# Patient Record
Sex: Female | Born: 1935
Health system: Southern US, Community
[De-identification: ages and names within clinical notes are randomized; demographics above are authoritative.]

## PROBLEM LIST (undated history)

## (undated) DIAGNOSIS — I219 Acute myocardial infarction, unspecified: Secondary | ICD-10-CM

## (undated) DIAGNOSIS — K5790 Diverticulosis of intestine, part unspecified, without perforation or abscess without bleeding: Secondary | ICD-10-CM

## (undated) DIAGNOSIS — M353 Polymyalgia rheumatica: Secondary | ICD-10-CM

## (undated) DIAGNOSIS — Z87442 Personal history of urinary calculi: Secondary | ICD-10-CM

## (undated) DIAGNOSIS — S22079A Unspecified fracture of T9-T10 vertebra, initial encounter for closed fracture: Secondary | ICD-10-CM

## (undated) DIAGNOSIS — I10 Essential (primary) hypertension: Secondary | ICD-10-CM

## (undated) DIAGNOSIS — E039 Hypothyroidism, unspecified: Secondary | ICD-10-CM

## (undated) DIAGNOSIS — A159 Respiratory tuberculosis unspecified: Secondary | ICD-10-CM

## (undated) DIAGNOSIS — M1990 Unspecified osteoarthritis, unspecified site: Secondary | ICD-10-CM

## (undated) DIAGNOSIS — I429 Cardiomyopathy, unspecified: Secondary | ICD-10-CM

## (undated) DIAGNOSIS — J449 Chronic obstructive pulmonary disease, unspecified: Secondary | ICD-10-CM

## (undated) DIAGNOSIS — E119 Type 2 diabetes mellitus without complications: Secondary | ICD-10-CM

## (undated) DIAGNOSIS — I428 Other cardiomyopathies: Secondary | ICD-10-CM

## (undated) DIAGNOSIS — K219 Gastro-esophageal reflux disease without esophagitis: Secondary | ICD-10-CM

## (undated) DIAGNOSIS — Z5189 Encounter for other specified aftercare: Secondary | ICD-10-CM

## (undated) DIAGNOSIS — I639 Cerebral infarction, unspecified: Secondary | ICD-10-CM

## (undated) DIAGNOSIS — IMO0001 Reserved for inherently not codable concepts without codable children: Secondary | ICD-10-CM

## (undated) DIAGNOSIS — I509 Heart failure, unspecified: Secondary | ICD-10-CM

## (undated) HISTORY — DX: Respiratory tuberculosis unspecified: A15.9

## (undated) HISTORY — DX: Chronic obstructive pulmonary disease, unspecified: J44.9

## (undated) HISTORY — DX: Other cardiomyopathies: I42.8

## (undated) HISTORY — DX: Diverticulosis of intestine, part unspecified, without perforation or abscess without bleeding: K57.90

## (undated) HISTORY — DX: Cardiomyopathy, unspecified: I42.9

## (undated) HISTORY — DX: Polymyalgia rheumatica: M35.3

## (undated) HISTORY — DX: Gastro-esophageal reflux disease without esophagitis: K21.9

## (undated) HISTORY — PX: CARDIOVASCULAR STRESS TEST: SHX262

## (undated) HISTORY — DX: Essential (primary) hypertension: I10

---
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
## 1959-01-05 HISTORY — PX: TUBAL LIGATION: SHX77

## 1968-02-03 DIAGNOSIS — A159 Respiratory tuberculosis unspecified: Secondary | ICD-10-CM

## 1968-02-03 HISTORY — DX: Respiratory tuberculosis unspecified: A15.9

## 1997-02-02 DIAGNOSIS — I219 Acute myocardial infarction, unspecified: Secondary | ICD-10-CM

## 1997-02-02 HISTORY — PX: CORONARY ANGIOPLASTY WITH STENT PLACEMENT: SHX49

## 1997-02-02 HISTORY — PX: TRACHEOSTOMY: SUR1362

## 1997-02-02 HISTORY — DX: Acute myocardial infarction, unspecified: I21.9

## 1997-05-23 ENCOUNTER — Encounter: Admission: RE | Admit: 1997-05-23 | Discharge: 1997-05-23 | Payer: Self-pay | Admitting: Internal Medicine

## 1997-05-30 ENCOUNTER — Encounter: Admission: RE | Admit: 1997-05-30 | Discharge: 1997-05-30 | Payer: Self-pay | Admitting: Hematology and Oncology

## 1997-06-28 ENCOUNTER — Encounter: Admission: RE | Admit: 1997-06-28 | Discharge: 1997-06-28 | Payer: Self-pay | Admitting: Internal Medicine

## 1997-12-14 ENCOUNTER — Encounter: Admission: RE | Admit: 1997-12-14 | Discharge: 1997-12-14 | Payer: Self-pay | Admitting: Internal Medicine

## 1998-05-14 ENCOUNTER — Encounter: Admission: RE | Admit: 1998-05-14 | Discharge: 1998-05-14 | Payer: Self-pay | Admitting: Internal Medicine

## 1998-06-12 ENCOUNTER — Encounter: Admission: RE | Admit: 1998-06-12 | Discharge: 1998-06-12 | Payer: Self-pay | Admitting: Internal Medicine

## 1998-07-19 ENCOUNTER — Emergency Department (HOSPITAL_COMMUNITY): Admission: EM | Admit: 1998-07-19 | Discharge: 1998-07-19 | Payer: Self-pay | Admitting: Emergency Medicine

## 1998-07-19 ENCOUNTER — Encounter: Payer: Self-pay | Admitting: Emergency Medicine

## 1998-09-23 ENCOUNTER — Encounter: Admission: RE | Admit: 1998-09-23 | Discharge: 1998-09-23 | Payer: Self-pay | Admitting: Internal Medicine

## 1998-09-23 ENCOUNTER — Ambulatory Visit (HOSPITAL_COMMUNITY): Admission: RE | Admit: 1998-09-23 | Discharge: 1998-09-23 | Payer: Self-pay

## 1998-10-01 ENCOUNTER — Encounter: Payer: Self-pay | Admitting: Emergency Medicine

## 1998-10-01 ENCOUNTER — Emergency Department (HOSPITAL_COMMUNITY): Admission: EM | Admit: 1998-10-01 | Discharge: 1998-10-01 | Payer: Self-pay | Admitting: Emergency Medicine

## 1998-10-04 DIAGNOSIS — E119 Type 2 diabetes mellitus without complications: Secondary | ICD-10-CM

## 1998-10-04 DIAGNOSIS — E1169 Type 2 diabetes mellitus with other specified complication: Secondary | ICD-10-CM | POA: Insufficient documentation

## 1998-10-04 HISTORY — DX: Type 2 diabetes mellitus without complications: E11.9

## 1998-10-09 ENCOUNTER — Ambulatory Visit (HOSPITAL_COMMUNITY): Admission: RE | Admit: 1998-10-09 | Discharge: 1998-10-09 | Payer: Self-pay

## 1998-10-21 ENCOUNTER — Encounter: Admission: RE | Admit: 1998-10-21 | Discharge: 1998-10-21 | Payer: Self-pay | Admitting: Internal Medicine

## 1998-10-29 ENCOUNTER — Encounter: Admission: RE | Admit: 1998-10-29 | Discharge: 1999-01-27 | Payer: Self-pay

## 1998-11-01 ENCOUNTER — Ambulatory Visit (HOSPITAL_COMMUNITY): Admission: RE | Admit: 1998-11-01 | Discharge: 1998-11-01 | Payer: Self-pay

## 1999-03-03 ENCOUNTER — Encounter: Admission: RE | Admit: 1999-03-03 | Discharge: 1999-03-03 | Payer: Self-pay | Admitting: Internal Medicine

## 1999-03-07 ENCOUNTER — Encounter: Payer: Self-pay | Admitting: Emergency Medicine

## 1999-03-07 ENCOUNTER — Emergency Department (HOSPITAL_COMMUNITY): Admission: EM | Admit: 1999-03-07 | Discharge: 1999-03-07 | Payer: Self-pay | Admitting: Emergency Medicine

## 1999-05-19 ENCOUNTER — Encounter: Admission: RE | Admit: 1999-05-19 | Discharge: 1999-05-19 | Payer: Self-pay | Admitting: Internal Medicine

## 1999-08-12 ENCOUNTER — Encounter (INDEPENDENT_AMBULATORY_CARE_PROVIDER_SITE_OTHER): Payer: Self-pay | Admitting: Internal Medicine

## 1999-08-12 ENCOUNTER — Encounter: Admission: RE | Admit: 1999-08-12 | Discharge: 1999-08-12 | Payer: Self-pay | Admitting: Hematology and Oncology

## 1999-08-12 LAB — CONVERTED CEMR LAB: Pap Smear: NORMAL

## 1999-08-19 ENCOUNTER — Encounter: Admission: RE | Admit: 1999-08-19 | Discharge: 1999-08-19 | Payer: Self-pay | Admitting: Internal Medicine

## 1999-09-25 ENCOUNTER — Encounter: Admission: RE | Admit: 1999-09-25 | Discharge: 1999-09-25 | Payer: Self-pay | Admitting: Hematology and Oncology

## 1999-10-27 ENCOUNTER — Encounter: Admission: RE | Admit: 1999-10-27 | Discharge: 1999-10-27 | Payer: Self-pay | Admitting: Internal Medicine

## 1999-11-04 LAB — FECAL OCCULT BLOOD, GUAIAC: Fecal Occult Blood: NEGATIVE

## 1999-11-10 ENCOUNTER — Ambulatory Visit (HOSPITAL_COMMUNITY): Admission: RE | Admit: 1999-11-10 | Discharge: 1999-11-10 | Payer: Self-pay

## 2000-03-23 ENCOUNTER — Inpatient Hospital Stay (HOSPITAL_COMMUNITY): Admission: EM | Admit: 2000-03-23 | Discharge: 2000-03-24 | Payer: Self-pay | Admitting: Emergency Medicine

## 2000-03-23 ENCOUNTER — Encounter: Payer: Self-pay | Admitting: *Deleted

## 2000-03-24 ENCOUNTER — Encounter: Payer: Self-pay | Admitting: Internal Medicine

## 2000-05-28 ENCOUNTER — Inpatient Hospital Stay (HOSPITAL_COMMUNITY): Admission: EM | Admit: 2000-05-28 | Discharge: 2000-05-29 | Payer: Self-pay | Admitting: Emergency Medicine

## 2000-05-28 ENCOUNTER — Encounter: Payer: Self-pay | Admitting: Emergency Medicine

## 2000-06-29 ENCOUNTER — Encounter: Admission: RE | Admit: 2000-06-29 | Discharge: 2000-06-29 | Payer: Self-pay

## 2001-03-18 ENCOUNTER — Encounter: Admission: RE | Admit: 2001-03-18 | Discharge: 2001-03-18 | Payer: Self-pay | Admitting: Internal Medicine

## 2001-04-25 ENCOUNTER — Encounter: Admission: RE | Admit: 2001-04-25 | Discharge: 2001-04-25 | Payer: Self-pay | Admitting: Internal Medicine

## 2001-04-26 ENCOUNTER — Encounter: Admission: RE | Admit: 2001-04-26 | Discharge: 2001-04-26 | Payer: Self-pay | Admitting: Internal Medicine

## 2001-05-05 ENCOUNTER — Ambulatory Visit (HOSPITAL_COMMUNITY): Admission: RE | Admit: 2001-05-05 | Discharge: 2001-05-05 | Payer: Self-pay | Admitting: Internal Medicine

## 2001-06-08 ENCOUNTER — Encounter: Admission: RE | Admit: 2001-06-08 | Discharge: 2001-06-08 | Payer: Self-pay | Admitting: Internal Medicine

## 2001-06-09 ENCOUNTER — Encounter: Admission: RE | Admit: 2001-06-09 | Discharge: 2001-06-09 | Payer: Self-pay | Admitting: Internal Medicine

## 2001-07-15 ENCOUNTER — Encounter: Admission: RE | Admit: 2001-07-15 | Discharge: 2001-07-15 | Payer: Self-pay | Admitting: Internal Medicine

## 2001-07-15 ENCOUNTER — Encounter: Payer: Self-pay | Admitting: Internal Medicine

## 2001-07-15 ENCOUNTER — Ambulatory Visit (HOSPITAL_COMMUNITY): Admission: RE | Admit: 2001-07-15 | Discharge: 2001-07-15 | Payer: Self-pay | Admitting: Internal Medicine

## 2001-07-19 ENCOUNTER — Encounter: Admission: RE | Admit: 2001-07-19 | Discharge: 2001-07-19 | Payer: Self-pay | Admitting: Internal Medicine

## 2001-07-22 ENCOUNTER — Encounter: Admission: RE | Admit: 2001-07-22 | Discharge: 2001-07-22 | Payer: Self-pay | Admitting: Internal Medicine

## 2001-07-26 ENCOUNTER — Encounter: Payer: Self-pay | Admitting: Internal Medicine

## 2001-07-26 ENCOUNTER — Ambulatory Visit (HOSPITAL_COMMUNITY): Admission: RE | Admit: 2001-07-26 | Discharge: 2001-07-26 | Payer: Self-pay | Admitting: Internal Medicine

## 2001-08-10 ENCOUNTER — Encounter: Admission: RE | Admit: 2001-08-10 | Discharge: 2001-08-10 | Payer: Self-pay | Admitting: Internal Medicine

## 2001-08-23 ENCOUNTER — Ambulatory Visit (HOSPITAL_COMMUNITY): Admission: RE | Admit: 2001-08-23 | Discharge: 2001-08-23 | Payer: Self-pay | Admitting: Internal Medicine

## 2001-08-23 ENCOUNTER — Encounter: Payer: Self-pay | Admitting: Internal Medicine

## 2001-09-07 ENCOUNTER — Encounter (HOSPITAL_COMMUNITY): Payer: Self-pay | Admitting: Oncology

## 2001-09-07 ENCOUNTER — Encounter: Admission: RE | Admit: 2001-09-07 | Discharge: 2001-09-07 | Payer: Self-pay | Admitting: Oncology

## 2001-10-04 ENCOUNTER — Encounter: Admission: RE | Admit: 2001-10-04 | Discharge: 2001-10-04 | Payer: Self-pay | Admitting: Internal Medicine

## 2001-10-18 ENCOUNTER — Encounter: Admission: RE | Admit: 2001-10-18 | Discharge: 2001-10-18 | Payer: Self-pay | Admitting: Internal Medicine

## 2001-11-17 ENCOUNTER — Encounter: Admission: RE | Admit: 2001-11-17 | Discharge: 2001-11-17 | Payer: Self-pay | Admitting: Internal Medicine

## 2001-11-23 ENCOUNTER — Encounter: Admission: RE | Admit: 2001-11-23 | Discharge: 2001-11-23 | Payer: Self-pay | Admitting: Internal Medicine

## 2001-12-21 ENCOUNTER — Encounter: Payer: Self-pay | Admitting: Emergency Medicine

## 2001-12-21 ENCOUNTER — Emergency Department (HOSPITAL_COMMUNITY): Admission: EM | Admit: 2001-12-21 | Discharge: 2001-12-21 | Payer: Self-pay | Admitting: Emergency Medicine

## 2002-03-14 ENCOUNTER — Encounter: Admission: RE | Admit: 2002-03-14 | Discharge: 2002-03-14 | Payer: Self-pay | Admitting: Internal Medicine

## 2002-08-30 ENCOUNTER — Encounter: Admission: RE | Admit: 2002-08-30 | Discharge: 2002-08-30 | Payer: Self-pay | Admitting: Internal Medicine

## 2002-11-14 ENCOUNTER — Encounter: Payer: Self-pay | Admitting: *Deleted

## 2002-11-14 ENCOUNTER — Emergency Department (HOSPITAL_COMMUNITY): Admission: EM | Admit: 2002-11-14 | Discharge: 2002-11-14 | Payer: Self-pay | Admitting: *Deleted

## 2002-11-22 ENCOUNTER — Encounter: Admission: RE | Admit: 2002-11-22 | Discharge: 2002-11-22 | Payer: Self-pay | Admitting: Internal Medicine

## 2002-12-21 ENCOUNTER — Encounter: Admission: RE | Admit: 2002-12-21 | Discharge: 2002-12-21 | Payer: Self-pay | Admitting: Internal Medicine

## 2003-01-01 ENCOUNTER — Encounter: Admission: RE | Admit: 2003-01-01 | Discharge: 2003-01-01 | Payer: Self-pay | Admitting: Internal Medicine

## 2003-01-03 ENCOUNTER — Ambulatory Visit (HOSPITAL_COMMUNITY): Admission: RE | Admit: 2003-01-03 | Discharge: 2003-01-03 | Payer: Self-pay | Admitting: *Deleted

## 2003-01-11 LAB — HM COLONOSCOPY: HM Colonoscopy: ABNORMAL

## 2003-06-14 ENCOUNTER — Encounter: Admission: RE | Admit: 2003-06-14 | Discharge: 2003-06-14 | Payer: Self-pay | Admitting: Internal Medicine

## 2003-08-30 ENCOUNTER — Emergency Department (HOSPITAL_COMMUNITY): Admission: EM | Admit: 2003-08-30 | Discharge: 2003-08-30 | Payer: Self-pay | Admitting: Emergency Medicine

## 2003-12-04 ENCOUNTER — Ambulatory Visit: Payer: Self-pay | Admitting: Internal Medicine

## 2003-12-18 ENCOUNTER — Ambulatory Visit: Payer: Self-pay | Admitting: Internal Medicine

## 2004-02-13 ENCOUNTER — Ambulatory Visit: Payer: Self-pay | Admitting: Internal Medicine

## 2004-02-27 ENCOUNTER — Ambulatory Visit: Payer: Self-pay | Admitting: Internal Medicine

## 2004-02-27 ENCOUNTER — Emergency Department (HOSPITAL_COMMUNITY): Admission: EM | Admit: 2004-02-27 | Discharge: 2004-02-27 | Payer: Self-pay

## 2005-04-17 ENCOUNTER — Ambulatory Visit: Payer: Self-pay | Admitting: Internal Medicine

## 2005-08-22 ENCOUNTER — Emergency Department (HOSPITAL_COMMUNITY): Admission: AD | Admit: 2005-08-22 | Discharge: 2005-08-23 | Payer: Self-pay | Admitting: Family Medicine

## 2005-09-23 ENCOUNTER — Ambulatory Visit: Payer: Self-pay | Admitting: Internal Medicine

## 2005-11-30 ENCOUNTER — Ambulatory Visit (HOSPITAL_COMMUNITY): Admission: RE | Admit: 2005-11-30 | Discharge: 2005-11-30 | Payer: Self-pay | Admitting: Internal Medicine

## 2005-11-30 ENCOUNTER — Ambulatory Visit: Payer: Self-pay | Admitting: Internal Medicine

## 2005-12-21 ENCOUNTER — Ambulatory Visit: Payer: Self-pay | Admitting: Internal Medicine

## 2006-01-21 ENCOUNTER — Encounter (INDEPENDENT_AMBULATORY_CARE_PROVIDER_SITE_OTHER): Payer: Self-pay | Admitting: Internal Medicine

## 2006-01-21 DIAGNOSIS — F32A Depression, unspecified: Secondary | ICD-10-CM | POA: Insufficient documentation

## 2006-01-21 DIAGNOSIS — E785 Hyperlipidemia, unspecified: Secondary | ICD-10-CM | POA: Insufficient documentation

## 2006-01-21 DIAGNOSIS — E039 Hypothyroidism, unspecified: Secondary | ICD-10-CM | POA: Insufficient documentation

## 2006-01-21 DIAGNOSIS — F329 Major depressive disorder, single episode, unspecified: Secondary | ICD-10-CM | POA: Insufficient documentation

## 2006-01-21 DIAGNOSIS — I420 Dilated cardiomyopathy: Secondary | ICD-10-CM | POA: Insufficient documentation

## 2006-01-21 DIAGNOSIS — Z8611 Personal history of tuberculosis: Secondary | ICD-10-CM | POA: Insufficient documentation

## 2006-01-21 DIAGNOSIS — I10 Essential (primary) hypertension: Secondary | ICD-10-CM | POA: Insufficient documentation

## 2006-01-21 DIAGNOSIS — M1990 Unspecified osteoarthritis, unspecified site: Secondary | ICD-10-CM | POA: Insufficient documentation

## 2006-01-21 DIAGNOSIS — J449 Chronic obstructive pulmonary disease, unspecified: Secondary | ICD-10-CM | POA: Insufficient documentation

## 2006-01-21 DIAGNOSIS — K219 Gastro-esophageal reflux disease without esophagitis: Secondary | ICD-10-CM | POA: Insufficient documentation

## 2006-01-21 DIAGNOSIS — M353 Polymyalgia rheumatica: Secondary | ICD-10-CM | POA: Insufficient documentation

## 2006-03-05 ENCOUNTER — Telehealth (INDEPENDENT_AMBULATORY_CARE_PROVIDER_SITE_OTHER): Payer: Self-pay | Admitting: *Deleted

## 2006-03-17 ENCOUNTER — Telehealth (INDEPENDENT_AMBULATORY_CARE_PROVIDER_SITE_OTHER): Payer: Self-pay | Admitting: *Deleted

## 2006-04-02 ENCOUNTER — Telehealth: Payer: Self-pay | Admitting: *Deleted

## 2006-04-22 ENCOUNTER — Ambulatory Visit: Payer: Self-pay | Admitting: Internal Medicine

## 2006-04-22 DIAGNOSIS — H6120 Impacted cerumen, unspecified ear: Secondary | ICD-10-CM | POA: Insufficient documentation

## 2006-04-22 DIAGNOSIS — L280 Lichen simplex chronicus: Secondary | ICD-10-CM | POA: Insufficient documentation

## 2006-04-22 DIAGNOSIS — N3946 Mixed incontinence: Secondary | ICD-10-CM | POA: Insufficient documentation

## 2006-04-22 LAB — CONVERTED CEMR LAB
ALT: 9 units/L (ref 0–35)
AST: 12 units/L (ref 0–37)
Albumin: 4.6 g/dL (ref 3.5–5.2)
Alkaline Phosphatase: 114 units/L (ref 39–117)
BUN: 17 mg/dL (ref 6–23)
Bilirubin Urine: NEGATIVE
Bilirubin, Direct: 0.1 mg/dL (ref 0.0–0.3)
Blood Glucose, Fingerstick: 79
Blood in Urine, dipstick: NEGATIVE
CO2: 23 meq/L (ref 19–32)
Calcium: 10.2 mg/dL (ref 8.4–10.5)
Chloride: 105 meq/L (ref 96–112)
Creatinine, Ser: 1.04 mg/dL (ref 0.40–1.20)
Glucose, Bld: 88 mg/dL (ref 70–99)
Glucose, Urine, Semiquant: NEGATIVE
Hgb A1c MFr Bld: 6.6 %
Indirect Bilirubin: 0.4 mg/dL (ref 0.0–0.9)
Ketones, urine, test strip: NEGATIVE
Nitrite: NEGATIVE
Potassium: 4 meq/L (ref 3.5–5.3)
Protein, U semiquant: NEGATIVE
Sodium: 143 meq/L (ref 135–145)
Specific Gravity, Urine: 1.02
TSH: 1.514 microintl units/mL (ref 0.350–5.50)
Total Bilirubin: 0.5 mg/dL (ref 0.3–1.2)
Total Protein: 8 g/dL (ref 6.0–8.3)
Urobilinogen, UA: 0.2
WBC Urine, dipstick: NEGATIVE
pH: 5

## 2006-05-27 ENCOUNTER — Encounter (INDEPENDENT_AMBULATORY_CARE_PROVIDER_SITE_OTHER): Payer: Self-pay | Admitting: Internal Medicine

## 2006-06-29 ENCOUNTER — Emergency Department (HOSPITAL_COMMUNITY): Admission: EM | Admit: 2006-06-29 | Discharge: 2006-06-29 | Payer: Self-pay | Admitting: Family Medicine

## 2006-06-29 ENCOUNTER — Ambulatory Visit: Payer: Self-pay | Admitting: Vascular Surgery

## 2006-06-29 ENCOUNTER — Encounter (INDEPENDENT_AMBULATORY_CARE_PROVIDER_SITE_OTHER): Payer: Self-pay | Admitting: Internal Medicine

## 2006-07-19 ENCOUNTER — Emergency Department (HOSPITAL_COMMUNITY): Admission: EM | Admit: 2006-07-19 | Discharge: 2006-07-19 | Payer: Self-pay | Admitting: Emergency Medicine

## 2006-07-20 ENCOUNTER — Emergency Department (HOSPITAL_COMMUNITY): Admission: EM | Admit: 2006-07-20 | Discharge: 2006-07-20 | Payer: Self-pay | Admitting: Emergency Medicine

## 2006-07-23 ENCOUNTER — Emergency Department (HOSPITAL_COMMUNITY): Admission: EM | Admit: 2006-07-23 | Discharge: 2006-07-23 | Payer: Self-pay | Admitting: Emergency Medicine

## 2006-08-31 ENCOUNTER — Telehealth (INDEPENDENT_AMBULATORY_CARE_PROVIDER_SITE_OTHER): Payer: Self-pay | Admitting: *Deleted

## 2006-09-20 ENCOUNTER — Telehealth (INDEPENDENT_AMBULATORY_CARE_PROVIDER_SITE_OTHER): Payer: Self-pay | Admitting: *Deleted

## 2006-09-24 ENCOUNTER — Telehealth: Payer: Self-pay | Admitting: Infectious Disease

## 2006-10-07 ENCOUNTER — Ambulatory Visit: Payer: Self-pay | Admitting: Internal Medicine

## 2006-10-07 ENCOUNTER — Encounter (INDEPENDENT_AMBULATORY_CARE_PROVIDER_SITE_OTHER): Payer: Self-pay | Admitting: Internal Medicine

## 2006-10-07 DIAGNOSIS — D239 Other benign neoplasm of skin, unspecified: Secondary | ICD-10-CM | POA: Insufficient documentation

## 2006-10-07 LAB — CONVERTED CEMR LAB
ALT: 8 units/L (ref 0–35)
AST: 13 units/L (ref 0–37)
Albumin: 4.4 g/dL (ref 3.5–5.2)
Alkaline Phosphatase: 108 units/L (ref 39–117)
BUN: 13 mg/dL (ref 6–23)
Blood Glucose, Fingerstick: 111
CO2: 27 meq/L (ref 19–32)
Calcium: 9.8 mg/dL (ref 8.4–10.5)
Chloride: 108 meq/L (ref 96–112)
Cholesterol: 185 mg/dL (ref 0–200)
Creatinine, Ser: 0.77 mg/dL (ref 0.40–1.20)
Creatinine, Urine: 190.2 mg/dL
Glucose, Bld: 87 mg/dL (ref 70–99)
HCT: 37.2 % (ref 36.0–46.0)
HDL: 62 mg/dL (ref >39)
Hemoglobin: 12.1 g/dL (ref 12.0–15.0)
Hgb A1c MFr Bld: 6.2 %
LDL Cholesterol: 96 mg/dL (ref 0–99)
MCHC: 32.5 g/dL (ref 30.0–36.0)
MCV: 89.2 fL (ref 78.0–100.0)
Microalb Creat Ratio: 4.9 mg/g (ref 0.0–30.0)
Microalb, Ur: 0.94 mg/dL (ref 0.00–1.89)
Platelets: 197 10*3/uL (ref 150–400)
Potassium: 4.1 meq/L (ref 3.5–5.3)
RBC: 4.17 M/uL (ref 3.87–5.11)
RDW: 15.1 % — ABNORMAL HIGH (ref 11.5–14.0)
Sodium: 147 meq/L — ABNORMAL HIGH (ref 135–145)
TSH: 1.453 microintl units/mL (ref 0.350–5.50)
Total Bilirubin: 0.5 mg/dL (ref 0.3–1.2)
Total CHOL/HDL Ratio: 3
Total Protein: 7.5 g/dL (ref 6.0–8.3)
Triglycerides: 137 mg/dL (ref <150)
VLDL: 27 mg/dL (ref 0–40)
WBC: 5.3 10*3/uL (ref 4.0–10.5)

## 2006-12-22 ENCOUNTER — Telehealth: Payer: Self-pay | Admitting: *Deleted

## 2006-12-28 ENCOUNTER — Telehealth (INDEPENDENT_AMBULATORY_CARE_PROVIDER_SITE_OTHER): Payer: Self-pay | Admitting: Internal Medicine

## 2007-02-03 DIAGNOSIS — I639 Cerebral infarction, unspecified: Secondary | ICD-10-CM

## 2007-02-03 HISTORY — DX: Cerebral infarction, unspecified: I63.9

## 2007-02-17 ENCOUNTER — Encounter (INDEPENDENT_AMBULATORY_CARE_PROVIDER_SITE_OTHER): Payer: Self-pay | Admitting: Internal Medicine

## 2007-02-17 ENCOUNTER — Ambulatory Visit: Payer: Self-pay | Admitting: Internal Medicine

## 2007-02-17 LAB — CONVERTED CEMR LAB
BUN: 9 mg/dL (ref 6–23)
Blood Glucose, Fingerstick: 104
CO2: 29 meq/L (ref 19–32)
Calcium: 9.7 mg/dL (ref 8.4–10.5)
Chloride: 104 meq/L (ref 96–112)
Creatinine, Ser: 0.82 mg/dL (ref 0.40–1.20)
Glucose, Bld: 91 mg/dL (ref 70–99)
Hgb A1c MFr Bld: 6.1 %
Potassium: 3.7 meq/L (ref 3.5–5.3)
Pro B Natriuretic peptide (BNP): <30 pg/mL (ref 0.0–100.0)
Sodium: 138 meq/L (ref 135–145)

## 2007-05-04 HISTORY — PX: EYE SURGERY: SHX253

## 2007-05-17 ENCOUNTER — Inpatient Hospital Stay (HOSPITAL_COMMUNITY): Admission: EM | Admit: 2007-05-17 | Discharge: 2007-05-26 | Payer: Self-pay | Admitting: Emergency Medicine

## 2007-05-18 ENCOUNTER — Ambulatory Visit: Payer: Self-pay | Admitting: Infectious Diseases

## 2007-05-19 ENCOUNTER — Encounter (INDEPENDENT_AMBULATORY_CARE_PROVIDER_SITE_OTHER): Payer: Self-pay | Admitting: *Deleted

## 2007-07-07 ENCOUNTER — Ambulatory Visit: Payer: Self-pay | Admitting: *Deleted

## 2007-07-07 LAB — CONVERTED CEMR LAB
Blood Glucose, Fingerstick: 185
Hgb A1c MFr Bld: 6.3 %

## 2007-07-08 ENCOUNTER — Encounter: Payer: Self-pay | Admitting: Internal Medicine

## 2007-07-08 ENCOUNTER — Ambulatory Visit (HOSPITAL_COMMUNITY): Admission: RE | Admit: 2007-07-08 | Discharge: 2007-07-08 | Payer: Self-pay | Admitting: *Deleted

## 2007-08-04 ENCOUNTER — Emergency Department (HOSPITAL_COMMUNITY): Admission: EM | Admit: 2007-08-04 | Discharge: 2007-08-04 | Payer: Self-pay | Admitting: Emergency Medicine

## 2007-09-14 ENCOUNTER — Telehealth: Payer: Self-pay | Admitting: Internal Medicine

## 2007-09-20 ENCOUNTER — Emergency Department (HOSPITAL_COMMUNITY): Admission: EM | Admit: 2007-09-20 | Discharge: 2007-09-20 | Payer: Self-pay | Admitting: Emergency Medicine

## 2007-09-26 ENCOUNTER — Encounter (INDEPENDENT_AMBULATORY_CARE_PROVIDER_SITE_OTHER): Payer: Self-pay | Admitting: Internal Medicine

## 2007-09-27 ENCOUNTER — Ambulatory Visit: Payer: Self-pay | Admitting: Internal Medicine

## 2007-09-27 DIAGNOSIS — R079 Chest pain, unspecified: Secondary | ICD-10-CM | POA: Insufficient documentation

## 2007-09-27 LAB — CONVERTED CEMR LAB
Blood Glucose, Fingerstick: 120
Hgb A1c MFr Bld: 6.6 %

## 2007-10-04 ENCOUNTER — Telehealth: Payer: Self-pay | Admitting: Internal Medicine

## 2007-10-05 ENCOUNTER — Telehealth (INDEPENDENT_AMBULATORY_CARE_PROVIDER_SITE_OTHER): Payer: Self-pay | Admitting: Internal Medicine

## 2007-10-11 ENCOUNTER — Ambulatory Visit (HOSPITAL_COMMUNITY): Admission: RE | Admit: 2007-10-11 | Discharge: 2007-10-11 | Payer: Self-pay | Admitting: Ophthalmology

## 2007-11-02 ENCOUNTER — Encounter: Payer: Self-pay | Admitting: Internal Medicine

## 2007-11-02 ENCOUNTER — Telehealth: Payer: Self-pay | Admitting: Internal Medicine

## 2007-11-02 ENCOUNTER — Ambulatory Visit: Payer: Self-pay | Admitting: Internal Medicine

## 2007-11-02 LAB — CONVERTED CEMR LAB: Blood Glucose, Fingerstick: 96

## 2007-11-03 ENCOUNTER — Telehealth: Payer: Self-pay | Admitting: Internal Medicine

## 2007-11-03 HISTORY — PX: CATARACT EXTRACTION W/ INTRAOCULAR LENS IMPLANT: SHX1309

## 2007-11-29 ENCOUNTER — Telehealth: Payer: Self-pay | Admitting: Internal Medicine

## 2007-12-05 ENCOUNTER — Telehealth: Payer: Self-pay | Admitting: Internal Medicine

## 2007-12-08 ENCOUNTER — Telehealth: Payer: Self-pay | Admitting: Internal Medicine

## 2007-12-15 ENCOUNTER — Encounter: Payer: Self-pay | Admitting: Internal Medicine

## 2007-12-15 ENCOUNTER — Ambulatory Visit: Payer: Self-pay | Admitting: *Deleted

## 2007-12-15 LAB — CONVERTED CEMR LAB
ALT: 8 units/L (ref 0–35)
AST: 13 units/L (ref 0–37)
Albumin: 4.1 g/dL (ref 3.5–5.2)
Alkaline Phosphatase: 100 units/L (ref 39–117)
BUN: 10 mg/dL (ref 6–23)
Bilirubin, Direct: 0.1 mg/dL (ref 0.0–0.3)
Blood Glucose, Fingerstick: 90
CO2: 26 meq/L (ref 19–32)
Calcium: 9.6 mg/dL (ref 8.4–10.5)
Chloride: 108 meq/L (ref 96–112)
Cholesterol: 155 mg/dL (ref 0–200)
Creatinine, Ser: 0.91 mg/dL (ref 0.40–1.20)
Creatinine, Urine: 160 mg/dL
Glucose, Bld: 92 mg/dL (ref 70–99)
HDL: 57 mg/dL (ref >39)
Hgb A1c MFr Bld: 6.7 %
Indirect Bilirubin: 0.3 mg/dL (ref 0.0–0.9)
LDL Cholesterol: 75 mg/dL (ref 0–99)
Microalb Creat Ratio: 7.6 mg/g (ref 0.0–30.0)
Microalb, Ur: 1.21 mg/dL (ref 0.00–1.89)
Potassium: 4.4 meq/L (ref 3.5–5.3)
Sodium: 144 meq/L (ref 135–145)
Total Bilirubin: 0.4 mg/dL (ref 0.3–1.2)
Total CHOL/HDL Ratio: 2.7
Total Protein: 7.1 g/dL (ref 6.0–8.3)
Triglycerides: 115 mg/dL (ref <150)
VLDL: 23 mg/dL (ref 0–40)

## 2007-12-16 ENCOUNTER — Encounter: Payer: Self-pay | Admitting: Internal Medicine

## 2008-02-03 HISTORY — PX: US ECHOCARDIOGRAPHY: HXRAD669

## 2008-02-03 HISTORY — PX: CORONARY ANGIOPLASTY WITH STENT PLACEMENT: SHX49

## 2008-02-06 ENCOUNTER — Telehealth: Payer: Self-pay | Admitting: Internal Medicine

## 2008-02-06 ENCOUNTER — Telehealth: Payer: Self-pay | Admitting: *Deleted

## 2008-03-27 ENCOUNTER — Telehealth: Payer: Self-pay | Admitting: Internal Medicine

## 2008-04-27 ENCOUNTER — Encounter: Payer: Self-pay | Admitting: Internal Medicine

## 2008-04-27 ENCOUNTER — Ambulatory Visit: Payer: Self-pay | Admitting: *Deleted

## 2008-04-27 LAB — CONVERTED CEMR LAB
BUN: 11 mg/dL (ref 6–23)
Blood Glucose, Fingerstick: 151
CO2: 25 meq/L (ref 19–32)
Calcium: 9.8 mg/dL (ref 8.4–10.5)
Chloride: 108 meq/L (ref 96–112)
Cholesterol, target level: 200 mg/dL
Creatinine, Ser: 0.82 mg/dL (ref 0.40–1.20)
GFR calc Af Amer: >60 mL/min (ref >60)
GFR calc non Af Amer: >60 mL/min (ref >60)
Glucose, Bld: 153 mg/dL — ABNORMAL HIGH (ref 70–99)
HDL goal, serum: 40 mg/dL
Hgb A1c MFr Bld: 6.7 %
LDL Goal: 100 mg/dL
Potassium: 4.1 meq/L (ref 3.5–5.3)
Sodium: 145 meq/L (ref 135–145)
TSH: 1.431 microintl units/mL (ref 0.350–4.500)

## 2008-05-01 ENCOUNTER — Encounter: Payer: Self-pay | Admitting: Internal Medicine

## 2008-05-23 ENCOUNTER — Encounter: Payer: Self-pay | Admitting: Internal Medicine

## 2008-07-24 ENCOUNTER — Telehealth: Payer: Self-pay | Admitting: Internal Medicine

## 2008-08-21 ENCOUNTER — Ambulatory Visit: Payer: Self-pay | Admitting: Internal Medicine

## 2008-08-21 ENCOUNTER — Encounter (INDEPENDENT_AMBULATORY_CARE_PROVIDER_SITE_OTHER): Payer: Self-pay | Admitting: Internal Medicine

## 2008-08-21 ENCOUNTER — Telehealth: Payer: Self-pay | Admitting: *Deleted

## 2008-08-21 DIAGNOSIS — Z8669 Personal history of other diseases of the nervous system and sense organs: Secondary | ICD-10-CM | POA: Insufficient documentation

## 2008-08-21 LAB — CONVERTED CEMR LAB
Blood Glucose, Fingerstick: 175
Hgb A1c MFr Bld: 7.7 %

## 2008-08-22 ENCOUNTER — Encounter (INDEPENDENT_AMBULATORY_CARE_PROVIDER_SITE_OTHER): Payer: Self-pay | Admitting: Internal Medicine

## 2008-08-22 ENCOUNTER — Ambulatory Visit: Payer: Self-pay | Admitting: *Deleted

## 2008-08-22 ENCOUNTER — Ambulatory Visit: Payer: Self-pay | Admitting: Internal Medicine

## 2008-08-22 ENCOUNTER — Encounter: Payer: Self-pay | Admitting: *Deleted

## 2008-08-22 ENCOUNTER — Encounter: Payer: Self-pay | Admitting: Internal Medicine

## 2008-08-22 LAB — CONVERTED CEMR LAB
Cholesterol: 162 mg/dL
HDL: 56 mg/dL
LDL Cholesterol: 90 mg/dL
Triglycerides: 81 mg/dL

## 2008-08-23 ENCOUNTER — Inpatient Hospital Stay (HOSPITAL_COMMUNITY): Admission: AD | Admit: 2008-08-23 | Discharge: 2008-08-25 | Payer: Self-pay | Admitting: Internal Medicine

## 2008-08-25 ENCOUNTER — Encounter (INDEPENDENT_AMBULATORY_CARE_PROVIDER_SITE_OTHER): Payer: Self-pay | Admitting: Internal Medicine

## 2008-09-04 ENCOUNTER — Ambulatory Visit: Payer: Self-pay | Admitting: Internal Medicine

## 2008-09-14 ENCOUNTER — Encounter: Payer: Self-pay | Admitting: *Deleted

## 2008-09-21 ENCOUNTER — Ambulatory Visit (HOSPITAL_COMMUNITY): Admission: RE | Admit: 2008-09-21 | Discharge: 2008-09-21 | Payer: Self-pay | Admitting: Cardiovascular Disease

## 2008-10-25 ENCOUNTER — Ambulatory Visit: Payer: Self-pay | Admitting: Infectious Diseases

## 2008-10-25 ENCOUNTER — Encounter: Payer: Self-pay | Admitting: Internal Medicine

## 2008-10-25 ENCOUNTER — Observation Stay (HOSPITAL_COMMUNITY): Admission: EM | Admit: 2008-10-25 | Discharge: 2008-10-26 | Payer: Self-pay | Admitting: Emergency Medicine

## 2008-10-26 ENCOUNTER — Encounter: Payer: Self-pay | Admitting: Internal Medicine

## 2008-11-05 ENCOUNTER — Ambulatory Visit: Payer: Self-pay | Admitting: Internal Medicine

## 2008-11-05 LAB — CONVERTED CEMR LAB
BUN: 9 mg/dL (ref 6–23)
CO2: 31 meq/L (ref 19–32)
Calcium: 9.7 mg/dL (ref 8.4–10.5)
Chloride: 106 meq/L (ref 96–112)
Creatinine, Ser: 0.75 mg/dL (ref 0.40–1.20)
Glucose, Bld: 144 mg/dL — ABNORMAL HIGH (ref 70–99)
Potassium: 3.7 meq/L (ref 3.5–5.3)
Sodium: 141 meq/L (ref 135–145)

## 2008-11-19 ENCOUNTER — Ambulatory Visit (HOSPITAL_COMMUNITY): Admission: RE | Admit: 2008-11-19 | Discharge: 2008-11-19 | Payer: Self-pay | Admitting: Internal Medicine

## 2008-11-19 ENCOUNTER — Encounter: Payer: Self-pay | Admitting: Internal Medicine

## 2008-11-19 LAB — HM MAMMOGRAPHY: HM Mammogram: NEGATIVE

## 2009-04-05 ENCOUNTER — Telehealth: Payer: Self-pay | Admitting: Internal Medicine

## 2009-04-26 ENCOUNTER — Ambulatory Visit: Payer: Self-pay | Admitting: Internal Medicine

## 2009-04-26 DIAGNOSIS — R252 Cramp and spasm: Secondary | ICD-10-CM | POA: Insufficient documentation

## 2009-04-26 LAB — CONVERTED CEMR LAB
ALT: <8 units/L (ref 0–35)
AST: 12 units/L (ref 0–37)
Albumin: 4.3 g/dL (ref 3.5–5.2)
Alkaline Phosphatase: 88 units/L (ref 39–117)
BUN: 10 mg/dL (ref 6–23)
Blood Glucose, Fingerstick: 149
CO2: 26 meq/L (ref 19–32)
Calcium: 9.6 mg/dL (ref 8.4–10.5)
Chloride: 103 meq/L (ref 96–112)
Creatinine, Ser: 0.78 mg/dL (ref 0.40–1.20)
Free T4: 1.13 ng/dL (ref 0.80–1.80)
Glucose, Bld: 109 mg/dL — ABNORMAL HIGH (ref 70–99)
Hgb A1c MFr Bld: 7.7 %
Potassium: 3.6 meq/L (ref 3.5–5.3)
Sodium: 144 meq/L (ref 135–145)
TSH: 0.855 microintl units/mL (ref 0.350–4.5)
Total Bilirubin: 0.6 mg/dL (ref 0.3–1.2)
Total Protein: 7.2 g/dL (ref 6.0–8.3)

## 2009-08-12 ENCOUNTER — Telehealth: Payer: Self-pay | Admitting: Internal Medicine

## 2009-08-21 ENCOUNTER — Encounter: Payer: Self-pay | Admitting: Internal Medicine

## 2009-08-21 ENCOUNTER — Encounter: Payer: Self-pay | Admitting: *Deleted

## 2009-08-21 ENCOUNTER — Ambulatory Visit: Payer: Self-pay | Admitting: Internal Medicine

## 2009-08-21 DIAGNOSIS — L538 Other specified erythematous conditions: Secondary | ICD-10-CM | POA: Insufficient documentation

## 2009-08-21 LAB — CONVERTED CEMR LAB
Blood Glucose, Fingerstick: 198
Hgb A1c MFr Bld: 7.8 %

## 2009-08-22 LAB — CONVERTED CEMR LAB
ALT: 8 units/L (ref 0–35)
AST: 14 units/L (ref 0–37)
Albumin: 4.2 g/dL (ref 3.5–5.2)
Alkaline Phosphatase: 110 units/L (ref 39–117)
BUN: 10 mg/dL (ref 6–23)
CO2: 26 meq/L (ref 19–32)
Calcium: 9.8 mg/dL (ref 8.4–10.5)
Chloride: 107 meq/L (ref 96–112)
Cholesterol: 185 mg/dL (ref 0–200)
Creatinine, Ser: 0.68 mg/dL (ref 0.40–1.20)
Creatinine, Urine: 158.4 mg/dL
Glucose, Bld: 170 mg/dL — ABNORMAL HIGH (ref 70–99)
HDL: 51 mg/dL (ref >39)
LDL Cholesterol: 109 mg/dL — ABNORMAL HIGH (ref 0–99)
Microalb Creat Ratio: 98.1 mg/g — ABNORMAL HIGH (ref 0.0–30.0)
Microalb, Ur: 15.54 mg/dL — ABNORMAL HIGH (ref 0.00–1.89)
Potassium: 3.3 meq/L — ABNORMAL LOW (ref 3.5–5.3)
Sodium: 145 meq/L (ref 135–145)
TSH: 1.508 microintl units/mL (ref 0.350–4.5)
Total Bilirubin: 0.8 mg/dL (ref 0.3–1.2)
Total CHOL/HDL Ratio: 3.6
Total Protein: 7.3 g/dL (ref 6.0–8.3)
Triglycerides: 124 mg/dL (ref <150)
VLDL: 25 mg/dL (ref 0–40)

## 2009-09-02 ENCOUNTER — Emergency Department (HOSPITAL_COMMUNITY): Admission: EM | Admit: 2009-09-02 | Discharge: 2009-09-02 | Payer: Self-pay | Admitting: Emergency Medicine

## 2009-09-04 ENCOUNTER — Ambulatory Visit: Payer: Self-pay | Admitting: Internal Medicine

## 2009-09-04 DIAGNOSIS — R05 Cough: Secondary | ICD-10-CM

## 2009-09-04 DIAGNOSIS — R930 Abnormal findings on diagnostic imaging of skull and head, not elsewhere classified: Secondary | ICD-10-CM | POA: Insufficient documentation

## 2009-09-04 DIAGNOSIS — Z8639 Personal history of other endocrine, nutritional and metabolic disease: Secondary | ICD-10-CM

## 2009-09-04 DIAGNOSIS — H60339 Swimmer's ear, unspecified ear: Secondary | ICD-10-CM | POA: Insufficient documentation

## 2009-09-04 DIAGNOSIS — R059 Cough, unspecified: Secondary | ICD-10-CM | POA: Insufficient documentation

## 2009-09-04 DIAGNOSIS — Z862 Personal history of diseases of the blood and blood-forming organs and certain disorders involving the immune mechanism: Secondary | ICD-10-CM | POA: Insufficient documentation

## 2009-09-04 LAB — CONVERTED CEMR LAB
BUN: 8 mg/dL (ref 6–23)
Blood Glucose, Fingerstick: 196
CO2: 25 meq/L (ref 19–32)
Calcium: 9.9 mg/dL (ref 8.4–10.5)
Chloride: 104 meq/L (ref 96–112)
Creatinine, Ser: 0.77 mg/dL (ref 0.40–1.20)
Glucose, Bld: 186 mg/dL — ABNORMAL HIGH (ref 70–99)
Magnesium: 1.5 mg/dL (ref 1.5–2.5)
Phosphorus: 2.5 mg/dL (ref 2.3–4.6)
Potassium: 3.6 meq/L (ref 3.5–5.3)
Sodium: 143 meq/L (ref 135–145)

## 2009-09-11 ENCOUNTER — Emergency Department (HOSPITAL_COMMUNITY): Admission: EM | Admit: 2009-09-11 | Discharge: 2009-09-11 | Payer: Self-pay | Admitting: Family Medicine

## 2009-09-13 ENCOUNTER — Telehealth: Payer: Self-pay | Admitting: Internal Medicine

## 2009-09-15 ENCOUNTER — Emergency Department (HOSPITAL_COMMUNITY): Admission: EM | Admit: 2009-09-15 | Discharge: 2009-09-16 | Payer: Self-pay | Admitting: Emergency Medicine

## 2009-10-09 ENCOUNTER — Ambulatory Visit: Payer: Self-pay | Admitting: Internal Medicine

## 2009-10-09 DIAGNOSIS — M25519 Pain in unspecified shoulder: Secondary | ICD-10-CM | POA: Insufficient documentation

## 2009-10-11 ENCOUNTER — Encounter: Payer: Self-pay | Admitting: Internal Medicine

## 2009-10-16 ENCOUNTER — Telehealth (INDEPENDENT_AMBULATORY_CARE_PROVIDER_SITE_OTHER): Payer: Self-pay | Admitting: *Deleted

## 2009-11-01 ENCOUNTER — Ambulatory Visit (HOSPITAL_COMMUNITY): Admission: RE | Admit: 2009-11-01 | Discharge: 2009-11-01 | Payer: Self-pay | Admitting: Ophthalmology

## 2009-11-13 ENCOUNTER — Ambulatory Visit: Payer: Self-pay | Admitting: Internal Medicine

## 2009-11-13 LAB — CONVERTED CEMR LAB
ALT: 8 units/L (ref 0–35)
AST: 11 units/L (ref 0–37)
Albumin: 4.3 g/dL (ref 3.5–5.2)
Alkaline Phosphatase: 94 units/L (ref 39–117)
BUN: 15 mg/dL (ref 6–23)
CO2: 26 meq/L (ref 19–32)
CRP: 0.1 mg/dL (ref <0.6)
Calcium: 9.9 mg/dL (ref 8.4–10.5)
Chloride: 108 meq/L (ref 96–112)
Creatinine, Ser: 0.91 mg/dL (ref 0.40–1.20)
Glucose, Bld: 178 mg/dL — ABNORMAL HIGH (ref 70–99)
Potassium: 4.5 meq/L (ref 3.5–5.3)
Sed Rate: 22 mm/hr (ref 0–22)
Sodium: 144 meq/L (ref 135–145)
Total Bilirubin: 0.7 mg/dL (ref 0.3–1.2)
Total CK: 102 units/L (ref 7–177)
Total Protein: 7.4 g/dL (ref 6.0–8.3)

## 2009-11-22 ENCOUNTER — Telehealth: Payer: Self-pay | Admitting: Internal Medicine

## 2009-11-27 ENCOUNTER — Encounter: Payer: Self-pay | Admitting: Internal Medicine

## 2009-12-23 ENCOUNTER — Ambulatory Visit: Payer: Self-pay | Admitting: Internal Medicine

## 2009-12-23 ENCOUNTER — Telehealth: Payer: Self-pay | Admitting: *Deleted

## 2009-12-23 LAB — CONVERTED CEMR LAB: Blood Glucose, Fingerstick: 96

## 2009-12-23 LAB — HM DIABETES FOOT EXAM

## 2009-12-30 ENCOUNTER — Telehealth: Payer: Self-pay | Admitting: Internal Medicine

## 2010-01-06 ENCOUNTER — Telehealth: Payer: Self-pay | Admitting: Internal Medicine

## 2010-02-04 ENCOUNTER — Encounter: Admission: RE | Admit: 2010-02-04 | Payer: Self-pay | Source: Home / Self Care | Admitting: Internal Medicine

## 2010-02-07 ENCOUNTER — Emergency Department (HOSPITAL_COMMUNITY)
Admission: EM | Admit: 2010-02-07 | Discharge: 2010-02-07 | Payer: Self-pay | Source: Home / Self Care | Admitting: Emergency Medicine

## 2010-02-11 ENCOUNTER — Telehealth: Payer: Self-pay | Admitting: Internal Medicine

## 2010-02-24 ENCOUNTER — Encounter: Payer: Self-pay | Admitting: Internal Medicine
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
## 2010-03-04 NOTE — Progress Notes (Signed)
Summary: REfill/gh  Phone Note Refill Request Message from:  Pharmacy on March 27, 2008 9:24 AM  Refills Requested: Medication #1:  PAXIL 20 MG  TABS Take 1 tablet by mouth once a day  Medication #2:  DOCUSATE SODIUM 100 MG CAPS 1 tablet twice a day.  Medication #3:  [facility] SENNA-EXTRA 17.2 MG TABS take 1 tablet twice a day.  Medication #4:  PRED FORTE 1 % SUSP apply 1 drop 4 times daily Needs 3 month supplies   Method Requested: Electronic Initial call taken by: Angelina Ok RN,  March 27, 2008 9:25 AM  Follow-up for Phone Call        Senna, Docusate, and paxil refilled with 3 month supplies.  Pred forte as per below. Follow-up by: Mariea Stable MD,  March 27, 2008 10:55 AM  Additional Follow-up for Phone Call Additional follow up Details #1::        I assume Dr. Mitzi Davenport (ophthalmologist) is the one who prescribes pred forte.  Could we find out, and if so have them refill it?  Additional Follow-up by: Mariea Stable MD,  March 27, 2008 10:55 AM    Additional Follow-up for Phone Call Additional follow up Details #2::    Call to pt no answer to ask about Eye Drops.  Will return call later. Follow-up by: Angelina Ok RN,  March 27, 2008 4:54 PM    Prescriptions: [facility] SENNA-EXTRA 17.2 MG TABS (SENNOSIDES) take 1 tablet twice a day.  #180 x 2   Entered and Authorized by:   Mariea Stable MD   Signed by:   Mariea Stable MD on 03/27/2008   Method used:   Electronically to        [facility]* (mail-order)       [name]       [place], [place]  [address]       Ph: [phone]       Fax: [phone]   RxID:   [id number] DOCUSATE SODIUM 100 MG CAPS (DOCUSATE SODIUM) 1 tablet twice a day.  #180 x 2   Entered and Authorized by:   Mariea Stable MD   Signed by:   Mariea Stable MD on 03/27/2008   Method used:   Electronically to        [facility]* (mail-order)       [address]  [place], [place]  [address]       Ph: [phone]       Fax: [phone]   RxID:   [id number] PAXIL 20 MG  TABS (PAROXETINE HCL) Take 1 tablet by mouth once a day  #90 x 2   Entered and Authorized by:   Mariea Stable MD   Signed by:   Mariea Stable MD on 03/27/2008   Method used:   Electronically to        [facility]* (mail-order)       [address]       [place], [place]  [address]       Ph: [phone]       Fax: [phone]   RxID:   [id number]

## 2010-03-04 NOTE — Progress Notes (Signed)
Summary: med refill/gp  Phone Note Refill Request Message from:  Fax from Pharmacy on September 13, 2009 2:13 PM  Refills Requested: Medication #1:  AMBIEN 10 MG  TABS Take 1 tablet by mouth once a day   Last Refilled: 08/13/2009 Last appt. Aug. 3.   Method Requested: Telephone to Pharmacy Initial call taken by: Chinita Pester RN,  September 13, 2009 2:13 PM  Follow-up for Phone Call        Refill approved-nurse to complete Follow-up by: Mariea Stable MD,  September 17, 2009 9:51 AM  Additional Follow-up for Phone Call Additional follow up Details #1::        Rx refill request faxed to [facility]. Additional Follow-up by: Chinita Pester RN,  September 17, 2009 12:12 PM    Prescriptions: AMBIEN 10 MG  TABS (ZOLPIDEM TARTRATE) Take 1 tablet by mouth once a day  #31 x 0   Entered and Authorized by:   Mariea Stable MD   Signed by:   Mariea Stable MD on 09/17/2009   Method used:   Telephoned to ...       [facility] [id number]* (retail)       [address] [place], [place]  [address]       Ph: [phone]       Fax: [phone]   RxID:   [id number]

## 2010-03-04 NOTE — Progress Notes (Signed)
Summary: Walk-in/gg  Phone Note Call from Patient   Summary of Call: Pt walked into clinic with c/o pain from rt side of neck to fingers.  Onset 3 weeks ago.  Has taken pain med without relief. strong radial pulse, good color and movement.  No know injury.  No othr c/o  Will see today at 1330 Initial call taken by: Merrie Roof RN,  December 23, 2009 12:20 PM

## 2010-03-04 NOTE — Progress Notes (Signed)
Summary: REfill/gh  Phone Note Refill Request Message from:  Patient on March 27, 2008 9:27 AM  Refills Requested: Medication #1:  DORZOLAMIDE-TIMOLOL 2-0.5 % SOLN 1 drop two times a day in right eye..  Method Requested: Electronic Initial call taken by: Angelina Ok RN,  March 27, 2008 9:27 AM  Follow-up for Phone Call        I assume Dr. Mitzi Davenport (ophthalmologist) is the one who prescribes this medicine.  Could we find out, and if so have them refill it?   Follow-up by: Mariea Stable MD,  March 27, 2008 10:52 AM  Additional Follow-up for Phone Call Additional follow up Details #1::        Call to Dr. Jacelyn Pi office.  They will do the refills on this. Additional Follow-up by: Angelina Ok RN,  April 03, 2008 4:38 PM    Additional Follow-up for Phone Call Additional follow up Details #2::    Thanks Follow-up by: Mariea Stable MD,  April 04, 2008 7:42 AM

## 2010-03-04 NOTE — Miscellaneous (Signed)
Summary: hfu  Clinical Lists Changes "Hospital Discharge  Date of admission:08/21/08  Date of discharge:08/25/08  Brief reason for admission/active problems:Syncope. MRI/MRA negative, TTE positive for atrial septal aneurysm (ASA), EEG negative. No rhythm abnormality on tele. MI ruled out.   Followup needed: Pt. had ASA. It by itself is of no significance. But if it's associated with patent foramen ovale (PFO) in the setting of cryptogenic stroke it is significant. Although pt. didn't have new stroke, will persue bubble study. So pt. needs a TTE with bubble study ordered from the outpatient. Pt's ASA is changed to plavix at d/c and if pt. doesn't have PFO, d/c plavix and restart ASA, otherwise cont. plavix. Pt. has no problem affording plavix. Pt. needs TSH in few weeks as TSH was close to 10 in hospital and it was always normal before and there was some confusion whether the pt. was taking the meds. Lasix d/ced as the pt's EF increased from 45 to 55%. PPI changed to H2 blocker as the pt. is started on plavix.    Patient Instructions: 1)  Please come to the outpatient clinic for a hospital follow up appointment with Dr. Onalee Hua [phone]) on 09/04/08 @ 3. PM.  2)  Limit your Sodium (Salt). 3)  Limit your Sodium (Salt) to less than 2 grams a day(slightly less than 1/2 a teaspoon) to prevent fluid retention, swelling, or worsening of symptoms. 4)  It is important that you exercise regularly at least 20 minutes 5 times a week. If you develop chest pain, have severe difficulty breathing, or feel very tired , stop exercising immediately and seek medical attention. 5)  You need to lose weight. Consider a lower calorie diet and regular exercise.   The medication and problem lists have been updated.  Please see the dictated discharge summary for details." Medications: Removed medication of ASPIRIN EC EXTRA STRENGTH 500 MG TBEC (ASPIRIN) Take 1 tablet by mouth once a day Removed medication of LASIX 40 MG  TABS (FUROSEMIDE) Take 1 tablet by mouth once a day Removed medication of NEXIUM 40 MG  CPDR (ESOMEPRAZOLE MAGNESIUM) Take 1 tablet by mouth once a day Added new medication of PLAVIX 75 MG TABS (CLOPIDOGREL BISULFATE) take 1 pill by mouth daily. - Signed Added new medication of ZANTAC 150 MG TABS (RANITIDINE HCL) take 1 pill by mouth daily. - Signed Rx of ZANTAC 150 MG TABS (RANITIDINE HCL) take 1 pill by mouth daily.;  #30 x 3;  Signed;  Entered by: Jason Coop MD;  Authorized by: Jason Coop MD;  Method used: Electronically to [facility] [id number], [place], [place], [place]  [address], Ph: [phone], Fax: [phone] Rx of PLAVIX 75 MG TABS (CLOPIDOGREL BISULFATE) take 1 pill by mouth daily.;  #30 x 1;  Signed;  Entered by: Jason Coop MD;  Authorized by: Jason Coop MD;  Method used: Electronically to [facility] [id number] [place], [place], [place]  [address], Ph: [phone], Fax: [phone] Observations: Added new observation of INSTRUCTIONS: Please come to the outpatient clinic for a hospital follow up appointment with Dr. Onalee Hua [phone]) on 09/04/08 @ 3. PM.  Limit your Sodium (Salt). Limit your Sodium (Salt) to less than 2 grams a day(slightly less than 1/2 a teaspoon) to prevent fluid retention, swelling, or worsening of symptoms. It is important that you exercise regularly at least 20 minutes 5 times a week. If you develop chest pain, have severe difficulty breathing, or feel  very tired , stop exercising immediately and seek medical attention. You need to lose weight. Consider a lower calorie diet and regular exercise.  (08/25/2008 10:22)    Prescriptions: PLAVIX 75 MG TABS (CLOPIDOGREL BISULFATE) take 1 pill by mouth daily.  #30 x 1   Entered and Authorized by:   Jason Coop MD   Signed by:   Jason Coop MD on 08/25/2008   Method used:   Electronically to        [facility] [id number] (retail)       [address] [place], [place]  [address]       Ph: [phone]       Fax: [phone]   RxID:   [id number] ZANTAC 150 MG TABS (RANITIDINE HCL) take 1 pill by mouth daily.  #30 x 3   Entered and Authorized by:   Jason Coop MD   Signed by:   Jason Coop MD on 08/25/2008   Method used:   Electronically to        [facility] [id number] (retail)       [address] [place], [place]  [address]       Ph: [phone]       Fax: [phone]   RxID:   [id number]

## 2010-03-04 NOTE — Progress Notes (Signed)
  Phone Note Other Incoming Call back at [employer] of Call: Pt requested refills. Her bottles of medication did not match our information.  Unable to reach pt by phone to clarify and to ask her pharmacy. She nolonger uses pharmacy we have listed for her. Initial call taken by: Ballard Russell RN,  September 20, 2006 5:30 PM
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
## 2010-03-04 NOTE — Progress Notes (Signed)
Summary: med refill/gp  Phone Note Refill Request Message from:  Fax from Pharmacy on October 16, 2009 10:52 AM  Refills Requested: Medication #1:  AMBIEN 10 MG  TABS Take 1 tablet by mouth once a day   Last Refilled: 09/17/2009 Last appt. 10/09/09.   Method Requested: Telephone to Pharmacy Initial call taken by: Chinita Pester RN,  October 16, 2009 10:52 AM  Follow-up for Phone Call        Rx called to pharmacy - [facility]. Follow-up by: Chinita Pester RN,  October 21, 2009 8:41 AM    Prescriptions: AMBIEN 10 MG  TABS (ZOLPIDEM TARTRATE) Take 1 tablet by mouth once a day  #31 x 0   Entered and Authorized by:   Zoila Shutter MD   Signed by:   Zoila Shutter MD on 10/18/2009   Method used:   Telephoned to ...       [facility] [id number] (retail)       [address] [place], [place]  [address]       Ph: [phone]       Fax: [phone]   RxID:   [id number]

## 2010-03-04 NOTE — Miscellaneous (Signed)
Summary: [facility]: Diabetic Supplies  [facility]: Diabetic Supplies   Imported By: Florinda Marker 06/18/2008 14:27:33  _____________________________________________________________________  External Attachment:    Type:   Image     Comment:   External Document

## 2010-03-04 NOTE — Progress Notes (Signed)
Summary: Refill/gh  Phone Note Refill Request Message from:  Pharmacy on November 29, 2007 10:25 AM  Refills Requested: Medication #1:  ASPIRIN EC EXTRA STRENGTH 500 MG TBEC Take 1 tablet by mouth once a day   Last Refilled: 11/03/2007  Medication #2:  VICODIN ES 7.5-750 MG TABS take 1 tablet every 6 hour for pain as needed.   Last Refilled: 11/02/2007  Medication #3:  AMBIEN 10 MG  TABS Take 1 tablet by mouth once a day   Last Refilled: 11/03/2007 Pt needs refills on testing strips DC-Nova Max # 50/Box, 30g SMS Lancets # 100   Method Requested: Mail order Initial call taken by: Angelina Ok RN,  November 29, 2007 10:26 AM  Follow-up for Phone Call        Refill approved-nurse to complete.  All refills approved except for vicodin.  Initially prescribed 2 months ago after a fall with rib pain.  Radiographs did not demonstrate any obvious fractures.  She has appointment tomorrow with me.  I will reassess need for opiates at that time.  Follow-up by: Mariea Stable MD,  November 30, 2007 7:15 PM      Prescriptions: ASPIRIN EC EXTRA STRENGTH 500 MG TBEC (ASPIRIN) Take 1 tablet by mouth once a day  #31 x 5   Entered and Authorized by:   Mariea Stable MD   Signed by:   Mariea Stable MD on 11/30/2007   Method used:   Telephoned to ...       [facility] Executive Officer Environmental education officer)       [address]       [place], [place]  [address]       Ph: [phone]       Fax: [phone]   RxID:   [id number] AMBIEN 10 MG  TABS (ZOLPIDEM TARTRATE) Take 1 tablet by mouth once a day  #31 x 3   Entered and Authorized by:   Mariea Stable MD   Signed by:   Mariea Stable MD on 11/30/2007   Method used:   Telephoned to ...       Chief Executive Officer Environmental education officer)       [address]       [place], [place]  [address]       Ph: [phone]       Fax: [phone]   RxID:   [id number] LANCETS 30G   MISC (LANCETS) test twice daily  #1 mo. supply x prn   Entered and Authorized  by:   Mariea Stable MD   Signed by:   Mariea Stable MD on 11/30/2007   Method used:   Telephoned to ...       Chief Executive Officer Environmental education officer)       [address]       [place], [place]  [address]       Ph: [phone]       Fax: [phone]   RxID:   [id number] NOVA MAX GLUCOSE TEST   STRP (GLUCOSE BLOOD) tests twice daily  #1 mo. supply x prn   Entered and Authorized by:   Mariea Stable MD   Signed by:   Mariea Stable MD on 11/30/2007   Method used:   Telephoned to ...       Chief Executive Officer Environmental education officer)       [address]       [place], [place]  [address]       Ph: [phone]  Fax: [phone]   RxID:   [id number]

## 2010-03-04 NOTE — Assessment & Plan Note (Signed)
Summary: EST-1 MONTH F/U VISIT/CH   Vital Signs:  Patient profile:   75 year old female Height:      68.5 inches (173.99 cm) Weight:      218.6 pounds (99.36 kg) BMI:     32.87 Temp:     97.6 degrees F (36.44 degrees C) oral Pulse rate:   79 / minute BP sitting:   151 / 90  (left arm) Cuff size:   regular  Vitals Entered By: Theotis Barrio NT II (October 09, 2009 8:40 AM) CC: RIGHT SHOULDER PAIN GOING DOWN RIGHT ARM # 5  / ON GOING FOR ABOUT A WEEK/ MED REFILL MDI, Depression, Lipid Management Is Patient Diabetic? Yes Did you bring your meter with you today? No Pain Assessment Patient in pain? yes     Location: R/SHOULDER Intensity:     5 Onset of pain  FOR ABOUT A WEEK Nutritional Status BMI of > 30 = obese  Have you ever been in a relationship where you felt threatened, hurt or afraid?No   Does patient need assistance? Functional Status Self care Ambulation Normal Comments SEEN IN THE ED ABOUT 2-3 AGO   Primary Care Provider:  Mariea Stable MD  CC:  RIGHT SHOULDER PAIN GOING DOWN RIGHT ARM # 5  / ON GOING FOR ABOUT A WEEK/ MED REFILL MDI, Depression, and Lipid Management.  History of Present Illness: This is a 75 year old female with PMH consistent with CHF, Depression, DM, Dyspepsia and HTN who presents to today for a 1 month follow up after adding Norvosk and restarting Metformin to her medication list. Her main concern today is right shoulder pain and ear ache which is still present but less painfull then at the last visit.   1. Right shoulder pain: since 2 weeks, worst  pain if lifting the arm especially above the head, resting the arms alleviate the pain, she noted feeling of tingling and numbness in the right arm and hand, tramadol did not relieve the pain, she has not used any other pain medication, she denies trauma, strain, injuries, fevers, chills  2. Otitis externa: completed a 7 days course of by mouth and ear drops of cipro, noted that the pain has  improved significantly put she noted she feels it is coming back  3. BP currently on Norvasc 10 mg, Coreg 12.5 mg BID and Lasix 20 mg, todays BP 151/90 improved compaired to last visit 174/99. At the last was started on Norvask. Patient denies any Headache,chest pain, SOB, palpitation, dizziness  4 . DM: she compliant with her medication. Currently taking Metformin 1000 mg two times a day and Glipizide 2.5 mg, last HgbA1c in July was 7.8   5. Lipids: currently on Simvastatin 80 mg, LDL in July was 109 which has increased since the last time from 90 in July/2010  5. Patient was recommended to follow up with  ENT (Dr Jenne Pane) for the supraglottic fullness noted on CT head done in ED but she was not able to pay her 40 dollars copay therefore she was asked to reschedule the appointment. She was also advised to see an Ophthalmologist but she faced the same problem.      Depression History:      Positive alarm features for depression include fatigue (loss of energy).  However, she denies significant weight loss, significant weight gain, and insomnia.        Suicide risk questions reveal that she wishes that she were dead and she has thought about  ending her life.  The patient denies that she feels like life is not worth living.         Lipid Management History:      Positive NCEP/ATP III risk factors include female age 73 years old or older, diabetes, and hypertension.  Negative NCEP/ATP III risk factors include non-tobacco-user status.           Preventive Screening-Counseling & Management  Alcohol-Tobacco     Alcohol drinks/day: 0     Smoking Status: quit     Year Quit: 1976  Caffeine-Diet-Exercise     Does Patient Exercise: yes     Type of exercise: WALKING  / FISHING  Current Medications (verified): 1)  Synthroid 100 Mcg Tabs (Levothyroxine Sodium) .... Take 1 Tablet By Mouth Once A Day 2)  Amlodipine Besylate 10 Mg Tabs (Amlodipine Besylate) .... Take 1 Tablet By Mouth Once A Day 3)   Coreg 12.5 Mg  Tabs (Carvedilol) .... Take 1 Tablet By Mouth Two Times A Day 4)  Simvastatin 80 Mg  Tabs (Simvastatin) .... Take 1 Tablet By Mouth Once A Day 5)  Nova Max Glucose Test   Strp (Glucose Blood) .... Tests Twice Daily 6)  Lancets 30g   Misc (Lancets) .... Test Twice Daily 7)  Ambien 10 Mg  Tabs (Zolpidem Tartrate) .... Take 1 Tablet By Mouth Once A Day 8)  Paxil 20 Mg  Tabs (Paroxetine Hcl) .... Take 1 Tablet By Mouth Once A Day 9)  Docusate Sodium 100 Mg Caps (Docusate Sodium) .Marland Kitchen.. 1 Tablet Twice A Day. 10)  [facility]vs Senna-Extra 17.2 Mg Tabs (Sennosides) .... Take 1 Tablet Twice A Day. 11)  Plavix 75 Mg Tabs (Clopidogrel Bisulfate) .... Take 1 Pill By Mouth Daily. 12)  Zantac 150 Mg Tabs (Ranitidine Hcl) .... Take 1 Pill By Mouth Twice A Day. 13)  Furosemide 20 Mg Tabs (Furosemide) .... Take 1 Tablet By Mouth Once A Day 14)  Glipizide 2.5 Mg Xr24h-Tab (Glipizide) .... Take 1 Tablet By Mouth Daily. 15)  Tramadol Hcl 50 Mg Tabs (Tramadol Hcl) .... Take 1 Tablet Every 8 Hour For Pain As Needed. 16)  Metformin Hcl 1000 Mg Tabs (Metformin Hcl) .... Take 1 Tablet By Mouth Two Times A Day 17)  Clotrimazole 1 % Crea (Clotrimazole) .... Apply Two Times A Day To Affected Areas 18)  Klor-Con 20 Meq Pack (Potassium Chloride) .... Take 1 Tablet By Mouth Two Times A Day  Allergies: No Known Drug Allergies  Review of Systems       The patient complains of dyspnea on exertion and depression.  The patient denies fever, weight loss, weight gain, chest pain, peripheral edema, hemoptysis, and unusual weight change.    Physical Exam  General:  alert and overweight-appearing.   Head:  Normocephalic and atraumatic without obvious abnormalities. No apparent alopecia or balding. Ears:  left: puss on the TM but non -bulging, no redness, intact TM,  no cerum impaction, some tenderness with exam, R ear normal.   Mouth:  Oral mucosa and oropharynx without lesions or exudate. Neck:  No deformities, masses,  or tenderness noted. Lungs:  Normal respiratory effort, chest expands symmetrically. Lungs are clear to auscultation, no crackles or wheezes. Heart:  Normal rate and regular rhythm. S1 and S2 normal without gallop, murmur, click, rub or other extra sounds. Abdomen:  Bowel sounds positive,abdomen soft and non-tender without masses, organomegaly or hernias noted.  Diabetes Management Exam:    Foot Exam (with socks and/or shoes not present):  Sensory-Monofilament:          Left foot: normal          Right foot: normal   Shoulder/Elbow Exam  General:    Well-developed, well-nourished, normal body habitus; no deformities, normal grooming.    Skin:    Intact, no scars, lesions, rashes, cafe au lait spots or bruising.    Inspection:    Inspection is normal.    Palpation:     tenderness anterolateral shoulder moving down the right chest and moving up the right neck,  tenderness R-deltoid:, and tenderness R-suprascapular: no tenderness on the left arm and shoulder   Vascular:    Radial, ulnar, brachial, and axillary pulses 2+ and symmetric; capillary refill less than 2 seconds; no evidence of ischemia, clubbing, or cyanosis.    Sensory:    Gross sensation intact in the upper extremities.    Motor:    Normal motor strenght in the right shoulder and normal hand grip. Decreased ROM in the right shoulder due to pain: pain with active abduction beyond 90 degress, slight decrease in the external rotation. Normal ROM and strenghth in the left shoulder   Reflexes:    Normal reflexes in the upper extremities.    Shoulder Exam:    Right:    Inspection:  Normal    Palpation:  Normal    Stability:  stable    Swelling:  no    Erythema:  no    Left:    Inspection:  Normal    Palpation:  Normal    Stability:  stable    Tenderness:  no    Swelling:  no    Erythema:  no   Impression & Recommendations:  Problem # 1:  SHOULDER PAIN, RIGHT (ICD-719.41) Right shoulder pain with a  history of acute onset and symptoms and sign including painful arch and slight weakness in external rotation concludes that this may be  rotator cuff tendinopathy. Other DD for anterolateral shoulder pain include impingemetn syndrome, tendon tear but should include a notable loss of stength  and arthritis but this more a chronic process and accompanied with stiffness.  Patient was prescribed Mobic for anti-inflammation, percocet for the acute pain and she was referred to physical therapy.  Her updated medication list for this problem includes:    Tramadol Hcl 50 Mg Tabs (Tramadol hcl) .Marland Kitchen... Take 1 tablet every 8 hour for pain as needed.    Mobic 7.5 Mg Tabs (Meloxicam) .Marland Kitchen... Take one tablet by mouth once a day.    Percocet 5-325 Mg Tabs (Oxycodone-acetaminophen) .Marland Kitchen... Take one tablet by mouth q6hrs as needed for pain  Orders: Physical Therapy Referral (PT)  Problem # 2:  DYSLIPIDEMIA (ICD-272.4) Since patient is on Norvasc the dosage of Zocor was reduced to 20 mg. I recommend to recheck a lipid panel at the next visit and change to a possible more potent statin like Crestor 10 to 20 mg.   Her updated medication list for this problem includes:    Zocor 20 Mg Tabs (Simvastatin) .Marland Kitchen... Take one tablet by mouth at bedtime  Problem # 3:  HYPERTENSION (ICD-401.9) BP  has improved on Norvasc but it is still elevated especially with h/o DM . She would benefit from an ACEi but she is still complaining about episodic cough. Therefore increased Coreg to 25 mg BID. Her heart rate was 79 today.   Her updated medication list for this problem includes:    Amlodipine Besylate 10 Mg Tabs (Amlodipine besylate) .Marland KitchenMarland KitchenMarland KitchenMarland Kitchen  Take 1 tablet by mouth once a day    Coreg 25 Mg Tabs (Carvedilol) .Marland Kitchen... Take one tablet by mouth two times a day    Furosemide 20 Mg Tabs (Furosemide) .Marland Kitchen... Take 1 tablet by mouth once a day  Problem # 4:  OTITIS EXTERNA, ACUTE, LEFT (ICD-380.12) Patient was noted to have some pus on the TM and  some pain during the exam. Possible that the otitis externa was not completly cleared of. Advised Cipro ear drops for 7 days and to follow up with the ENT.  Her updated medication list for this problem includes:    Cipro Hc 0.2-1 % Susp (Ciprofloxacin-hydrocortisone) .Marland KitchenMarland KitchenMarland KitchenMarland Kitchen 3 drops in the left ear two times a day  for 7days..   Problem # 5:  NONSPECIFIC ABN FNDNG RAD & OTH EXM SKULL & HEAD (ICD-793.0) Recommended to follow up with ENT for the supraglottic fullness noted on CT head.   Problem # 6:  DIABETES MELLITUS (ICD-250.00) Recommended to continue on current regimen. We will recheck HgbA1c at the next visit. Advised diet control and excercise on a regular basis.  Her updated medication list for this problem includes:    Glipizide 2.5 Mg Xr24h-tab (Glipizide) .Marland Kitchen... Take 1 tablet by mouth daily.    Metformin Hcl 1000 Mg Tabs (Metformin hcl) .Marland Kitchen... Take 1 tablet by mouth two times a day  Problem # 7:  OSTEOARTHRITIS (ICD-715.90) Patient noted that the pain is not controlled with tramadol. But I think currently the right shoulder pain is the major issue causing pain which seems to be not controlled with tramadol. I mentioned to the patient that the percocet is just for the acute pain and noted that we will reevaluate in a month if any changes has to be done for better pain control for the osteoarthritis.   Her updated medication list for this problem includes:    Tramadol Hcl 50 Mg Tabs (Tramadol hcl) .Marland Kitchen... Take 1 tablet every 8 hour for pain as needed.    Mobic 7.5 Mg Tabs (Meloxicam) .Marland Kitchen... Take one tablet by mouth once a day.    Percocet 5-325 Mg Tabs (Oxycodone-acetaminophen) .Marland Kitchen... Take one tablet by mouth q6hrs as needed for pain  Complete Medication List: 1)  Synthroid 100 Mcg Tabs (Levothyroxine sodium) .... Take 1 tablet by mouth once a day 2)  Amlodipine Besylate 10 Mg Tabs (Amlodipine besylate) .... Take 1 tablet by mouth once a day 3)  Coreg 25 Mg Tabs (Carvedilol) .... Take one  tablet by mouth two times a day 4)  Zocor 20 Mg Tabs (Simvastatin) .... Take one tablet by mouth at bedtime 5)  Nova Max Glucose Test Strp (Glucose blood) .... Tests twice daily 6)  Lancets 30g Misc (Lancets) .... Test twice daily 7)  Ambien 10 Mg Tabs (Zolpidem tartrate) .... Take 1 tablet by mouth once a day 8)  Paxil 20 Mg Tabs (Paroxetine hcl) .... Take 1 tablet by mouth once a day 9)  Docusate Sodium 100 Mg Caps (Docusate sodium) .Marland Kitchen.. 1 tablet twice a day. 10)  Cvs Senna-extra 17.2 Mg Tabs (Sennosides) .... Take 1 tablet twice a day. 11)  Plavix 75 Mg Tabs (Clopidogrel bisulfate) .... Take 1 pill by mouth daily. 12)  Zantac 150 Mg Tabs (Ranitidine hcl) .... Take 1 pill by mouth twice a day. 13)  Furosemide 20 Mg Tabs (Furosemide) .... Take 1 tablet by mouth once a day 14)  Glipizide 2.5 Mg Xr24h-tab (Glipizide) .... Take 1 tablet by mouth daily. 15)  Tramadol  Hcl 50 Mg Tabs (Tramadol hcl) .... Take 1 tablet every 8 hour for pain as needed. 16)  Metformin Hcl 1000 Mg Tabs (Metformin hcl) .... Take 1 tablet by mouth two times a day 17)  Clotrimazole 1 % Crea (Clotrimazole) .... Apply two times a day to affected areas 18)  Klor-con 20 Meq Pack (Potassium chloride) .... Take 1 tablet by mouth two times a day 19)  Mobic 7.5 Mg Tabs (Meloxicam) .... Take one tablet by mouth once a day. 20)  Percocet 5-325 Mg Tabs (Oxycodone-acetaminophen) .... Take one tablet by mouth q6hrs as needed for pain 21)  Cipro Hc 0.2-1 % Susp (Ciprofloxacin-hydrocortisone) .... 3 drops in the left ear two times a day  for 7days.  Lipid Assessment/Plan:      Based on NCEP/ATP III, the patient's risk factor category is "history of diabetes".  The patient's lipid goals are as follows: Total cholesterol goal is 200; LDL cholesterol goal is 100; HDL cholesterol goal is 40; Triglyceride goal is 150.  Her LDL cholesterol goal has been met.    Patient Instructions: 1)  Please schedule a follow-up appointment in 1 month. 2)   Limit your Sodium (Salt). 3)  It is important that you exercise regularly at least 20 minutes 5 times a week. If you develop chest pain, have severe difficulty breathing, or feel very tired , stop exercising immediately and seek medical attention. 4)  You need to lose weight. Consider a lower calorie diet and regular exercise.  5)  Check your blood sugars regularly. If your readings are usually above : or below 70 you should contact our office. 6)  See your eye doctor yearly to check for diabetic eye damage. 7)  Check your Blood Pressure regularly. If it is above: you should make an appointment. Prescriptions: ZOCOR 20 MG TABS (SIMVASTATIN) Take one tablet by mouth at bedtime  #30 x 3   Entered and Authorized by:   Almyra Deforest MD   Signed by:   Almyra Deforest MD on 10/09/2009   Method used:   Print then Give to Patient   RxID:   [id number] CIPRO HC 0.2-1 % SUSP (CIPROFLOXACIN-HYDROCORTISONE) 3 drops in the left ear two times a day  for 7days.  #1 x 0   Entered and Authorized by:   Almyra Deforest MD   Signed by:   Almyra Deforest MD on 10/09/2009   Method used:   Print then Give to Patient   RxID:   [id number] PERCOCET 5-325 MG TABS (OXYCODONE-ACETAMINOPHEN) Take one tablet by mouth q6hrs as needed for pain  #20 x 0   Entered and Authorized by:   Almyra Deforest MD   Signed by:   Almyra Deforest MD on 10/09/2009   Method used:   Print then Give to Patient   RxID:   [id number] MOBIC 7.5 MG TABS (MELOXICAM) Take one tablet by mouth once a day.  #21 x 0   Entered and Authorized by:   Almyra Deforest MD   Signed by:   Almyra Deforest MD on 10/09/2009   Method used:   Print then Give to Patient   RxID:   [id number] MOBIC 7.5 MG TABS (MELOXICAM) Take one tablet by mouth once a day.  #0 x 0   Entered and Authorized by:   Almyra Deforest MD   Signed by:   Almyra Deforest MD on 10/09/2009   Method used:   Print then Give to Patient   RxID:   [id number] PERCOCET 5-325  MG TABS (OXYCODONE-ACETAMINOPHEN) Take one tablet by mouth q6hrs as needed for pain  #0 x 0   Entered and Authorized by:   Almyra Deforest MD   Signed by:   Almyra Deforest MD on 10/09/2009   Method used:   Print then Give to Patient   RxID:   [id number] CIPRO HC 0.2-1 % SUSP (CIPROFLOXACIN-HYDROCORTISONE) 3 drops in the left ear two times a day  for 7days.  #0 x 0   Entered and Authorized by:   Almyra Deforest MD   Signed by:   Almyra Deforest MD on 10/09/2009   Method used:   Print then Give to Patient   RxID:   [id number] ZOCOR 20 MG TABS (SIMVASTATIN) Take one tablet by mouth at bedtime  #30 x 3   Entered and Authorized by:   Almyra Deforest MD   Signed by:   Almyra Deforest MD on 10/09/2009   Method used:   Print then Give to Patient   RxID:   [id number] COREG 25 MG TABS (CARVEDILOL) Take one tablet by mouth two times a day  #60 x 3   Entered and Authorized by:   Almyra Deforest MD   Signed by:   Almyra Deforest MD on 10/09/2009   Method used:   Print then Give to Patient   RxID:   [id number]    Prevention & Chronic Care Immunizations   Influenza vaccine: refuses  (12/15/2007)   Influenza vaccine deferral: Not available  (08/21/2009)    Tetanus booster: Not documented   Td booster deferral: Deferred  (08/21/2009)    Pneumococcal vaccine: Historical  (08/02/2005)   Pneumococcal vaccine deferral: Not indicated  (08/21/2009)    H. zoster vaccine: Not documented   H. zoster vaccine deferral: Not available  (08/21/2009)  Colorectal Screening   Hemoccult: Negative  (11/04/1999)   Hemoccult action/deferral: Deferred  (09/04/2008)    Colonoscopy: Abnormal  (01/11/2003)  Other Screening   Pap smear: Normal  (08/12/1999)   Pap smear action/deferral: Not indicated-other  (08/21/2009)    Mammogram: ASSESSMENT: Negative - BI-RADS 1^MM DIGITAL SCREENING  (11/19/2008)   Mammogram action/deferral: Ordered  (09/04/2008)    DXA bone density scan: Hip Total: T  Score > -1.0 Hip.    (11/19/2008)   DXA bone density action/deferral: Ordered  (09/04/2008)   Smoking status: quit  (10/09/2009)  Diabetes Mellitus   HgbA1C: 7.8  (08/21/2009)   Hemoglobin A1C due: 09    Eye exam: Not documented    Foot exam: yes  (10/09/2009)   Foot exam action/deferral: Do today   High risk foot: No  (10/07/2006)   Foot care education: Done  (10/07/2006)    Urine microalbumin/creatinine ratio: 98.1  (08/21/2009)   Urine microalbumin action/deferral: Ordered    Diabetes flowsheet reviewed?: Yes   Progress toward A1C goal: Unchanged  Lipids   Total Cholesterol: 185  (08/21/2009)   Lipid panel action/deferral: Lipid Panel ordered   LDL: 109  (08/21/2009)   LDL Direct: Not documented   HDL: 51  (08/21/2009)   Triglycerides: 124  (08/21/2009)    SGOT (AST): 14  (08/21/2009)   BMP action: Ordered   SGPT (ALT): 8  (08/21/2009)   Alkaline phosphatase: 110  (08/21/2009)   Total bilirubin: 0.8  (08/21/2009)    Lipid flowsheet reviewed?: Yes   Progress toward LDL goal: Deteriorated  Hypertension   Last Blood Pressure: 151 / 90  (10/09/2009)   Serum creatinine: 0.77  (09/04/2009)   BMP action: Ordered   Serum potassium  3.6  (09/04/2009)    Hypertension flowsheet reviewed?: Yes   Progress toward BP goal: Improved  Self-Management Support :   Personal Goals (by the next clinic visit) :     Personal A1C goal: 7  (04/26/2009)     Personal blood pressure goal: 130/80  (04/26/2009)     Personal LDL goal: 100  (04/26/2009)    Patient will work on the following items until the next clinic visit to reach self-care goals:     Medications and monitoring: take my medicines every day, check my blood sugar, bring all of my medications to every visit, examine my feet every day  (10/09/2009)     Eating: drink diet soda or water instead of juice or soda, eat more vegetables, eat foods that are low in salt, eat baked foods instead of fried foods, limit or avoid alcohol   (10/09/2009)     Activity: take a 30 minute walk every day  (10/09/2009)     Other: FISHING  (11/05/2008)    Diabetes self-management support: Resources for patients handout  (10/09/2009)    Hypertension self-management support: Resources for patients handout  (10/09/2009)    Lipid self-management support: Resources for patients handout  (10/09/2009)        Resource handout printed.   Last LDL:                                                 109 (08/21/2009 8:24:00 PM)        Diabetic Foot Exam Foot Inspection Is there a history of a foot ulcer?              No Is there a foot ulcer now?              No Can the patient see the bottom of their feet?          Yes Are the shoes appropriate in style and fit?          Yes Is there swelling or an abnormal foot shape?          No Are the toenails long?                No Are the toenails thick?                No Are the toenails ingrown?              No Is there heavy callous build-up?              No Is there a claw toe deformity?                          No Is there elevated skin temperature?            No Is there limited ankle dorsiflexion?            Yes Is there foot or ankle muscle weakness?            No Do you have pain in calf while walking?           Yes         10-g (5.07) Semmes-Weinstein Monofilament Test Performed by: Theotis Barrio NT II          Right Foot  Left Foot Visual Inspection     normal           normal Test Control      normal         normal Site 1         normal         normal Site 2         normal         normal Site 3         normal         normal Site 4         normal         normal Site 5         normal         normal Site 6         normal         normal Site 7         normal         normal Site 8         normal         normal Site 9         normal         normal Site 10         normal         normal  Impression      normal         normal  Legend:  Site 1 = Plantar aspect  of first toe (center of pad) Site 2 = Plantar aspect of third toe (center of pad) Site 3 = Plantar aspect of fifth toe (center of pad) Site 4 = Plantar aspect of first metatarsal head Site 5 = Plantar aspect of third metatarsal head Site 6 = Plantar aspect of fifth metatarsal head Site 7 = Plantar aspect of medial midfoot Site 8 = Plantar aspect of lateral midfoot Site 9 = Plantar aspect of heel Site 10 = dorsal aspect of foot between the base of the first and second toes   Result is Abnormal if patient was unable to perceive the monofilament at site indicated.    Appended Document: EST-1 MONTH F/U VISIT/CH I discussed Ms Cruzen with Dr Loistine Chance and I agree with her HPI and A/P. Ms Woldt has been on Percocet previously and Dr Loistine Chance is giving for short term only to control acute shoulder pain.
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
## 2010-03-04 NOTE — Assessment & Plan Note (Signed)
Summary: est-ck/fu/meds/cfb   Vital Signs:  Patient profile:   75 year old female Height:      68.5 inches (173.99 cm) Weight:      213.3 pounds (96.95 kg) BMI:     32.08 Temp:     98.3 degrees F (36.83 degrees C) oral Pulse rate:   70 / minute BP sitting:   167 / 97  (right arm)  Vitals Entered By: Filomena Jungling NT II (April 27, 2008 9:27 AM)  Serial Vital Signs/Assessments:  Time      Position  BP       Pulse  Resp  Temp     By                     162/100                        Mariea Stable MD  Is Patient Diabetic? Yes  Nutritional Status BMI of > 30 = obese CBG Result 151  Have you ever been in a relationship where you felt threatened, hurt or afraid?No   Does patient need assistance? Functional Status Self care Ambulation Normal   Primary Care Provider:  Mariea Stable MD   History of Present Illness: Caitlyn Aguirre is a 75 yo woman with PMH as outlined in chart.  She is here today for routine follow up.  She denies any new complaints.  Her right sided rib pain has improved but still present.  She states that the pain pills she was given helped her quality of life tremendously since it helped both that pain and her knees.  She has been taking tylenol since about December or so since she has not had refills of pain meds.  However, she has now stopped since it is of no help.  She denies any other active problems.    Lipid Management History:      Positive NCEP/ATP III risk factors include female age 73 years old or older, diabetes, and hypertension.  Negative NCEP/ATP III risk factors include non-tobacco-user status.     Preventive Screening-Counseling & Management     Smoking Status: quit     Year Quit: 1976     Does Patient Exercise: no  Current Medications (verified): 1)  Synthroid 100 Mcg Tabs (Levothyroxine Sodium) .... Take 1 Tablet By Mouth Once A Day 2)  Aspirin Ec Extra Strength 500 Mg Tbec (Aspirin) .... Take 1 Tablet By Mouth Once A Day 3)  Lasix 40 Mg Tabs  (Furosemide) .... Take 1 Tablet By Mouth Once A Day 4)  Lotensin 10 Mg Tabs (Benazepril Hcl) .... Take 1 Tablet By Mouth Once A Day 5)  Coreg 12.5 Mg  Tabs (Carvedilol) .... Take 1 Tablet By Mouth Two Times A Day 6)  Simvastatin 80 Mg  Tabs (Simvastatin) .... Take 1 Tablet By Mouth Once A Day 7)  Glucophage 850 Mg Tabs (Metformin Hcl) .... Take 1 Tablet By Mouth Two Times A Day 8)  Nexium 40 Mg  Cpdr (Esomeprazole Magnesium) .... Take 1 Tablet By Mouth Once A Day 9)  Nova Max Glucose Test   Strp (Glucose Blood) .... Tests Twice Daily 10)  Lancets 30g   Misc (Lancets) .... Test Twice Daily 11)  Ambien 10 Mg  Tabs (Zolpidem Tartrate) .... Take 1 Tablet By Mouth Once A Day 12)  Vicodin Es 7.5-750 Mg Tabs (Hydrocodone-Acetaminophen) .... Take 1 Tablet Every 6 Hour For Pain As Needed. 13)  Paxil  20 Mg  Tabs (Paroxetine Hcl) .... Take 1 Tablet By Mouth Once A Day 14)  Docusate Sodium 100 Mg Caps (Docusate Sodium) .Marland Kitchen.. 1 Tablet Twice A Day. 15)  [facility] Senna-Extra 17.2 Mg Tabs (Sennosides) .... Take 1 Tablet Twice A Day.  Allergies: 1)  ! Codeine  Social History:    negative x 3  Review of Systems      See HPI CV:  Denies chest pain or discomfort, shortness of breath with exertion, and swelling of feet. Resp:  Denies cough and shortness of breath. GI:  Denies abdominal pain, bloody stools, change in bowel habits, dark tarry stools, nausea, and vomiting. GU:  Denies dysuria. Neuro:  Denies numbness and weakness.  Physical Exam  General:  alert, well-nourished, normal appearance, cooperative to examination, and overweight-appearing.  alert, well-nourished, normal appearance, cooperative to examination, and overweight-appearing.   Head:  normocephalic and atraumatic.  normocephalic and atraumatic.   Eyes:  anicteric Ears:  R ear normal and L ear normal.  R ear normal and L ear normal.   Mouth:  pharynx pink and moist.   Neck:  supple, no JVD, and no carotid bruits.   Lungs:  normal respiratory  effort, no accessory muscle use, normal breath sounds, no crackles, and no wheezes.   Heart:  normal rate, regular rhythm, no murmur, no gallop, no rub, and no JVD.   Abdomen:  soft, non-tender, normal bowel sounds, and no distention.   Msk:  normal ROM, no joint tenderness, no joint swelling, and no joint warmth (over bilateral knees) Extremities:  no edema Neurologic:  alert & oriented X3, cranial nerves II-XII intact, strength normal in all extremities, sensation intact to light touch, and gait normal.   Psych:  Oriented X3, memory intact for recent and remote, normally interactive, and good eye contact.     Impression & Recommendations:  Problem # 1:  RIB PAIN, RIGHT SIDED (ICD-786.50) Improved.  Problem # 2:  HYPERTENSION (ICD-401.9) BP well controlled in past, elevated today. States she did not take her morning meds today. Will recheck on following visits before making adjustments since she has been at goal during all other visits.   Her updated medication list for this problem includes:    Lasix 40 Mg Tabs (Furosemide) .Marland Kitchen... Take 1 tablet by mouth once a day    Lotensin 10 Mg Tabs (Benazepril hcl) .Marland Kitchen... Take 1 tablet by mouth once a day    Coreg 12.5 Mg Tabs (Carvedilol) .Marland Kitchen... Take 1 tablet by mouth two times a day  BP today: 167/97 Prior BP: 111/65 (12/15/2007)  10 Yr Risk Heart Disease: 20 %  Labs Reviewed: K+: 4.4 (12/15/2007) Creat: : 0.91 (12/15/2007)   Chol: 155 (12/15/2007)   HDL: 57 (12/15/2007)   LDL: 75 (12/15/2007)   TG: 115 (12/15/2007)  Her updated medication list for this problem includes:    Lasix 40 Mg Tabs (Furosemide) .Marland Kitchen... Take 1 tablet by mouth once a day    Lotensin 10 Mg Tabs (Benazepril hcl) .Marland Kitchen... Take 1 tablet by mouth once a day    Coreg 12.5 Mg Tabs (Carvedilol) .Marland Kitchen... Take 1 tablet by mouth two times a day  Orders: T-Basic Metabolic Panel [id number])  Problem # 3:  DIABETES MELLITUS (ICD-250.00) A1c 6.7, continues at goal. No changes at  this time.  Her updated medication list for this problem includes:    Aspirin Ec Extra Strength 500 Mg Tbec (Aspirin) .Marland Kitchen... Take 1 tablet by mouth once a day    Lotensin  10 Mg Tabs (Benazepril hcl) .Marland Kitchen... Take 1 tablet by mouth once a day    Glucophage 850 Mg Tabs (Metformin hcl) .Marland Kitchen... Take 1 tablet by mouth two times a day  Orders: T- Capillary Blood Glucose (16109) T-Hgb A1C (in-house) ([id number])  Labs Reviewed: Creat: 0.91 (12/15/2007)    Reviewed HgBA1c results: 6.7 (04/27/2008)  6.7 (12/15/2007)  Problem # 4:  DYSLIPIDEMIA (ICD-272.4) At goal.  Her updated medication list for this problem includes:    Simvastatin 80 Mg Tabs (Simvastatin) .Marland Kitchen... Take 1 tablet by mouth once a day  Labs Reviewed: SGOT: 13 (12/15/2007)   SGPT: 8 (12/15/2007)  Lipid Goals: Chol Goal: 200 (04/27/2008)   HDL Goal: 40 (04/27/2008)   LDL Goal: 100 (04/27/2008)   TG Goal: 150 (04/27/2008)  10 Yr Risk Heart Disease: 20 %  Problem # 5:  OSTEOARTHRITIS (ICD-715.90) Will give new script for vicodin as this helped her quality of life. Tylenol is not helpful. NSAIDs will increase likelyhood of gastritis (already takes PPI) and may worsen HTN.  Her updated medication list for this problem includes:    Aspirin Ec Extra Strength 500 Mg Tbec (Aspirin) .Marland Kitchen... Take 1 tablet by mouth once a day    Vicodin Es 7.5-750 Mg Tabs (Hydrocodone-acetaminophen) .Marland Kitchen... Take 1 tablet every 6 hour for pain as needed.  Problem # 6:  HYPOTHYROIDISM NOS (ICD-244.9)  Will check TSH today.  Her updated medication list for this problem includes:    Synthroid 100 Mcg Tabs (Levothyroxine sodium) .Marland Kitchen... Take 1 tablet by mouth once a day  Orders: T-TSH ([id number])  Complete Medication List: 1)  Synthroid 100 Mcg Tabs (Levothyroxine sodium) .... Take 1 tablet by mouth once a day 2)  Aspirin Ec Extra Strength 500 Mg Tbec (Aspirin) .... Take 1 tablet by mouth once a day 3)  Lasix 40 Mg Tabs (Furosemide) .... Take 1 tablet by  mouth once a day 4)  Lotensin 10 Mg Tabs (Benazepril hcl) .... Take 1 tablet by mouth once a day 5)  Coreg 12.5 Mg Tabs (Carvedilol) .... Take 1 tablet by mouth two times a day 6)  Simvastatin 80 Mg Tabs (Simvastatin) .... Take 1 tablet by mouth once a day 7)  Glucophage 850 Mg Tabs (Metformin hcl) .... Take 1 tablet by mouth two times a day 8)  Nexium 40 Mg Cpdr (Esomeprazole magnesium) .... Take 1 tablet by mouth once a day 9)  Nova Max Glucose Test Strp (Glucose blood) .... Tests twice daily 10)  Lancets 30g Misc (Lancets) .... Test twice daily 11)  Ambien 10 Mg Tabs (Zolpidem tartrate) .... Take 1 tablet by mouth once a day 12)  Vicodin Es 7.5-750 Mg Tabs (Hydrocodone-acetaminophen) .... Take 1 tablet every 6 hour for pain as needed. 13)  Paxil 20 Mg Tabs (Paroxetine hcl) .... Take 1 tablet by mouth once a day 14)  Docusate Sodium 100 Mg Caps (Docusate sodium) .Marland Kitchen.. 1 tablet twice a day. 15)  [facility] Senna-extra 17.2 Mg Tabs (Sennosides) .... Take 1 tablet twice a day.  Lipid Assessment/Plan:      Based on NCEP/ATP III, the patient's risk factor category is "history of diabetes".  From this information, the patient's calculated lipid goals are as follows: Total cholesterol goal is 200; LDL cholesterol goal is 100; HDL cholesterol goal is 40; Triglyceride goal is 150.  Her LDL cholesterol goal has been met.    Patient Instructions: 1)  Please schedule a follow-up appointment in 3 months. 2)  Your blood pressure was high  today, probably because you have not taken your medicines today.  Will recheck next visit, no changes today. 3)  Your diabetes and cholesterol are well controlled. 4)  Will need mammogram at next visit, around June. 5)  If you have any other problems before your next visit, call clinic.  Prescriptions: VICODIN ES 7.5-750 MG TABS (HYDROCODONE-ACETAMINOPHEN) take 1 tablet every 6 hour for pain as needed.  #90 x 2   Entered and Authorized by:   Mariea Stable MD   Signed by:    Mariea Stable MD on 04/27/2008   Method used:   Print then Give to Patient   RxID:   [id number]   Laboratory Results   Blood Tests   Date/Time Received: April 27, 2008 10:07 AM  Date/Time Reported: Oren Beckmann  April 27, 2008 10:07 AM   HGBA1C: 6.7%   (Normal Range: Non-Diabetic - 3-6%   Control Diabetic - 6-8%) CBG Random:: 151mg /dL

## 2010-03-04 NOTE — Progress Notes (Signed)
Summary: refill/ hla  Phone Note Refill Request Message from:  Fax from Pharmacy on April 02, 2006 3:18 PM  Refills Requested: Medication #1:  GLUCOPHAGE 850 MG TABS Take 1 tablet by mouth two times a day   Last Refilled: 03/05/2006 Initial call taken by: Marin Roberts RN,  April 02, 2006 3:19 PM  Follow-up for Phone Call        Refill approved-nurse to complete.  Patient has appointment 3/19 with PCP Dr. Darrick Huntsman. Follow-up by: Margarito Liner MD,  April 02, 2006 4:07 PM  Additional Follow-up for Phone Call Additional follow up Details #1::        Rx faxed to pharmacy Additional Follow-up by: Marin Roberts RN,  April 02, 2006 4:36 PM    Prescriptions: GLUCOPHAGE 850 MG TABS (METFORMIN HCL) Take 1 tablet by mouth two times a day  #62 x 0   Entered and Authorized by:   Margarito Liner MD   Signed by:   Margarito Liner MD on 04/02/2006   Method used:   Telephoned to ...       [facility]       [address]       [place], [place]  [address]       Ph: [phone]       Fax: [phone]   RxID:   [id number]

## 2010-03-04 NOTE — Assessment & Plan Note (Signed)
Summary: Shoulder and rib pain feels bad, aching   Vital Signs:  Patient Profile:   75 Years Old Female Height:     68.5 inches (173.99 cm) Weight:      203.7 pounds (92.59 kg) BMI:     30.63 Temp:     97.9 degrees F (36.61 degrees C) oral Pulse rate:   71 / minute BP sitting:   131 / 80  (right arm)  Pt. in pain?   yes    Location:   h/a and low right chest    Intensity:   5-6  Vitals Entered By: Caitlyn Kidney Ditzler RN (November 02, 2007 2:32 PM)              Is Patient Diabetic? Yes  Nutritional Status BMI of > 30 = obese Nutritional Status Detail appetite down CBG Result 96  Have you ever been in a relationship where you felt threatened, hurt or afraid?denies   Does patient need assistance? Functional Status Self care Ambulation Normal Comments Daughter helps pt.     PCP:  Mariea Stable MD  Chief Complaint:  Larey Seat 1 month ago - went to ER - cont to have h/a and low right chest and back pain. Hurts with movement..  History of Present Illness: Caitlyn Aguirre is a 75 yo woman with PMH as outlined in chart.  She is here today for HA and continued right lower ribs.  Was seen in [facility] with x-rays, no fractures seen.  States it is similar, was better, and now worse again.  Pain waxes and wanes and "grabs" her.  Has not noticed any changes with food.  Some nausea, no vomiting, abdominal pain, changes in bowel habits, hematochezia or melena.      Updated Prior Medication List: SYNTHROID 100 MCG TABS (LEVOTHYROXINE SODIUM) Take 1 tablet by mouth once a day ASPIRIN EC EXTRA STRENGTH 500 MG TBEC (ASPIRIN) Take 1 tablet by mouth once a day LASIX 40 MG TABS (FUROSEMIDE) Take 1 tablet by mouth once a day LOTENSIN 10 MG TABS (BENAZEPRIL HCL) Take 1 tablet by mouth once a day COREG 12.5 MG  TABS (CARVEDILOL) Take 1 tablet by mouth two times a day SIMVASTATIN 80 MG  TABS (SIMVASTATIN) Take 1 tablet by mouth once a day GLUCOPHAGE 850 MG TABS (METFORMIN HCL) Take 1 tablet by mouth two times a  day NEXIUM 40 MG  CPDR (ESOMEPRAZOLE MAGNESIUM) Take 1 tablet by mouth once a day NOVA MAX GLUCOSE TEST   STRP (GLUCOSE BLOOD) tests twice daily LANCETS 30G   MISC (LANCETS) test twice daily AMBIEN 10 MG  TABS (ZOLPIDEM TARTRATE) Take 1 tablet by mouth once a day VICODIN ES 7.5-750 MG TABS (HYDROCODONE-ACETAMINOPHEN) take 1 tablet every 6 hour for pain as needed. PAXIL 20 MG  TABS (PAROXETINE HCL) Take 1 tablet by mouth once a day DOCUSATE SODIUM 100 MG CAPS (DOCUSATE SODIUM) 1 tablet twice a day. [facility] SENNA-EXTRA 17.2 MG TABS (SENNOSIDES) take 1 tablet twice a day. PRED FORTE 1 % SUSP (PREDNISOLONE ACETATE) apply 1 drop 4 times daily DORZOLAMIDE-TIMOLOL 2-0.5 % SOLN (DORZOLAMIDE-TIMOLOL) 1 drop two times a day in right eye.  Current Allergies (reviewed today): ! CODEINE    Risk Factors: Tobacco use:  quit    Year quit:  1976 Alcohol use:  no Exercise:  no Seatbelt use:  100 %  Colonoscopy History:    Date of Last Colonoscopy:  01/11/2003  Mammogram History:    Date of Last Mammogram:  01/03/2003  PAP Smear  History:    Date of Last PAP Smear:  08/12/1999   Review of Systems      See HPI   Physical Exam  General:     alert, well-developed, well-nourished, normal appearance, and overweight-appearing.   Head:     normocephalic and atraumatic.   Eyes:     anicteric Neck:     supple, no JVD, and no carotid bruits.   Chest Wall:     TTP over inferior ribs anteriorly and laterally.  No rash. Lungs:     normal respiratory effort, no accessory muscle use, normal breath sounds, no crackles, and no wheezes.   Heart:     normal rate, regular rhythm, no gallop, no rub, and no JVD.  Grade 1/6 SEM over outflow track. Abdomen:     soft, non-tender, normal bowel sounds, no distention, no masses, no guarding, no rigidity, no rebound tenderness, no hepatomegaly, and no splenomegaly.   Msk:     TTP over inferior ribs anteriorly and laterally.  No rash. Extremities:     no  edema Neurologic:     alert & oriented X3, cranial nerves II-XII intact, strength normal in all extremities, sensation intact to light touch, and gait normal.   Skin:     no rash Psych:     Oriented X3, memory intact for recent and remote, normally interactive, good eye contact, not anxious appearing, and not depressed appearing.      Impression & Recommendations:  Problem # 1:  RIB PAIN, RIGHT SIDED (ICD-786.50) Assume this is from fall and possibly small fx not seen on CXR since it was negative.  Other possibilities include shingles but not typical description of pain and no rash and waxes/wanes and worse with movement.  Cholecystitis also in differential, especially with radiation to left shoulder blade, however, abdominal exam completely benign including RUQ without a Murphy's sign, pain not associated with food and again, worse with movement.  Will consider further workup if not improved.    Problem # 2:  DIABETES MELLITUS (ICD-250.00) Only on glucophage with good control.  A1c at target <7.  No changes.  Her updated medication list for this problem includes:    Aspirin Ec Extra Strength 500 Mg Tbec (Aspirin) .Marland Kitchen... Take 1 tablet by mouth once a day    Lotensin 10 Mg Tabs (Benazepril hcl) .Marland Kitchen... Take 1 tablet by mouth once a day    Glucophage 850 Mg Tabs (Metformin hcl) .Marland Kitchen... Take 1 tablet by mouth two times a day  Orders: Capillary Blood Glucose (04540) Fingerstick (98119)  Labs Reviewed: HgBA1c: 6.6 (09/27/2007)   Creat: 0.82 (02/17/2007)   Microalbumin: 0.94 (10/07/2006)   Problem # 3:  DYSLIPIDEMIA (ICD-272.4) No at goal, but on simvastatin 80mg .  Will consider changing to cresor 40mg  during next visit if cost will not be an issue.  Will address on follow up. Her updated medication list for this problem includes:    Simvastatin 80 Mg Tabs (Simvastatin) .Marland Kitchen... Take 1 tablet by mouth once a day  Labs Reviewed: Chol: 185 (10/07/2006)   HDL: 62 (10/07/2006)   LDL: 96  (10/07/2006)   TG: 137 (10/07/2006) SGOT: 13 (10/07/2006)   SGPT: 8 (10/07/2006)   Problem # 4:  HYPERTENSION (ICD-401.9) BP not at goal, will address during follow up.  If elevated, increase coreg to 25mg  two times a day.  Could also consider increasing lasix to two times a day dosing (per JNC 7 guidelines). Her updated medication list for this problem includes:  Lasix 40 Mg Tabs (Furosemide) .Marland Kitchen... Take 1 tablet by mouth once a day    Lotensin 10 Mg Tabs (Benazepril hcl) .Marland Kitchen... Take 1 tablet by mouth once a day    Coreg 12.5 Mg Tabs (Carvedilol) .Marland Kitchen... Take 1 tablet by mouth two times a day  BP today: 131/80 Prior BP: 164/91 (09/27/2007)  Labs Reviewed: Creat: 0.82 (02/17/2007) Chol: 185 (10/07/2006)   HDL: 62 (10/07/2006)   LDL: 96 (10/07/2006)   TG: 137 (10/07/2006)   Complete Medication List: 1)  Synthroid 100 Mcg Tabs (Levothyroxine sodium) .... Take 1 tablet by mouth once a day 2)  Aspirin Ec Extra Strength 500 Mg Tbec (Aspirin) .... Take 1 tablet by mouth once a day 3)  Lasix 40 Mg Tabs (Furosemide) .... Take 1 tablet by mouth once a day 4)  Lotensin 10 Mg Tabs (Benazepril hcl) .... Take 1 tablet by mouth once a day 5)  Coreg 12.5 Mg Tabs (Carvedilol) .... Take 1 tablet by mouth two times a day 6)  Simvastatin 80 Mg Tabs (Simvastatin) .... Take 1 tablet by mouth once a day 7)  Glucophage 850 Mg Tabs (Metformin hcl) .... Take 1 tablet by mouth two times a day 8)  Nexium 40 Mg Cpdr (Esomeprazole magnesium) .... Take 1 tablet by mouth once a day 9)  Nova Max Glucose Test Strp (Glucose blood) .... Tests twice daily 10)  Lancets 30g Misc (Lancets) .... Test twice daily 11)  Ambien 10 Mg Tabs (Zolpidem tartrate) .... Take 1 tablet by mouth once a day 12)  Vicodin Es 7.5-750 Mg Tabs (Hydrocodone-acetaminophen) .... Take 1 tablet every 6 hour for pain as needed. 13)  Paxil 20 Mg Tabs (Paroxetine hcl) .... Take 1 tablet by mouth once a day 14)  Docusate Sodium 100 Mg Caps (Docusate  sodium) .Marland Kitchen.. 1 tablet twice a day. 15)  [facility] Senna-extra 17.2 Mg Tabs (Sennosides) .... Take 1 tablet twice a day. 16)  Pred Forte 1 % Susp (Prednisolone acetate) .... Apply 1 drop 4 times daily 17)  Dorzolamide-timolol 2-0.5 % Soln (Dorzolamide-timolol) .Marland Kitchen.. 1 drop two times a day in right eye.   Patient Instructions: 1)  Please schedule a follow-up appointment in 1 month, Please schedule with Dr. Onalee Hua, anytime around 1 month. 2)  Continue all your current medications.  3)  If you have any new symptoms:  nausea, vomiting, stomach pain, fevers, etc....call clinic to be seen sooner.  4)  Can use diabetic tussin for cough, can ask your pharmacist which cough syrup is good for diabetics...Marland KitchenMarland Kitchenthey will know.    Prescriptions: VICODIN ES 7.5-750 MG TABS (HYDROCODONE-ACETAMINOPHEN) take 1 tablet every 6 hour for pain as needed.  #90 x 0   Entered and Authorized by:   Mariea Stable MD   Signed by:   Mariea Stable MD on 11/02/2007   Method used:   Print then Give to Patient   RxID:   [id number]  ]

## 2010-03-04 NOTE — Letter (Signed)
Summary: Handout Printed  Printed Handout:  - *Patient Instructions 

## 2010-03-04 NOTE — Progress Notes (Signed)
Summary: Refill/gh  Phone Note Refill Request Message from:  Pharmacy  Refills Requested: Medication #1:  COREG 12.5 MG  TABS Take 1 tablet by mouth two times a day   Last Refilled: 09/02/2007  Medication #2:  SYNTHROID 100 MCG TABS Take 1 tablet by mouth once a day   Last Refilled: 09/02/2007  Method Requested: Fax to Local Pharmacy Initial call taken by: Angelina Ok RN,  October 04, 2007 11:09 AM  Follow-up for Phone Call        RX sent electronically Follow-up by: Mariea Stable MD,  October 04, 2007 11:13 AM      Prescriptions: COREG 12.5 MG  TABS (CARVEDILOL) Take 1 tablet by mouth two times a day  #62 x 5   Entered and Authorized by:   Mariea Stable MD   Signed by:   Mariea Stable MD on 10/04/2007   Method used:   Electronically to        [facility] (retail)       [address]       [place], [place]  [address]       Ph: [phone]       Fax: [phone]   RxID:   [id number] SYNTHROID 100 MCG TABS (LEVOTHYROXINE SODIUM) Take 1 tablet by mouth once a day  #31 x 5   Entered and Authorized by:   Mariea Stable MD   Signed by:   Mariea Stable MD on 10/04/2007   Method used:   Electronically to        [facility] (retail)       [address]       [place], [place]  [id number]       Ph: [phone]       Fax: [phone]   RxID:   [id number]

## 2010-03-04 NOTE — Progress Notes (Signed)
Summary: Refill/gh  Phone Note Refill Request Message from:  Fax from Pharmacy on April 05, 2009 1:57 PM  Refills Requested: Medication #1:  AMBIEN 10 MG  TABS Take 1 tablet by mouth once a day   Last Refilled: 05/02/2009  Method Requested: Electronic Initial call taken by: Angelina Ok RN,  April 05, 2009 1:57 PM  Follow-up for Phone Call        Refill approved-nurse to complete Follow-up by: Mariea Stable MD,  April 08, 2009 3:20 PM  Additional Follow-up for Phone Call Additional follow up Details #1::        Rx called to pharmacy Additional Follow-up by: Angelina Ok RN,  April 10, 2009 11:25 AM    Prescriptions: AMBIEN 10 MG  TABS (ZOLPIDEM TARTRATE) Take 1 tablet by mouth once a day  #31 x 3   Entered and Authorized by:   Mariea Stable MD   Signed by:   Mariea Stable MD on 04/08/2009   Method used:   Telephoned to ...       [facility] [id number]* (retail)       [address] [place], [place]  [address]       Ph: [phone]       Fax: [phone]   RxID:   [id number]

## 2010-03-04 NOTE — Progress Notes (Signed)
Summary: med refill darvocet/wl  Phone Note Refill Request Message from:  Fax from Pharmacy on August 31, 2006 4:09 PM  Refills Requested: Medication #1:  DARVOCET-N 100  TABS One tab every four hours as needed   Dosage confirmed as above?Dosage Confirmed   Last Refilled: 08/05/2006  Method Requested: Fax to Local Pharmacy Initial call taken by: Dorene Sorrow RN,  August 31, 2006 4:10 PM  Follow-up for Phone Call        There is a note in February 2008 that states that patient was switched to Diclofenac and Tramadol instead of Darvocet. However, she had Darvocet filled on 08/05/06. Was that a new prescription or an old one that she had additional refills on? Follow-up by: Eliseo Gum MD,  August 31, 2006 4:42 PM  Additional Follow-up for Phone Call Additional follow up Details #1::        Fax from pharmacy states Rx was written on 04/21/06 by Dr. Darrick Huntsman with 3 additional refills.  Refill history: 05/06/06, 06/04/06, 07/12/06 and 08/05/06...................................................................Marland KitchenDorene Sorrow RN  August 31, 2006 5:29 PM     Additional Follow-up for Phone Call Additional follow up Details #2::    Refill approved Follow-up by: Manning Charity MD,  September 01, 2006 10:17 AM  Additional Follow-up for Phone Call Additional follow up Details #3:: Details for Additional Follow-up Action Taken: Refill faxed to pharmacy. Additional Follow-up by: Dorene Sorrow RN,  September 01, 2006 5:40 PM   Prescriptions: DARVOCET-N 100  TABS (PROPOXYPHENE N-APAP TABS) One tab every four hours as needed  #180 x 2   Entered and Authorized by:   Manning Charity MD   Signed by:   Manning Charity MD on 09/01/2006   Method used:   Telephoned to ...         RxID:   [id number]

## 2010-03-04 NOTE — Miscellaneous (Signed)
Summary: LIPIDS  Clinical Lists Changes  Observations: Added new observation of LDL: 90 mg/dL (40/98/1191 4:78) Added new observation of HDL: 56 mg/dL (29/56/2130 8:65) Added new observation of TRIGLYC TOT: 81 mg/dL (78/46/9629 5:28) Added new observation of CHOLESTEROL: 162 mg/dL (41/32/4401 0:27)
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
## 2010-03-04 NOTE — Progress Notes (Signed)
Summary: refill/gg  Phone Note Refill Request  on July 24, 2008 10:28 AM  Refills Requested: Medication #1:  PAXIL 20 MG  TABS Take 1 tablet by mouth once a day  Medication #2:  [facility] SENNA-EXTRA 17.2 MG TABS take 1 tablet twice a day..  Medication #3:  DOCUSATE SODIUM 100 MG CAPS 1 tablet twice a day.  Medication #4:  VICODIN ES 7.5-750 MG TABS take 1 tablet every 6 hour for pain as needed. All meds changed to [facility] pharmacy   Method Requested: Electronic Initial call taken by: Merrie Roof RN,  July 24, 2008 10:29 AM  Follow-up for Phone Call        Refill approved-nurse to complete.  Vicodin needs to be called in Follow-up by: Mariea Stable MD,  July 25, 2008 8:10 AM  Additional Follow-up for Phone Call Additional follow up Details #1::        vicodin called in Additional Follow-up by: Merrie Roof RN,  July 25, 2008 12:10 PM      Prescriptions: VICODIN ES 7.5-750 MG TABS (HYDROCODONE-ACETAMINOPHEN) take 1 tablet every 6 hour for pain as needed.  #90 x 2   Entered and Authorized by:   Mariea Stable MD   Signed by:   Mariea Stable MD on 07/25/2008   Method used:   Telephoned to ...       [facility] [id number] (retail)       [address]       [place], [place]  [address]       Ph: [phone]       Fax: [phone]   RxID:   [id number] PAXIL 20 MG  TABS (PAROXETINE HCL) Take 1 tablet by mouth once a day  #90 x 2   Entered and Authorized by:   Mariea Stable MD   Signed by:   Mariea Stable MD on 07/25/2008   Method used:   Electronically to        [facility] [address] (retail)       [address] [place], [place]  [address]       Ph: [phone]       Fax: [phone]   RxID:   [id number] DOCUSATE SODIUM 100 MG CAPS (DOCUSATE SODIUM) 1 tablet twice a day.  #180 x 2   Entered and Authorized by:   Mariea Stable MD   Signed by:   Mariea Stable MD on 07/25/2008   Method  used:   Electronically to        [facility] [address] (retail)       [address] [place], [place]  [address]       Ph: [phone]       Fax: [phone]   RxID:   [id number] [facility] SENNA-EXTRA 17.2 MG TABS (SENNOSIDES) take 1 tablet twice a day.  #180 x 2   Entered and Authorized by:   Mariea Stable MD   Signed by:   Mariea Stable MD on 07/25/2008   Method used:   Electronically to        [facility] [address] (retail)       [address]       [place]  [place], [place]  [address]       Ph: [phone]       Fax: [phone]   RxID:   [id number]

## 2010-03-04 NOTE — Letter (Signed)
Summary: Dr. Nadyne Coombes   Dr. Nadyne Coombes   Imported By: Florinda Marker 10/07/2007 16:41:06  _____________________________________________________________________  External Attachment:    Type:   Image     Comment:   External Document

## 2010-03-04 NOTE — Progress Notes (Signed)
Summary: med refill/gp  Phone Note Refill Request Message from:  Fax from Pharmacy on November 03, 2007 12:36 PM  Refills Requested: Medication #1:  GLUCOPHAGE 850 MG TABS Take 1 tablet by mouth two times a day   Last Refilled: 09/02/2007  Method Requested: Electronic Initial call taken by: Chinita Pester RN,  November 03, 2007 12:36 PM  Follow-up for Phone Call        sent electronically to [facility] Follow-up by: Mariea Stable MD,  November 03, 2007 1:38 PM      Prescriptions: GLUCOPHAGE 850 MG TABS (METFORMIN HCL) Take 1 tablet by mouth two times a day  #62 x prn   Entered and Authorized by:   Mariea Stable MD   Signed by:   Mariea Stable MD on 11/03/2007   Method used:   Electronically to        [facility] (retail)       [address]       [place], [place]  [id number]       Ph: [phone]       Fax: [phone]   RxID:   [id number]

## 2010-03-04 NOTE — Miscellaneous (Signed)
Summary: [facility]  [facility]: [facility]   Imported By: Florinda Marker 05/03/2008 12:06:51  _____________________________________________________________________  External Attachment:    Type:   Image     Comment:   External Document

## 2010-03-04 NOTE — Progress Notes (Signed)
Summary: Refill/gh  Phone Note Refill Request Message from:  Fax from Pharmacy on November 22, 2009 4:42 PM  Refills Requested: Medication #1:  TRAMADOL HCL 50 MG TABS Take 1 tablet every 8 hour for pain as needed. Pharmacy would like to get 6 month refills if possible.   Method Requested: Electronic Initial call taken by: Angelina Ok RN,  November 22, 2009 4:42 PM  Follow-up for Phone Call        It was just filled about 2 weeks ago.  I am ok with them providing 5 refills on the last script. Follow-up by: Mariea Stable MD,  November 25, 2009 9:53 AM  Additional Follow-up for Phone Call Additional follow up Details #1::        Rx faxed to pharmacy Additional Follow-up by: Angelina Ok RN,  November 26, 2009 4:32 PM

## 2010-03-04 NOTE — Progress Notes (Signed)
Summary: refill/gg  Phone Note Refill Request  on September 14, 2007 5:42 PM  Refills Requested: Medication #1:  NEXIUM 40 MG  CPDR Take 1 tablet by mouth once a day   Last Refilled: 09/02/2007  Method Requested: Electronic Initial call taken by: Merrie Roof RN,  September 14, 2007 5:42 PM  Follow-up for Phone Call       Follow-up by: Mariea Stable MD,  September 14, 2007 11:59 PM      Prescriptions: NEXIUM 40 MG  CPDR (ESOMEPRAZOLE MAGNESIUM) Take 1 tablet by mouth once a day  #31 x prn   Entered and Authorized by:   Mariea Stable MD   Signed by:   Mariea Stable MD on 09/14/2007   Method used:   Electronically sent to ...       [facility]       [address]       [place], [place]  [id number]       Ph: [phone]       Fax: [phone]   RxID:   [id number]

## 2010-03-04 NOTE — Initial Assessments (Signed)
INTERNAL MEDICINE ADMISSION HISTORY AND PHYSICAL  PCP: Dr. Onalee Hua  Attending: Dr. Maurice March Contacts: Dr. Baltazar Apo [phone]      Dr. Gwenlyn Perking [phone]  CC: Elevated blood pressure and headache  HPI: Ms. Tippy is a 75 y/o woman with PMH significant for DM II, Hypothyroidism, HLD, CHF, OA who presents to the ED for elevated blood pressure and headache that first began 2-3 days PTA. She reports her headache has progressively gotten worse. Located on the right parietal / occipital region, throbbing in nature, about a 6/10. She states when she checked her blood pressure at home, her readings ranged in the low 200s over 90. Patient denies any blurry vision, chest pain, SOB, lower extremity swelling, cough, or abdominal pain. Patient does report she was mildly nauseated with two episodes of vomiting (no blood). No changes in urinary or bowel habits.   ALLERGIES:  ! CODEINE   PAST MEDICAL HISTORY: Recent admission 09/2008 for syncope, with full workup that included negative imaging and carotid doppler, normal EEG, no findings on telemetry Immature traumatic cataract, right eye 2009 Osteoarthritis h/o VDRF s/p Tracheostomy 1999 for prolonged mech vent Cardiac cath 09/2008 -  normal coronary arteries and   normal left ventricular function.  I believe her chest pain is   noncardiac EF 55% by ECHO 07/10  mild focal   basal and mild concentric hypertrophy of the septum Hypertension GERD Dyslipidemia Hypothyroidism Diabetes Mellitus-9/00 Major depressive episode - Pt supposed to be on Paxil, but currently not on any meds Diverticulosis, internal hemorrhoids by colonoscopy 2004 History of Tuberculosis that was treated   MEDICATIONS:  SYNTHROID 100 MCG TABS (LEVOTHYROXINE SODIUM) Take 1 tablet by mouth once a day LOTENSIN 10 MG TABS (BENAZEPRIL HCL) Take 1 tablet by mouth once a day COREG 12.5 MG  TABS (CARVEDILOL) Take 1 tablet by mouth two times a day SIMVASTATIN 80 MG  TABS (SIMVASTATIN) Take 1  tablet by mouth once a day GLUCOPHAGE 1000 MG TABS (METFORMIN HCL) Take 1 tablet by mouth two times a day NOVA MAX GLUCOSE TEST   STRP (GLUCOSE BLOOD) tests twice daily LANCETS 30G   MISC (LANCETS) test twice daily AMBIEN 10 MG  TABS (ZOLPIDEM TARTRATE) Take 1 tablet by mouth once a day VICODIN ES 7.5-750 MG TABS (HYDROCODONE-ACETAMINOPHEN) take 1 tablet every 6 hour for pain as needed. PAXIL 20 MG  TABS (PAROXETINE HCL) Take 1 tablet by mouth once a day DOCUSATE SODIUM 100 MG CAPS (DOCUSATE SODIUM) 1 tablet twice a day. [facility] SENNA-EXTRA 17.2 MG TABS (SENNOSIDES) take 1 tablet twice a day. PLAVIX 75 MG TABS (CLOPIDOGREL BISULFATE) take 1 pill by mouth daily. ZANTAC 150 MG TABS (RANITIDINE HCL) take 1 pill by mouth daily.   SOCIAL HISTORY: Lives with daughter Non-smoker No illicit drug use or alcohol use Medicare      FAMILY HISTORY Mother and Father both deceased from [place], 3 sisters also deceased from [place]   ROS: General: Denies fever, chills, weight loss/gain, fatigue, recent travel, sick contacts, diaphoresis, night sweats CV: Denies chest pain, palpitations, dyspnea, orthopnea, PND, edema, syncope Resp: Denies dyspnea, cough GI: Denies vomiting, diarrhea, constipation, hematemesis, hematochezia, melena  GU: Denies dysuria, urinary frequency MS: Denies joint pain, joint swelling Neuro/Eyes: Denies diplopia, blurry vision, vision change, dizziness   VITALS:  T: 97.9 P:63  BP:180/92  R:16  O2SAT: 92% ON:RA  PHYSICAL EXAM: General:  alert, well-developed, and cooperative to examination.   Head:  normocephalic and atraumatic.   Eyes:  vision grossly intact, pupils equal,  pupils round, pupils reactive to light, no injection and anicteric.   Mouth:  pharynx pink and moist   Neck:  supple, full ROM, no thyromegaly, no JVD, and no carotid bruits.   Lungs:  normal respiratory effort, scattered rales heard bilaterally on lower lung bases, no crackles Heart:  normal rate, regular  rhythm, no murmur, no gallop, and no rub.   Abdomen:  soft, non-tender, normal bowel sounds hyperactive, no distention, no guarding Extremities:  2+ DP/PT pulses bilaterally, mild bilateral lower extremity swelling, non-pitting  Neurologic:  alert & oriented X3, cranial nerves II-XII intact  LABS:   BMET   Sodium (NA)                              143               135-145          mEq/L  Potassium (K)                            3.4        l      3.5-5.1          mEq/L  Chloride                                 106               96-112           mEq/L  CO2                                      30                19-32            mEq/L  Glucose                                  150        h      70-99            mg/dL  BUN                                      4          l      6-23             mg/dL  Creatinine                               0.83              0.4-1.2          mg/dL  GFR, Est Non African American            >60               >60              mL/min  GFR, Est African American                >  60               >60              mL/min    Oversized comment, see footnote  1  Calcium                                  10.3              8.4-10.5         mg/dL  CBC  WBC                                      6.2               4.0-10.5         K/uL  RBC                                      4.22              3.87-5.11        MIL/uL  Hemoglobin (HGB)                         12.7              12.0-15.0        g/dL  Hematocrit (HCT)                         38.0              36.0-46.0        %  MCV                                      90.2              78.0-100.0       fL  MCHC                                     33.3              30.0-36.0        g/dL  RDW                                      15.0              11.5-15.5        %  Platelet Count (PLT)                     205               150-400          K/uL  UA  Color, Urine                             YELLOW  YELLOW  Appearance                                CLEAR             CLEAR  Specific Gravity                         1.016             1.005-1.030  pH                                       6.5               5.0-8.0  Urine Glucose                            NEGATIVE          NEG              mg/dL  Bilirubin                                NEGATIVE          NEG  Ketones                                  NEGATIVE          NEG              mg/dL  Blood                                    NEGATIVE          NEG  Protein                                  NEGATIVE          NEG              mg/dL  Urobilinogen                             1.0               0.0-1.0          mg/dL  Nitrite                                  NEGATIVE          NEG  Leukocytes                               NEGATIVE          NEG   Protime ( Prothrombin Time)              12.5              11.6-15.2        seconds  INR                                      0.9               0.0-1.5  CT Head Findings: Old right anterior limb internal capsule lacunar infarct.   Old left periventricular white matter and caudate head lacunar   infarcts.  Normal size and position of the ventricles.  No   intracranial hemorrhage, mass lesions or CT evidence of acute   infarction.  Unremarkable bones and included paranasal sinuses.    IMPRESSION:   Old lacunar infarcts, as described above.  No acute abnormality.    ASSESSMENT AND PLAN:  1) Uncontrolled HTN - Systolic blood pressure range between 180-190 and in 200s according to patient (based on home blood pressure machine reading). Headache, nausea, and one episode of vomiting associated with elevated blood pressure. No other neurologic findings. Patient's blood pressure medications were adjusted one month ago secondary to admission for syncope. Will admit patient for observation and control blood pressure. Will resume home blood pressure meds and will add Lasix 20 mg. CT Head performed in ED reveals old lacunar infarcts  but no acute changes. Will adjust patient's BP meds, pain management and anti-emetics. Due to nausea and abdominal discomfort, given patient's risk factors and family history, we will check cardiac enzymes x2 and EKG.   2) DM II - last AIC 7.7 (08/2008). Will hold Metformin while in hospital and will place pt on SSI.  3) GERD - will continue Ranitidine  4) Hypothyroidism - Will continue Synthroid 100 micrograms.   5) HLD - Last FLP 07/10; LDL of 90, HDL 56. Will continue Simvastatin.    6) VTE PROPH: lovenox  7) Hx of Lacunar Infarcts while using Aspirin - Plavix was initiated during last admission 08/2008. Will continue current regimen.
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
## 2010-03-04 NOTE — Progress Notes (Signed)
Summary: Appointment  Phone Note Outgoing Call   Call placed by: Angelina Ok RN,  December 30, 2009 11:27 AM Call placed to: Specialist Summary of Call: Call to Dr. Jenne Pane office.  Pt no showed on 09/04/2009 and 10/21/2009.  Has not been seen in the office as of yet. Did not reschedule the appointments. 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  Pt needs follow up for this.  Can we please arrange, even if it requires [place] seeing her again to further discuss?  Thanks Follow-up by: Mariea Stable MD,  December 30, 2009 2:53 PM  Additional Follow-up for Phone Call Additional follow up Details #1::        Call to pt did go to appointments was not seen due to inability to pay the co-pay.  Pt said that she wants to find out what is wrong.  Pt was asked if she would be willing to go to [facility] in [place].  Pt agreed appointment scheduled for 01/14/2010 with Dr. Lendell Caprice at 3:00 PM with a 2:45 PM arrival time.  Pt will need to take her insurance card with her to speak with the Financial Counselor there. Additional Follow-up by: Angelina Ok RN,  December 30, 2009 3;10 PM    Additional Follow-up for Phone Call Additional follow up Details #2::    Thanks for your help with the situation.   Follow-up by: Mariea Stable MD,  January 02, 2010 11:42 PM

## 2010-03-04 NOTE — Progress Notes (Signed)
Summary: refill/ hla  Phone Note Refill Request Message from:  Fax from Pharmacy on February 06, 2008 10:36 AM  Refills Requested: Medication #1:  PAXIL 20 MG  TABS Take 1 tablet by mouth once a day   Last Refilled: 12/1 Initial call taken by: Marin Roberts RN,  February 06, 2008 10:36 AM  Follow-up for Phone Call        sent electronically Follow-up by: Mariea Stable MD,  February 06, 2008 1:46 PM      Prescriptions: PAXIL 20 MG  TABS (PAROXETINE HCL) Take 1 tablet by mouth once a day  #31 x 6   Entered and Authorized by:   Mariea Stable MD   Signed by:   Mariea Stable MD on 02/06/2008   Method used:   Electronically to        [facility] (retail)       [address]       [place], [place]  [address]       Ph: [phone]       Fax: [phone]   RxID:   [id number]
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
## 2010-03-04 NOTE — Assessment & Plan Note (Signed)
Summary: CHECKUP/ SB.   Vital Signs:  Patient Profile:   75 Years Old Female Weight:      212.8 pounds (96.73 kg) Temp:     98.4 degrees F (36.89 degrees C) oral Pulse rate:   77 / minute BP sitting:   116 / 73  (right arm)  Pt. in pain?   no  Vitals Entered By: Krystal Eaton Duncan Dull) (April 22, 2006 1:36 PM)              Nutritional Status Normal CBG Result 79  Have you ever been in a relationship where you felt threatened, hurt or afraid?No   Does patient need assistance? Functional Status Self care Ambulation Normal   Chief Complaint:  left ear problems and requests another sample of foot cream.  History of Present Illness: Left ear not painful but has been "sounds like something's in there lke a cricket" for over 1 month R. ear fine.  Used some steroid cream which had been given to her several months ago for patches of lichen simplex chronicus on feet, and it helped, but as soon as it stopped, it came back.  Areas of hyperpigmentation over knuckes for last 1 or 2 years, no pruritis.   Having some stress incontinence due to Lasix;  occasional duysuria, no bleeding, fevers or chills, no diarrhea  Prior Medications: SYNTHROID 100 MCG TABS (LEVOTHYROXINE SODIUM) Take 1 tablet by mouth once a day ASPIRIN EC EXTRA STRENGTH 500 MG TBEC (ASPIRIN) Take 1 tablet by mouth once a day LASIX 40 MG TABS (FUROSEMIDE) Take 1 tablet by mouth once a day LOTENSIN 10 MG TABS (BENAZEPRIL HCL) Take 1 tablet by mouth once a day COREG 6.25 MG TABS (CARVEDILOL) Take 1 tablet by mouth two times a day ZOCOR 40 MG TABS (SIMVASTATIN) Take 1 tablet by mouth once a day PREVACID 30 MG CPDR (LANSOPRAZOLE) Take 1 tablet by mouth once a day DARVOCET-N 100  TABS (PROPOXYPHENE N-APAP TABS) One tab every four hours as needed GLUCOPHAGE 850 MG TABS (METFORMIN HCL) Take 1 tablet by mouth two times a day AMBIEN 5 MG TABS (ZOLPIDEM TARTRATE) one tablet at bedtime as needed for insomnia Current Allergies: !  CODEINE    Risk Factors:  Tobacco use:  quit    Year quit:  1976  Colonoscopy History:    Date of Last Colonoscopy:  01/11/2003  Mammogram History:    Date of Last Mammogram:  01/03/2003  PAP Smear History:    Date of Last PAP Smear:  08/12/1999      Impression & Recommendations:  Problem # 1:  INCONTINENCE, MIXED, URGE/STRESS (ICD-788.33) Likely secondary to Lasix.  Had 4 C sections but discussed Kegel exercises and will rule out infection as source.  Orders: T-Urinalysis Dipstick only ([id number])  UA is completely negative.    Problem # 2:  HYPOTHYROIDISM NOS (ICD-244.9)  Her updated medication list for this problem includes:    Synthroid 100 Mcg Tabs (Levothyroxine sodium) .Marland Kitchen... Take 1 tablet by mouth once a day  Orders: T-TSH ([id number])   Problem # 3:  CHF (ICD-428.0) Currently asymptomatic.   Her updated medication list for this problem includes:    Aspirin Ec Extra Strength 500 Mg Tbec (Aspirin) .Marland Kitchen... Take 1 tablet by mouth once a day    Lasix 40 Mg Tabs (Furosemide) .Marland Kitchen... Take 1 tablet by mouth once a day    Lotensin 10 Mg Tabs (Benazepril hcl) .Marland Kitchen... Take 1 tablet by mouth once a day    Coreg  6.25 Mg Tabs (Carvedilol) .Marland Kitchen... Take 1 tablet by mouth two times a day Will check BMET today to eval potassium level and renal fxn.    Problem # 4:  DYSLIPIDEMIA (ICD-272.4) Is not fasting today, last lipid panel 3/07 showed excellent control.   Her updated medication list for this problem includes:    Zocor 40 Mg Tabs (Simvastatin) .Marland Kitchen... Take 1 tablet by mouth once a day Will recheck prior to next visit.  check LFT's today.  Problem # 5:  DIABETES MELLITUS (ICD-250.00)  Her updated medication list for this problem includes:    Aspirin Ec Extra Strength 500 Mg Tbec (Aspirin) .Marland Kitchen... Take 1 tablet by mouth once a day    Lotensin 10 Mg Tabs (Benazepril hcl) .Marland Kitchen... Take 1 tablet by mouth once a day    Glucophage 850 Mg Tabs (Metformin hcl) .Marland Kitchen... Take 1 tablet by  mouth two times a day  Orders: T-Hgb A1C (in-house) ([id number]) T- Capillary Blood Glucose (45409)  No changes to meds today as HgbA1c is 6.6 on current metformin dose.   Problem # 6:  OTITIS EXTERNA, ACUTE, LEFT (ICD-380.22) No evidence of infection or irritation on exam.  Likely secondary to hair products. . Was treated 1 yr ago for fungal otitis with no improvement and feels the triamcinolone cream worked better.  Cautioned against repeated used of steroid creams in ear; recommended occluding canal with cotton ball during hair washing and if problem persists will consider Elidel cream.  Problem # 7:  LICHEN SIMPLEX CHRONICUS (ICD-698.3) Was treated previously with good results using mild topical steroid cream.   Triamcinolone 0.15  cream two times a day for 1-2 weeks, then continue moisturizing and use only as needed.  Other Orders: T-Basic Metabolic Panel [id number]) T-Hepatic Function [id number])   Laboratory Results   Urine Tests  Date/Time Recieved: 04-22-06/2:36  Routine Urinalysis   Color: lt. yellow Appearance: Clear Glucose: negative   (Normal Range: Negative) Bilirubin: negative   (Normal Range: Negative) Ketone: negative   (Normal Range: Negative) Spec. Gravity: 1.020   (Normal Range: 1.003-1.035) Blood: negative   (Normal Range: Negative) pH: 5.0   (Normal Range: 5.0-8.0) Protein: negative   (Normal Range: Negative) Urobilinogen: 0.2   (Normal Range: 0-1) Nitrite: negative   (Normal Range: Negative) Leukocyte Esterace: negative   (Normal Range: Negative)     Blood Tests   Date/Time Recieved: April 22, 2006 1:58 PM  Date/Time Reported: April 22, 2006 1:58 PM ..................................................................Marland KitchenOren Beckmann  April 22, 2006 1:58 PM   HGBA1C: 6.6%   (Normal Range: Non-Diabetic - 3-6%   Control Diabetic - 6-8%) CBG Random: 79

## 2010-03-04 NOTE — Progress Notes (Signed)
Summary: refill/ hla  Phone Note Refill Request Message from:  Fax from Pharmacy on February 06, 2008 11:01 AM  Refills Requested: Medication #1:  VICODIN ES 7.5-750 MG TABS take 1 tablet every 6 hour for pain as needed.   Last Refilled: 12/1 Initial call taken by: Marin Roberts RN,  February 06, 2008 11:01 AM  Follow-up for Phone Call        Refill approved-nurse to complete, pt also needs to be scheduled to see if there is any objective findings to continue prescribing vicodin.  This should be a temporary issue from what I remember. Follow-up by: Mariea Stable MD,  February 06, 2008 1:48 PM  Additional Follow-up for Phone Call Additional follow up Details #1::        Rx called to pharmacy also had note added to script for pt to make an appt and flag to [facility] for appt Additional Follow-up by: Marin Roberts RN,  February 10, 2008 10:54 AM      Prescriptions: VICODIN ES 7.5-750 MG TABS (HYDROCODONE-ACETAMINOPHEN) take 1 tablet every 6 hour for pain as needed.  #90 x 0   Entered and Authorized by:   Mariea Stable MD   Signed by:   Mariea Stable MD on 02/06/2008   Method used:   Telephoned to ...       [facility]* (retail)       [address]       [place], [place]  [address]       Ph: [phone]       Fax: [phone]   RxID:   [id number]

## 2010-03-04 NOTE — Progress Notes (Signed)
Summary: med refil/wl  Phone Note Refill Request   Refills Requested: Medication #1:  Tramadol 50mg  #80 Take one tablet every 4 hours as needed for pain   Last Refilled: 09/04/2005 Left message for patient ([phone] phone number on fax) to call [facility].  Also called pharmacy to instruct them to have pt call [facility] so we can find out why patient needs pain medication.    Method Requested: Fax to Local Pharmacy Initial call taken by: Dorene Sorrow RN,  March 18, 2006 11:41 AM  Follow-up for Phone Call        Follow up note added to previous phone message dated 03/05/06.  Please see this note for clarification.     Follow-up by: Dorene Sorrow RN,  March 18, 2006 2:37 PM  Additional Follow-up for Phone Call Additional follow up Details #1::        Rx faxed to pharmacy.  Tramadol denied, Diclofenac refill faxed to pharmacy.   Additional Follow-up by: Dorene Sorrow RN,  March 18, 2006 4:20 PM

## 2010-03-04 NOTE — Miscellaneous (Signed)
  Clinical Lists Changes  Problems: Added new problem of INTERTRIGO (ZOX-096.04) Medications: Added new medication of CLOTRIMAZOLE 1 % CREA (CLOTRIMAZOLE) apply two times a day to affected areas - Signed Rx of CLOTRIMAZOLE 1 % CREA (CLOTRIMAZOLE) apply two times a day to affected areas;  #45gm x 3;  Signed;  Entered by: Mariea Stable MD;  Authorized by: Mariea Stable MD;  Method used: Electronically to [facility] [id number], [place], [place], [place]  [address], Ph: [phone], Fax: [phone]    Prescriptions: CLOTRIMAZOLE 1 % CREA (CLOTRIMAZOLE) apply two times a day to affected areas  #45gm x 3   Entered and Authorized by:   Mariea Stable MD   Signed by:   Mariea Stable MD on 08/21/2009   Method used:   Electronically to        [facility] [id number]* (retail)       [address] [place], [place]  [address]       Ph: [phone]       Fax: [phone]   RxID:   [id number]    Problem # 13:  INTERTRIGO (ICD-695.89) clotrimazole trial  Complete Medication List: 1)  Synthroid 100 Mcg Tabs (Levothyroxine sodium) .... Take 1 tablet by mouth once a day 2)  Amlodipine Besylate 10 Mg Tabs (Amlodipine besylate) .... Take 1 tablet by mouth once a day 3)  Coreg 12.5 Mg Tabs (Carvedilol) .... Take 1 tablet by mouth two times a day 4)  Simvastatin 80 Mg Tabs (Simvastatin) .... Take 1 tablet by mouth once a day 5)  Nova Max Glucose Test Strp (Glucose blood) .... Tests twice daily 6)  Lancets 30g Misc (Lancets) .... Test twice daily 7)  Ambien 10 Mg Tabs (Zolpidem tartrate) .... Take 1 tablet by mouth once a day 8)  Paxil 20 Mg Tabs (Paroxetine hcl) .... Take 1 tablet by mouth once a day 9)  Docusate Sodium 100 Mg Caps (Docusate sodium) .Marland Kitchen.. 1 tablet twice a day. 10)  [facility] Senna-extra 17.2 Mg Tabs (Sennosides) .... Take 1 tablet twice a day. 11)  Plavix 75 Mg Tabs (Clopidogrel bisulfate) .... Take 1 pill by mouth  daily. 12)  Zantac 150 Mg Tabs (Ranitidine hcl) .... Take 1 pill by mouth twice a day. 13)  Furosemide 20 Mg Tabs (Furosemide) .... Take 1 tablet by mouth once a day 14)  Glipizide 2.5 Mg Xr24h-tab (Glipizide) .... Take 1 tablet by mouth daily. 15)  Tramadol Hcl 50 Mg Tabs (Tramadol hcl) .... Take 1 tablet every 8 hour for pain as needed. 16)  Metformin Hcl 1000 Mg Tabs (Metformin hcl) .... Take 1 tablet by mouth two times a day 17)  Clotrimazole 1 % Crea (Clotrimazole) .... Apply two times a day to affected areas

## 2010-03-04 NOTE — Progress Notes (Signed)
Summary: testing supplies/dmr  Phone Note Other Incoming   Summary of Call: fax from [facility], pt out of testing supplies, requesting verbal. Initial call taken by: Jamison Neighbor,  December 28, 2006 10:58 AM  Follow-up for Phone Call        called [facility] and gave verbal order to mail out patient's testing supplies Follow-up by: Jamison Neighbor,  December 28, 2006 11:00 AM    New/Updated Medications: NOVA MAX GLUCOSE TEST   STRP (GLUCOSE BLOOD) tests twice daily LANCETS 30G   MISC (LANCETS) test twice daily
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
## 2010-03-04 NOTE — Progress Notes (Signed)
  Phone Note From Other Clinic   Caller: [place] Details for Reason: Letter to inform us of scheduled procedure Summary of Call: Ms. Strassner is scheduled to have cataract excision under general anesthesia on 10/11/07.  Dr. Mitzi Davenport mailed [place] a letter requesting we contact him if there are contraindications to this procedure.  I have flagged out triage nurse to fax over her medication and problem list so that the anesthesiologist has it available.  She has DM, CHF with a worsening EF, though with a normal cath per our records, and HTN that is intermittently controlled.  I am okay with her having the procedure as long as they are comfortable given her history.  I am the only physician still in the practice who has seen Ms. Deeds, but Dr. Onalee Hua is her PCP and hopefully she will be able to be seen by him in the near future. Initial call taken by: Valetta Close MD,  October 05, 2007 4:05 PM

## 2010-03-04 NOTE — Progress Notes (Signed)
Summary: refill/gg  Phone Note Refill Request  on July 24, 2008 10:24 AM  Refills Requested: Medication #1:  SYNTHROID 100 MCG TABS Take 1 tablet by mouth once a day  Medication #2:  LASIX 40 MG TABS Take 1 tablet by mouth once a day  Medication #3:  LOTENSIN 10 MG TABS Take 1 tablet by mouth once a day  Medication #4:  COREG 12.5 MG  TABS Take 1 tablet by mouth two times a day Pt is changing pharmacy's.  Needs all new Rx sent into [facility]   Method Requested: Electronic Initial call taken by: Merrie Roof RN,  July 24, 2008 10:24 AM      Prescriptions: COREG 12.5 MG  TABS (CARVEDILOL) Take 1 tablet by mouth two times a day  #180 x 4   Entered and Authorized by:   Mariea Stable MD   Signed by:   Mariea Stable MD on 07/25/2008   Method used:   Electronically to        [facility] [id number] (retail)       [address] [place], [place]  [address]       Ph: [phone]       Fax: [phone]   RxID:   [id number] LOTENSIN 10 MG TABS (BENAZEPRIL HCL) Take 1 tablet by mouth once a day  #90 x 4   Entered and Authorized by:   Mariea Stable MD   Signed by:   Mariea Stable MD on 07/25/2008   Method used:   Electronically to        [facility] [address] (retail)       [address]       [place], [place]  [address]       Ph: [phone]       Fax: [phone]   RxID:   [id number] LASIX 40 MG TABS (FUROSEMIDE) Take 1 tablet by mouth once a day  #90 x 4   Entered and Authorized by:   Mariea Stable MD   Signed by:   Mariea Stable MD on 07/25/2008   Method used:   Electronically to        [facility] [address] (retail)       [address] [place], [place]  [address]       Ph: [phone]       Fax: [phone]   RxID:   [id number] SYNTHROID 100 MCG TABS (LEVOTHYROXINE SODIUM) Take 1 tablet by mouth once a day  #90 x 4   Entered and Authorized by:    Mariea Stable MD   Signed by:   Mariea Stable MD on 07/25/2008   Method used:   Electronically to        [facility] [address] (retail)       [address]       [place], [place]  [address]       Ph: [phone]       Fax: [phone]   RxID:   [id number]

## 2010-03-04 NOTE — Progress Notes (Signed)
Summary: refill/gg  Phone Note Refill Request  on August 12, 2009 3:17 PM  Refills Requested: Medication #1:  AMBIEN 10 MG  TABS Take 1 tablet by mouth once a day   Last Refilled: 07/12/2009  Medication #2:  PLAVIX 75 MG TABS take 1 pill by mouth daily.   Last Refilled: 07/16/2009  Method Requested: Fax to Local Pharmacy Initial call taken by: Merrie Roof RN,  August 12, 2009 3:17 PM  Follow-up for Phone Call        Pt needs appointment to be seen, I flagged chilon with earlier refill request.  Please make sure it is done.    Refill approved-nurse to complete.Marland KitchenMarland KitchenMarland KitchenI just refilled plavix, not sure if to the correct pharmacy.  Thanks Follow-up by: Mariea Stable MD,  August 12, 2009 5:08 PM    Prescriptions: AMBIEN 10 MG  TABS (ZOLPIDEM TARTRATE) Take 1 tablet by mouth once a day  #31 x 0   Entered and Authorized by:   Mariea Stable MD   Signed by:   Mariea Stable MD on 08/12/2009   Method used:   Telephoned to ...       [facility] [id number] (retail)       [address] [place], [place]  [address]       Ph: [phone]       Fax: [phone]   RxID:   [id number]
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
## 2010-03-04 NOTE — Miscellaneous (Signed)
Summary: [facility]:PCS  [facility]:PCS   Imported By: Florinda Marker 12/21/2007 11:48:57  _____________________________________________________________________  External Attachment:    Type:   Image     Comment:   External Document

## 2010-03-04 NOTE — Miscellaneous (Signed)
  Clinical Lists Changes  Orders: Added new Service order of Est. Patient Level IV ([id number]) - Signed 

## 2010-03-04 NOTE — Progress Notes (Signed)
Summary: refill/gg  Phone Note Refill Request  on September 24, 2006 3:29 PM  Refills Requested: Medication #1:  PREVACID 30 MG CPDR Take 1 tablet by mouth once a day  Medication #2:  AMBIEN 5 MG TABS one tablet at bedtime as needed for insomnia. **  pt states  she is taking nexium 20 mg daily**  pharmacy states Nexium 20 mg By Alvin Critchley on 08/31/06  Initial call taken by: Merrie Roof RN,  September 24, 2006 3:30 PM  Follow-up for Phone Call        Rx written Follow-up by: Acey Lav MD,  September 24, 2006 4:08 PM      Prescriptions: AMBIEN 5 MG TABS (ZOLPIDEM TARTRATE) one tablet at bedtime as needed for insomnia  #30 x 4   Entered and Authorized by:   Acey Lav MD   Signed by:   Paulette Blanch Dam MD on 09/24/2006   Method used:   Telephoned to ...       [facility]       [address]       [place], [place]  [address]  [place]       Ph: [phone]       Fax: [phone]   RxID:   [id number] PREVACID 30 MG CPDR (LANSOPRAZOLE) Take 1 tablet by mouth once a day  #30 x 5   Entered and Authorized by:   Acey Lav MD   Signed by:   Paulette Blanch Dam MD on 09/24/2006   Method used:   Telephoned to ...       [facility]       [address]       [place], [place]  [address]  [place]       Ph: [phone]       Fax: [phone]   RxID:   [id number]
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
## 2010-03-04 NOTE — Assessment & Plan Note (Signed)
Summary: RA NEEDS CHECKUP/CH   Vital Signs:  Patient Profile:   75 Years Old Female Height:     68.5 inches Weight:      206.6 pounds BMI:     31.07 Temp:     97.8 degrees F Pulse rate:   63 / minute BP sitting:   154 / 79  (right arm)  Pt. in pain?   yes    Location:   headache    Intensity:   3    Type:       dull  Vitals Entered By: Chinita Pester RN (October 07, 2006 10:57 AM)              Is Patient Diabetic? Yes  Nutritional Status BMI of > 30 = obese CBG Result 111  Have you ever been in a relationship where you felt threatened, hurt or afraid?No   Does patient need assistance? Functional Status Self care Ambulation Normal     PCP:  Mariea Stable MD  Chief Complaint:  new doctor;checkup.  History of Present Illness: Caitlyn Aguirre is a 75 y/o woman patient of Dr. Onalee Hua. She is here for a regular checkup. She also mentiones some "moles" on her neck that she would like removed.  Current Allergies: ! CODEINE    Risk Factors: Tobacco use:  quit    Year quit:  1976  Colonoscopy History:    Date of Last Colonoscopy:  01/11/2003  Mammogram History:    Date of Last Mammogram:  01/03/2003  PAP Smear History:    Date of Last PAP Smear:  08/12/1999    Physical Exam  General:     Well-developed,well-nourished,in no acute distress; alert,appropriate and cooperative throughout examination Neck:     No carotid bruits.Numerous neuro-fibromatosis appearing growths over her neck. Lungs:     Normal respiratory effort, chest expands symmetrically. Lungs are clear to auscultation, no crackles or wheezes. Heart:     Normal rate and regular rhythm. S1 and S2 normal without gallop, murmur, click, rub or other extra sounds. Extremities:     No clubbing, cyanosis, edema, or deformity noted with normal full range of motion of all joints.      Impression & Recommendations:  Problem # 1:  MOLE (ICD-216.9) Again, looks more like neurofibromatosis. Will send  to dermatology for removal.  Future Orders: Dermatology Referral (Derma) ... 12/27/2006   Problem # 2:  DIABETES MELLITUS (ICD-250.00) Excellent A1C on metformin. Microalbumin today.  Her updated medication list for this problem includes:    Aspirin Ec Extra Strength 500 Mg Tbec (Aspirin) .Marland Kitchen... Take 1 tablet by mouth once a day    Lotensin 10 Mg Tabs (Benazepril hcl) .Marland Kitchen... Take 1 tablet by mouth once a day    Glucophage 850 Mg Tabs (Metformin hcl) .Marland Kitchen... Take 1 tablet by mouth two times a day  Orders: T- Capillary Blood Glucose (45409) T-Hgb A1C (in-house) (81191YN) T-Comprehensive Metabolic Panel (82956-21308) T-CBC No Diff (65784-69629) T-TSH (52841-32440) T-Lipid Profile (10272-53664) T-Urine Microalbumin w/creat. ratio [phone] / [phone])  Labs Reviewed: HgBA1c: 6.2 (10/07/2006)   Creat: 1.04 (04/22/2006)      Problem # 3:  HYPOTHYROIDISM NOS (ICD-244.9) TSH today. If ok will recheck on an annual basis, since on a stable dose of synthroid.  Her updated medication list for this problem includes:    Synthroid 100 Mcg Tabs (Levothyroxine sodium) .Marland Kitchen... Take 1 tablet by mouth once a day  Orders: T-Comprehensive Metabolic Panel (56433-29518) T-CBC No Diff (84166-06301) T-TSH (60109-32355) T-Lipid Profile (  [phone])  Labs Reviewed: TSH: 1.514 (04/22/2006)    HgBA1c: 6.2 (10/07/2006)   Problem # 4:  DYSLIPIDEMIA (ICD-272.4) On zocor. FLP today.  Her updated medication list for this problem includes:    Zocor 40 Mg Tabs (Simvastatin) .Marland Kitchen... Take 1 tablet by mouth once a day  Orders: T-Comprehensive Metabolic Panel (09811-91478) T-CBC No Diff (29562-13086) T-TSH (57846-96295) T-Lipid Profile (28413-24401)   Problem # 5:  HYPERTENSION (ICD-401.9) BP on MD recheck 135/80. Continue current regimen.  Her updated medication list for this problem includes:    Lasix 40 Mg Tabs (Furosemide) .Marland Kitchen... Take 1 tablet by mouth once a day    Lotensin 10 Mg Tabs (Benazepril  hcl) .Marland Kitchen... Take 1 tablet by mouth once a day    Coreg 12.5 Mg Tabs (Carvedilol) .Marland Kitchen... Take 1 tablet by mouth two times a day  Orders: T-Comprehensive Metabolic Panel [phone]) T-CBC No Diff (03474-25956) T-TSH (38756-43329) T-Lipid Profile (51884-16606)  BP today: 154/79 Prior BP: 116/73 (04/22/2006)  Labs Reviewed: Creat: 1.04 (04/22/2006)   Problem # 6:  OSTEOARTHRITIS (ICD-715.90) Pain well tolerated on darvocet and aleve.  Her updated medication list for this problem includes:    Aspirin Ec Extra Strength 500 Mg Tbec (Aspirin) .Marland Kitchen... Take 1 tablet by mouth once a day    Darvocet-n 100 Tabs (Propoxyphene n-apap tabs) ..... One tab every four hours as needed   Problem # 7:  HEALTH MAINTENANCE EXAM (ICD-V70.0) Per patient, up to date on mammograms: will need to get records of last one. Had colonoscopy 2 years ago, per patient report normal, will get records. FLP today. Will need to inquire about PAP smear status on next visit.   Complete Medication List: 1)  Synthroid 100 Mcg Tabs (Levothyroxine sodium) .... Take 1 tablet by mouth once a day 2)  Aspirin Ec Extra Strength 500 Mg Tbec (Aspirin) .... Take 1 tablet by mouth once a day 3)  Lasix 40 Mg Tabs (Furosemide) .... Take 1 tablet by mouth once a day 4)  Lotensin 10 Mg Tabs (Benazepril hcl) .... Take 1 tablet by mouth once a day 5)  Coreg 12.5 Mg Tabs (Carvedilol) .... Take 1 tablet by mouth two times a day 6)  Zocor 40 Mg Tabs (Simvastatin) .... Take 1 tablet by mouth once a day 7)  Darvocet-n 100 Tabs (Propoxyphene n-apap tabs) .... One tab every four hours as needed 8)  Glucophage 850 Mg Tabs (Metformin hcl) .... Take 1 tablet by mouth two times a day 9)  Nexium 40 Mg Cpdr (Esomeprazole magnesium) .... Take 1 tablet by mouth once a day 10)  Trazodone Hcl 50 Mg Tabs (Trazodone hcl) .... Take 1 tab by mouth at bedtime   Patient Instructions: 1)  Please schedule a follow-up appointment in 1  month.    Prescriptions: TRAZODONE HCL 50 MG  TABS (TRAZODONE HCL) Take 1 tab by mouth at bedtime  #31 x 3   Entered and Authorized by:   Caitlyn Pitt MD   Signed by:   Caitlyn Pitt MD on 10/07/2006   Method used:   Print then Give to Patient   RxID:   [id number] AMBIEN 5 MG TABS (ZOLPIDEM TARTRATE) one tablet at bedtime as needed for insomnia  #30 x 4   Entered and Authorized by:   Caitlyn Pitt MD   Signed by:   Caitlyn Pitt MD on 10/07/2006   Method used:   Print then Give to Patient   RxID:   [id number] NEXIUM 40 MG  CPDR (ESOMEPRAZOLE  MAGNESIUM) Take 1 tablet by mouth once a day  #31 x 5   Entered and Authorized by:   Caitlyn Pitt MD   Signed by:   Caitlyn Pitt MD on 10/07/2006   Method used:   Print then Give to Patient   RxID:   [id number] GLUCOPHAGE 850 MG TABS (METFORMIN HCL) Take 1 tablet by mouth two times a day  #62 x 5   Entered and Authorized by:   Caitlyn Pitt MD   Signed by:   Caitlyn Pitt MD on 10/07/2006   Method used:   Print then Give to Patient   RxID:   [id number] DARVOCET-N 100  TABS (PROPOXYPHENE N-APAP TABS) One tab every four hours as needed  #180 x 2   Entered and Authorized by:   Caitlyn Pitt MD   Signed by:   Caitlyn Pitt MD on 10/07/2006   Method used:   Print then Give to Patient   RxID:   [id number] ZOCOR 40 MG TABS (SIMVASTATIN) Take 1 tablet by mouth once a day  #31 x 5   Entered and Authorized by:   Caitlyn Pitt MD   Signed by:   Caitlyn Pitt MD on 10/07/2006   Method used:   Print then Give to Patient   RxID:   [id number] COREG 12.5 MG  TABS (CARVEDILOL) Take 1 tablet by mouth two times a day  #62 x 5   Entered and Authorized by:   Caitlyn Pitt MD   Signed by:   Caitlyn Pitt MD on 10/07/2006   Method used:   Print then Give to Patient   RxID:   [id number] LOTENSIN 10 MG TABS (BENAZEPRIL HCL) Take 1 tablet by mouth once a day  #31 x 5   Entered  and Authorized by:   Caitlyn Pitt MD   Signed by:   Caitlyn Pitt MD on 10/07/2006   Method used:   Print then Give to Patient   RxID:   [id number] LASIX 40 MG TABS (FUROSEMIDE) Take 1 tablet by mouth once a day  #31 x 5   Entered and Authorized by:   Caitlyn Pitt MD   Signed by:   Caitlyn Pitt MD on 10/07/2006   Method used:   Print then Give to Patient   RxID:   [id number] ASPIRIN EC EXTRA STRENGTH 500 MG TBEC (ASPIRIN) Take 1 tablet by mouth once a day  #31 x 5   Entered and Authorized by:   Caitlyn Pitt MD   Signed by:   Caitlyn Pitt MD on 10/07/2006   Method used:   Print then Give to Patient   RxID:   [id number] SYNTHROID 100 MCG TABS (LEVOTHYROXINE SODIUM) Take 1 tablet by mouth once a day  #31 x 5   Entered and Authorized by:   Caitlyn Pitt MD   Signed by:   Caitlyn Pitt MD on 10/07/2006   Method used:   Print then Give to Patient   RxID:   [id number]           Diabetic Foot Exam Foot Inspection Is there a history of a foot ulcer?              No Is there a foot ulcer now?              No Can the patient see the bottom of their feet?          Yes Are the shoes appropriate in style and fit?  Yes Is there swelling or an abnormal foot shape?          Yes Are the toenails long?                No Are the toenails thick?                No Are the toenails ingrown?              No Is there heavy callous build-up?              No Is there a claw toe deformity?                          Yes Is there elevated skin temperature?            No Is there foot or ankle muscle weakness?            No Do you have pain in calf while walking?           No      Diabetic Foot Care Education :Patient educated on appropriate care of diabetic feet.  Pulse Check          Right Foot          Left Foot Posterior Tibial:        3+            3+ Dorsalis Pedis:        3+            3+  High Risk Feet? No   10-g (5.07)  Semmes-Weinstein Monofilament Test Performed by: Chinita Pester RN          Right Foot          Left Foot Site 1         normal         normal Site 4         normal         normal Site 5         normal         normal Site 6         normal         normal   Laboratory Results   Blood Tests   Date/Time Recieved: October 07, 2006 11:20 AM  Date/Time Reported: ..................................................................Marland KitchenAlric Quan  October 07, 2006 11:20 AM   HGBA1C: 6.2%   (Normal Range: Non-Diabetic - 3-6%   Control Diabetic - 6-8%) CBG Random: 111

## 2010-03-04 NOTE — Progress Notes (Signed)
Summary: Pain  Phone Note Call from Patient   Caller: Patient Call For: Caitlyn Aguirre Stable MD Summary of Call: Call from pt aching pain under ribs.  Unable to sleep.  Pt given appointment for this pm. Angelina Ok, RN September 30, 10:10 AM Initial call taken by: Angelina Ok RN, November 02, 2007 10:10 AM      Appended Document: Pain Pt seen yesterday afternoon, see office note.
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
## 2010-03-04 NOTE — Progress Notes (Signed)
Summary: refill/ hla  Phone Note Refill Request Message from:  Fax from Pharmacy on December 08, 2007 12:01 PM  Refills Requested: Medication #1:  LASIX 40 MG TABS Take 1 tablet by mouth once a day   Last Refilled: 10/1 Initial call taken by: Marin Roberts RN,  December 08, 2007 12:01 PM  Follow-up for Phone Call        Refill approved-nurse to complete Follow-up by: Ulyess Mort MD,  December 08, 2007 3:20 PM      Prescriptions: LASIX 40 MG TABS (FUROSEMIDE) Take 1 tablet by mouth once a day  #31 x 1   Entered and Authorized by:   Ulyess Mort MD   Signed by:   Ulyess Mort MD on 12/08/2007   Method used:   Electronically to        [facility] (retail)       [address]       [place], [place]  [id number]       Ph: [phone]       Fax: [phone]   RxID:   [id number]

## 2010-03-04 NOTE — Letter (Signed)
Summary: Handout Printed  Printed Handout:  - Hypertension, Easy-to-Read
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
## 2010-03-04 NOTE — Progress Notes (Signed)
Summary: Refill/gh  Phone Note Refill Request Message from:  Patient on March 27, 2008 9:21 AM  Refills Requested: Medication #1:  COREG 12.5 MG  TABS Take 1 tablet by mouth two times a day  Medication #2:  SIMVASTATIN 80 MG  TABS Take 1 tablet by mouth once a day  Medication #3:  GLUCOPHAGE 850 MG TABS Take 1 tablet by mouth two times a day  Medication #4:  NEXIUM 40 MG  CPDR Take 1 tablet by mouth once a day Pt needs 3 month refills.  New pharmacy.   Method Requested: Elecrtonic Initial call taken by: Angelina Ok RN,  March 27, 2008 9:21 AM  Follow-up for Phone Call        Sent electronically. Follow-up by: Mariea Stable MD,  March 27, 2008 11:01 AM      Prescriptions: NEXIUM 40 MG  CPDR (ESOMEPRAZOLE MAGNESIUM) Take 1 tablet by mouth once a day  #90 x 4   Entered and Authorized by:   Mariea Stable MD   Signed by:   Mariea Stable MD on 03/27/2008   Method used:   Electronically to        [facility]* (mail-order)       [address]       [place], [place]  [address]       Ph: [phone]       Fax: [phone]   RxID:   [id number] GLUCOPHAGE 850 MG TABS (METFORMIN HCL) Take 1 tablet by mouth two times a day  #180 x 4   Entered and Authorized by:   Mariea Stable MD   Signed by:   Mariea Stable MD on 03/27/2008   Method used:   Electronically to        [facility]* (mail-order)       [address]       [place], [place]  [address]       Ph: [phone]       Fax: [phone]   RxID:   [id number] SIMVASTATIN 80 MG  TABS (SIMVASTATIN) Take 1 tablet by mouth once a day  #90 x 4   Entered and Authorized by:   Mariea Stable MD   Signed by:   Mariea Stable MD on 03/27/2008   Method used:   Electronically to        [facility]* (mail-order)       [address]       [place], [place]  [address]       Ph: [phone]       Fax: [phone]   RxID:   [id number] COREG  12.5 MG  TABS (CARVEDILOL) Take 1 tablet by mouth two times a day  #180 x 4   Entered and Authorized by:   Mariea Stable MD   Signed by:   Mariea Stable MD on 03/27/2008   Method used:   Electronically to        [facility]* (mail-order)       [address]       [place], [place]  [address]       Ph: [phone]       Fax: [phone]   RxID:   [id number]

## 2010-03-04 NOTE — Miscellaneous (Signed)
Summary: [facility] [place] [place]  [facility] [place]   Imported By: Florinda Marker 08/22/2008 14:56:05  _____________________________________________________________________  External Attachment:    Type:   Image     Comment:   External Document

## 2010-03-04 NOTE — Progress Notes (Signed)
Summary: refill/gg  Phone Note Refill Request  on July 24, 2008 10:27 AM  Refills Requested: Medication #1:  SIMVASTATIN 80 MG  TABS Take 1 tablet by mouth once a day  Medication #2:  GLUCOPHAGE 850 MG TABS Take 1 tablet by mouth two times a day  Medication #3:  NEXIUM 40 MG  CPDR Take 1 tablet by mouth once a day  Medication #4:  AMBIEN 10 MG  TABS Take 1 tablet by mouth once a day  Method Requested: Electronic Initial call taken by: Merrie Roof RN,  July 24, 2008 10:28 AM  Follow-up for Phone Call        Refill approved-nurse to complete.  Ambien needs to be called in. Follow-up by: Mariea Stable MD,  July 25, 2008 8:12 AM  Additional Follow-up for Phone Call Additional follow up Details #1::        ambien called in Additional Follow-up by: Merrie Roof RN,  July 25, 2008 12:10 PM      Prescriptions: AMBIEN 10 MG  TABS (ZOLPIDEM TARTRATE) Take 1 tablet by mouth once a day  #31 x 3   Entered and Authorized by:   Mariea Stable MD   Signed by:   Mariea Stable MD on 07/25/2008   Method used:   Telephoned to ...       [facility] [id number]* (retail)       [address]       [place], [place]  [address]       Ph: [phone]       Fax: [phone]   RxID:   [id number] SIMVASTATIN 80 MG  TABS (SIMVASTATIN) Take 1 tablet by mouth once a day  #90 x 4   Entered and Authorized by:   Mariea Stable MD   Signed by:   Mariea Stable MD on 07/25/2008   Method used:   Electronically to        [facility] [address]* (retail)       [address] [place], [place]  [address]       Ph: [phone]       Fax: [phone]   RxID:   [id number] GLUCOPHAGE 850 MG TABS (METFORMIN HCL) Take 1 tablet by mouth two times a day  #180 x 4   Entered and Authorized by:   Mariea Stable MD   Signed by:   Mariea Stable MD on 07/25/2008   Method used:   Electronically to        [facility] [id number] (retail)       [address] [place], [place]  [address]       Ph: [phone]       Fax: [phone]   RxID:   [id number] NEXIUM 40 MG  CPDR (ESOMEPRAZOLE MAGNESIUM) Take 1 tablet by mouth once a day  #90 x 4   Entered and Authorized by:   Mariea Stable MD   Signed by:   Mariea Stable MD on 07/25/2008   Method used:   Electronically to        [facility] [address] (retail)       [address]       [place]  [place], [place]  [address]       Ph: [phone]       Fax: [phone]   RxID:   [id number]
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
## 2010-03-04 NOTE — Miscellaneous (Signed)
Summary: Hospital Discharge Update  Hospital Discharge Update  DOA: 10/25/2008 DOD: 10/26/2008  Summary: Caitlyn Aguirre is a 75 y/o woman with PMH significant for DM II, Hypothyroidism, HLD, CHF, OA who was admitted for elevated blood pressure (home BP meter read 209/90) and HA. CT scan done in the ED did not reveal any acute changes or hemorrhage, and was only positive for old lacunar infarcts. Pt was given Clonidie in ED and pressure had started to trend down. Added Lasix 20 mg once daily to current regimen. HA resolved on day 2 of hospitalization, and blood pressure was well controlled.   Please recheck blood pressure on f/u appt, after STAT BMET. Please adjust BP meds accordingly.   Complete Medication List: 1)  Synthroid 100 Mcg Tabs (Levothyroxine sodium) .... Take 1 tablet by mouth once a day 2)  Benazepril Hcl 20 Mg Tabs (Benazepril hcl) .... Take 1 tablet by mouth once a day 3)  Coreg 12.5 Mg Tabs (Carvedilol) .... Take 1 tablet by mouth two times a day 4)  Simvastatin 80 Mg Tabs (Simvastatin) .... Take 1 tablet by mouth once a day 5)  Glucophage 1000 Mg Tabs (Metformin hcl) .... Take 1 tablet by mouth two times a day 6)  Nova Max Glucose Test Strp (Glucose blood) .... Tests twice daily 7)  Lancets 30g Misc (Lancets) .... Test twice daily 8)  Ambien 10 Mg Tabs (Zolpidem tartrate) .... Take 1 tablet by mouth once a day 9)  Vicodin Es 7.5-750 Mg Tabs (Hydrocodone-acetaminophen) .... Take 1 tablet every 6 hour for pain as needed. 10)  Paxil 20 Mg Tabs (Paroxetine hcl) .... Take 1 tablet by mouth once a day 11)  Docusate Sodium 100 Mg Caps (Docusate sodium) .Marland Kitchen.. 1 tablet twice a day. 12)  Cvs Senna-extra 17.2 Mg Tabs (Sennosides) .... Take 1 tablet twice a day. 13)  Plavix 75 Mg Tabs (Clopidogrel bisulfate) .... Take 1 pill by mouth daily. 14)  Zantac 150 Mg Tabs (Ranitidine hcl) .... Take 1 pill by mouth daily. 15)  Furosemide 20 Mg Tabs (Furosemide) .... Take 1 tablet by mouth once a  day   Patient Instructions: 1)  Please follow up with Dr. Onalee Hua, Oct. 4, 2010 at 2 pm to recheck blood pressure and have blood work done.  2)  Check your Blood Pressure regularly and please take new medication Lasix 20 mg by mouth once daily. 3)  Your medication of Lotensin has also been changed to 20 mg by mouth once daily.  4)  Please take all other medications as directed.

## 2010-03-04 NOTE — Progress Notes (Signed)
Summary: Refill/gh  Phone Note Refill Request Message from:  Pharmacy on December 05, 2007 2:32 PM  Refills Requested: Medication #1:  VICODIN ES 7.5-750 MG TABS take 1 tablet every 6 hour for pain as needed. Pt is requesting a refill on her pain med.   Method Requested: Fax to Local Pharmacy Initial call taken by: Angelina Ok RN,  December 05, 2007 2:32 PM  Follow-up for Phone Call        Refill approved-nurse to complete Follow-up by: Manning Charity MD,  December 05, 2007 3:28 PM  Additional Follow-up for Phone Call Additional follow up Details #1::        Rx called to pharmacy Additional Follow-up by: Angelina Ok RN,  December 07, 2007 3:00 PM      Prescriptions: VICODIN ES 7.5-750 MG TABS (HYDROCODONE-ACETAMINOPHEN) take 1 tablet every 6 hour for pain as needed.  #90 x 0   Entered and Authorized by:   Manning Charity MD   Signed by:   Manning Charity MD on 12/05/2007   Method used:   Telephoned to ...       [facility] (retail)       [address]       [place], [place]  [id number]       Ph: [phone]       Fax: [phone]   RxID:   [id number]

## 2010-03-04 NOTE — Progress Notes (Signed)
Summary: Refill/gh  Phone Note Refill Request Message from:  Pharmacy on October 04, 2007 11:22 AM  Refills Requested: Medication #1:  LOTENSIN 10 MG TABS Take 1 tablet by mouth once a day   Last Refilled: 09/02/2007  Method Requested: Fax to Local Pharmacy Initial call taken by: Angelina Ok RN,  October 04, 2007 11:22 AM  Follow-up for Phone Call       Follow-up by: Mariea Stable MD,  October 04, 2007 11:54 AM      Prescriptions: LOTENSIN 10 MG TABS (BENAZEPRIL HCL) Take 1 tablet by mouth once a day  #31 x 5   Entered and Authorized by:   Mariea Stable MD   Signed by:   Mariea Stable MD on 10/04/2007   Method used:   Electronically to        [facility] (retail)       [address]       [place], [place]  [id number]       Ph: [phone]       Fax: [phone]   RxID:   [id number]

## 2010-03-04 NOTE — Miscellaneous (Signed)
Summary: HIPAA Restrictions  HIPAA Restrictions   Imported By: Florinda Marker 11/03/2007 14:12:03  _____________________________________________________________________  External Attachment:    Type:   Image     Comment:   External Document

## 2010-03-04 NOTE — Progress Notes (Signed)
Summary: Refill/gh  Phone Note Refill Request Message from:  Patient on March 27, 2008 9:23 AM  Refills Requested: Medication #1:  NOVA MAX GLUCOSE TEST   STRP tests twice daily  Medication #2:  LANCETS 30G   MISC test twice daily  Medication #3:  AMBIEN 10 MG  TABS Take 1 tablet by mouth once a day  Medication #4:  VICODIN ES 7.5-750 MG TABS take 1 tablet every 6 hour for pain as needed. Pt needs 3 month supplies.   Method Requested: Electronic Initial call taken by: Angelina Ok RN,  March 27, 2008 9:24 AM  Follow-up for Phone Call        Refill approved-nurse to complete Follow-up by: Mariea Stable MD,  March 27, 2008 10:59 AM  Additional Follow-up for Phone Call Additional follow up Details #1::        Rx faxed to pharmacy.  Faxed to [facility] per pt request. Additional Follow-up by: Angelina Ok RN,  March 27, 2008 4:55 PM      Prescriptions: VICODIN ES 7.5-750 MG TABS (HYDROCODONE-ACETAMINOPHEN) take 1 tablet every 6 hour for pain as needed.  #90 x 0   Entered and Authorized by:   Mariea Stable MD   Signed by:   Mariea Stable MD on 03/27/2008   Method used:   Telephoned to ...       [facility] (retail)       [address]       [place], [place]  [address]       Ph: [phone]       Fax: [phone]   RxID:   [id number] AMBIEN 10 MG  TABS (ZOLPIDEM TARTRATE) Take 1 tablet by mouth once a day  #31 x 3   Entered and Authorized by:   Mariea Stable MD   Signed by:   Mariea Stable MD on 03/27/2008   Method used:   Telephoned to ...       [facility] (retail)       [address]       [place], [place]  [address]       Ph: [phone]       Fax: [phone]   RxID:   [id number] LANCETS 30G   MISC (LANCETS) test twice daily  #3 mo. supply x prn   Entered and Authorized by:   Mariea Stable MD   Signed by:   Mariea Stable MD on 03/27/2008   Method used:    Telephoned to ...       [facility] (retail)       [address]       [place], [place]  [id number]       Ph: [phone]       Fax: [phone]   RxID:   [id number] NOVA MAX GLUCOSE TEST   STRP (GLUCOSE BLOOD) tests twice daily  #3 mo. supply x prn   Entered and Authorized by:   Mariea Stable MD   Signed by:   Mariea Stable MD on 03/27/2008   Method used:   Telephoned to ...       [facility] (retail)       [address]       [place], [place]  [id number]       Ph: [phone]       Fax: [phone]   RxID:   [id number]

## 2010-03-04 NOTE — Progress Notes (Signed)
Summary: refill/gh  Phone Note Refill Request Message from:  Fax from Pharmacy on March 05, 2006 11:59 AM  Refills Requested: Medication #1:  Diclofenac 75mg   1 BID   Last Refilled: 02/06/2006 see pt note on 12/21/05  Initial call taken by: Angelina Ok RN,  March 05, 2006 12:01 PM  Follow-up for Phone Call        Please ask patient if she still has neck pain for which she was initially prescibed diclofenac. If she does not have pain, she does not need diclofenac anymore. ..................................................................Marland KitchenEllie Lunch MD  March 05, 2006 12:18 PM   Additional Follow-up for Phone Call Additional follow up Details #1::        Return call to pt line busy...................................................................Marland KitchenAngelina Ok RN  March 05, 2006 1:07 PM  Mailbox for phone number on chart is full.  Faxed refill request to pharmacy instructing them to ask the patient to call the [facility]  triage nurse  ..................................................................Marland KitchenDorene Sorrow RN  March 09, 2006 6:19 PM  Additional Follow-up by: Angelina Ok RN,  March 05, 2006 1:07 PM   Additional Follow-up for Phone Call Additional follow up Details #2::    Received fax from pharmacy today, 03/18/06, requesting refill for Tramadol 50mg  #80, Take one tablet every 4 hours as needed for pain.  I was able to reach the patient at a phone number on the fax; she complains of arthritic pain and her requests for refills on Diclofenac and Tramadol are for this pain.  She denies neck pain. Pt saw Dr. Darrick Huntsman 12/21/05 for neck pain/muscle strain and was given script for Diclofenac to use instead of Darvocet and ibuprofen, which were prescribed 09/23/05 for arthiritic pain. Which medication can the patient use for arthritic pain?  Follow-up by: Dorene Sorrow RN,  March 18, 2006 2:30 PM  Additional Follow-up for Phone Call Additional follow up Details #3:: Details for  Additional Follow-up Action Taken: Will refill diclofenac which I find in Dr. Melina Schools note.  The tramadol was prescribed by Dr. Laurell Roof (presumably several years ago).  If patient wants both, ask Dr. Darrick Huntsman ..................................................................Marland KitchenUlyess Mort MD  March 18, 2006 3:58 PM  Diclofenac 75mg  two times a day #62 with 2 refills. ..................................................................Marland KitchenUlyess Mort MD  March 18, 2006 4:00 PM   Rx faxed to pharmacy. ..................................................................Marland KitchenDorene Sorrow RN  March 18, 2006 4:22 PM

## 2010-03-04 NOTE — Progress Notes (Signed)
Summary: refill/hla  Phone Note Refill Request Message from:  Fax from Pharmacy on December 22, 2006 12:26 PM  please add to prob/ med list along w/ # of times daily that pt tests, DC/nova max test strips tests twice daily and 30g sms lancets   Initial call taken by: Marin Roberts RN,  December 22, 2006 12:32 PM  Follow-up for Phone Call        Medication list updated  Follow-up by: Manning Charity MD,  December 22, 2006 3:26 PM    New/Updated Medications: NOVA MAX GLUCOSE TEST   STRP (GLUCOSE BLOOD)  LANCETS 30G   MISC (LANCETS)

## 2010-03-04 NOTE — Progress Notes (Signed)
Summary: Appointment  Phone Note Call from Patient   Caller: Spouse Call For: Caitlyn Stable MD Summary of Call: Call from pt's daughter to fingd out about the surgery in [facility].  The daoghter wanted to set up an appointment to meet with Dr. Onalee Hua.  RTC to the daughter spoke with pt's son-in- law.  Wxplained to the son-in-law that pt is going to [facility] to see an ENT to evaluate the mass in her neck.  No surgery has been scheduled at this time and the ENT in [facility] will make the recommendation if needed.  Suggestion that the daughter go with the patient to that appointment.  The son-in-law said that members of the patient's family plan to go to the appointment.  Also stressed that Dr. Onalee Hua wanted to make sure that the pt had follow up for the mass after not going to the appointment that was scheduled with an ENT in [place].  Son-in-law-voiced and understanding of the plan and will inform the patient's daughter.  Son- in-law awas asked to call back if there were further questions.Angelina Ok RN  January 06, 2010 4:04 PM  Initial call taken by: Angelina Ok RN,  January 06, 2010 4:04 PM  Follow-up for Phone Call        Agree and thank you. Follow-up by: Caitlyn Stable MD,  January 07, 2010 7:18 PM

## 2010-03-04 NOTE — Progress Notes (Signed)
Summary: appt/ hla  Phone Note Call from Patient   Caller: Daughter Summary of Call: daughter calls states pt was in [place] visiting and had an episode of weakness, "passing out" and slurred speech afterward, was taken to a local hospital and according to daughter was told to come straight back to [place] and see her pcp today, appt is made w/ dr Gwenlyn Perking for this pm, pt's daughter was instructed to bring name and ph #, fax # of hosp for obtaining records, she is agreeable Initial call taken by: Marin Roberts RN,  August 21, 2008 9:24 AM
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
## 2010-03-06 NOTE — Progress Notes (Signed)
Summary: med refill/gp  Phone Note Refill Request Message from:  Fax from Pharmacy on February 11, 2010 11:10 AM  Refills Requested: Medication #1:  COREG 25 MG TABS Take one tablet by mouth two times a day  Method Requested: Fax to Local Pharmacy Initial call taken by: Chinita Pester RN,  February 11, 2010 11:10 AM  Follow-up for Phone Call        Rx faxed to pharmacy Follow-up by: Mariea Stable MD,  February 13, 2010 4:20 PM    Prescriptions: COREG 25 MG TABS (CARVEDILOL) Take one tablet by mouth two times a day  #60 x 3   Entered and Authorized by:   Mariea Stable MD   Signed by:   Mariea Stable MD on 02/13/2010   Method used:   Faxed to ...       [facility] (mail-order)       [address]       [place], [place]  [address]       Ph: [phone]       Fax: [phone]   RxID:   [id number]

## 2010-03-06 NOTE — Medication Information (Signed)
Summary: [facility]  [facility]   Imported By: Margie Billet 12/17/2009 10:05:21  _____________________________________________________________________  External Attachment:    Type:   Image     Comment:   External Document

## 2010-03-10 ENCOUNTER — Other Ambulatory Visit: Payer: Self-pay | Admitting: Internal Medicine

## 2010-03-12 ENCOUNTER — Other Ambulatory Visit: Payer: Self-pay | Admitting: Internal Medicine

## 2010-03-12 ENCOUNTER — Encounter: Payer: Self-pay | Admitting: Internal Medicine

## 2010-03-12 ENCOUNTER — Ambulatory Visit (INDEPENDENT_AMBULATORY_CARE_PROVIDER_SITE_OTHER): Payer: MEDICARE | Admitting: Internal Medicine

## 2010-03-12 VITALS — BP 150/81 | HR 76 | Temp 97.7°F | Ht 68.0 in | Wt 223.9 lb

## 2010-03-12 DIAGNOSIS — M25519 Pain in unspecified shoulder: Secondary | ICD-10-CM

## 2010-03-12 DIAGNOSIS — E119 Type 2 diabetes mellitus without complications: Secondary | ICD-10-CM

## 2010-03-12 DIAGNOSIS — R930 Abnormal findings on diagnostic imaging of skull and head, not elsewhere classified: Secondary | ICD-10-CM

## 2010-03-12 DIAGNOSIS — E039 Hypothyroidism, unspecified: Secondary | ICD-10-CM

## 2010-03-12 DIAGNOSIS — K219 Gastro-esophageal reflux disease without esophagitis: Secondary | ICD-10-CM

## 2010-03-12 DIAGNOSIS — Z23 Encounter for immunization: Secondary | ICD-10-CM

## 2010-03-12 DIAGNOSIS — I10 Essential (primary) hypertension: Secondary | ICD-10-CM

## 2010-03-12 DIAGNOSIS — F3289 Other specified depressive episodes: Secondary | ICD-10-CM

## 2010-03-12 DIAGNOSIS — E785 Hyperlipidemia, unspecified: Secondary | ICD-10-CM

## 2010-03-12 DIAGNOSIS — Z0000 Encounter for general adult medical examination without abnormal findings: Secondary | ICD-10-CM

## 2010-03-12 DIAGNOSIS — F329 Major depressive disorder, single episode, unspecified: Secondary | ICD-10-CM

## 2010-03-12 LAB — POCT GLYCOSYLATED HEMOGLOBIN (HGB A1C): Hemoglobin A1C: 7.1

## 2010-03-12 LAB — POCT CBG (FASTING - GLUCOSE)-MANUAL ENTRY: Glucose Fasting, POC: 181 mg/dL

## 2010-03-12 LAB — GLUCOSE, CAPILLARY: Glucose-Capillary: 181 mg/dL — ABNORMAL HIGH (ref 70–99)

## 2010-03-12 MED ORDER — HYDROCODONE-ACETAMINOPHEN 5-500 MG PO TABS: 1.0000 | ORAL_TABLET | Freq: Three times a day (TID) | ORAL | Status: DC | PRN

## 2010-03-12 MED ORDER — DICLOFENAC SODIUM 1 % TD GEL: TRANSDERMAL | Status: DC

## 2010-03-12 NOTE — Assessment & Plan Note (Signed)
Stable, well controlled on current regimen.  No changes.

## 2010-03-12 NOTE — Assessment & Plan Note (Signed)
Pt reports much better symptom control with PPI.  Will have her try OTC PPI.  Consider stopping H2 blocker next time if taking PPI

## 2010-03-12 NOTE — Assessment & Plan Note (Signed)
Ongoing.  Did not see sports medicine.  Tramadol of minimal relief.  Will try vicodin, voltaren gel and PT eval

## 2010-03-12 NOTE — Assessment & Plan Note (Signed)
Slightly above goal.  ACE-I stopped in past due to potential source of cough.  Will continue to monitor as not far from goal, especially given pt's age.

## 2010-03-12 NOTE — Assessment & Plan Note (Signed)
Statin decreased last visit due to addition of amlodipine. Will continue with current regimen and check lipids/LFTs next visit as pt prefers to avoid further labs today.

## 2010-03-12 NOTE — Assessment & Plan Note (Signed)
Will check TSH next visit, wnl less than 1 year ago.

## 2010-03-12 NOTE — Assessment & Plan Note (Signed)
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  Pt reports having been evaluated at [facility] and told everything was fine.  Will request notes.

## 2010-03-12 NOTE — Patient Instructions (Signed)
Follow up in 3-6 months. Try over the counter omeprazole, if it works, can stop ranitidine. Will prescribe vicodin and voltaren gel for your shoulder. Will refer to physical therapy for your shoulder.  Call clinic if any problems before your next visit. Will need to check labs next visit.

## 2010-03-12 NOTE — Progress Notes (Signed)
  Subjective:    Patient ID: Caitlyn Aguirre, female    DOB: 15-Jul-1935, 75 y.o.   MRN: [id number]  HPI Here for routine visit without any new complaints.    Last office visit with right shoulder pain thought to be rotator cuff in origin.  Given vicodin and voltaren gel and referred to sports med for further eval and possible injections.  However, pt states that she did not want injections and therefore didn't go to appointment.  Still having right shoulder pain.  Uses tramadol but only of little help.  Has limited ROM, especially when trying to put her shirt on with hands over head or arm behind back.  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  However, pt did not go to appt because of financial reasons.  An appointment was then made at [facility] for 01/14/10 with Dr. Lendell Caprice.  She reports having been evaluated and was told everything was fine.       Review of Systems  Constitutional: Negative for fever, activity change, fatigue and unexpected weight change.  HENT: Negative for ear pain and neck stiffness.   Eyes: Negative for visual disturbance.  Respiratory: Negative for chest tightness and shortness of breath.   Cardiovascular: Negative for chest pain and palpitations.  Genitourinary: Negative for dysuria.  Musculoskeletal: Positive for arthralgias.  Neurological: Negative for weakness.       Objective:   Physical Exam  Constitutional: She is oriented to person, place, and time. She appears well-developed and well-nourished. No distress.  Eyes: Pupils are equal, round, and reactive to light. Right eye exhibits no discharge. Left eye exhibits no discharge. No scleral icterus.  Neck: Neck supple. No JVD present.  Cardiovascular: Normal rate, regular rhythm, normal heart sounds and intact distal pulses.  Exam reveals no gallop and no friction rub.   No murmur heard. Pulmonary/Chest: Effort normal and breath sounds normal. No respiratory distress. She  has no wheezes. She has no rales.  Abdominal: Soft. Bowel sounds are normal. There is no tenderness.  Musculoskeletal: She exhibits no edema and no tenderness.  Neurological: She is alert and oriented to person, place, and time. No cranial nerve deficit.  Skin: She is not diaphoretic.  Psychiatric: She has a normal mood and affect.          Assessment & Plan:

## 2010-03-12 NOTE — Assessment & Plan Note (Signed)
Stable  Continue with current regimen

## 2010-03-20 ENCOUNTER — Telehealth: Payer: Self-pay | Admitting: *Deleted

## 2010-03-20 ENCOUNTER — Encounter: Payer: Self-pay | Admitting: Internal Medicine

## 2010-03-20 ENCOUNTER — Ambulatory Visit (INDEPENDENT_AMBULATORY_CARE_PROVIDER_SITE_OTHER): Payer: MEDICARE | Admitting: Internal Medicine

## 2010-03-20 VITALS — BP 148/89 | HR 84 | Temp 98.3°F | Wt 220.9 lb

## 2010-03-20 DIAGNOSIS — H6093 Unspecified otitis externa, bilateral: Secondary | ICD-10-CM

## 2010-03-20 DIAGNOSIS — H60399 Other infective otitis externa, unspecified ear: Secondary | ICD-10-CM

## 2010-03-20 MED ORDER — NEOMYCIN-POLYMYXIN-HC 3.5-10000-1 OT SOLN
3.0000 [drp] | Freq: Four times a day (QID) | OTIC | Status: DC
Start: 2010-03-20 — End: 2010-03-20

## 2010-03-20 MED ORDER — NEOMYCIN-POLYMYXIN-HC 3.5-10000-1 OT SOLN: 3.0000 [drp] | Freq: Four times a day (QID) | OTIC | Status: AC

## 2010-03-20 NOTE — Progress Notes (Signed)
Addended byDonia Guiles on: 03/20/2010 04:17 PM   Modules accepted: Orders

## 2010-03-20 NOTE — Telephone Encounter (Signed)
Pt called with bil ear problem.  She feels like ears are  swollen and  something is in them.  Will see today

## 2010-03-20 NOTE — Progress Notes (Signed)
  Subjective:    Patient ID: Caitlyn Aguirre, female    DOB: February 01, 1936, 75 y.o.   MRN: [id number]  HPI  Ms.  Caitlyn Aguirre is a 75 year old woman with a history of external ear  canal pain that has been smoldering for the last several months.   she presents for clinical evaluation today because she has had an increase in pain on both ear canals.   she describes the pain as burning and itching , with occasional stabbing sensations. She also has noticed some increased difficulty hearing.   no other symptoms which can be ascribed  To  Allergic rhinitis or conjunctivitis.   Review of Systems  no fevers chills nausea vomiting , and no symptoms of sore throat.    Objective:   Physical Exam  physical exam is notable for somewhat erythematous external ear canals , with moderate edema. Tympanic membranes are normal bilaterally.   I see no skin changes suggestive of  Seborhrrea,  In either ear canals , or face.       Assessment & Plan:   otitis externa bilaterally   plan :          Cortisporin otic , 2 drops each year 4 times a day.   followup with me in 2 weeks to reevaluate.

## 2010-03-26 ENCOUNTER — Ambulatory Visit: Payer: MEDICARE | Attending: Internal Medicine | Admitting: Physical Therapy

## 2010-03-26 DIAGNOSIS — M25619 Stiffness of unspecified shoulder, not elsewhere classified: Secondary | ICD-10-CM | POA: Insufficient documentation

## 2010-03-26 DIAGNOSIS — IMO0001 Reserved for inherently not codable concepts without codable children: Secondary | ICD-10-CM | POA: Insufficient documentation

## 2010-03-26 DIAGNOSIS — M25519 Pain in unspecified shoulder: Secondary | ICD-10-CM | POA: Insufficient documentation

## 2010-04-04 ENCOUNTER — Other Ambulatory Visit: Payer: Self-pay | Admitting: Internal Medicine

## 2010-04-09 ENCOUNTER — Encounter: Payer: Self-pay | Admitting: Internal Medicine

## 2010-04-09 ENCOUNTER — Ambulatory Visit (INDEPENDENT_AMBULATORY_CARE_PROVIDER_SITE_OTHER): Payer: MEDICARE | Admitting: Internal Medicine

## 2010-04-09 DIAGNOSIS — I10 Essential (primary) hypertension: Secondary | ICD-10-CM

## 2010-04-09 DIAGNOSIS — H60399 Other infective otitis externa, unspecified ear: Secondary | ICD-10-CM

## 2010-04-09 DIAGNOSIS — H6093 Unspecified otitis externa, bilateral: Secondary | ICD-10-CM

## 2010-04-09 DIAGNOSIS — E039 Hypothyroidism, unspecified: Secondary | ICD-10-CM

## 2010-04-09 DIAGNOSIS — E785 Hyperlipidemia, unspecified: Secondary | ICD-10-CM

## 2010-04-09 LAB — LIPID PANEL
Cholesterol: 206 mg/dL — ABNORMAL HIGH (ref 0–200)
HDL: 53 mg/dL (ref >39)
LDL Cholesterol: 106 mg/dL — ABNORMAL HIGH (ref 0–99)
Total CHOL/HDL Ratio: 3.9 Ratio
Triglycerides: 237 mg/dL — ABNORMAL HIGH (ref <150)
VLDL: 47 mg/dL — ABNORMAL HIGH (ref 0–40)

## 2010-04-09 LAB — TSH: TSH: 2.813 u[IU]/mL (ref 0.350–4.500)

## 2010-04-09 NOTE — Assessment & Plan Note (Signed)
LDL above goal <100. Last FLP checked 8 months ago. Check FLP today and reassess need to increase statin.

## 2010-04-09 NOTE — Assessment & Plan Note (Signed)
Resolved

## 2010-04-09 NOTE — Assessment & Plan Note (Signed)
Will check TSH and reassess.

## 2010-04-09 NOTE — Patient Instructions (Signed)
Make follow up appointment in 2 months. Continue taking all medication as directed.

## 2010-04-09 NOTE — Assessment & Plan Note (Signed)
At goal. Continue current regimen. 
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
## 2010-04-11 ENCOUNTER — Other Ambulatory Visit: Payer: Self-pay | Admitting: *Deleted

## 2010-04-11 MED ORDER — ZOLPIDEM TARTRATE 10 MG PO TABS: ORAL_TABLET | ORAL | Status: DC

## 2010-04-14 NOTE — Telephone Encounter (Signed)
Refill faxed to the [facility].

## 2010-04-16 LAB — GLUCOSE, CAPILLARY: Glucose-Capillary: 96 mg/dL (ref 70–99)

## 2010-04-17 LAB — GLUCOSE, CAPILLARY: Glucose-Capillary: 178 mg/dL — ABNORMAL HIGH (ref 70–99)

## 2010-04-18 LAB — POCT I-STAT, CHEM 8
BUN: 6 mg/dL (ref 6–23)
Calcium, Ion: 1.18 mmol/L (ref 1.12–1.32)
Chloride: 100 mEq/L (ref 96–112)
Creatinine, Ser: 0.9 mg/dL (ref 0.4–1.2)
Glucose, Bld: 160 mg/dL — ABNORMAL HIGH (ref 70–99)
HCT: 38 % (ref 36.0–46.0)
Hemoglobin: 12.9 g/dL (ref 12.0–15.0)
Potassium: 2.7 mEq/L — CL (ref 3.5–5.1)
Sodium: 144 mEq/L (ref 135–145)
TCO2: 31 mmol/L (ref 0–100)

## 2010-04-18 LAB — CBC
HCT: 35.7 % — ABNORMAL LOW (ref 36.0–46.0)
Hemoglobin: 12.1 g/dL (ref 12.0–15.0)
MCH: 30 pg (ref 26.0–34.0)
MCHC: 33.9 g/dL (ref 30.0–36.0)
MCV: 88.4 fL (ref 78.0–100.0)
Platelets: 176 10*3/uL (ref 150–400)
RBC: 4.04 MIL/uL (ref 3.87–5.11)
RDW: 13.4 % (ref 11.5–15.5)
WBC: 5.7 10*3/uL (ref 4.0–10.5)

## 2010-04-18 LAB — URINALYSIS, ROUTINE W REFLEX MICROSCOPIC
Bilirubin Urine: NEGATIVE
Glucose, UA: NEGATIVE mg/dL
Hgb urine dipstick: NEGATIVE
Ketones, ur: NEGATIVE mg/dL
Nitrite: NEGATIVE
Protein, ur: NEGATIVE mg/dL
Specific Gravity, Urine: 1.017 (ref 1.005–1.030)
Urobilinogen, UA: 1 mg/dL (ref 0.0–1.0)
pH: 6 (ref 5.0–8.0)

## 2010-04-18 LAB — DIFFERENTIAL
Basophils Absolute: 0 10*3/uL (ref 0.0–0.1)
Basophils Relative: 0 % (ref 0–1)
Eosinophils Absolute: 0.3 10*3/uL (ref 0.0–0.7)
Eosinophils Relative: 5 % (ref 0–5)
Lymphocytes Relative: 23 % (ref 12–46)
Lymphs Abs: 1.3 10*3/uL (ref 0.7–4.0)
Monocytes Absolute: 0.6 10*3/uL (ref 0.1–1.0)
Monocytes Relative: 10 % (ref 3–12)
Neutro Abs: 3.5 10*3/uL (ref 1.7–7.7)
Neutrophils Relative %: 62 % (ref 43–77)

## 2010-04-18 LAB — GLUCOSE, CAPILLARY: Glucose-Capillary: 196 mg/dL — ABNORMAL HIGH (ref 70–99)

## 2010-04-19 LAB — GLUCOSE, CAPILLARY: Glucose-Capillary: 198 mg/dL — ABNORMAL HIGH (ref 70–99)

## 2010-04-28 LAB — GLUCOSE, CAPILLARY: Glucose-Capillary: 149 mg/dL — ABNORMAL HIGH (ref 70–99)

## 2010-05-09 ENCOUNTER — Other Ambulatory Visit: Payer: Self-pay | Admitting: Internal Medicine

## 2010-05-09 LAB — BASIC METABOLIC PANEL
BUN: 4 mg/dL — ABNORMAL LOW (ref 6–23)
BUN: 6 mg/dL (ref 6–23)
CO2: 26 mEq/L (ref 19–32)
CO2: 30 mEq/L (ref 19–32)
Calcium: 10.3 mg/dL (ref 8.4–10.5)
Calcium: 9.1 mg/dL (ref 8.4–10.5)
Chloride: 106 mEq/L (ref 96–112)
Chloride: 109 mEq/L (ref 96–112)
Creatinine, Ser: 0.77 mg/dL (ref 0.4–1.2)
Creatinine, Ser: 0.83 mg/dL (ref 0.4–1.2)
GFR calc Af Amer: >60 mL/min (ref >60)
GFR calc Af Amer: >60 mL/min (ref >60)
GFR calc non Af Amer: >60 mL/min (ref >60)
GFR calc non Af Amer: >60 mL/min (ref >60)
Glucose, Bld: 142 mg/dL — ABNORMAL HIGH (ref 70–99)
Glucose, Bld: 150 mg/dL — ABNORMAL HIGH (ref 70–99)
Potassium: 3.4 mEq/L — ABNORMAL LOW (ref 3.5–5.1)
Potassium: 3.6 mEq/L (ref 3.5–5.1)
Sodium: 142 mEq/L (ref 135–145)
Sodium: 143 mEq/L (ref 135–145)

## 2010-05-09 LAB — CARDIAC PANEL(CRET KIN+CKTOT+MB+TROPI)
CK, MB: 1.5 ng/mL (ref 0.3–4.0)
CK, MB: 1.5 ng/mL (ref 0.3–4.0)
Relative Index: 1.1 (ref 0.0–2.5)
Relative Index: 1.3 (ref 0.0–2.5)
Total CK: 117 U/L (ref 7–177)
Total CK: 131 U/L (ref 7–177)
Troponin I: 0.02 ng/mL (ref 0.00–0.06)
Troponin I: 0.02 ng/mL (ref 0.00–0.06)

## 2010-05-09 LAB — HEPATIC FUNCTION PANEL
ALT: 12 U/L (ref 0–35)
AST: 18 U/L (ref 0–37)
Albumin: 4 g/dL (ref 3.5–5.2)
Alkaline Phosphatase: 103 U/L (ref 39–117)
Bilirubin, Direct: 0.1 mg/dL (ref 0.0–0.3)
Indirect Bilirubin: 0.9 mg/dL (ref 0.3–0.9)
Total Bilirubin: 1 mg/dL (ref 0.3–1.2)
Total Protein: 7.2 g/dL (ref 6.0–8.3)

## 2010-05-09 LAB — URINALYSIS, ROUTINE W REFLEX MICROSCOPIC
Bilirubin Urine: NEGATIVE
Glucose, UA: NEGATIVE mg/dL
Hgb urine dipstick: NEGATIVE
Ketones, ur: NEGATIVE mg/dL
Nitrite: NEGATIVE
Protein, ur: NEGATIVE mg/dL
Specific Gravity, Urine: 1.016 (ref 1.005–1.030)
Urobilinogen, UA: 1 mg/dL (ref 0.0–1.0)
pH: 6.5 (ref 5.0–8.0)

## 2010-05-09 LAB — CBC
HCT: 32 % — ABNORMAL LOW (ref 36.0–46.0)
HCT: 38 % (ref 36.0–46.0)
Hemoglobin: 10.7 g/dL — ABNORMAL LOW (ref 12.0–15.0)
Hemoglobin: 12.7 g/dL (ref 12.0–15.0)
MCHC: 33.3 g/dL (ref 30.0–36.0)
MCHC: 33.3 g/dL (ref 30.0–36.0)
MCV: 90.2 fL (ref 78.0–100.0)
MCV: 91.2 fL (ref 78.0–100.0)
Platelets: 156 10*3/uL (ref 150–400)
Platelets: 205 10*3/uL (ref 150–400)
RBC: 3.51 MIL/uL — ABNORMAL LOW (ref 3.87–5.11)
RBC: 4.22 MIL/uL (ref 3.87–5.11)
RDW: 15 % (ref 11.5–15.5)
RDW: 15.2 % (ref 11.5–15.5)
WBC: 5.1 10*3/uL (ref 4.0–10.5)
WBC: 6.2 10*3/uL (ref 4.0–10.5)

## 2010-05-09 LAB — DIFFERENTIAL
Basophils Absolute: 0 10*3/uL (ref 0.0–0.1)
Basophils Relative: 0 % (ref 0–1)
Eosinophils Absolute: 0.1 10*3/uL (ref 0.0–0.7)
Eosinophils Relative: 2 % (ref 0–5)
Lymphocytes Relative: 18 % (ref 12–46)
Lymphs Abs: 1.1 10*3/uL (ref 0.7–4.0)
Monocytes Absolute: 0.4 10*3/uL (ref 0.1–1.0)
Monocytes Relative: 7 % (ref 3–12)
Neutro Abs: 4.5 10*3/uL (ref 1.7–7.7)
Neutrophils Relative %: 73 % (ref 43–77)

## 2010-05-09 LAB — GLUCOSE, CAPILLARY
Glucose-Capillary: 129 mg/dL — ABNORMAL HIGH (ref 70–99)
Glucose-Capillary: 130 mg/dL — ABNORMAL HIGH (ref 70–99)
Glucose-Capillary: 138 mg/dL — ABNORMAL HIGH (ref 70–99)

## 2010-05-09 LAB — CK TOTAL AND CKMB (NOT AT ARMC)
CK, MB: 2.5 ng/mL (ref 0.3–4.0)
Relative Index: 1.7 (ref 0.0–2.5)
Total CK: 149 U/L (ref 7–177)

## 2010-05-09 LAB — APTT: aPTT: 26 seconds (ref 24–37)

## 2010-05-09 LAB — PROTIME-INR
INR: 0.9 (ref 0.00–1.49)
Prothrombin Time: 12.5 seconds (ref 11.6–15.2)

## 2010-05-09 LAB — TSH: TSH: 1.393 u[IU]/mL (ref 0.350–4.500)

## 2010-05-09 LAB — TROPONIN I: Troponin I: 0.02 ng/mL (ref 0.00–0.06)

## 2010-05-10 LAB — GLUCOSE, CAPILLARY
Glucose-Capillary: 153 mg/dL — ABNORMAL HIGH (ref 70–99)
Glucose-Capillary: 172 mg/dL — ABNORMAL HIGH (ref 70–99)
Glucose-Capillary: 206 mg/dL — ABNORMAL HIGH (ref 70–99)
Glucose-Capillary: 64 mg/dL — ABNORMAL LOW (ref 70–99)

## 2010-05-11 LAB — CBC
HCT: 33.8 % — ABNORMAL LOW (ref 36.0–46.0)
HCT: 35.5 % — ABNORMAL LOW (ref 36.0–46.0)
HCT: 36.3 % (ref 36.0–46.0)
Hemoglobin: 11.3 g/dL — ABNORMAL LOW (ref 12.0–15.0)
Hemoglobin: 12.1 g/dL (ref 12.0–15.0)
Hemoglobin: 12.3 g/dL (ref 12.0–15.0)
MCHC: 33.4 g/dL (ref 30.0–36.0)
MCHC: 33.9 g/dL (ref 30.0–36.0)
MCHC: 34.1 g/dL (ref 30.0–36.0)
MCV: 88.2 fL (ref 78.0–100.0)
MCV: 89.5 fL (ref 78.0–100.0)
MCV: 89.7 fL (ref 78.0–100.0)
Platelets: 158 10*3/uL (ref 150–400)
Platelets: 161 10*3/uL (ref 150–400)
Platelets: 192 10*3/uL (ref 150–400)
RBC: 3.83 MIL/uL — ABNORMAL LOW (ref 3.87–5.11)
RBC: 3.97 MIL/uL (ref 3.87–5.11)
RBC: 4.05 MIL/uL (ref 3.87–5.11)
RDW: 14.6 % (ref 11.5–15.5)
RDW: 14.7 % (ref 11.5–15.5)
RDW: 14.8 % (ref 11.5–15.5)
WBC: 5.9 10*3/uL (ref 4.0–10.5)
WBC: 6.4 10*3/uL (ref 4.0–10.5)
WBC: 6.6 10*3/uL (ref 4.0–10.5)

## 2010-05-11 LAB — GLUCOSE, CAPILLARY
Glucose-Capillary: 107 mg/dL — ABNORMAL HIGH (ref 70–99)
Glucose-Capillary: 120 mg/dL — ABNORMAL HIGH (ref 70–99)
Glucose-Capillary: 120 mg/dL — ABNORMAL HIGH (ref 70–99)
Glucose-Capillary: 127 mg/dL — ABNORMAL HIGH (ref 70–99)
Glucose-Capillary: 131 mg/dL — ABNORMAL HIGH (ref 70–99)
Glucose-Capillary: 133 mg/dL — ABNORMAL HIGH (ref 70–99)
Glucose-Capillary: 142 mg/dL — ABNORMAL HIGH (ref 70–99)
Glucose-Capillary: 148 mg/dL — ABNORMAL HIGH (ref 70–99)
Glucose-Capillary: 156 mg/dL — ABNORMAL HIGH (ref 70–99)
Glucose-Capillary: 156 mg/dL — ABNORMAL HIGH (ref 70–99)
Glucose-Capillary: 163 mg/dL — ABNORMAL HIGH (ref 70–99)
Glucose-Capillary: 164 mg/dL — ABNORMAL HIGH (ref 70–99)
Glucose-Capillary: 170 mg/dL — ABNORMAL HIGH (ref 70–99)
Glucose-Capillary: 175 mg/dL — ABNORMAL HIGH (ref 70–99)
Glucose-Capillary: 181 mg/dL — ABNORMAL HIGH (ref 70–99)
Glucose-Capillary: 209 mg/dL — ABNORMAL HIGH (ref 70–99)

## 2010-05-11 LAB — DIFFERENTIAL
Basophils Absolute: 0 10*3/uL (ref 0.0–0.1)
Basophils Relative: 1 % (ref 0–1)
Eosinophils Absolute: 0.2 10*3/uL (ref 0.0–0.7)
Eosinophils Relative: 4 % (ref 0–5)
Lymphocytes Relative: 33 % (ref 12–46)
Lymphs Abs: 2 10*3/uL (ref 0.7–4.0)
Monocytes Absolute: 0.4 10*3/uL (ref 0.1–1.0)
Monocytes Relative: 8 % (ref 3–12)
Neutro Abs: 3.2 10*3/uL (ref 1.7–7.7)
Neutrophils Relative %: 55 % (ref 43–77)

## 2010-05-11 LAB — LIPID PANEL
Cholesterol: 162 mg/dL (ref 0–200)
HDL: 56 mg/dL (ref >39)
LDL Cholesterol: 90 mg/dL (ref 0–99)
Total CHOL/HDL Ratio: 2.9 RATIO
Triglycerides: 81 mg/dL (ref <150)
VLDL: 16 mg/dL (ref 0–40)

## 2010-05-11 LAB — CARDIAC PANEL(CRET KIN+CKTOT+MB+TROPI)
CK, MB: 1.4 ng/mL (ref 0.3–4.0)
CK, MB: 1.4 ng/mL (ref 0.3–4.0)
CK, MB: 1.5 ng/mL (ref 0.3–4.0)
Relative Index: 1.1 (ref 0.0–2.5)
Relative Index: 1.2 (ref 0.0–2.5)
Relative Index: 1.3 (ref 0.0–2.5)
Total CK: 105 U/L (ref 7–177)
Total CK: 120 U/L (ref 7–177)
Total CK: 134 U/L (ref 7–177)
Troponin I: 0.01 ng/mL (ref 0.00–0.06)
Troponin I: 0.01 ng/mL (ref 0.00–0.06)
Troponin I: 0.02 ng/mL (ref 0.00–0.06)

## 2010-05-11 LAB — COMPREHENSIVE METABOLIC PANEL
ALT: 12 U/L (ref 0–35)
AST: 18 U/L (ref 0–37)
Albumin: 3.8 g/dL (ref 3.5–5.2)
Alkaline Phosphatase: 96 U/L (ref 39–117)
BUN: 12 mg/dL (ref 6–23)
CO2: 27 mEq/L (ref 19–32)
Calcium: 9.7 mg/dL (ref 8.4–10.5)
Chloride: 110 mEq/L (ref 96–112)
Creatinine, Ser: 0.78 mg/dL (ref 0.4–1.2)
GFR calc Af Amer: >60 mL/min (ref >60)
GFR calc non Af Amer: >60 mL/min (ref >60)
Glucose, Bld: 109 mg/dL — ABNORMAL HIGH (ref 70–99)
Potassium: 3.7 mEq/L (ref 3.5–5.1)
Sodium: 143 mEq/L (ref 135–145)
Total Bilirubin: 0.7 mg/dL (ref 0.3–1.2)
Total Protein: 7.6 g/dL (ref 6.0–8.3)

## 2010-05-11 LAB — BASIC METABOLIC PANEL
BUN: 10 mg/dL (ref 6–23)
CO2: 27 mEq/L (ref 19–32)
Calcium: 9.6 mg/dL (ref 8.4–10.5)
Chloride: 112 mEq/L (ref 96–112)
Creatinine, Ser: 0.8 mg/dL (ref 0.4–1.2)
GFR calc Af Amer: >60 mL/min (ref >60)
GFR calc non Af Amer: >60 mL/min (ref >60)
Glucose, Bld: 135 mg/dL — ABNORMAL HIGH (ref 70–99)
Potassium: 3.7 mEq/L (ref 3.5–5.1)
Sodium: 144 mEq/L (ref 135–145)

## 2010-05-11 LAB — CORTISOL: Cortisol, Plasma: 4.4 ug/dL

## 2010-05-11 LAB — TSH: TSH: 9.421 u[IU]/mL — ABNORMAL HIGH (ref 0.350–4.500)

## 2010-05-11 LAB — MAGNESIUM: Magnesium: 1.8 mg/dL (ref 1.5–2.5)

## 2010-05-11 LAB — CORTISOL-AM, BLOOD: Cortisol - AM: 15.6 ug/dL (ref 4.3–22.4)

## 2010-05-11 LAB — BRAIN NATRIURETIC PEPTIDE: Pro B Natriuretic peptide (BNP): <30 pg/mL (ref 0.0–100.0)

## 2010-05-14 ENCOUNTER — Encounter: Payer: Self-pay | Admitting: Internal Medicine

## 2010-05-15 LAB — GLUCOSE, CAPILLARY: Glucose-Capillary: 151 mg/dL — ABNORMAL HIGH (ref 70–99)

## 2010-06-17 NOTE — Cardiovascular Report (Signed)
NAME:  Caitlyn Aguirre, Caitlyn Aguirre NO.:  [id number]   MEDICAL RECORD NO.:  [id number]          PATIENT TYPE:  OIB   LOCATION:  2899                         FACILITY:  [facility]   PHYSICIAN:  Nanetta Batty, M.D.   DATE OF BIRTH:  04-Feb-1935   DATE OF PROCEDURE:  09/21/2008  DATE OF DISCHARGE:                            CARDIAC CATHETERIZATION   Ms. Islam is a 75 year old mildly overweight widowed African American  female, mother of 3 living children who was recently admitted to the  hospital for syncope.  ECG and echo is normal.  Extensive workup was  negative including imaging of her head, normal carotids, and telemetry.  A 2-D echo revealed an EF of 55%.  She was complaining of some chest  pressure the week leading up to her event and she said she had heart  attack in 1998 and a cath at that time, the records are unavailable.  She does get occasional chest pain.  Her risk factors include  hypertension, hyperlipidemia, family history of diabetes.  She had a  Myoview stress test that showed mild anteroapical ischemia.  She  presents now for outpatient diagnostic coronary arteriography to define  her anatomy to rule out ischemic etiology.   PROCEDURE DESCRIPTION:  The patient was brought to the Second Floor,  [facility] in the postabsorptive state.  She was  premedicated with p.o. Valium, IV Versed and fentanyl.  Her right groin  was prepped and shaved in the usual sterile fashion.  Xylocaine 1% was  used for local anesthesia.  A 6-French sheath was inserted into the  right femoral artery using standard Seldinger technique.  A 6-French  right and left Judkins diagnostic catheter as well as a 6-French pigtail  catheter were used for selective cholangiography, left ventriculography  and distal abdominal aortography.  Visipaque dye was used for the  entirety of the case.  Retrograde aortic, ventricular and pullback  pressures were recorded.   HEMODYNAMICS:  1.  Aortic systolic pressure 182, diastolic pressure 85.  2. Left ventricular systolic pressure 184, end-diastolic pressure 15.   SELECTIVE CORONARY CHOLANGIOGRAPHY:  1. Left main normal.  2. LAD normal.  3. Left circumflex normal.  4. Right coronary artery was dominant and normal.   Left Ventriculography; 12; RAO left ventriculogram was performed using  25 mL of Visipaque dye at 12 mL per second.  The overall LVEF was  estimated at approximately 50% with mild global hypokinesia probably  related to hypertensive heart disease.   Distal abdominal aortography; distal abdominal aortogram was performed  using 20 mL of Visipaque dye at 20 mL per second.  The renal arteries  were widely patent.  Infrarenal abdominal aorta and iliac bifurcation  were free of significant and atherosclerotic changes.   IMPRESSION:  Ms. Wrightsman has essentially normal coronary arteries and  normal left ventricular function.  I believe her chest pain is  noncardiac and her Myoview abnormality is false positive.  I am not sure  the etiology of her syncope.  Continued medical therapy will be  recommended including aggressive antihypertensive therapy.  The  patient received 20 mg of labetalol IV for hypertension.  The sheath was  removed and pressure was applied on the groin to achieve hemostasis.  The patient left the lab in stable condition.  She will be discharged  home later today as an outpatient after remaining recumbent for 6 hours  and will see me back in the office in 1-2 weeks in followup.      Nanetta Batty, M.D.  Electronically Signed     JB/MEDQ  D:  09/21/2008  T:  09/21/2008  Job:  [id number]   cc:   Cardiac Catheterization Laboratory Second Floor, [place]  [facility]  Mariea Stable, MD

## 2010-06-17 NOTE — Discharge Summary (Signed)
NAMEMarland Aguirre  Caitlyn, Aguirre NO.:  [id number]   MEDICAL RECORD NO.:  [id number]          PATIENT TYPE:  INP   LOCATION:  3712                         FACILITY:  [facility]   PHYSICIAN:  Madaline Guthrie, M.D.    DATE OF BIRTH:  [birth year]   DATE OF ADMISSION:  08/21/2008  DATE OF DISCHARGE:  08/25/2008                               DISCHARGE SUMMARY   DISCHARGE DIAGNOSES:  1. Syncope.  2. History of congestive heart failure, ejection fraction 55% on      transthoracic echocardiogram.  3. Hypertension.  4. Diabetes mellitus type 2.  5. Hyperlipidemia.  6. Hypothyroidism.  7. Chronic obstructive pulmonary disease.  8. History of ventilation-dependent respiratory failure requiring      tracheostomy in 1999.  9. Depression.  10.Gastroesophageal reflux disease.  11.Osteoarthritis.  12.History of polymyalgia rheumatica.   DISCHARGE MEDICATIONS:  1. Synthroid 100 mcg p.o. daily.  2. Lotensin 10 mg p.o. daily.  3. Glucophage 850 mg p.o. b.i.d.  4. Zantac 150 mg p.o. daily.  5. Ambien 10 mg p.o. at bedtime.  6. Simvastatin 80 mg p.o. daily.  7. Paxil 20 mg p.o. daily.  8. Coreg 12.5 mg p.o. b.i.d.  9. Docusate sodium 100 mg p.o. b.i.d.  10.Senna 17.2 mg p.o. b.i.d.  11.Vicodin 7.5/650 mg 1 tablet q.6 h. p.r.n. for pain.  12.Plavix 75 mg p.o. daily.   PROCEDURE:  1. EKG on August 21, 2008, and August 22, 2008, normal sinus rhythm.  2. Chest x-ray on August 21, 2008, Aguirre chronic bronchitic changes and      mild chronic interstitial lung disease, Aguirre right permanent      pulmonary artery suggesting pulmonary arterial hypertension, Aguirre      borderline cardiomegaly.  No acute abnormality.  3. MRI brain without contrast on August 22, 2008, no acute intracranial      abnormality, mild white matter changes noted within the central      pons likely sequelae of chronic microvascular ischemia.  Remote      lacunar infarct involving the anterior limb of the right internal  capsule versus benign venous anomaly.  4. MRA brain without contrast on August 22, 2008, 3.7 mm superiorly      directed out-pouching at the distal left M1 segment near the      bifurcation concerning for small aneurysm, atherosclerotic      irregularity of the supraclinoid left internal carotid artery      without significant stenosis, signal loss in distal A1 segment      bilaterally concerning for cyanosis, right worse than left.      Segmental signal loss within MCA branch vessels likely reflecting      element of intracranial atherosclerotic change exaggerated by the      patient's emotion.  5. Carotid Duplex on August 22, 2008, no significant extracranial      carotid artery stenosis demonstrated, vertebrals are patent with      antegrade flow.  6. Transthoracic echocardiogram on August 22, 2008, left ventricle with      normal cavity size, mild focal basal and mild concentric  hypertrophy of the septum.  Estimated ejection fraction of 55%.      Doppler parameters consistent with grade 1 diastolic dysfunction.      Atrial septum with aneurysm.  7. EEG on August 23, 2008.  EEG was performed during awake and drowsy      states and was within normal limit.  No definite epileptiform      features noted.   CONSULTATIONS:  Cardiology.   ADMISSION HISTORY AND PHYSICAL:  A 75 year old woman with PMH  significant for hypertension, hyperlipidemia, diabetes mellitus, dilated  cardiomyopathy, and CHF with ejection fraction 45% in April 2009 who  presents with chief complaint of syncope the previous night.  The  patient is accompanied by her daughter who assists in giving the  history.  Unfortunately, the relatives who witnessed the sequelae of  this syncopal event is not present to contribute to the history  directly.  The patient herself does not recall the event in the  following 3 hours.  The patient was visiting her sister and niece in  [place], however, midnight she was sitting in  the living room in  a chair, chatting with her niece who is cooking in the Aguirre nearby.  The niece suddenly noticed that the patient was no longer talking and  found her sitting in the chair with eyes open, but unresponsive.  After  an unknown period of time, the patient was able to speak but was  confused, disoriented, and somnolent.  The niece relayed to the  patient's daughter that the patient's mouth was twisted, and she was  slurring her speech.  When niece brought the patient to the ED in [place].  Her blood sugar was noted to be 141 at the emergency  department.  She did not undergo any imaging studies.  She was  discharged from the ED in [place] around 3 a.m. and asked to  follow up with her PCP.  Her PCP, Caitlyn Aguirre, in the [facility] saw her today and requested that she should be  admitted for workup.  The patient did not recall any participating  factors.  She reports not feeling to good during the preceding day.  She has had a chronic dull pressure headache for months, which was not  particularly worse on the day of the syncopal event.  She has not had  any recent fall.  She has not recently started new medications or stop  taking her regular medication.   PHYSICAL EXAMINATION:  VITAL SIGNS:  Temperature 97.9, pulse rate 71,  blood pressure 154/95, respiratory rate 20, and O2 saturation 94% on  room air.  GENERAL:  No acute distress, pleasant.  HEENT:  Atraumatic.  No skull tenderness.  Oropharynx, pink and moist.  NECK:  No lymphadenopathy.  Neck is supple.  No JVD or carotid bruit.  CHEST:  Bilateral clear to auscultation.  No crackles or wheeze.  CVS:  Regular rate and rhythm.  Diminished cardiac sounds but no  murmurs, gallops, or rubs.  Radial pulses normal and good in volume.  ABDOMEN:  Bowel sounds normal.  Soft, nontender, and nondistended.  EXTREMITIES:  No leg swelling or edema.  NEURO:  Alert and oriented to person,  place, and time.  Strength nearly  symmetrical on both sides, probably mildly decreased on the left side.  Muscle tone within normal limits without any rigidity.  Bilateral deep  tendon reflexes diminished.  Bilateral light touch sensation within  normal limits.  Bilateral pinprick sensation within normal limits.  Finger-to-nose normal bilaterally.  Babinski downgoing bilaterally.   Orthostatic vital signs, lying 147/80, sitting 165/99, standing 139/96.   PERTINENT LABORATORY DATA:  CMET:  Sodium 143, potassium 3.7, chloride  110, bicarb 27, BUN 12, serum creatinine 0.78, glucose 109, calcium 9.7,  and mag 1.8. T. bili 0.7, alk phos 96, AST 18, ALT 12, total protein  7.6, and albumin 3.8.  CBC:  White blood count 6.4, H and H 12.3/36.3, and platelets 192.  BNP less than 30.  Cardiac biomarkers negative x3.  Fasting lipid panel:  Triglycerides 81, HDL 56, and LDL 90.  Hemoglobin A1c 7.7 in clinic.  TSH 9.421 high.  Fasting a.m. cortisol 15.6.   HOSPITAL COURSE:  1. Syncope.  The patient presented with history of acute or subacute      onset of loss of consciousness with subsequent facial asymmetry      with speech and confusion.  These deficits had resolved by the time      of admission and her neurological exam remained Aguirre for the      duration of her stay.  The patient was admitted to telemetry bed      for observation and workup.  Neurological, cardiac, and endocrine      etiologies for syncope were considered.  History was most      concerning for TIA versus postictal state.  MRI and MRA did not      show evidence of any stroke and revealed only mild chronic small      vessel white matter disease insignificant atherosclerotic changes.      A small aneurysm of questionable significance and an intact      vertebrobasilar system.  No episodes of atrial fibrillation or      other arrhythmia were captured by telemetry and TTE showed no left      atrial enlargement.  The TTE did  show a small atrial septal      aneurysm, but EF was in the normal range.  Cardiology was consulted      but unable to perform bubble study for question of patent foramen      ovale during this hospital stay.  Plavix therapy was initiated in      the interim pending that study.  EEG was negative for seizure      focus.  No cardiac events were captured on telemetry.  Cardiac      biomarkers were negative x3 and EKG were negative x2 for ACS and EF      had actually been improved on TTE.  Transient bradycardia, for      example due to sick sinus syndrome remains the possibility.      Transient hypoglycemia also remains a form of possibility, so the      patient's blood glucose within normal range in the ED and she does      not take any oral hypoglycemic agents.  2. Diabetes mellitus type 2.  Hemoglobin A1c 7.7 in the clinic on the      day of admission.  Hemoglobin A1c had previously ranged from 6.1 to      6.7 with the most recent check in March 2010, home metformin was      held and the patient was maintained on sliding scale insulin with      meals at bedtime.  She did not have any episodes of hypoglycemia.  3. Hypertension.  The patient was initially maintained on  home dose of      carvedilol, given recent syncopal episode.  Systolic blood pressure      was elevated to 150-180 range during her hospital stay and there is      no evidence of new CVA, so she was transitioned back to her home      dose on the second day.  4. Hyperlipidemia.  LDL 90.  Consider goal less than 70 given chronic      white matter disease.  LDL was 75 on last checked at November 2009,      continue statin.  5. Hypothyroidism.  TSH markedly elevated at admission to 9.4.  TSH      had previously ranged from 1.4 to 1.5 and was last checked in March      2010.  Synthroid was continued at home dose given the question of      medication compliance.  6. Congestive heart failure resolved per TTE with EF 55%.  The       patient's beta blocker was continued as above, but ACE inhibitor      was held during this admission.  Lasix will not be resumed      postdischarge due to normalized ejection fraction on TTE.   DAY OF DISCHARGE SERVICES:  VITAL SIGNS:  Temperature 98.5, blood  pressure 139/80, pulse rate 63, respiratory rate 19, and O2 saturation  93% on room air.  NEUROLOGIC:  Within normal limits and Aguirre.   LABORATORY DATA:  CBC on August 24, 2008, white blood count 6.6, H and H  12.1 and 35.5, and platelets 161.   DISPOSITION:  The patient was discharged home with self-care.  Her  neurological exam was nonfocal and Aguirre for the duration of her stay.   FOLLOWUP:  The patient will follow up with her PCP, Dr. Mariea Aguirre, at the [facility] on September 04, 2008, at 2  p.m.  TEE or TTE with bubble study will be scheduled as an outpatient  procedure.  If no patent foramen ovale is demonstrated, the patient will  be transitioned from Plavix back to aspirin for prophylaxis.  There is a  question of medication compliance given elevated hemoglobin A1c and TSH  compared to baseline.  Please consider getting daughter involved in the  medication administration.  Recommend continuing on metformin.  Recheck  hemoglobin A1c in 3 months with addition of glipizide if it is still  elevated above 7.5.  Recommend continuing on Synthroid at current dose  and rechecking TSH in 3-4 weeks.      Jason Coop, MD  Electronically Signed      Madaline Guthrie, M.D.  Electronically Signed    YP/MEDQ  D:  08/27/2008  T:  08/28/2008  Job:  [id number]   cc:   Caitlyn Stable, MD

## 2010-06-17 NOTE — Procedures (Signed)
EEG IDENTIFICATION NUMBER:  [id number].   CLINICAL HISTORY:  A 75 year old lady being evaluated for syncopal  episode and confusion.   MEDICATION LISTED:  Colace, Protonix, NovoLog, Synthroid, Zocor,  Glucophage, Paxil, Ambien, Lasix, Nexium and Vicodin.   This is a routine EEG recorded with the patient awake and drowsy using  17-channel machine and standard 10/20 electrode placement.   Background awake rhythm consists of 9-10 Hz alpha which is of moderate  amplitude, synchronous, reactive to eye opening and closure.  No  paroxysmal epileptiform activity, spikes or sharp waves are noted.  Changes of drowsiness alone are noted and no definite sleep stages are  seen.  Length of the recording is 22.4 minutes.  Technical component is  excellent.  EKG tracing reveals regular sinus rhythm with few ectopic  beats.  Hyperventilation and photic stimulation are both unremarkable.   IMPRESSION:  This EEG performed during awake and drowsy states is within  normal limits.  No definite epileptiform features were noted.           ______________________________  Sunny Schlein. Pearlean Brownie, MD     TDD:UKGU  D:  08/23/2008 16:42:36  T:  08/24/2008 05:05:37  Job #:  [id number]   cc:   Jason Coop, MD  Fax: [phone]

## 2010-06-17 NOTE — H&P (Signed)
NAME:  Caitlyn Aguirre, Caitlyn Aguirre NO.:  [id number]   MEDICAL RECORD NO.:  [id number]          PATIENT TYPE:  INP   LOCATION:  5158                         FACILITY:  [facility]   PHYSICIAN:  Salley Scarlet., M.D.DATE OF BIRTH:  1935/08/15   DATE OF ADMISSION:  05/17/2007  DATE OF DISCHARGE:                              HISTORY & PHYSICAL   HISTORY:  This 75 year old lady presented to the emergency room on last  p.m. with a history of having had a fishing cork to come back and hit  her in her right eye while she was fishing.  She sustained a severe  traumatic injury associated with pain and inability to see from the  right eye.  She presented herself to the emergency room where I was  called in to see her.  She was found to have a traumatic hyphema and  markedly elevated intraocular pressure.  She was admitted for treatment  and observation.   PAST HISTORY:  This lady has a history of diabetes, hypertension,  hypothyroidism, and heart disease.  She is on medication for those  conditions.   FAMILY HISTORY:  Noncontributory.   SOCIAL HISTORY:  Noncontributory.   REVIEW OF SYSTEMS:  Otherwise negative.   PHYSICAL EXAMINATION:  GENERAL:  Showed a well-developed, well-nourished  elderly black female in moderate distress.  HEENT:  The head was normocephalic.  The ears, nose, and throat within  normal limits.  The eye examination showed marked edema and ecchymosis  of the upper and lower lid of the right eye.  The eye was 4+ injected.  Gross observation of the cornea showed it to be clear without any  evidence of penetration or laceration.  The anterior chamber however was  shallow and filled with a blood clot and a small layer of blood  inferiorly.  The iris was not seen clearly and there was no adequate  view obtained into the fundus.  The intraocular pressure was recorded as  44.  Neck:  Supple without neck vein distention.  Chest:  The chest measure was symmetrically  equal, respiratory  excursions.  Lungs:  Clear to auscultation and percussion.  Heart:  Rate and rhythm normal.  No extra sounds or  murmurs  appreciated.  Abdomen:  Soft.  Bowel sounds active.  No tenderness or organomegaly.  Extremities:  Within normal limits.  Neurologic:  Negative.   DIAGNOSES:  1. Contusion injury, right eye.  2. Traumatic hyphema, right eye.  3. Secondary glaucoma.  4. Diabetes mellitus.  5. Hypertension.   PLAN:  To admit her for treatment and observation with drops to control  intraocular pressure as well as to have her stay on complete bed rest so  as to hasten the clearance of the blood from the anterior chamber.      Salley Scarlet., M.D.  Electronically Signed     TB/MEDQ  D:  05/18/2007  T:  05/19/2007  Job:  [id number]

## 2010-06-17 NOTE — Op Note (Signed)
NAME:  Caitlyn Aguirre, Caitlyn Aguirre NO.:  [id number]   MEDICAL RECORD NO.:  [id number]          PATIENT TYPE:  INP   LOCATION:  5158                         FACILITY:  [facility]   PHYSICIAN:  Salley Scarlet., M.D.DATE OF BIRTH:  1935-03-11   DATE OF PROCEDURE:  DATE OF DISCHARGE:  05/26/2007                               OPERATIVE REPORT   PREOPERATIVE DIAGNOSIS:  Traumatic hyphema, right eye hyphema.   POSTOPERATIVE DIAGNOSIS:  Traumatic hyphema, right eye hyphema.   OPERATION:  Evacuation of blood clot from anterior chamber, right eye.   ANESTHESIA:  General.   JUSTIFICATION FOR PROCEDURE:  This 75 year old lady sustained a  contusion injury of the right eye 8 days ago when she was hit in the eye  by a fishing cork, which came back to hit her in the face while she was  fishing.  She sustained a severe contusion injury and a traumatic  hyphema.  She was admitted to the hospital for treatment and  observation.  She had markedly elevated intraocular pressures with  pressure of greater than 44.  Her blood pressure was also severely  elevated.  She was treated with bed rest and topical medications.  The  intraocular pressure was eventually controlled as well as her systemic  blood pressure after she was seen in consultation by the Internal  Medicine Department.  Over the course of the next several days, she  showed improvement, but the blood clot was persistent in the anterior  chamber.  For fear that blood staining of the cornea would occur, it was  recommended that we bring her to the operating room and wash the blood  clot out.  She is therefore admitted to have the above procedure.   PROCEDURE:  Under the influence of general inhalation anesthesia, the  patient was prepped and draped in the usual manner.  The lid speculum  was inserted in the upper lower lid of the right eye, and a 4-0 silk  traction suture was passed through the belly of the superior rectus  muscle  retraction.  A small conjunctival flap was turned at the 6-9  o'clock position.  A keratome was used to enter the eye inferiorly at  about the 7:30 o'clock position.  Occucoat was injected into the eye to  deepen the anterior chamber with hopes of avoiding injury to the lens.  The IA handpiece was passed into the eye, and the blood clot was removed  with a considerable amount of difficulty.  At the end of the procedure,  the wound was sutured by using a single suture of 8-0 Vicryl.  The  conjunctiva was closed over wound using thermal cautery.  One mL of  Celestone, 0.5 mL of gentamicin were injected subconjunctivally.  Atropine drops and Maxitrol ointment were applied along with a patch and  Fox shield.  The patient tolerated the procedure well and was returned  to the post anesthesia recovery room in satisfactory condition.  She  will be taken back to her hospital room, but discharged tomorrow if her  condition is satisfactory.  Salley Scarlet., M.D.  Electronically Signed     TB/MEDQ  D:  05/26/2007  T:  05/27/2007  Job:  [id number]

## 2010-06-17 NOTE — Discharge Summary (Signed)
NAME:  Caitlyn Aguirre, Caitlyn Aguirre NO.:  [id number]   MEDICAL RECORD NO.:  [id number]          PATIENT TYPE:  INP   LOCATION:  5158                         FACILITY:  [facility]   PHYSICIAN:  Salley Scarlet., M.D.DATE OF BIRTH:  [birth year]   DATE OF ADMISSION:  05/17/2007  DATE OF DISCHARGE:  05/26/2007                               DISCHARGE SUMMARY   This 75 year old lady presented to the emergency room on May 17, 2007  with a history of having been hit in the right eye with the fishing cork  which came back to hit her in the face while she was fishing.  She was  found to have a severe, traumatic hyphema.  Her intraocular pressure was  also elevated at approximately 44.  She was admitted to the hospital for  treatment and observation.  Her blood pressure became severely elevated.  Consultation was obtained from the internal medicine department who  managed her appropriately.  The blood pressure returned to normal over  the next day or so.  The intraocular pressure gradually came down after  intensive treatment with antiglaucoma medications consisting of Xalatan,  timolol, and Azopt.  She was treated with Pred Forte to counteract the  intraocular information.  At first, she made considerable improvement,  but then she reached a point where no improvement was made, and the  blood clot persisted in the anterior chamber.  After 7 days of treatment  with no resolution of the blood clot, for fear that blood staining of  the cornea would occur, she was advised that we should take her to the  operating room and wash the clot out.  She was therefore taken to  operating room on April 22 at which time the blood clot was washed out  with a moderate amount of difficulty.  She returned to her room where  she was kept overnight.  This morning when she was seen, April 23, the  eye was quiet, the anterior chamber was deep, and there had been no  recurrence of bleeding.  She is therefore  being discharged with  instructions to see me in my office when she leaves the hospital at  which time additional instructions will be given to her.  She is being  discharged with Pred Forte to be used 4 times a day and timolol to be  used every 12 hours.  She has an appointment to see internal medicine  doctors in the medical clinic.   DISCHARGE DIAGNOSES:  1. Contusion injury, right eye.  2. Traumatic erythema, right eye.  3. Secondary glaucoma, right eye.  4. Hypertension.  5. Diabetes mellitus.      Salley Scarlet., M.D.  Electronically Signed     TB/MEDQ  D:  05/26/2007  T:  05/26/2007  Job:  [id number]

## 2010-06-17 NOTE — Consult Note (Signed)
NAME:  Caitlyn Aguirre, Caitlyn Aguirre NO.:  [id number]   MEDICAL RECORD NO.:  [id number]          PATIENT TYPE:  OBV   LOCATION:  3712                         FACILITY:  [facility]   PHYSICIAN:  Antonieta Iba, MD   DATE OF BIRTH:  Jun 04, 1935   DATE OF CONSULTATION:  08/21/2008  DATE OF DISCHARGE:                                 CONSULTATION   HISTORY OF PRESENT ILLNESS:  Ms. Tadlock is a 75 year old female who has a  remote history of an MI.  No details are available.  She is somewhat of  a poor historian and could not remember any one's name.  She thinks it  might have been in 1989.  She has been followed by the teaching service.  She has a history of hypertension and dyslipidemia and diabetes.  She  apparently has not been admitted for MI or recent heart failure.  Recently, she was admitted to an emergency room in [place] after  a syncopal spell.  She was evaluated there and told to follow-up with  her doctors year.  She is admitted August 21, 2008.  She denies any chest  pain or unusual dyspnea.  She does have palpitations from time to time.  An echocardiogram was done and showed an atrial septal aneurysm with an  EF of 55%.  We are asked to see the patient in consult for cardiac  evaluation.   PAST MEDICAL HISTORY:  1. Treated hypertension.  2. Type 2 diabetes.  3. Treated hypothyroidism, although her TSH is 9.4.  4. History of PMR in remission.   CURRENT MEDICATIONS:  1. Synthroid 0.1 mg a day.  2. Aspirin 325 mg a day.  3. Lasix 40 mg a day.  4. Lotensin 10 mg a day.  5. Glucophage 850 mg b.i.d.  6. Nexium 40 mg a day.  7. Ambien 10 mg h.s.  8. Simvastatin 80 mg a day.  9. Paxil 20 mg a day.  10.Colace.  11.Vicodin.  12.Senna.   ALLERGIES:  No known drug allergies, but she is intolerant to codeine.   SOCIAL HISTORY:  She is single, she lives with her daughter and has  grandchildren also in the area.  She is a remote smoker.   FAMILY HISTORY:  Remarkable  for MI and both her mother and her father.   REVIEW OF SYSTEMS:  Essentially unremarkable except for noted above, she  has intermittent tachycardia and palpitations.  She had generalized  fatigue.  She does not remember any specific palpitations or symptoms  prior to blacking out.   PHYSICAL EXAMINATION:  VITAL SIGNS:  Blood pressure 150/80, pulse 72,  respirations 12.  GENERAL:  She is a well-developed obese African American female in no  acute distress.  HEENT:  Normocephalic, atraumatic.  Extraocular movements intact.  Sclerae is nonicteric.  NECK:  Without JVD or bruit.  CHEST:  Clear to auscultation and percussion.  CARDIAC:  Reveals regular rate and rhythm without obvious murmur, rub or  gallop.  Normal S1, S2.  ABDOMEN:  Not distended.  She is obese, nontender.  EXTREMITIES:  Without  edema.  Distal pulses are intact.  NEURO:  Exam grossly intact.  She is awake, alert and oriented and  cooperative.  Moves all extremities without obvious deficit.  SKIN:  Cool and dry.   LABORATORY DATA:  Troponins are negative x3.  TSH is 9.42.  Sodium 144,  potassium 3.7, BUN 10, creatinine 0.8.  White count 5.9, hemoglobin  11.3, hematocrit 33.8, platelets 158.  Echo shows an EF of 55% with an  atrial septal aneurysm.  EKG is sinus rhythm without acute changes.   IMPRESSION:  1. Syncope.  2. Atrial septal aneurysm on echo with good LV function, question      significance.  3. History of remote coronary disease, details unavailable.  4. Treated dyslipidemia.  5. Treated hypothyroidism, although her TSH is slightly elevated at      9.4.  6. Type 2 diabetes.  7. Treated hypertension.   PLAN:  Will try and get more records on her coronary disease.  Her echo  will be reviewed by the cardiologist today and further recommendations  to follow.      Abelino Derrick, P.A.      Antonieta Iba, MD  Electronically Signed    LKK/MEDQ  D:  08/23/2008  T:  08/23/2008  Job:  [id number]

## 2010-06-20 NOTE — Op Note (Signed)
NAME:  Caitlyn Aguirre, Caitlyn Aguirre NO.:  [id number]   MEDICAL RECORD NO.:  [id number]          PATIENT TYPE:  AMB   LOCATION:                               FACILITY:  [facility]   PHYSICIAN:  Salley Scarlet., M.D.DATE OF BIRTH:  Nov 10, 1935   DATE OF PROCEDURE:  DATE OF DISCHARGE:                               OPERATIVE REPORT   PREOPERATIVE DIAGNOSIS:  Immature traumatic cataract, right eye.   POSTOPERATIVE DIAGNOSIS:  Immature traumatic cataract, right eye.   OPERATION:  Kelman phacoemulsification cataract, right eye with  intraocular lens implantation.   ANESTHESIA:  General.   JUSTIFICATION OF PROCEDURE:  This is a 75 year old lady who sustained  trauma to the right eye in April of this year.  She sustained the injury  when a fish cork, flew back, and hit her in her right eye.  She  sustained a traumatic hyphema for which she was hospitalized.  Surgery  was necessary to wash out the hyphema because of sustained elevated  intraocular pressure, and because the hyphema failed to absorb.  Postoperatively, she has done well as related to the injury.  However,  she has had increasing complaints of blurring of vision of the right eye  as a traumatic cataract had developed.  On a recent visit, she was  evaluated and found to have a visual acuity of best corrected at 20/70.  There was a posterior subcapsular cataract present along with cortical  and nuclear changes.  Cataract extraction was recommended.  It was also  explained to the patient that, because of her injuries, surgery would be  more difficult than if she had not had the injury.  The risks and  benefits were carefully explained to the patient.  She has opted to have  the cataract removed.  Therefore, she is admitted at this time for that  purpose.   PROCEDURE:  Under the influence of general inhalation anesthesia, the  patient was prepped and draped in the usual manner.  The lid speculum  was inserted under the  upper and lower lid of the right eye and a 4-0  silk traction suture was passed through the belly of the superior rectus  muscle for traction.  A fornix-based conjunctival flap was turned and  hemostasis was achieved by using cautery.  An incision was made in the  sclera at the limbus.  This incision was dissected down into clear  cornea using a crescent blade.  A sideport incision was made at 1:30  o'clock position.  OcuCoat was injected into the eye through the  sideport incision.  The anterior chamber was entered through the  corneoscleral tunnel incision at 11:30 o'clock position.  An anterior  capsulotomy was then done using a bent 25-gauge needle.  The nucleus was  hydrodissected using Xylocaine.  The KPE handpiece was passed into the  eye and the nucleus was emulsified without difficulty.  The residual  cortical material was aspirated.  The posterior capsule was polished  using olive tip polisher.  The wound was widened slightly to accommodate  a foldable silicone lens.  The lens  was seated into the eye behind the  iris without difficulty.  The anterior chamber was reformed and the  pupils constricted using Miochol.  The lips of the wound were hydrated  and tested to make sure that there was no leak.  After ascertaining that  there was no leak, the conjunctiva was closed over the wound using  thermal cautery.  A 1 mL of Celestone, 0.5 mL of gentamicin were  injected subconjunctivally.  Pilocaine ophthalmic ointment and Maxitrol  ophthalmic ointment were applied along with a patch and Fox shield.  The  patient tolerated the procedure  well and was discharged to the postanesthesia recovery in satisfactory  condition.  She is instructed to rest today, to take Vicodin every 4  hours as needed for pain, and to see me in the office tomorrow for  further evaluation.   DISCHARGE DIAGNOSIS:  Immature traumatic cataract, right eye.      Salley Scarlet., M.D.  Electronically  Signed     TB/MEDQ  D:  11/13/2007  T:  11/14/2007  Job:  [id number]

## 2010-06-20 NOTE — Discharge Summary (Signed)
[place]  Patient:    Caitlyn Aguirre, Caitlyn Aguirre                         MRN: [id number] Adm. Date:  [id number] Disc. Date: [id number] Attending:  Phifer, Harriett Sine Welcome Dictator:   Maryelizabeth Rowan, M.D. CC:         Arsenio Loader, M.D., Outpatient Clinic   Discharge Summary  PRIMARY DISCHARGE DIAGNOSIS:  Pneumonia.  SECONDARY DIAGNOSES: 1. History of congestive heart failure. 2. Hypertension. 3. Hypothyroidism. 4. Diabetes, type 2, diet controlled. 5. Depression. 6. History of tuberculosis. 7. Dyslipidemia. 8. History of tobacco abuse.  HOSPITAL COURSE:  This 75 year old African-American female was admitted with chief complaint of chest pain.  The patient presented very clear story of infectious etiology with copious productive cough and sputum as well as some fever and chills.  The patient presents with chest pain with cough and deep inspiratory breathing.  The patient was admitted, ruled out for an MI, and treated with Tequin IV as well as pain medicines.  The patient had significant relief of her chest pain after this treatment.  DISCHARGE CONDITION:  The patient was discharged home in stable condition.  DISCHARGE MEDICATIONS: 1. Synthroid 125 q.d. 2. Lotensin 10 q.d. 3. Lasix 40 p.o. q.d. 4. Prilosec. 5. Aspirin 325 q.d. 6. K-Dur 10 mEq p.o. q.d. 7. Tequin 400 mg p.o. q.d. x 7 days.  ACTIVITY:  Not restricted.  DIET:  Diabetic diet.  FOLLOW-UP:  Her primary physician on April 1 at 10:25 a.m.  OUTPATIENT RECOMMENDATIONS:  To consider treatment for hyperlipidemia as patient has elevated total cholesterol. DD:  03/24/00 TD:  03/25/00 Job: [id number]

## 2010-06-20 NOTE — Discharge Summary (Signed)
[place]  Patient:    Caitlyn Aguirre, Caitlyn Aguirre                         MRN: [id number] Adm. Date:  [id number] Disc. Date: [id number] Attending:  Edwyna Perfect Dictator:   Ebbie Ridge, M.D. CC:         Madaline Savage, M.D., cardiology  Arsenio Loader, M.D., internal med. outpatient clinic   Discharge Summary  DISCHARGE DIAGNOSES: 1. Chest pain, rule out acute myocardial infarction. 2. Hypertension. 3. Congestive heart failure, ejection fraction 35 to 45% with normal    catheterization in 1999 by Madaline Savage, M.D. 4. Dilated cardiomyopathy. 5. Hypothyroidism. 6. Type 2 diabetes mellitus, diet controlled. 7. Hyperlipidemia, LDL equals 155, HDL 58 (January of 2001). 8. History of tobacco and alcohol use, quit 25 years ago.  DISCHARGE MEDICATIONS:  1. Aspirin 325 mg p.o. q.d.  2. Synthroid 0.125 mg p.o. q.d.  3. Lotensin 10 mg p.o. q.d.  4. Lasix 40 mg p.o. q.d.  5. K-Dur 10 mg p.o. q.d.  6. Prilosec 20 mg p.o. q.d.  7. Ambien 10 mg p.o. q.h.s. p.r.n. sleep.  8. Zocor 20 mg p.o. q.h.s.  9. Toprol XL 50 mg p.o. q.d. 10. Nitroglycerin 0.4 mg sublingual q. 5 minutes x 3 p.r.n.  HISTORY OF PRESENT ILLNESS:  Briefly, Caitlyn Aguirre is a 75 year old African-American female with history of congestive heart failure/dilated cardiomyopathy with EF 35% normal cath in 1999, diabetes mellitus, and hypertension, who presents with substernal left sharp chest pain which progressively worsened to a pressure sensation.  This pain was associated with nausea, shortness of breath, and radiating pain to the left neck.  The onset of pain occurred immediately after she found out that her grandson had been killed this morning. She denies fever and cough.  She denies dyspnea on exertion.  No recent leg edema.  No orthopnea or paroxysmal nocturnal dyspnea. No recent chest pain prior to today.  Her cardiac risk factors include age, hyperlipidemia, diabetes mellitus,  hypertension, remote tobacco use, and a family history of sister who had MI in her early 39s.  EKG in the emergency room revealed normal sinus rhythm with no significant changes since a February 2002 EKG.  Portable chest x-ray showed bibasilar atelectasis.  LABORATORY DATA:  Hemoglobin 13.3 with a white count of 13.5.  PHYSICAL EXAMINATION:  VITAL SIGNS:  Caitlyn Aguirre was afebrile, tearful, and in no acute distress.  LUNGS:  She had bibasilar crackles half way up the lungs.  CARDIOVASCULAR:  Her heart rate was tachycardic with normal rhythm and no murmur.  EXTREMITIES:  She had no lower extremity or pedal edema.  Given the patients high probability of coronary artery disease with her cardiac risk factors, the patient was admitted for rule out MI.  HOSPITAL COURSE:  Caitlyn Aguirre remained pain-free overnight.  Her cardiac enzymes were negative x 3.  Repeat EKG revealed no change.  She requested to be discharged so that she could attend a funeral arrangement and be with her family to mourn the recent death of her grandson.  Given that she had 12 hours of constant pain, no ischemic EKG changes, and negative cardiac enzymes, it was suspected that the patients symptoms were secondary to emotional trauma and not coronary artery disease.  This theory was supported by normal coronary arteries on cath in 1999.  For these reasons, the patient was discharged and she will follow up for an outpatient  Cardiolite to be scheduled.  A beta-blocker, antilipid agent, and nitroglycerin p.r.n. were added for better management for her congestive heart failure and cardiac disease.  FOLLOW-UP:  The patient will be called on Monday with appointments for an outpatient Cardiolite study and follow-up appointment with her regular doctor, Arsenio Loader, M.D. DD:  05/29/00 TD:  05/31/00 Job: [id number]

## 2010-06-20 NOTE — Op Note (Signed)
NAME:  Caitlyn Aguirre, Caitlyn Aguirre NO.:  [id number]   MEDICAL RECORD NO.:  [id number]          PATIENT TYPE:  AMB   LOCATION:  SDS                          FACILITY:  [facility]   PHYSICIAN:  Salley Scarlet., M.D.DATE OF BIRTH:  [birth year]   DATE OF PROCEDURE:  11/13/2007  DATE OF DISCHARGE:  10/11/2007                               OPERATIVE REPORT   PREOPERATIVE DIAGNOSIS:  Immature cataract, right eye.   POSTOPERATIVE DIAGNOSIS:  Immature cataract, right eye.   OPERATION:  Extracapsular cataract extraction with intraocular lens  implantation.   ANESTHESIA:  General.   JUSTIFICATION FOR PROCEDURE:  This is a 75 year old lady who underwent a  cataract extraction from the left eye several weeks ago with good visual  result.  She has a cataract of the right eye which prevents her from  seeing as well as asking to read, as well as she would like to do  cataract extraction with intraocular lens implantation was recommended.  She is admitted at this time for that purpose.   PROCEDURE:  Under influence of IV sedation,   Dictation Ended At This Point.      Salley Scarlet., M.D.     TB/MEDQ  D:  11/13/2007  T:  11/14/2007  Job:  [id number]

## 2010-06-24 ENCOUNTER — Encounter: Payer: Self-pay | Admitting: Internal Medicine

## 2010-07-04 ENCOUNTER — Other Ambulatory Visit: Payer: Self-pay | Admitting: *Deleted

## 2010-07-07 MED ORDER — ZOLPIDEM TARTRATE 10 MG PO TABS: ORAL_TABLET | ORAL | Status: DC

## 2010-07-10 ENCOUNTER — Ambulatory Visit (INDEPENDENT_AMBULATORY_CARE_PROVIDER_SITE_OTHER): Payer: Medicare Other

## 2010-07-10 ENCOUNTER — Inpatient Hospital Stay (INDEPENDENT_AMBULATORY_CARE_PROVIDER_SITE_OTHER)
Admission: RE | Admit: 2010-07-10 | Discharge: 2010-07-10 | Disposition: A | Discharge status: Home / Self Care | Payer: Medicare Other | Source: Ambulatory Visit | Attending: Emergency Medicine | Admitting: Emergency Medicine

## 2010-07-10 DIAGNOSIS — S93409A Sprain of unspecified ligament of unspecified ankle, initial encounter: Secondary | ICD-10-CM

## 2010-07-10 DIAGNOSIS — M79609 Pain in unspecified limb: Secondary | ICD-10-CM

## 2010-07-10 DIAGNOSIS — W19XXXA Unspecified fall, initial encounter: Secondary | ICD-10-CM

## 2010-07-14 NOTE — Telephone Encounter (Signed)
[facility]

## 2010-07-16 ENCOUNTER — Encounter: Payer: Self-pay | Admitting: Internal Medicine

## 2010-07-16 ENCOUNTER — Ambulatory Visit (INDEPENDENT_AMBULATORY_CARE_PROVIDER_SITE_OTHER): Payer: Medicare Other | Admitting: Internal Medicine

## 2010-07-16 DIAGNOSIS — E785 Hyperlipidemia, unspecified: Secondary | ICD-10-CM

## 2010-07-16 DIAGNOSIS — I10 Essential (primary) hypertension: Secondary | ICD-10-CM

## 2010-07-16 DIAGNOSIS — E119 Type 2 diabetes mellitus without complications: Secondary | ICD-10-CM

## 2010-07-16 DIAGNOSIS — M25519 Pain in unspecified shoulder: Secondary | ICD-10-CM

## 2010-07-16 DIAGNOSIS — E039 Hypothyroidism, unspecified: Secondary | ICD-10-CM

## 2010-07-16 DIAGNOSIS — M1990 Unspecified osteoarthritis, unspecified site: Secondary | ICD-10-CM

## 2010-07-16 LAB — GLUCOSE, CAPILLARY: Glucose-Capillary: 170 mg/dL — ABNORMAL HIGH (ref 70–99)

## 2010-07-16 LAB — POCT GLYCOSYLATED HEMOGLOBIN (HGB A1C): Hemoglobin A1C: 7.9

## 2010-07-16 MED ORDER — GLIPIZIDE ER 5 MG PO TB24: 5.0000 mg | ORAL_TABLET | Freq: Every day | ORAL | Status: DC

## 2010-07-16 MED ORDER — ACCU-CHEK AVIVA PLUS W/DEVICE KIT: 1.0000 | PACK | Freq: Two times a day (BID) | Status: DC

## 2010-07-16 MED ORDER — ACCU-CHEK FASTCLIX LANCETS MISC: 1.0000 | Freq: Two times a day (BID) | Status: DC

## 2010-07-16 MED ORDER — HYDROCODONE-ACETAMINOPHEN 5-500 MG PO TABS: 1.0000 | ORAL_TABLET | Freq: Two times a day (BID) | ORAL | Status: DC | PRN

## 2010-07-16 MED ORDER — GLUCOSE BLOOD VI STRP: ORAL_STRIP | Status: DC

## 2010-07-16 NOTE — Assessment & Plan Note (Signed)
LDL right around 100. Continue simvastatin at 20mg  given concurrent use of amlodipine. Can consider changing to lipitor but cannot increase simvastatin.

## 2010-07-16 NOTE — Progress Notes (Signed)
Addended by: Zenovia Jordan on: 07/16/2010 02:57 PM   Modules accepted: Orders

## 2010-07-16 NOTE — Assessment & Plan Note (Signed)
A1c slightly elevated. Patient reports lowest CBGs in 150 range. Taking glucotrol xl 2.5mg , will increase to 5mg  po daily

## 2010-07-16 NOTE — Assessment & Plan Note (Signed)
BP continues to fluctuate some. Slightly elevated today but patient had a fall and has knee pain associated with it. WNL last visit. Given fluctuation and current pain, will continue with current regimen, especially with patients age.

## 2010-07-16 NOTE — Progress Notes (Signed)
Addended by: Zenovia Jordan on: 07/16/2010 09:21 AM   Modules accepted: Orders

## 2010-07-16 NOTE — Patient Instructions (Signed)
Follow up in 2-4 months. Stop tramadol and start vicodin for pain. Increase glucotrol to 5mg  daily (can take 2 tablets of the 2.5 until you run out, then you will get the 5 mg tablet and you will only take 1 of those). If you have any other problems before your next appointment, call clinic. Make sure to check your sugar if you have any symptoms of low blood sugar.

## 2010-07-16 NOTE — Assessment & Plan Note (Signed)
As per my last note in 03/2010....... Tramadol not working and changed to vicodin. However, pt never received medication from pharmacy and has continued tramadol. Will call pharmacy and d/c tramadol and try vicodin, if this works, will have patient sign a pain contract and continue.

## 2010-07-16 NOTE — Progress Notes (Signed)
  Subjective:    Patient ID: Caitlyn Aguirre, female    DOB: January 05, 1936, 75 y.o.   MRN: [id number]  HPI Here for routine follow up without complaints. Lipids and TSH checked last visit. Not taking vicodin as the pharmacy continues to supply tramadol instead.  Again, reports tramadol is not helping.  Pt did go to PT 1 time for shoulder pain but couldn't continue due to cost.  However, reports shoulder is better.  About 5 days ago, was going down steps and knee gave out leading to a fall.  Has right ankle pain that is getting better.   Review of Systems    Constitutional: Negative for fever, activity change, fatigue and unexpected weight change.  HENT: Negative for ear pain and neck stiffness.  Eyes: Negative for visual disturbance.  Respiratory: Negative for chest tightness and shortness of breath.  Cardiovascular: Negative for chest pain and palpitations.  Genitourinary: Negative for dysuria.  Musculoskeletal: Positive for arthralgias.  Neurological: Negative for weakness.  Objective:   Physical Exam    Constitutional: She is oriented to person, place, and time. She appears well-developed and well-nourished. No distress.  Eyes: Pupils are equal, round, and reactive to light. Right eye exhibits no discharge. Left eye exhibits no discharge. No scleral icterus.  Neck: Neck supple. No JVD present.  Cardiovascular: Normal rate, regular rhythm, normal heart sounds and intact distal pulses. Exam reveals no gallop and no friction rub.  No murmur heard.  Pulmonary/Chest: Effort normal and breath sounds normal. No respiratory distress. She has no wheezes. She has no rales.  Abdominal: Soft. Bowel sounds are normal. There is no tenderness.  Musculoskeletal: She exhibits no edema and no tenderness.  Neurological: She is alert and oriented to person, place, and time. No cranial nerve deficit.  Skin: She is not diaphoretic.  Psychiatric: She has a normal mood and affect.     Assessment & Plan:

## 2010-07-16 NOTE — Assessment & Plan Note (Signed)
TSH checked last visit and at target

## 2010-08-16 ENCOUNTER — Encounter: Payer: Self-pay | Admitting: Internal Medicine

## 2010-09-05 ENCOUNTER — Other Ambulatory Visit: Payer: Self-pay | Admitting: *Deleted

## 2010-09-05 ENCOUNTER — Other Ambulatory Visit: Payer: Self-pay | Admitting: Internal Medicine

## 2010-10-03 ENCOUNTER — Other Ambulatory Visit: Payer: Self-pay | Admitting: *Deleted

## 2010-10-03 DIAGNOSIS — M1990 Unspecified osteoarthritis, unspecified site: Secondary | ICD-10-CM

## 2010-10-03 DIAGNOSIS — M25519 Pain in unspecified shoulder: Secondary | ICD-10-CM

## 2010-10-06 ENCOUNTER — Inpatient Hospital Stay (INDEPENDENT_AMBULATORY_CARE_PROVIDER_SITE_OTHER)
Admission: RE | Admit: 2010-10-06 | Discharge: 2010-10-06 | Disposition: A | Discharge status: Home / Self Care | Payer: 59 | Source: Ambulatory Visit | Attending: Family Medicine | Admitting: Family Medicine

## 2010-10-06 DIAGNOSIS — N390 Urinary tract infection, site not specified: Secondary | ICD-10-CM

## 2010-10-06 LAB — POCT URINALYSIS DIP (DEVICE)
Bilirubin Urine: NEGATIVE
Glucose, UA: NEGATIVE mg/dL
Ketones, ur: NEGATIVE mg/dL
Nitrite: POSITIVE — AB
Protein, ur: 30 mg/dL — AB
Specific Gravity, Urine: 1.015 (ref 1.005–1.030)
Urobilinogen, UA: 1 mg/dL (ref 0.0–1.0)
pH: 5.5 (ref 5.0–8.0)

## 2010-10-06 LAB — POCT I-STAT, CHEM 8
BUN: 8 mg/dL (ref 6–23)
Calcium, Ion: 1.26 mmol/L (ref 1.12–1.32)
Chloride: 107 mEq/L (ref 96–112)
Creatinine, Ser: 0.8 mg/dL (ref 0.50–1.10)
Glucose, Bld: 153 mg/dL — ABNORMAL HIGH (ref 70–99)
HCT: 39 % (ref 36.0–46.0)
Hemoglobin: 13.3 g/dL (ref 12.0–15.0)
Potassium: 3.8 mEq/L (ref 3.5–5.1)
Sodium: 141 mEq/L (ref 135–145)
TCO2: 23 mmol/L (ref 0–100)

## 2010-10-07 MED ORDER — ZOLPIDEM TARTRATE 10 MG PO TABS: ORAL_TABLET | ORAL | Status: DC

## 2010-10-07 MED ORDER — HYDROCODONE-ACETAMINOPHEN 5-500 MG PO TABS: 1.0000 | ORAL_TABLET | Freq: Two times a day (BID) | ORAL | Status: DC | PRN

## 2010-10-07 NOTE — Telephone Encounter (Signed)
By reviewing Dr. Augusto Gamble note from June 2012, patient was supposed to get a pain contract filled. I will refill this time his Vicodin and get the pain contract started the next visit.  Daleen Bo

## 2010-10-08 LAB — URINE CULTURE
Colony Count: >=100000
Culture  Setup Time: 201209031445

## 2010-10-08 NOTE — Telephone Encounter (Signed)
Called to pharm 

## 2010-10-28 LAB — BASIC METABOLIC PANEL
BUN: 12
BUN: 14
BUN: 20
BUN: 5 — ABNORMAL LOW
CO2: 23
CO2: 24
CO2: 26
CO2: 27
Calcium: 10.1
Calcium: 10.1
Calcium: 10.3
Calcium: 9.6
Chloride: 102
Chloride: 102
Chloride: 104
Chloride: 110
Creatinine, Ser: 0.68
Creatinine, Ser: 0.86
Creatinine, Ser: 1.06
Creatinine, Ser: 1.09
GFR calc Af Amer: >60
GFR calc Af Amer: >60
GFR calc Af Amer: >60
GFR calc Af Amer: >60
GFR calc non Af Amer: 50 — ABNORMAL LOW
GFR calc non Af Amer: 51 — ABNORMAL LOW
GFR calc non Af Amer: >60
GFR calc non Af Amer: >60
Glucose, Bld: 108 — ABNORMAL HIGH
Glucose, Bld: 112 — ABNORMAL HIGH
Glucose, Bld: 114 — ABNORMAL HIGH
Glucose, Bld: 201 — ABNORMAL HIGH
Potassium: 3.4 — ABNORMAL LOW
Potassium: 3.8
Potassium: 4
Potassium: 4.2
Sodium: 134 — ABNORMAL LOW
Sodium: 137
Sodium: 137
Sodium: 142

## 2010-10-28 LAB — CBC
HCT: 35.2 — ABNORMAL LOW
HCT: 37.6
Hemoglobin: 11.8 — ABNORMAL LOW
Hemoglobin: 12.9
MCHC: 33.5
MCHC: 34.3
MCV: 88.6
MCV: 89
Platelets: 202
Platelets: 237
RBC: 3.97
RBC: 4.23
RDW: 14.1
RDW: 14.5
WBC: 7.6
WBC: 9.5

## 2010-10-28 LAB — RETICULOCYTES
RBC.: 4.22
Retic Count, Absolute: 71.7
Retic Ct Pct: 1.7

## 2010-10-28 LAB — URINALYSIS, ROUTINE W REFLEX MICROSCOPIC
Bilirubin Urine: NEGATIVE
Glucose, UA: NEGATIVE
Hgb urine dipstick: NEGATIVE
Ketones, ur: NEGATIVE
Leukocytes, UA: NEGATIVE
Nitrite: NEGATIVE
Protein, ur: NEGATIVE
Specific Gravity, Urine: <1.005 — ABNORMAL LOW
Urobilinogen, UA: 0.2
pH: 8.5 — ABNORMAL HIGH

## 2010-10-28 LAB — VITAMIN B12: Vitamin B-12: 376 (ref 211–911)

## 2010-10-28 LAB — URINE MICROSCOPIC-ADD ON

## 2010-10-28 LAB — LIPID PANEL
Cholesterol: 179
HDL: 73
LDL Cholesterol: 95
Total CHOL/HDL Ratio: 2.5
Triglycerides: 53
VLDL: 11

## 2010-10-28 LAB — HEMOGLOBIN A1C
Hgb A1c MFr Bld: 6.5 — ABNORMAL HIGH
Mean Plasma Glucose: 154

## 2010-10-28 LAB — IRON AND TIBC
Iron: 55
Saturation Ratios: 17 — ABNORMAL LOW
TIBC: 327
UIBC: 272

## 2010-10-28 LAB — MAGNESIUM: Magnesium: 1.9

## 2010-10-28 LAB — FERRITIN: Ferritin: 15 (ref 10–291)

## 2010-10-28 LAB — FOLATE: Folate: >20

## 2010-10-28 LAB — FOLATE RBC: RBC Folate: 723 — ABNORMAL HIGH

## 2010-10-28 LAB — TSH: TSH: 0.814

## 2010-11-05 LAB — GLUCOSE, CAPILLARY
Glucose-Capillary: 90
Glucose-Capillary: 119 — ABNORMAL HIGH
Glucose-Capillary: 133 — ABNORMAL HIGH
Glucose-Capillary: 133 — ABNORMAL HIGH

## 2010-11-05 LAB — CBC
HCT: 34.7 — ABNORMAL LOW
Hemoglobin: 11.3 — ABNORMAL LOW
MCHC: 32.5
MCV: 88.3
Platelets: 233
RBC: 3.94
RDW: 15.3
WBC: 7.6

## 2010-11-05 LAB — URINALYSIS, ROUTINE W REFLEX MICROSCOPIC
Bilirubin Urine: NEGATIVE
Glucose, UA: NEGATIVE
Hgb urine dipstick: NEGATIVE
Ketones, ur: NEGATIVE
Nitrite: NEGATIVE
Protein, ur: NEGATIVE
Specific Gravity, Urine: 1.024
Urobilinogen, UA: 1
pH: 5.5

## 2010-11-05 LAB — BASIC METABOLIC PANEL
BUN: 8
CO2: 28
Calcium: 9.5
Chloride: 110
Creatinine, Ser: 0.76
GFR calc Af Amer: >60
GFR calc non Af Amer: >60
Glucose, Bld: 115 — ABNORMAL HIGH
Potassium: 3.5
Sodium: 143

## 2010-11-08 ENCOUNTER — Inpatient Hospital Stay (INDEPENDENT_AMBULATORY_CARE_PROVIDER_SITE_OTHER)
Admission: RE | Admit: 2010-11-08 | Discharge: 2010-11-08 | Disposition: A | Discharge status: Home / Self Care | Payer: 59 | Source: Ambulatory Visit | Attending: Family Medicine | Admitting: Family Medicine

## 2010-11-08 DIAGNOSIS — N390 Urinary tract infection, site not specified: Secondary | ICD-10-CM

## 2010-11-08 LAB — POCT URINALYSIS DIP (DEVICE)
Bilirubin Urine: NEGATIVE
Glucose, UA: NEGATIVE mg/dL
Ketones, ur: NEGATIVE mg/dL
Nitrite: POSITIVE — AB
Protein, ur: >=300 mg/dL — AB
Specific Gravity, Urine: 1.02 (ref 1.005–1.030)
Urobilinogen, UA: 0.2 mg/dL (ref 0.0–1.0)
pH: 6 (ref 5.0–8.0)

## 2010-11-11 LAB — URINE CULTURE
Colony Count: >=100000
Culture  Setup Time: 201210061820

## 2010-11-20 ENCOUNTER — Ambulatory Visit (INDEPENDENT_AMBULATORY_CARE_PROVIDER_SITE_OTHER): Payer: 59 | Admitting: Internal Medicine

## 2010-11-20 ENCOUNTER — Encounter: Payer: Self-pay | Admitting: Internal Medicine

## 2010-11-20 DIAGNOSIS — N390 Urinary tract infection, site not specified: Secondary | ICD-10-CM

## 2010-11-20 DIAGNOSIS — M1990 Unspecified osteoarthritis, unspecified site: Secondary | ICD-10-CM

## 2010-11-20 DIAGNOSIS — E785 Hyperlipidemia, unspecified: Secondary | ICD-10-CM

## 2010-11-20 DIAGNOSIS — D649 Anemia, unspecified: Secondary | ICD-10-CM

## 2010-11-20 DIAGNOSIS — Z23 Encounter for immunization: Secondary | ICD-10-CM

## 2010-11-20 DIAGNOSIS — I10 Essential (primary) hypertension: Secondary | ICD-10-CM

## 2010-11-20 DIAGNOSIS — E119 Type 2 diabetes mellitus without complications: Secondary | ICD-10-CM

## 2010-11-20 LAB — CBC
HCT: 35.9 % — ABNORMAL LOW (ref 36.0–46.0)
Hemoglobin: 11.8 g/dL — ABNORMAL LOW (ref 12.0–15.0)
MCH: 28.9 pg (ref 26.0–34.0)
MCHC: 32.9 g/dL (ref 30.0–36.0)
MCV: 87.8 fL (ref 78.0–100.0)
Platelets: 305 10*3/uL (ref 150–400)
RBC: 4.09 MIL/uL (ref 3.87–5.11)
RDW: 14.7 % (ref 11.5–15.5)
WBC: 6.6 10*3/uL (ref 4.0–10.5)

## 2010-11-20 LAB — POCT GLYCOSYLATED HEMOGLOBIN (HGB A1C): Hemoglobin A1C: 7.4

## 2010-11-20 LAB — GLUCOSE, CAPILLARY: Glucose-Capillary: 210 mg/dL — ABNORMAL HIGH (ref 70–99)

## 2010-11-20 MED ORDER — POTASSIUM CHLORIDE 20 MEQ PO PACK: 20.0000 meq | PACK | Freq: Every day | ORAL | Status: DC

## 2010-11-20 MED ORDER — LISINOPRIL 5 MG PO TABS: 5.0000 mg | ORAL_TABLET | Freq: Every day | ORAL | Status: DC

## 2010-11-20 NOTE — Assessment & Plan Note (Addendum)
Patient had 2 episode of UTI in the last one month . She was recently given  cefaloxin for 7 days and she completed the course. Urine culture showed E.coli sensitive to cephalosporins. The Urine dip in our clinic was negative. Will continue to monitor. If she continues to have recurrent infection consider to refer to Urology. Furthermore continue to monitor for possible incontinence which can contribute to recurrent UTI.

## 2010-11-20 NOTE — Assessment & Plan Note (Addendum)
Diabetes well controlled . I informed her that she should take metformin twice a day. Will check urine micro/albumin ratio today.  Referral to ophathlamology at the next office visit since patient is not sure if the insurance will cover the visit. Informed the patient to contact Rudell Cobb for further information.

## 2010-11-20 NOTE — Assessment & Plan Note (Addendum)
Blood pressure well controlled

## 2010-11-20 NOTE — Assessment & Plan Note (Signed)
Continue current regimen Will check lft

## 2010-11-20 NOTE — Assessment & Plan Note (Signed)
Chronic, consider xray of knee and refer to sportsmedicine for further evalution. Patient has episode of fall due to giving out
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
## 2010-11-21 LAB — HEPATIC FUNCTION PANEL
ALT: 8 U/L (ref 0–35)
AST: 13 U/L (ref 0–37)
Albumin: 4.2 g/dL (ref 3.5–5.2)
Alkaline Phosphatase: 84 U/L (ref 39–117)
Bilirubin, Direct: 0.1 mg/dL (ref 0.0–0.3)
Indirect Bilirubin: 0.4 mg/dL (ref 0.0–0.9)
Total Bilirubin: 0.5 mg/dL (ref 0.3–1.2)
Total Protein: 7.3 g/dL (ref 6.0–8.3)

## 2010-11-21 LAB — MICROALBUMIN / CREATININE URINE RATIO
Creatinine, Urine: 86.9 mg/dL
Microalb Creat Ratio: 10.5 mg/g (ref 0.0–30.0)
Microalb, Ur: 0.91 mg/dL (ref 0.00–1.89)

## 2010-11-26 ENCOUNTER — Ambulatory Visit (INDEPENDENT_AMBULATORY_CARE_PROVIDER_SITE_OTHER): Payer: 59 | Admitting: Internal Medicine

## 2010-11-26 ENCOUNTER — Encounter: Payer: Self-pay | Admitting: Internal Medicine

## 2010-11-26 VITALS — BP 146/84 | HR 69 | Temp 97.5°F | Ht 68.5 in | Wt 220.0 lb

## 2010-11-26 DIAGNOSIS — D649 Anemia, unspecified: Secondary | ICD-10-CM

## 2010-11-26 DIAGNOSIS — I10 Essential (primary) hypertension: Secondary | ICD-10-CM

## 2010-11-26 DIAGNOSIS — E119 Type 2 diabetes mellitus without complications: Secondary | ICD-10-CM

## 2010-11-26 LAB — GLUCOSE, CAPILLARY: Glucose-Capillary: 205 mg/dL — ABNORMAL HIGH (ref 70–99)

## 2010-11-26 NOTE — Patient Instructions (Signed)
Please make a followup appointment in 3-4 months. Meanwhile please get your eyes checked by a doctor. If anything else comes up in between, call us and make an early appointment.

## 2010-11-26 NOTE — Assessment & Plan Note (Addendum)
Lab Results  Component Value Date   NA 141 10/06/2010   K 3.8 10/06/2010   CL 107 10/06/2010   CO2 26 11/13/2009   BUN 8 10/06/2010   CREATININE 0.80 10/06/2010    BP Readings from Last 3 Encounters:  11/26/10 146/84  11/20/10 135/83  07/16/10 154/85    Assessment: Hypertension control:  mildly elevated  Progress toward goals:  unchanged Barriers to meeting goals:  no barriers identified  Plan: Hypertension treatment:  continue current medications. I would not make any changes today. If her blood pressure stays persistently elevated, we'll make changes as needed.

## 2010-11-26 NOTE — Assessment & Plan Note (Signed)
Hb has been as low as 11.3 in past. I would do no further testing at this point of time, will do yearly blood work. Early evaluation will be done if prompted due to any symptoms. Discussed this with the patient.

## 2010-11-26 NOTE — Assessment & Plan Note (Addendum)
Lab Results  Component Value Date   HGBA1C 7.4 11/20/2010   HGBA1C 7.8 08/21/2009   CREATININE 0.80 10/06/2010   MICROALBUR 0.91 11/20/2010   MICRALBCREAT 10.5 11/20/2010   CHOL 206* 04/09/2010   HDL 53 04/09/2010   TRIG 237* 04/09/2010    Last eye exam and foot exam:    Component Value Date/Time   HMDIABFOOTEX done 12/23/2009    Assessment: Diabetes control: controlled Progress toward goals: improved Barriers to meeting goals: no barriers identified  Plan: Diabetes treatment: continue current medications.  I will do a referral for eye exam today. Refer to: Ophthalmology. Instruction/counseling given: reminded to get eye exam, reminded to bring blood glucose meter & log to each visit, discussed foot care and discussed diet

## 2010-11-26 NOTE — Progress Notes (Signed)
  Subjective:    Patient ID: Caitlyn Aguirre, female    DOB: 20-Nov-1935, 75 y.o.   MRN: [id number]  HPI [name] is a pleasant 35 year woman with past with history of DM 2, hypothyroidism, HTN, comes the clinic for followup for lab work. She was seen by Dr. Forrestine Him on 11/20/2010 for UTI. She had a CBC, CMP done, which are essentially at baseline.  I discussed this with her. Also she was scheduled for blood pressure followup. Her blood pressure is mildly elevated. She denies any chest pain, short of breath, nausea vomiting, fever, chills, abdominal pain, diarrhea. She does complain of left ear itching for few months now. Has been referred to ENT at [facility] in past for same problem and did not find any serious problems at that time. She denies any ear pain, discharge, hearing loss. She does need to get an eye exam. Rudell Cobb explain her to contact [employer], as she has financial issues and is not able to pay the co-pay.  Review of Systems    as per history of present illness, all other systems reviewed and negative. Objective:   Physical Exam Vitals: Reviewed General: resting in bed HEENT: PERRL, EOMI, no scleral icterus, left ear-dry skin at external opening of ear canal. Some wax noted on otoscopic exam on left. Normal exam of her right ear. Cardiac: RRR, no rubs, murmurs or gallops Pulm: clear to auscultation bilaterally, moving normal volumes of air Abd: soft, nontender, nondistended, BS present Ext: warm and well perfused, no pedal edema Neuro: alert and oriented X3, cranial nerves II-XII grossly intact, strength and sensation to light touch equal in bilateral upper and lower extremities        Assessment & Plan:

## 2010-11-26 NOTE — Assessment & Plan Note (Signed)
Hgb down from 13.3 to 11.8 . No active blood loss reported. Last colonoscopy in 2004 within normal limits. Will recheck CBC.

## 2010-12-24 ENCOUNTER — Encounter: Payer: Self-pay | Admitting: Internal Medicine

## 2010-12-24 ENCOUNTER — Ambulatory Visit (INDEPENDENT_AMBULATORY_CARE_PROVIDER_SITE_OTHER): Payer: Medicare Other | Admitting: Internal Medicine

## 2010-12-24 VITALS — BP 105/59 | HR 75 | Temp 96.9°F | Ht 68.5 in | Wt 216.4 lb

## 2010-12-24 DIAGNOSIS — L02229 Furuncle of trunk, unspecified: Secondary | ICD-10-CM

## 2010-12-24 DIAGNOSIS — L02222 Furuncle of back [any part, except buttock]: Secondary | ICD-10-CM | POA: Insufficient documentation

## 2010-12-24 LAB — GLUCOSE, CAPILLARY: Glucose-Capillary: 131 mg/dL — ABNORMAL HIGH (ref 70–99)

## 2010-12-24 MED ORDER — IBUPROFEN 400 MG PO TABS: 400.0000 mg | ORAL_TABLET | Freq: Three times a day (TID) | ORAL | Status: AC

## 2010-12-24 MED ORDER — DOXYCYCLINE HYCLATE 100 MG PO TABS: 100.0000 mg | ORAL_TABLET | Freq: Two times a day (BID) | ORAL | Status: AC

## 2010-12-24 NOTE — Patient Instructions (Signed)
Please make a followup appointment in a week if the swelling on her back keeps increasing or doesn't get better. If it burst open clinic with warm water and soap. Take ibuprofen 400 mg one tablet 3 times a day for next week and doxycycline 100 mg 1 tablet twice a day for next week. If you don't feel better after a week please make appointment. He might have to drain the boIL out if needed.

## 2010-12-24 NOTE — Assessment & Plan Note (Signed)
2 x 2 cm oval subcutaneous swelling as described in physical exam-likely boil in a patient with diabetes. I will treat her with one week course of doxycycline 100 mg twice a day and a week course of ibuprofen 400 mg 3 times a day. I described her in detail to complete the course even if the boil burst open. Also to check it everyday and if the size keeps increasing make an early appointment. She verbalized understanding.

## 2010-12-24 NOTE — Progress Notes (Signed)
  Subjective:    Patient ID: Caitlyn Aguirre, female    DOB: 1935/04/27, 75 y.o.   MRN: [id number]  HPI Caitlyn Aguirre is a pleasant 75 year woman with past with history of DM 2, HTN, osteoarthritis who comes the clinic with chief complaint of right mid back pain and swelling. She noticed the pain and swelling about 3 days before-on last Sunday. The swelling is increasing in size since then but does not have any redness or opening or discharge. She denies any fever, chills, nausea, vomiting, abdominal pain, chest pain, headache, change of vision. She denies any change in appetite. She takes her diabetes medications regularly and her last HbA1c was 7.6 in October 2012 for She denies having any skin infections in past except for fungal infections under her breasts.     Review of Systems    as per history of present illness, all other systems reviewed and negative. Objective:   Physical Exam Vitals: Reviewed and stable General: NAD HEENT: PERRL, EOMI, no scleral icterus Cardiac: RRR, no rubs, murmurs or gallops Pulm: clear to auscultation bilaterally, moving normal volumes of air Abd: soft, nontender, nondistended, BS present Back: 2 x 2 cm mildly fluctuant subcutaneous swelling, severely tender to palpation, no redness or opening or drainage. A blackhead noticed at the Center. Ext: warm and well perfused, no pedal edema Neuro: alert and oriented X3, cranial nerves II-XII grossly intact, strength and sensation to light touch equal in bilateral upper and lower extremities        Assessment & Plan:

## 2010-12-26 ENCOUNTER — Other Ambulatory Visit: Payer: Self-pay | Admitting: Internal Medicine

## 2011-01-05 ENCOUNTER — Emergency Department (HOSPITAL_COMMUNITY)
Admission: EM | Admit: 2011-01-05 | Discharge: 2011-01-05 | Discharge status: Against Medical Advice | Payer: Medicare Other

## 2011-01-05 NOTE — ED Notes (Signed)
Pt's daughter requesting to know where her mother was located.  When looking at chart it appears pt had had vitals signs and was waiting to be triaged.  Unable to locate pt in triage lobby or ED lobby.  Pt's family called pt on telephone and pt was at home.

## 2011-01-23 ENCOUNTER — Other Ambulatory Visit: Payer: Self-pay | Admitting: *Deleted

## 2011-01-23 ENCOUNTER — Other Ambulatory Visit: Payer: Self-pay | Admitting: Internal Medicine

## 2011-01-23 DIAGNOSIS — M1990 Unspecified osteoarthritis, unspecified site: Secondary | ICD-10-CM

## 2011-01-23 DIAGNOSIS — I10 Essential (primary) hypertension: Secondary | ICD-10-CM

## 2011-01-23 DIAGNOSIS — M25519 Pain in unspecified shoulder: Secondary | ICD-10-CM

## 2011-01-23 MED ORDER — LISINOPRIL 5 MG PO TABS: 5.0000 mg | ORAL_TABLET | Freq: Every day | ORAL | Status: DC

## 2011-01-23 MED ORDER — HYDROCODONE-ACETAMINOPHEN 5-500 MG PO TABS: 1.0000 | ORAL_TABLET | Freq: Two times a day (BID) | ORAL | Status: DC | PRN

## 2011-01-23 MED ORDER — ZOLPIDEM TARTRATE 10 MG PO TABS: ORAL_TABLET | ORAL | Status: DC

## 2011-01-23 MED ORDER — DICLOFENAC SODIUM 1 % TD GEL: 1.0000 "application " | Freq: Four times a day (QID) | TRANSDERMAL | Status: DC | PRN

## 2011-01-23 NOTE — Telephone Encounter (Signed)
Pyt also needs Hydrocodone/apap 5/500 #60 and Voltaren Gel 1% as well.  Pharmacy is asking for 6 months refills if possible.  Valentina Gu 01/23/2011 11:50 AM

## 2011-02-25 ENCOUNTER — Encounter: Payer: 59 | Admitting: Internal Medicine

## 2011-03-04 ENCOUNTER — Telehealth: Payer: Self-pay | Admitting: *Deleted

## 2011-03-04 NOTE — Telephone Encounter (Signed)
Talked with pt about calling Dr Laruth Bouchard office to resch cataract surgery. Was sch 01/12/11 and pt cancel surgery - pt did not voice reason for canceling. Dr Laruth Bouchard office aware. Stanton Kidney Stachia Slutsky RN 03/04/11  9:30AM

## 2011-03-17 ENCOUNTER — Encounter: Payer: Self-pay | Admitting: Internal Medicine

## 2011-03-17 ENCOUNTER — Ambulatory Visit (INDEPENDENT_AMBULATORY_CARE_PROVIDER_SITE_OTHER): Payer: Medicare Other | Admitting: Internal Medicine

## 2011-03-17 VITALS — BP 136/74 | HR 87 | Temp 97.2°F | Ht 68.0 in | Wt 218.7 lb

## 2011-03-17 DIAGNOSIS — I10 Essential (primary) hypertension: Secondary | ICD-10-CM

## 2011-03-17 DIAGNOSIS — G8929 Other chronic pain: Secondary | ICD-10-CM

## 2011-03-17 DIAGNOSIS — H9202 Otalgia, left ear: Secondary | ICD-10-CM

## 2011-03-17 DIAGNOSIS — M549 Dorsalgia, unspecified: Secondary | ICD-10-CM

## 2011-03-17 DIAGNOSIS — Z79899 Other long term (current) drug therapy: Secondary | ICD-10-CM

## 2011-03-17 DIAGNOSIS — R51 Headache: Secondary | ICD-10-CM

## 2011-03-17 DIAGNOSIS — E119 Type 2 diabetes mellitus without complications: Secondary | ICD-10-CM

## 2011-03-17 DIAGNOSIS — H9209 Otalgia, unspecified ear: Secondary | ICD-10-CM

## 2011-03-17 DIAGNOSIS — R519 Headache, unspecified: Secondary | ICD-10-CM | POA: Insufficient documentation

## 2011-03-17 LAB — GLUCOSE, CAPILLARY: Glucose-Capillary: 232 mg/dL — ABNORMAL HIGH (ref 70–99)

## 2011-03-17 LAB — POCT GLYCOSYLATED HEMOGLOBIN (HGB A1C): Hemoglobin A1C: 6.5

## 2011-03-17 MED ORDER — CYCLOBENZAPRINE HCL 5 MG PO TABS: 7.5000 mg | ORAL_TABLET | Freq: Three times a day (TID) | ORAL | Status: DC | PRN

## 2011-03-17 MED ORDER — CYCLOBENZAPRINE HCL 5 MG PO TABS: 5.0000 mg | ORAL_TABLET | Freq: Three times a day (TID) | ORAL | Status: DC | PRN
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
## 2011-03-17 NOTE — Assessment & Plan Note (Signed)
BP Readings from Last 3 Encounters:  03/17/11 136/74  01/05/11 124/73  12/24/10 105/59    Basic Metabolic Panel:    Component Value Date/Time   NA 141 10/06/2010 1201   K 3.8 10/06/2010 1201   CL 107 10/06/2010 1201   CO2 26 11/13/2009 1815   BUN 8 10/06/2010 1201   CREATININE 0.80 10/06/2010 1201   GLUCOSE 153* 10/06/2010 1201   CALCIUM 9.9 11/13/2009 1815    Assessment: Status:  Pt has been taking Amlodipine daily although seems it was dc'ed in 11/2010 due to med noncompliance. BP elevated today mildly from prior, likely due to pain.  Disease Control: controlled  Progress toward goals: at goal  Barriers to meeting goals: no barriers identified   Plan:  continue current medications  Consider to DC lasix in the future given pt report of frequent urination.
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
## 2011-03-17 NOTE — Assessment & Plan Note (Signed)
Likely due to tension versus migraine headache, likely also affected by acute worsened back pain - that seems to be her main focus. No neurologic deficits.  Continue current medications.

## 2011-03-17 NOTE — Patient Instructions (Signed)
   Please follow-up at the clinic in 2 weeks, at which time we will reevaluate your back pain, diabetes, blood pressure  There have been changes in your medications:  START Flexeril - You have been started on a new medication that can cause drowsiness, do not drive or operate heavy machinery . Do not take this medication with alcohol.    Please follow-up in the clinic sooner if needed.  If you have been started on new medication(s), and you develop symptoms concerning for allergic reaction, including, but not limited to, throat closing, tongue swelling, rash, please stop the medication immediately and call the clinic at [phone], and go to the ER.  If you are diabetic, please bring your meter to your next visit.  If symptoms worsen, or new symptoms arise, please call the clinic or go to the ER.  Please bring all of your medications in a bag to your next visit.

## 2011-03-17 NOTE — Assessment & Plan Note (Signed)
Unclear etiology, exam benign, no symptoms of vertigo.   Continue to monitor for now.

## 2011-03-23 ENCOUNTER — Other Ambulatory Visit: Payer: Self-pay | Admitting: Internal Medicine

## 2011-03-23 DIAGNOSIS — M549 Dorsalgia, unspecified: Secondary | ICD-10-CM

## 2011-03-23 DIAGNOSIS — G8929 Other chronic pain: Secondary | ICD-10-CM

## 2011-03-25 ENCOUNTER — Other Ambulatory Visit: Payer: Self-pay | Admitting: Ophthalmology
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
## 2011-03-30 MED ORDER — CYCLOBENZAPRINE HCL 5 MG PO TABS: 7.5000 mg | ORAL_TABLET | Freq: Three times a day (TID) | ORAL | Status: DC | PRN

## 2011-04-03 ENCOUNTER — Other Ambulatory Visit (HOSPITAL_COMMUNITY): Payer: Medicare Other

## 2011-04-03 ENCOUNTER — Encounter (HOSPITAL_COMMUNITY): Payer: Self-pay | Admitting: Pharmacy Technician

## 2011-04-03 ENCOUNTER — Encounter (HOSPITAL_COMMUNITY)
Admission: RE | Admit: 2011-04-03 | Discharge: 2011-04-03 | Disposition: A | Discharge status: Home / Self Care | Payer: Medicare Other | Source: Ambulatory Visit | Attending: Ophthalmology | Admitting: Ophthalmology

## 2011-04-03 ENCOUNTER — Encounter (HOSPITAL_COMMUNITY): Payer: Self-pay

## 2011-04-03 ENCOUNTER — Other Ambulatory Visit: Payer: Self-pay

## 2011-04-03 ENCOUNTER — Encounter (HOSPITAL_COMMUNITY)
Admission: RE | Admit: 2011-04-03 | Discharge: 2011-04-03 | Disposition: A | Discharge status: Home / Self Care | Payer: Medicare Other | Source: Ambulatory Visit | Attending: Anesthesiology | Admitting: Anesthesiology

## 2011-04-03 HISTORY — DX: Acute myocardial infarction, unspecified: I21.9

## 2011-04-03 HISTORY — DX: Hypothyroidism, unspecified: E03.9

## 2011-04-03 HISTORY — DX: Cerebral infarction, unspecified: I63.9

## 2011-04-03 HISTORY — DX: Reserved for inherently not codable concepts without codable children: IMO0001

## 2011-04-03 HISTORY — DX: Encounter for other specified aftercare: Z51.89

## 2011-04-03 LAB — CBC
HCT: 35.9 % — ABNORMAL LOW (ref 36.0–46.0)
Hemoglobin: 12.3 g/dL (ref 12.0–15.0)
MCH: 29.5 pg (ref 26.0–34.0)
MCHC: 34.3 g/dL (ref 30.0–36.0)
MCV: 86.1 fL (ref 78.0–100.0)
Platelets: 205 10*3/uL (ref 150–400)
RBC: 4.17 MIL/uL (ref 3.87–5.11)
RDW: 14.2 % (ref 11.5–15.5)
WBC: 6.6 10*3/uL (ref 4.0–10.5)

## 2011-04-03 LAB — BASIC METABOLIC PANEL
BUN: 12 mg/dL (ref 6–23)
CO2: 27 mEq/L (ref 19–32)
Calcium: 10.6 mg/dL — ABNORMAL HIGH (ref 8.4–10.5)
Chloride: 108 mEq/L (ref 96–112)
Creatinine, Ser: 0.82 mg/dL (ref 0.50–1.10)
GFR calc Af Amer: 80 mL/min — ABNORMAL LOW (ref >90)
GFR calc non Af Amer: 69 mL/min — ABNORMAL LOW (ref >90)
Glucose, Bld: 158 mg/dL — ABNORMAL HIGH (ref 70–99)
Potassium: 4.1 mEq/L (ref 3.5–5.1)
Sodium: 142 mEq/L (ref 135–145)

## 2011-04-03 NOTE — Pre-Procedure Instructions (Signed)
20 Caitlyn Aguirre  04/03/2011   Your procedure is scheduled on:  Wednesday, March 6  Report to [facility] at 9:00 AM.  Call this number if you have problems the morning of surgery: [phone]   Remember:   Do not eat food:After Midnight.  May have clear liquids: up to 4 Hours before arrival.  Clear liquids include soda, tea, black coffee, apple or grape juice, broth.  Take these medicines the morning of surgery with A SIP OF WATER: Amlodipine, Coreg, Synthroid, Paxil, Zantac.  Vicodin and Flexeril if needed.  Albuterol inhaler if needed, bring to surgery    Do not wear jewelry, make-up or nail polish.  Do not wear lotions, powders, or perfumes. You may wear deodorant.  Do not shave 48 hours prior to surgery.  Do not bring valuables to the hospital.  Contacts, dentures or bridgework may not be worn into surgery.  Leave suitcase in the car. After surgery it may be brought to your room.  For patients admitted to the hospital, checkout time is 11:00 AM the day of discharge.   Patients discharged the day of surgery will not be allowed to drive home.  Name and phone number of your driver:    Special Instructions: CHG Shower Use Special Wash: 1/2 bottle night before surgery and 1/2 bottle morning of surgery.   Please read over the following fact sheets that you were given: Pain Booklet, Coughing and Deep Breathing and Surgical Site Infection Prevention

## 2011-04-07 MED ORDER — GATIFLOXACIN 0.5 % OP SOLN
1.0000 [drp] | OPHTHALMIC | Status: AC | PRN
  Administered 2011-04-08 (×3): 1 [drp] via OPHTHALMIC
  Filled 2011-04-07: qty 2.5

## 2011-04-07 MED ORDER — PHENYLEPHRINE HCL 2.5 % OP SOLN
1.0000 [drp] | OPHTHALMIC | Status: AC | PRN
  Administered 2011-04-08 (×3): 1 [drp] via OPHTHALMIC
  Filled 2011-04-07: qty 3

## 2011-04-07 MED ORDER — PREDNISOLONE ACETATE 1 % OP SUSP
1.0000 [drp] | OPHTHALMIC | Status: AC
  Administered 2011-04-08: 1 [drp] via OPHTHALMIC
  Filled 2011-04-07: qty 5

## 2011-04-07 MED ORDER — TETRACAINE HCL 0.5 % OP SOLN
2.0000 [drp] | OPHTHALMIC | Status: AC
  Administered 2011-04-08: 2 [drp] via OPHTHALMIC
  Filled 2011-04-07: qty 2

## 2011-04-08 ENCOUNTER — Encounter (HOSPITAL_COMMUNITY): Payer: Self-pay | Admitting: *Deleted

## 2011-04-08 ENCOUNTER — Ambulatory Visit (HOSPITAL_COMMUNITY): Payer: Medicare Other | Admitting: *Deleted

## 2011-04-08 ENCOUNTER — Encounter (HOSPITAL_COMMUNITY)
Admission: RE | Disposition: A | Discharge status: Home / Self Care | Payer: Self-pay | Source: Ambulatory Visit | Attending: Ophthalmology

## 2011-04-08 ENCOUNTER — Ambulatory Visit (HOSPITAL_COMMUNITY)
Admission: RE | Admit: 2011-04-08 | Discharge: 2011-04-08 | Disposition: A | Discharge status: Home / Self Care | Payer: Medicare Other | Source: Ambulatory Visit | Attending: Ophthalmology | Admitting: Ophthalmology

## 2011-04-08 DIAGNOSIS — J4489 Other specified chronic obstructive pulmonary disease: Secondary | ICD-10-CM | POA: Insufficient documentation

## 2011-04-08 DIAGNOSIS — J449 Chronic obstructive pulmonary disease, unspecified: Secondary | ICD-10-CM | POA: Insufficient documentation

## 2011-04-08 DIAGNOSIS — K219 Gastro-esophageal reflux disease without esophagitis: Secondary | ICD-10-CM | POA: Insufficient documentation

## 2011-04-08 DIAGNOSIS — E119 Type 2 diabetes mellitus without complications: Secondary | ICD-10-CM | POA: Insufficient documentation

## 2011-04-08 DIAGNOSIS — Z01812 Encounter for preprocedural laboratory examination: Secondary | ICD-10-CM | POA: Insufficient documentation

## 2011-04-08 DIAGNOSIS — I10 Essential (primary) hypertension: Secondary | ICD-10-CM | POA: Insufficient documentation

## 2011-04-08 DIAGNOSIS — H269 Unspecified cataract: Secondary | ICD-10-CM | POA: Insufficient documentation

## 2011-04-08 HISTORY — PX: CATARACT EXTRACTION W/PHACO: SHX586

## 2011-04-08 LAB — GLUCOSE, CAPILLARY
Glucose-Capillary: 105 mg/dL — ABNORMAL HIGH (ref 70–99)
Glucose-Capillary: 127 mg/dL — ABNORMAL HIGH (ref 70–99)

## 2011-04-08 SURGERY — PHACOEMULSIFICATION, CATARACT, WITH IOL INSERTION
Anesthesia: Monitor Anesthesia Care | Site: Eye | Laterality: Left | Wound class: Clean

## 2011-04-08 MED ORDER — BSS IO SOLN
INTRAOCULAR | Status: DC | PRN
  Administered 2011-04-08: 500 mL via INTRAOCULAR

## 2011-04-08 MED ORDER — BUPIVACAINE HCL 0.75 % IJ SOLN
INTRAMUSCULAR | Status: DC | PRN
  Administered 2011-04-08: 10 mL

## 2011-04-08 MED ORDER — EPINEPHRINE HCL 1 MG/ML IJ SOLN
INTRAMUSCULAR | Status: DC | PRN
  Administered 2011-04-08: 1 mg

## 2011-04-08 MED ORDER — DEXAMETHASONE SODIUM PHOSPHATE 10 MG/ML IJ SOLN
INTRAMUSCULAR | Status: DC | PRN
  Administered 2011-04-08: 10 mg

## 2011-04-08 MED ORDER — TRAMADOL HCL 50 MG PO TABS
50.0000 mg | ORAL_TABLET | ORAL | Status: AC
  Administered 2011-04-08: 50 mg via ORAL
  Filled 2011-04-08: qty 1

## 2011-04-08 MED ORDER — CEFAZOLIN SUBCONJUNCTIVAL INJECTION 100 MG/0.5 ML
200.0000 mg | INJECTION | SUBCONJUNCTIVAL | Status: AC
  Administered 2011-04-08: 200 mg via SUBCONJUNCTIVAL
  Filled 2011-04-08: qty 1

## 2011-04-08 MED ORDER — FENTANYL CITRATE 0.05 MG/ML IJ SOLN: 25.0000 ug | INTRAMUSCULAR | Status: DC | PRN

## 2011-04-08 MED ORDER — FENTANYL CITRATE 0.05 MG/ML IJ SOLN
INTRAMUSCULAR | Status: DC | PRN
  Administered 2011-04-08: 25 ug via INTRAVENOUS
  Administered 2011-04-08: 50 ug via INTRAVENOUS

## 2011-04-08 MED ORDER — SODIUM CHLORIDE 0.9 % IV SOLN
INTRAVENOUS | Status: DC | PRN
  Administered 2011-04-08: 11:00:00 via INTRAVENOUS

## 2011-04-08 MED ORDER — ONDANSETRON HCL 4 MG/2ML IJ SOLN
INTRAMUSCULAR | Status: DC | PRN
  Administered 2011-04-08: 4 mg via INTRAVENOUS

## 2011-04-08 MED ORDER — NA CHONDROIT SULF-NA HYALURON 40-30 MG/ML IO SOLN
INTRAOCULAR | Status: DC | PRN
  Administered 2011-04-08: 0.5 mL via INTRAOCULAR

## 2011-04-08 MED ORDER — PROVISC 10 MG/ML IO SOLN
INTRAOCULAR | Status: DC | PRN
  Administered 2011-04-08: .85 mL via INTRAOCULAR

## 2011-04-08 MED ORDER — HYPROMELLOSE (GONIOSCOPIC) 2.5 % OP SOLN
OPHTHALMIC | Status: DC | PRN
  Administered 2011-04-08: 2 [drp] via OPHTHALMIC

## 2011-04-08 MED ORDER — PHENYLEPHRINE HCL 10 MG/ML IJ SOLN
INTRAMUSCULAR | Status: DC | PRN
  Administered 2011-04-08: 40 ug via INTRAVENOUS
  Administered 2011-04-08: 120 ug via INTRAVENOUS
  Administered 2011-04-08 (×2): 80 ug via INTRAVENOUS

## 2011-04-08 MED ORDER — SODIUM CHLORIDE 0.9 % IV SOLN
INTRAVENOUS | Status: DC
  Administered 2011-04-08: 11:00:00 via INTRAVENOUS

## 2011-04-08 MED ORDER — BSS PLUS IO SOLN
INTRAOCULAR | Status: DC | PRN
  Administered 2011-04-08: 1 via OPHTHALMIC

## 2011-04-08 MED ORDER — ROCURONIUM BROMIDE 100 MG/10ML IV SOLN
INTRAVENOUS | Status: DC | PRN
  Administered 2011-04-08: 40 mg via INTRAVENOUS

## 2011-04-08 MED ORDER — TRAMADOL HCL 50 MG PO TABS: 50.0000 mg | ORAL_TABLET | ORAL | Status: DC | PRN

## 2011-04-08 MED ORDER — LIDOCAINE HCL (CARDIAC) 20 MG/ML IV SOLN
INTRAVENOUS | Status: DC | PRN
  Administered 2011-04-08: 10 mg via INTRAVENOUS

## 2011-04-08 MED ORDER — NEOSTIGMINE METHYLSULFATE 1 MG/ML IJ SOLN
INTRAMUSCULAR | Status: DC | PRN
  Administered 2011-04-08: 4 mg via INTRAVENOUS

## 2011-04-08 MED ORDER — ONDANSETRON HCL 4 MG/2ML IJ SOLN: 4.0000 mg | Freq: Four times a day (QID) | INTRAMUSCULAR | Status: DC | PRN

## 2011-04-08 MED ORDER — PROPOFOL 10 MG/ML IV EMUL
INTRAVENOUS | Status: DC | PRN
  Administered 2011-04-08: 150 mg via INTRAVENOUS

## 2011-04-08 MED ORDER — BACITRACIN-POLYMYXIN B 500-10000 UNIT/GM OP OINT
TOPICAL_OINTMENT | OPHTHALMIC | Status: DC | PRN
  Administered 2011-04-08: 1 via OPHTHALMIC

## 2011-04-08 MED ORDER — GLYCOPYRROLATE 0.2 MG/ML IJ SOLN
INTRAMUSCULAR | Status: DC | PRN
  Administered 2011-04-08: .7 mg via INTRAVENOUS

## 2011-04-08 SURGICAL SUPPLY — 57 items
APPLICATOR COTTON TIP 6IN STRL (MISCELLANEOUS) ×2 IMPLANT
APPLICATOR DR MATTHEWS STRL (MISCELLANEOUS) ×2 IMPLANT
BLADE EYE MINI 60D BEAVER (BLADE) IMPLANT
BLADE KERATOME 2.75 (BLADE) ×2 IMPLANT
BLADE STAB KNIFE 15DEG (BLADE) IMPLANT
CANNULA ANTERIOR CHAMBER 27GA (MISCELLANEOUS) IMPLANT
CLOTH BEACON ORANGE TIMEOUT ST (SAFETY) ×2 IMPLANT
DRAPE OPHTHALMIC 77X100 STRL (CUSTOM PROCEDURE TRAY) ×2 IMPLANT
DRAPE POUCH INSTRU U-SHP 10X18 (DRAPES) ×2 IMPLANT
DRSG TEGADERM 4X4.75 (GAUZE/BANDAGES/DRESSINGS) ×2 IMPLANT
EYE SHIELD UNIVERSAL CLEAR (GAUZE/BANDAGES/DRESSINGS) ×2 IMPLANT
FILTER BLUE MILLIPORE (MISCELLANEOUS) IMPLANT
GLOVE SS BIOGEL STRL SZ 6.5 (GLOVE) ×3 IMPLANT
GLOVE SUPERSENSE BIOGEL SZ 6.5 (GLOVE) ×3
GOWN PREVENTION PLUS XLARGE (GOWN DISPOSABLE) ×2 IMPLANT
GOWN STRL NON-REIN LRG LVL3 (GOWN DISPOSABLE) ×4 IMPLANT
KIT BASIN OR (CUSTOM PROCEDURE TRAY) ×2 IMPLANT
KIT ROOM TURNOVER OR (KITS) IMPLANT
KNIFE GRIESHABER SHARP 2.5MM (MISCELLANEOUS) ×2 IMPLANT
LENS IOL ACRYSOF MP POST 18.0 (Intraocular Lens) ×2 IMPLANT
MARKER SKIN DUAL TIP RULER LAB (MISCELLANEOUS) ×2 IMPLANT
NEEDLE 18GX1X1/2 (RX/OR ONLY) (NEEDLE) IMPLANT
NEEDLE 22X1 1/2 (OR ONLY) (NEEDLE) ×2 IMPLANT
NEEDLE 25GX 5/8IN NON SAFETY (NEEDLE) ×2 IMPLANT
NEEDLE FILTER BLUNT 18X 1/2SAF (NEEDLE)
NEEDLE FILTER BLUNT 18X1 1/2 (NEEDLE) IMPLANT
NEEDLE HYPO 30X.5 LL (NEEDLE) ×4 IMPLANT
NS IRRIG 1000ML POUR BTL (IV SOLUTION) ×2 IMPLANT
PACK CATARACT CUSTOM (CUSTOM PROCEDURE TRAY) ×2 IMPLANT
PACK CATARACT MCHSCP (PACKS) ×2 IMPLANT
PACK COMBINED CATERACT/VIT 23G (OPHTHALMIC RELATED) IMPLANT
PAD ARMBOARD 7.5X6 YLW CONV (MISCELLANEOUS) ×4 IMPLANT
PAD EYE OVAL STERILE LF (GAUZE/BANDAGES/DRESSINGS) ×2 IMPLANT
PHACO TIP KELMAN 45DEG (TIP) ×2 IMPLANT
PROBE ANTERIOR 20G W/INFUS NDL (MISCELLANEOUS) IMPLANT
ROLLS DENTAL (MISCELLANEOUS) ×4 IMPLANT
SHUTTLE MONARCH TYPE A (NEEDLE) ×2 IMPLANT
SOLUTION ANTI FOG 6CC (MISCELLANEOUS) ×2 IMPLANT
SPEAR EYE SURG WECK-CEL (MISCELLANEOUS) ×6 IMPLANT
SUT ETHILON 10-0 CS-B-6CS-B-6 (SUTURE)
SUT ETHILON 5 0 P 3 18 (SUTURE)
SUT ETHILON 9 0 TG140 8 (SUTURE) IMPLANT
SUT NYLON ETHILON 5-0 P-3 1X18 (SUTURE) IMPLANT
SUT PLAIN 6 0 TG1408 (SUTURE) IMPLANT
SUT POLY NON ABSORB 10-0 8 STR (SUTURE) IMPLANT
SUT VICRYL 6 0 S 29 12 (SUTURE) IMPLANT
SUTURE EHLN 10-0 CS-B-6CS-B-6 (SUTURE) IMPLANT
SYR 20CC LL (SYRINGE) IMPLANT
SYR 5ML LL (SYRINGE) IMPLANT
SYR TB 1ML LUER SLIP (SYRINGE) IMPLANT
SYRINGE 10CC LL (SYRINGE) IMPLANT
TAPE PAPER MEDFIX 1IN X 10YD (GAUZE/BANDAGES/DRESSINGS) ×2 IMPLANT
TAPE SURG TRANSPORE 1 IN (GAUZE/BANDAGES/DRESSINGS) ×1 IMPLANT
TAPE SURGICAL TRANSPORE 1 IN (GAUZE/BANDAGES/DRESSINGS) ×1
TOWEL OR 17X24 6PK STRL BLUE (TOWEL DISPOSABLE) ×4 IMPLANT
WATER STERILE IRR 1000ML POUR (IV SOLUTION) ×2 IMPLANT
WIPE INSTRUMENT VISIWIPE 73X73 (MISCELLANEOUS) ×2 IMPLANT

## 2011-04-08 NOTE — Transfer of Care (Signed)
Immediate Anesthesia Transfer of Care Note  Patient: Caitlyn Aguirre  Procedure(s) Performed: Procedure(s) (LRB): CATARACT EXTRACTION PHACO AND INTRAOCULAR LENS PLACEMENT (IOC) (Left)  Patient Location: PACU  Anesthesia Type: General  Level of Consciousness: awake, alert  and oriented  Airway & Oxygen Therapy: Patient Spontanous Breathing and Patient connected to nasal cannula oxygen  Post-op Assessment: Report given to PACU RN and Post -op Vital signs reviewed and stable  Post vital signs: Reviewed and stable  Complications: No apparent anesthesia complications
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
## 2011-04-08 NOTE — Interval H&P Note (Signed)
History and Physical Interval Note:  04/08/2011 8:43 AM  Caitlyn Aguirre  has presented today for surgery, with the diagnosis of Nuclear Sclerotic cataract OS  The various methods of treatment have been discussed with the patient and family. After consideration of risks, benefits and other options for treatment, the patient has consented to  Procedure(s) (LRB): CATARACT EXTRACTION PHACO AND INTRAOCULAR LENS PLACEMENT (IOC) (Left) as a surgical intervention .  The patients' history has been reviewed, patient examined, no change in status, stable for surgery.  I have reviewed the patients' chart and labs.  Questions were answered to the patient's satisfaction.     Shade Flood, MD

## 2011-04-08 NOTE — Anesthesia Preprocedure Evaluation (Signed)
Anesthesia Evaluation  Patient identified by MRN, date of birth, ID band Patient awake    Reviewed: Allergy & Precautions, H&P , NPO status , Patient's Chart, lab work & pertinent test results  Airway Mallampati: II  Neck ROM: full    Dental   Pulmonary shortness of breath, COPD         Cardiovascular hypertension, + CAD and + Past MI     Neuro/Psych  Headaches, PSYCHIATRIC DISORDERS Depression CVA    GI/Hepatic GERD-  ,  Endo/Other  Diabetes mellitus-, Type 2Hypothyroidism   Renal/GU      Musculoskeletal   Abdominal   Peds  Hematology   Anesthesia Other Findings   Reproductive/Obstetrics                           Anesthesia Physical Anesthesia Plan  ASA: III  Anesthesia Plan: MAC   Post-op Pain Management:    Induction: Intravenous  Airway Management Planned:   Additional Equipment:   Intra-op Plan:   Post-operative Plan:   Informed Consent: I have reviewed the patients History and Physical, chart, labs and discussed the procedure including the risks, benefits and alternatives for the proposed anesthesia with the patient or authorized representative who has indicated his/her understanding and acceptance.     Plan Discussed with: CRNA and Surgeon  Anesthesia Plan Comments:         Anesthesia Quick Evaluation

## 2011-04-08 NOTE — Anesthesia Postprocedure Evaluation (Signed)
Anesthesia Post Note  Patient: Caitlyn Aguirre  Procedure(s) Performed: Procedure(s) (LRB): CATARACT EXTRACTION PHACO AND INTRAOCULAR LENS PLACEMENT (IOC) (Left)  Anesthesia type: General  Patient location: PACU  Post pain: Pain level controlled and Adequate analgesia  Post assessment: Post-op Vital signs reviewed, Patient's Cardiovascular Status Stable, Respiratory Function Stable, Patent Airway and Pain level controlled  Last Vitals:  Filed Vitals:   04/08/11 1215  BP: 138/79  Pulse: 70  Temp: 36.7 C  Resp: 17    Post vital signs: Reviewed and stable  Level of consciousness: awake, alert  and oriented  Complications: No apparent anesthesia complications

## 2011-04-08 NOTE — Preoperative (Signed)
Beta Blockers   Reason not to administer Beta Blockers:Not Applicable, took Coreg this AM at 0700

## 2011-04-08 NOTE — Discharge Instructions (Signed)
KEYSHAWNA Aguirre      04/08/2011  Post-operative instructions for Caitlyn Aguirre L. Caitlyn Kindred, MD  Caring for your eye:  Do not rub your eye and wash your hands before touching the eye area. This is important to avoid injury and infection.  You may use sterile gauze pads and sterile eye wash to cleanse the lid margins of mucous accumulation.  DO NOT REUSE GAUZE after wiping the eye. Use a new clean one if needed.  Be certain not to touch the top of the medication bottle to the eyelids to avoid contaminating your medicine bottle and causing infection.  After eye surgery the surface of the eye and eyelids may be puffy. You may note a red blotch(s) on the surface of the eye or a bruise on your eyelid. These are usually related to injections or instruments used in surgery and are not cause for alarm. You may also notice blood tinged tears on your eye pad, this is common and not cause for alarm. These findings will subside over the coming week or two.  Activity:  No jarring activities. Walking with assistance early on as needed is advised. Avoid straining and let me know if you have significant constipation. Do not bend over at the waist with the head below your waist to minimize risk of bleeding inside the eye.  Avoid getting water from washing your hair or showering  in your eye. Patch the eye if necessary during bathing to avoid contamination.    You may: watch television, work on you computer, read books, eat out, ride in a car.  Sleeping Position: May sleep in  Your normal position for sleeping but  With the plastic sheild on for two weeks.Caitlyn Aguirre      04/08/2011  Post-operative instructions for Caitlyn Aguirre L. Caitlyn Kindred, MD  Caring for your eye:  Do not rub your eye and wash your hands before touching the eye area. This is important to avoid injury and infection.  You may use sterile gauze pads and sterile eye wash to cleanse the lid margins of mucous accumulation.  DO NOT REUSE GAUZE after wiping the  eye. Use a new clean one if needed.  Be certain not to touch the top of the medication bottle to the eyelids to avoid contaminating your medicine bottle and causing infection.  After eye surgery the surface of the eye and eyelids may be puffy. You may note a red blotch(s) on the surface of the eye or a bruise on your eyelid. These are usually related to injections or instruments used in surgery and are not cause for alarm. You may also notice blood tinged tears on your eye pad, this is common and not cause for alarm. These findings will subside over the coming week or two.  Activity:  No jarring activities. Walking with assistance early on as needed is advised. Avoid straining and let me know if you have significant constipation. Do not bend over at the waist with the head below your waist to minimize risk of bleeding inside the eye.  Avoid getting water from washing your hair or showering  in your eye. Patch the eye if necessary during bathing to avoid contamination.    You may: watch television, work on you computer, read books, eat out, ride in a car.  Sleeping Position:    **  If you do not have a gas bubble in your eye, you may sleep in your         customary manner.    **  If you DO have a gas bubble in your eye, DO NOT SLEEP ON YOUR BACK!!!              Doing so can cause a very high eye pressure and cause eye injury.    DO NOT travel in an airplane or drive to altitude above 1610 ft above sea level if you have a gas bubble in your eye. This also can cause very high eye pressure.  Wear your eye shield for naps and sleeping at night for the first two weeks.   Wait a few minutes between your eye drops when placing them.   Resume your customary medications on your normal schedule. Shade Flood MD    After office hours I can be reached by calling: [phone]                                                                     or   [phone]      Wear your eye shield  for naps and sleeping at night for the first two weeks.   Wait a few minutes between your eye drops when placing them.   Resume your customary medications on your normal schedule. Shade Flood MD    After office hours I can be reached by calling: [phone]                                                                     or   [phone]

## 2011-04-08 NOTE — Progress Notes (Signed)
Incentive spirometer used for patient due to sats in upper 80s when not on o2.

## 2011-04-08 NOTE — Op Note (Signed)
Caitlyn Aguirre 04/08/2011 Cataract  Procedure: Phacoemulsification, Posterior Chamber Intra-ocular Lens Operative Eye:  left eye  Surgeon: Shade Flood Estimated Blood Loss: minimal Specimens for Pathology:  None Complications: none  The patient was prepared and draped in the usual manner for ocular surgery on the left eye. A Cook lid speculum was placed. A peripheral clear corneal incision was made at the surgical limbus centered at the 11:00 meridian. A separate clear corneal stab incision was made with a 15 degree blade at the 2:00 meridian to permit bi-manual technique. Provisc was instilled into the anterior chamber through that incision.  A keratome was used to create a self sealing incision entering the anterior chamber at the 11:00 meridian. A capsulorhexis was performed using a bent 25g needle. The lens was hydrodissected and the nucleus was hydrodilineated using a Nichammin cannula. The Chang chopper was inserted and used to rotate the lens to insure adequate lens mobility. The phacoemulsification handpiece was inserted and a combined phaco-chop technique was employed, fracturing the lens into separate sections with subsequent removal with the phaco handpiece.   The I/A cannula was used to remove remaining lens cortex. Provisc was instilled and used to deepen the anterior chamber and posterior capsule bag. The Monarch injector was used to place a folded Acrysof [id number] PC IOL, + 17.00  diopters, into the capsule bag. A Sinskey lens hook was used to dial in the trailing haptic.  The I/A cannula was used to remove the viscoelastic from the anterior chamber. BSS was used to bring IOP to the desired range and the wound was checked to insure it was watertight. Subconjunctival injections of Ance 100/0.75ml and Dexamethasone 4mg /50ml were placed without complication. The lid speculum and drapes were removed and the patient's eye was patched with Polymixin/Bacitracin ophthalmic ointment. An eye shield  was placed and the patient was transferred alert and conversant from the operating room to the post-operative recovery area.   Shade Flood, MD

## 2011-04-09 LAB — GLUCOSE, CAPILLARY: Glucose-Capillary: 122 mg/dL — ABNORMAL HIGH (ref 70–99)

## 2011-04-22 ENCOUNTER — Encounter (HOSPITAL_COMMUNITY): Payer: Self-pay | Admitting: Ophthalmology

## 2011-05-11 ENCOUNTER — Emergency Department (INDEPENDENT_AMBULATORY_CARE_PROVIDER_SITE_OTHER): Payer: Medicare Other

## 2011-05-11 ENCOUNTER — Encounter (HOSPITAL_COMMUNITY): Payer: Self-pay

## 2011-05-11 ENCOUNTER — Emergency Department (INDEPENDENT_AMBULATORY_CARE_PROVIDER_SITE_OTHER)
Admission: EM | Admit: 2011-05-11 | Discharge: 2011-05-11 | Disposition: A | Discharge status: Home / Self Care | Payer: Medicare Other | Source: Home / Self Care | Attending: Family Medicine | Admitting: Family Medicine

## 2011-05-11 DIAGNOSIS — J40 Bronchitis, not specified as acute or chronic: Secondary | ICD-10-CM

## 2011-05-11 MED ORDER — FLUTICASONE PROPIONATE HFA 110 MCG/ACT IN AERO: 1.0000 | INHALATION_SPRAY | Freq: Two times a day (BID) | RESPIRATORY_TRACT | Status: DC

## 2011-05-11 MED ORDER — HYDROCODONE-ACETAMINOPHEN 7.5-500 MG/15ML PO SOLN: 8.0000 mL | Freq: Three times a day (TID) | ORAL | Status: AC | PRN

## 2011-05-11 MED ORDER — BENZONATATE 100 MG PO CAPS: 100.0000 mg | ORAL_CAPSULE | Freq: Three times a day (TID) | ORAL | Status: AC

## 2011-05-11 MED ORDER — AZITHROMYCIN 250 MG PO TABS: 250.0000 mg | ORAL_TABLET | Freq: Every day | ORAL | Status: AC

## 2011-05-11 NOTE — Discharge Instructions (Signed)
And your EKG took heart activity test looks good. Your chest x-ray is not showing changes from prior x-rays. Does not show signs of pneumonia. My impression is that you have bronchitis. Take the prescribed medications as instructed. Continue to use your albuterol inhaler and take your other medications as previously prescribed. Use nasal saline spray at least 3 times a day. This is over-the-counter. Go to the emergency department if if new symptoms like fever or worsening symptoms like difficulty breathing, shortness of breath or chest pain. Otherwise followup with your primary care provider at the outpatient clinic in the next 3-5 days. Call to make an acute visit appointment.

## 2011-05-11 NOTE — ED Notes (Signed)
C/o chest , sides hurt and has a HA; chest rattles when she coughs duration since Friday, gradually worse as days progress; mnon-tender w palpation, but c/o "tearing apart'
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
## 2011-05-11 NOTE — ED Notes (Signed)
Eye incision was absent on arrival to [facility] and was not cleared from last [facility] visit on release

## 2011-05-14 ENCOUNTER — Other Ambulatory Visit: Payer: Self-pay | Admitting: *Deleted

## 2011-05-14 MED ORDER — CYCLOBENZAPRINE HCL 5 MG PO TABS: 7.5000 mg | ORAL_TABLET | Freq: Three times a day (TID) | ORAL | Status: DC | PRN

## 2011-05-14 NOTE — Telephone Encounter (Signed)
[facility] has pt taking - Take 1 1/2 tabs (7.5mg ) by mouth every 8 hrs as needed for muscle spasms.

## 2011-05-28 ENCOUNTER — Inpatient Hospital Stay (HOSPITAL_COMMUNITY)
Admission: EM | Admit: 2011-05-28 | Discharge: 2011-05-29 | DRG: 191 | Disposition: A | Discharge status: Home / Self Care | Payer: Medicare Other | Attending: Internal Medicine | Admitting: Internal Medicine

## 2011-05-28 ENCOUNTER — Emergency Department (HOSPITAL_COMMUNITY): Payer: Medicare Other

## 2011-05-28 ENCOUNTER — Encounter (HOSPITAL_COMMUNITY): Payer: Self-pay | Admitting: Emergency Medicine

## 2011-05-28 ENCOUNTER — Emergency Department (INDEPENDENT_AMBULATORY_CARE_PROVIDER_SITE_OTHER)
Admission: EM | Admit: 2011-05-28 | Discharge: 2011-05-28 | Disposition: A | Discharge status: Nursing Home (Includes ICF) | Payer: Medicare Other | Source: Home / Self Care | Attending: Emergency Medicine | Admitting: Emergency Medicine

## 2011-05-28 ENCOUNTER — Encounter (HOSPITAL_COMMUNITY): Payer: Self-pay | Admitting: *Deleted

## 2011-05-28 ENCOUNTER — Inpatient Hospital Stay (HOSPITAL_COMMUNITY): Payer: Medicare Other

## 2011-05-28 DIAGNOSIS — I2589 Other forms of chronic ischemic heart disease: Secondary | ICD-10-CM | POA: Diagnosis present

## 2011-05-28 DIAGNOSIS — R0989 Other specified symptoms and signs involving the circulatory and respiratory systems: Secondary | ICD-10-CM

## 2011-05-28 DIAGNOSIS — R0600 Dyspnea, unspecified: Secondary | ICD-10-CM

## 2011-05-28 DIAGNOSIS — J449 Chronic obstructive pulmonary disease, unspecified: Secondary | ICD-10-CM | POA: Diagnosis present

## 2011-05-28 DIAGNOSIS — J441 Chronic obstructive pulmonary disease with (acute) exacerbation: Principal | ICD-10-CM | POA: Diagnosis present

## 2011-05-28 DIAGNOSIS — I10 Essential (primary) hypertension: Secondary | ICD-10-CM | POA: Diagnosis present

## 2011-05-28 DIAGNOSIS — E785 Hyperlipidemia, unspecified: Secondary | ICD-10-CM | POA: Diagnosis present

## 2011-05-28 DIAGNOSIS — I509 Heart failure, unspecified: Secondary | ICD-10-CM | POA: Diagnosis present

## 2011-05-28 DIAGNOSIS — Z87891 Personal history of nicotine dependence: Secondary | ICD-10-CM

## 2011-05-28 DIAGNOSIS — E1169 Type 2 diabetes mellitus with other specified complication: Secondary | ICD-10-CM | POA: Diagnosis present

## 2011-05-28 DIAGNOSIS — I5032 Chronic diastolic (congestive) heart failure: Secondary | ICD-10-CM | POA: Diagnosis present

## 2011-05-28 DIAGNOSIS — E119 Type 2 diabetes mellitus without complications: Secondary | ICD-10-CM | POA: Diagnosis present

## 2011-05-28 DIAGNOSIS — K219 Gastro-esophageal reflux disease without esophagitis: Secondary | ICD-10-CM | POA: Diagnosis present

## 2011-05-28 DIAGNOSIS — R0609 Other forms of dyspnea: Secondary | ICD-10-CM

## 2011-05-28 DIAGNOSIS — E039 Hypothyroidism, unspecified: Secondary | ICD-10-CM | POA: Diagnosis present

## 2011-05-28 DIAGNOSIS — R079 Chest pain, unspecified: Secondary | ICD-10-CM

## 2011-05-28 HISTORY — DX: Heart failure, unspecified: I50.9

## 2011-05-28 HISTORY — DX: Type 2 diabetes mellitus without complications: E11.9

## 2011-05-28 HISTORY — DX: Unspecified osteoarthritis, unspecified site: M19.90

## 2011-05-28 LAB — DIFFERENTIAL
Basophils Absolute: 0 10*3/uL (ref 0.0–0.1)
Basophils Relative: 0 % (ref 0–1)
Eosinophils Absolute: 0.2 10*3/uL (ref 0.0–0.7)
Eosinophils Relative: 2 % (ref 0–5)
Lymphocytes Relative: 13 % (ref 12–46)
Lymphs Abs: 1.1 10*3/uL (ref 0.7–4.0)
Monocytes Absolute: 1.2 10*3/uL — ABNORMAL HIGH (ref 0.1–1.0)
Monocytes Relative: 14 % — ABNORMAL HIGH (ref 3–12)
Neutro Abs: 6 10*3/uL (ref 1.7–7.7)
Neutrophils Relative %: 71 % (ref 43–77)

## 2011-05-28 LAB — URINALYSIS, ROUTINE W REFLEX MICROSCOPIC
Bilirubin Urine: NEGATIVE
Glucose, UA: NEGATIVE mg/dL
Hgb urine dipstick: NEGATIVE
Ketones, ur: NEGATIVE mg/dL
Nitrite: NEGATIVE
Protein, ur: NEGATIVE mg/dL
Specific Gravity, Urine: 1.015 (ref 1.005–1.030)
Urobilinogen, UA: 1 mg/dL (ref 0.0–1.0)
pH: 5.5 (ref 5.0–8.0)

## 2011-05-28 LAB — CBC
HCT: 33.8 % — ABNORMAL LOW (ref 36.0–46.0)
Hemoglobin: 11.7 g/dL — ABNORMAL LOW (ref 12.0–15.0)
MCH: 29.6 pg (ref 26.0–34.0)
MCHC: 34.6 g/dL (ref 30.0–36.0)
MCV: 85.6 fL (ref 78.0–100.0)
Platelets: 180 10*3/uL (ref 150–400)
RBC: 3.95 MIL/uL (ref 3.87–5.11)
RDW: 14.2 % (ref 11.5–15.5)
WBC: 8.5 10*3/uL (ref 4.0–10.5)

## 2011-05-28 LAB — BASIC METABOLIC PANEL
BUN: 22 mg/dL (ref 6–23)
CO2: 25 mEq/L (ref 19–32)
Calcium: 10 mg/dL (ref 8.4–10.5)
Chloride: 107 mEq/L (ref 96–112)
Creatinine, Ser: 1.02 mg/dL (ref 0.50–1.10)
GFR calc Af Amer: 61 mL/min — ABNORMAL LOW (ref >90)
GFR calc non Af Amer: 53 mL/min — ABNORMAL LOW (ref >90)
Glucose, Bld: 159 mg/dL — ABNORMAL HIGH (ref 70–99)
Potassium: 4.3 mEq/L (ref 3.5–5.1)
Sodium: 143 mEq/L (ref 135–145)

## 2011-05-28 LAB — URINE MICROSCOPIC-ADD ON

## 2011-05-28 LAB — POCT I-STAT TROPONIN I: Troponin i, poc: 0 ng/mL (ref 0.00–0.08)

## 2011-05-28 LAB — PRO B NATRIURETIC PEPTIDE: Pro B Natriuretic peptide (BNP): 31.4 pg/mL (ref 0–125)

## 2011-05-28 LAB — GLUCOSE, CAPILLARY: Glucose-Capillary: 187 mg/dL — ABNORMAL HIGH (ref 70–99)

## 2011-05-28 LAB — HEPATIC FUNCTION PANEL
ALT: 7 U/L (ref 0–35)
AST: 11 U/L (ref 0–37)
Albumin: 3.4 g/dL — ABNORMAL LOW (ref 3.5–5.2)
Alkaline Phosphatase: 79 U/L (ref 39–117)
Bilirubin, Direct: 0.1 mg/dL (ref 0.0–0.3)
Indirect Bilirubin: 0.3 mg/dL (ref 0.3–0.9)
Total Bilirubin: 0.4 mg/dL (ref 0.3–1.2)
Total Protein: 6.9 g/dL (ref 6.0–8.3)

## 2011-05-28 MED ORDER — ALBUTEROL SULFATE (5 MG/ML) 0.5% IN NEBU: 2.5000 mg | INHALATION_SOLUTION | Freq: Four times a day (QID) | RESPIRATORY_TRACT | Status: DC

## 2011-05-28 MED ORDER — FUROSEMIDE 20 MG PO TABS
20.0000 mg | ORAL_TABLET | Freq: Every day | ORAL | Status: DC
  Administered 2011-05-29: 20 mg via ORAL
  Filled 2011-05-28 (×2): qty 1

## 2011-05-28 MED ORDER — ACETAMINOPHEN 650 MG RE SUPP: 650.0000 mg | Freq: Four times a day (QID) | RECTAL | Status: DC | PRN

## 2011-05-28 MED ORDER — PREDNISONE 20 MG PO TABS
40.0000 mg | ORAL_TABLET | Freq: Every day | ORAL | Status: DC
  Administered 2011-05-29: 40 mg via ORAL
  Filled 2011-05-28 (×2): qty 2

## 2011-05-28 MED ORDER — METFORMIN HCL 500 MG PO TABS
1000.0000 mg | ORAL_TABLET | Freq: Two times a day (BID) | ORAL | Status: DC
  Administered 2011-05-28 – 2011-05-29 (×3): 1000 mg via ORAL
  Filled 2011-05-28 (×4): qty 2

## 2011-05-28 MED ORDER — CARVEDILOL 25 MG PO TABS
25.0000 mg | ORAL_TABLET | Freq: Two times a day (BID) | ORAL | Status: DC
  Administered 2011-05-28 – 2011-05-29 (×3): 25 mg via ORAL
  Filled 2011-05-28 (×4): qty 1

## 2011-05-28 MED ORDER — ONDANSETRON HCL 4 MG PO TABS: 4.0000 mg | ORAL_TABLET | Freq: Four times a day (QID) | ORAL | Status: DC | PRN

## 2011-05-28 MED ORDER — IPRATROPIUM BROMIDE 0.02 % IN SOLN
0.5000 mg | Freq: Four times a day (QID) | RESPIRATORY_TRACT | Status: DC
Start: 2011-05-28 — End: 2011-05-28

## 2011-05-28 MED ORDER — ONDANSETRON HCL 4 MG/2ML IJ SOLN: 4.0000 mg | Freq: Four times a day (QID) | INTRAMUSCULAR | Status: DC | PRN

## 2011-05-28 MED ORDER — DOCUSATE SODIUM 100 MG PO CAPS
100.0000 mg | ORAL_CAPSULE | Freq: Two times a day (BID) | ORAL | Status: DC
  Administered 2011-05-28 – 2011-05-29 (×2): 100 mg via ORAL
  Filled 2011-05-28 (×3): qty 1

## 2011-05-28 MED ORDER — PANTOPRAZOLE SODIUM 40 MG PO TBEC
40.0000 mg | DELAYED_RELEASE_TABLET | Freq: Every day | ORAL | Status: DC
  Administered 2011-05-29: 40 mg via ORAL
  Filled 2011-05-28: qty 1

## 2011-05-28 MED ORDER — MORPHINE SULFATE 2 MG/ML IJ SOLN: 2.0000 mg | INTRAMUSCULAR | Status: DC | PRN

## 2011-05-28 MED ORDER — ACETAMINOPHEN 325 MG PO TABS
650.0000 mg | ORAL_TABLET | Freq: Once | ORAL | Status: AC
  Administered 2011-05-28: 650 mg via ORAL
  Filled 2011-05-28: qty 2

## 2011-05-28 MED ORDER — METFORMIN HCL 500 MG PO TABS: 1000.0000 mg | ORAL_TABLET | Freq: Two times a day (BID) | ORAL | Status: DC

## 2011-05-28 MED ORDER — LEVOTHYROXINE SODIUM 100 MCG PO TABS
100.0000 ug | ORAL_TABLET | Freq: Every day | ORAL | Status: DC
  Administered 2011-05-29: 100 ug via ORAL
  Filled 2011-05-28 (×2): qty 1

## 2011-05-28 MED ORDER — ENOXAPARIN SODIUM 40 MG/0.4ML ~~LOC~~ SOLN
40.0000 mg | SUBCUTANEOUS | Status: DC
  Administered 2011-05-28: 40 mg via SUBCUTANEOUS
  Filled 2011-05-28 (×2): qty 0.4

## 2011-05-28 MED ORDER — ALBUTEROL SULFATE (5 MG/ML) 0.5% IN NEBU: 2.5000 mg | INHALATION_SOLUTION | RESPIRATORY_TRACT | Status: DC | PRN

## 2011-05-28 MED ORDER — ALBUTEROL SULFATE (5 MG/ML) 0.5% IN NEBU
2.5000 mg | INHALATION_SOLUTION | Freq: Three times a day (TID) | RESPIRATORY_TRACT | Status: DC
  Administered 2011-05-28 – 2011-05-29 (×3): 2.5 mg via RESPIRATORY_TRACT
  Filled 2011-05-28 (×3): qty 0.5

## 2011-05-28 MED ORDER — ZOLPIDEM TARTRATE 10 MG PO TABS
5.0000 mg | ORAL_TABLET | Freq: Every evening | ORAL | Status: DC | PRN
  Administered 2011-05-28: 5 mg via ORAL
  Filled 2011-05-28 (×2): qty 1

## 2011-05-28 MED ORDER — IPRATROPIUM BROMIDE 0.02 % IN SOLN: 0.5000 mg | Freq: Four times a day (QID) | RESPIRATORY_TRACT | Status: DC

## 2011-05-28 MED ORDER — ALBUTEROL SULFATE (5 MG/ML) 0.5% IN NEBU: 2.5000 mg | INHALATION_SOLUTION | Freq: Three times a day (TID) | RESPIRATORY_TRACT | Status: DC

## 2011-05-28 MED ORDER — INSULIN ASPART 100 UNIT/ML ~~LOC~~ SOLN
0.0000 [IU] | Freq: Three times a day (TID) | SUBCUTANEOUS | Status: DC
  Administered 2011-05-29: 2 [IU] via SUBCUTANEOUS

## 2011-05-28 MED ORDER — AMLODIPINE BESYLATE 10 MG PO TABS
10.0000 mg | ORAL_TABLET | Freq: Every day | ORAL | Status: DC
  Administered 2011-05-28 – 2011-05-29 (×2): 10 mg via ORAL
  Filled 2011-05-28 (×2): qty 1

## 2011-05-28 MED ORDER — PAROXETINE HCL 20 MG PO TABS
20.0000 mg | ORAL_TABLET | Freq: Every day | ORAL | Status: DC
  Administered 2011-05-29: 20 mg via ORAL
  Filled 2011-05-28: qty 1

## 2011-05-28 MED ORDER — SODIUM CHLORIDE 0.9 % IV SOLN: INTRAVENOUS | Status: DC

## 2011-05-28 MED ORDER — CLOPIDOGREL BISULFATE 75 MG PO TABS
75.0000 mg | ORAL_TABLET | Freq: Every day | ORAL | Status: DC
  Administered 2011-05-29: 75 mg via ORAL
  Filled 2011-05-28 (×2): qty 1

## 2011-05-28 MED ORDER — ACETAMINOPHEN 325 MG PO TABS
650.0000 mg | ORAL_TABLET | Freq: Four times a day (QID) | ORAL | Status: DC | PRN
  Administered 2011-05-28: 650 mg via ORAL
  Filled 2011-05-28: qty 2

## 2011-05-28 MED ORDER — MOXIFLOXACIN HCL IN NACL 400 MG/250ML IV SOLN
400.0000 mg | INTRAVENOUS | Status: DC
  Administered 2011-05-28: 400 mg via INTRAVENOUS
  Filled 2011-05-28 (×2): qty 250

## 2011-05-28 MED ORDER — ATORVASTATIN CALCIUM 10 MG PO TABS
10.0000 mg | ORAL_TABLET | Freq: Every day | ORAL | Status: DC
  Filled 2011-05-28 (×2): qty 1

## 2011-05-28 MED ORDER — ONDANSETRON HCL 4 MG/2ML IJ SOLN: 4.0000 mg | Freq: Three times a day (TID) | INTRAMUSCULAR | Status: DC | PRN

## 2011-05-28 MED ORDER — IPRATROPIUM BROMIDE 0.02 % IN SOLN
0.5000 mg | Freq: Three times a day (TID) | RESPIRATORY_TRACT | Status: DC
  Administered 2011-05-28 – 2011-05-29 (×2): 0.5 mg via RESPIRATORY_TRACT
  Filled 2011-05-28 (×2): qty 2.5

## 2011-05-28 MED ORDER — SODIUM CHLORIDE 0.9 % IV SOLN
INTRAVENOUS | Status: DC
  Administered 2011-05-28: 20:00:00 via INTRAVENOUS

## 2011-05-28 MED ORDER — LISINOPRIL 5 MG PO TABS
5.0000 mg | ORAL_TABLET | Freq: Every day | ORAL | Status: DC
  Administered 2011-05-29: 5 mg via ORAL
  Filled 2011-05-28: qty 1

## 2011-05-28 MED ORDER — IPRATROPIUM BROMIDE 0.02 % IN SOLN: 0.5000 mg | RESPIRATORY_TRACT | Status: DC | PRN

## 2011-05-28 MED ORDER — SODIUM CHLORIDE 0.9 % IV BOLUS (SEPSIS)
500.0000 mL | Freq: Once | INTRAVENOUS | Status: AC
  Administered 2011-05-28: 500 mL via INTRAVENOUS
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
## 2011-05-28 NOTE — ED Notes (Signed)
Attempted to call report. RN unavailable at this time. Will call back in 10 minutes.  

## 2011-05-28 NOTE — ED Provider Notes (Signed)
I reviewed the EKG, discussed  The case with the resident, and agreed with the resident's plan for transfer.   Domenick Gong, MD  Luiz Blare, MD 05/28/11 1302

## 2011-05-28 NOTE — ED Notes (Signed)
MD at bedside. 

## 2011-05-28 NOTE — ED Notes (Signed)
Reports shortness of breath, onset one week ago.  Patient reports uri symptoms prior to sob.  Patient reports she was getting better then getting worse.  Patient reports sob with exertion.  Reports chills
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
## 2011-05-28 NOTE — ED Notes (Signed)
Pt presents with 2 week h/o productive cough and shortness of breath.  Pt reports she was seen at Urgent Care, given "a few pills" that helped initially, but not anymore. Pt reports 1 week h/o extreme fatigue, especially with exertion.  Pt reports anytime that she exerts herself, she gets palpitations with dizziness and near syncope.  Pt reports chest pressure that is intermittent "for a while".

## 2011-05-28 NOTE — ED Notes (Signed)
Pt reports having generalized weakness and fatigue x 1 week.  Denies nausea, CP.  Reports that she went to [facility] and was sent here for further eval.  Pt very vague.  States that she had the same thing on April 9th and was told that she had fluid in her lungs.  Was given medication but is not taking it anymore.  BS clear, no edema noted in legs.

## 2011-05-29 DIAGNOSIS — R05 Cough: Secondary | ICD-10-CM

## 2011-05-29 DIAGNOSIS — R059 Cough, unspecified: Secondary | ICD-10-CM

## 2011-05-29 DIAGNOSIS — R0602 Shortness of breath: Secondary | ICD-10-CM

## 2011-05-29 LAB — GLUCOSE, CAPILLARY
Glucose-Capillary: 104 mg/dL — ABNORMAL HIGH (ref 70–99)
Glucose-Capillary: 180 mg/dL — ABNORMAL HIGH (ref 70–99)

## 2011-05-29 LAB — CBC
HCT: 31.6 % — ABNORMAL LOW (ref 36.0–46.0)
Hemoglobin: 10.7 g/dL — ABNORMAL LOW (ref 12.0–15.0)
MCH: 29.2 pg (ref 26.0–34.0)
MCHC: 33.9 g/dL (ref 30.0–36.0)
MCV: 86.1 fL (ref 78.0–100.0)
Platelets: 165 10*3/uL (ref 150–400)
RBC: 3.67 MIL/uL — ABNORMAL LOW (ref 3.87–5.11)
RDW: 14.2 % (ref 11.5–15.5)
WBC: 6.9 10*3/uL (ref 4.0–10.5)

## 2011-05-29 LAB — BASIC METABOLIC PANEL
BUN: 16 mg/dL (ref 6–23)
CO2: 23 mEq/L (ref 19–32)
Calcium: 9.2 mg/dL (ref 8.4–10.5)
Chloride: 111 mEq/L (ref 96–112)
Creatinine, Ser: 0.94 mg/dL (ref 0.50–1.10)
GFR calc Af Amer: 68 mL/min — ABNORMAL LOW (ref >90)
GFR calc non Af Amer: 58 mL/min — ABNORMAL LOW (ref >90)
Glucose, Bld: 111 mg/dL — ABNORMAL HIGH (ref 70–99)
Potassium: 4.2 mEq/L (ref 3.5–5.1)
Sodium: 143 mEq/L (ref 135–145)

## 2011-05-29 LAB — TSH: TSH: 0.831 u[IU]/mL (ref 0.350–4.500)

## 2011-05-29 MED ORDER — TIOTROPIUM BROMIDE MONOHYDRATE 18 MCG IN CAPS: 18.0000 ug | ORAL_CAPSULE | Freq: Every day | RESPIRATORY_TRACT | Status: DC

## 2011-05-29 MED ORDER — MOXIFLOXACIN HCL 400 MG PO TABS: 400.0000 mg | ORAL_TABLET | Freq: Every day | ORAL | Status: AC

## 2011-05-29 MED ORDER — PREDNISONE 10 MG PO TABS: ORAL_TABLET | ORAL | Status: DC

## 2011-05-29 MED ORDER — TIOTROPIUM BROMIDE MONOHYDRATE 18 MCG IN CAPS
18.0000 ug | ORAL_CAPSULE | Freq: Every day | RESPIRATORY_TRACT | Status: DC
  Administered 2011-05-29: 18 ug via RESPIRATORY_TRACT
  Filled 2011-05-29: qty 5

## 2011-05-29 MED ORDER — MOXIFLOXACIN HCL 400 MG PO TABS: 400.0000 mg | ORAL_TABLET | Freq: Every day | ORAL | Status: DC

## 2011-05-29 MED ORDER — MOXIFLOXACIN HCL 400 MG PO TABS
400.0000 mg | ORAL_TABLET | Freq: Every day | ORAL | Status: DC
  Filled 2011-05-29: qty 1

## 2011-05-29 NOTE — Progress Notes (Addendum)
Order for HHPT, [facility] will provide HHPT  . Johny Shock RN MPH [phone]     CARE MANAGEMENT NOTE 05/29/2011  Patient:  Caitlyn Aguirre, Caitlyn Aguirre   Account Number:  [id number]  Date Initiated:  05/29/2011  Documentation initiated by:  Ronny Flurry  Subjective/Objective Assessment:   DX: COPD     Action/Plan:   HHPT , pt selected [facility].   Anticipated DC Date:  05/29/2011   Anticipated DC Plan:  HOME W HOME HEALTH SERVICES         Choice offered to / List presented to:          [facility] arranged  HH-2 PT      [facility] agency  [facility]   Status of service:  Completed, signed off Medicare Important Message given?   (If response is "NO", the following Medicare IM given date fields will be blank) Date Medicare IM given:   Date Additional Medicare IM given:    Discharge Disposition:  HOME W HOME HEALTH SERVICES  Per UR Regulation:  Reviewed for med. necessity/level of care/duration of stay  If discussed at Long Length of Stay Meetings, dates discussed:    Comments:

## 2011-05-29 NOTE — Discharge Summary (Signed)
Internal Medicine Teaching [facility] Discharge Note  Name: Caitlyn Aguirre MRN: [id number] DOB: 1935/06/22 76 y.o.  Date of Admission: 05/28/2011 11:07 AM Date of Discharge: 05/29/2011 Attending Physician: Burns Spain, MD  Discharge Diagnosis: 1. Acute Exacerbation COPD - treated with prednisone, moxifloxacin, started Spiriva 2. Diastolic Congestive Heart Failure - stable 3. Hyperlipidemia 4. Hypothyroidism 5. Diabetes Mellitus 6. GERD  Discharge Medications: Medication List  As of 05/29/2011  3:45 PM   TAKE these medications         albuterol 108 (90 BASE) MCG/ACT inhaler   Commonly known as: PROVENTIL HFA;VENTOLIN HFA   Inhale 2 puffs into the lungs every 6 (six) hours as needed. For wheezing      amLODipine 10 MG tablet   Commonly known as: NORVASC   Take 10 mg by mouth daily.      carvedilol 25 MG tablet   Commonly known as: COREG   Take 25 mg by mouth 2 (two) times daily with a meal.      clopidogrel 75 MG tablet   Commonly known as: PLAVIX   Take 75 mg by mouth daily.      cyclobenzaprine 5 MG tablet   Commonly known as: FLEXERIL   Take 7.5 mg by mouth 3 (three) times daily as needed. For muscle spasms      docusate sodium 100 MG capsule   Commonly known as: COLACE   Take 100 mg by mouth 2 (two) times daily.      fluticasone 110 MCG/ACT inhaler   Commonly known as: FLOVENT HFA   Inhale 1 puff into the lungs 2 (two) times daily.      furosemide 20 MG tablet   Commonly known as: LASIX   Take 20 mg by mouth daily.      glipiZIDE 5 MG 24 hr tablet   Commonly known as: GLUCOTROL XL   Take 5 mg by mouth daily.      levothyroxine 100 MCG tablet   Commonly known as: SYNTHROID, LEVOTHROID   Take 100 mcg by mouth daily.      lisinopril 5 MG tablet   Commonly known as: PRINIVIL,ZESTRIL   Take 5 mg by mouth daily.      metFORMIN 1000 MG tablet   Commonly known as: GLUCOPHAGE   Take 1,000 mg by mouth 2 (two) times daily with a meal.     moxifloxacin 400 MG tablet   Commonly known as: AVELOX   Take 1 tablet (400 mg total) by mouth daily at 6 PM.      PARoxetine 20 MG tablet   Commonly known as: PAXIL   Take 20 mg by mouth every morning.      potassium chloride SA 20 MEQ tablet   Commonly known as: K-DUR,KLOR-CON   Take 20 mEq by mouth daily.      predniSONE 10 MG tablet   Commonly known as: DELTASONE   Take 4 tablets per day for the next 3 days, then 2 tablets per day for 3 days, then 1 tablet per day for 2 days, then stop      ranitidine 150 MG tablet   Commonly known as: ZANTAC   Take 150 mg by mouth daily.      simvastatin 20 MG tablet   Commonly known as: ZOCOR   Take 20 mg by mouth every evening.      tiotropium 18 MCG inhalation capsule   Commonly known as: SPIRIVA   Place 1 capsule (18 mcg total) into  inhaler and inhale daily.      traMADol 50 MG tablet   Commonly known as: ULTRAM   Take 50 mg by mouth every 8 (eight) hours as needed. For pain      zolpidem 10 MG tablet   Commonly known as: AMBIEN   Take 10 mg by mouth at bedtime as needed. For sleep            Disposition and follow-up:   Caitlyn Aguirre was discharged from [facility] in stable and improved condition, with improvement in cough and shortness of breath.  The patient was discharged with home PT for further strengthening.  The patient will follow-up with Dr. Allena Katz on 06/17/11 for the following issues: 1. Give referral for outpatient PFT's, to formally diagnose COPD 2. Ensure compliance with new Spiriva inhaler  Follow-up Appointments: Discharge Orders    Future Appointments: Provider: Department: Dept Phone: Center:   06/17/2011 1:15 PM Sunday Spillers, MD [facility] [phone] [facility]     Future Orders Please Complete By Expires   Diet - low sodium heart healthy      Increase activity slowly      Discharge instructions      Comments:   You were hospitalized for a COPD exacerbation.  To complete  treatment for this condition, take the following steps: 1. Take Prednisone (a steroid), 4 tablets per day for 3 days, followed by 2 tablets per day for 3 days, followed by 1 tablet per day for 2 days.  Start this course tomorrow (Saturday, 05/30/11) 2. Take Avelox (Moxifloxacin), an antibiotic, once per day for 5 days, starting on Saturday 05/30/11 3. Start using an inhaler called Spiriva once per day EVERY DAY to improve your lungs in the long term. 4. Continue using your Albuterol or "rescue" inhaler up to 2 puffs every four hours as needed for wheezing and shortness of breath.  You may find that you need to use this inhaler more over the next few days, as your lungs heal.   Call MD for:  temperature >100.4      Call MD for:  difficulty breathing, headache or visual disturbances      Call MD for:  persistant dizziness or light-headedness         Consultations: None  Procedures Performed:  X-ray Chest Pa And Lateral   05/28/2011  *RADIOLOGY REPORT*  Clinical Data: Chest pain, weakness and shortness of breath.  CHEST - 2 VIEW  Comparison: Chest x-ray 05/28/2011.  Findings: Lung volumes are normal.  There are bibasilar opacities (left greater than right) which may reflect areas of atelectasis and/or consolidation.  Mild blunting of the costophrenic sulci is noted on the lateral projection, which may suggest the presence of trace bilateral pleural effusions.  Mild cephalization of the pulmonary vasculature without frank pulmonary edema.  Heart size is borderline enlarged.  Atherosclerotic calcifications are noted within the arch of the aorta. Lateral projection demonstrates flowing anterior osteophytes throughout the thoracic spine compatible with diffuse idiopathic hyperostosis (DISH).  IMPRESSION: 1.  Ill-defined bibasilar opacities may reflect areas of atelectasis and/or consolidation.  Findings may reflect sequela of recent aspiration. 2.  Atherosclerosis. 3.  DISH.  Original Report Authenticated By:  Florencia Reasons, M.D.   Dg Chest 2 View  05/11/2011  *RADIOLOGY REPORT*  Clinical Data: Cough, shortness of breath  CHEST - 2 VIEW  Comparison: 04/03/2011  Findings: Stable mild cardiomegaly with central vascular congestion.  Bibasilar atelectasis / scarring.  No effusion or pneumothorax.  Trachea midline.  Degenerative changes of the spine. Stable chronic basilar interstitial changes versus fibrosis. Healed lateral left rib fractures.  IMPRESSION: Stable chronic findings as described.  No superimposed acute process.  Original Report Authenticated By: Judie Petit. Ruel Favors, M.D.   Dg Chest Port 1 View  05/28/2011  *RADIOLOGY REPORT*  Clinical Data: 76 year old female with chest pressure, shortness of breath, diabetes.  PORTABLE CHEST - 1 VIEW  Comparison: 05/11/2011 and earlier.  Findings: AP portable seated upright view 1220 hours.  Stable cardiomegaly and prominence of central pulmonary vasculature. Stable tortuous thoracic aorta.  No pneumothorax, or pulmonary edema.  Chronic left basilar reticulonodular opacity not significantly changed.  No definite effusion or acute airspace disease.  IMPRESSION: Stable chronic-appearing left base reticulonodular density. Suspect chronic central pulmonary vascular enlargement such as due to pulmonary artery hypertension. No superimposed acute findings are identified.  Original Report Authenticated By: Harley Hallmark, M.D.    Admission HPI:  The patient is a 76 yo woman, history of COPD, HTN, DM, hypothyroidism, CHF, presenting with shortness of breath. Three weeks ago, the patient started developing symptoms of shortness of breath, as well as cough productive of yellow sputum. She presented to the ED on 05/11/11, and was diagnosed with bronchitis, and discharged with a 5-day course of azithromycin. The patient notes some improvement in cough with antibiotics, but return of symptoms about 1 week ago, along with associated fatigue, subjective fevers, and sweating. No  rhinorrhea, sore throat, cp, LE edema, or PND. She notes increased albuterol usage during this time, up to 4x/day. The patient has a history of smoking, but quit 27 years ago. She has COPD on her problem list but no PFT's on record, no home oxygen, no prior intubation for COPD (1 prior intubation for a reported MI), and no other inhaler usage.  Admission Physical Exam Blood pressure 108/65, pulse 80, temperature 98.6 F (37 C), temperature source Oral, resp. rate 20, SpO2 97.00%.  General: alert, cooperative, and in no apparent distress HEENT: pupils equal round and reactive to light, vision grossly intact, oropharynx clear and non-erythematous  Neck: supple, no lymphadenopathy, no JVD Lungs: bilateral crackles heard at lung bases, mildly increased work of respiration, no wheezes noted Heart: regular rate and rhythm, no murmurs, gallops, or rubs Abdomen: soft, non-tender, non-distended, normal bowel sounds  Extremities: no cyanosis, clubbing, or edema Neurologic: alert & oriented X3, cranial nerves II-XII intact, strength grossly intact, sensation intact to light touch  Admission Labs Basic Metabolic Panel:  Basename  05/28/11 1230   NA  143   K  4.3   CL  107   CO2  25   GLUCOSE  159*   BUN  22   CREATININE  1.02   CALCIUM  10.0   MG  --   PHOS  --    CBC:  Basename  05/28/11 1230   WBC  8.5   NEUTROABS  6.0   HGB  11.7*   HCT  33.8*   MCV  85.6   PLT  180    BNP:  Basename  05/28/11 1514   PROBNP  31.4     Hospital Course by problem list: 1. Acute Exacerbation COPD - The patient presented with increased cough frequency, sputum production, and sputum purulence, consistent with COPD exacerbation.  Interestingly, the patient has a history of tobacco usage, and COPD noted on clinic visit notes for several years, but no prior hospitalization for COPD exacerbation that we could  find, and no formal PFT's.  However, given symptom presentation, the patient was treated with  duonebs, prednisone, and moxifloxacin (doxycycline inpatient shortage at the time), with significant improvement in symptoms within 24 hours, with decreased cough and SOB, and normal oxygen saturations.  The patient was discharged to complete a prednisone taper over 8 days, and to complete moxifloxacin treatment.  The patient previously had only used albuterol, but was started on Spiriva during this hospitalization, and prescribed this medication at discharge.  2. Diastolic Congestive Heart Failure - The patient has a history of CHF (EF = 55%, grade 1 diastolic dysfunction).  Her pro-BNP was normal, and she had no symptoms of CHF during this hospitalization.  3. Hypothyroidism - the patient was continued on synthroid during hospitalization and at discharge.  4. Diabetes Mellitus - the patient has a history of DM, with Hb A1C of 6.5.  She was discharged on her home metoprolol and glipizide.  Time spent on discharge: 45 minutes  Discharge Vitals:  BP 110/54  Pulse 69  Temp(Src) 97.9 F (36.6 C) (Oral)  Resp 16  SpO2 93%  Discharge Labs:  Results for orders placed during the hospital encounter of 05/28/11 (from the past 24 hour(s))  HEPATIC FUNCTION PANEL     Status: Abnormal   Collection Time   05/28/11  7:40 PM      Component Value Range   Total Protein 6.9  6.0 - 8.3 (g/dL)   Albumin 3.4 (*) 3.5 - 5.2 (g/dL)   AST 11  0 - 37 (U/L)   ALT 7  0 - 35 (U/L)   Alkaline Phosphatase 79  39 - 117 (U/L)   Total Bilirubin 0.4  0.3 - 1.2 (mg/dL)   Bilirubin, Direct 0.1  0.0 - 0.3 (mg/dL)   Indirect Bilirubin 0.3  0.3 - 0.9 (mg/dL)  TSH     Status: Normal   Collection Time   05/28/11  7:40 PM      Component Value Range   TSH 0.831  0.350 - 4.500 (uIU/mL)  GLUCOSE, CAPILLARY     Status: Abnormal   Collection Time   05/28/11  9:46 PM      Component Value Range   Glucose-Capillary 187 (*) 70 - 99 (mg/dL)   Comment 1 Notify RN    BASIC METABOLIC PANEL     Status: Abnormal   Collection Time    05/29/11  6:25 AM      Component Value Range   Sodium 143  135 - 145 (mEq/L)   Potassium 4.2  3.5 - 5.1 (mEq/L)   Chloride 111  96 - 112 (mEq/L)   CO2 23  19 - 32 (mEq/L)   Glucose, Bld 111 (*) 70 - 99 (mg/dL)   BUN 16  6 - 23 (mg/dL)   Creatinine, Ser 4.09  0.50 - 1.10 (mg/dL)   Calcium 9.2  8.4 - 81.1 (mg/dL)   GFR calc non Af Amer 58 (*) >90 (mL/min)   GFR calc Af Amer 68 (*) >90 (mL/min)  CBC     Status: Abnormal   Collection Time   05/29/11  6:25 AM      Component Value Range   WBC 6.9  4.0 - 10.5 (K/uL)   RBC 3.67 (*) 3.87 - 5.11 (MIL/uL)   Hemoglobin 10.7 (*) 12.0 - 15.0 (g/dL)   HCT 91.4 (*) 78.2 - 46.0 (%)   MCV 86.1  78.0 - 100.0 (fL)   MCH 29.2  26.0 - 34.0 (pg)   MCHC  33.9  30.0 - 36.0 (g/dL)   RDW 11.9  14.7 - 82.9 (%)   Platelets 165  150 - 400 (K/uL)  GLUCOSE, CAPILLARY     Status: Abnormal   Collection Time   05/29/11  6:39 AM      Component Value Range   Glucose-Capillary 104 (*) 70 - 99 (mg/dL)   Comment 1 Notify RN    GLUCOSE, CAPILLARY     Status: Abnormal   Collection Time   05/29/11 11:00 AM      Component Value Range   Glucose-Capillary 180 (*) 70 - 99 (mg/dL)   Comment 1 Documented in Chart     Comment 2 Notify RN      Signed: Janalyn Harder 05/29/2011, 3:45 PM

## 2011-05-29 NOTE — H&P (Signed)
Internal Medicine Teaching Service Attending Note Date: 05/29/2011  Patient name: Caitlyn Aguirre  Medical record number: [id number]  Date of birth: Oct 23, 1935   I have seen and evaluated Caitlyn Aguirre and discussed their care with the Residency Team. Please see Dr Theora Gianotti H&P for full details. Caitlyn Aguirre is a 76 yo pt of Dr Scot Dock admitted with productive cough and dyspnea who failed outpt treatment with ABX. She has a dx of COPD but no known PFT's and just uses alb daily when she gets dyspneic, usually with exertion. She has never had a COPD exac previously. Flovent is on her med list but pt denies using it.  Pt was started in ABX, steroids, and nebs and feels almost back to baseline now. PT & OT have eval the pt and rec home PT.   On exam, she is sitting in a chair and able to speak in full sentences.  HRRR L good air movement, crackles B Ext no edema Neuro : no focal  Assessment and Plan: I agree with the formulated Assessment and Plan with the following changes:  1. Presumptive COPD exac - pt responded quickly to ABX, steroids and nebs. Stable for D/C home 2. Presumptive COPD - will need PFT's once stable. D/C on spiriva and alb prn 3. Weakness - Home PT.

## 2011-05-29 NOTE — Discharge Instructions (Signed)

## 2011-05-29 NOTE — Evaluation (Signed)
Physical Therapy Evaluation Patient Details Name: Caitlyn Aguirre MRN: [id number] DOB: 13-Apr-1935 Today's Date: 05/29/2011 Time: 4098-1191 PT Time Calculation (min): 24 min  PT Assessment / Plan / Recommendation Clinical Impression  Patient admitted with COPD with decr mobility secondary to DOE and poor endurance for activity.  Will need HHPT f/u.      PT Assessment  Patient needs continued PT services    Follow Up Recommendations  Home health PT    Equipment Recommendations  None recommended by PT    Frequency Min 3X/week    Precautions / Restrictions Precautions Precautions: None Restrictions Weight Bearing Restrictions: No   Pertinent Vitals/Pain VSS/ No pain      Mobility  Bed Mobility Bed Mobility: Rolling Left;Left Sidelying to Sit Rolling Left: 4: Min guard Left Sidelying to Sit: 4: Min guard Details for Bed Mobility Assistance: cues for technique, see OT note for donning socks and gown. Transfers Transfers: Sit to Stand;Stand to Sit Sit to Stand: 5: Supervision;With upper extremity assist;From bed Stand to Sit: 5: Supervision;With upper extremity assist;With armrests;To chair/3-in-1 Details for Transfer Assistance: Patient needed cues for hand placement, slightly unsteady upon initial stand. Ambulation/Gait Ambulation/Gait Assistance: 4: Min guard Ambulation Distance (Feet): 165 Feet Assistive device: None Ambulation/Gait Assistance Details: Patient overall fairly steady with gait.  No LOB noted with minimal challenges.  Needed cues for pursed lip breathing as patient DOE 3/4 and sats as low as 87% on RA with ambulation.  Left O2 off upon return to room secondary to O2 back up to 94 % at rest.  Nursing aware.  Gait Pattern: Decreased stride length;Step-through pattern Stairs: Yes Stairs Assistance: 6: Modified independent (Device/Increase time) Stairs Assistance Details (indicate cue type and reason): No assistance or cues needed.   Stair Management Technique:  Two rails;Forwards Number of Stairs: 4  Wheelchair Mobility Wheelchair Mobility: No         PT Goals Acute Rehab PT Goals PT Goal Formulation: With patient Time For Goal Achievement: 06/05/11 Potential to Achieve Goals: Good Pt will go Supine/Side to Sit: Independently PT Goal: Supine/Side to Sit - Progress: Goal set today Pt will go Sit to Supine/Side: Independently PT Goal: Sit to Supine/Side - Progress: Goal set today Pt will go Sit to Stand: Independently PT Goal: Sit to Stand - Progress: Goal set today Pt will go Stand to Sit: Independently PT Goal: Stand to Sit - Progress: Goal set today Pt will Ambulate: >150 feet;with least restrictive assistive device;with modified independence PT Goal: Ambulate - Progress: Goal set today Pt will Perform Home Exercise Program: Independently PT Goal: Perform Home Exercise Program - Progress: Goal set today  Visit Information  Last PT Received On: 05/29/11 Assistance Needed: +1 PT/OT Co-Evaluation/Treatment: Yes    Subjective Data  Subjective: "I feel weak." Patient Stated Goal: To go home   Prior Functioning  Home Living Lives With: Daughter Available Help at Discharge: Family;Friend(s);Available PRN/intermittently Type of Home: House Home Access: Stairs to enter [employer] of Steps: 3 Entrance Stairs-Rails: None Home Layout: One level Bathroom Shower/Tub: Forensic scientist: Standard Bathroom Accessibility: Yes How Accessible: Accessible via walker Home Adaptive Equipment: Shower chair with back;Raised toilet seat with rails Prior Function Level of Independence: Independent Able to Take Stairs?: Yes Driving: Yes Vocation: Retired Musician: No difficulties    Cognition  Overall Cognitive Status: Appears within functional limits for tasks assessed/performed Arousal/Alertness: Awake/alert Orientation Level: Appears intact for tasks assessed Behavior During Session:  [facility] for tasks performed  Extremity/Trunk Assessment Right Upper Extremity Assessment RUE ROM/Strength/Tone: Within functional levels RUE Sensation: WFL - Light Touch RUE Coordination: WFL - gross/fine motor Left Upper Extremity Assessment LUE ROM/Strength/Tone: Within functional levels LUE Sensation: WFL - Light Touch LUE Coordination: WFL - gross/fine motor Right Lower Extremity Assessment RLE ROM/Strength/Tone: Within functional levels RLE Sensation: WFL - Light Touch RLE Coordination: WFL - gross/fine motor Left Lower Extremity Assessment LLE ROM/Strength/Tone: Within functional levels LLE Sensation: WFL - Light Touch LLE Coordination: WFL - gross/fine motor Trunk Assessment Trunk Assessment: Normal   Balance Balance Balance Assessed: Yes Dynamic Standing Balance Dynamic Standing - Balance Support: No upper extremity supported;During functional activity Dynamic Standing - Level of Assistance: 6: Modified independent (Device/Increase time) Dynamic Standing - Balance Activities: Lateral lean/weight shifting;Reaching across midline High Level Balance High Level Balance Activites: Direction changes;Turns;Sudden stops  End of Session PT - End of Session Equipment Utilized During Treatment: Gait belt Activity Tolerance: Patient tolerated treatment well Patient left: in chair;with call bell/phone within reach;with family/visitor present Nurse Communication: Mobility status   INGOLD,Ana Woodroof 05/29/2011, 11:17 AM  [facility] [phone] (pager)

## 2011-05-29 NOTE — Evaluation (Signed)
Occupational Therapy Evaluation Patient Details Name: Caitlyn Aguirre MRN: [id number] DOB: [birth year] Today's Date: 05/29/2011 Time: 4098-1191 OT Time Calculation (min): 24 min  OT Assessment / Plan / Recommendation Clinical Impression  Pt. presents with SOB and history of COPD. Pt. will benefit from skilled OT to increase functional independence with ADLs to mod I level at D/C home.    OT Assessment  Patient needs continued OT Services    Follow Up Recommendations  No OT follow up;Supervision - Intermittent    Equipment Recommendations  None recommended by OT    Frequency Min 2X/week    Precautions / Restrictions Precautions Precautions: None Restrictions Weight Bearing Restrictions: No   Pertinent Vitals/Pain Stable throughout    ADL  Eating/Feeding: Simulated;Independent Where Assessed - Eating/Feeding: Chair Grooming: Simulated;Wash/dry hands;Set up;Other (comment) (min guard assist) Where Assessed - Grooming: Standing at sink Upper Body Bathing: Simulated;Chest;Right arm;Left arm;Abdomen;Set up Where Assessed - Upper Body Bathing: Sitting, chair Lower Body Bathing: Simulated;Minimal assistance Where Assessed - Lower Body Bathing: Sit to stand from chair Upper Body Dressing: Performed;Minimal assistance Where Assessed - Upper Body Dressing: Sitting, chair Lower Body Dressing: Simulated;Minimal assistance Where Assessed - Lower Body Dressing: Sit to stand from bed Toilet Transfer: Simulated;Minimal assistance Toilet Transfer Method: Ambulating Toilet Transfer Equipment: Other (comment) Nurse, children's) Toileting - Clothing Manipulation: Simulated;Minimal assistance Where Assessed - Toileting Clothing Manipulation: Sit to stand from 3-in-1 or toilet Toileting - Hygiene: Simulated;Minimal assistance Where Assessed - Toileting Hygiene: Sit to stand from 3-in-1 or toilet Tub/Shower Transfer: Simulated;Minimal assistance Tub/Shower Transfer Method: Engineer, materials: Shower seat with back Ambulation Related to ADLs: Pt. min assist-min guard assist for ~100' due to pt. with SOB  ADL Comments: Pt. educated on energy conservation techniques to use with ADLs to increase activity tolerance due to pt. with SOB and decreased endurance during mobility and ADLs. Pt. reports having family/friends with her throughout most of the day and is rarely alone.     OT Goals Acute Rehab OT Goals OT Goal Formulation: With patient Time For Goal Achievement: 06/05/11 Potential to Achieve Goals: Good ADL Goals Pt Will Perform Lower Body Bathing: with modified independence;Sit to stand from chair;with adaptive equipment ADL Goal: Lower Body Bathing - Progress: Goal set today Pt Will Perform Lower Body Dressing: with modified independence;Sit to stand from chair;with adaptive equipment ADL Goal: Lower Body Dressing - Progress: Goal set today Pt Will Transfer to Toilet: with modified independence;with DME;3-in-1 ADL Goal: Toilet Transfer - Progress: Goal set today Pt Will Perform Tub/Shower Transfer: Tub transfer;with modified independence;Ambulation;Shower seat with back ADL Goal: Web designer - Progress: Goal set today Additional ADL Goal #1: Pt. will recall 3 energy conservation techniques with ADLs  ADL Goal: Additional Goal #1 - Progress: Goal set today  Visit Information  Last OT Received On: 05/29/11 Assistance Needed: +1 PT/OT Co-Evaluation/Treatment: Yes    Subjective Data  Subjective: "I am feeling pretty tired" Patient Stated Goal: Return home   Prior Functioning  Home Living Lives With: Daughter Available Help at Discharge: Family;Friend(s);Available PRN/intermittently Type of Home: House Home Access: Stairs to enter [employer] of Steps: 3 Entrance Stairs-Rails: None Home Layout: One level Bathroom Shower/Tub: Forensic scientist: Standard Bathroom Accessibility: Yes How Accessible: Accessible  via walker Home Adaptive Equipment: Shower chair with back;Raised toilet seat with rails Prior Function Level of Independence: Independent Able to Take Stairs?: Yes Driving: Yes Vocation: Retired Musician: No difficulties    Cognition  Overall Cognitive Status: Appears within functional  limits for tasks assessed/performed Arousal/Alertness: Awake/alert Orientation Level: Appears intact for tasks assessed Behavior During Session: [facility] for tasks performed    Extremity/Trunk Assessment Right Upper Extremity Assessment RUE ROM/Strength/Tone: Within functional levels RUE Sensation: WFL - Light Touch RUE Coordination: WFL - gross/fine motor Left Upper Extremity Assessment LUE ROM/Strength/Tone: Within functional levels LUE Sensation: WFL - Light Touch LUE Coordination: WFL - gross/fine motor Right Lower Extremity Assessment RLE ROM/Strength/Tone: Within functional levels RLE Sensation: WFL - Light Touch RLE Coordination: WFL - gross/fine motor Left Lower Extremity Assessment LLE ROM/Strength/Tone: Within functional levels LLE Sensation: WFL - Light Touch LLE Coordination: WFL - gross/fine motor Trunk Assessment Trunk Assessment: Normal   Mobility Bed Mobility Bed Mobility: Rolling Left;Left Sidelying to Sit Rolling Left: 4: Min guard Left Sidelying to Sit: 4: Min guard Details for Bed Mobility Assistance: cues for technique, see OT note for donning socks and gown. Transfers Transfers: Sit to Stand;Stand to Sit Sit to Stand: 5: Supervision;With upper extremity assist;From bed Stand to Sit: 5: Supervision;With upper extremity assist;With armrests;To chair/3-in-1 Details for Transfer Assistance: Patient needed cues for hand placement, slightly unsteady upon initial stand.          End of Session OT - End of Session Equipment Utilized During Treatment: Gait belt Activity Tolerance: Patient tolerated treatment well Patient left: in chair;with call bell/phone  within reach Nurse Communication: Mobility status   Cassandria Anger, OTR/L Pager [phone] 05/29/2011, 12:18 PM

## 2011-05-29 NOTE — Progress Notes (Addendum)
Subjective: No acute events overnight.  The patient feels significantly better this morning, with less SOB, cough, and weakness.  PT has recommended home health PT for further strengthening.  CXR this morning unremarkable.  Objective: Vital signs in last 24 hours: Filed Vitals:   05/28/11 1627 05/28/11 1732 05/28/11 2123 05/29/11 0534  BP: 108/65 132/71 115/51 106/59  Pulse: 80 84 82 69  Temp: 98.6 F (37 C) 98.3 F (36.8 C) 98.9 F (37.2 C) 99.1 F (37.3 C)  TempSrc: Oral Oral    Resp: 20 20 18 18   SpO2: 97% 93% 98% 95%   Weight change:   Intake/Output Summary (Last 24 hours) at 05/29/11 1131 Last data filed at 05/29/11 0600  Gross per 24 hour  Intake 1472.5 ml  Output      0 ml  Net 1472.5 ml   Physical Exam: General: alert, cooperative, and in no apparent distress HEENT: pupils equal round and reactive to light, vision grossly intact, oropharynx clear and non-erythematous  Neck: supple, no lymphadenopathy, no JVD Lungs: clear to ascultation bilaterally, normal work of respiration, no wheezes, rales, or crackles Heart: regular rate and rhythm, no murmurs, gallops, or rubs Abdomen: soft, non-tender, non-distended, normal bowel sounds  Extremities: no cyanosis, clubbing, or edema Neurologic: alert & oriented X3, cranial nerves II-XII intact, strength grossly intact, sensation intact to light touch  Lab Results: Basic Metabolic Panel:  Lab 05/29/11 1610 05/28/11 1230  NA 143 143  K 4.2 4.3  CL 111 107  CO2 23 25  GLUCOSE 111* 159*  BUN 16 22  CREATININE 0.94 1.02  CALCIUM 9.2 10.0  MG -- --  PHOS -- --   Liver Function Tests:  Lab 05/28/11 1940  AST 11  ALT 7  ALKPHOS 79  BILITOT 0.4  PROT 6.9  ALBUMIN 3.4*   CBC:  Lab 05/29/11 0625 05/28/11 1230  WBC 6.9 8.5  NEUTROABS -- 6.0  HGB 10.7* 11.7*  HCT 31.6* 33.8*  MCV 86.1 85.6  PLT 165 180   BNP:  Lab 05/28/11 1514  PROBNP 31.4   CBG:  Lab 05/29/11 1100 05/29/11 0639 05/28/11 2146  GLUCAP  180* 104* 187*   Thyroid Function Tests:  Lab 05/28/11 1940  TSH 0.831  T4TOTAL --  FREET4 --  T3FREE --  THYROIDAB --   Urinalysis:  Lab 05/28/11 1245  COLORURINE YELLOW  LABSPEC 1.015  PHURINE 5.5  GLUCOSEU NEGATIVE  HGBUR NEGATIVE  BILIRUBINUR NEGATIVE  KETONESUR NEGATIVE  PROTEINUR NEGATIVE  UROBILINOGEN 1.0  NITRITE NEGATIVE  LEUKOCYTESUR SMALL*    Studies/Results: X-ray Chest Pa And Lateral   05/28/2011  *RADIOLOGY REPORT*  Clinical Data: Chest pain, weakness and shortness of breath.  CHEST - 2 VIEW  Comparison: Chest x-ray 05/28/2011.  Findings: Lung volumes are normal.  There are bibasilar opacities (left greater than right) which may reflect areas of atelectasis and/or consolidation.  Mild blunting of the costophrenic sulci is noted on the lateral projection, which may suggest the presence of trace bilateral pleural effusions.  Mild cephalization of the pulmonary vasculature without frank pulmonary edema.  Heart size is borderline enlarged.  Atherosclerotic calcifications are noted within the arch of the aorta. Lateral projection demonstrates flowing anterior osteophytes throughout the thoracic spine compatible with diffuse idiopathic hyperostosis (DISH).  IMPRESSION: 1.  Ill-defined bibasilar opacities may reflect areas of atelectasis and/or consolidation.  Findings may reflect sequela of recent aspiration. 2.  Atherosclerosis. 3.  DISH.  Original Report Authenticated By: Florencia Reasons, M.D.  Dg Chest Port 1 View  05/28/2011  *RADIOLOGY REPORT*  Clinical Data: 76 year old female with chest pressure, shortness of breath, diabetes.  PORTABLE CHEST - 1 VIEW  Comparison: 05/11/2011 and earlier.  Findings: AP portable seated upright view 1220 hours.  Stable cardiomegaly and prominence of central pulmonary vasculature. Stable tortuous thoracic aorta.  No pneumothorax, or pulmonary edema.  Chronic left basilar reticulonodular opacity not significantly changed.  No definite  effusion or acute airspace disease.  IMPRESSION: Stable chronic-appearing left base reticulonodular density. Suspect chronic central pulmonary vascular enlargement such as due to pulmonary artery hypertension. No superimposed acute findings are identified.  Original Report Authenticated By: Harley Hallmark, M.D.   Medications: I have reviewed the patient's current medications. Scheduled Meds:   . acetaminophen  650 mg Oral Once  . albuterol  2.5 mg Nebulization TID  . amLODipine  10 mg Oral Daily  . atorvastatin  10 mg Oral q1800  . carvedilol  25 mg Oral BID WC  . clopidogrel  75 mg Oral Daily  . docusate sodium  100 mg Oral BID  . enoxaparin  40 mg Subcutaneous Q24H  . furosemide  20 mg Oral Daily  . insulin aspart  0-9 Units Subcutaneous TID WC  . levothyroxine  100 mcg Oral QAC breakfast  . lisinopril  5 mg Oral Daily  . metFORMIN  1,000 mg Oral BID WC  . moxifloxacin  400 mg Intravenous Q24H  . pantoprazole  40 mg Oral Q1200  . PARoxetine  20 mg Oral Daily  . predniSONE  40 mg Oral Q breakfast  . sodium chloride  500 mL Intravenous Once  . tiotropium  18 mcg Inhalation Daily  . DISCONTD: albuterol  2.5 mg Nebulization Q6H  . DISCONTD: albuterol  2.5 mg Nebulization TID  . DISCONTD: ipratropium  0.5 mg Nebulization Q6H  . DISCONTD: ipratropium  0.5 mg Nebulization Q6H  . DISCONTD: ipratropium  0.5 mg Nebulization TID  . DISCONTD: metFORMIN  1,000 mg Oral BID WC   Continuous Infusions:   . sodium chloride 75 mL/hr at 05/28/11 2022  . DISCONTD: sodium chloride     PRN Meds:.acetaminophen, acetaminophen, albuterol, ipratropium, morphine, ondansetron (ZOFRAN) IV, ondansetron, zolpidem, DISCONTD: ondansetron (ZOFRAN) IV  Assessment/Plan: The patient is a 76 yo woman, history of COPD, presenting with a 3-week history of increased cough frequency, with production of yellow-white sputum.   # Cough/SOB - the patient notes increased cough frequency, increased sputum production, and  increased sputum purulence, consistent with a COPD exacerbation. The patient is improving. -duonebs q4   -prednisone 40 daily, will taper as outpatient -moxifloxacin  -PT/OT - recommended home health PT, awaiting OT recs -start spiriva today, to be continued as outpatient  # CHF - with EF = 55%, ?MI in past (normal cath in 99)  -continue carvedilol, lisinopril, amlodipine, lasix   # HL  -statin   # Hypothyroidism  -continue synthroid   # DM - Hb A1C = 6.5, on home glipizide and metformin  -SSI, metformin   # GERD  -protonix   # Prophy  -lovenox  # Dispo - as patient is feeling better, on only PO medications, and saturating well on room air, plan for discharge later this afternoon.   LOS: 1 day   Janalyn Harder 05/29/2011, 11:31 AM

## 2011-05-29 NOTE — Progress Notes (Signed)
05-29-11 UR completed. Aylla Huffine RN BSN  

## 2011-05-31 LAB — URINE CULTURE
Colony Count: 40000
Culture  Setup Time: 201304251406

## 2011-06-02 NOTE — Progress Notes (Signed)
Addended by: Remus Blake on: 06/02/2011 02:14 PM   Modules accepted: Orders

## 2011-06-02 NOTE — ED Provider Notes (Signed)
Medical screening examination/treatment/procedure(s) were conducted as a shared visit with non-physician practitioner(s) and myself.  I personally evaluated the patient during the encounter.  77 year old female with dyspnea. Likely secondary to COPD exacerbation. Wheezing on exam. EKG nonprovocative. Trop WNL. Patient desats with ambulation with O2 sat in the 80s. Patient does not have a home oxygen requirement. Will defer further treatment and evaluation.  Raeford Razor, MD 06/02/11 1250

## 2011-06-17 ENCOUNTER — Ambulatory Visit (INDEPENDENT_AMBULATORY_CARE_PROVIDER_SITE_OTHER): Payer: Medicare Other | Admitting: Internal Medicine

## 2011-06-17 ENCOUNTER — Encounter: Payer: Self-pay | Admitting: Internal Medicine

## 2011-06-17 VITALS — BP 108/63 | HR 65 | Temp 97.7°F | Ht 68.0 in | Wt 221.2 lb

## 2011-06-17 DIAGNOSIS — N393 Stress incontinence (female) (male): Secondary | ICD-10-CM | POA: Insufficient documentation

## 2011-06-17 DIAGNOSIS — J441 Chronic obstructive pulmonary disease with (acute) exacerbation: Secondary | ICD-10-CM

## 2011-06-17 DIAGNOSIS — R32 Unspecified urinary incontinence: Secondary | ICD-10-CM

## 2011-06-17 DIAGNOSIS — E119 Type 2 diabetes mellitus without complications: Secondary | ICD-10-CM

## 2011-06-17 DIAGNOSIS — I10 Essential (primary) hypertension: Secondary | ICD-10-CM

## 2011-06-17 MED ORDER — IPRATROPIUM BROMIDE HFA 17 MCG/ACT IN AERS: 2.0000 | INHALATION_SPRAY | Freq: Four times a day (QID) | RESPIRATORY_TRACT | Status: DC

## 2011-06-17 MED ORDER — AEROCHAMBER PLUS W/MASK LARGE MISC: 1.0000 | Freq: Once | Status: DC

## 2011-06-17 NOTE — Patient Instructions (Signed)
Make follow appointment in 2-3 weeks for blood pressure check.  Stop using Lasix until next visit and see if it helps with your urinary leakage. Also start doing Kegel exercises as we described in today. These exercises will help you decrease the leakage. Avoid using pads, if possible- to prevent urinary infections.  Also stop using Spiriva and start using Atrovent. Also use spacer while use albuterol and Atrovent.  Get the pulmonary function tests- which will be scheduled for you.

## 2011-06-17 NOTE — Assessment & Plan Note (Signed)
No wheezes or crackles. No acute exacerbation. Will get full PFTs as outpatient. As patient not to be to tolerate Spiriva, will change it to Atrovent- 4 times daily. This was discussed in detail with Dr. Josem Kaufmann

## 2011-06-17 NOTE — Assessment & Plan Note (Signed)
Lab Results  Component Value Date   HGBA1C 6.5 03/17/2011   HGBA1C 7.8 08/21/2009   CREATININE 0.94 05/29/2011   MICROALBUR 0.91 11/20/2010   MICRALBCREAT 10.5 11/20/2010   CHOL 206* 04/09/2010   HDL 53 04/09/2010   TRIG 237* 04/09/2010    Last eye exam and foot exam:    Component Value Date/Time   HMDIABFOOTEX done 12/23/2009    Assessment: Diabetes control: controlled Progress toward goals: unchanged Barriers to meeting goals: no barriers identified  Plan: Diabetes treatment: continue current medications Refer to: none Instruction/counseling given: reminded to get eye exam, reminded to bring medications to each visit and discussed diet

## 2011-06-17 NOTE — Assessment & Plan Note (Signed)
Blood pressure 102/63 today. Well controlled. Will hold Lasix due to urinary continence- until next visit. Continue amlodipine, lisinopril and Coreg.

## 2011-06-17 NOTE — Assessment & Plan Note (Signed)
Patient having urinary encounters for past 2 years using pads- widely due to which she got frequent UTIs. On exam, she doesn't have any you from prolapse or vaginitis. Although She is on Lasix 20 mg daily- which might be complicated process. - Discussed with Dr. Josem Kaufmann in detail, who was present during the pelvic exam to supervise. We decided to hold Lasix until next visit as her last EF was 55% in 2010 and he only has grade 1 diastolic dysfunction and has no CHF exacerbation admissions in past 2 years. Explain her to do Kegel exercises- to strengthen her pelvic floor muscles. No need of GYN or urology referral at present

## 2011-06-17 NOTE — Progress Notes (Signed)
  Subjective:    Patient ID: Caitlyn Aguirre, female    DOB: 1935-06-21, 76 y.o.   MRN: [id number]  HPI patient is a pleasant 26 year woman with past history of hypertension, DM 2, hypothyroidism who comes to the clinic for hospital followup visit. She was admitted in hospital in end of April for acute COPD exacerbation and was discharged home on steroid taper, antibiotics and albuterol and Spiriva. Patient felt better for few days and then started feeling weak. Her shortness of breath also started getting worse slowly but had no wheezing or cough. She did not use Spiriva daily, just used it 4 or 5 times since discharge. Says it's uncomfortable to use and wants it to be changed to something else. Does not have any fever, chills, nausea vomiting, abdominal pain, chest pain, diarrhea.  She does complain of urinary incontinence for past 2 years and is wearing pads for that everyday and also has to frequently change them. She also had 3 UTIs in past 10 months- which were appropriately treated with antibiotics.  Currently she has no other symptoms except urine incontinence. Doesn't have any burning, suprapubic pain. She does wake up at night frequently.  She is unclear if she leaks urine while sneezing or coughing- or times of increased intra-abdominal pressure. She is postmenopausal, is on Lasix 20 mg daily for grade 1 diastolic CHF and hypertension. She has had 4 kids but all of them were delivered via hysterectomy- no vaginal deliveries. She does not have any stool incontinence, also does not have any new gait abnormalities.   Review of Systems    as per history of present illness, all other systems are reviewed and negative Objective:   Physical Exam  General: NAD HEENT: PERRL, EOMI, no scleral icterus Cardiac: RRR, no rubs, murmurs or gallops Pulm: clear to auscultation bilaterally, moving normal volumes of air Abd: soft, nontender, nondistended, BS present Ext: warm and well perfused, no  pedal edema Neuro: alert and oriented X3, cranial nerves II-XII grossly intact Pelvic: No vulvar ulcers or rash noted . No vaginal wall inflammation or discharge. No uterine prolapse noted. Bimanual palpation did not have any cervical motion tenderness.       Assessment & Plan:

## 2011-06-26 ENCOUNTER — Ambulatory Visit (HOSPITAL_COMMUNITY)
Admission: RE | Admit: 2011-06-26 | Discharge: 2011-06-26 | Disposition: A | Discharge status: Home / Self Care | Payer: Medicare Other | Source: Ambulatory Visit | Attending: Internal Medicine | Admitting: Internal Medicine

## 2011-06-26 ENCOUNTER — Encounter (HOSPITAL_COMMUNITY): Payer: Self-pay | Admitting: *Deleted

## 2011-06-26 ENCOUNTER — Emergency Department (HOSPITAL_COMMUNITY)
Admission: EM | Admit: 2011-06-26 | Discharge: 2011-06-26 | Disposition: A | Discharge status: Other Acute Inpatient Hospital | Payer: Medicare Other

## 2011-06-26 DIAGNOSIS — J441 Chronic obstructive pulmonary disease with (acute) exacerbation: Secondary | ICD-10-CM | POA: Insufficient documentation

## 2011-06-26 MED ORDER — ALBUTEROL SULFATE (5 MG/ML) 0.5% IN NEBU
2.5000 mg | INHALATION_SOLUTION | Freq: Once | RESPIRATORY_TRACT | Status: AC
  Administered 2011-06-26: 2.5 mg via RESPIRATORY_TRACT

## 2011-06-26 NOTE — ED Notes (Addendum)
Pt states outpatient clinic RN called her and told her to come to the 1st floor to get breathing tx. Pt states she was in hospital a couple weeks ago for sob. Pt is talking in complete sentences. Room air sat >93.

## 2011-07-14 ENCOUNTER — Other Ambulatory Visit: Payer: Self-pay | Admitting: *Deleted

## 2011-07-14 ENCOUNTER — Other Ambulatory Visit: Payer: Self-pay | Admitting: Internal Medicine

## 2011-07-14 NOTE — Telephone Encounter (Signed)
Also pharmacy request refill on Hydrocodone/apap 5-500mg  tab #60 - [facility].

## 2011-07-19 MED ORDER — ZOLPIDEM TARTRATE 10 MG PO TABS: 10.0000 mg | ORAL_TABLET | Freq: Every evening | ORAL | Status: DC | PRN

## 2011-07-20 NOTE — Telephone Encounter (Signed)
Rx called in to pharmacy. 

## 2011-07-22 ENCOUNTER — Other Ambulatory Visit: Payer: Self-pay | Admitting: *Deleted

## 2011-07-22 DIAGNOSIS — E119 Type 2 diabetes mellitus without complications: Secondary | ICD-10-CM

## 2011-07-22 NOTE — Telephone Encounter (Signed)
Need lancing device - please

## 2011-07-25 MED ORDER — ACCU-CHEK FASTCLIX LANCETS MISC: 1.0000 | Freq: Two times a day (BID) | Status: DC

## 2011-08-13 ENCOUNTER — Other Ambulatory Visit: Payer: Self-pay | Admitting: *Deleted

## 2011-08-17 MED ORDER — CARVEDILOL 25 MG PO TABS: 25.0000 mg | ORAL_TABLET | Freq: Every day | ORAL | Status: DC

## 2011-08-17 MED ORDER — SIMVASTATIN 20 MG PO TABS: 20.0000 mg | ORAL_TABLET | Freq: Every morning | ORAL | Status: DC

## 2011-08-18 ENCOUNTER — Telehealth: Payer: Self-pay | Admitting: *Deleted

## 2011-08-18 NOTE — Telephone Encounter (Signed)
Pharm states carvedilol is usually 1 twice daily, do you want only once daily?

## 2011-08-19 MED ORDER — CARVEDILOL 25 MG PO TABS: 25.0000 mg | ORAL_TABLET | Freq: Two times a day (BID) | ORAL | Status: DC

## 2011-08-19 NOTE — Telephone Encounter (Signed)
Changed it to BID. The refill came as once daily.Marland Kitchen!!!  Thanks.

## 2011-09-08 ENCOUNTER — Other Ambulatory Visit: Payer: Self-pay | Admitting: Internal Medicine

## 2011-09-10 ENCOUNTER — Other Ambulatory Visit: Payer: Self-pay | Admitting: *Deleted

## 2011-09-10 NOTE — Telephone Encounter (Signed)
Also requesting refill on - Hydrocodone/APAP  5-500mg   Take 1 tab by mouth twice daily as needed for pain. Qty# 60.

## 2011-09-15 MED ORDER — ZOLPIDEM TARTRATE 10 MG PO TABS: 10.0000 mg | ORAL_TABLET | Freq: Every evening | ORAL | Status: DC | PRN

## 2011-09-15 MED ORDER — HYDROCODONE-ACETAMINOPHEN 5-500 MG PO TABS: 1.0000 | ORAL_TABLET | Freq: Three times a day (TID) | ORAL | Status: AC | PRN

## 2011-09-15 NOTE — Telephone Encounter (Signed)
Ambien and Vicodin refilled- request form faxed to [facility].

## 2011-09-29 ENCOUNTER — Encounter: Payer: Self-pay | Admitting: Internal Medicine

## 2011-09-29 ENCOUNTER — Ambulatory Visit (INDEPENDENT_AMBULATORY_CARE_PROVIDER_SITE_OTHER): Payer: Medicare Other | Admitting: Internal Medicine

## 2011-09-29 VITALS — BP 157/89 | HR 73 | Temp 97.7°F | Ht 68.0 in | Wt 221.6 lb

## 2011-09-29 DIAGNOSIS — Z79899 Other long term (current) drug therapy: Secondary | ICD-10-CM

## 2011-09-29 DIAGNOSIS — M549 Dorsalgia, unspecified: Secondary | ICD-10-CM

## 2011-09-29 DIAGNOSIS — R059 Cough, unspecified: Secondary | ICD-10-CM

## 2011-09-29 DIAGNOSIS — R05 Cough: Secondary | ICD-10-CM

## 2011-09-29 DIAGNOSIS — E785 Hyperlipidemia, unspecified: Secondary | ICD-10-CM

## 2011-09-29 DIAGNOSIS — E119 Type 2 diabetes mellitus without complications: Secondary | ICD-10-CM

## 2011-09-29 DIAGNOSIS — G8929 Other chronic pain: Secondary | ICD-10-CM

## 2011-09-29 LAB — BASIC METABOLIC PANEL
BUN: 6 mg/dL (ref 6–23)
CO2: 25 mEq/L (ref 19–32)
Calcium: 10.4 mg/dL (ref 8.4–10.5)
Chloride: 109 mEq/L (ref 96–112)
Creat: 0.78 mg/dL (ref 0.50–1.10)
Glucose, Bld: 157 mg/dL — ABNORMAL HIGH (ref 70–99)
Potassium: 3.7 mEq/L (ref 3.5–5.3)
Sodium: 144 mEq/L (ref 135–145)

## 2011-09-29 LAB — LIPID PANEL
Cholesterol: 195 mg/dL (ref 0–200)
HDL: 54 mg/dL (ref >39)
LDL Cholesterol: 120 mg/dL — ABNORMAL HIGH (ref 0–99)
Total CHOL/HDL Ratio: 3.6 Ratio
Triglycerides: 106 mg/dL (ref <150)
VLDL: 21 mg/dL (ref 0–40)

## 2011-09-29 LAB — PRO B NATRIURETIC PEPTIDE: Pro B Natriuretic peptide (BNP): 30.9 pg/mL (ref <126)

## 2011-09-29 LAB — POCT GLYCOSYLATED HEMOGLOBIN (HGB A1C): Hemoglobin A1C: 6.9

## 2011-09-29 LAB — GLUCOSE, CAPILLARY: Glucose-Capillary: 156 mg/dL — ABNORMAL HIGH (ref 70–99)

## 2011-09-29 MED ORDER — FUROSEMIDE 20 MG PO TABS: 20.0000 mg | ORAL_TABLET | Freq: Every day | ORAL | Status: DC

## 2011-09-29 MED ORDER — TIOTROPIUM BROMIDE MONOHYDRATE 18 MCG IN CAPS: 18.0000 ug | ORAL_CAPSULE | Freq: Every day | RESPIRATORY_TRACT | Status: DC

## 2011-09-29 NOTE — Patient Instructions (Addendum)
-  Please start taking Lasix 20 mg daily (this is your water pill)  -Let's give Spiriva another chance, please inhale one capsule daily.  -I am drawing some blood work, and will call you with the results.  -Your cough may be due to a virus, so we are going to continue to watch it, but also we are going to take some fluid off and the inhaler will help you breathe.
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
## 2011-09-29 NOTE — Assessment & Plan Note (Signed)
Acute visit today.  A1c drawn= 6.9 today.  Patient compliant with glipizide and metformin.  Only taking Metformin 1000mg  daily. Lipid panel drawn today.  Patient to return in 1 month to follow up with PCP for routine health maintenance.

## 2011-09-29 NOTE — Assessment & Plan Note (Addendum)
It is difficult to assess if patient's symptoms are secondary to a viral URI, COPD exacerbation, or dCHF exacerbation. She is saturating 95% on room air. At her last visit, Lasix was discontinued because of urinary incontinence, and she reports that urinary incontinence has not changed significantly. For this reason, I will restart Lasix 20 mg daily. I will also check a pro BNP and renal function. Of note, patient does not exhibit signs of fluid overload on exam. I hope that with restarting Lasix, her blood pressure will improve as well (though this might be elevated in the setting of acute illness). While she exhibits worsening cough with increased sputum production, she denies increased use of her inhaler, and she does not exhibit wheezing on exam. We discussed restarting Spiriva, and she is willing to give it one more try. She had difficulty with the capsules before, and side asked that when she gets the medication, she take it to a pharmacy so they may show her how to use this medication.  I will have her return in one week for followup, since if this is a viral URI, symptoms should have resolved. We may reevaluate the need for antibiotics & CXR at this visit.  ADDENDUM: pBNP is wnl.  Differential is now viral vs bacterial URI.  Will reassess at 1 week f/u.

## 2011-09-30 NOTE — Assessment & Plan Note (Signed)
ADDENDUM: LDL = 120 on 09/29/11.  Patient not on adequate statin therapy (taking only simvastatin 20 in setting of amlodipine treatment for HTN).  Discuss changing therapy to Lipitor either at follow up next week or when she follows up for routine DM f/u with PCP.

## 2011-10-02 ENCOUNTER — Other Ambulatory Visit: Payer: Self-pay | Admitting: *Deleted

## 2011-10-02 DIAGNOSIS — G8929 Other chronic pain: Secondary | ICD-10-CM

## 2011-10-07 ENCOUNTER — Encounter: Payer: Self-pay | Admitting: Internal Medicine

## 2011-10-07 ENCOUNTER — Ambulatory Visit (INDEPENDENT_AMBULATORY_CARE_PROVIDER_SITE_OTHER): Payer: Medicare Other | Admitting: Internal Medicine

## 2011-10-07 VITALS — BP 123/69 | HR 72 | Temp 97.7°F | Ht 68.0 in | Wt 219.9 lb

## 2011-10-07 DIAGNOSIS — R05 Cough: Secondary | ICD-10-CM

## 2011-10-07 DIAGNOSIS — B379 Candidiasis, unspecified: Secondary | ICD-10-CM

## 2011-10-07 DIAGNOSIS — E119 Type 2 diabetes mellitus without complications: Secondary | ICD-10-CM

## 2011-10-07 DIAGNOSIS — B372 Candidiasis of skin and nail: Secondary | ICD-10-CM

## 2011-10-07 DIAGNOSIS — R059 Cough, unspecified: Secondary | ICD-10-CM

## 2011-10-07 LAB — GLUCOSE, CAPILLARY: Glucose-Capillary: 132 mg/dL — ABNORMAL HIGH (ref 70–99)

## 2011-10-07 MED ORDER — NYSTATIN 100000 UNIT/GM EX POWD: CUTANEOUS | Status: DC

## 2011-10-07 MED ORDER — ZOLPIDEM TARTRATE 10 MG PO TABS: 10.0000 mg | ORAL_TABLET | Freq: Every evening | ORAL | Status: DC | PRN

## 2011-10-07 MED ORDER — HYDROCODONE-ACETAMINOPHEN 5-500 MG PO TABS: 1.0000 | ORAL_TABLET | Freq: Three times a day (TID) | ORAL | Status: DC | PRN

## 2011-10-07 NOTE — Patient Instructions (Addendum)
Please schedule a follow up appointment in 1 month with your PCP. Please bring your medication bottles with your next appointment. Please take your medicines as prescribed.  

## 2011-10-07 NOTE — Progress Notes (Deleted)
  Subjective:    Patient ID: Caitlyn Aguirre, female    DOB: 01/26/1936, 76 y.o.   MRN: [id number]  HPI Feels tired all the time - worse for for last 2-3 weeks. Cough is clear now and is getting better - gets starngled at times, feels uncomfortable in her belly from coughing a lot. No fever, chills, congestion, rhinorrhea, hemoptysis.   Reports off and on rash - worse in the waist belt but also present under the breast . Associated with redness, soreness and itching.    Review of Systems  Constitutional: Positive for fatigue. Negative for fever and appetite change.  HENT: Negative for rhinorrhea, sneezing and postnasal drip.   Eyes: Negative for visual disturbance.  Respiratory: Positive for cough. Negative for choking, shortness of breath, wheezing and stridor.   Cardiovascular: Negative for chest pain, palpitations and leg swelling.  Genitourinary: Negative for dysuria, frequency and hematuria.  Musculoskeletal: Negative for arthralgias.  Skin: Positive for rash.  Neurological: Negative for facial asymmetry, light-headedness and headaches.  Hematological: Negative for adenopathy.       Objective:   Physical Exam  Constitutional: She is oriented to person, place, and time. She appears well-developed and well-nourished. No distress.  HENT:  Head: Normocephalic and atraumatic.  Mouth/Throat: No oropharyngeal exudate.  Eyes: Conjunctivae and EOM are normal. Pupils are equal, round, and reactive to light. Right eye exhibits no discharge. Left eye exhibits no discharge. No scleral icterus.  Neck: Normal range of motion. Neck supple. No JVD present. No tracheal deviation present. No thyromegaly present.  Cardiovascular: Normal rate, regular rhythm and normal heart sounds.  Exam reveals no gallop and no friction rub.   No murmur heard. Pulmonary/Chest: Effort normal and breath sounds normal. No stridor. No respiratory distress. She has no wheezes. She has no rales.  Abdominal: Soft. Bowel  sounds are normal. She exhibits no distension. There is no tenderness. There is no rebound.  Musculoskeletal: Normal range of motion. She exhibits no edema and no tenderness.  Neurological: She is alert and oriented to person, place, and time. She has normal reflexes. No cranial nerve deficit. Coordination normal.  Skin: Skin is warm. Rash noted. She is not diaphoretic.       Intertriginous rash in the waist belt and under the breast with satellite lesions           Assessment & Plan:

## 2011-10-07 NOTE — Progress Notes (Signed)
  Subjective:    Patient ID: Caitlyn Aguirre, female    DOB: 1935-10-20, 76 y.o.   MRN: [id number]  HPI: 76 year old woman with past medical history significant for type 2 diabetes, hypertension comes to the clinic for a one-week followup visit.  Patient was last seen in the clinic about a week ago for cough that has been persistent for last 3 weeks  -she states that her cough is getting a little better. She is bringing up clear stuff as opposed to yellowish stuff last week but feels choked or strangulated at times when she has these bouts of cough. She also reports some abdominal discomfort when she coughs hard. She has started using Spiriva as directed with the last visit but states that it doesn't seem to be helping her much. Denies any fever, chills, shortness of breath or hemoptyisis. She reports feeling tired and weak all the time which is worse for last 2-3 weeks.  Her PFT's from 06/26/11 were reviewed that showed FVC of 86%, FEV1/FVC - 101% with no changes with the use of bronchodialtaor, TLC of 67% and DLCO of 38% consistent with restrictive pattern.  She also reports worsening of chronic intermittent rash in the intra-abdominal folds for last one week- associated with lot of itching.  She also has some scarring and darkening associated with that rash underneath her breast but is not active at this time.       Review of Systems  Constitutional: Positive for fatigue. Negative for fever, chills and appetite change.  HENT: Negative for nosebleeds and tinnitus.   Eyes: Negative for visual disturbance.  Respiratory: Positive for cough. Negative for wheezing and stridor.   Cardiovascular: Negative for chest pain, palpitations and leg swelling.  Musculoskeletal: Negative for arthralgias.  Skin: Positive for rash.       Objective:   Physical Exam  Constitutional: She is oriented to person, place, and time. She appears well-developed and well-nourished. No distress.  HENT:  Head:  Normocephalic and atraumatic.  Mouth/Throat: No oropharyngeal exudate.       Substantial amount of wax present in both the ears. TM intact, no discharge noticed  Neck: Normal range of motion. Neck supple. No JVD present. No tracheal deviation present. No thyromegaly present.  Cardiovascular: Normal rate, regular rhythm and normal heart sounds.  Exam reveals no gallop and no friction rub.   No murmur heard. Pulmonary/Chest: Effort normal and breath sounds normal. No stridor. No respiratory distress. She has no wheezes. She has no rales.  Abdominal: Soft. Bowel sounds are normal. She exhibits no distension. There is no tenderness. There is no rebound.  Musculoskeletal: Normal range of motion. She exhibits no edema and no tenderness.  Lymphadenopathy:    She has no cervical adenopathy.  Neurological: She is alert and oriented to person, place, and time. She has normal reflexes. She displays normal reflexes. No cranial nerve deficit. Coordination normal.  Skin: Skin is warm. She is not diaphoretic.       Erythematous rash in the intraabdominal folds, under the breast with possible satellite lesions          Assessment & Plan:

## 2011-10-08 DIAGNOSIS — B372 Candidiasis of skin and nail: Secondary | ICD-10-CM | POA: Insufficient documentation

## 2011-10-08 MED ORDER — OMEPRAZOLE 20 MG PO CPDR: 20.0000 mg | DELAYED_RELEASE_CAPSULE | Freq: Every day | ORAL | Status: DC

## 2011-10-08 NOTE — Assessment & Plan Note (Signed)
Findings on exam are consistent with Candidal rash.  -She was given the prescription for nystatin powder. -Advised to wear cotton underclothes and keep the area dry.

## 2011-10-08 NOTE — Assessment & Plan Note (Addendum)
She reports some improvement in her cough but is still bothering her-bringing up clear sputum as opposed to yellowish in the last week. Denies any hemoptysis.Clearly the patient is not in CHF excacerberation with no signs of fluid overload on exam. No active wheezing, good air entry b/l that rules out COPD.Of note , patient 's PFT from 05/13 are consistent with restrictive rather than obstructive disease pattern. She has been using ACE- I for long time and this cough is new for last 2-3 weeks. This could be related to GERD . I think this is most likely from viral URI versus seasonal allergies. - Advised to drink plenty of fluids. - Steam inhalation - Switch her from rantac to omeprazole. It was noted that PPI decreases the efficacy of antiplatelet effects of clopidogrel but in the setting of this prolonged cough, I would try her on PPI for 1 month and then switch her back to rantac.  - d/c lasix in the absence of any signs of fluid overload.

## 2011-10-08 NOTE — Telephone Encounter (Signed)
Vicodin and Ambien rxs called to [facility].

## 2011-11-03 ENCOUNTER — Other Ambulatory Visit: Payer: Self-pay | Admitting: Internal Medicine

## 2011-11-04 ENCOUNTER — Other Ambulatory Visit: Payer: Self-pay | Admitting: *Deleted

## 2011-11-04 MED ORDER — ZOLPIDEM TARTRATE 10 MG PO TABS: 10.0000 mg | ORAL_TABLET | Freq: Every evening | ORAL | Status: DC | PRN

## 2011-11-04 NOTE — Telephone Encounter (Signed)
Rx faxed in.

## 2011-11-04 NOTE — Telephone Encounter (Signed)
Last filled 8/30

## 2011-11-09 ENCOUNTER — Other Ambulatory Visit: Payer: Self-pay | Admitting: *Deleted

## 2011-11-09 DIAGNOSIS — G8929 Other chronic pain: Secondary | ICD-10-CM

## 2011-11-11 ENCOUNTER — Encounter: Payer: Medicare Other | Admitting: Internal Medicine

## 2011-11-11 MED ORDER — HYDROCODONE-ACETAMINOPHEN 5-500 MG PO TABS: 1.0000 | ORAL_TABLET | Freq: Three times a day (TID) | ORAL | Status: DC | PRN

## 2011-11-11 NOTE — Telephone Encounter (Signed)
Called to pharm 

## 2011-11-18 ENCOUNTER — Telehealth: Payer: Self-pay | Admitting: *Deleted

## 2011-11-18 NOTE — Telephone Encounter (Signed)
Carley Hammed RN with [employer] called about pt - in Heart Failure program - SOB, legs swollen, tired and back hurting. Aware clinic will see pt 11/19/11. Talked with pt and is aware C.Lissa Hoard will call pt with time 11/19/11 with Dr Lavena Bullion. Stanton Kidney Ermie Glendenning RN 11/18/11 3:15PM

## 2011-11-19 ENCOUNTER — Encounter: Payer: Self-pay | Admitting: Internal Medicine

## 2011-11-19 ENCOUNTER — Ambulatory Visit (INDEPENDENT_AMBULATORY_CARE_PROVIDER_SITE_OTHER): Payer: Medicare Other | Admitting: Internal Medicine

## 2011-11-19 VITALS — BP 112/63 | HR 71 | Temp 97.8°F | Ht 68.0 in | Wt 217.5 lb

## 2011-11-19 DIAGNOSIS — R21 Rash and other nonspecific skin eruption: Secondary | ICD-10-CM

## 2011-11-19 DIAGNOSIS — L299 Pruritus, unspecified: Secondary | ICD-10-CM | POA: Insufficient documentation

## 2011-11-19 DIAGNOSIS — B372 Candidiasis of skin and nail: Secondary | ICD-10-CM

## 2011-11-19 DIAGNOSIS — Z0000 Encounter for general adult medical examination without abnormal findings: Secondary | ICD-10-CM

## 2011-11-19 DIAGNOSIS — Z23 Encounter for immunization: Secondary | ICD-10-CM

## 2011-11-19 DIAGNOSIS — I10 Essential (primary) hypertension: Secondary | ICD-10-CM

## 2011-11-19 DIAGNOSIS — R059 Cough, unspecified: Secondary | ICD-10-CM

## 2011-11-19 DIAGNOSIS — R0602 Shortness of breath: Secondary | ICD-10-CM

## 2011-11-19 DIAGNOSIS — R05 Cough: Secondary | ICD-10-CM

## 2011-11-19 MED ORDER — NEOMYCIN-POLYMYXIN-HC 3.5-10000-1 OT SOLN: 3.0000 [drp] | Freq: Four times a day (QID) | OTIC | Status: DC

## 2011-11-19 MED ORDER — KETOCONAZOLE 2 % EX CREA: TOPICAL_CREAM | Freq: Every day | CUTANEOUS | Status: DC

## 2011-11-19 MED ORDER — ALBUTEROL SULFATE HFA 108 (90 BASE) MCG/ACT IN AERS: 2.0000 | INHALATION_SPRAY | Freq: Four times a day (QID) | RESPIRATORY_TRACT | Status: DC | PRN
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
## 2011-11-19 NOTE — Assessment & Plan Note (Signed)
Patient's skin rashes are most likely caused by candidiasis. We did examination with wood lamp, which didn't light up in the bright red color, which is consistent with candidiasis. Since patient didn't response to the nystatin treatment very well, we'll switch to ketoconazole cream. Patient is also instructed to use ketoconazole cream for her finger skin lesions. If her finger skin lesion response to this treatment, it will confirmed the candidiasis diagnosis for her finger skin lesion, otherwise it might be caused by eczema. Then we will treat accordingly for eczema.

## 2011-11-19 NOTE — Patient Instructions (Signed)
1. Please use the Cortisporin for your ear itching. Please use Ketoconazole cream for your skin rashes and your left index finger lesion. Please keep your sleep study appointment. 2. Please take all medications as prescribed.  3. If you have worsening of your symptoms or new symptoms arise, please call the clinic ([phone]), or go to the ER immediately if symptoms are severe.  You have done great job in taking all your medications. I appreciate it very much. Please continue doing that.

## 2011-11-19 NOTE — Assessment & Plan Note (Signed)
Etiology is not very clear at this moment. Per Dr. Josem Kaufmann, it looks like eczema. Will treat patient with Cortisporin ear drop empirically and reevaluate in next visit.

## 2011-11-19 NOTE — Assessment & Plan Note (Signed)
Patient has chronic cough which has been worsening in the past several weeks. Patient carries the diagnosis of COPD, but her pulmonary function test did not show the typical COPD, instead showed a restrictive pattern of pulmonary disease. Patient's previous chest x-ray showed possible pulmonary hypertension. I discussed with Dr. Josem Kaufmann, who is concerning for possible OSA. Another differential diagnosis is chronic PE though less likely.   -will get sleep study first. If positive for OSA, we'll treat the patient accordingly. If it is negative for OSA, we'll look for other etiology, such as a chronic PE. -will continue albuterol inhaler.

## 2011-11-19 NOTE — Assessment & Plan Note (Signed)
It is well controlled. Today blood pressure is 112/63. We'll continue current regimen.

## 2011-11-25 ENCOUNTER — Encounter: Payer: Medicare Other | Admitting: Internal Medicine

## 2011-11-25 ENCOUNTER — Telehealth: Payer: Self-pay | Admitting: *Deleted

## 2011-11-25 NOTE — Telephone Encounter (Signed)
I think its only Prilosec. No Ranitidine.  Thanks.

## 2011-11-25 NOTE — Telephone Encounter (Signed)
Fax from [facility].  Wants to know if pt is taking both Omeprazole 20mg  and Ranitidine 150mg  ? Please clarify.

## 2011-11-25 NOTE — Telephone Encounter (Signed)
The pharmacy made awared.

## 2011-12-02 ENCOUNTER — Other Ambulatory Visit: Payer: Self-pay | Admitting: *Deleted

## 2011-12-03 MED ORDER — ZOLPIDEM TARTRATE 10 MG PO TABS: 10.0000 mg | ORAL_TABLET | Freq: Every evening | ORAL | Status: DC | PRN

## 2011-12-03 MED ORDER — GLIPIZIDE ER 5 MG PO TB24: 5.0000 mg | ORAL_TABLET | Freq: Every day | ORAL | Status: DC

## 2011-12-04 ENCOUNTER — Other Ambulatory Visit: Payer: Self-pay | Admitting: *Deleted

## 2011-12-04 DIAGNOSIS — G8929 Other chronic pain: Secondary | ICD-10-CM

## 2011-12-04 DIAGNOSIS — M549 Dorsalgia, unspecified: Secondary | ICD-10-CM

## 2011-12-04 NOTE — Telephone Encounter (Signed)
Both Rx called in to pharmacy.Message left on home phone ID recording. Stanton Kidney Rai Sinagra RN 12/04/11 3:15PM

## 2011-12-07 ENCOUNTER — Other Ambulatory Visit: Payer: Self-pay | Admitting: *Deleted

## 2011-12-07 DIAGNOSIS — E119 Type 2 diabetes mellitus without complications: Secondary | ICD-10-CM

## 2011-12-07 MED ORDER — HYDROCODONE-ACETAMINOPHEN 5-325 MG PO TABS: 1.0000 | ORAL_TABLET | Freq: Three times a day (TID) | ORAL | Status: DC | PRN

## 2011-12-07 NOTE — Telephone Encounter (Signed)
Rx was called in to pharmacy. Pt aware.

## 2011-12-11 MED ORDER — GLUCOSE BLOOD VI STRP: ORAL_STRIP | Status: DC

## 2011-12-11 MED ORDER — ACCU-CHEK AVIVA PLUS W/DEVICE KIT: 1.0000 | PACK | Freq: Two times a day (BID) | Status: DC

## 2011-12-11 MED ORDER — ACCU-CHEK FASTCLIX LANCETS MISC: 1.0000 | Freq: Two times a day (BID) | Status: DC

## 2011-12-15 ENCOUNTER — Ambulatory Visit (HOSPITAL_BASED_OUTPATIENT_CLINIC_OR_DEPARTMENT_OTHER): Payer: Medicare Other | Attending: Internal Medicine | Admitting: Radiology

## 2011-12-15 VITALS — Ht 68.0 in | Wt 218.0 lb

## 2011-12-15 DIAGNOSIS — R0602 Shortness of breath: Secondary | ICD-10-CM | POA: Insufficient documentation

## 2011-12-15 DIAGNOSIS — R609 Edema, unspecified: Secondary | ICD-10-CM | POA: Insufficient documentation

## 2011-12-15 DIAGNOSIS — R5383 Other fatigue: Secondary | ICD-10-CM | POA: Insufficient documentation

## 2011-12-15 DIAGNOSIS — R0989 Other specified symptoms and signs involving the circulatory and respiratory systems: Secondary | ICD-10-CM | POA: Insufficient documentation

## 2011-12-15 DIAGNOSIS — G4733 Obstructive sleep apnea (adult) (pediatric): Secondary | ICD-10-CM

## 2011-12-15 DIAGNOSIS — R5381 Other malaise: Secondary | ICD-10-CM | POA: Insufficient documentation

## 2011-12-15 DIAGNOSIS — R0609 Other forms of dyspnea: Secondary | ICD-10-CM | POA: Insufficient documentation

## 2011-12-15 DIAGNOSIS — I10 Essential (primary) hypertension: Secondary | ICD-10-CM | POA: Insufficient documentation

## 2011-12-15 DIAGNOSIS — K219 Gastro-esophageal reflux disease without esophagitis: Secondary | ICD-10-CM | POA: Insufficient documentation

## 2011-12-18 DIAGNOSIS — G4733 Obstructive sleep apnea (adult) (pediatric): Secondary | ICD-10-CM

## 2011-12-19 NOTE — Procedures (Signed)
NAME:  Caitlyn Aguirre, Caitlyn Aguirre NO.:  [id number]  MEDICAL RECORD NO.:  [id number]          PATIENT TYPE:  OUT  LOCATION:  SLEEP CENTER                 FACILITY:  [facility]  PHYSICIAN:  Cheyene Hamric D. Maple Hudson, MD, FCCP, FACPDATE OF BIRTH:  06/16/1937  DATE OF STUDY:  12/15/2011                           NOCTURNAL POLYSOMNOGRAM  REFERRING PHYSICIAN:  Doneen Poisson, MD  INDICATION FOR STUDY:  Insomnia with sleep apnea.  EPWORTH SLEEPINESS SCORE:  5/24.  BMI 33.1, weight 218 pounds, height 68 inches, neck 15 inches.  MEDICATIONS:  Home medications are charted and reviewed.  SLEEP ARCHITECTURE:  Split study protocol.  During the diagnostic phase, total sleep time 143 minutes with sleep efficiency 74.7%.  Stage I was 22.4%, stage II 77.6%, stages III and REM were absent.  Sleep latency 14.5 minutes, awake after sleep onset 33 minutes.  Arousal index 52.  BEDTIME MEDICATIONS:  Zolpidem, hydrocodone-acetaminophen.  RESPIRATORY DATA:  Split study protocol.  Apnea-hypopnea index (AHI) 58.3 per hour.  A total of 139 events was scored including 21 obstructive apneas, 1 mixed apnea, 117 hypopneas.  Events were not positional.  CPAP was then titrated to 12 CWP, AHI 1.2 per hour.  She wore a medium ResMed Mirage Quattro full-face mask with heated humidifier and an EPR of 3.  Supplemental oxygen was applied at 1 L/minute starting at 4:28 a.m. to maintain oxygen saturation above 90%.  OXYGEN DATA:  Moderately loud snoring before CPAP with oxygen desaturation before CPAP to a nadir of 81% on room air.  With CPAP titration and supplemental oxygen at 1 L/minute, snoring was prevented and mean oxygen saturation held 90.4% on room air.  CARDIAC DATA:  Sinus rhythm with PVCs.  MOVEMENT-PARASOMNIA:  No significant movement disturbance.  Bathroom x2.  IMPRESSIONS-RECOMMENDATIONS: 1. Sleep was marked by frequent awakening despite bedtime medication     with Zolpidem and hydrocodone-acetaminophen  taken at 9:30 p.m. 2. Severe obstructive sleep apnea/hypopnea syndrome, AHI 58.3 per hour     with non-positional events, moderately loud snoring and oxygen     desaturation to a nadir of 81% on room air. 3. Successful CPAP titration to 12 CWP, AHI 1.2 per hour.  She wore a     medium ResMed Mirage Quattro full-face mask with heated humidifier     and an EPR of 3. 4. Supplemental oxygen was added at 1 L/minute at 4:28 a.m. per lab     protocol to maintain oxygen saturation above 90% because she did     not correct completely with CPAP.     Zaylin Runco D. Maple Hudson, MD, [facility], FACP Diplomate, American Board of Sleep Medicine    CDY/MEDQ  D:  12/19/2011 12:02:00  T:  12/19/2011 22:56:23  Job:  [id number]

## 2011-12-25 ENCOUNTER — Other Ambulatory Visit: Payer: Self-pay | Admitting: *Deleted

## 2011-12-25 ENCOUNTER — Encounter: Payer: Self-pay | Admitting: Internal Medicine

## 2011-12-25 DIAGNOSIS — G4733 Obstructive sleep apnea (adult) (pediatric): Secondary | ICD-10-CM | POA: Insufficient documentation

## 2011-12-25 DIAGNOSIS — G8929 Other chronic pain: Secondary | ICD-10-CM

## 2011-12-25 DIAGNOSIS — R21 Rash and other nonspecific skin eruption: Secondary | ICD-10-CM

## 2011-12-28 MED ORDER — HYDROCODONE-ACETAMINOPHEN 5-325 MG PO TABS: 1.0000 | ORAL_TABLET | Freq: Three times a day (TID) | ORAL | Status: DC | PRN

## 2011-12-28 MED ORDER — KETOCONAZOLE 2 % EX CREA: TOPICAL_CREAM | Freq: Every day | CUTANEOUS | Status: DC

## 2011-12-28 NOTE — Telephone Encounter (Signed)
Called to pharm 

## 2011-12-30 ENCOUNTER — Other Ambulatory Visit: Payer: Self-pay | Admitting: *Deleted

## 2011-12-30 MED ORDER — LISINOPRIL 5 MG PO TABS: 5.0000 mg | ORAL_TABLET | Freq: Every day | ORAL | Status: DC

## 2012-01-05 ENCOUNTER — Other Ambulatory Visit: Payer: Self-pay | Admitting: Internal Medicine

## 2012-02-02 ENCOUNTER — Emergency Department (HOSPITAL_COMMUNITY): Payer: Medicare Other

## 2012-02-02 ENCOUNTER — Encounter (HOSPITAL_COMMUNITY): Payer: Self-pay | Admitting: *Deleted

## 2012-02-02 ENCOUNTER — Emergency Department (HOSPITAL_COMMUNITY)
Admission: EM | Admit: 2012-02-02 | Discharge: 2012-02-02 | Disposition: A | Discharge status: Home / Self Care | Payer: Medicare Other | Attending: Emergency Medicine | Admitting: Emergency Medicine

## 2012-02-02 DIAGNOSIS — R509 Fever, unspecified: Secondary | ICD-10-CM | POA: Insufficient documentation

## 2012-02-02 DIAGNOSIS — R059 Cough, unspecified: Secondary | ICD-10-CM | POA: Insufficient documentation

## 2012-02-02 DIAGNOSIS — E785 Hyperlipidemia, unspecified: Secondary | ICD-10-CM | POA: Insufficient documentation

## 2012-02-02 DIAGNOSIS — R42 Dizziness and giddiness: Secondary | ICD-10-CM | POA: Insufficient documentation

## 2012-02-02 DIAGNOSIS — Z8679 Personal history of other diseases of the circulatory system: Secondary | ICD-10-CM | POA: Insufficient documentation

## 2012-02-02 DIAGNOSIS — J40 Bronchitis, not specified as acute or chronic: Secondary | ICD-10-CM

## 2012-02-02 DIAGNOSIS — K219 Gastro-esophageal reflux disease without esophagitis: Secondary | ICD-10-CM | POA: Insufficient documentation

## 2012-02-02 DIAGNOSIS — Z79899 Other long term (current) drug therapy: Secondary | ICD-10-CM | POA: Insufficient documentation

## 2012-02-02 DIAGNOSIS — Z9861 Coronary angioplasty status: Secondary | ICD-10-CM | POA: Insufficient documentation

## 2012-02-02 DIAGNOSIS — I509 Heart failure, unspecified: Secondary | ICD-10-CM | POA: Insufficient documentation

## 2012-02-02 DIAGNOSIS — E119 Type 2 diabetes mellitus without complications: Secondary | ICD-10-CM | POA: Insufficient documentation

## 2012-02-02 DIAGNOSIS — Z8719 Personal history of other diseases of the digestive system: Secondary | ICD-10-CM | POA: Insufficient documentation

## 2012-02-02 DIAGNOSIS — I252 Old myocardial infarction: Secondary | ICD-10-CM | POA: Insufficient documentation

## 2012-02-02 DIAGNOSIS — J4489 Other specified chronic obstructive pulmonary disease: Secondary | ICD-10-CM | POA: Insufficient documentation

## 2012-02-02 DIAGNOSIS — E039 Hypothyroidism, unspecified: Secondary | ICD-10-CM | POA: Insufficient documentation

## 2012-02-02 DIAGNOSIS — R0789 Other chest pain: Secondary | ICD-10-CM | POA: Insufficient documentation

## 2012-02-02 DIAGNOSIS — M545 Low back pain, unspecified: Secondary | ICD-10-CM | POA: Insufficient documentation

## 2012-02-02 DIAGNOSIS — Z8739 Personal history of other diseases of the musculoskeletal system and connective tissue: Secondary | ICD-10-CM | POA: Insufficient documentation

## 2012-02-02 DIAGNOSIS — Z8611 Personal history of tuberculosis: Secondary | ICD-10-CM | POA: Insufficient documentation

## 2012-02-02 DIAGNOSIS — Z8673 Personal history of transient ischemic attack (TIA), and cerebral infarction without residual deficits: Secondary | ICD-10-CM | POA: Insufficient documentation

## 2012-02-02 DIAGNOSIS — I10 Essential (primary) hypertension: Secondary | ICD-10-CM | POA: Insufficient documentation

## 2012-02-02 DIAGNOSIS — R05 Cough: Secondary | ICD-10-CM | POA: Insufficient documentation

## 2012-02-02 DIAGNOSIS — M7989 Other specified soft tissue disorders: Secondary | ICD-10-CM | POA: Insufficient documentation

## 2012-02-02 DIAGNOSIS — Z9851 Tubal ligation status: Secondary | ICD-10-CM | POA: Insufficient documentation

## 2012-02-02 DIAGNOSIS — Z8781 Personal history of (healed) traumatic fracture: Secondary | ICD-10-CM | POA: Insufficient documentation

## 2012-02-02 DIAGNOSIS — F3289 Other specified depressive episodes: Secondary | ICD-10-CM | POA: Insufficient documentation

## 2012-02-02 DIAGNOSIS — F329 Major depressive disorder, single episode, unspecified: Secondary | ICD-10-CM | POA: Insufficient documentation

## 2012-02-02 DIAGNOSIS — Z8701 Personal history of pneumonia (recurrent): Secondary | ICD-10-CM | POA: Insufficient documentation

## 2012-02-02 DIAGNOSIS — Z7902 Long term (current) use of antithrombotics/antiplatelets: Secondary | ICD-10-CM | POA: Insufficient documentation

## 2012-02-02 DIAGNOSIS — Z87891 Personal history of nicotine dependence: Secondary | ICD-10-CM | POA: Insufficient documentation

## 2012-02-02 DIAGNOSIS — J449 Chronic obstructive pulmonary disease, unspecified: Secondary | ICD-10-CM | POA: Insufficient documentation

## 2012-02-02 LAB — PRO B NATRIURETIC PEPTIDE: Pro B Natriuretic peptide (BNP): 17.2 pg/mL (ref 0–125)

## 2012-02-02 LAB — COMPREHENSIVE METABOLIC PANEL
ALT: 6 U/L (ref 0–35)
AST: 10 U/L (ref 0–37)
Albumin: 3.3 g/dL — ABNORMAL LOW (ref 3.5–5.2)
Alkaline Phosphatase: 100 U/L (ref 39–117)
BUN: 8 mg/dL (ref 6–23)
CO2: 25 mEq/L (ref 19–32)
Calcium: 9.5 mg/dL (ref 8.4–10.5)
Chloride: 105 mEq/L (ref 96–112)
Creatinine, Ser: 0.8 mg/dL (ref 0.50–1.10)
GFR calc Af Amer: 82 mL/min — ABNORMAL LOW (ref >90)
GFR calc non Af Amer: 71 mL/min — ABNORMAL LOW (ref >90)
Glucose, Bld: 158 mg/dL — ABNORMAL HIGH (ref 70–99)
Potassium: 3.7 mEq/L (ref 3.5–5.1)
Sodium: 140 mEq/L (ref 135–145)
Total Bilirubin: 0.5 mg/dL (ref 0.3–1.2)
Total Protein: 6.9 g/dL (ref 6.0–8.3)

## 2012-02-02 LAB — CBC
HCT: 31.9 % — ABNORMAL LOW (ref 36.0–46.0)
Hemoglobin: 10.4 g/dL — ABNORMAL LOW (ref 12.0–15.0)
MCH: 28 pg (ref 26.0–34.0)
MCHC: 32.6 g/dL (ref 30.0–36.0)
MCV: 85.8 fL (ref 78.0–100.0)
Platelets: 147 10*3/uL — ABNORMAL LOW (ref 150–400)
RBC: 3.72 MIL/uL — ABNORMAL LOW (ref 3.87–5.11)
RDW: 14.4 % (ref 11.5–15.5)
WBC: 7 10*3/uL (ref 4.0–10.5)

## 2012-02-02 LAB — POCT I-STAT TROPONIN I: Troponin i, poc: 0 ng/mL (ref 0.00–0.08)

## 2012-02-02 MED ORDER — AEROCHAMBER Z-STAT PLUS/MEDIUM MISC
1.0000 | Freq: Once | Status: AC
  Administered 2012-02-02: 1

## 2012-02-02 MED ORDER — ALBUTEROL SULFATE HFA 108 (90 BASE) MCG/ACT IN AERS
2.0000 | INHALATION_SPRAY | RESPIRATORY_TRACT | Status: DC | PRN
  Administered 2012-02-02: 2 via RESPIRATORY_TRACT
  Filled 2012-02-02: qty 6.7

## 2012-02-02 MED ORDER — IPRATROPIUM BROMIDE 0.02 % IN SOLN
0.5000 mg | Freq: Once | RESPIRATORY_TRACT | Status: AC
  Administered 2012-02-02: 0.5 mg via RESPIRATORY_TRACT
  Filled 2012-02-02: qty 2.5

## 2012-02-02 MED ORDER — PREDNISONE 20 MG PO TABS
60.0000 mg | ORAL_TABLET | Freq: Once | ORAL | Status: AC
  Administered 2012-02-02: 60 mg via ORAL
  Filled 2012-02-02: qty 3

## 2012-02-02 MED ORDER — PREDNISONE 20 MG PO TABS: 20.0000 mg | ORAL_TABLET | Freq: Two times a day (BID) | ORAL | Status: DC

## 2012-02-02 MED ORDER — AEROCHAMBER PLUS W/MASK MISC
Status: AC
  Filled 2012-02-02: qty 1

## 2012-02-02 MED ORDER — ALBUTEROL SULFATE (5 MG/ML) 0.5% IN NEBU
5.0000 mg | INHALATION_SOLUTION | Freq: Once | RESPIRATORY_TRACT | Status: AC
  Administered 2012-02-02: 5 mg via RESPIRATORY_TRACT
  Filled 2012-02-02: qty 1

## 2012-02-02 MED ORDER — DOXYCYCLINE HYCLATE 100 MG PO CAPS: 100.0000 mg | ORAL_CAPSULE | Freq: Two times a day (BID) | ORAL | Status: DC

## 2012-02-02 NOTE — ED Notes (Signed)
Pt is here with shortness of breath, leg swelling, fever, cough, and chest pain and symptoms have been going on since Friday
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
## 2012-02-02 NOTE — ED Notes (Signed)
Care transferred and report given to Hessie Diener, [place]

## 2012-02-02 NOTE — ED Notes (Signed)
Placed 2L O2 [place] on patient.  SP02 93%.

## 2012-02-12 ENCOUNTER — Other Ambulatory Visit: Payer: Self-pay | Admitting: *Deleted

## 2012-02-12 DIAGNOSIS — G8929 Other chronic pain: Secondary | ICD-10-CM

## 2012-02-12 NOTE — Telephone Encounter (Signed)
Last filled 12/28/11 

## 2012-02-15 MED ORDER — HYDROCODONE-ACETAMINOPHEN 5-325 MG PO TABS: 1.0000 | ORAL_TABLET | Freq: Three times a day (TID) | ORAL | Status: DC | PRN

## 2012-02-15 NOTE — Telephone Encounter (Signed)
Refill faxed in and pt informed

## 2012-02-15 NOTE — Telephone Encounter (Signed)
On 12/07/11, Dr. Allena Katz Observed that the patient does not have a pain contract. He however, went ahead and approved the refill for the patient's benefit. I notice that it is still missing. I will allow only 20 tablets this time, and would ask the patient to schedule a visit and get her pain reassessed and a pain contract drawn, if she needs chronic pain medication.

## 2012-02-24 ENCOUNTER — Other Ambulatory Visit: Payer: Self-pay | Admitting: Internal Medicine

## 2012-02-25 ENCOUNTER — Other Ambulatory Visit: Payer: Self-pay | Admitting: *Deleted

## 2012-02-25 DIAGNOSIS — I10 Essential (primary) hypertension: Secondary | ICD-10-CM

## 2012-02-27 MED ORDER — CARVEDILOL 25 MG PO TABS: 25.0000 mg | ORAL_TABLET | Freq: Two times a day (BID) | ORAL | Status: DC

## 2012-02-27 MED ORDER — POTASSIUM CHLORIDE 20 MEQ PO PACK: 20.0000 meq | PACK | Freq: Every day | ORAL | Status: DC

## 2012-02-27 MED ORDER — METFORMIN HCL 1000 MG PO TABS: 1000.0000 mg | ORAL_TABLET | Freq: Two times a day (BID) | ORAL | Status: DC

## 2012-02-27 MED ORDER — LEVOTHYROXINE SODIUM 100 MCG PO TABS: 100.0000 ug | ORAL_TABLET | Freq: Every day | ORAL | Status: DC

## 2012-02-27 MED ORDER — AMLODIPINE BESYLATE 10 MG PO TABS: 10.0000 mg | ORAL_TABLET | Freq: Every day | ORAL | Status: DC

## 2012-02-27 MED ORDER — CLOPIDOGREL BISULFATE 75 MG PO TABS: 75.0000 mg | ORAL_TABLET | Freq: Every day | ORAL | Status: DC

## 2012-02-27 MED ORDER — PAROXETINE HCL 20 MG PO TABS: 20.0000 mg | ORAL_TABLET | ORAL | Status: DC

## 2012-03-23 ENCOUNTER — Other Ambulatory Visit: Payer: Self-pay | Admitting: *Deleted

## 2012-03-23 DIAGNOSIS — I10 Essential (primary) hypertension: Secondary | ICD-10-CM

## 2012-03-23 MED ORDER — POTASSIUM CHLORIDE 20 MEQ PO PACK: 20.0000 meq | PACK | Freq: Every day | ORAL | Status: DC

## 2012-03-23 NOTE — Telephone Encounter (Signed)
Rx called in to pharmacy - will cont to use capsule due to insurance.

## 2012-03-24 ENCOUNTER — Encounter: Payer: Self-pay | Admitting: Internal Medicine

## 2012-03-24 ENCOUNTER — Ambulatory Visit (INDEPENDENT_AMBULATORY_CARE_PROVIDER_SITE_OTHER): Payer: Medicare HMO | Admitting: Internal Medicine

## 2012-03-24 VITALS — BP 129/78 | HR 76 | Temp 97.2°F | Wt 213.0 lb

## 2012-03-24 DIAGNOSIS — K219 Gastro-esophageal reflux disease without esophagitis: Secondary | ICD-10-CM

## 2012-03-24 DIAGNOSIS — R32 Unspecified urinary incontinence: Secondary | ICD-10-CM

## 2012-03-24 DIAGNOSIS — R5381 Other malaise: Secondary | ICD-10-CM

## 2012-03-24 DIAGNOSIS — N390 Urinary tract infection, site not specified: Secondary | ICD-10-CM

## 2012-03-24 DIAGNOSIS — R5383 Other fatigue: Secondary | ICD-10-CM

## 2012-03-24 DIAGNOSIS — E119 Type 2 diabetes mellitus without complications: Secondary | ICD-10-CM

## 2012-03-24 DIAGNOSIS — R110 Nausea: Secondary | ICD-10-CM

## 2012-03-24 DIAGNOSIS — M1990 Unspecified osteoarthritis, unspecified site: Secondary | ICD-10-CM

## 2012-03-24 DIAGNOSIS — I10 Essential (primary) hypertension: Secondary | ICD-10-CM

## 2012-03-24 LAB — POCT URINALYSIS DIPSTICK
Bilirubin, UA: NEGATIVE
Blood, UA: NEGATIVE
Glucose, UA: NEGATIVE
Ketones, UA: NEGATIVE
Spec Grav, UA: 1.025
Urobilinogen, UA: 0.2
pH, UA: 5.5

## 2012-03-24 LAB — COMPLETE METABOLIC PANEL WITH GFR
ALT: <8 U/L (ref 0–35)
AST: 13 U/L (ref 0–37)
Albumin: 4.1 g/dL (ref 3.5–5.2)
Alkaline Phosphatase: 102 U/L (ref 39–117)
BUN: 15 mg/dL (ref 6–23)
CO2: 27 mEq/L (ref 19–32)
Calcium: 9.9 mg/dL (ref 8.4–10.5)
Chloride: 108 mEq/L (ref 96–112)
Creat: 0.84 mg/dL (ref 0.50–1.10)
GFR, Est African American: 79 mL/min
GFR, Est Non African American: 69 mL/min
Glucose, Bld: 134 mg/dL — ABNORMAL HIGH (ref 70–99)
Potassium: 4.3 mEq/L (ref 3.5–5.3)
Sodium: 144 mEq/L (ref 135–145)
Total Bilirubin: 0.5 mg/dL (ref 0.3–1.2)
Total Protein: 7 g/dL (ref 6.0–8.3)

## 2012-03-24 LAB — POCT GLYCOSYLATED HEMOGLOBIN (HGB A1C): Hemoglobin A1C: 6.6

## 2012-03-24 LAB — CBC
HCT: 38.1 % (ref 36.0–46.0)
Hemoglobin: 12.3 g/dL (ref 12.0–15.0)
MCH: 28.2 pg (ref 26.0–34.0)
MCHC: 32.3 g/dL (ref 30.0–36.0)
MCV: 87.4 fL (ref 78.0–100.0)
Platelets: 233 10*3/uL (ref 150–400)
RBC: 4.36 MIL/uL (ref 3.87–5.11)
RDW: 15.7 % — ABNORMAL HIGH (ref 11.5–15.5)
WBC: 7.1 10*3/uL (ref 4.0–10.5)

## 2012-03-24 LAB — GLUCOSE, CAPILLARY: Glucose-Capillary: 203 mg/dL — ABNORMAL HIGH (ref 70–99)

## 2012-03-24 MED ORDER — NITROFURANTOIN MONOHYD MACRO 100 MG PO CAPS: 100.0000 mg | ORAL_CAPSULE | Freq: Two times a day (BID) | ORAL | Status: DC

## 2012-03-24 MED ORDER — ONDANSETRON HCL 4 MG PO TABS: 4.0000 mg | ORAL_TABLET | Freq: Three times a day (TID) | ORAL | Status: DC | PRN

## 2012-03-24 MED ORDER — HYDROCODONE-ACETAMINOPHEN 5-300 MG PO TABS: 1.0000 | ORAL_TABLET | Freq: Two times a day (BID) | ORAL | Status: DC

## 2012-03-24 MED ORDER — PHENAZOPYRIDINE HCL 100 MG PO TABS: ORAL_TABLET | ORAL | Status: DC

## 2012-03-24 MED ORDER — RANITIDINE HCL 150 MG PO TABS: 150.0000 mg | ORAL_TABLET | Freq: Two times a day (BID) | ORAL | Status: DC

## 2012-03-24 NOTE — Patient Instructions (Signed)
General Instructions: Please schedule a follow up appointment in 1-2 months . Please bring your medication bottles with your next appointment. Please take your medicines as prescribed. I will call you with your lab results if anything will be abnormal.    Treatment Goals:  Goals (1 Years of Data) as of 03/24/12         As of Today 02/02/12 02/02/12 02/02/12 02/02/12     Blood Pressure    . Blood Pressure < 140/90  129/78 149/72 155/74 142/67 131/67    . Blood Pressure < 140/90  129/78 149/72 155/74 142/67 131/67     Result Component    . HEMOGLOBIN A1C < 7.0  6.6        . HEMOGLOBIN A1C < 7.0  6.6        . LDL CALC < 100          . LDL CALC < 100            Progress Toward Treatment Goals:  Treatment Goal 03/24/2012  Hemoglobin A1C at goal  Blood pressure at goal    Self Care Goals & Plans:  Self Care Goal 03/24/2012  Manage my medications take my medicines as prescribed; refill my medications on time  Monitor my health keep track of my blood glucose; check my feet daily  Eat healthy foods eat foods that are low in salt; drink diet soda or water instead of juice or soda; eat more vegetables  Be physically active find an activity I enjoy       Care Management & Community Referrals:  Referral 03/24/2012  Referrals made for care management support none needed

## 2012-03-24 NOTE — Assessment & Plan Note (Signed)
Hb AIC at goal. She was congratulated on excellent control of her blood sugars. Continue metformin and glipizide. - Foot exam was done today.

## 2012-03-24 NOTE — Assessment & Plan Note (Signed)
She continues to report chronic pain in her knees and was requesting a prescription for Vicodin, that she as been getting off-and-on Vicodin our clinic. She states that she has been running out of that medication for last 1 month . Reviewing the FYI, I made her sign a new contract for 60 tablets of vicodin a month. She was educated on the side effects of narcotics including nausea, vomiting and constipation, addiction potential etc.. She verbalizes understanding.

## 2012-03-24 NOTE — Assessment & Plan Note (Signed)
Blood Pressure at goal. Continue amlodipine, Coreg and lisinopril for now. - Her Lasix was discontinued.

## 2012-03-24 NOTE — Assessment & Plan Note (Addendum)
She has been complaining of chronic fatigue for a year that has been worsening over last few months associated with mild SOB . She has history of OSA but she does not have a CPAP machine as she did not have money to buy at that time ( Sleep study in 11/13 showed severe OSA with AHI of 58.3 per hour). Ambulatory O2 sats were found to be 92% in the clinic. She has history of hypothyroidism and last TSH was a year ago, which was normal. She has normocytic anemia with her baseline around 10.5-11. Denies any recent blood in the stools. Her worsening fatigue is likely secondary to her objective sleep apnea.  -Would send prescription [facility] to [facility] and see if her insurance would cover it. Mamie and myself would work on it.  - ReCheck TSH, CBC
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
## 2012-03-24 NOTE — Assessment & Plan Note (Addendum)
Patient has problems with urinary incontinence and uses pads for that. She reports having increased urinary incontinence lately along with some burning sensation in her vaginal area with some nausea. On dipstick she was found to be positive for nitrites and leukocytes, pointing towards recurrent UTI again. - Treat her with nitrofurnation for 7 days . Previous urine culture reports were reviewed that mostly has grown E. Coli, sensitive to nitrofurantoin.  - Would also give her 2 days of pyridium to help with symptoms of burning.  - F/U urine culture and sensitivity to see if antibiotic needs to be changed.  - Discontinue Lasix, no signs of fluid overload.  - She was again counseled and educated on frequent voluntary voiding to keep the bladder volume low -  Educated on Kegel exercises

## 2012-03-25 LAB — URINALYSIS, ROUTINE W REFLEX MICROSCOPIC
Bilirubin Urine: NEGATIVE
Glucose, UA: NEGATIVE mg/dL
Hgb urine dipstick: NEGATIVE
Ketones, ur: NEGATIVE mg/dL
Nitrite: NEGATIVE
Protein, ur: NEGATIVE mg/dL
Specific Gravity, Urine: 1.02 (ref 1.005–1.030)
Urobilinogen, UA: 0.2 mg/dL (ref 0.0–1.0)
pH: 5 (ref 5.0–8.0)

## 2012-03-25 LAB — URINALYSIS, MICROSCOPIC ONLY
Casts: NONE SEEN
Crystals: NONE SEEN

## 2012-03-26 LAB — URINE CULTURE: Colony Count: >=100000

## 2012-04-20 ENCOUNTER — Other Ambulatory Visit: Payer: Self-pay | Admitting: Internal Medicine

## 2012-05-13 ENCOUNTER — Other Ambulatory Visit: Payer: Self-pay | Admitting: Internal Medicine

## 2012-05-13 DIAGNOSIS — M1990 Unspecified osteoarthritis, unspecified site: Secondary | ICD-10-CM

## 2012-05-24 MED ORDER — ZOLPIDEM TARTRATE 10 MG PO TABS: 10.0000 mg | ORAL_TABLET | Freq: Every evening | ORAL | Status: DC | PRN

## 2012-05-24 MED ORDER — HYDROCODONE-ACETAMINOPHEN 5-300 MG PO TABS: 1.0000 | ORAL_TABLET | Freq: Two times a day (BID) | ORAL | Status: DC

## 2012-05-24 NOTE — Telephone Encounter (Signed)
Last refill on Vicodin was 2/20 # 60

## 2012-05-24 NOTE — Telephone Encounter (Signed)
Rx called in 

## 2012-05-26 ENCOUNTER — Telehealth: Payer: Self-pay | Admitting: *Deleted

## 2012-05-26 NOTE — Telephone Encounter (Signed)
Pt called with c/o yeast infection, vaginal.  Stinging and burning with watery discharge.  Onset Monday 4/21. She states she gets this type of infection on and off .  Treated for this at last visit. She was told it may be her meds that cause this problem. Will you send in antibiotic or do you want her to be seen?

## 2012-05-27 NOTE — Telephone Encounter (Signed)
She needs to be seen as this seems to be treatment failure or re-infection within 2 months. Needs U/A, micro and culture.  Thanks a lot.  Isayah Ignasiak.

## 2012-05-27 NOTE — Telephone Encounter (Signed)
Pt called and scheduled for appointment Monday.  We were unable to see today but pt asked to go to ED if she develops fever of feels any worse.  She voices understanding.

## 2012-05-30 ENCOUNTER — Other Ambulatory Visit (HOSPITAL_COMMUNITY)
Admission: RE | Admit: 2012-05-30 | Discharge: 2012-05-30 | Disposition: A | Discharge status: Home / Self Care | Payer: Medicare HMO | Source: Ambulatory Visit | Attending: Internal Medicine | Admitting: Internal Medicine

## 2012-05-30 ENCOUNTER — Ambulatory Visit (INDEPENDENT_AMBULATORY_CARE_PROVIDER_SITE_OTHER): Payer: Medicare HMO | Admitting: Internal Medicine

## 2012-05-30 ENCOUNTER — Encounter: Payer: Self-pay | Admitting: Internal Medicine

## 2012-05-30 VITALS — BP 125/70 | HR 61 | Temp 97.1°F | Ht 68.5 in | Wt 221.6 lb

## 2012-05-30 DIAGNOSIS — N760 Acute vaginitis: Secondary | ICD-10-CM | POA: Insufficient documentation

## 2012-05-30 DIAGNOSIS — B372 Candidiasis of skin and nail: Secondary | ICD-10-CM

## 2012-05-30 DIAGNOSIS — R300 Dysuria: Secondary | ICD-10-CM

## 2012-05-30 MED ORDER — NYSTATIN 100000 UNIT/GM EX CREA: TOPICAL_CREAM | Freq: Two times a day (BID) | CUTANEOUS | Status: DC

## 2012-05-30 NOTE — Assessment & Plan Note (Addendum)
Assessment: Patient complains of 1 week of vaginitis symptoms. Wet prep was performed today.  Plan:      Await wet prep results, tx accordingly.  ADDENDUM TO PLAN AFTER LABS RESULTED: 05/31/2012, 4:13 PM  Pertinent Data Reviewed: Wet prep (05/30/2012) - positive for gardnerella vaginalis and candida species  Assessment: Patient has candidal infection and bacterial vaginosis. Will call in RX as below. No medication allergies.  Plan:  Flagyl 500mg  BID x 7 days.  Diflucan 150mg  x 1 dose, can repeat in 3 days if symptoms persistent.  The patient was called and informed of the above mentioned results. She was instructed to take the medications as prescribed, and to avoid using alcohol with these medications. She was informed that if she develops any signs or symptoms of medication allergy or intolerance, she should stop the medications immediately and come for reevaluation.  If she continues to have the symptoms in several weeks, she is instructed to return to the clinic to be reevaluated.  Letter also to be sent to patient regarding above results in addition to handout regarding BV.

## 2012-05-30 NOTE — Assessment & Plan Note (Signed)
Pertinent Data Reviewed: Urine culture (03/2012) - E. Coli > 100,000 colonies - pan-sensitive.  Assessment: Patient again complains of dysuria today. No fevers, chills, flank pain to suggest pyelonephritis.  Plan:      Recheck urinalysis + micro.

## 2012-05-30 NOTE — Assessment & Plan Note (Signed)
Assessment: Candidal infection under her pannus. She indicates no relief with nystatin powder. Prior relief with cream.  Plan:      Restart BID nystatin cream.

## 2012-05-30 NOTE — Patient Instructions (Signed)
General Instructions:  Please follow-up at the clinic in 1 month, at which time we will reevaluate your vaginal itching, urinary concerns - OR, please follow-up in the clinic sooner if needed.  There have been changes in your medications:  START using the nystatin cream under your belly for your rash - twice daily  You are getting labs today, if they are abnormal I will give you a call.   If you have been started on new medication(s), and you develop symptoms concerning for allergic reaction, including, but not limited to, throat closing, tongue swelling, rash, please stop the medication immediately and call the clinic at [phone], and go to the ER.  If you are diabetic, please bring your meter to your next visit.  If symptoms worsen, or new symptoms arise, please call the clinic or go to the ER.  PLEASE BRING ALL OF YOUR MEDICATIONS  IN A BAG TO YOUR NEXT APPOINTMENT   Treatment Goals:  Goals (1 Years of Data) as of 05/30/12         As of Today 03/24/12 02/02/12 02/02/12 02/02/12     Blood Pressure    . Blood Pressure < 140/90  125/70 129/78 149/72 155/74 142/67    . Blood Pressure < 140/90  125/70 129/78 149/72 155/74 142/67     Result Component    . HEMOGLOBIN A1C < 7.0   6.6       . HEMOGLOBIN A1C < 7.0   6.6       . LDL CALC < 100          . LDL CALC < 100            Progress Toward Treatment Goals:  Treatment Goal 03/24/2012  Hemoglobin A1C at goal  Blood pressure at goal    Self Care Goals & Plans:  Self Care Goal 03/24/2012  Manage my medications take my medicines as prescribed; refill my medications on time  Monitor my health keep track of my blood glucose; check my feet daily  Eat healthy foods eat foods that are low in salt; drink diet soda or water instead of juice or soda; eat more vegetables  Be physically active find an activity I enjoy       Care Management & Community Referrals:  Referral 03/24/2012  Referrals made for care management support none  needed
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
## 2012-05-31 LAB — URINALYSIS, ROUTINE W REFLEX MICROSCOPIC
Bilirubin Urine: NEGATIVE
Glucose, UA: NEGATIVE mg/dL
Hgb urine dipstick: NEGATIVE
Ketones, ur: NEGATIVE mg/dL
Leukocytes, UA: NEGATIVE
Nitrite: NEGATIVE
Protein, ur: NEGATIVE mg/dL
Specific Gravity, Urine: 1.014 (ref 1.005–1.030)
Urobilinogen, UA: 0.2 mg/dL (ref 0.0–1.0)
pH: 5.5 (ref 5.0–8.0)

## 2012-05-31 MED ORDER — FLUCONAZOLE 150 MG PO TABS: 150.0000 mg | ORAL_TABLET | Freq: Every day | ORAL | Status: AC

## 2012-05-31 MED ORDER — METRONIDAZOLE 500 MG PO TABS: 500.0000 mg | ORAL_TABLET | Freq: Two times a day (BID) | ORAL | Status: DC

## 2012-05-31 NOTE — Addendum Note (Signed)
Addended by: Priscella Mann on: 05/31/2012 04:21 PM   Modules accepted: Orders

## 2012-05-31 NOTE — Progress Notes (Signed)
Quick Note:  TX for yeast infection and BV with flagyl and diflucan. ______

## 2012-06-01 LAB — URINE CULTURE
Colony Count: NO GROWTH
Organism ID, Bacteria: NO GROWTH

## 2012-06-06 NOTE — Progress Notes (Signed)
Case discussed with Dr. Kalia-Reynolds at the time of the visit, immediately after the resident saw the patient.  I reviewed the resident's history and exam and pertinent patient test results.  I agree with the assessment, diagnosis and plan of care documented in the resident's note.   

## 2012-06-08 ENCOUNTER — Encounter: Payer: Medicare Other | Admitting: Internal Medicine

## 2012-06-20 ENCOUNTER — Other Ambulatory Visit: Payer: Self-pay | Admitting: *Deleted

## 2012-06-20 DIAGNOSIS — M1990 Unspecified osteoarthritis, unspecified site: Secondary | ICD-10-CM

## 2012-07-01 MED ORDER — HYDROCODONE-ACETAMINOPHEN 5-300 MG PO TABS: 1.0000 | ORAL_TABLET | Freq: Two times a day (BID) | ORAL | Status: DC

## 2012-07-08 ENCOUNTER — Other Ambulatory Visit (HOSPITAL_COMMUNITY)
Admission: RE | Admit: 2012-07-08 | Discharge: 2012-07-08 | Disposition: A | Discharge status: Home / Self Care | Payer: Medicare HMO | Source: Ambulatory Visit | Attending: Emergency Medicine | Admitting: Emergency Medicine

## 2012-07-08 ENCOUNTER — Emergency Department (HOSPITAL_COMMUNITY)
Admission: EM | Admit: 2012-07-08 | Discharge: 2012-07-08 | Disposition: A | Discharge status: Home / Self Care | Payer: Medicare HMO | Source: Home / Self Care | Attending: Emergency Medicine | Admitting: Emergency Medicine

## 2012-07-08 ENCOUNTER — Encounter (HOSPITAL_COMMUNITY): Payer: Self-pay | Admitting: Emergency Medicine

## 2012-07-08 DIAGNOSIS — N760 Acute vaginitis: Secondary | ICD-10-CM

## 2012-07-08 DIAGNOSIS — J309 Allergic rhinitis, unspecified: Secondary | ICD-10-CM

## 2012-07-08 LAB — POCT URINALYSIS DIP (DEVICE)
Bilirubin Urine: NEGATIVE
Glucose, UA: NEGATIVE mg/dL
Hgb urine dipstick: NEGATIVE
Ketones, ur: NEGATIVE mg/dL
Leukocytes, UA: NEGATIVE
Nitrite: NEGATIVE
Protein, ur: 30 mg/dL — AB
Specific Gravity, Urine: 1.025 (ref 1.005–1.030)
Urobilinogen, UA: 0.2 mg/dL (ref 0.0–1.0)
pH: 5.5 (ref 5.0–8.0)

## 2012-07-08 MED ORDER — LORATADINE 10 MG PO TABS: 10.0000 mg | ORAL_TABLET | Freq: Every day | ORAL | Status: DC

## 2012-07-08 MED ORDER — KETOCONAZOLE 2 % EX CREA: TOPICAL_CREAM | Freq: Every day | CUTANEOUS | Status: DC

## 2012-07-08 MED ORDER — FLUCONAZOLE 150 MG PO TABS: 150.0000 mg | ORAL_TABLET | Freq: Once | ORAL | Status: AC

## 2012-07-08 NOTE — ED Provider Notes (Signed)
Medical screening examination/treatment/procedure(s) were performed by non-physician practitioner and as supervising physician I was immediately available for consultation/collaboration.  Rolando Whitby   Gratia Disla, MD 07/08/12 1226 

## 2012-07-08 NOTE — ED Notes (Signed)
Sent to bathroom 

## 2012-07-08 NOTE — Discharge Instructions (Signed)
Follow up with Outpatient Clinics about your rash on your belly and if you have any further vaginal symptoms.    Vaginitis Vaginitis is an inflammation of the vagina. It can happen when the normal bacteria and yeast in the vagina grow too much. There are different types. Treatment will depend on the type you have. HOME CARE  Take all medicines as told by your doctor.  Keep your vagina area clean and dry. Avoid soap. Rinse the area with water.  Avoid washing and cleaning out the vagina (douching).  Do not use tampons or have sex (intercourse) until your treatment is done.  Wipe from front to back after going to the restroom.  Wear cotton underwear.  Avoid wearing underwear while you sleep until your vaginitis is gone.  Avoid tight pants. Avoid underwear or nylons without a cotton panel.  Take off wet clothing (such as a bathing suit) as soon as you can.  Use mild, unscented products. Avoid fabric softeners and scented:  Feminine sprays.  Laundry detergents.  Tampons.  Soaps or bubble baths.  Practice safe sex and use condoms. GET HELP RIGHT AWAY IF:   You have belly (abdominal) pain.  You have a fever or lasting symptoms for more than 2 3 days.  You have a fever and your symptoms suddenly get worse. MAKE SURE YOU:   Understand these instructions.  Will watch this condition.  Will get help right away if you are not doing well or get worse. Document Released: 04/17/2008 Document Revised: 10/14/2011 Document Reviewed: 07/02/2011 [facility] Patient Information 2014 [place], [place].   Allergic Rhinitis Allergic rhinitis is when the mucous membranes in the nose respond to allergens. Allergens are particles in the air that cause your body to have an allergic reaction. This causes you to release allergic antibodies. Through a chain of events, these eventually cause you to release histamine into the blood stream (hence the use of antihistamines). Although meant to be  protective to the body, it is this release that causes your discomfort, such as frequent sneezing, congestion and an itchy runny nose.  CAUSES  The pollen allergens may come from grasses, trees, and weeds. This is seasonal allergic rhinitis, or "hay fever." Other allergens cause year-round allergic rhinitis (perennial allergic rhinitis) such as house dust mite allergen, pet dander and mold spores.  SYMPTOMS   Nasal stuffiness (congestion).  Runny, itchy nose with sneezing and tearing of the eyes.  There is often an itching of the mouth, eyes and ears. It cannot be cured, but it can be controlled with medications. DIAGNOSIS  If you are unable to determine the offending allergen, skin or blood testing may find it. TREATMENT   Avoid the allergen.  Medications and allergy shots (immunotherapy) can help.  Hay fever may often be treated with antihistamines in pill or nasal spray forms. Antihistamines block the effects of histamine. There are over-the-counter medicines that may help with nasal congestion and swelling around the eyes. Check with your caregiver before taking or giving this medicine. If the treatment above does not work, there are many new medications your caregiver can prescribe. Stronger medications may be used if initial measures are ineffective. Desensitizing injections can be used if medications and avoidance fails. Desensitization is when a patient is given ongoing shots until the body becomes less sensitive to the allergen. Make sure you follow up with your caregiver if problems continue. SEEK MEDICAL CARE IF:   You develop fever (more than 100.5 F (38.1 C).  You develop  a cough that does not stop easily (persistent).  You have shortness of breath.  You start wheezing.  Symptoms interfere with normal daily activities. Document Released: 10/14/2000 Document Revised: 04/13/2011 Document Reviewed: 04/25/2008 [facility] Patient Information 2014 [place], [place].

## 2012-07-08 NOTE — ED Notes (Signed)
Reports recurrent perineal/vaginal itching.  This episode approx one week.  Reported as "stinging" pain
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
## 2012-07-08 NOTE — ED Notes (Signed)
Patient being seen with minor grandchild in same treatment room

## 2012-07-08 NOTE — ED Notes (Signed)
Delay bringing patient to treatment room because patient with aminor grandchild-staff trying to get permission to treat

## 2012-07-12 NOTE — ED Notes (Signed)
Affirm: Candida pos., Gardnerella and Trich neg.  Pt. adequately treated with Diflucan. Vassie Moselle 07/12/2012

## 2012-07-26 NOTE — Telephone Encounter (Signed)
Checked in [place] and with pharmacy - no record of med called in to pharmacy. Rx called in to pharmacy.

## 2012-08-10 ENCOUNTER — Other Ambulatory Visit: Payer: Self-pay | Admitting: Internal Medicine

## 2012-08-11 ENCOUNTER — Other Ambulatory Visit: Payer: Self-pay

## 2012-08-11 ENCOUNTER — Encounter: Payer: Self-pay | Admitting: Internal Medicine

## 2012-08-11 MED ORDER — HYDROCODONE-ACETAMINOPHEN 5-300 MG PO TABS: 1.0000 | ORAL_TABLET | Freq: Two times a day (BID) | ORAL | Status: DC | PRN

## 2012-08-11 NOTE — Telephone Encounter (Signed)
Refills approved - nurse to call in.  Patient needs an appointment with her PCP, and the indication for odansetron needs to be addressed as part of the visit.  There is a Controlled Medication Contract for the hydrocodone/acetaminophen signed in February 2014.

## 2012-08-16 ENCOUNTER — Other Ambulatory Visit: Payer: Self-pay | Admitting: Internal Medicine

## 2012-08-16 NOTE — Telephone Encounter (Deleted)
Please deny, not IM patient.

## 2012-08-17 NOTE — Telephone Encounter (Signed)
Please sch appt Dr Madelynn Done Aug or Sept routine appt CC

## 2012-08-19 NOTE — Telephone Encounter (Signed)
Called to pharm 

## 2012-09-02 ENCOUNTER — Encounter: Payer: Self-pay | Admitting: Internal Medicine

## 2012-09-02 ENCOUNTER — Ambulatory Visit (INDEPENDENT_AMBULATORY_CARE_PROVIDER_SITE_OTHER): Payer: Medicare HMO | Admitting: Internal Medicine

## 2012-09-02 VITALS — BP 131/75 | HR 67 | Temp 97.5°F | Ht 68.0 in | Wt 219.6 lb

## 2012-09-02 DIAGNOSIS — R300 Dysuria: Secondary | ICD-10-CM

## 2012-09-02 DIAGNOSIS — H61892 Other specified disorders of left external ear: Secondary | ICD-10-CM

## 2012-09-02 DIAGNOSIS — I10 Essential (primary) hypertension: Secondary | ICD-10-CM

## 2012-09-02 DIAGNOSIS — H61899 Other specified disorders of external ear, unspecified ear: Secondary | ICD-10-CM | POA: Insufficient documentation

## 2012-09-02 DIAGNOSIS — N760 Acute vaginitis: Secondary | ICD-10-CM

## 2012-09-02 DIAGNOSIS — E119 Type 2 diabetes mellitus without complications: Secondary | ICD-10-CM

## 2012-09-02 DIAGNOSIS — L301 Dyshidrosis [pompholyx]: Secondary | ICD-10-CM

## 2012-09-02 LAB — GLUCOSE, CAPILLARY: Glucose-Capillary: 167 mg/dL — ABNORMAL HIGH (ref 70–99)

## 2012-09-02 LAB — POCT GLYCOSYLATED HEMOGLOBIN (HGB A1C): Hemoglobin A1C: 6.5

## 2012-09-02 MED ORDER — FLUCONAZOLE 100 MG PO TABS: ORAL_TABLET | ORAL | Status: DC

## 2012-09-02 MED ORDER — HYDROCORTISONE-ACETIC ACID 1-2 % OT SOLN: 3.0000 [drp] | Freq: Two times a day (BID) | OTIC | Status: DC

## 2012-09-02 NOTE — Assessment & Plan Note (Addendum)
Assessment: Pt with chronic left ear itchiness, this is a daily problem for her but was improved when she was previously using Cortisporin otic solution.  No pain or erythema on exam though external part of ear canal appeared dry with some flaking skin. Pt prefers to try new ear drops instead of restarting loratadine.   Plan:  -will try acetic acid-hydrocortisone otic solution for symptomatic relief -re-evaluate at follow-up appt

## 2012-09-02 NOTE — Patient Instructions (Addendum)
Please see me in 3-6 months to follow-up!  Candida Infection, Adult A candida infection (also called yeast, fungus and Monilia infection) is an overgrowth of yeast that can occur anywhere on the body. A yeast infection commonly occurs in warm, moist body areas. Usually, the infection remains localized but can spread to become a systemic infection. A yeast infection may be a sign of a more severe disease such as diabetes, leukemia, or AIDS. A yeast infection can occur in both men and women. In women, Candida vaginitis is a vaginal infection. It is one of the most common causes of vaginitis. Men usually do not have symptoms or know they have an infection until other problems develop. Men may find out they have a yeast infection because their sex partner has a yeast infection. Uncircumcised men are more likely to get a yeast infection than circumcised men. This is because the uncircumcised glans is not exposed to air and does not remain as dry as that of a circumcised glans. Older adults may develop yeast infections around dentures. CAUSES  Women  Antibiotics.  Steroid medication taken for a long time.  Being overweight (obese).  Diabetes.  Poor immune condition.  Certain serious medical conditions.  Immune suppressive medications for organ transplant patients.  Chemotherapy.  Pregnancy.  Menstration.  Stress and fatigue.  Intravenous drug use.  Oral contraceptives.  Wearing tight-fitting clothes in the crotch area.  Catching it from a sex partner who has a yeast infection.  Spermicide.  Intravenous, urinary, or other catheters. Men  Catching it from a sex partner who has a yeast infection.  Having oral or anal sex with a person who has the infection.  Spermicide.  Diabetes.  Antibiotics.  Poor immune system.  Medications that suppress the immune system.  Intravenous drug use.  Intravenous, urinary, or other catheters. SYMPTOMS  Women  Thick, white vaginal  discharge.  Vaginal itching.  Redness and swelling in and around the vagina.  Irritation of the lips of the vagina and perineum.  Blisters on the vaginal lips and perineum.  Painful sexual intercourse.  Low blood sugar (hypoglycemia).  Painful urination.  Bladder infections.  Intestinal problems such as constipation, indigestion, bad breath, bloating, increase in gas, diarrhea, or loose stools. Men  Men may develop intestinal problems such as constipation, indigestion, bad breath, bloating, increase in gas, diarrhea, or loose stools.  Dry, cracked skin on the penis with itching or discomfort.  Jock itch.  Dry, flaky skin.  Athlete's foot.  Hypoglycemia. DIAGNOSIS  Women  A history and an exam are performed.  The discharge may be examined under a microscope.  A culture may be taken of the discharge. Men  A history and an exam are performed.  Any discharge from the penis or areas of cracked skin will be looked at under the microscope and cultured.  Stool samples may be cultured. TREATMENT  Women  Vaginal antifungal suppositories and creams.  Medicated creams to decrease irritation and itching on the outside of the vagina.  Warm compresses to the perineal area to decrease swelling and discomfort.  Oral antifungal medications.  Medicated vaginal suppositories or cream for repeated or recurrent infections.  Wash and dry the irritation areas before applying the cream.  Eating yogurt with lactobacillus may help with prevention and treatment.  Sometimes painting the vagina with gentian violet solution may help if creams and suppositories do not work. Men  Antifungal creams and oral antifungal medications.  Sometimes treatment must continue for 30 days after  the symptoms go away to prevent recurrence. HOME CARE INSTRUCTIONS  Women  Use cotton underwear and avoid tight-fitting clothing.  Avoid colored, scented toilet paper and deodorant tampons or  pads.  Do not douche.  Keep your diabetes under control.  Finish all the prescribed medications.  Keep your skin clean and dry.  Consume milk or yogurt with lactobacillus active culture regularly. If you get frequent yeast infections and think that is what the infection is, there are over-the-counter medications that you can get. If the infection does not show healing in 3 days, talk to your caregiver.  Tell your sex partner you have a yeast infection. Your partner may need treatment also, especially if your infection does not clear up or recurs. Men  Keep your skin clean and dry.  Keep your diabetes under control.  Finish all prescribed medications.  Tell your sex partner that you have a yeast infection so they can be treated if necessary. SEEK MEDICAL CARE IF:   Your symptoms do not clear up or worsen in one week after treatment.  You have an oral temperature above 102 F (38.9 C).  You have trouble swallowing or eating for a prolonged time.  You develop blisters on and around your vagina.  You develop vaginal bleeding and it is not your menstrual period.  You develop abdominal pain.  You develop intestinal problems as mentioned above.  You get weak or lightheaded.  You have painful or increased urination.  You have pain during sexual intercourse. MAKE SURE YOU:   Understand these instructions.  Will watch your condition.  Will get help right away if you are not doing well or get worse. Document Released: 02/27/2004 Document Revised: 04/13/2011 Document Reviewed: 06/10/2009 [facility] Patient Information 2014 [place], [place].

## 2012-09-02 NOTE — Assessment & Plan Note (Signed)
Assessment: BP 131/75 today, at goal. Pt is taking Lasix 20mg  prn when her legs are swollen. Plan: continue amlodipine, carvedilol, and lisinopril.  Pt can continue using low dose furosemide prn.
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
## 2012-09-02 NOTE — Assessment & Plan Note (Signed)
Assessment: A1C 6.5% on 09/02/12. Well controlled on oral medications only (metformin and glipizide).  Pt does not check her blood sugars at home.   Plan: -continue current regimen  -A1C at next visit -Urine microalbumin:creatinine ratio today -will follow-up with Dr. Mitzi Davenport for eye exam

## 2012-09-02 NOTE — Assessment & Plan Note (Addendum)
Assessment:  Recurrent candidal vaginitis x 1 year.  Last seen in ED approximately 2 months ago (Candida +, Gardnerella -, Trichomonas - at that time), given two doses of fluconazole, each of which only provided temporary relief.  No risk factors- diabetes well controlled, not on estrogen, no douching, no pantyliners or pantyhose.  Will initiate induction therapy followed by suppressive therapy per [facility] recs.   Plan: -fluconazole 150mg  daily for 10 days followed by fluconazole 150mg  weekly for 6 months -follow-up in 3-6 months; if not resolved, will consider referral to urogynecology given evidence of prolapse per ED note in June

## 2012-09-02 NOTE — Assessment & Plan Note (Addendum)
Assessment: Pt with hypertrophic plaques of her bilateral forefingers with secondary changes and cracking c/w dyshidrotic eczema.   Plan: -encouraged pt to purchase Cortaid OTC and apply to affected areas daily as well as use emollients

## 2012-09-03 LAB — MICROALBUMIN / CREATININE URINE RATIO
Creatinine, Urine: 102.6 mg/dL
Microalb Creat Ratio: 6.1 mg/g (ref 0.0–30.0)
Microalb, Ur: 0.63 mg/dL (ref 0.00–1.89)

## 2012-09-05 NOTE — Progress Notes (Signed)
I saw and evaluated the patient.  I personally confirmed the key portions of the history and exam documented by Dr. Rogers and I reviewed pertinent patient test results.  The assessment, diagnosis, and plan were formulated together and I agree with the documentation in the resident's note. 

## 2012-09-13 ENCOUNTER — Other Ambulatory Visit: Payer: Self-pay | Admitting: Internal Medicine

## 2012-09-13 DIAGNOSIS — I5032 Chronic diastolic (congestive) heart failure: Secondary | ICD-10-CM

## 2012-09-13 MED ORDER — FUROSEMIDE 20 MG PO TABS: 20.0000 mg | ORAL_TABLET | Freq: Every day | ORAL | Status: DC | PRN

## 2012-09-13 NOTE — Telephone Encounter (Signed)
Done! She should only be taking one tablet daily as needed for leg swelling. Thanks, Lequita Halt

## 2012-09-20 ENCOUNTER — Other Ambulatory Visit: Payer: Self-pay | Admitting: *Deleted

## 2012-09-21 MED ORDER — HYDROCODONE-ACETAMINOPHEN 5-300 MG PO TABS: 1.0000 | ORAL_TABLET | Freq: Two times a day (BID) | ORAL | Status: DC | PRN

## 2012-09-21 NOTE — Telephone Encounter (Signed)
Vicodin 5/300mg  rx called to [facility] pharmacy. Talked to pt; informed her rx refill for #30 tabs and to use Tylenol/ice per Dr Aundria Rud.  Pt stated she will call back to schedule an appt to discuss pain med/problem with arthritis.

## 2012-09-21 NOTE — Telephone Encounter (Signed)
I have called pt with no answer x 3 to discuss her pain and need for this medication.  I just saw her earlier this month and pain was not a concern.  Will refill 30 tablets for now, she can take Tylenol, use ice for breakthrough pain.  If we can not get in touch by phone, will need to make her an appt to discuss pain. Thank you!

## 2012-10-11 ENCOUNTER — Other Ambulatory Visit: Payer: Self-pay | Admitting: Internal Medicine

## 2012-10-12 MED ORDER — NYSTATIN 100000 UNIT/GM EX CREA: TOPICAL_CREAM | Freq: Two times a day (BID) | CUTANEOUS | Status: DC

## 2012-11-08 ENCOUNTER — Other Ambulatory Visit: Payer: Self-pay | Admitting: Internal Medicine

## 2012-11-09 ENCOUNTER — Other Ambulatory Visit: Payer: Self-pay | Admitting: *Deleted

## 2012-11-09 MED ORDER — GLIPIZIDE ER 5 MG PO TB24: 5.0000 mg | ORAL_TABLET | Freq: Every day | ORAL | Status: DC

## 2012-11-09 NOTE — Telephone Encounter (Signed)
They also wanted to make you aware of fda dosing suggestions about zolpidem and that 5mg  for women and elderly are suggested

## 2012-11-09 NOTE — Telephone Encounter (Signed)
PPA asks if pt needs both ranitidine and omeprazole, if so or if its just one they are requesting refill(s). Please advise

## 2012-11-09 NOTE — Telephone Encounter (Signed)
Omeprazole is scheduled, ranitidine is prn, but I will discuss discontinuing ranitidine at patient's next visit with me on 1/9.  Will also try to decrease dose of Ambien to 5mg  at next visit.

## 2012-11-16 ENCOUNTER — Other Ambulatory Visit: Payer: Self-pay | Admitting: Internal Medicine

## 2012-11-16 NOTE — Telephone Encounter (Signed)
Pt was given enough of this medication in August to last her through 03/2013.  She was supposed to be taking fluconazole one tablet/week.   She has an follow-up appointment with me on 02/17/13.

## 2012-12-01 ENCOUNTER — Other Ambulatory Visit: Payer: Self-pay | Admitting: *Deleted

## 2012-12-01 MED ORDER — ZOLPIDEM TARTRATE 5 MG PO TABS: 5.0000 mg | ORAL_TABLET | Freq: Every evening | ORAL | Status: DC | PRN

## 2012-12-01 NOTE — Telephone Encounter (Signed)
Rx faxed in.

## 2012-12-08 ENCOUNTER — Ambulatory Visit (INDEPENDENT_AMBULATORY_CARE_PROVIDER_SITE_OTHER): Payer: Medicare HMO | Admitting: *Deleted

## 2012-12-08 DIAGNOSIS — Z23 Encounter for immunization: Secondary | ICD-10-CM

## 2012-12-27 ENCOUNTER — Other Ambulatory Visit: Payer: Self-pay | Admitting: Internal Medicine

## 2013-01-04 ENCOUNTER — Other Ambulatory Visit: Payer: Self-pay | Admitting: *Deleted

## 2013-01-05 MED ORDER — LISINOPRIL 5 MG PO TABS: 5.0000 mg | ORAL_TABLET | Freq: Every day | ORAL | Status: DC

## 2013-01-13 ENCOUNTER — Other Ambulatory Visit: Payer: Self-pay | Admitting: Internal Medicine

## 2013-01-13 NOTE — Telephone Encounter (Signed)
Caitlyn Aguirre should not need a refill on this medication.  I gave her a 6 month supply when I saw her last summer.  She has an appointment with me on 1/16 and we can address this at that time.

## 2013-02-08 ENCOUNTER — Encounter (HOSPITAL_COMMUNITY): Payer: Self-pay | Admitting: Emergency Medicine

## 2013-02-08 ENCOUNTER — Emergency Department (INDEPENDENT_AMBULATORY_CARE_PROVIDER_SITE_OTHER): Payer: Medicare HMO

## 2013-02-08 ENCOUNTER — Emergency Department (INDEPENDENT_AMBULATORY_CARE_PROVIDER_SITE_OTHER)
Admission: EM | Admit: 2013-02-08 | Discharge: 2013-02-08 | Disposition: A | Discharge status: Home / Self Care | Payer: Medicare HMO | Source: Home / Self Care | Attending: Emergency Medicine | Admitting: Emergency Medicine

## 2013-02-08 DIAGNOSIS — J209 Acute bronchitis, unspecified: Secondary | ICD-10-CM

## 2013-02-08 LAB — POCT I-STAT, CHEM 8
BUN: 9 mg/dL (ref 6–23)
Calcium, Ion: 1.4 mmol/L — ABNORMAL HIGH (ref 1.13–1.30)
Chloride: 110 mEq/L (ref 96–112)
Creatinine, Ser: 0.9 mg/dL (ref 0.50–1.10)
Glucose, Bld: 105 mg/dL — ABNORMAL HIGH (ref 70–99)
HCT: 40 % (ref 36.0–46.0)
Hemoglobin: 13.6 g/dL (ref 12.0–15.0)
Potassium: 4.1 mEq/L (ref 3.7–5.3)
Sodium: 145 mEq/L (ref 137–147)
TCO2: 25 mmol/L (ref 0–100)

## 2013-02-08 LAB — POCT URINALYSIS DIP (DEVICE)
Bilirubin Urine: NEGATIVE
Glucose, UA: NEGATIVE mg/dL
Hgb urine dipstick: NEGATIVE
Ketones, ur: NEGATIVE mg/dL
Leukocytes, UA: NEGATIVE
Nitrite: NEGATIVE
Protein, ur: NEGATIVE mg/dL
Specific Gravity, Urine: 1.02 (ref 1.005–1.030)
Urobilinogen, UA: 0.2 mg/dL (ref 0.0–1.0)
pH: 6 (ref 5.0–8.0)

## 2013-02-08 MED ORDER — BENZONATATE 200 MG PO CAPS: 200.0000 mg | ORAL_CAPSULE | Freq: Three times a day (TID) | ORAL | Status: DC | PRN

## 2013-02-08 MED ORDER — ACETAMINOPHEN 325 MG PO TABS
ORAL_TABLET | ORAL | Status: AC
  Filled 2013-02-08: qty 2

## 2013-02-08 MED ORDER — ACETAMINOPHEN 325 MG PO TABS
650.0000 mg | ORAL_TABLET | Freq: Once | ORAL | Status: AC
  Administered 2013-02-08: 650 mg via ORAL

## 2013-02-08 MED ORDER — IPRATROPIUM BROMIDE 0.06 % NA SOLN: 2.0000 | Freq: Four times a day (QID) | NASAL | Status: DC

## 2013-02-08 MED ORDER — CEFDINIR 300 MG PO CAPS: 300.0000 mg | ORAL_CAPSULE | Freq: Two times a day (BID) | ORAL | Status: DC

## 2013-02-08 NOTE — ED Provider Notes (Addendum)
Chief Complaint   Chief Complaint  Patient presents with  . URI    History of Present Illness   Caitlyn Aguirre is a 78 year old female with multiple medical comorbidities including diabetes, hypertension, history of myocardial infarction, and CHF. She presents with a five-day history of cough productive yellow sputum, shortness of breath, wheezing, weakness, chills, anorexia, nausea, vomiting, crampy abdominal pain, headache, nasal congestion, rhinorrhea, watery eyes, and sore throat. She also has some dysuria, frequency, and urgency.  Review of Systems   Other than as noted above, the patient denies any of the following symptoms: Systemic:  No fevers, chills, sweats, or myalgias. Eye:  No redness or discharge. ENT:  No ear pain, headache, nasal congestion, drainage, sinus pressure, or sore throat. Neck:  No neck pain, stiffness, or swollen glands. Lungs:  No cough, sputum production, hemoptysis, wheezing, chest tightness, shortness of breath or chest pain. GI:  No abdominal pain, nausea, vomiting or diarrhea.  Waterproof   Past medical history, family history, social history, meds, and allergies were reviewed. She has no medication allergies. Current meds include Norvasc, carvedilol, Plavix, Diflucan, Flovent, Lasix, Glucotrol XL, hydrocodone, Prilosec, Paxil, potassium chloride, pro-air, ranitidine, Zocor, Ambien, ibuprofen, Synthroid, lisinopril, Claritin, and metformin. She has a history of COPD, gastroesophageal reflux, hypertension, depression, polymyalgia rheumatica, bronchiectasis, idiopathic cardiomyopathy, dyslipidemia, stroke, hypothyroidism, myocardial infarction, osteoarthritis, congestive heart failure, angina, treated tuberculosis, pneumonia, type 2 diabetes, and chronic lower back pain.  Physical exam   Vital signs:  BP 161/77  Pulse 96  Temp(Src) 98.3 F (36.8 C) (Oral)  Resp 16  SpO2 96% General:  Alert and oriented.  In no distress.  Skin warm and dry. Eye:  No  conjunctival injection or drainage. Lids were normal. ENT:  TMs and canals were normal, without erythema or inflammation.  Nasal mucosa was clear and uncongested, without drainage.  Mucous membranes were moist.  Pharynx was clear with no exudate or drainage.  There were no oral ulcerations or lesions. Neck:  Supple, no adenopathy, tenderness or mass. Lungs:  No respiratory distress.  Lungs were clear to auscultation, without wheezes, rales or rhonchi.  Breath sounds were clear and equal bilaterally.  Heart:  Regular rhythm, without gallops, murmers or rubs. Skin:  Clear, warm, and dry, without rash or lesions.   Labs   Results for orders placed during the hospital encounter of 02/08/13  POCT URINALYSIS DIP (DEVICE)      Result Value Range   Glucose, UA NEGATIVE  NEGATIVE mg/dL   Bilirubin Urine NEGATIVE  NEGATIVE   Ketones, ur NEGATIVE  NEGATIVE mg/dL   Specific Gravity, Urine 1.020  1.005 - 1.030   Hgb urine dipstick NEGATIVE  NEGATIVE   pH 6.0  5.0 - 8.0   Protein, ur NEGATIVE  NEGATIVE mg/dL   Urobilinogen, UA 0.2  0.0 - 1.0 mg/dL   Nitrite NEGATIVE  NEGATIVE   Leukocytes, UA NEGATIVE  NEGATIVE  POCT I-STAT, CHEM 8      Result Value Range   Sodium 145  137 - 147 mEq/L   Potassium 4.1  3.7 - 5.3 mEq/L   Chloride 110  96 - 112 mEq/L   BUN 9  6 - 23 mg/dL   Creatinine, Ser 0.90  0.50 - 1.10 mg/dL   Glucose, Bld 105 (*) 70 - 99 mg/dL   Calcium, Ion 1.40 (*) 1.13 - 1.30 mmol/L   TCO2 25  0 - 100 mmol/L   Hemoglobin 13.6  12.0 - 15.0 g/dL   HCT 40.0  36.0 - 46.0 %    Radiology   Dg Chest 2 View  02/08/2013   CLINICAL DATA:  Cough, shortness of breath, chest discomfort, vomiting, weakness, hypertension, diabetes, coronary artery disease post MI and PTCA/stenting  EXAM: CHEST  2 VIEW  COMPARISON:  02/02/2012  FINDINGS: Enlargement of cardiac silhouette.  Tortuous aorta.  Prominent central pulmonary arteries unchanged.  Bibasilar atelectasis greater on left.  Remaining lungs clear.   No pleural effusion or pneumothorax.  Multilevel endplate spur formation thoracic spine.  IMPRESSION: Enlargement of cardiac silhouette.  Prominent central pulmonary arteries, raising question of pulmonary arterial hypertension.  Bibasilar atelectasis.   Electronically Signed   By: Lavonia Dana M.D.   On: 02/08/2013 18:38   EKG Results    Date: 02/08/2013  Rate: 69  Rhythm: normal sinus rhythm  QRS Axis: normal  Intervals: normal  ST/T Wave abnormalities: normal  Conduction Disutrbances:none  Narrative Interpretation: Normal sinus rhythm, normal EKG.  Old EKG Reviewed: none available  Assessment     The encounter diagnosis was Acute bronchitis.  Plan    1.  Meds:  The following meds were prescribed:   Discharge Medication List as of 02/08/2013  6:52 PM    START taking these medications   Details  benzonatate (TESSALON) 200 MG capsule Take 1 capsule (200 mg total) by mouth 3 (three) times daily as needed for cough., Starting 02/08/2013, Until Discontinued, Normal    cefdinir (OMNICEF) 300 MG capsule Take 1 capsule (300 mg total) by mouth 2 (two) times daily., Starting 02/08/2013, Until Discontinued, Normal    ipratropium (ATROVENT) 0.06 % nasal spray Place 2 sprays into both nostrils 4 (four) times daily., Starting 02/08/2013, Until Discontinued, Normal        2.  Patient Education/Counseling:  The patient was given appropriate handouts, self care instructions, and instructed in symptomatic relief.  Instructed to get extra fluids, rest, and use a cool mist vaporizer.  She will continue to use her albuterol and Flovent inhalers.  3.  Follow up:  The patient was told to follow up here if no better in 3 to 4 days, or sooner if becoming worse in any way, and given some red flag symptoms such as increasing fever, difficulty breathing, chest pain, or persistent vomiting which would prompt immediate return.  Follow up here as needed.      Harden Mo, MD 02/08/13 2157   Addendum:  [facility] are on backorder, so a prescription for Tussionex suspension, 4 ounces, 1 teaspoon every 12 hours written for the patient and she will come by and pick it up.  Harden Mo, MD 02/13/13 [phone]

## 2013-02-08 NOTE — ED Notes (Signed)
Pt c/o cold sxs onset 5 days w/sxs that include: SOB, chest d/c due to cough, HA, vomiting, weakness Also c/o poss yeast infection w/some dysuria Denies: f/d Alert and talking in complete sentences w/no signs of acute distress.

## 2013-02-08 NOTE — Discharge Instructions (Signed)
Acute Bronchitis Bronchitis is inflammation of the airways that extend from the windpipe into the lungs (bronchi). The inflammation often causes mucus to develop. This leads to a cough, which is the most common symptom of bronchitis.  In acute bronchitis, the condition usually develops suddenly and goes away over time, usually in a couple weeks. Smoking, allergies, and asthma can make bronchitis worse. Repeated episodes of bronchitis may cause further lung problems.  CAUSES Acute bronchitis is most often caused by the same virus that causes a cold. The virus can spread from person to person (contagious).  SIGNS AND SYMPTOMS   Cough.   Fever.   Coughing up mucus.   Body aches.   Chest congestion.   Chills.   Shortness of breath.   Sore throat.  DIAGNOSIS  Acute bronchitis is usually diagnosed through a physical exam. Tests, such as chest X-rays, are sometimes done to rule out other conditions.  TREATMENT  Acute bronchitis usually goes away in a couple weeks. Often times, no medical treatment is necessary. Medicines are sometimes given for relief of fever or cough. Antibiotics are usually not needed but may be prescribed in certain situations. In some cases, an inhaler may be recommended to help reduce shortness of breath and control the cough. A cool mist vaporizer may also be used to help thin bronchial secretions and make it easier to clear the chest.  HOME CARE INSTRUCTIONS  Get plenty of rest.   Drink enough fluids to keep your urine clear or pale yellow (unless you have a medical condition that requires fluid restriction). Increasing fluids may help thin your secretions and will prevent dehydration.   Only take over-the-counter or prescription medicines as directed by your health care provider.   Avoid smoking and secondhand smoke. Exposure to cigarette smoke or irritating chemicals will make bronchitis worse. If you are a smoker, consider using nicotine gum or skin  patches to help control withdrawal symptoms. Quitting smoking will help your lungs heal faster.   Reduce the chances of another bout of acute bronchitis by washing your hands frequently, avoiding people with cold symptoms, and trying not to touch your hands to your mouth, nose, or eyes.   Follow up with your health care provider as directed.  SEEK MEDICAL CARE IF: Your symptoms do not improve after 1 week of treatment.  SEEK IMMEDIATE MEDICAL CARE IF:  You develop an increased fever or chills.   You have chest pain.   You have severe shortness of breath.  You have bloody sputum.   You develop dehydration.  You develop fainting.  You develop repeated vomiting.  You develop a severe headache. MAKE SURE YOU:   Understand these instructions.  Will watch your condition.  Will get help right away if you are not doing well or get worse. Document Released: 02/27/2004 Document Revised: 09/21/2012 Document Reviewed: 07/12/2012 [employer] Patient Information 2014 [employer].  

## 2013-02-10 ENCOUNTER — Encounter: Payer: Medicare HMO | Admitting: Internal Medicine

## 2013-02-13 MED ORDER — HYDROCOD POLST-CHLORPHEN POLST 10-8 MG/5ML PO LQCR: 5.0000 mL | Freq: Two times a day (BID) | ORAL | Status: DC | PRN

## 2013-02-13 NOTE — ED Notes (Signed)
Patient called to say the pharmacy cannot supply her tessalon Rx per nation recall/shortage. Spoke w Dr Jake Michaelis, who has written a new Rx for cough the pt will need to retrieve at desk. Patient notified

## 2013-02-17 ENCOUNTER — Ambulatory Visit (INDEPENDENT_AMBULATORY_CARE_PROVIDER_SITE_OTHER): Payer: Medicare HMO | Admitting: Internal Medicine

## 2013-02-17 ENCOUNTER — Encounter: Payer: Self-pay | Admitting: Internal Medicine

## 2013-02-17 VITALS — BP 114/68 | HR 66 | Temp 96.9°F | Ht 68.0 in | Wt 220.4 lb

## 2013-02-17 DIAGNOSIS — E119 Type 2 diabetes mellitus without complications: Secondary | ICD-10-CM

## 2013-02-17 DIAGNOSIS — N393 Stress incontinence (female) (male): Secondary | ICD-10-CM

## 2013-02-17 DIAGNOSIS — H61899 Other specified disorders of external ear, unspecified ear: Secondary | ICD-10-CM

## 2013-02-17 DIAGNOSIS — N760 Acute vaginitis: Secondary | ICD-10-CM

## 2013-02-17 DIAGNOSIS — Z299 Encounter for prophylactic measures, unspecified: Secondary | ICD-10-CM

## 2013-02-17 DIAGNOSIS — E785 Hyperlipidemia, unspecified: Secondary | ICD-10-CM

## 2013-02-17 DIAGNOSIS — E039 Hypothyroidism, unspecified: Secondary | ICD-10-CM

## 2013-02-17 DIAGNOSIS — I10 Essential (primary) hypertension: Secondary | ICD-10-CM

## 2013-02-17 DIAGNOSIS — N39490 Overflow incontinence: Secondary | ICD-10-CM

## 2013-02-17 LAB — POCT GLYCOSYLATED HEMOGLOBIN (HGB A1C): Hemoglobin A1C: 6.8

## 2013-02-17 LAB — GLUCOSE, CAPILLARY: Glucose-Capillary: 129 mg/dL — ABNORMAL HIGH (ref 70–99)

## 2013-02-17 LAB — HM DIABETES EYE EXAM

## 2013-02-17 MED ORDER — DEXTROMETHORPHAN POLISTIREX 30 MG/5ML PO LQCR: 15.0000 mg | Freq: Two times a day (BID) | ORAL | Status: DC | PRN

## 2013-02-17 NOTE — Assessment & Plan Note (Signed)
Patient using pads around the clock due to frequent incontinence.  She denies urinary symptoms and had clean UA at urgent care visit last week.  We talked about timed voiding which she will try.  Also referred her to urogynecology (appt on 1/29) since this has been a chronic issue and given prolapse seen on previous pelvic exam.

## 2013-02-17 NOTE — Assessment & Plan Note (Signed)
Improved, using otic solution with relief.

## 2013-02-17 NOTE — Patient Instructions (Signed)
Keep up the good work on your medicines!  You may buy DELSUM for your cough.   We are checking several lab tests today.  I will call you if the results are abnormal.    We are referring you to the urogynecologist to have your urinary problems addressed.  They will call you with the appointment time.   We are also referring you to have your mammogram and colonoscopy done.    Urinary Incontinence Urinary incontinence is the involuntary loss of urine from your bladder. CAUSES  There are many causes of urinary incontinence. They include:  Medicines.  Infections.  Prostatic enlargement, leading to overflow of urine from your bladder.  Surgery.  Neurological diseases.  Emotional factors. SIGNS AND SYMPTOMS Urinary Incontinence can be divided into four types: 1. Urge incontinence. Urge incontinence is the involuntary loss of urine before you have the opportunity to go to the bathroom. There is a sudden urge to void but not enough time to reach a bathroom. 2. Stress incontinence. Stress incontinence is the sudden loss of urine with any activity that forces urine to pass. It is commonly caused by anatomical changes to the pelvis and sphincter areas of your body. 3. Overflow incontinence. Overflow incontinence is the loss of urine from an obstructed opening to your bladder. This results in a backup of urine and a resultant buildup of pressure within the bladder. When the pressure within the bladder exceeds the closing pressure of the sphincter, the urine overflows, which causes incontinence, similar to water overflowing a dam. 4. Total incontinence. Total incontinence is the loss of urine as a result of the inability to store urine within your bladder. DIAGNOSIS  Evaluating the cause of incontinence may require:  A thorough and complete medical and obstetric history.  A complete physical exam.  Laboratory tests such as a urine culture and sensitivities. When additional tests are  indicated, they can include:  An ultrasound exam.  Kidney and bladder X-rays.  Cystoscopy. This is an exam of the bladder using a narrow scope.  Urodynamic testing to test the nerve function to the bladder and sphincter areas. TREATMENT  Treatment for urinary incontinence depends on the cause:  For urge incontinence caused by a bacterial infection, antibiotics will be prescribed. If the urge incontinence is related to medicines you take, your health care provider may have you change the medicine.  For stress incontinence, surgery to re-establish anatomical support to the bladder or sphincter, or both, will often correct the condition.  For overflow incontinence caused by an enlarged prostate, an operation to open the channel through the enlarged prostate will allow the flow of urine out of the bladder. In women with fibroids, a hysterectomy may be recommended.  For total incontinence, surgery on your urinary sphincter may help. An artificial urinary sphincter (an inflatable cuff placed around the urethra) may be required. In women who have developed a hole-like passage between their bladder and vagina (vesicovaginal fistula), surgery to close the fistula often is required. HOME CARE INSTRUCTIONS  Normal daily hygiene and the use of pads or adult diapers that are changed regularly will help prevent odors and skin damage.  Avoid caffeine. It can overstimulate your bladder.  Use the bathroom regularly. Try about every 2 3 hours to go to the bathroom, even if you do not feel the need to do so. Take time to empty your bladder completely. After urinating, wait a minute. Then try to urinate again.  For causes involving nerve dysfunction, keep a  log of the medicines you take and a journal of the times you go to the bathroom. SEEK MEDICAL CARE IF:  You experience worsening of pain instead of improvement in pain after your procedure.  Your incontinence becomes worse instead of better. SEE  IMMEDIATE MEDICAL CARE IF:  You experience fever or shaking chills.  You are unable to pass your urine.  You have redness spreading into your groin or down into your thighs. MAKE SURE YOU:   Understand these instructions.   Will watch your condition.  Will get help right away if you are not doing well or get worse. Document Released: 02/27/2004 Document Revised: 11/09/2012 Document Reviewed: 06/28/2012 [facility] Patient Information 2014 [place].

## 2013-02-17 NOTE — Assessment & Plan Note (Addendum)
Referred for both mammogram and colonoscopy today.   Retinal scan in clinic today.

## 2013-02-17 NOTE — Assessment & Plan Note (Signed)
Patient states vaginal discharge resolved after fluconazole treatment.

## 2013-02-17 NOTE — Assessment & Plan Note (Addendum)
BP Readings from Last 3 Encounters:  02/17/13 114/68  02/08/13 161/77  09/02/12 131/75    Lab Results  Component Value Date   NA 145 02/08/2013   K 4.1 02/08/2013   CREATININE 0.90 02/08/2013    Assessment: Blood pressure control:  at goal  Progress toward BP goal:   stable  Plan: Medications:  continue current medications- amlodipine, carvedilol, lisinopril (patient no longer taking prn Lasix) Educational resources provided:  BP log
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
## 2013-02-17 NOTE — Assessment & Plan Note (Addendum)
Check TSH today.  No reported symptoms of hypothyroidism.   Will need to recheck TSH ~1 month after she starts taking levothyroxine from different manufacturer.

## 2013-02-17 NOTE — Assessment & Plan Note (Addendum)
Lipid panel today.  She is currently taking simvistatin 20 mg daily, will likely need to change to Lipitor at follow-up appt.   ADDENDUM: LDL 101, goal <70

## 2013-02-17 NOTE — Assessment & Plan Note (Signed)
Lab Results  Component Value Date   HGBA1C 6.8 02/17/2013   HGBA1C 6.5 09/02/2012   HGBA1C 6.6 03/24/2012     Assessment: Diabetes control:  good Progress toward A1C goal:   at goal   Plan: Medications:  continue current medications- metformin and glipizide; patient agreeable to start taking metformin twice daily Home glucose monitoring: none Instruction/counseling given: reminded to get eye exam- retinal scan today Educational resources provided: handout

## 2013-02-18 LAB — BASIC METABOLIC PANEL WITH GFR
BUN: 16 mg/dL (ref 6–23)
CO2: 25 mEq/L (ref 19–32)
Calcium: 9.7 mg/dL (ref 8.4–10.5)
Chloride: 105 mEq/L (ref 96–112)
Creat: 1.02 mg/dL (ref 0.50–1.10)
GFR, Est African American: 62 mL/min
GFR, Est Non African American: 54 mL/min — ABNORMAL LOW
Glucose, Bld: 127 mg/dL — ABNORMAL HIGH (ref 70–99)
Potassium: 4.6 mEq/L (ref 3.5–5.3)
Sodium: 139 mEq/L (ref 135–145)

## 2013-02-18 LAB — LIPID PANEL
Cholesterol: 176 mg/dL (ref 0–200)
HDL: 47 mg/dL (ref >39)
LDL Cholesterol: 101 mg/dL — ABNORMAL HIGH (ref 0–99)
Total CHOL/HDL Ratio: 3.7 Ratio
Triglycerides: 138 mg/dL (ref <150)
VLDL: 28 mg/dL (ref 0–40)

## 2013-02-18 LAB — TSH: TSH: 4.305 u[IU]/mL (ref 0.350–4.500)

## 2013-02-21 ENCOUNTER — Encounter: Payer: Self-pay | Admitting: Physician Assistant

## 2013-02-22 NOTE — Progress Notes (Signed)
Case discussed with Dr. Rogers at the time of the visit.  We reviewed the resident's history and exam and pertinent patient test results.  I agree with the assessment, diagnosis, and plan of care documented in the resident's note. 

## 2013-02-23 ENCOUNTER — Other Ambulatory Visit: Payer: Self-pay | Admitting: Internal Medicine

## 2013-02-23 NOTE — Telephone Encounter (Signed)
Okay per Dr Stann Mainland to change manufacturer to [facility] for pt's Levothyroxine 100 mcg tabs

## 2013-02-24 ENCOUNTER — Other Ambulatory Visit: Payer: Self-pay | Admitting: *Deleted

## 2013-02-24 MED ORDER — OMEPRAZOLE 20 MG PO CPDR: 20.0000 mg | DELAYED_RELEASE_CAPSULE | Freq: Every day | ORAL | Status: DC

## 2013-02-28 ENCOUNTER — Other Ambulatory Visit: Payer: Self-pay | Admitting: Internal Medicine

## 2013-03-01 NOTE — Telephone Encounter (Signed)
Rx for generic Ambien called in to pharmacy from 02/23/13 Dr Jerilynn Mages. Stann Mainland. Hilda Blades Karstyn Birkey RN 03/01/13 8:30AM

## 2013-03-01 NOTE — Telephone Encounter (Signed)
Plavix and Ambien have already been called in in past week.

## 2013-03-02 ENCOUNTER — Other Ambulatory Visit: Payer: Self-pay | Admitting: *Deleted

## 2013-03-02 ENCOUNTER — Ambulatory Visit (INDEPENDENT_AMBULATORY_CARE_PROVIDER_SITE_OTHER): Payer: Commercial Managed Care - HMO | Admitting: Physician Assistant

## 2013-03-02 ENCOUNTER — Telehealth: Payer: Self-pay | Admitting: *Deleted

## 2013-03-02 ENCOUNTER — Encounter: Payer: Self-pay | Admitting: Physician Assistant

## 2013-03-02 VITALS — BP 138/60 | HR 72 | Ht 67.5 in | Wt 217.0 lb

## 2013-03-02 DIAGNOSIS — Z7901 Long term (current) use of anticoagulants: Secondary | ICD-10-CM

## 2013-03-02 DIAGNOSIS — Z1211 Encounter for screening for malignant neoplasm of colon: Secondary | ICD-10-CM

## 2013-03-02 MED ORDER — MOVIPREP 100 G PO SOLR: 1.0000 | ORAL | Status: DC

## 2013-03-02 NOTE — Telephone Encounter (Signed)
03/02/2013   RE: Caitlyn Aguirre DOB: 31-Aug-1935 MRN: [id number]   Dear Dr. Ivin Poot,    We have scheduled the above patient for an endoscopic procedure. Our records show that she is on anticoagulation therapy.   Please advise as to how long the patient may come off her therapy of Plavix prior to the procedure, which is scheduled for 03-13-2013.  Please fax back/ or route the completed form to [facility] at [phone].   Sincerely,   Amy Esterwood PA-C    Marisue Humble CMA

## 2013-03-02 NOTE — Progress Notes (Signed)
Subjective:    Patient ID: Caitlyn Aguirre, female    DOB: 08-11-1935, 78 y.o.   MRN: [id number]  HPI  Caitlyn Aguirre  Is 78-year-old female known to [facility]. She had had colonoscopy in 2004 for screening and this showed left colon diverticulosis and internal hemorrhoids only. She is referred back today for consideration of followup colonoscopy. She has no current GI complaints. She says she does have mild problems with constipation and takes a stool softener which seems to work. She has no current complaints of abdominal pain changes in bowel habits melena or hematochezia.   other medical problems include COPD, recent episode of bronchitis, adult onset diabetes mellitus, congestive heart failure with EF of 55%, hyperlipidemia, coronary artery disease status post MI in 1999 and 2 stents thereafter.  She also had a TIA in 2009. She says she's been on Plavix since the TIA and has had no recurrent events. Her Plavix as prescribed by Dr. Ivin Poot.     Review of Systems  Constitutional: Negative.   HENT: Negative.   Eyes: Negative.   Respiratory: Negative.   Gastrointestinal: Positive for constipation.  Endocrine: Negative.   Genitourinary: Negative.   Musculoskeletal: Negative.   Allergic/Immunologic: Negative.   Neurological: Negative.   Hematological: Negative.   Psychiatric/Behavioral: Negative.    Outpatient Prescriptions Prior to Visit  Medication Sig Dispense Refill  . ACCU-CHEK FASTCLIX LANCETS MISC Inject 1 each into the skin once.      Marland Kitchen ACCU-CHEK FASTCLIX LANCETS MISC USE TO CHECK BLOOD SUGAR TWICE DAILY  102 each  2  . amLODipine (NORVASC) 10 MG tablet TAKE 1 TABLET BY MOUTH EVERY DAY  90 tablet  1  . carvedilol (COREG) 25 MG tablet TAKE 1 TABLET BY MOUTH TWICE DAILY WITH A MEAL  60 tablet  5  . cefdinir (OMNICEF) 300 MG capsule Take 1 capsule (300 mg total) by mouth 2 (two) times daily.  20 capsule  0  . clopidogrel (PLAVIX) 75 MG tablet Take 1 tablet (75 mg total) by  mouth daily.  90 tablet  1  . dextromethorphan (DELSYM) 30 MG/5ML liquid Take 2.5 mLs (15 mg total) by mouth 2 (two) times daily as needed for cough.  90 mL  0  . docusate sodium (COLACE) 100 MG capsule Take 100 mg by mouth 2 (two) times a week.       . fluticasone (FLOVENT HFA) 110 MCG/ACT inhaler Inhale 1 puff into the lungs 2 (two) times daily as needed. For shortness of breath      . furosemide (LASIX) 20 MG tablet TAKE ONE TABLET BY MOUTH EVERY DAY AS NEEDED FOR LEG SWELLING  30 tablet  5  . glipiZIDE (GLUCOTROL XL) 5 MG 24 hr tablet Take 1 tablet (5 mg total) by mouth daily.  90 tablet  1  . Hydrocodone-Acetaminophen 5-300 MG TABS Take 1 tablet by mouth 2 (two) times daily as needed. For pain.  30 each  0  . ibuprofen (ADVIL,MOTRIN) 200 MG tablet Take 200 mg by mouth every 6 (six) hours as needed. For pain      . ipratropium (ATROVENT) 0.06 % nasal spray Place 2 sprays into both nostrils 4 (four) times daily.  15 mL  12  . ketoconazole (NIZORAL) 2 % cream Apply topically daily.  15 g  0  . levothyroxine (SYNTHROID, LEVOTHROID) 100 MCG tablet TAKE ONE TABLET BY MOUTH EVERY DAY  90 tablet  1  . lisinopril (PRINIVIL,ZESTRIL) 5 MG tablet TAKE 1 TABLET  BY MOUTH DAILY  30 tablet  5  . metFORMIN (GLUCOPHAGE) 1000 MG tablet TAKE ONE TABLET BY MOUTH TWICE DAILY WITH A MEAL  180 tablet  1  . omeprazole (PRILOSEC) 20 MG capsule Take 1 capsule (20 mg total) by mouth daily.  90 capsule  1  . PARoxetine (PAXIL) 20 MG tablet TAKE 1 TABLET BY MOUTH EVERY MORNING  90 tablet  1  . potassium chloride SA (K-DUR,KLOR-CON) 20 MEQ tablet TAKE 1 TABLET BY MOUTH EVERY DAY  30 tablet  2  . PROAIR HFA 108 (90 BASE) MCG/ACT inhaler INHALE 2 PUFFS BY MOUTH EVERY 6 HOURS AS NEEDED FOR WHEEZING  1 Inhaler  4  . simvastatin (ZOCOR) 20 MG tablet TAKE 1 TABLET BY MOUTH EVERY MORNING  30 tablet  5  . SPIRIVA HANDIHALER 18 MCG inhalation capsule INHALE THE CONTENTS OF 1 CAPSULE VIA HANDIHALER BY MOUTH EVERY DAY  30 capsule  2    . zolpidem (AMBIEN) 5 MG tablet Take 1 tablet (5 mg total) by mouth at bedtime as needed for sleep.  30 tablet  5  . fluconazole (DIFLUCAN) 100 MG tablet Please take on tablet once daily for 10 days then take one tablet once weekly for 6 months.  35 tablet  0   No facility-administered medications prior to visit.   No Known Allergies Patient Active Problem List   Diagnosis Date Noted  . Preventive measure 02/17/2013  . Ear canal dryness 09/02/2012  . Dyshidrotic eczema 09/02/2012  . Dysuria 05/30/2012  . Vaginitis and vulvovaginitis 05/30/2012  . Fatigue 03/24/2012  . Obstructive sleep apnea 12/25/2011  . Candidiasis of skin 10/08/2011  . Stress incontinence in female 06/17/2011  . Chronic back pain 03/17/2011  . Headache 03/17/2011  . Boil, back 12/24/2010  . Anemia 11/26/2010  . SHOULDER PAIN, RIGHT 10/09/2009  . HYPOTHYROIDISM NOS 01/21/2006  . DYSLIPIDEMIA 01/21/2006  . DEPRESSION 01/21/2006  . HYPERTENSION 01/21/2006  . Chronic diastolic heart failure 01/21/2006  . CHRONIC OBSTRUCTIVE PULMONARY DISEASE, ACUTE EXACERBATION 01/21/2006  . GASTROESOPHAGEAL REFLUX DISEASE 01/21/2006  . OSTEOARTHRITIS 01/21/2006  . DIABETES MELLITUS 10/04/1998   History  Substance Use Topics  . Smoking status: Former Smoker -- 1.00 packs/day for 20 years    Types: Cigarettes    Quit date: 02/02/1974  . Smokeless tobacco: Former Neurosurgeon    Types: Snuff, Chew     Comment: "tried to use chew & snuff; didn't do it regular"  . Alcohol Use: No     Comment: "stopped all alcohol in 1976"   family history is negative for Anesthesia problems and Malignant hyperthermia.     Objective:   Physical Exam well-developed elderly African American female in no acute distress, pleasant blood pressure 136/60 pulse 72 height 5 foot 7 weight 217. HEENT; nontraumatic normocephalic EOMI PERRLA sclera anicteric, Supple; no JVD, Cardiovascular; regular rate and rhythm with S1-S2, Pulmonary; clear bilaterally,  Abdomen; large soft nontender nondistended bowel sounds are active no palpable mass or hepatosplenomegaly, Rectal ;exam not done, Extremities no clubbing cyanosis or edema skin warm and dry, Psych; mood and affect appropriate        Assessment& Plan:  #43  78 year old female for colon neoplasia screening last colonoscopy 11 years ago #2 diverticulosis #3 history of internal hemorrhoids #4 chronic antiplatelet therapy with Plavix #5 history of TIA 2009 #6 coronary artery disease status post MI and stents 1999 #7 diabetes mellitus #8 obesity #9 COPD #10 congestive heart failure with EF of 55%  Plan; patient  will be scheduled for colonoscopy with Dr. Sharlett Iles. Procedure discussed in detail with the patient and she is agreeable to proceed. We will obtain consent from her primary care physician Dr. Ivin Poot for her to hold Plavix for 5 days prior to the procedure.

## 2013-03-02 NOTE — Patient Instructions (Signed)
You have been scheduled for a colonoscopy with propofol. Please follow written instructions given to you at your visit today.  Please pick up your prep kit at the pharmacy within the next 1-3 days.  [address] [address]. If you use inhalers (even only as needed), please bring them with you on the day of your procedure.

## 2013-03-02 NOTE — Telephone Encounter (Signed)
Ok to hold Plavix prior to colonoscopy for however long anticoagulation typically held prior to this procedure.    Ivin Poot, MD

## 2013-03-03 ENCOUNTER — Other Ambulatory Visit: Payer: Self-pay | Admitting: *Deleted

## 2013-03-03 ENCOUNTER — Encounter: Payer: Self-pay | Admitting: *Deleted

## 2013-03-03 ENCOUNTER — Telehealth: Payer: Self-pay | Admitting: *Deleted

## 2013-03-03 NOTE — Telephone Encounter (Signed)
Called and the line was busy. Will try later.

## 2013-03-08 NOTE — Telephone Encounter (Signed)
Advised the patient to hold the Plavix starting today 2-4 and resume it on 03-14-2013.  Patient verbalized understanding the instructions.

## 2013-03-13 ENCOUNTER — Ambulatory Visit (AMBULATORY_SURGERY_CENTER): Payer: Medicare HMO | Admitting: Gastroenterology

## 2013-03-13 ENCOUNTER — Encounter: Payer: Self-pay | Admitting: Gastroenterology

## 2013-03-13 VITALS — BP 160/73 | HR 71 | Temp 98.5°F | Resp 19 | Ht 67.0 in | Wt 217.0 lb

## 2013-03-13 DIAGNOSIS — K5730 Diverticulosis of large intestine without perforation or abscess without bleeding: Secondary | ICD-10-CM

## 2013-03-13 DIAGNOSIS — Z1211 Encounter for screening for malignant neoplasm of colon: Secondary | ICD-10-CM

## 2013-03-13 LAB — GLUCOSE, CAPILLARY
Glucose-Capillary: 164 mg/dL — ABNORMAL HIGH (ref 70–99)
Glucose-Capillary: 137 mg/dL — ABNORMAL HIGH (ref 70–99)

## 2013-03-13 MED ORDER — SODIUM CHLORIDE 0.9 % IV SOLN: 500.0000 mL | INTRAVENOUS | Status: DC

## 2013-03-13 NOTE — Patient Instructions (Addendum)
YOU HAD AN ENDOSCOPIC PROCEDURE TODAY AT [facility]: Refer to the procedure report that was given to you for any specific questions about what was found during the examination.  If the procedure report does not answer your questions, please call your gastroenterologist to clarify.  If you requested that your care partner not be given the details of your procedure findings, then the procedure report has been included in a sealed envelope for you to review at your convenience later.  YOU SHOULD EXPECT: Some feelings of bloating in the abdomen. Passage of more gas than usual.  Walking can help get rid of the air that was put into your GI tract during the procedure and reduce the bloating. If you had a lower endoscopy (such as a colonoscopy or flexible sigmoidoscopy) you may notice spotting of blood in your stool or on the toilet paper. If you underwent a bowel prep for your procedure, then you may not have a normal bowel movement for a few days.  DIET: Your first meal following the procedure should be a light meal and then it is ok to progress to your normal diet.  A half-sandwich or bowl of soup is an example of a good first meal.  Heavy or fried foods are harder to digest and may make you feel nauseous or bloated.  Likewise meals heavy in dairy and vegetables can cause extra gas to form and this can also increase the bloating.  Drink plenty of fluids but you should avoid alcoholic beverages for 24 hours.  Increase the fiber in your diet.  ACTIVITY: Your care partner should take you home directly after the procedure.  You should plan to take it easy, moving slowly for the rest of the day.  You can resume normal activity the day after the procedure however you should NOT DRIVE or use heavy machinery for 24 hours (because of the sedation medicines used during the test).    SYMPTOMS TO REPORT IMMEDIATELY: A gastroenterologist can be reached at any hour.  During normal business hours, 8:30 AM to  5:00 PM Monday through Friday, call [phone].  After hours and on weekends, please call the GI answering service at [phone] who will take a message and have the physician on call contact you.   Following lower endoscopy (colonoscopy or flexible sigmoidoscopy):  Excessive amounts of blood in the stool  Significant tenderness or worsening of abdominal pains  Swelling of the abdomen that is new, acute  Fever of 100F or higher  FOLLOW UP: If any biopsies were taken you will be contacted by phone or by letter within the next 1-3 weeks.  Call your gastroenterologist if you have not heard about the biopsies in 3 weeks.  Our staff will call the home number listed on your records the next business day following your procedure to check on you and address any questions or concerns that you may have at that time regarding the information given to you following your procedure. This is a courtesy call and so if there is no answer at the home number and we have not heard from you through the emergency physician on call, we will assume that you have returned to your regular daily activities without incident.  SIGNATURES/CONFIDENTIALITY: You and/or your care partner have signed paperwork which will be entered into your electronic medical record.  These signatures attest to the fact that that the information above on your After Visit Summary has been reviewed and is understood.  Full responsibility of the confidentiality of this discharge information lies with you and/or your care-partner.   Start your plavix today per Dr. Sharlett Iles.

## 2013-03-13 NOTE — Op Note (Signed)
[place]  [address] [place], [id number]   COLONOSCOPY PROCEDURE REPORT  PATIENT: Caitlyn Aguirre, Caitlyn Aguirre  MR#: [id number] BIRTHDATE: 06-Oct-1935 , 77  yrs. old GENDER: Female ENDOSCOPIST: Sable Feil, MD, [facility] REFERRED HF:WYOVZCH Genelle Gather, M.D. PROCEDURE DATE:  03/13/2013 PROCEDURE:   Colonoscopy, screening First Screening Colonoscopy - Avg.  risk and is 50 yrs.  old or older - No.      History of Adenoma - Now for follow-up colonoscopy & has been > or = to 3 yrs.  N/A ASA CLASS:   Class III INDICATIONS:average risk screening. MEDICATIONS: propofol (Diprivan) 250mg  IV  DESCRIPTION OF PROCEDURE:   After the risks benefits and alternatives of the procedure were thoroughly explained, informed consent was obtained.  A digital rectal exam revealed no abnormalities of the rectum.   The LB [id number]  endoscope was introduced through the anus and advanced to the cecum, which was identified by both the appendix and ileocecal valve. No adverse events experienced.   The quality of the prep was poor, using MoviPrep  The instrument was then slowly withdrawn as the colon was fully examined.      COLON FINDINGS: Moderate diverticulosis was noted in the descending colon and sigmoid colon.   The colon was otherwise normal.  There was no diverticulosis, inflammation, polyps or cancers unless previously stated.   Internal hemorrhoids were found.  Retroflexed views revealed internal hemorrhoids. The time to cecum=7 minutes 20 seconds.  Withdrawal time=8 minutes 58 seconds.  The scope was withdrawn and the procedure completed. COMPLICATIONS: There were no complications.  ENDOSCOPIC IMPRESSION: 1.   Moderate diverticulosis was noted in the descending colon and sigmoid colon 2.   The colon was otherwise normal ...very poor prep however 3.   Internal hemorrhoids  RECOMMENDATIONS: 1.  Continue current medications 2.  High fiber diet 3.  Given your age, you will  not need another colonoscopy for colon cancer screening or polyp surveillance.  These types of tests usually stop around the age 37.   eSigned:  Sable Feil, MD, [facility] 03/13/2013 9:31 AM   cc:   PATIENT NAME:  [name], [name] MR#: [id number]

## 2013-03-13 NOTE — Progress Notes (Signed)
A/ox3 pleased with MAC, report to Suzanne RN 

## 2013-03-14 ENCOUNTER — Telehealth: Payer: Self-pay | Admitting: *Deleted

## 2013-03-14 NOTE — Telephone Encounter (Signed)
No answer, message left for the patient. 

## 2013-03-17 ENCOUNTER — Other Ambulatory Visit: Payer: Self-pay | Admitting: Internal Medicine

## 2013-03-22 ENCOUNTER — Other Ambulatory Visit: Payer: Self-pay | Admitting: Internal Medicine

## 2013-03-22 NOTE — Telephone Encounter (Signed)
Just sent in rx for 6 month supply of Plavix on 1/23 2014 thus additional refill not necessary at this time.  Furthermore, I will likely stop Plavix at our next visit as there is no clear indication for patient to be taking it.  Thanks

## 2013-03-28 ENCOUNTER — Other Ambulatory Visit: Payer: Self-pay | Admitting: *Deleted

## 2013-03-29 MED ORDER — RANITIDINE HCL 150 MG PO TABS: 150.0000 mg | ORAL_TABLET | Freq: Two times a day (BID) | ORAL | Status: DC

## 2013-04-21 ENCOUNTER — Other Ambulatory Visit: Payer: Self-pay | Admitting: *Deleted

## 2013-04-21 ENCOUNTER — Other Ambulatory Visit: Payer: Self-pay | Admitting: Internal Medicine

## 2013-04-21 MED ORDER — ASPIRIN EC 81 MG PO TBEC: 81.0000 mg | DELAYED_RELEASE_TABLET | Freq: Every day | ORAL | Status: AC

## 2013-04-21 NOTE — Progress Notes (Signed)
On chart review, patient was on ASA but changed to Plavix in 08/2008 when she was admitted for syncope.  Neuro workup was negative at that time. Cardiac workup included negative cardiac catheterization in 09/2008, TTE negative other than septal aneurysm so plan was to rule out PFO with bubble study.  Per documentation, "Plavix was initiated in the interim pending that study."  Appears that bubble study was never done, and patient was left on the Plavix indefinitely.  After discussion with several MDs, decided that patient can discontinue Plavix, restart ASA now; does not bubble study at this time given low pre-test probability and timeline.  Have adjusted her medication list accordingly.

## 2013-04-21 NOTE — Telephone Encounter (Signed)
After extensive chart review and discussion with several MDs, patient does not need to be on Plavix.  She should take ASA 81 mg daily given ?TIA in 2009.  I will add this medication to her list. Thanks

## 2013-04-24 NOTE — Telephone Encounter (Signed)
Pt informed to stop plavix and start ASA 81 mg daily. She questioned why she had not been informed about this. Would you call pt and explain to her?

## 2013-04-25 ENCOUNTER — Other Ambulatory Visit: Payer: Self-pay | Admitting: Internal Medicine

## 2013-04-25 MED ORDER — ONDANSETRON HCL 4 MG PO TABS: 4.0000 mg | ORAL_TABLET | Freq: Every day | ORAL | Status: DC | PRN

## 2013-04-25 NOTE — Telephone Encounter (Signed)
Doing this now. °

## 2013-04-25 NOTE — Telephone Encounter (Signed)
Spoke to [name] regarding discontinuation of Plavix, explained reasons for doing this (see orders only encounter on 3/20), and answered her questions.  She is amenable with this plan and started daily ASA therapy yesterday.   Caitlyn Aguirre says she is doing well otherwise, is aware of our follow-up visit in May.  At end of our call, she requested refill on Zofran which she "likes to have on hand" in case she feels nauseous after eating.  She has not been feeling nauseous lately though and is taking omeprazole daily.  I agreed to send in rx for Zofran 4 mg prn #10.

## 2013-05-18 ENCOUNTER — Other Ambulatory Visit: Payer: Self-pay | Admitting: Internal Medicine

## 2013-05-23 ENCOUNTER — Other Ambulatory Visit: Payer: Self-pay | Admitting: Internal Medicine

## 2013-05-24 NOTE — Telephone Encounter (Signed)
Patient is no longer on Plavix.  This has been documented in chart multiple times and also explained to patient over phone (see phone note).  Patient was also just given rx for Zofran #10 less than a week ago; this should only be a prn medication, and If she is having persistent nausea/vomiting, she will need to be evaluated in clinic. Thanks

## 2013-05-25 ENCOUNTER — Telehealth: Payer: Self-pay | Admitting: *Deleted

## 2013-05-25 NOTE — Telephone Encounter (Signed)
PA request from [place]  For pt's zolpidem  5mg  tabs, request submitted online.  Dx 780.52 (insomnia) and 344 (depression) was given as dx codes. Request sent for "review", decision to be made in 24-72 hours.Aileen Pilot Goldston4/23/201511:12 AM  REF# [id number]    [employer] [phone] ID# [id number]

## 2013-05-25 NOTE — Telephone Encounter (Signed)
Pt informed.  She is taking a baby asa, no plavix.

## 2013-05-29 NOTE — Telephone Encounter (Signed)
Received faxed confirmation that medication was approved until 05/26/2014.Caitlyn Renault C Goldston4/27/201510:56 AM

## 2013-06-09 ENCOUNTER — Encounter: Payer: Medicare HMO | Admitting: Internal Medicine

## 2013-06-14 ENCOUNTER — Other Ambulatory Visit: Payer: Self-pay | Admitting: Internal Medicine

## 2013-06-16 ENCOUNTER — Other Ambulatory Visit: Payer: Self-pay | Admitting: *Deleted

## 2013-06-16 DIAGNOSIS — E119 Type 2 diabetes mellitus without complications: Secondary | ICD-10-CM

## 2013-06-16 MED ORDER — ACCU-CHEK FASTCLIX LANCETS MISC: Status: DC

## 2013-06-16 NOTE — Telephone Encounter (Signed)
Patient no longer on Plavix as has been documented multiple times now and communicated to patient twice via phone calls (yet she continues to request refills on it), Zofran is prn only (so if patient is still having nausea, she needs to be re-evaluated), Spiriva was refilled on 05/24/13 #30 R2, and Ambien was refilled in 03/2013 #30 R5 so both of these refill requests are too soon. Thanks

## 2013-06-20 ENCOUNTER — Other Ambulatory Visit: Payer: Self-pay | Admitting: Internal Medicine

## 2013-06-21 ENCOUNTER — Ambulatory Visit (INDEPENDENT_AMBULATORY_CARE_PROVIDER_SITE_OTHER): Payer: Medicare HMO | Admitting: Internal Medicine

## 2013-06-21 ENCOUNTER — Encounter: Payer: Self-pay | Admitting: Internal Medicine

## 2013-06-21 VITALS — BP 144/77 | HR 79 | Temp 97.9°F | Ht 68.0 in | Wt 219.2 lb

## 2013-06-21 DIAGNOSIS — E119 Type 2 diabetes mellitus without complications: Secondary | ICD-10-CM

## 2013-06-21 DIAGNOSIS — E785 Hyperlipidemia, unspecified: Secondary | ICD-10-CM

## 2013-06-21 DIAGNOSIS — L309 Dermatitis, unspecified: Secondary | ICD-10-CM | POA: Insufficient documentation

## 2013-06-21 DIAGNOSIS — IMO0002 Reserved for concepts with insufficient information to code with codable children: Secondary | ICD-10-CM

## 2013-06-21 DIAGNOSIS — M1710 Unilateral primary osteoarthritis, unspecified knee: Secondary | ICD-10-CM

## 2013-06-21 DIAGNOSIS — E039 Hypothyroidism, unspecified: Secondary | ICD-10-CM

## 2013-06-21 DIAGNOSIS — M1990 Unspecified osteoarthritis, unspecified site: Secondary | ICD-10-CM

## 2013-06-21 DIAGNOSIS — I10 Essential (primary) hypertension: Secondary | ICD-10-CM

## 2013-06-21 DIAGNOSIS — K219 Gastro-esophageal reflux disease without esophagitis: Secondary | ICD-10-CM

## 2013-06-21 LAB — COMPLETE METABOLIC PANEL WITH GFR
ALT: 9 U/L (ref 0–35)
AST: 15 U/L (ref 0–37)
Albumin: 4.2 g/dL (ref 3.5–5.2)
Alkaline Phosphatase: 106 U/L (ref 39–117)
BUN: 15 mg/dL (ref 6–23)
CO2: 27 mEq/L (ref 19–32)
Calcium: 10.1 mg/dL (ref 8.4–10.5)
Chloride: 108 mEq/L (ref 96–112)
Creat: 0.86 mg/dL (ref 0.50–1.10)
GFR, Est African American: 76 mL/min
GFR, Est Non African American: 66 mL/min
Glucose, Bld: 92 mg/dL (ref 70–99)
Potassium: 4.4 mEq/L (ref 3.5–5.3)
Sodium: 142 mEq/L (ref 135–145)
Total Bilirubin: 0.6 mg/dL (ref 0.2–1.2)
Total Protein: 7.4 g/dL (ref 6.0–8.3)

## 2013-06-21 LAB — CBC
HCT: 38.5 % (ref 36.0–46.0)
Hemoglobin: 12.9 g/dL (ref 12.0–15.0)
MCH: 28.7 pg (ref 26.0–34.0)
MCHC: 33.5 g/dL (ref 30.0–36.0)
MCV: 85.7 fL (ref 78.0–100.0)
Platelets: 222 10*3/uL (ref 150–400)
RBC: 4.49 MIL/uL (ref 3.87–5.11)
RDW: 15.5 % (ref 11.5–15.5)
WBC: 7.5 10*3/uL (ref 4.0–10.5)

## 2013-06-21 LAB — TSH: TSH: 2.709 u[IU]/mL (ref 0.350–4.500)

## 2013-06-21 LAB — POCT GLYCOSYLATED HEMOGLOBIN (HGB A1C): Hemoglobin A1C: 6.8

## 2013-06-21 LAB — GLUCOSE, CAPILLARY: Glucose-Capillary: 95 mg/dL (ref 70–99)

## 2013-06-21 MED ORDER — TRIAMCINOLONE ACETONIDE 0.5 % EX OINT: 1.0000 "application " | TOPICAL_OINTMENT | Freq: Two times a day (BID) | CUTANEOUS | Status: DC

## 2013-06-21 MED ORDER — ATORVASTATIN CALCIUM 10 MG PO TABS: 10.0000 mg | ORAL_TABLET | Freq: Every day | ORAL | Status: DC

## 2013-06-21 MED ORDER — HYDROCODONE-ACETAMINOPHEN 5-325 MG PO TABS: 1.0000 | ORAL_TABLET | Freq: Four times a day (QID) | ORAL | Status: DC | PRN

## 2013-06-21 MED ORDER — ONDANSETRON HCL 4 MG PO TABS: 4.0000 mg | ORAL_TABLET | Freq: Every day | ORAL | Status: DC | PRN

## 2013-06-21 NOTE — Telephone Encounter (Signed)
Flag sent to front desk pool for an appt.

## 2013-06-21 NOTE — Assessment & Plan Note (Addendum)
Switched patient from simvastatin 20 mg daily to atorvastatin 10 mg daily today given that she is also taking amlodipine.  Consider uptitrating to atovastatin 20 mg daily at next visit if no problems.  CMP today.
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
## 2013-06-21 NOTE — Telephone Encounter (Signed)
Waiting for BMP results before refilling potassium supplementation.

## 2013-06-21 NOTE — Assessment & Plan Note (Signed)
Lab Results  Component Value Date   HGBA1C 6.8 06/21/2013   HGBA1C 6.8 02/17/2013   HGBA1C 6.5 09/02/2012     Assessment: Diabetes control:  good  Progress toward A1C goal:   stable, at goal  Plan: Medications:  continue current medications- metformin and glipized Home glucose monitoring: none Instruction/counseling given: reminded to bring medications to each visit

## 2013-06-21 NOTE — Assessment & Plan Note (Addendum)
GERD controlled on omeprazole.  However, patient complains of intermittent nausea following big meals.  States that prn Zofran is very effective, she does not need to take it more than 3x/week thus we agreed on 15 tablets per month.  Also discussed eating smaller meals more frequently.

## 2013-06-21 NOTE — Assessment & Plan Note (Signed)
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 Triamcinolone 0.5% ointment BID for 1-2 weeks.  Consider dermatology referral if issue persists.

## 2013-06-21 NOTE — Assessment & Plan Note (Addendum)
BP Readings from Last 3 Encounters:  06/21/13 144/77  03/13/13 160/73  03/02/13 138/60    Lab Results  Component Value Date   NA 139 02/17/2013   K 4.6 02/17/2013   CREATININE 1.02 02/17/2013    Assessment: Blood pressure control:  at goal  Progress toward BP goal:   stable  Plan: Medications:  continue current medications- amlodipine, carvedilol, lisinopril BMP today to further evaluate need for potassium supplementation.

## 2013-06-21 NOTE — Patient Instructions (Addendum)
Follow-up in 3-4 months.   We changed your cholesterol medicine to Lipitor today and called that into your pharmacy.  We also refilled your pain medicine and called in a cream for you to use twice daily for 1-2 weeks on the areas on your fingers.    Take Tylenol 500 mg 1-2 tablets 2-3 times per day as needed for your headaches.  You can buy Tylenol over the counter when you go to pick up your other prescriptions.    We are checking several blood tests today including checking your thyroid and potassium.  I will let you know if the results are abnormal and will call in potassium supplementation if necessary.

## 2013-06-21 NOTE — Assessment & Plan Note (Signed)
Patient complaining of arthritic knee pain today.  She has pain contract but has not requested Vicodin in months, will refill today.

## 2013-06-21 NOTE — Assessment & Plan Note (Addendum)
TSH today given complaint of fatigue (though likely related to situation with sick granddaughter) and now that she has been taking Synthroid from new manufacturer for several months.  Will check CBC today as well.

## 2013-06-22 NOTE — Progress Notes (Signed)
Case discussed with Dr. Rogers at the time of the visit.  We reviewed the resident's history and exam and pertinent patient test results.  I agree with the assessment, diagnosis, and plan of care documented in the resident's note. 

## 2013-06-23 ENCOUNTER — Telehealth: Payer: Self-pay | Admitting: Internal Medicine

## 2013-06-23 NOTE — Telephone Encounter (Signed)
Called patient's residence and spoke with her daughter regarding lab results and continuing potassium supplementation.  Daughter voiced understanding, will let patient know.

## 2013-07-12 ENCOUNTER — Other Ambulatory Visit: Payer: Self-pay | Admitting: Internal Medicine

## 2013-07-12 NOTE — Telephone Encounter (Signed)
Patient not due for refills on rest of her medications until end of July.  Thanks

## 2013-07-18 ENCOUNTER — Other Ambulatory Visit: Payer: Self-pay | Admitting: Internal Medicine

## 2013-07-19 NOTE — Telephone Encounter (Signed)
This prescription was just called in on 07/12/13. Thanks

## 2013-08-10 ENCOUNTER — Other Ambulatory Visit: Payer: Self-pay | Admitting: Internal Medicine

## 2013-08-10 NOTE — Telephone Encounter (Signed)
Patient was switched from simvastatin to atorvastatin at last visit.  She should not be taking simvastatin any longer.  Will not refill this medication.   Called number on file, patient not home.  Asked for her to call back.

## 2013-08-10 NOTE — Telephone Encounter (Signed)
Pt called no answer message left

## 2013-08-14 NOTE — Telephone Encounter (Signed)
Talked to pt's daughter - stated pt was not at home,informed her pt should be taking Lipitor not Simvastatin; she stated okay.

## 2013-08-16 ENCOUNTER — Other Ambulatory Visit: Payer: Self-pay | Admitting: Internal Medicine

## 2013-08-16 NOTE — Telephone Encounter (Signed)
Called to pharm, message to [place]. For appt

## 2013-08-16 NOTE — Telephone Encounter (Signed)
Pls sch Sep / Oct appt with PCP  Pls phone in the Ambien.  Thanks

## 2013-08-18 ENCOUNTER — Encounter: Payer: Self-pay | Admitting: Internal Medicine

## 2013-09-14 ENCOUNTER — Other Ambulatory Visit: Payer: Self-pay | Admitting: *Deleted

## 2013-09-14 DIAGNOSIS — E785 Hyperlipidemia, unspecified: Secondary | ICD-10-CM

## 2013-09-14 MED ORDER — ATORVASTATIN CALCIUM 10 MG PO TABS: 10.0000 mg | ORAL_TABLET | Freq: Every day | ORAL | Status: DC

## 2013-09-14 NOTE — Telephone Encounter (Signed)
Was changed to Atorvastatin 10 at last OV in May bc of amlodipine. Will refill Atorva.

## 2013-09-15 ENCOUNTER — Other Ambulatory Visit: Payer: Self-pay | Admitting: *Deleted

## 2013-09-18 MED ORDER — POTASSIUM CHLORIDE CRYS ER 20 MEQ PO TBCR: EXTENDED_RELEASE_TABLET | ORAL | Status: DC

## 2013-09-19 ENCOUNTER — Encounter (HOSPITAL_COMMUNITY): Payer: Self-pay | Admitting: Emergency Medicine

## 2013-09-19 ENCOUNTER — Emergency Department (INDEPENDENT_AMBULATORY_CARE_PROVIDER_SITE_OTHER)
Admission: EM | Admit: 2013-09-19 | Discharge: 2013-09-19 | Disposition: A | Discharge status: Home / Self Care | Payer: Medicare HMO | Source: Home / Self Care | Attending: Emergency Medicine | Admitting: Emergency Medicine

## 2013-09-19 DIAGNOSIS — N760 Acute vaginitis: Secondary | ICD-10-CM

## 2013-09-19 MED ORDER — FLUCONAZOLE 150 MG PO TABS: 150.0000 mg | ORAL_TABLET | Freq: Once | ORAL | Status: DC

## 2013-09-19 NOTE — ED Notes (Addendum)
Diabetic patient. Concerned about itching in perineal area, history of prior yeast infections, and feels as if she has  Another. Also concerned about discomfort in left ear

## 2013-09-19 NOTE — Discharge Instructions (Signed)
Your ear looks normal. Try putting a drop of oil in it at bedtime. It can be olive oil, mineral oil, vegetable oil, baby oil.  It does look like you have some yeast. Take Diflucan one pill once. Keep the area as dry as possible. You can try over-the-counter Monistat or Clotrimazole creams in the future.  Followup with your regular doctor in one to 2 weeks if your symptoms have not resolved.
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
## 2013-10-03 ENCOUNTER — Ambulatory Visit: Payer: Medicare HMO | Admitting: Internal Medicine

## 2013-10-03 ENCOUNTER — Encounter: Payer: Self-pay | Admitting: Internal Medicine

## 2013-10-12 ENCOUNTER — Ambulatory Visit (INDEPENDENT_AMBULATORY_CARE_PROVIDER_SITE_OTHER): Payer: Commercial Managed Care - HMO | Admitting: Internal Medicine

## 2013-10-12 ENCOUNTER — Encounter: Payer: Self-pay | Admitting: Internal Medicine

## 2013-10-12 VITALS — BP 116/60 | HR 61 | Temp 98.2°F | Ht 68.0 in | Wt 214.1 lb

## 2013-10-12 DIAGNOSIS — I10 Essential (primary) hypertension: Secondary | ICD-10-CM

## 2013-10-12 DIAGNOSIS — Z299 Encounter for prophylactic measures, unspecified: Secondary | ICD-10-CM

## 2013-10-12 DIAGNOSIS — E119 Type 2 diabetes mellitus without complications: Secondary | ICD-10-CM

## 2013-10-12 DIAGNOSIS — M1990 Unspecified osteoarthritis, unspecified site: Secondary | ICD-10-CM

## 2013-10-12 DIAGNOSIS — L259 Unspecified contact dermatitis, unspecified cause: Secondary | ICD-10-CM

## 2013-10-12 DIAGNOSIS — E785 Hyperlipidemia, unspecified: Secondary | ICD-10-CM

## 2013-10-12 DIAGNOSIS — L309 Dermatitis, unspecified: Secondary | ICD-10-CM

## 2013-10-12 DIAGNOSIS — E039 Hypothyroidism, unspecified: Secondary | ICD-10-CM

## 2013-10-12 LAB — POCT GLYCOSYLATED HEMOGLOBIN (HGB A1C): Hemoglobin A1C: 6.6

## 2013-10-12 LAB — GLUCOSE, CAPILLARY: Glucose-Capillary: 78 mg/dL (ref 70–99)

## 2013-10-12 MED ORDER — GLUCOSE BLOOD VI STRP: ORAL_STRIP | Status: DC

## 2013-10-12 MED ORDER — TRIAMCINOLONE ACETONIDE 0.5 % EX OINT: 1.0000 "application " | TOPICAL_OINTMENT | Freq: Two times a day (BID) | CUTANEOUS | Status: DC

## 2013-10-12 MED ORDER — ACCU-CHEK FASTCLIX LANCETS MISC: Status: DC

## 2013-10-12 MED ORDER — AMLODIPINE BESYLATE 5 MG PO TABS: ORAL_TABLET | ORAL | Status: DC

## 2013-10-12 MED ORDER — HYDROCODONE-ACETAMINOPHEN 5-325 MG PO TABS: 1.0000 | ORAL_TABLET | Freq: Four times a day (QID) | ORAL | Status: DC | PRN

## 2013-10-12 MED ORDER — ATORVASTATIN CALCIUM 20 MG PO TABS: 20.0000 mg | ORAL_TABLET | Freq: Every day | ORAL | Status: DC

## 2013-10-12 NOTE — Assessment & Plan Note (Signed)
Assessment: Patient continues to complain of bilateral hand rash located on fingers. Previously though to be dishidrotic ezcema. Patient was previously prescribed triamcinolone cream, but cannot remember if she used it or not.  Plan: I will re-prescribe cream. If patient returns and the rash is not better, we can consider dermatology referral.

## 2013-10-12 NOTE — Assessment & Plan Note (Signed)
Lab Results  Component Value Date   HGBA1C 6.6 10/12/2013   HGBA1C 6.8 06/21/2013   HGBA1C 6.8 02/17/2013     Assessment: Diabetes control:  Controlled Progress toward A1C goal:   At goal Comments: Patient denies hypoglycemic events.  Plan: Medications:  continue current medications Home glucose monitoring: Frequency:   Timing:   Instruction/counseling given: reminded to bring blood glucose meter & log to each visit, reminded to bring medications to each visit and discussed diet Educational resources provided:   Self management tools provided:  Glucometer Other plans: Patient will continue current medications. We have provided the patient with a glucometer and ordered her strips. Patient was taught how to use the machine and instructed to check her blood glucose if she ever has symptoms of hypoglycemia.

## 2013-10-12 NOTE — Progress Notes (Signed)
Subjective:     Patient ID: Caitlyn Aguirre, female   DOB: [birth year], 78 y.o.   MRN: [id number]  HPI Ms. Rippetoe is a 78 yo female with PMHx of Type II DM, COPD, hypothyroidism, HLD, GERD, OA and diastolic CHF who presents to the clinic for routine follow up. Patient states she has been doing well. She was recently seen in the ED on 8/18 for itching and burning from her vagina. She was diagnosed with candidal vulvovaginits and prescribed diflucan 150 mg once. Patient states that the itching and burning resolved, but she will continue to have it time to time. She does not have any today. Patient does have chronic urinary incontinence which predisposes her to yeast infections. I counseled the patient to keep herself clean and dry as much as possible. If she were to have a recurrent yeast infection, she could try Monistat or call the clinic.  Review of Systems General: Denies fever, chills, fatigue, change in appetite and diaphoresis.  Respiratory: Denies SOB, cough, DOE, chest tightness, and wheezing.   Cardiovascular: Denies chest pain and palpitations.  Gastrointestinal: Denies nausea, vomiting, abdominal pain, diarrhea, constipation, blood in stool and abdominal distention.  Genitourinary: Admits to incontinence. Denies dysuria, urgency, frequency, suprapubic pain and flank pain. Endocrine: Denies hot or cold intolerance, polyuria, and polydipsia. Musculoskeletal: Admits to joint pain and back pain. Denies myalgias, joint swelling, and gait problem.  Skin: Admits to rash on fingers. Denies pallor and wounds.  Neurological: Admits to occasional dizziness and headaches. Denies weakness, lightheadedness, numbness,seizures, and syncope, Psychiatric/Behavioral: Denies mood changes, confusion, nervousness, sleep disturbance and agitation.    Objective:   Physical Exam Filed Vitals:   10/12/13 1421  BP: 104/53  Pulse: 64  Temp: 98.2 F (36.8 C)  TempSrc: Oral  Height: 5\' 8"  (1.727 m)  Weight: 214 lb  1.6 oz (97.115 kg)  SpO2: 95%   General: Vital signs reviewed.  Patient is well-developed and well-nourished, in no acute distress and cooperative with exam. .  Cardiovascular: RRR, S1 normal, S2 normal, no murmurs, gallops, or rubs. Pulmonary/Chest: Clear to auscultation bilaterally, no wheezes, rales, or rhonchi. Abdominal: Soft, non-tender, non-distended, BS +, no masses, organomegaly, or guarding present.  Musculoskeletal: No joint deformities, erythema, or stiffness, ROM full and nontender. Extremities: No lower extremity edema bilaterally,  pulses symmetric and intact bilaterally. No cyanosis or clubbing. Skin: Hyperpigmented, non-erythematous rash on fingers bilaterally. Warm, dry and intact.  Psychiatric: Normal mood and affect. speech and behavior is normal. Cognition and memory are normal.      Assessment:         Plan:     Please see problem based assessment and plan.

## 2013-10-12 NOTE — Assessment & Plan Note (Signed)
Patient is requesting Norco refill for osteoarthritic pain. Her last refill was 3 months ago and patient only received 30 pills. She has tried to use Tylenol since she has been out, but it has not been helping. She takes one pill of 5/325 a day which helps ease her pain. I will refill her pain medication today.

## 2013-10-12 NOTE — Assessment & Plan Note (Signed)
Assessment: Patient is on atorvastatin 10 mg daily. Last lipid profile was within normal limits in January of 2015.  Plan: We will increase atorvastatin to 20 mg daily since patient is tolerating well. We will recheck lipid profile in about 4-5 months.

## 2013-10-12 NOTE — Assessment & Plan Note (Signed)
Assessment: Patient has been well controlled on Synthroid 100 mcg daily. Last TSH in 06/2103 which was 2.709. She has been within normal limits since 2007.   Plan: Continue Synthroid 100 mcg daily. Recheck TSH in about 2-3 months.

## 2013-10-12 NOTE — Progress Notes (Signed)
Patient ID: Caitlyn Aguirre, female   DOB: 01-10-1936, 78 y.o.   MRN: [id number]  Decreasing trend in BP with some complain of occasional dizziness while standing from sitting position. Will do orthostatic vitals. Decrease amlodipine to 5 mg, follow up in a month and if needed, go up on lisinopril.   I saw and evaluated the patient.  I personally confirmed the key portions of the history and exam documented by Dr. Marvel Plan and I reviewed pertinent patient test results.  The assessment, diagnosis, and plan were formulated together and I agree with the documentation in the resident's note.  Thanks, Madilyn Fireman MD MPH 10/12/2013 3:20 PM

## 2013-10-12 NOTE — Patient Instructions (Signed)
General Instructions:   Please bring your medicines with you each time you come to clinic.  Medicines may include prescription medications, over-the-counter medications, herbal remedies, eye drops, vitamins, or other pills.  1. Cholesterol: Please take Atorvastatin 2 PILLS daily. You were previously taking 1 pill daily. This will now give you a total daily dose of 20 mg.  2. High Blood Pressure: Your blood pressure was actually a little low today, so we will decrease your Amlodipine to 5 mg daily (you were previously taking 10 mg daily). You can try to take half of your current pill, or you can wait until your next prescription comes in.   3. Rash on Hands (Eczema): Use triamcinolone cream on your hands and fingers twice a day for 1-2 weeks. If the rash does not improve, we will consider a dermatology referral.     Progress Toward Treatment Goals:  Treatment Goal 03/24/2012  Hemoglobin A1C at goal  Blood pressure at goal    Self Care Goals & Plans:  Self Care Goal 10/12/2013  Manage my medications take my medicines as prescribed; bring my medications to every visit; refill my medications on time  Monitor my health check my feet daily; keep track of my blood pressure  Eat healthy foods eat more vegetables; eat foods that are low in salt; eat baked foods instead of fried foods  Be physically active find an activity I enjoy    No flowsheet data found.   Care Management & Community Referrals:  Referral 03/24/2012  Referrals made for care management support none needed    Hypotension As your heart beats, it forces blood through your arteries. This force is your blood pressure. If your blood pressure is too low for you to go about your normal activities or to support the organs of your body, you have hypotension. Hypotension is also referred to as low blood pressure. When your blood pressure becomes too low, you may not get enough blood to your brain. As a result, you may feel weak,  feel lightheaded, or develop a rapid heart rate. In a more severe case, you may faint. CAUSES Various conditions can cause hypotension. These include:  Blood loss.  Dehydration.  Heart or endocrine problems.  Pregnancy.  Severe infection.  Not having a well-balanced diet filled with needed nutrients.  Severe allergic reactions (anaphylaxis). Some medicines, such as blood pressure medicine or water pills (diuretics), may lower your blood pressure below normal. Sometimes taking too much medicine or taking medicine not as directed can cause hypotension. TREATMENT  Hospitalization is sometimes required for hypotension if fluid or blood replacement is needed, if time is needed for medicines to wear off, or if further monitoring is needed. Treatment might include changing your diet, changing your medicines (including medicines aimed at raising your blood pressure), and use of support stockings. HOME CARE INSTRUCTIONS   Drink enough fluids to keep your urine clear or pale yellow.  Take your medicines as directed by your health care provider.  Get up slowly from reclining or sitting positions. This gives your blood pressure a chance to adjust.  Wear support stockings as directed by your health care provider.  Maintain a healthy diet by including nutritious food, such as fruits, vegetables, nuts, whole grains, and lean meats. SEEK MEDICAL CARE IF:  You have vomiting or diarrhea.  You have a fever for more than 2-3 days.  You feel more thirsty than usual.  You feel weak and tired. SEEK IMMEDIATE MEDICAL CARE IF:  You have chest pain or a fast or irregular heartbeat.  You have a loss of feeling in some part of your body, or you lose movement in your arms or legs.  You have trouble speaking.  You become sweaty or feel lightheaded.  You faint. MAKE SURE YOU:   Understand these instructions.  Will watch your condition.  Will get help right away if you are not doing well or  get worse. Document Released: 01/19/2005 Document Revised: 11/09/2012 Document Reviewed: 07/22/2012 [facility] Patient Information 2015 [place], [place]. This information is not intended to replace advice given to you by your health care provider. Make sure you discuss any questions you have with your health care provider.  Hypoglycemia Hypoglycemia occurs when the glucose in your blood is too low. Glucose is a type of sugar that is your body's main energy source. Hormones, such as insulin and glucagon, control the level of glucose in the blood. Insulin lowers blood glucose and glucagon increases blood glucose. Having too much insulin in your blood stream, or not eating enough food containing sugar, can result in hypoglycemia. Hypoglycemia can happen to people with or without diabetes. It can develop quickly and can be a medical emergency.  CAUSES   Missing or delaying meals.  Not eating enough carbohydrates at meals.  Taking too much diabetes medicine.  Not timing your oral diabetes medicine or insulin doses with meals, snacks, and exercise.  Nausea and vomiting.  Certain medicines.  Severe illnesses, such as hepatitis, kidney disorders, and certain eating disorders.  Increased activity or exercise without eating something extra or adjusting medicines.  Drinking too much alcohol.  A nerve disorder that affects body functions like your heart rate, blood pressure, and digestion (autonomic neuropathy).  A condition where the stomach muscles do not function properly (gastroparesis). Therefore, medicines and food may not absorb properly.  Rarely, a tumor of the pancreas can produce too much insulin. SYMPTOMS   Hunger.  Sweating (diaphoresis).  Change in body temperature.  Shakiness.  Headache.  Anxiety.  Lightheadedness.  Irritability.  Difficulty concentrating.  Dry mouth.  Tingling or numbness in the hands or feet.  Restless sleep or sleep disturbances.  Altered  speech and coordination.  Change in mental status.  Seizures or prolonged convulsions.  Combativeness.  Drowsiness (lethargic).  Weakness.  Increased heart rate or palpitations.  Confusion.  Pale, gray skin color.  Blurred or double vision.  Fainting. DIAGNOSIS  A physical exam and medical history will be performed. Your caregiver may make a diagnosis based on your symptoms. Blood tests and other lab tests may be performed to confirm a diagnosis. Once the diagnosis is made, your caregiver will see if your signs and symptoms go away once your blood glucose is raised.  TREATMENT  Usually, you can easily treat your hypoglycemia when you notice symptoms.  Check your blood glucose. If it is less than 70 mg/dl, take one of the following:   3-4 glucose tablets.    cup juice.    cup regular soda.   1 cup skim milk.   -1 tube of glucose gel.   5-6 hard candies.   Avoid high-fat drinks or food that may delay a rise in blood glucose levels.  Do not take more than the recommended amount of sugary foods, drinks, gel, or tablets. Doing so will cause your blood glucose to go too high.   Wait 10-15 minutes and recheck your blood glucose. If it is still less than 70 mg/dl or below your  target range, repeat treatment.   Eat a snack if it is more than 1 hour until your next meal.  There may be a time when your blood glucose may go so low that you are unable to treat yourself at home when you start to notice symptoms. You may need someone to help you. You may even faint or be unable to swallow. If you cannot treat yourself, someone will need to bring you to the hospital.  [place]  If you have diabetes, follow your diabetes management plan by:  Taking your medicines as directed.  Following your exercise plan.  Following your meal plan. Do not skip meals. Eat on time.  Testing your blood glucose regularly. Check your blood glucose before and after  exercise. If you exercise longer or different than usual, be sure to check blood glucose more frequently.  Wearing your medical alert jewelry that says you have diabetes.  Identify the cause of your hypoglycemia. Then, develop ways to prevent the recurrence of hypoglycemia.  Do not take a hot bath or shower right after an insulin shot.  Always carry treatment with you. Glucose tablets are the easiest to carry.  If you are going to drink alcohol, drink it only with meals.  Tell friends or family members ways to keep you safe during a seizure. This may include removing hard or sharp objects from the area or turning you on your side.  Maintain a healthy weight. SEEK MEDICAL CARE IF:   You are having problems keeping your blood glucose in your target range.  You are having frequent episodes of hypoglycemia.  You feel you might be having side effects from your medicines.  You are not sure why your blood glucose is dropping so low.  You notice a change in vision or a new problem with your vision. SEEK IMMEDIATE MEDICAL CARE IF:   Confusion develops.  A change in mental status occurs.  The inability to swallow develops.  Fainting occurs. Document Released: 01/19/2005 Document Revised: 01/24/2013 Document Reviewed: 05/18/2011 [facility] Patient Information 2015 [place], [place]. This information is not intended to replace advice given to you by your health care provider. Make sure you discuss any questions you have with your health care provider.

## 2013-10-12 NOTE — Assessment & Plan Note (Signed)
Patient refused flu shot on 10/12/13. Patient would like to reconsider flu shot in the future.

## 2013-10-12 NOTE — Assessment & Plan Note (Signed)
BP Readings from Last 3 Encounters:  10/12/13 121/64  09/19/13 115/65  06/21/13 144/77    Lab Results  Component Value Date   NA 142 06/21/2013   K 4.4 06/21/2013   CREATININE 0.86 06/21/2013    Assessment: Blood pressure control:  Controlled Progress toward BP goal:   At goal Comments: patient had a low blood pressure reading of 104/53. She admitted to occasional dizziness, but denies syncope or weakness. Orthostatic blood pressure indicated higher blood pressures in the 120s/60s and 114/60. Patient is not orthostatic.  Plan: Medications: We will continue her carvedilol 25 mg BID and lisinopril 5 mg daily, but decrease her amlodipine from 10 mg to 5 mg daily. Educational resources provided:  None Self management tools provided:  None Other plans: Patient will return in one month for blood pressure recheck. We can consider increasing lisinopril to 10 mg at this time if blood pressure can tolerate.

## 2013-10-13 LAB — MICROALBUMIN / CREATININE URINE RATIO
Creatinine, Urine: 201.7 mg/dL
Microalb Creat Ratio: 7.5 mg/g (ref 0.0–30.0)
Microalb, Ur: 1.52 mg/dL (ref 0.00–1.89)

## 2013-11-02 ENCOUNTER — Encounter: Payer: Self-pay | Admitting: *Deleted

## 2013-11-15 ENCOUNTER — Ambulatory Visit (INDEPENDENT_AMBULATORY_CARE_PROVIDER_SITE_OTHER): Payer: Medicare HMO | Admitting: Internal Medicine

## 2013-11-15 VITALS — BP 128/66 | HR 62 | Temp 98.0°F | Ht 68.0 in | Wt 216.3 lb

## 2013-11-15 DIAGNOSIS — I5032 Chronic diastolic (congestive) heart failure: Secondary | ICD-10-CM

## 2013-11-15 DIAGNOSIS — L309 Dermatitis, unspecified: Secondary | ICD-10-CM

## 2013-11-15 DIAGNOSIS — Z23 Encounter for immunization: Secondary | ICD-10-CM

## 2013-11-15 DIAGNOSIS — Z418 Encounter for other procedures for purposes other than remedying health state: Secondary | ICD-10-CM

## 2013-11-15 DIAGNOSIS — Z299 Encounter for prophylactic measures, unspecified: Secondary | ICD-10-CM

## 2013-11-15 DIAGNOSIS — I10 Essential (primary) hypertension: Secondary | ICD-10-CM

## 2013-11-15 DIAGNOSIS — Z0000 Encounter for general adult medical examination without abnormal findings: Secondary | ICD-10-CM

## 2013-11-15 NOTE — Addendum Note (Signed)
Addended by: Julieanne Manson A on: 11/15/2013 02:20 PM   Modules accepted: Orders

## 2013-11-15 NOTE — Assessment & Plan Note (Addendum)
Assessment: Stable. Patient denies chest pain, shortness of breath, fatigue, orthopnea, or increased swelling. Patient was taken off her lasix may months ago (beginning of 2015), but was not taken off of her potassium supplementation. Potassium normal in May 2015.  Plan: Discontinue KCl. Recheck BMET.  Addendum: Potassium 4.4. Discontinue KCl supplementation.

## 2013-11-15 NOTE — Assessment & Plan Note (Signed)
Patient received her flu shot today.

## 2013-11-15 NOTE — Assessment & Plan Note (Signed)
Assessment: Ezcema has improved with triamcinolone cream. Patient has no complaints.   Plan: Continue triamcinolone cream.

## 2013-11-15 NOTE — Progress Notes (Signed)
   Subjective:    Patient ID: Caitlyn Aguirre, female    DOB: November 27, 1935, 78 y.o.   MRN: [id number]  HPI Ms. Alto is a 78 yo female with PMHx of Type II DM, COPD, hypothyroidism, HLD, GERD, OA and diastolic CHF who presents to the clinic for follow up. Patient was seen one month ago for routine follow up. At that time, her blood pressure was well controlled at 121/64, but she admitted to occasional dizziness, but denied any syncope or weakness. We performed rthostatic blood pressures that showed she was not orthostatic. We continued her carvedilol 25 mg BID and lisinopril 5 mg daily, but decreased her amlodipine from 10 mg to 5 mg daily. She presents for a blood pressure recheck. Blood pressue is 128/66 and patient denies any dizziness, falls or lightheadedness.  Last visit, patient had eczema of her bilateral hands that was previously thought to be dishidrotic ezcema. I prescribed her triamcinolone cream. Patient states this has been working very well and she does not need any refills at this time.   Review of Systems General: Denies fever, chills, fatigue, change in appetite and diaphoresis.  Respiratory: Denies SOB, cough, DOE, chest tightness, and wheezing.   Cardiovascular: Denies chest pain and palpitations.  Gastrointestinal: Denies nausea, vomiting, abdominal pain, diarrhea, constipation, blood in stool and abdominal distention.  Genitourinary: Denies dysuria, urgency, frequency, hematuria, suprapubic pain and flank pain. Endocrine: Denies hot or cold intolerance, polyuria, and polydipsia. Musculoskeletal: Denies myalgias, back pain, joint swelling, arthralgias and gait problem.  Skin: Denies pallor, rash and wounds.  Neurological: Denies dizziness, headaches, weakness, lightheadedness, numbness,seizures, and syncope, Psychiatric/Behavioral: Denies mood changes, confusion, nervousness, sleep disturbance and agitation.    Objective:   Physical Exam  Filed Vitals:   11/15/13 1329  BP:  128/66  Pulse: 62  Temp: 98 F (36.7 C)  TempSrc: Oral  Height: 5\' 8"  (1.727 m)  Weight: 216 lb 4.8 oz (98.113 kg)  SpO2: 95%   General: Vital signs reviewed.  Patient is well-developed and well-nourished, in no acute distress and cooperative with exam.  Cardiovascular: RRR, S1 normal, S2 normal, no murmurs, gallops, or rubs. Pulmonary/Chest: Clear to auscultation bilaterally, no wheezes, rales, or rhonchi. Abdominal: Soft, non-tender, non-distended, BS +, no masses, organomegaly, or guarding present.  Musculoskeletal: No joint deformities, erythema, or stiffness, ROM full and nontender. Extremities: No lower extremity edema bilaterally,  pulses symmetric and intact bilaterally. No cyanosis or clubbing. Skin: Hyperpigmented, keratotic rash on 2nd fingers of right and left hands, but improving from prior. Warm, dry and intact.  Psychiatric: Normal mood and affect. speech and behavior is normal. Cognition and memory are normal.     Assessment & Plan:   Please see problem based assessment and plan.

## 2013-11-15 NOTE — Assessment & Plan Note (Signed)
BP Readings from Last 3 Encounters:  11/15/13 128/66  10/12/13 116/60  09/19/13 115/65    Lab Results  Component Value Date   NA 142 06/21/2013   K 4.4 06/21/2013   CREATININE 0.86 06/21/2013    Assessment: Blood pressure control:  Controlled Progress toward BP goal:   At goal Comments: Continue current medications. Patient denies any dizziness or falls.  Plan: Medications:  continue current medications Educational resources provided:   Self management tools provided:   Other plans: Continue carvedilol 25 mg BID, lisinopril 5 mg daily and amlodipine 5 mg daily

## 2013-11-15 NOTE — Patient Instructions (Signed)
General Instructions:   Thank you for bringing your medicines today. This helps [place] keep you safe from mistakes.  PLEASE STOP taking your potassium pills. Continue taking all of your other medications as prescribed.   Progress Toward Treatment Goals:  Treatment Goal 03/24/2012  Hemoglobin A1C at goal  Blood pressure at goal    Self Care Goals & Plans:  Self Care Goal 10/12/2013  Manage my medications take my medicines as prescribed; bring my medications to every visit; refill my medications on time  Monitor my health check my feet daily; keep track of my blood pressure  Eat healthy foods eat more vegetables; eat foods that are low in salt; eat baked foods instead of fried foods  Be physically active find an activity I enjoy    No flowsheet data found.   Care Management & Community Referrals:  Referral 03/24/2012  Referrals made for care management support none needed

## 2013-11-16 ENCOUNTER — Telehealth: Payer: Self-pay | Admitting: Internal Medicine

## 2013-11-16 LAB — BASIC METABOLIC PANEL
BUN: 12 mg/dL (ref 6–23)
CO2: 25 mEq/L (ref 19–32)
Calcium: 10.4 mg/dL (ref 8.4–10.5)
Chloride: 107 mEq/L (ref 96–112)
Creat: 0.8 mg/dL (ref 0.50–1.10)
Glucose, Bld: 132 mg/dL — ABNORMAL HIGH (ref 70–99)
Potassium: 4.4 mEq/L (ref 3.5–5.3)
Sodium: 139 mEq/L (ref 135–145)

## 2013-11-16 NOTE — Telephone Encounter (Signed)
Contacted patient to let her know that her potassium was within normal limits and she should stop taking potassium supplementation since she is no longer on lasix.

## 2013-11-20 ENCOUNTER — Other Ambulatory Visit: Payer: Self-pay | Admitting: Internal Medicine

## 2013-11-25 ENCOUNTER — Encounter: Payer: Self-pay | Admitting: Internal Medicine

## 2013-11-25 NOTE — Progress Notes (Signed)
Internal Medicine Clinic Attending  I saw and evaluated the patient.  I personally confirmed the key portions of the history and exam documented by Dr. Richardson and I reviewed pertinent patient test results.  The assessment, diagnosis, and plan were formulated together and I agree with the documentation in the resident's note. 

## 2013-12-01 ENCOUNTER — Other Ambulatory Visit: Payer: Self-pay | Admitting: Internal Medicine

## 2013-12-20 ENCOUNTER — Other Ambulatory Visit: Payer: Self-pay | Admitting: Internal Medicine

## 2014-01-12 ENCOUNTER — Emergency Department (HOSPITAL_COMMUNITY): Payer: Medicare HMO

## 2014-01-12 ENCOUNTER — Observation Stay (HOSPITAL_COMMUNITY)
Admission: EM | Admit: 2014-01-12 | Discharge: 2014-01-13 | Disposition: A | Discharge status: Home / Self Care | Payer: Medicare HMO | Attending: Internal Medicine | Admitting: Internal Medicine

## 2014-01-12 ENCOUNTER — Encounter (HOSPITAL_COMMUNITY): Payer: Self-pay | Admitting: Emergency Medicine

## 2014-01-12 DIAGNOSIS — E785 Hyperlipidemia, unspecified: Secondary | ICD-10-CM | POA: Insufficient documentation

## 2014-01-12 DIAGNOSIS — M353 Polymyalgia rheumatica: Secondary | ICD-10-CM | POA: Insufficient documentation

## 2014-01-12 DIAGNOSIS — R05 Cough: Secondary | ICD-10-CM | POA: Diagnosis present

## 2014-01-12 DIAGNOSIS — M545 Low back pain: Secondary | ICD-10-CM | POA: Insufficient documentation

## 2014-01-12 DIAGNOSIS — Z87891 Personal history of nicotine dependence: Secondary | ICD-10-CM | POA: Diagnosis not present

## 2014-01-12 DIAGNOSIS — Z7902 Long term (current) use of antithrombotics/antiplatelets: Secondary | ICD-10-CM | POA: Insufficient documentation

## 2014-01-12 DIAGNOSIS — G8929 Other chronic pain: Secondary | ICD-10-CM | POA: Diagnosis not present

## 2014-01-12 DIAGNOSIS — J189 Pneumonia, unspecified organism: Principal | ICD-10-CM | POA: Insufficient documentation

## 2014-01-12 DIAGNOSIS — I429 Cardiomyopathy, unspecified: Secondary | ICD-10-CM | POA: Diagnosis not present

## 2014-01-12 DIAGNOSIS — E039 Hypothyroidism, unspecified: Secondary | ICD-10-CM | POA: Insufficient documentation

## 2014-01-12 DIAGNOSIS — E119 Type 2 diabetes mellitus without complications: Secondary | ICD-10-CM | POA: Diagnosis not present

## 2014-01-12 DIAGNOSIS — M1990 Unspecified osteoarthritis, unspecified site: Secondary | ICD-10-CM | POA: Diagnosis not present

## 2014-01-12 DIAGNOSIS — I10 Essential (primary) hypertension: Secondary | ICD-10-CM | POA: Diagnosis present

## 2014-01-12 DIAGNOSIS — I252 Old myocardial infarction: Secondary | ICD-10-CM | POA: Diagnosis not present

## 2014-01-12 DIAGNOSIS — K219 Gastro-esophageal reflux disease without esophagitis: Secondary | ICD-10-CM | POA: Insufficient documentation

## 2014-01-12 DIAGNOSIS — F329 Major depressive disorder, single episode, unspecified: Secondary | ICD-10-CM | POA: Diagnosis not present

## 2014-01-12 DIAGNOSIS — G4733 Obstructive sleep apnea (adult) (pediatric): Secondary | ICD-10-CM | POA: Diagnosis present

## 2014-01-12 DIAGNOSIS — Z8673 Personal history of transient ischemic attack (TIA), and cerebral infarction without residual deficits: Secondary | ICD-10-CM | POA: Diagnosis not present

## 2014-01-12 DIAGNOSIS — K5790 Diverticulosis of intestine, part unspecified, without perforation or abscess without bleeding: Secondary | ICD-10-CM | POA: Insufficient documentation

## 2014-01-12 DIAGNOSIS — I420 Dilated cardiomyopathy: Secondary | ICD-10-CM | POA: Diagnosis present

## 2014-01-12 DIAGNOSIS — Z79899 Other long term (current) drug therapy: Secondary | ICD-10-CM | POA: Diagnosis not present

## 2014-01-12 DIAGNOSIS — Z7982 Long term (current) use of aspirin: Secondary | ICD-10-CM | POA: Insufficient documentation

## 2014-01-12 DIAGNOSIS — I5032 Chronic diastolic (congestive) heart failure: Secondary | ICD-10-CM | POA: Diagnosis not present

## 2014-01-12 DIAGNOSIS — R059 Cough, unspecified: Secondary | ICD-10-CM

## 2014-01-12 DIAGNOSIS — J441 Chronic obstructive pulmonary disease with (acute) exacerbation: Secondary | ICD-10-CM | POA: Diagnosis not present

## 2014-01-12 DIAGNOSIS — E1169 Type 2 diabetes mellitus with other specified complication: Secondary | ICD-10-CM | POA: Diagnosis present

## 2014-01-12 DIAGNOSIS — J449 Chronic obstructive pulmonary disease, unspecified: Secondary | ICD-10-CM | POA: Diagnosis present

## 2014-01-12 LAB — CBC WITH DIFFERENTIAL/PLATELET
Basophils Absolute: 0 10*3/uL (ref 0.0–0.1)
Basophils Relative: 0 % (ref 0–1)
Eosinophils Absolute: 0.4 10*3/uL (ref 0.0–0.7)
Eosinophils Relative: 6 % — ABNORMAL HIGH (ref 0–5)
HCT: 35.3 % — ABNORMAL LOW (ref 36.0–46.0)
Hemoglobin: 11.7 g/dL — ABNORMAL LOW (ref 12.0–15.0)
Lymphocytes Relative: 23 % (ref 12–46)
Lymphs Abs: 1.7 10*3/uL (ref 0.7–4.0)
MCH: 28.1 pg (ref 26.0–34.0)
MCHC: 33.1 g/dL (ref 30.0–36.0)
MCV: 84.9 fL (ref 78.0–100.0)
Monocytes Absolute: 0.5 10*3/uL (ref 0.1–1.0)
Monocytes Relative: 6 % (ref 3–12)
Neutro Abs: 4.7 10*3/uL (ref 1.7–7.7)
Neutrophils Relative %: 65 % (ref 43–77)
Platelets: 195 10*3/uL (ref 150–400)
RBC: 4.16 MIL/uL (ref 3.87–5.11)
RDW: 14.6 % (ref 11.5–15.5)
WBC: 7.3 10*3/uL (ref 4.0–10.5)

## 2014-01-12 LAB — CBG MONITORING, ED
Glucose-Capillary: 63 mg/dL — ABNORMAL LOW (ref 70–99)
Glucose-Capillary: 127 mg/dL — ABNORMAL HIGH (ref 70–99)

## 2014-01-12 LAB — BASIC METABOLIC PANEL
Anion gap: 12 (ref 5–15)
BUN: 10 mg/dL (ref 6–23)
CO2: 25 mEq/L (ref 19–32)
Calcium: 10 mg/dL (ref 8.4–10.5)
Chloride: 107 mEq/L (ref 96–112)
Creatinine, Ser: 0.79 mg/dL (ref 0.50–1.10)
GFR calc Af Amer: >90 mL/min (ref >90)
GFR calc non Af Amer: 79 mL/min — ABNORMAL LOW (ref >90)
Glucose, Bld: 142 mg/dL — ABNORMAL HIGH (ref 70–99)
Potassium: 4.1 mEq/L (ref 3.7–5.3)
Sodium: 144 mEq/L (ref 137–147)

## 2014-01-12 LAB — PRO B NATRIURETIC PEPTIDE: Pro B Natriuretic peptide (BNP): 54.8 pg/mL (ref 0–450)

## 2014-01-12 LAB — I-STAT TROPONIN, ED: Troponin i, poc: 0.01 ng/mL (ref 0.00–0.08)

## 2014-01-12 LAB — GLUCOSE, CAPILLARY: Glucose-Capillary: 158 mg/dL — ABNORMAL HIGH (ref 70–99)

## 2014-01-12 MED ORDER — ATORVASTATIN CALCIUM 20 MG PO TABS
20.0000 mg | ORAL_TABLET | Freq: Every day | ORAL | Status: DC
  Administered 2014-01-13: 20 mg via ORAL
  Filled 2014-01-12: qty 1

## 2014-01-12 MED ORDER — INSULIN ASPART 100 UNIT/ML ~~LOC~~ SOLN
0.0000 [IU] | Freq: Three times a day (TID) | SUBCUTANEOUS | Status: DC
  Administered 2014-01-13: 1 [IU] via SUBCUTANEOUS
  Administered 2014-01-13: 2 [IU] via SUBCUTANEOUS

## 2014-01-12 MED ORDER — CLOPIDOGREL BISULFATE 75 MG PO TABS: 75.0000 mg | ORAL_TABLET | Freq: Every day | ORAL | Status: DC

## 2014-01-12 MED ORDER — CEFTRIAXONE SODIUM 1 G IJ SOLR
1.0000 g | Freq: Once | INTRAMUSCULAR | Status: AC
  Administered 2014-01-12: 1 g via INTRAVENOUS
  Filled 2014-01-12: qty 10

## 2014-01-12 MED ORDER — LEVOTHYROXINE SODIUM 100 MCG PO TABS
100.0000 ug | ORAL_TABLET | Freq: Every day | ORAL | Status: DC
  Administered 2014-01-13: 100 ug via ORAL
  Filled 2014-01-12 (×2): qty 1

## 2014-01-12 MED ORDER — PREDNISONE 20 MG PO TABS
60.0000 mg | ORAL_TABLET | Freq: Once | ORAL | Status: AC
  Administered 2014-01-12: 60 mg via ORAL
  Filled 2014-01-12: qty 3

## 2014-01-12 MED ORDER — ALBUTEROL SULFATE (2.5 MG/3ML) 0.083% IN NEBU
5.0000 mg | INHALATION_SOLUTION | Freq: Once | RESPIRATORY_TRACT | Status: AC
  Administered 2014-01-12: 5 mg via RESPIRATORY_TRACT
  Filled 2014-01-12: qty 6

## 2014-01-12 MED ORDER — AMLODIPINE BESYLATE 5 MG PO TABS
5.0000 mg | ORAL_TABLET | Freq: Every day | ORAL | Status: DC
  Administered 2014-01-13: 5 mg via ORAL
  Filled 2014-01-12: qty 1

## 2014-01-12 MED ORDER — LISINOPRIL 5 MG PO TABS
5.0000 mg | ORAL_TABLET | Freq: Every day | ORAL | Status: DC
  Administered 2014-01-13: 5 mg via ORAL
  Filled 2014-01-12: qty 1

## 2014-01-12 MED ORDER — ASPIRIN EC 81 MG PO TBEC
81.0000 mg | DELAYED_RELEASE_TABLET | Freq: Every day | ORAL | Status: DC
  Administered 2014-01-13: 81 mg via ORAL
  Filled 2014-01-12: qty 1

## 2014-01-12 MED ORDER — DEXTROSE 5 % IV SOLN
500.0000 mg | Freq: Once | INTRAVENOUS | Status: AC
  Administered 2014-01-12: 500 mg via INTRAVENOUS
  Filled 2014-01-12: qty 500

## 2014-01-12 MED ORDER — IPRATROPIUM-ALBUTEROL 0.5-2.5 (3) MG/3ML IN SOLN
3.0000 mL | RESPIRATORY_TRACT | Status: DC
  Administered 2014-01-12 – 2014-01-13 (×4): 3 mL via RESPIRATORY_TRACT
  Filled 2014-01-12 (×5): qty 3

## 2014-01-12 MED ORDER — GUAIFENESIN-DM 100-10 MG/5ML PO SYRP
5.0000 mL | ORAL_SOLUTION | ORAL | Status: DC | PRN
  Administered 2014-01-12 – 2014-01-13 (×2): 5 mL via ORAL
  Filled 2014-01-12 (×5): qty 5

## 2014-01-12 MED ORDER — PANTOPRAZOLE SODIUM 40 MG PO TBEC
40.0000 mg | DELAYED_RELEASE_TABLET | Freq: Every day | ORAL | Status: DC
Start: 2014-01-13 — End: 2014-01-13
  Administered 2014-01-13: 40 mg via ORAL
  Filled 2014-01-12: qty 1

## 2014-01-12 MED ORDER — INSULIN GLARGINE 100 UNIT/ML ~~LOC~~ SOLN
10.0000 [IU] | Freq: Every day | SUBCUTANEOUS | Status: DC
  Administered 2014-01-12: 10 [IU] via SUBCUTANEOUS
  Filled 2014-01-12 (×2): qty 0.1

## 2014-01-12 MED ORDER — PAROXETINE HCL 20 MG PO TABS
20.0000 mg | ORAL_TABLET | Freq: Every morning | ORAL | Status: DC
  Administered 2014-01-13: 20 mg via ORAL
  Filled 2014-01-12: qty 1

## 2014-01-12 MED ORDER — HYDROCODONE-ACETAMINOPHEN 5-325 MG PO TABS: 1.0000 | ORAL_TABLET | Freq: Four times a day (QID) | ORAL | Status: DC | PRN

## 2014-01-12 MED ORDER — ALBUTEROL SULFATE (2.5 MG/3ML) 0.083% IN NEBU: 2.5000 mg | INHALATION_SOLUTION | RESPIRATORY_TRACT | Status: DC | PRN

## 2014-01-12 MED ORDER — PREDNISONE 20 MG PO TABS
40.0000 mg | ORAL_TABLET | Freq: Every day | ORAL | Status: DC
  Administered 2014-01-13: 40 mg via ORAL
  Filled 2014-01-12 (×2): qty 2

## 2014-01-12 MED ORDER — ENOXAPARIN SODIUM 40 MG/0.4ML ~~LOC~~ SOLN
40.0000 mg | SUBCUTANEOUS | Status: DC
  Administered 2014-01-12: 40 mg via SUBCUTANEOUS
  Filled 2014-01-12 (×2): qty 0.4

## 2014-01-12 MED ORDER — CARVEDILOL 25 MG PO TABS
25.0000 mg | ORAL_TABLET | Freq: Two times a day (BID) | ORAL | Status: DC
  Administered 2014-01-13: 25 mg via ORAL
  Filled 2014-01-12 (×3): qty 1

## 2014-01-12 MED ORDER — ZOLPIDEM TARTRATE 5 MG PO TABS
5.0000 mg | ORAL_TABLET | Freq: Every evening | ORAL | Status: DC | PRN
  Administered 2014-01-12: 5 mg via ORAL
  Filled 2014-01-12: qty 1

## 2014-01-12 NOTE — ED Notes (Signed)
Notified Dr. Tamera Punt of blood sugar 63.  Told to give pt [place] sandwich and orange juice.  Pt given meal.

## 2014-01-12 NOTE — ED Notes (Addendum)
Pt reports CP and SOB x2 days with SOB.  Pt denies n/v/d.  Pt reports sputum is white/yellow and is croupy.  Pt maintaining sats on room air.  Pt denies any active CP.

## 2014-01-12 NOTE — ED Notes (Signed)
Pt. returned from XR. 

## 2014-01-12 NOTE — ED Notes (Signed)
Pt c/o cough and congestion with CP with cough x 3 days; pt unsure if had fever

## 2014-01-12 NOTE — ED Notes (Signed)
Pt taken to XR.  

## 2014-01-12 NOTE — ED Notes (Signed)
Ambulated pt in hall, pt sat down to 90 without SOB.  Doctor belfi notified.

## 2014-01-12 NOTE — ED Notes (Signed)
Admit Doctor at bedside.  
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
## 2014-01-13 ENCOUNTER — Encounter (HOSPITAL_COMMUNITY): Payer: Self-pay

## 2014-01-13 LAB — GLUCOSE, CAPILLARY
Glucose-Capillary: 180 mg/dL — ABNORMAL HIGH (ref 70–99)
Glucose-Capillary: 149 mg/dL — ABNORMAL HIGH (ref 70–99)

## 2014-01-13 LAB — HEMOGLOBIN A1C
Hgb A1c MFr Bld: 7.1 % — ABNORMAL HIGH (ref <5.7)
Mean Plasma Glucose: 157 mg/dL — ABNORMAL HIGH (ref <117)

## 2014-01-13 MED ORDER — IBUPROFEN 400 MG PO TABS
400.0000 mg | ORAL_TABLET | ORAL | Status: DC | PRN
  Administered 2014-01-13: 400 mg via ORAL
  Filled 2014-01-13 (×2): qty 1

## 2014-01-13 MED ORDER — AZITHROMYCIN 500 MG PO TABS
500.0000 mg | ORAL_TABLET | Freq: Every day | ORAL | Status: DC
  Administered 2014-01-13: 500 mg via ORAL
  Filled 2014-01-13: qty 1

## 2014-01-13 MED ORDER — GUAIFENESIN-DM 100-10 MG/5ML PO SYRP: 5.0000 mL | ORAL_SOLUTION | ORAL | Status: DC | PRN

## 2014-01-13 MED ORDER — PREDNISONE 20 MG PO TABS: 40.0000 mg | ORAL_TABLET | Freq: Every day | ORAL | Status: DC

## 2014-01-13 MED ORDER — AZITHROMYCIN 500 MG PO TABS: 500.0000 mg | ORAL_TABLET | Freq: Every day | ORAL | Status: AC

## 2014-01-13 MED ORDER — AMOXICILLIN-POT CLAVULANATE 875-125 MG PO TABS
1.0000 | ORAL_TABLET | Freq: Two times a day (BID) | ORAL | Status: DC
  Administered 2014-01-13: 1 via ORAL
  Filled 2014-01-13 (×2): qty 1

## 2014-01-13 MED ORDER — AMOXICILLIN-POT CLAVULANATE 875-125 MG PO TABS: 1.0000 | ORAL_TABLET | Freq: Two times a day (BID) | ORAL | Status: AC

## 2014-01-13 NOTE — Discharge Summary (Signed)
Name: Caitlyn Aguirre MRN: [id number] DOB: Jun 03, 1935 78 y.o. PCP: Axel Filler, MD  Date of Admission: 01/12/2014  2:42 PM Date of Discharge: 01/13/2014 Attending Physician: Madilyn Fireman, MD  Discharge Diagnosis: Principal Problem:   COPD exacerbation Active Problems:   Diabetes 1.5, managed as type 2   Essential hypertension   Chronic diastolic heart failure   CHRONIC OBSTRUCTIVE PULMONARY DISEASE, ACUTE EXACERBATION   Obstructive sleep apnea  Discharge Medications:   Medication List    STOP taking these medications        clopidogrel 75 MG tablet  Commonly known as:  PLAVIX     fluconazole 150 MG tablet  Commonly known as:  DIFLUCAN      TAKE these medications        ACCU-CHEK FASTCLIX LANCETS Misc  Use to check blood sugar 2 times daily. diag code 250.00. Non-insulin dependent     albuterol 108 (90 BASE) MCG/ACT inhaler  Commonly known as:  PROAIR HFA  Inhale 2 puffs into the lungs every 6 (six) hours as needed for wheezing or shortness of breath.     amLODipine 5 MG tablet  Commonly known as:  NORVASC  TAKE 1 TABLET BY MOUTH EVERY DAY     amoxicillin-clavulanate 875-125 MG per tablet  Commonly known as:  AUGMENTIN  Take 1 tablet by mouth every 12 (twelve) hours.     aspirin EC 81 MG tablet  Take 1 tablet (81 mg total) by mouth daily.     atorvastatin 20 MG tablet  Commonly known as:  LIPITOR  Take 1 tablet (20 mg total) by mouth daily.     azithromycin 500 MG tablet  Commonly known as:  ZITHROMAX  Take 1 tablet (500 mg total) by mouth daily.     carvedilol 25 MG tablet  Commonly known as:  COREG  Take 1 tablet (25 mg total) by mouth 2 (two) times daily with a meal.     docusate sodium 100 MG capsule  Commonly known as:  COLACE  Take 100 mg by mouth daily as needed for moderate constipation.     fluticasone 110 MCG/ACT inhaler  Commonly known as:  FLOVENT HFA  Inhale 1 puff into the lungs 2 (two) times daily as needed. For shortness of  breath     GLIPIZIDE XL 5 MG 24 hr tablet  Generic drug:  glipiZIDE  TAKE 1 TABLET BY MOUTH EVERY DAY     glucose blood test strip  Commonly known as:  ACCU-CHEK SMARTVIEW  Use as instructed     guaiFENesin-dextromethorphan 100-10 MG/5ML syrup  Commonly known as:  ROBITUSSIN DM  Take 5 mLs by mouth every 4 (four) hours as needed for cough.     HYDROcodone-acetaminophen 5-325 MG per tablet  Commonly known as:  NORCO/VICODIN  Take 1 tablet by mouth every 6 (six) hours as needed for moderate pain.     ibuprofen 200 MG tablet  Commonly known as:  ADVIL,MOTRIN  Take 200 mg by mouth every 6 (six) hours as needed. For pain     levothyroxine 100 MCG tablet  Commonly known as:  SYNTHROID, LEVOTHROID  TAKE ONE TABLET BY MOUTH EVERY DAY     lisinopril 5 MG tablet  Commonly known as:  PRINIVIL,ZESTRIL  TAKE 1 TABLET BY MOUTH DAILY     metFORMIN 1000 MG tablet  Commonly known as:  GLUCOPHAGE  Take 1 tablet (1,000 mg total) by mouth 2 (two) times daily with a meal.  omeprazole 20 MG capsule  Commonly known as:  PRILOSEC  TAKE 1 CAPSULE BY MOUTH EVERY DAY     ondansetron 4 MG tablet  Commonly known as:  ZOFRAN  Take 1 tablet (4 mg total) by mouth daily as needed for nausea or vomiting.     PARoxetine 20 MG tablet  Commonly known as:  PAXIL  TAKE 1 TABLET BY MOUTH EVERY MORNING     predniSONE 20 MG tablet  Commonly known as:  DELTASONE  Take 2 tablets (40 mg total) by mouth daily with breakfast.     SPIRIVA HANDIHALER 18 MCG inhalation capsule  Generic drug:  tiotropium  INHALE THE CONTENTS OF 1 CAPSULE VIA HANDIHALER BY MOUTH EVERY DAY     triamcinolone ointment 0.5 %  Commonly known as:  KENALOG  Apply 1 application topically 2 (two) times daily. Use for 1-2 weeks     zolpidem 5 MG tablet  Commonly known as:  AMBIEN  TAKE 1 TABLET BY MOUTH EVERY NIGHT AT BEDTIME AS NEEDED        Disposition and follow-up:   Caitlyn Aguirre was discharged from [facility] in Stable condition.  At the hospital follow up visit please address:  1.  CAP: was discharged on Augmentin 875-125mg  BID, azithromycin 500mg  daily for total 5 day course of abx. She was also instructed to stop taking her Plavix as was previously noted in encounters.  2.  Labs / imaging needed at time of follow-up: None  3.  Pending labs/ test needing follow-up: None  Follow-up Appointments:     Follow-up Information    Follow up with Axel Filler, MD. Schedule an appointment as soon as possible for a visit in 1 week.   Specialty:  Internal Medicine   Why:  The office will call you with a follow up appointment in 1-2 weeks on Monday or Tuesday. If you do not receive a phone call by then, please call to set that up.   Contact information:   [place] [address] [phone]       Discharge Instructions: Discharge Instructions    Call MD for:  difficulty breathing, headache or visual disturbances    Complete by:  As directed      Call MD for:  hives    Complete by:  As directed      Call MD for:  persistant dizziness or light-headedness    Complete by:  As directed      Call MD for:  persistant nausea and vomiting    Complete by:  As directed      Call MD for:  severe uncontrolled pain    Complete by:  As directed      Call MD for:  temperature >100.4    Complete by:  As directed      Diet - low sodium heart healthy    Complete by:  As directed      Increase activity slowly    Complete by:  As directed            Consultations: None  Procedures Performed:  Dg Chest 2 View  01/12/2014   CLINICAL DATA:  Cough. Congestion. Body aches and headaches. Fever. Hypertension and diabetes. Collagen vascular disease. Bronchitis.  EXAM: CHEST  2 VIEW  COMPARISON:  02/08/2013  FINDINGS: Continued right hilar prominence. Tortuous thoracic aorta. Mild enlargement of the cardiopericardial silhouette. Interstitial accentuation persists in both  lungs with mild bibasilar atelectasis or  scarring. Left lateral rib deformities from old fractures. Thoracic spondylosis. No pleural effusion observed.  IMPRESSION: 1. Abnormal interstitial accentuation, mildly worsened from prior, possibly from atypical pneumonia and or edema. 2. Mild bibasilar subsegmental atelectasis or scarring. 3. Mild cardiomegaly. 4. Chronic right infrahilar prominence, likely from vasculature.   Electronically Signed   By: Sherryl Barters M.D.   On: 01/12/2014 15:12    Admission HPI: Ms. Moon is a 78yo woman w/ PMHx of Type 2 DM (last HbA1c 6.6 on 10/12/13), COPD, hypothyroidism, HLD, GERD, and diastolic CHF (last echo in 08/2008 shows EF 34%, grade I diastolic dysfunction) who presents to the ED with a 2-3 day history of cough and shortness of breath. She states her cough has been productive of clear mucus. She also notes subjective fever, wheezing, and chest pain on both sides of her chest only when she coughs. She denies any exertional chest pain. She states she tried taking benadryl, OTC cough syrup, and her albuterol inhaler with mild relief. She reports she feels more short of breath than usual. She denies congestion, sore throat, nausea, vomiting, and swelling in her legs. She denies any sick contacts.   In the ED, pro-BNP normal at 55 and troponin negative x 1. CXR shows abnormal interstitial accentuation and a chronic right infrahilar prominence. She received Rocephin 1 g, Azithromycin 500 mg, and Prednisone 60 mg in the ED.   Hospital Course by problem list: Principal Problem:   COPD exacerbation Active Problems:   Diabetes 1.5, managed as type 2   Essential hypertension   Chronic diastolic heart failure   CHRONIC OBSTRUCTIVE PULMONARY DISEASE, ACUTE EXACERBATION   Obstructive sleep apnea   1. Mild COPD Exacerbation, Community Acquired Pneumonia: Patient presented with 2-3 days history of productive cough, subjective fever, SOB, and pleuritic chest pain. On exam,  she was afebrile and had wheezing and rhonchorous breath sounds. Given her hx of COPD and mild improvement after using her inhaler this is likely related to her COPD. Her Wells score is 0 and she was saturating 95-100% on room air. Acute CHF exacerbation could also be considered, but her pro-BNP was not elevated and she has no signs of fluid overload on exam. She was admitted with albuterol nebs Q2H PRN, Duonebs Q4H. She was continued on Prednisone to 40 mg daily. She was transitioned to Augmentin 875-125mg  BID, azithromycin 500mg  daily for total 5 day course of abx. Her prednisone was continued for total 5 day course. At discharge, she was breathing well without wheezes.  2. Type 2 DM: Last HbA1c 6.6 on 10/12/13. Patient takes Glipizide 5 mg 24 hr tablet and Metformin 1000 mg BID at home. Held home Glipizide and Metformin during hospitalization. She was started on Lantus 10u QHS and SSI. Her home meds were restarted upon discharge.  3. HTN: BP 131/70 on admission. She takes Amlodipine 5 mg daily, Coreg 25 mg BID, and Lisinopril 5 mg daily at home. Continued her home meds.  4. Chronic Diastolic Heart Failure: Last echo in 08/2008 shows EF 74%, grade I diastolic dysfunction. Patient takes aspirin 81 mg daily, Amlodipine 5 mg daily, Coreg 25 mg BID, and Lisinopril 5 mg daily at home. She also has Plavix 75 mg daily on her med list, which was initially prescribed for a TIA in 2009. However, per records this medication was discontinued in February 2015 since she has not had any additional events. Patient has requested this medication several times despite being instructed to stop. Continued home meds, except for  Plavix  5. Hypothyroidism: Patient takes levothyroxine 100 mcg daily at home. Continued her home Levothyroxine.  6. HLD: Patient takes Atorvastatin 20 mg daily at home. Continued home Atorvastatin.  7. Depression: Patient takes Paxil 20 mg daily at home. Continued home Paxil.  Discharge Vitals:     BP 158/86 mmHg  Pulse 75  Temp(Src) 98.7 F (37.1 C) (Oral)  Resp 22  Ht 5\' 8"  (1.727 m)  Wt 219 lb 5.7 oz (99.5 kg)  BMI 33.36 kg/m2  SpO2 95%  Discharge Labs:  Results for orders placed or performed during the hospital encounter of 01/12/14 (from the past 24 hour(s))  Basic metabolic panel     Status: Abnormal   Collection Time: 01/12/14  2:03 PM  Result Value Ref Range   Sodium 144 137 - 147 mEq/L   Potassium 4.1 3.7 - 5.3 mEq/L   Chloride 107 96 - 112 mEq/L   CO2 25 19 - 32 mEq/L   Glucose, Bld 142 (H) 70 - 99 mg/dL   BUN 10 6 - 23 mg/dL   Creatinine, Ser 0.79 0.50 - 1.10 mg/dL   Calcium 10.0 8.4 - 10.5 mg/dL   GFR calc non Af Amer 79 (L) >90 mL/min   GFR calc Af Amer >90 >90 mL/min   Anion gap 12 5 - 15  BNP (order ONLY if patient complains of dyspnea/SOB AND you have documented it for THIS visit)     Status: None   Collection Time: 01/12/14  2:03 PM  Result Value Ref Range   Pro B Natriuretic peptide (BNP) 54.8 0 - 450 pg/mL  CBC with Differential     Status: Abnormal   Collection Time: 01/12/14  2:03 PM  Result Value Ref Range   WBC 7.3 4.0 - 10.5 K/uL   RBC 4.16 3.87 - 5.11 MIL/uL   Hemoglobin 11.7 (L) 12.0 - 15.0 g/dL   HCT 35.3 (L) 36.0 - 46.0 %   MCV 84.9 78.0 - 100.0 fL   MCH 28.1 26.0 - 34.0 pg   MCHC 33.1 30.0 - 36.0 g/dL   RDW 14.6 11.5 - 15.5 %   Platelets 195 150 - 400 K/uL   Neutrophils Relative % 65 43 - 77 %   Neutro Abs 4.7 1.7 - 7.7 K/uL   Lymphocytes Relative 23 12 - 46 %   Lymphs Abs 1.7 0.7 - 4.0 K/uL   Monocytes Relative 6 3 - 12 %   Monocytes Absolute 0.5 0.1 - 1.0 K/uL   Eosinophils Relative 6 (H) 0 - 5 %   Eosinophils Absolute 0.4 0.0 - 0.7 K/uL   Basophils Relative 0 0 - 1 %   Basophils Absolute 0.0 0.0 - 0.1 K/uL  I-stat troponin, ED (not at [facility])     Status: None   Collection Time: 01/12/14  2:18 PM  Result Value Ref Range   Troponin i, poc 0.01 0.00 - 0.08 ng/mL   Comment 3          POC CBG, ED     Status: Abnormal    Collection Time: 01/12/14  4:22 PM  Result Value Ref Range   Glucose-Capillary 63 (L) 70 - 99 mg/dL  CBG monitoring, ED     Status: Abnormal   Collection Time: 01/12/14  6:15 PM  Result Value Ref Range   Glucose-Capillary 127 (H) 70 - 99 mg/dL  Glucose, capillary     Status: Abnormal   Collection Time: 01/12/14  9:59 PM  Result Value Ref Range  Glucose-Capillary 158 (H) 70 - 99 mg/dL   Comment 1 Documented in Chart    Comment 2 Notify RN   Hemoglobin A1c     Status: Abnormal   Collection Time: 01/12/14 11:00 PM  Result Value Ref Range   Hgb A1c MFr Bld 7.1 (H) <5.7 %   Mean Plasma Glucose 157 (H) <117 mg/dL  Glucose, capillary     Status: Abnormal   Collection Time: 01/13/14  8:02 AM  Result Value Ref Range   Glucose-Capillary 180 (H) 70 - 99 mg/dL  Glucose, capillary     Status: Abnormal   Collection Time: 01/13/14 12:04 PM  Result Value Ref Range   Glucose-Capillary 149 (H) 70 - 99 mg/dL    Signed: Jacques Earthly, MD 01/13/2014, 1:45 PM    Services Ordered on Discharge: None Equipment Ordered on Discharge: None

## 2014-01-13 NOTE — Progress Notes (Signed)
UR completed 

## 2014-01-13 NOTE — Progress Notes (Signed)
Subjective: Doing well this morning. SOB improved. Reports cough with yellow sputum. Denies chest pain, N/V, abdominal pain, diarrhea.   Objective: Vital signs in last 24 hours: Filed Vitals:   01/13/14 0112 01/13/14 0332 01/13/14 0511 01/13/14 0856  BP:   158/86   Pulse:   75   Temp:   98.7 F (37.1 C)   TempSrc:   Oral   Resp:   22   Height: 5\' 8"  (1.727 m)     Weight: 219 lb 5.7 oz (99.5 kg)     SpO2:  97% 94% 95%   Weight change:   Intake/Output Summary (Last 24 hours) at 01/13/14 1340 Last data filed at 01/13/14 1013  Gross per 24 hour  Intake    840 ml  Output      0 ml  Net    840 ml   General: alert, sitting up in chair, NAD HEENT: [place]/AT, EOMI, sclera anicteric, pharynx non-erythematous, mucus membranes moist CV: RRR, normal S1/S2, no m/g/r Pulm: rhonchorous breath sounds bilaterally, no wheezing, breaths non-labored Abd: BS+, soft, non-tender Ext: warm, no edema, moves all Neuro: alert and oriented x 3, CNs II-XII intact, strength intact  Lab Results: Basic Metabolic Panel:  Recent Labs Lab 01/12/14 1403  NA 144  K 4.1  CL 107  CO2 25  GLUCOSE 142*  BUN 10  CREATININE 0.79  CALCIUM 10.0   CBC:  Recent Labs Lab 01/12/14 1403  WBC 7.3  NEUTROABS 4.7  HGB 11.7*  HCT 35.3*  MCV 84.9  PLT 195   BNP:  Recent Labs Lab 01/12/14 1403  PROBNP 54.8   CBG:  Recent Labs Lab 01/12/14 1622 01/12/14 1815 01/12/14 2159 01/13/14 0802 01/13/14 1204  GLUCAP 63* 127* 158* 180* 149*   Hemoglobin A1C:  Recent Labs Lab 01/12/14 2300  HGBA1C 7.1*   Micro Results: No results found for this or any previous visit (from the past 240 hour(s)). Studies/Results: Dg Chest 2 View  01/12/2014   CLINICAL DATA:  Cough. Congestion. Body aches and headaches. Fever. Hypertension and diabetes. Collagen vascular disease. Bronchitis.  EXAM: CHEST  2 VIEW  COMPARISON:  02/08/2013  FINDINGS: Continued right hilar prominence. Tortuous thoracic aorta. Mild  enlargement of the cardiopericardial silhouette. Interstitial accentuation persists in both lungs with mild bibasilar atelectasis or scarring. Left lateral rib deformities from old fractures. Thoracic spondylosis. No pleural effusion observed.  IMPRESSION: 1. Abnormal interstitial accentuation, mildly worsened from prior, possibly from atypical pneumonia and or edema. 2. Mild bibasilar subsegmental atelectasis or scarring. 3. Mild cardiomegaly. 4. Chronic right infrahilar prominence, likely from vasculature.   Electronically Signed   By: Sherryl Barters M.D.   On: 01/12/2014 15:12   Medications: I have reviewed the patient's current medications. Scheduled Meds: . amLODipine  5 mg Oral Daily  . amoxicillin-clavulanate  1 tablet Oral Q12H  . aspirin EC  81 mg Oral Daily  . atorvastatin  20 mg Oral Daily  . azithromycin  500 mg Oral Daily  . carvedilol  25 mg Oral BID WC  . enoxaparin (LOVENOX) injection  40 mg Subcutaneous Q24H  . insulin aspart  0-9 Units Subcutaneous TID WC  . insulin glargine  10 Units Subcutaneous QHS  . ipratropium-albuterol  3 mL Nebulization Q4H  . levothyroxine  100 mcg Oral QAC breakfast  . lisinopril  5 mg Oral Daily  . pantoprazole  40 mg Oral Daily  . PARoxetine  20 mg Oral q morning - 10a  . predniSONE  40  mg Oral Q breakfast   Continuous Infusions:  PRN Meds:.albuterol, guaiFENesin-dextromethorphan, HYDROcodone-acetaminophen, ibuprofen, zolpidem Assessment/Plan: Principal Problem:   COPD exacerbation Active Problems:   Diabetes 1.5, managed as type 2   Essential hypertension   Chronic diastolic heart failure   CHRONIC OBSTRUCTIVE PULMONARY DISEASE, ACUTE EXACERBATION   Obstructive sleep apnea  Mild COPD Exacerbation, Community Acquired Pneumonia: Improving - Albuterol nebs Q2H PRN - Duonebs Q4H - Prednisone to 40 mg daily x 5 days total - Robitussin Q4H PRN - Transition to PO abx: Augmentin 875-125mg  BID, azithromycin 500mg  daily for total 5 day  course of abx - Oxygen therapy as needed  Type 2 DM: Last HbA1c 6.6 on 10/12/13. Patient takes Glipizide 5 mg 24 hr tablet and Metformin 1000 mg BID at home. - Hold home Glipizide and Metformin - Start Lantus 10 units QHS - Start sensitive ISS - CBGs 4 times daily   HTN: BP 131/70 on admission. She takes Amlodipine 5 mg daily, Coreg 25 mg BID, and Lisinopril 5 mg daily at home.  - Continue home meds  Chronic Diastolic Heart Failure: Last echo in 08/2008 shows EF 09%, grade I diastolic dysfunction. Patient takes aspirin 81 mg daily, Amlodipine 5 mg daily, Coreg 25 mg BID, and Lisinopril 5 mg daily at home. - Continue home meds, except for Plavix - Ensure patient understands not to be taking Plavix  Hypothyroidism: Patient takes levothyroxine 100 mcg daily at home. - Continue home Levothyroxine  HLD: Patient takes Atorvastatin 20 mg daily at home. - Continue home Atorvastatin  Depression: Patient takes Paxil 20 mg daily at home. - Continue home Paxil  Diet: Regular DVT/PE PPx: Lovenox SQ   Dispo: Likely home today  The patient does have a current PCP Axel Filler, MD) and does need an [facility] hospital follow-up appointment after discharge.  The patient does not have transportation limitations that hinder transportation to clinic appointments.  .Services Needed at time of discharge: Y = Yes, Blank = No PT:   OT:   RN:   Equipment:   Other:     LOS: 1 day   Jacques Earthly, MD 01/13/2014, 1:40 PM

## 2014-01-17 ENCOUNTER — Other Ambulatory Visit: Payer: Self-pay | Admitting: Internal Medicine

## 2014-01-18 NOTE — Telephone Encounter (Signed)
Refill approved - nurse to call in. 

## 2014-01-19 NOTE — Telephone Encounter (Signed)
Called to pharm 

## 2014-02-14 ENCOUNTER — Other Ambulatory Visit: Payer: Self-pay | Admitting: Dietician

## 2014-02-14 ENCOUNTER — Encounter: Payer: Self-pay | Admitting: Internal Medicine

## 2014-02-14 ENCOUNTER — Ambulatory Visit (HOSPITAL_COMMUNITY)
Admission: RE | Admit: 2014-02-14 | Discharge: 2014-02-14 | Disposition: A | Discharge status: Home / Self Care | Payer: Medicare HMO | Source: Ambulatory Visit | Attending: Internal Medicine | Admitting: Internal Medicine

## 2014-02-14 ENCOUNTER — Encounter: Payer: Medicare HMO | Admitting: Internal Medicine

## 2014-02-14 ENCOUNTER — Ambulatory Visit: Payer: Commercial Managed Care - HMO | Admitting: Dietician

## 2014-02-14 ENCOUNTER — Encounter: Payer: Self-pay | Admitting: Dietician

## 2014-02-14 ENCOUNTER — Ambulatory Visit (INDEPENDENT_AMBULATORY_CARE_PROVIDER_SITE_OTHER): Payer: Commercial Managed Care - HMO | Admitting: Internal Medicine

## 2014-02-14 VITALS — BP 139/66 | HR 61 | Temp 97.3°F | Wt 217.4 lb

## 2014-02-14 DIAGNOSIS — Z299 Encounter for prophylactic measures, unspecified: Secondary | ICD-10-CM

## 2014-02-14 DIAGNOSIS — M1990 Unspecified osteoarthritis, unspecified site: Secondary | ICD-10-CM

## 2014-02-14 DIAGNOSIS — Z1231 Encounter for screening mammogram for malignant neoplasm of breast: Secondary | ICD-10-CM

## 2014-02-14 DIAGNOSIS — E785 Hyperlipidemia, unspecified: Secondary | ICD-10-CM

## 2014-02-14 DIAGNOSIS — F32A Depression, unspecified: Secondary | ICD-10-CM

## 2014-02-14 DIAGNOSIS — E139 Other specified diabetes mellitus without complications: Secondary | ICD-10-CM

## 2014-02-14 DIAGNOSIS — E039 Hypothyroidism, unspecified: Secondary | ICD-10-CM

## 2014-02-14 DIAGNOSIS — Z8701 Personal history of pneumonia (recurrent): Secondary | ICD-10-CM | POA: Insufficient documentation

## 2014-02-14 DIAGNOSIS — I10 Essential (primary) hypertension: Secondary | ICD-10-CM

## 2014-02-14 DIAGNOSIS — D649 Anemia, unspecified: Secondary | ICD-10-CM

## 2014-02-14 DIAGNOSIS — F329 Major depressive disorder, single episode, unspecified: Secondary | ICD-10-CM

## 2014-02-14 DIAGNOSIS — G4733 Obstructive sleep apnea (adult) (pediatric): Secondary | ICD-10-CM

## 2014-02-14 DIAGNOSIS — J449 Chronic obstructive pulmonary disease, unspecified: Secondary | ICD-10-CM | POA: Insufficient documentation

## 2014-02-14 DIAGNOSIS — J189 Pneumonia, unspecified organism: Secondary | ICD-10-CM | POA: Diagnosis present

## 2014-02-14 LAB — COMPLETE METABOLIC PANEL WITH GFR
ALT: 9 U/L (ref 0–35)
AST: 13 U/L (ref 0–37)
Albumin: 4 g/dL (ref 3.5–5.2)
Alkaline Phosphatase: 125 U/L — ABNORMAL HIGH (ref 39–117)
BUN: 12 mg/dL (ref 6–23)
CO2: 26 mEq/L (ref 19–32)
Calcium: 10.4 mg/dL (ref 8.4–10.5)
Chloride: 104 mEq/L (ref 96–112)
Creat: 0.83 mg/dL (ref 0.50–1.10)
GFR, Est African American: 79 mL/min
GFR, Est Non African American: 69 mL/min
Glucose, Bld: 166 mg/dL — ABNORMAL HIGH (ref 70–99)
Potassium: 4.5 mEq/L (ref 3.5–5.3)
Sodium: 140 mEq/L (ref 135–145)
Total Bilirubin: 0.6 mg/dL (ref 0.2–1.2)
Total Protein: 6.8 g/dL (ref 6.0–8.3)

## 2014-02-14 LAB — TSH: TSH: 1.688 u[IU]/mL (ref 0.350–4.500)

## 2014-02-14 LAB — LIPID PANEL
Cholesterol: 155 mg/dL (ref 0–200)
HDL: 50 mg/dL (ref >39)
LDL Cholesterol: 87 mg/dL (ref 0–99)
Total CHOL/HDL Ratio: 3.1 Ratio
Triglycerides: 90 mg/dL (ref <150)
VLDL: 18 mg/dL (ref 0–40)

## 2014-02-14 LAB — CBC WITH DIFFERENTIAL/PLATELET
Basophils Absolute: 0 10*3/uL (ref 0.0–0.1)
Basophils Relative: 0 % (ref 0–1)
Eosinophils Absolute: 0.3 10*3/uL (ref 0.0–0.7)
Eosinophils Relative: 5 % (ref 0–5)
HCT: 39 % (ref 36.0–46.0)
Hemoglobin: 12.7 g/dL (ref 12.0–15.0)
Lymphocytes Relative: 26 % (ref 12–46)
Lymphs Abs: 1.8 10*3/uL (ref 0.7–4.0)
MCH: 27.9 pg (ref 26.0–34.0)
MCHC: 32.6 g/dL (ref 30.0–36.0)
MCV: 85.7 fL (ref 78.0–100.0)
MPV: 10.8 fL (ref 8.6–12.4)
Monocytes Absolute: 0.6 10*3/uL (ref 0.1–1.0)
Monocytes Relative: 9 % (ref 3–12)
Neutro Abs: 4.1 10*3/uL (ref 1.7–7.7)
Neutrophils Relative %: 60 % (ref 43–77)
Platelets: 211 10*3/uL (ref 150–400)
RBC: 4.55 MIL/uL (ref 3.87–5.11)
RDW: 16.1 % — ABNORMAL HIGH (ref 11.5–15.5)
WBC: 6.9 10*3/uL (ref 4.0–10.5)

## 2014-02-14 LAB — HM DIABETES EYE EXAM

## 2014-02-14 NOTE — Assessment & Plan Note (Signed)
Lab Results  Component Value Date   HGB 11.7* 01/12/2014   HGB 12.9 06/21/2013   HGB 13.6 02/08/2013    Lab Results  Component Value Date   MCV 84.9 01/12/2014   MCV 85.7 06/21/2013   MCV 87.4 03/24/2012     Assessment: Patient had a mild normocytic anemia noted in December.  Plan: Check a CBC with differential today; work up further if anemia confirmed.

## 2014-02-14 NOTE — Patient Instructions (Signed)
Continue current medications. A referral has been made for a screening mammogram.

## 2014-02-14 NOTE — Assessment & Plan Note (Signed)
Assessment: Patient reports that she is doing well, with no active symptoms of depression, on paroxetine 20 mg daily.  Plan: Continue paroxetine 20 mg daily; consider a trial of tapering when patient follows up.

## 2014-02-14 NOTE — Assessment & Plan Note (Signed)
Assessment:  Patient reports chronic bilateral knee pain and low back pain, for which she has been treated with hydrocodone-acetaminophen under a medication contract in our clinic.  She she last refilled the hydrocodone-acetaminophen in September for #60, and has therefore been out of that medication for more than 2 months.  She reports that her pain has been poorly controlled despite taking over-the-counter ibuprofen and Aleve.  Plan: After reviewing chart, will refill hydrocodone-acetaminophen 5-325 milligrams 1 tablet twice a day as needed for pain, #60, as per her medication contract.  Patient will return to clinic to pick up the prescription.  Will consider obtaining bilateral knee and lumbosacral spine x-rays at the time of her next visit.

## 2014-02-14 NOTE — Progress Notes (Signed)
Retinal images were done and transmistted. Patient also requested referral to eye doctor for glasses. Nurse informed.

## 2014-02-14 NOTE — Assessment & Plan Note (Signed)
Lab Results  Component Value Date   TSH 2.709 06/21/2013     Assessment: Patient is doing well, with no symptoms of thyroid dysfunction, on levothyroxine 100 g daily.  Plan: Check TSH today; continue levothyroxine 100 g daily pending the result.

## 2014-02-14 NOTE — Assessment & Plan Note (Addendum)
Dg Chest 2 View  02/14/2014   CLINICAL DATA:  Chronic obstructive pulmonary disease, followup pneumonia  EXAM: CHEST  2 VIEW  COMPARISON:  Chest x-ray of 01/12/2014 and 02/08/2013  FINDINGS: Prominent interstitial markings are stable. Also prominent hilar markings remain consistent with pulmonary arterial hypertension. No definite adenopathy is seen. Basilar fibrosis is stable. Mild cardiomegaly is unchanged. There are degenerative changes throughout the thoracic spine.  IMPRESSION: Stable chronic change.  No definite active process.   Electronically Signed   By: Ivar Drape M.D.   On: 02/14/2014 14:51    SpO2 Readings from Last 3 Encounters:  02/14/14 96%  01/13/14 95%  11/15/13 95%    Assessment: Her prior workup included a chest CT scan done in 2006 which showed evidence of COPD, and pulmonary function tests done in May 2013 which showed evidence of restrictive lung disease. Patient's respiratory status is stable following her hospitalization in December; she has some baseline exertional shortness of breath for which he uses her albuterol inhaler as needed.  She also is using Spiriva.  Chest x-ray today shows stable chronic change.  Plan: Continue current inhaled bronchodilators; review CXR and prior pulmonary function tests with pulmonology to determine whether further workup is warranted.

## 2014-02-14 NOTE — Assessment & Plan Note (Signed)
Lipids:    Component Value Date/Time   CHOL 176 02/17/2013 1414   TRIG 138 02/17/2013 1414   HDL 47 02/17/2013 1414   LDLCALC 101* 02/17/2013 1414   VLDL 28 02/17/2013 1414   CHOLHDL 3.7 02/17/2013 1414    Assessment: Patient is doing well on atorvastatin 20 mg daily.  Plan:  Check a lipid panel today; continue atorvastatin 20 mg daily pending the results.

## 2014-02-14 NOTE — Assessment & Plan Note (Signed)
Lab Results  Component Value Date   HGBA1C 7.1* 01/12/2014   HGBA1C 6.6 10/12/2013   HGBA1C 6.8 06/21/2013     Assessment: Diabetes control: fair control Progress toward A1C goal:  unchanged Comments: Hemoglobin A1c is only slightly above goal on glipizide XL 5 mg daily and metformin 1000 mg twice a day.  Plan: Medications:  continue current medications Home glucose monitoring: Frequency: no home glucose monitoring Instruction/counseling given: reminded to get eye exam Other plans: Check comprehensive metabolic panel today; ophthalmology referral for eye exam.

## 2014-02-14 NOTE — Assessment & Plan Note (Signed)
Assessment: Patient had severe obstructive sleep apnea/hypopnea syndrome documented on a sleep study 12/15/2011.  She reports that she does not have a CPAP machine because she could not afford the co-pay when this was initially ordered.  Plan: Will ask clinic social worker to look into this; patient would definitely benefit from CPAP if it can be provided.

## 2014-02-14 NOTE — Assessment & Plan Note (Signed)
BP Readings from Last 3 Encounters:  02/14/14 139/66  01/13/14 158/86  11/15/13 128/66    Lab Results  Component Value Date   NA 144 01/12/2014   K 4.1 01/12/2014   CREATININE 0.79 01/12/2014    Assessment: Blood pressure control: controlled Progress toward BP goal:  at goal Comments: Blood pressure is well controlled on amlodipine 5 mg daily, carvedilol 25 mg twice a day, and lisinopril 5 mg daily.  Plan: Medications:  continue current medications

## 2014-02-14 NOTE — Progress Notes (Signed)
   Subjective:    Patient ID: Caitlyn Aguirre, female    DOB: Jul 01, 1935, 79 y.o.   MRN: [id number]  HPI This is the first visit to my continuity clinic for Caitlyn Aguirre, a 79 year old woman who was previously followed by Dr. Marvel Plan as PCP and who has been reassigned to my continuity clinic.  Patient has no acute complaints today, and she reports that she has been doing well.  She was hospitalized 01/12/14-01/13/14 for treatment of a COPD exacerbation and community-acquired pneumonia, and was discharged home then on a course of Augmentin and azithromycin to complete 5 days of antibiotic treatment.  She returns for follow-up of that hospital admission, and for management of her chronic medical problems including diabetes mellitus, osteoarthritis, depression, hyperlipidemia, and other medical problems.  She reports that she has done well since her hospital discharge, with no symptoms of recurrent pneumonia.  She has baseline chronic occasional cough and stable chronic exertional shortness of breath, for which she uses her albuterol inhaler.  Although her medication list shows a Flovent inhaler, she reports that she has never been on that medication.  She does confirm that she uses Spiriva.  She has occasional chronic nausea for which she takes a Zofran tablet as needed, but she does not use the Zofran frequently at all.  She denies any indigestion, heartburn, or reflux symptoms.  She denies any symptoms of depression on her current dose of paroxetine 20 mg daily, and denies any suicidal ideation.   Review of Systems  Constitutional: Negative for fever, chills and diaphoresis.  Respiratory: Positive for cough (Occasional) and shortness of breath (Stable chronic exertional dyspnea). Negative for wheezing.   Cardiovascular: Negative for chest pain, palpitations and leg swelling.  Gastrointestinal: Positive for nausea (Occasional, chronic). Negative for vomiting, abdominal pain, blood in stool and anal bleeding.   Genitourinary: Negative for dysuria, vaginal bleeding and vaginal discharge.  Musculoskeletal: Positive for back pain (Chronic) and arthralgias (Knees).  Neurological: Negative for dizziness, syncope, weakness and numbness.  Psychiatric/Behavioral: Negative for suicidal ideas.    I reviewed and updated the medication list, allergies, past medical history, past surgical history, family history, and social history.     Objective:   Physical Exam  Constitutional: No distress.  Cardiovascular: Normal rate, regular rhythm and normal heart sounds.  Exam reveals no gallop and no friction rub.   No murmur heard. No edema  Pulmonary/Chest: Effort normal and breath sounds normal. No respiratory distress. She has no wheezes. She has no rales.  Abdominal: Soft. Bowel sounds are normal. She exhibits no distension. There is no hepatosplenomegaly. There is no tenderness. There is no rebound and no guarding.        Assessment & Plan:

## 2014-02-15 ENCOUNTER — Telehealth: Payer: Self-pay | Admitting: Licensed Clinical Social Worker

## 2014-02-15 DIAGNOSIS — I5032 Chronic diastolic (congestive) heart failure: Secondary | ICD-10-CM

## 2014-02-15 DIAGNOSIS — E139 Other specified diabetes mellitus without complications: Secondary | ICD-10-CM

## 2014-02-15 DIAGNOSIS — J449 Chronic obstructive pulmonary disease, unspecified: Secondary | ICD-10-CM

## 2014-02-15 NOTE — Telephone Encounter (Signed)
Caitlyn Aguirre was referred to [place], as pt has sleep apnea and could not afford a CPAP under her insurance 2 years ago.  CSW placed call to Caitlyn Aguirre, pt states she still would be unable to pay a $75 monthly copayment for a CPAP, as this is what she was quoted a couple of years ago.  CSW discussed referral to [facility], as an agency to assist with obtaining CPAP, assisting with chronic disease management and access to 24 hr nurse line.  Pt in agreement with referral to [facility], asking if her medication would need to be changed.  CSW assured Caitlyn Aguirre [facility] would not change her medication but offer assistance/education on the medications.  Referral to [facility] complete.

## 2014-02-16 ENCOUNTER — Encounter: Payer: Self-pay | Admitting: *Deleted

## 2014-02-20 ENCOUNTER — Encounter: Payer: Self-pay | Admitting: Internal Medicine

## 2014-02-20 DIAGNOSIS — R748 Abnormal levels of other serum enzymes: Secondary | ICD-10-CM | POA: Insufficient documentation

## 2014-02-20 NOTE — Progress Notes (Signed)
Quick Note:  Alkaline phosphatase level is very mildly elevated; plan is recheck upon return. ______

## 2014-03-14 ENCOUNTER — Other Ambulatory Visit: Payer: Self-pay | Admitting: Internal Medicine

## 2014-03-19 ENCOUNTER — Ambulatory Visit: Payer: Medicare HMO

## 2014-03-23 ENCOUNTER — Encounter (HOSPITAL_COMMUNITY): Payer: Self-pay | Admitting: *Deleted

## 2014-03-23 ENCOUNTER — Other Ambulatory Visit: Payer: Self-pay | Admitting: Internal Medicine

## 2014-03-23 ENCOUNTER — Emergency Department (INDEPENDENT_AMBULATORY_CARE_PROVIDER_SITE_OTHER): Payer: Medicare HMO

## 2014-03-23 ENCOUNTER — Inpatient Hospital Stay (HOSPITAL_COMMUNITY)
Admission: EM | Admit: 2014-03-23 | Discharge: 2014-03-25 | DRG: 871 | Disposition: A | Discharge status: Home / Self Care | Payer: Medicare HMO | Attending: Internal Medicine | Admitting: Internal Medicine

## 2014-03-23 ENCOUNTER — Emergency Department (INDEPENDENT_AMBULATORY_CARE_PROVIDER_SITE_OTHER)
Admission: EM | Admit: 2014-03-23 | Discharge: 2014-03-23 | Disposition: A | Discharge status: Nursing Home (Includes ICF) | Payer: Medicare HMO | Source: Home / Self Care

## 2014-03-23 ENCOUNTER — Encounter (HOSPITAL_COMMUNITY): Payer: Self-pay

## 2014-03-23 ENCOUNTER — Ambulatory Visit: Payer: Medicare HMO

## 2014-03-23 DIAGNOSIS — Z87891 Personal history of nicotine dependence: Secondary | ICD-10-CM | POA: Diagnosis not present

## 2014-03-23 DIAGNOSIS — R0602 Shortness of breath: Secondary | ICD-10-CM | POA: Diagnosis present

## 2014-03-23 DIAGNOSIS — R05 Cough: Secondary | ICD-10-CM

## 2014-03-23 DIAGNOSIS — Y95 Nosocomial condition: Secondary | ICD-10-CM | POA: Diagnosis present

## 2014-03-23 DIAGNOSIS — F32A Depression, unspecified: Secondary | ICD-10-CM | POA: Diagnosis present

## 2014-03-23 DIAGNOSIS — N289 Disorder of kidney and ureter, unspecified: Secondary | ICD-10-CM

## 2014-03-23 DIAGNOSIS — J441 Chronic obstructive pulmonary disease with (acute) exacerbation: Secondary | ICD-10-CM | POA: Diagnosis present

## 2014-03-23 DIAGNOSIS — M1990 Unspecified osteoarthritis, unspecified site: Secondary | ICD-10-CM | POA: Diagnosis present

## 2014-03-23 DIAGNOSIS — E785 Hyperlipidemia, unspecified: Secondary | ICD-10-CM | POA: Diagnosis present

## 2014-03-23 DIAGNOSIS — F329 Major depressive disorder, single episode, unspecified: Secondary | ICD-10-CM | POA: Diagnosis present

## 2014-03-23 DIAGNOSIS — Z8611 Personal history of tuberculosis: Secondary | ICD-10-CM | POA: Diagnosis not present

## 2014-03-23 DIAGNOSIS — I429 Cardiomyopathy, unspecified: Secondary | ICD-10-CM | POA: Diagnosis present

## 2014-03-23 DIAGNOSIS — E119 Type 2 diabetes mellitus without complications: Secondary | ICD-10-CM | POA: Diagnosis present

## 2014-03-23 DIAGNOSIS — I10 Essential (primary) hypertension: Secondary | ICD-10-CM | POA: Diagnosis present

## 2014-03-23 DIAGNOSIS — N179 Acute kidney failure, unspecified: Secondary | ICD-10-CM | POA: Diagnosis present

## 2014-03-23 DIAGNOSIS — I252 Old myocardial infarction: Secondary | ICD-10-CM | POA: Diagnosis not present

## 2014-03-23 DIAGNOSIS — E039 Hypothyroidism, unspecified: Secondary | ICD-10-CM | POA: Diagnosis present

## 2014-03-23 DIAGNOSIS — I209 Angina pectoris, unspecified: Secondary | ICD-10-CM | POA: Diagnosis present

## 2014-03-23 DIAGNOSIS — J189 Pneumonia, unspecified organism: Secondary | ICD-10-CM | POA: Diagnosis present

## 2014-03-23 DIAGNOSIS — M353 Polymyalgia rheumatica: Secondary | ICD-10-CM | POA: Diagnosis present

## 2014-03-23 DIAGNOSIS — I959 Hypotension, unspecified: Secondary | ICD-10-CM

## 2014-03-23 DIAGNOSIS — J479 Bronchiectasis, uncomplicated: Secondary | ICD-10-CM | POA: Diagnosis present

## 2014-03-23 DIAGNOSIS — I5032 Chronic diastolic (congestive) heart failure: Secondary | ICD-10-CM | POA: Diagnosis present

## 2014-03-23 DIAGNOSIS — A419 Sepsis, unspecified organism: Secondary | ICD-10-CM | POA: Insufficient documentation

## 2014-03-23 DIAGNOSIS — K648 Other hemorrhoids: Secondary | ICD-10-CM | POA: Diagnosis present

## 2014-03-23 DIAGNOSIS — R059 Cough, unspecified: Secondary | ICD-10-CM

## 2014-03-23 DIAGNOSIS — K219 Gastro-esophageal reflux disease without esophagitis: Secondary | ICD-10-CM | POA: Diagnosis present

## 2014-03-23 DIAGNOSIS — E1169 Type 2 diabetes mellitus with other specified complication: Secondary | ICD-10-CM | POA: Diagnosis present

## 2014-03-23 DIAGNOSIS — Z8673 Personal history of transient ischemic attack (TIA), and cerebral infarction without residual deficits: Secondary | ICD-10-CM

## 2014-03-23 DIAGNOSIS — E139 Other specified diabetes mellitus without complications: Secondary | ICD-10-CM

## 2014-03-23 DIAGNOSIS — K5900 Constipation, unspecified: Secondary | ICD-10-CM | POA: Diagnosis present

## 2014-03-23 DIAGNOSIS — I420 Dilated cardiomyopathy: Secondary | ICD-10-CM | POA: Diagnosis present

## 2014-03-23 DIAGNOSIS — G8929 Other chronic pain: Secondary | ICD-10-CM | POA: Diagnosis present

## 2014-03-23 DIAGNOSIS — M545 Low back pain: Secondary | ICD-10-CM | POA: Diagnosis present

## 2014-03-23 DIAGNOSIS — N390 Urinary tract infection, site not specified: Secondary | ICD-10-CM | POA: Diagnosis present

## 2014-03-23 LAB — CBC WITH DIFFERENTIAL/PLATELET
Basophils Absolute: 0 10*3/uL (ref 0.0–0.1)
Basophils Relative: 0 % (ref 0–1)
Eosinophils Absolute: 0.1 10*3/uL (ref 0.0–0.7)
Eosinophils Relative: 2 % (ref 0–5)
HCT: 37.7 % (ref 36.0–46.0)
Hemoglobin: 12.6 g/dL (ref 12.0–15.0)
Lymphocytes Relative: 7 % — ABNORMAL LOW (ref 12–46)
Lymphs Abs: 0.6 10*3/uL — ABNORMAL LOW (ref 0.7–4.0)
MCH: 28.8 pg (ref 26.0–34.0)
MCHC: 33.4 g/dL (ref 30.0–36.0)
MCV: 86.3 fL (ref 78.0–100.0)
Monocytes Absolute: 0.8 10*3/uL (ref 0.1–1.0)
Monocytes Relative: 9 % (ref 3–12)
Neutro Abs: 6.8 10*3/uL (ref 1.7–7.7)
Neutrophils Relative %: 82 % — ABNORMAL HIGH (ref 43–77)
Platelets: 184 10*3/uL (ref 150–400)
RBC: 4.37 MIL/uL (ref 3.87–5.11)
RDW: 15 % (ref 11.5–15.5)
WBC: 8.3 10*3/uL (ref 4.0–10.5)

## 2014-03-23 LAB — COMPREHENSIVE METABOLIC PANEL
ALT: 15 U/L (ref 0–35)
AST: 21 U/L (ref 0–37)
Albumin: 3.7 g/dL (ref 3.5–5.2)
Alkaline Phosphatase: 105 U/L (ref 39–117)
Anion gap: 9 (ref 5–15)
BUN: 14 mg/dL (ref 6–23)
CO2: 25 mmol/L (ref 19–32)
Calcium: 9.1 mg/dL (ref 8.4–10.5)
Chloride: 102 mmol/L (ref 96–112)
Creatinine, Ser: 1.53 mg/dL — ABNORMAL HIGH (ref 0.50–1.10)
GFR calc Af Amer: 37 mL/min — ABNORMAL LOW (ref >90)
GFR calc non Af Amer: 32 mL/min — ABNORMAL LOW (ref >90)
Glucose, Bld: 157 mg/dL — ABNORMAL HIGH (ref 70–99)
Potassium: 3.5 mmol/L (ref 3.5–5.1)
Sodium: 136 mmol/L (ref 135–145)
Total Bilirubin: 0.9 mg/dL (ref 0.3–1.2)
Total Protein: 6.9 g/dL (ref 6.0–8.3)

## 2014-03-23 LAB — I-STAT TROPONIN, ED: Troponin i, poc: 0.01 ng/mL (ref 0.00–0.08)

## 2014-03-23 LAB — TROPONIN I: Troponin I: <0.03 ng/mL (ref <0.031)

## 2014-03-23 LAB — I-STAT CG4 LACTIC ACID, ED: Lactic Acid, Venous: 1.74 mmol/L (ref 0.5–2.0)

## 2014-03-23 LAB — BRAIN NATRIURETIC PEPTIDE: B Natriuretic Peptide: 20.3 pg/mL (ref 0.0–100.0)

## 2014-03-23 LAB — GLUCOSE, CAPILLARY: Glucose-Capillary: 307 mg/dL — ABNORMAL HIGH (ref 70–99)

## 2014-03-23 MED ORDER — VANCOMYCIN HCL 10 G IV SOLR
2000.0000 mg | Freq: Once | INTRAVENOUS | Status: AC
  Administered 2014-03-23: 2000 mg via INTRAVENOUS
  Filled 2014-03-23: qty 2000

## 2014-03-23 MED ORDER — SODIUM CHLORIDE 0.9 % IJ SOLN
3.0000 mL | Freq: Two times a day (BID) | INTRAMUSCULAR | Status: DC
  Administered 2014-03-23: 3 mL via INTRAVENOUS

## 2014-03-23 MED ORDER — SODIUM CHLORIDE 0.9 % IV SOLN
INTRAVENOUS | Status: DC
  Administered 2014-03-23: 21:00:00 via INTRAVENOUS

## 2014-03-23 MED ORDER — ENOXAPARIN SODIUM 30 MG/0.3ML ~~LOC~~ SOLN
30.0000 mg | SUBCUTANEOUS | Status: DC
  Administered 2014-03-23: 30 mg via SUBCUTANEOUS
  Filled 2014-03-23 (×2): qty 0.3

## 2014-03-23 MED ORDER — SODIUM CHLORIDE 0.9 % IJ SOLN: 3.0000 mL | INTRAMUSCULAR | Status: DC | PRN

## 2014-03-23 MED ORDER — TIOTROPIUM BROMIDE MONOHYDRATE 18 MCG IN CAPS
18.0000 ug | ORAL_CAPSULE | Freq: Every day | RESPIRATORY_TRACT | Status: DC
  Administered 2014-03-24 – 2014-03-25 (×2): 18 ug via RESPIRATORY_TRACT
  Filled 2014-03-23: qty 5

## 2014-03-23 MED ORDER — ZOLPIDEM TARTRATE 5 MG PO TABS
5.0000 mg | ORAL_TABLET | Freq: Every evening | ORAL | Status: DC | PRN
  Administered 2014-03-23 – 2014-03-24 (×2): 5 mg via ORAL
  Filled 2014-03-23 (×2): qty 1

## 2014-03-23 MED ORDER — ATORVASTATIN CALCIUM 10 MG PO TABS
20.0000 mg | ORAL_TABLET | Freq: Every day | ORAL | Status: DC
  Administered 2014-03-24: 20 mg via ORAL
  Filled 2014-03-23: qty 1

## 2014-03-23 MED ORDER — PANTOPRAZOLE SODIUM 40 MG PO TBEC
40.0000 mg | DELAYED_RELEASE_TABLET | Freq: Every day | ORAL | Status: DC
  Administered 2014-03-24 – 2014-03-25 (×2): 40 mg via ORAL
  Filled 2014-03-23 (×2): qty 1

## 2014-03-23 MED ORDER — HYDROCODONE-ACETAMINOPHEN 5-325 MG PO TABS
1.0000 | ORAL_TABLET | Freq: Four times a day (QID) | ORAL | Status: AC | PRN
  Administered 2014-03-23 – 2014-03-24 (×2): 1 via ORAL
  Filled 2014-03-23 (×2): qty 1

## 2014-03-23 MED ORDER — SODIUM CHLORIDE 0.9 % IJ SOLN: 3.0000 mL | Freq: Two times a day (BID) | INTRAMUSCULAR | Status: DC

## 2014-03-23 MED ORDER — SENNOSIDES-DOCUSATE SODIUM 8.6-50 MG PO TABS
1.0000 | ORAL_TABLET | Freq: Every day | ORAL | Status: DC
  Administered 2014-03-24 – 2014-03-25 (×2): 1 via ORAL
  Filled 2014-03-23 (×3): qty 1

## 2014-03-23 MED ORDER — PAROXETINE HCL 20 MG PO TABS
20.0000 mg | ORAL_TABLET | Freq: Every morning | ORAL | Status: DC
  Administered 2014-03-24 – 2014-03-25 (×2): 20 mg via ORAL
  Filled 2014-03-23 (×3): qty 1

## 2014-03-23 MED ORDER — ACETAMINOPHEN 325 MG PO TABS: 650.0000 mg | ORAL_TABLET | Freq: Four times a day (QID) | ORAL | Status: DC | PRN

## 2014-03-23 MED ORDER — LEVOTHYROXINE SODIUM 100 MCG PO TABS
100.0000 ug | ORAL_TABLET | Freq: Every day | ORAL | Status: DC
  Administered 2014-03-24 – 2014-03-25 (×2): 100 ug via ORAL
  Filled 2014-03-23 (×3): qty 1

## 2014-03-23 MED ORDER — ASPIRIN EC 81 MG PO TBEC
81.0000 mg | DELAYED_RELEASE_TABLET | Freq: Every day | ORAL | Status: DC
  Administered 2014-03-24 – 2014-03-25 (×2): 81 mg via ORAL
  Filled 2014-03-23 (×2): qty 1

## 2014-03-23 MED ORDER — LISINOPRIL 5 MG PO TABS
5.0000 mg | ORAL_TABLET | Freq: Every day | ORAL | Status: DC
  Filled 2014-03-23: qty 1

## 2014-03-23 MED ORDER — CARVEDILOL 12.5 MG PO TABS
12.5000 mg | ORAL_TABLET | Freq: Two times a day (BID) | ORAL | Status: DC
  Administered 2014-03-24: 12.5 mg via ORAL
  Filled 2014-03-23 (×3): qty 1

## 2014-03-23 MED ORDER — LEVOFLOXACIN IN D5W 750 MG/150ML IV SOLN
750.0000 mg | Freq: Once | INTRAVENOUS | Status: AC
  Administered 2014-03-23: 750 mg via INTRAVENOUS
  Filled 2014-03-23: qty 150

## 2014-03-23 MED ORDER — GLIPIZIDE ER 5 MG PO TB24
5.0000 mg | ORAL_TABLET | Freq: Every day | ORAL | Status: DC
  Filled 2014-03-23: qty 1

## 2014-03-23 MED ORDER — ONDANSETRON HCL 4 MG PO TABS: 4.0000 mg | ORAL_TABLET | Freq: Every day | ORAL | Status: DC | PRN

## 2014-03-23 MED ORDER — ACETAMINOPHEN 500 MG PO TABS
1000.0000 mg | ORAL_TABLET | Freq: Once | ORAL | Status: AC
  Administered 2014-03-23: 1000 mg via ORAL
  Filled 2014-03-23: qty 2

## 2014-03-23 MED ORDER — INSULIN ASPART 100 UNIT/ML ~~LOC~~ SOLN
0.0000 [IU] | Freq: Every day | SUBCUTANEOUS | Status: DC
  Administered 2014-03-23: 4 [IU] via SUBCUTANEOUS

## 2014-03-23 MED ORDER — ALBUTEROL SULFATE (2.5 MG/3ML) 0.083% IN NEBU: 2.5000 mg | INHALATION_SOLUTION | Freq: Four times a day (QID) | RESPIRATORY_TRACT | Status: DC | PRN

## 2014-03-23 MED ORDER — VANCOMYCIN HCL 10 G IV SOLR
1250.0000 mg | INTRAVENOUS | Status: DC
  Filled 2014-03-23: qty 1250

## 2014-03-23 MED ORDER — ACETAMINOPHEN 650 MG RE SUPP: 650.0000 mg | Freq: Four times a day (QID) | RECTAL | Status: DC | PRN

## 2014-03-23 MED ORDER — AMLODIPINE BESYLATE 5 MG PO TABS
5.0000 mg | ORAL_TABLET | Freq: Every day | ORAL | Status: DC
  Filled 2014-03-23: qty 1

## 2014-03-23 MED ORDER — SODIUM CHLORIDE 0.9 % IV SOLN: 250.0000 mL | INTRAVENOUS | Status: DC | PRN

## 2014-03-23 MED ORDER — INSULIN ASPART 100 UNIT/ML ~~LOC~~ SOLN: 0.0000 [IU] | Freq: Three times a day (TID) | SUBCUTANEOUS | Status: DC

## 2014-03-23 MED ORDER — LEVOFLOXACIN IN D5W 750 MG/150ML IV SOLN
750.0000 mg | INTRAVENOUS | Status: DC
  Filled 2014-03-23: qty 150

## 2014-03-23 NOTE — Progress Notes (Signed)
ANTIBIOTIC CONSULT NOTE - FOLLOW UP  Pharmacy Consult for levaquin Indication: bronchitis  No Known Allergies  Patient Measurements: Height: 5\' 8"  (172.7 cm) Weight: 218 lb 4.8 oz (99.02 kg) IBW/kg (Calculated) : 63.9   Vital Signs: Temp: 100.3 F (37.9 C) (02/19 1825) Temp Source: Rectal (02/19 1825) BP: 104/56 mmHg (02/19 1945) Pulse Rate: 78 (02/19 2030) Intake/Output from previous day:   Intake/Output from this shift:    Labs:  Recent Labs  03/23/14 1840  WBC 8.3  HGB 12.6  PLT 184  CREATININE 1.53*   Estimated Creatinine Clearance: 38.5 mL/min (by C-G formula based on Cr of 1.53). No results for input(s): VANCOTROUGH, VANCOPEAK, VANCORANDOM, GENTTROUGH, GENTPEAK, GENTRANDOM, TOBRATROUGH, TOBRAPEAK, TOBRARND, AMIKACINPEAK, AMIKACINTROU, AMIKACIN in the last 72 hours.   Microbiology: No results found for this or any previous visit (from the past 720 hour(s)).  Anti-infectives    Start     Dose/Rate Route Frequency Ordered Stop   03/24/14 2100  vancomycin (VANCOCIN) 1,250 mg in sodium chloride 0.9 % 250 mL IVPB     1,250 mg 166.7 mL/hr over 90 Minutes Intravenous Every 24 hours 03/23/14 2044     03/23/14 2100  vancomycin (VANCOCIN) 2,000 mg in sodium chloride 0.9 % 500 mL IVPB     2,000 mg 250 mL/hr over 120 Minutes Intravenous  Once 03/23/14 2036     03/23/14 1945  levofloxacin (LEVAQUIN) IVPB 750 mg     750 mg 100 mL/hr over 90 Minutes Intravenous  Once 03/23/14 1935        Assessment: 79 yo female with c/o of cough, yellow sputum, and dyspnea on vancomycin for sepsis and to add levaquin for bronchitis/PNA.  WBC= 8.3, tmax= 100.3, SCr= 1.53 and CrCl ~ 40.  Levaquin 750mg  IV was given at 8pm today.  2/19 vanc 2/19 levaquin  Goal of Therapy:  Vancomycin trough level 15-20 mcg/ml  Plan:   -Continue levaquin as 750mg  IV q48hr -Will follow renal function and clinical progress  Hildred Laser, Pharm D 03/23/2014 9:13 PM
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
## 2014-03-23 NOTE — ED Notes (Signed)
[facility] HERE  PT    TO  BE  TRANSFERRED  TO  ER
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
## 2014-03-23 NOTE — ED Notes (Addendum)
Pt  Reports  Cough   For  About  10   Days    With  Body  Aches           With      Chills      That  Started          Last  Pm        Cap  Refill   Delayed        Skin  Is  Warm   And  Dry

## 2014-03-23 NOTE — ED Notes (Signed)
Pt. Is from home. Pt. Went to [facility] today for complaint of SOB and cough x 10 days, worsening over time. Pt. On 2L here. UCC tx due to concern over PNA and hypotension. Pt. BP 83/48. EMS noted ronchi and rales throughout bases of lungs.
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
## 2014-03-23 NOTE — ED Notes (Signed)
Pt  Placed  On  Cardiac  Monitor  And nasal  o2  At  2  l  /  Min

## 2014-03-24 DIAGNOSIS — A419 Sepsis, unspecified organism: Principal | ICD-10-CM

## 2014-03-24 DIAGNOSIS — E039 Hypothyroidism, unspecified: Secondary | ICD-10-CM

## 2014-03-24 DIAGNOSIS — N179 Acute kidney failure, unspecified: Secondary | ICD-10-CM

## 2014-03-24 DIAGNOSIS — F329 Major depressive disorder, single episode, unspecified: Secondary | ICD-10-CM

## 2014-03-24 DIAGNOSIS — I10 Essential (primary) hypertension: Secondary | ICD-10-CM

## 2014-03-24 DIAGNOSIS — E119 Type 2 diabetes mellitus without complications: Secondary | ICD-10-CM

## 2014-03-24 DIAGNOSIS — N390 Urinary tract infection, site not specified: Secondary | ICD-10-CM

## 2014-03-24 DIAGNOSIS — K219 Gastro-esophageal reflux disease without esophagitis: Secondary | ICD-10-CM

## 2014-03-24 DIAGNOSIS — J449 Chronic obstructive pulmonary disease, unspecified: Secondary | ICD-10-CM

## 2014-03-24 DIAGNOSIS — K5900 Constipation, unspecified: Secondary | ICD-10-CM

## 2014-03-24 LAB — URINALYSIS, ROUTINE W REFLEX MICROSCOPIC
Bilirubin Urine: NEGATIVE
Glucose, UA: NEGATIVE mg/dL
Hgb urine dipstick: NEGATIVE
Ketones, ur: NEGATIVE mg/dL
Nitrite: POSITIVE — AB
Protein, ur: 30 mg/dL — AB
Specific Gravity, Urine: 1.018 (ref 1.005–1.030)
Urobilinogen, UA: 0.2 mg/dL (ref 0.0–1.0)
pH: 5 (ref 5.0–8.0)

## 2014-03-24 LAB — GLUCOSE, CAPILLARY
Glucose-Capillary: 70 mg/dL (ref 70–99)
Glucose-Capillary: 119 mg/dL — ABNORMAL HIGH (ref 70–99)
Glucose-Capillary: 292 mg/dL — ABNORMAL HIGH (ref 70–99)
Glucose-Capillary: 316 mg/dL — ABNORMAL HIGH (ref 70–99)

## 2014-03-24 LAB — MRSA PCR SCREENING: MRSA by PCR: NEGATIVE

## 2014-03-24 LAB — COMPREHENSIVE METABOLIC PANEL
ALT: 20 U/L (ref 0–35)
AST: 28 U/L (ref 0–37)
Albumin: 3.2 g/dL — ABNORMAL LOW (ref 3.5–5.2)
Alkaline Phosphatase: 92 U/L (ref 39–117)
Anion gap: 8 (ref 5–15)
BUN: 20 mg/dL (ref 6–23)
CO2: 20 mmol/L (ref 19–32)
Calcium: 8.5 mg/dL (ref 8.4–10.5)
Chloride: 111 mmol/L (ref 96–112)
Creatinine, Ser: 1.4 mg/dL — ABNORMAL HIGH (ref 0.50–1.10)
GFR calc Af Amer: 41 mL/min — ABNORMAL LOW (ref >90)
GFR calc non Af Amer: 35 mL/min — ABNORMAL LOW (ref >90)
Glucose, Bld: 52 mg/dL — ABNORMAL LOW (ref 70–99)
Potassium: 4.1 mmol/L (ref 3.5–5.1)
Sodium: 139 mmol/L (ref 135–145)
Total Bilirubin: 1 mg/dL (ref 0.3–1.2)
Total Protein: 5.6 g/dL — ABNORMAL LOW (ref 6.0–8.3)

## 2014-03-24 LAB — CBC WITH DIFFERENTIAL/PLATELET
Basophils Absolute: 0 10*3/uL (ref 0.0–0.1)
Basophils Relative: 0 % (ref 0–1)
Eosinophils Absolute: 0.1 10*3/uL (ref 0.0–0.7)
Eosinophils Relative: 2 % (ref 0–5)
HCT: 33.1 % — ABNORMAL LOW (ref 36.0–46.0)
Hemoglobin: 11.3 g/dL — ABNORMAL LOW (ref 12.0–15.0)
Lymphocytes Relative: 14 % (ref 12–46)
Lymphs Abs: 0.8 10*3/uL (ref 0.7–4.0)
MCH: 28.8 pg (ref 26.0–34.0)
MCHC: 34.1 g/dL (ref 30.0–36.0)
MCV: 84.2 fL (ref 78.0–100.0)
Monocytes Absolute: 0.6 10*3/uL (ref 0.1–1.0)
Monocytes Relative: 10 % (ref 3–12)
Neutro Abs: 4.3 10*3/uL (ref 1.7–7.7)
Neutrophils Relative %: 74 % (ref 43–77)
Platelets: 103 10*3/uL — ABNORMAL LOW (ref 150–400)
RBC: 3.93 MIL/uL (ref 3.87–5.11)
RDW: 15 % (ref 11.5–15.5)
WBC: 5.7 10*3/uL (ref 4.0–10.5)

## 2014-03-24 LAB — PROTIME-INR
INR: 1.09 (ref 0.00–1.49)
Prothrombin Time: 14.2 seconds (ref 11.6–15.2)

## 2014-03-24 LAB — SODIUM, URINE, RANDOM: Sodium, Ur: 77 mmol/L

## 2014-03-24 LAB — INFLUENZA PANEL BY PCR (TYPE A & B)
H1N1 flu by pcr: NOT DETECTED
Influenza A By PCR: NEGATIVE
Influenza B By PCR: NEGATIVE

## 2014-03-24 LAB — PROCALCITONIN: Procalcitonin: 0.32 ng/mL

## 2014-03-24 LAB — URINE MICROSCOPIC-ADD ON

## 2014-03-24 LAB — TROPONIN I
Troponin I: <0.03 ng/mL (ref <0.031)
Troponin I: <0.03 ng/mL (ref <0.031)

## 2014-03-24 LAB — CREATININE, URINE, RANDOM: Creatinine, Urine: 96.95 mg/dL

## 2014-03-24 LAB — MAGNESIUM: Magnesium: 1.3 mg/dL — ABNORMAL LOW (ref 1.5–2.5)

## 2014-03-24 LAB — PHOSPHORUS: Phosphorus: 2.5 mg/dL (ref 2.3–4.6)

## 2014-03-24 LAB — CORTISOL: Cortisol, Plasma: 6.4 ug/dL

## 2014-03-24 MED ORDER — FLUTICASONE PROPIONATE 50 MCG/ACT NA SUSP
2.0000 | Freq: Every day | NASAL | Status: DC
  Administered 2014-03-24 – 2014-03-25 (×2): 2 via NASAL
  Filled 2014-03-24 (×3): qty 16

## 2014-03-24 MED ORDER — ALBUTEROL SULFATE (2.5 MG/3ML) 0.083% IN NEBU: 2.5000 mg | INHALATION_SOLUTION | RESPIRATORY_TRACT | Status: DC | PRN

## 2014-03-24 MED ORDER — METHYLPREDNISOLONE SODIUM SUCC 125 MG IJ SOLR
125.0000 mg | Freq: Once | INTRAMUSCULAR | Status: AC
  Administered 2014-03-24: 125 mg via INTRAVENOUS
  Filled 2014-03-24: qty 2

## 2014-03-24 MED ORDER — SODIUM CHLORIDE 0.9 % IV SOLN
INTRAVENOUS | Status: AC
  Administered 2014-03-24 (×2): via INTRAVENOUS

## 2014-03-24 MED ORDER — BENZONATATE 100 MG PO CAPS
100.0000 mg | ORAL_CAPSULE | Freq: Two times a day (BID) | ORAL | Status: DC | PRN
  Filled 2014-03-24: qty 1

## 2014-03-24 MED ORDER — HEPARIN SODIUM (PORCINE) 5000 UNIT/ML IJ SOLN
5000.0000 [IU] | Freq: Three times a day (TID) | INTRAMUSCULAR | Status: DC
  Administered 2014-03-24 – 2014-03-25 (×5): 5000 [IU] via SUBCUTANEOUS
  Filled 2014-03-24 (×5): qty 1

## 2014-03-24 MED ORDER — IPRATROPIUM-ALBUTEROL 0.5-2.5 (3) MG/3ML IN SOLN
3.0000 mL | Freq: Three times a day (TID) | RESPIRATORY_TRACT | Status: DC
  Administered 2014-03-25 (×2): 3 mL via RESPIRATORY_TRACT
  Filled 2014-03-24 (×2): qty 3

## 2014-03-24 MED ORDER — MOMETASONE FURO-FORMOTEROL FUM 100-5 MCG/ACT IN AERO
2.0000 | INHALATION_SPRAY | Freq: Two times a day (BID) | RESPIRATORY_TRACT | Status: DC
  Administered 2014-03-24 – 2014-03-25 (×2): 2 via RESPIRATORY_TRACT
  Filled 2014-03-24: qty 8.8

## 2014-03-24 MED ORDER — IPRATROPIUM-ALBUTEROL 0.5-2.5 (3) MG/3ML IN SOLN
3.0000 mL | RESPIRATORY_TRACT | Status: DC
  Administered 2014-03-24 (×3): 3 mL via RESPIRATORY_TRACT
  Filled 2014-03-24 (×3): qty 3

## 2014-03-24 MED ORDER — MAGNESIUM SULFATE 2 GM/50ML IV SOLN
2.0000 g | Freq: Once | INTRAVENOUS | Status: AC
  Administered 2014-03-24: 2 g via INTRAVENOUS
  Filled 2014-03-24: qty 50

## 2014-03-24 MED ORDER — INSULIN ASPART 100 UNIT/ML ~~LOC~~ SOLN
0.0000 [IU] | Freq: Three times a day (TID) | SUBCUTANEOUS | Status: DC
  Administered 2014-03-24: 8 [IU] via SUBCUTANEOUS
  Administered 2014-03-25: 5 [IU] via SUBCUTANEOUS
  Administered 2014-03-25: 3 [IU] via SUBCUTANEOUS

## 2014-03-24 MED ORDER — PREDNISONE 20 MG PO TABS
60.0000 mg | ORAL_TABLET | Freq: Every day | ORAL | Status: DC
  Administered 2014-03-25: 60 mg via ORAL
  Filled 2014-03-24: qty 1
  Filled 2014-03-24: qty 3

## 2014-03-24 MED ORDER — DM-GUAIFENESIN ER 30-600 MG PO TB12
1.0000 | ORAL_TABLET | Freq: Two times a day (BID) | ORAL | Status: DC
  Administered 2014-03-24 – 2014-03-25 (×3): 1 via ORAL
  Filled 2014-03-24 (×5): qty 1

## 2014-03-24 NOTE — Evaluation (Signed)
Physical Therapy Evaluation Patient Details Name: Caitlyn Aguirre MRN: [id number] DOB: [birth year] Today's Date: 03/24/2014   History of Present Illness  79 yo female with Copd ? Restrictive lung disease , CHF (EF=55%, 08/22/2008), Hypothyroidism, Dm2, apparently c/o cough, yellow sputum, x1 day, along with dyspnea, fever, generalized achiness. Pt denies any sick contact, or any recent travel. Pt brought to ED by her family for evaluation. Pt was noted to be hypotensive and also to have bronchitis on CXR. And will be admitted for hcap, hypotension, septis due to UTI and COPD exacerbation.  Clinical Impression  Pt admitted with above diagnosis. Pt currently with functional limitations due to the deficits listed below (see PT Problem List).  Pt will benefit from skilled PT to increase their independence and safety with mobility to allow discharge to the venue listed below.       Follow Up Recommendations No PT follow up;Supervision for mobility/OOB    Equipment Recommendations  None recommended by PT    Recommendations for Other Services       Precautions / Restrictions Precautions Precautions: Fall      Mobility  Bed Mobility Overal bed mobility: Modified Independent                Transfers Overall transfer level: Needs assistance Equipment used: None Transfers: Sit to/from [facility] Sit to Stand: Min guard Stand pivot transfers: Min guard          Ambulation/Gait Ambulation/Gait assistance: Min guard Ambulation Distance (Feet): 100 Feet Assistive device: None Gait Pattern/deviations: Step-through pattern;Decreased stride length Gait velocity: decreased   General Gait Details: Ambulated on RA. Pt returned to O2 via [place] upon return to room.  Stairs            Wheelchair Mobility    Modified Rankin (Stroke Patients Only)       Balance Overall balance assessment: Needs assistance Sitting-balance support: No upper extremity  supported;Feet supported Sitting balance-Leahy Scale: Good     Standing balance support: No upper extremity supported;During functional activity Standing balance-Leahy Scale: Fair                               Pertinent Vitals/Pain Pain Assessment: No/denies pain    Home Living Family/patient expects to be discharged to:: Private residence Living Arrangements: Children Available Help at Discharge: Family;Friend(s);Available 24 hours/day Type of Home: House Home Access: Stairs to enter Entrance Stairs-Rails: Right;Left;Can reach both Entrance Stairs-Number of Steps: 4 Home Layout: One level Home Equipment: Shower seat;Toilet riser      Prior Function Level of Independence: Independent               Hand Dominance        Extremity/Trunk Assessment   Upper Extremity Assessment: Generalized weakness           Lower Extremity Assessment: Generalized weakness      Cervical / Trunk Assessment: Normal  Communication   Communication: No difficulties  Cognition Arousal/Alertness: Awake/alert Behavior During Therapy: WFL for tasks assessed/performed Overall Cognitive Status: Within Functional Limits for tasks assessed                      General Comments      Exercises        Assessment/Plan    PT Assessment Patient needs continued PT services  PT Diagnosis Generalized weakness;Difficulty walking   PT Problem List Decreased strength;Decreased activity tolerance;Decreased balance;Decreased mobility;Cardiopulmonary  status limiting activity  PT Treatment Interventions Gait training;Stair training;Functional mobility training;Therapeutic activities;Therapeutic exercise;Patient/family education;Balance training   PT Goals (Current goals can be found in the Care Plan section) Acute Rehab PT Goals Patient Stated Goal: home PT Goal Formulation: With patient Time For Goal Achievement: 03/31/14 Potential to Achieve Goals: Good     Frequency Min 3X/week   Barriers to discharge        Co-evaluation               End of Session Equipment Utilized During Treatment: Gait belt Activity Tolerance: Patient tolerated treatment well Patient left: in chair;with call bell/phone within reach;with family/visitor present Nurse Communication: Mobility status         Time: 1550-1606 PT Time Calculation (min) (ACUTE ONLY): 16 min   Charges:   PT Evaluation $Initial PT Evaluation Tier I: 1 Procedure     PT G CodesLorriane Shire 03/24/2014, 4:19 PM

## 2014-03-24 NOTE — Progress Notes (Signed)
INTERNAL MEDICINE TEACHING ATTENDING NOTE  Day 1 of stay  Patient name: Caitlyn Aguirre  MRN: [id number] Date of birth: 02-Aug-1935   Key clinical points and exam                                                          79 y.o. african [place] female with past medical history of COPD, history of bronchiectasis, hypertension and CHF (EF 55%, Grd1 diastolif dys 08/2008 2D echo), hypothyroidism, MI in 1999, Stroke 2009, DM2 and PMR in remission, comes in with persistent cough, chest pain and weakness for the past 2 weeks which really got worse this Thursday. The patient endorses cough with yellow sputum, subjective fevers, chest pain while coughing in the past few days. She denies weakness, blurred vision, nausea, vomiting, diarrhea or constipation or urinary problems. Hospital course is significant for transient hypotension to 90s/40s overnight.    Blood pressure 106/47, pulse 78, temperature 99.7 F (37.6 C), temperature source Oral, resp. rate 18, height 5\' 8"  (1.727 m), weight 216 lb 0.8 oz (98 kg), SpO2 91 %. General: Resting in bed. No distress, no oxygen supplementation.  HEENT: PERRL, EOMI, no scleral icterus. Heart: RRR, no rubs, murmurs or gallops. Lungs: Mild crackles left base, air movement normal, no wheezes, rales, or rhonchi. Abdomen: Soft, nontender, nondistended, BS present. Extremities: Warm, no pedal edema. Good pulses.    Key clinical labs and imaging                                                           Creatinine Trend  03/23/14 1840 03/24/14 0322  CREATININE 1.53* 1.40*   Glucose Trend  03/23/14 2132 03/24/14 0322 03/24/14 0816  GLUCAP 307* 52 70   Noted WBC 8.3 with neutrophilia 82%, no bands or left shift.  Noted platelets drop from 184 yesterday to 103 today.  UA noted - dirty.  EKG reviewed - not significant for AMI. Trops negative.  CXR: Enlargement of cardiac silhouette. Tortuous aorta. Mediastinal contours and pulmonary vascularity otherwise normal.  Enlargement of RIGHT hilum appears unchanged since previous exam as well as an earlier study of 04/03/2011 question due to pulmonary arterial hypertension based on prior CT. Chronic atelectasis LEFT base. Minimal central peribronchial thickening. No infiltrate, pleural effusion or pneumothorax. Scattered endplate spur formation thoracic spine.  Brief Assessment and Plan                                                                       Questionable pneumonia versus bronchitis/COPD exacerbation with UTI. - Agree given the severe presentation to continue levofloxacin which would cover both lung and urine infection. Would continue breathing treatments and inhalers. Agree with stopping solumedrol and change to prednisone.   Diabetes complicated with hypoglycemia - Sensitive sliding scale given hypoglycemia.  AKI - creatinine trending down, would hydrate with caution.  Hypotension - resolved.   Hypothyroidism - Continue home dose of synthroid.  Platelets trending down on heparin - would monitor.  I have seen and evaluated this patient and discussed it with my IM resident team.  Please see the rest of the plan per resident note from today.   [place], [place] 03/24/2014, 10:56 AM.

## 2014-03-24 NOTE — Progress Notes (Signed)
Subjective:    Transfer Note: Caitlyn Aguirre was admitted to stepdown by Dr. Maudie Mercury of the hospitalist service after coming to the hospital with shortness of breath and increased productive cough. She was hypotensive on admission and noted to have bronchitis on chest x-ray. He began treatment for HCAP with Levaquin and vancomycin and gave her albuterol nebulizers every 6 hours as needed. She was given normal saline at 50 mL per hour overnight. She was noted to be a internal medicine teaching service patient, who follows with Dr. Marinda Elk in our clinic. She was transferred to our service this morning, and we will take over care.  Currently, she reports improvement of her shortness of breath and more energy this morning. She says she is hungry and would like to eat. She has some lower abdominal pain, but she denies any urinary symptoms.   Interval history:  -Urinalysis consistent with UTI. -Kidney function slightly improved this morning. -Blood pressure improved to 116/53 this morning. -BNP normal. Troponins negative overnight.    Objective:    Vital Signs:   Temp:  [98.9 F (37.2 C)-100.3 F (37.9 C)] 99.7 F (37.6 C) (02/20 0740) Pulse Rate:  [72-90] 78 (02/20 1050) Resp:  [15-30] 18 (02/20 1050) BP: (81-116)/(38-81) 106/47 mmHg (02/20 0800) SpO2:  [84 %-97 %] 91 % (02/20 1050) Weight:  [216 lb 0.8 oz (98 kg)-218 lb 4.8 oz (99.02 kg)] 216 lb 0.8 oz (98 kg) (02/19 2107) Last BM Date: 03/23/14  24-hour weight change: Weight change:   Intake/Output:   Intake/Output Summary (Last 24 hours) at 03/24/14 1102 Last data filed at 03/24/14 0930  Gross per 24 hour  Intake   1110 ml  Output    700 ml  Net    410 ml      Physical Exam: General: Well-developed, well-nourished, in no acute distress; alert, appropriate and cooperative throughout examination.  Lungs:  Bilateral wheezing and rhonchi, with no rales.  Heart: RRR. S1 and S2 normal without gallop, murmur, or rubs.  Abdomen:  Mild  suprapubic tenderness. Soft, Nondistended.  No masses or organomegaly.  Extremities: No pretibial edema.     Labs:  Basic Metabolic Panel:  Recent Labs Lab 03/23/14 1840 03/24/14 0322  NA 136 139  K 3.5 4.1  CL 102 111  CO2 25 20  GLUCOSE 157* 52*  BUN 14 20  CREATININE 1.53* 1.40*  CALCIUM 9.1 8.5    Liver Function Tests:  Recent Labs Lab 03/23/14 1840 03/24/14 0322  AST 21 28  ALT 15 20  ALKPHOS 105 92  BILITOT 0.9 1.0  PROT 6.9 5.6*  ALBUMIN 3.7 3.2*   CBC:  Recent Labs Lab 03/23/14 1840 03/24/14 0322  WBC 8.3 5.7  NEUTROABS 6.8 4.3  HGB 12.6 11.3*  HCT 37.7 33.1*  MCV 86.3 84.2  PLT 184 103*    Cardiac Enzymes:  Recent Labs Lab 03/23/14 2228 03/24/14 0322  TROPONINI <0.03 <0.03    BNP: 20.3  CBG:  Recent Labs Lab 03/23/14 2132 03/24/14 0816  GLUCAP 307* 36    Microbiology: Results for orders placed or performed during the hospital encounter of 03/23/14  MRSA PCR Screening     Status: None   Collection Time: 03/23/14  9:22 PM  Result Value Ref Range Status   MRSA by PCR NEGATIVE NEGATIVE Final    Comment:        The GeneXpert MRSA Assay (FDA approved for NASAL specimens only), is one component of a comprehensive MRSA colonization surveillance program.  It is not intended to diagnose MRSA infection nor to guide or monitor treatment for MRSA infections.     Coagulation Studies: No results for input(s): LABPROT, INR in the last 72 hours.   Other results: EKG: unchanged from previous tracings, normal sinus rhythm, nonspecific ST and T waves changes.  Imaging: Dg Chest 2 View  03/23/2014   CLINICAL DATA:  Cough for 10 days, productive cough, shortness of breath, hospitalized in January, bronchitis, type 2 diabetes, hypertension, COPD, GERD, polymyalgia rheumatica syndrome, bronchiectasis, idiopathic cardiomyopathy, dyslipidemia, stroke, CHF, MI  EXAM: CHEST  2 VIEW  COMPARISON:  02/14/2014 and 04/03/2011, prior CT chest  02/27/2004  FINDINGS: Enlargement of cardiac silhouette.  Tortuous aorta.  Mediastinal contours and pulmonary vascularity otherwise normal.  Enlargement of RIGHT hilum appears unchanged since previous exam as well as an earlier study of 04/03/2011 question due to pulmonary arterial hypertension based on prior CT.  Chronic atelectasis LEFT base.  Minimal central peribronchial thickening.  No infiltrate, pleural effusion or pneumothorax.  Scattered endplate spur formation thoracic spine.  IMPRESSION: Enlargement of cardiac silhouette.  Bronchitic changes with atelectasis versus scarring at LEFT base.  Stable prominence of RIGHT hilum since 2013 question due to pulmonary arterial hypertension.   Electronically Signed   By: Lavonia Dana M.D.   On: 03/23/2014 16:43       Medications:    Infusions: . sodium chloride 125 mL/hr at 03/24/14 0816    Scheduled Medications: . aspirin EC  81 mg Oral Daily  . atorvastatin  20 mg Oral q1800  . dextromethorphan-guaiFENesin  1 tablet Oral BID  . fluticasone  2 spray Each Nare Daily  . heparin subcutaneous  5,000 Units Subcutaneous 3 times per day  . insulin aspart  0-15 Units Subcutaneous TID WC  . ipratropium-albuterol  3 mL Nebulization Q4H  . [START ON 03/25/2014] levofloxacin (LEVAQUIN) IV  750 mg Intravenous Q48H  . levothyroxine  100 mcg Oral QAC breakfast  . mometasone-formoterol  2 puff Inhalation BID  . pantoprazole  40 mg Oral Daily  . PARoxetine  20 mg Oral q morning - 10a  . [START ON 03/25/2014] predniSONE  60 mg Oral Q breakfast  . senna-docusate  1 tablet Oral Daily  . sodium chloride  3 mL Intravenous Q12H  . tiotropium  18 mcg Inhalation Daily    PRN Medications: sodium chloride, acetaminophen **OR** acetaminophen, albuterol, benzonatate, ondansetron, sodium chloride, zolpidem   Assessment/ Plan:    Principal Problem:   Sepsis secondary to UTI Active Problems:   Hypothyroidism   Diabetes 1.5, managed as type 2   Hyperlipidemia    Essential hypertension   COPD exacerbation   Acute kidney failure  #Sepsis secondary to UTI No evidence of pneumonia on chest x-ray. Urinalysis is consistent with UTI in association with her suprapubic abdominal pain. This is the most likely cause of her sepsis with tachypnea and fever on presentation. Her blood pressure is much improved this morning, but she could benefit from additional hydration given her AKI and sepsis. No need for vancomycin. We'll continue the Levaquin for both her COPD exacerbation and UTI. I doubt adrenal insufficiency as she is responding to fluids. -Continue Levaquin IV, day 1 of 7.  -Stop vancomycin. -Normal saline at 125 mL per hour. -Urine and blood cultures. -Follow pro-calcitonin. -Check HIV antibody. -Follow-up cortisol level. -Tylenol 650 mg every 6 hours as needed for fever or pain. -Card modified diet.  #COPD exacerbation Increased sputum production and wheezing with no opacities on  chest x-ray. Most consistent with a COPD exacerbation. Previous poor function tests done in May 2013 showed evidence of restrictive lung disease with FEV1 86% of predicted. However, she is on Spiriva and albuterol at home.  -Check influenza panel. -Follow-up sputum culture and Gram stain. -Solu-Medrol 125 mg IV. Will start prednisone 60 mg daily tomorrow. -Levaquin as above. -DuoNeb's every 4 hours. -Albuterol nebulizers every 2 hours as needed. -Start Dulera 2 puffs twice a day. -Start Mucinex DM and Tessalon Perles. -Continue home Spiriva. -Flonase daily.  #Acute kidney injury Creatinine elevated to 1.4 down from 1.53 yesterday. Baseline creatinine 0.8. Fractional excretion of sodium 0.8%, consistent with prerenal. -Continue fluids as above.  -Hold home blood pressure medications.  #Grade 1 diastolic dysfunction Her last echo in 2010 showed grade 1 diastolic dysfunction. She does not report any history of heart failure is not on any diuretics currently. Her BNP was  normal. Troponins and EKG unremarkable. This is unlikely to be related to heart failure. -Cancel echocardiogram.  #Type 2 diabetes Blood sugar low this morning. -Hold home glipizide and metformin. -CBGs with sliding scale insulin moderate before meals at bedtime.  #Hypertension She is hypotensive due to her sepsis. -Hold home amlodipine 5 mg daily, Coreg 12.5 mg twice a day, lisinopril 5 mg daily in the setting of sepsis.  #Hypothyroidism -Continue Synthroid thousand micrograms daily.  #GERD -Continue Protonix 40 mg daily.  #Depression -Continue home Paxil 20 mg daily.  #Constipation -Senna-docusate daily.   DVT PPX - heparin  CODE STATUS - Full.  CONSULTS PLACED - None.  DISPO - Disposition is deferred at this time, awaiting improvement of sepsis.   Anticipated discharge in approximately 1-3 day(s).   The patient does have a current PCP (Axel Filler, MD) and does need an [facility] hospital follow-up appointment after discharge.    Is the [facility] hospital follow-up appointment a one-time only appointment? not applicable.  Does the patient have transportation limitations that hinder transportation to clinic appointments? yes   SERVICE NEEDED AT [place]         Y = Yes, Blank = No PT:   OT:   RN:   Equipment:   Other:      Length of Stay: 1 day(s)   Signed: Arman Filter, MD  PGY-1, Internal Medicine Resident Pager: [phone] (7AM-5PM) 03/24/2014, 11:02 AM

## 2014-03-25 LAB — URINE CULTURE
Colony Count: NO GROWTH
Culture: NO GROWTH

## 2014-03-25 LAB — GLUCOSE, CAPILLARY
Glucose-Capillary: 202 mg/dL — ABNORMAL HIGH (ref 70–99)
Glucose-Capillary: 192 mg/dL — ABNORMAL HIGH (ref 70–99)

## 2014-03-25 LAB — CBC
HCT: 35.1 % — ABNORMAL LOW (ref 36.0–46.0)
Hemoglobin: 11.9 g/dL — ABNORMAL LOW (ref 12.0–15.0)
MCH: 29 pg (ref 26.0–34.0)
MCHC: 33.9 g/dL (ref 30.0–36.0)
MCV: 85.4 fL (ref 78.0–100.0)
Platelets: 183 10*3/uL (ref 150–400)
RBC: 4.11 MIL/uL (ref 3.87–5.11)
RDW: 14.9 % (ref 11.5–15.5)
WBC: 7.5 10*3/uL (ref 4.0–10.5)

## 2014-03-25 LAB — BASIC METABOLIC PANEL
Anion gap: 6 (ref 5–15)
BUN: 13 mg/dL (ref 6–23)
CO2: 23 mmol/L (ref 19–32)
Calcium: 9.5 mg/dL (ref 8.4–10.5)
Chloride: 111 mmol/L (ref 96–112)
Creatinine, Ser: 0.94 mg/dL (ref 0.50–1.10)
GFR calc Af Amer: 67 mL/min — ABNORMAL LOW (ref >90)
GFR calc non Af Amer: 57 mL/min — ABNORMAL LOW (ref >90)
Glucose, Bld: 212 mg/dL — ABNORMAL HIGH (ref 70–99)
Potassium: 4.1 mmol/L (ref 3.5–5.1)
Sodium: 140 mmol/L (ref 135–145)

## 2014-03-25 LAB — MAGNESIUM: Magnesium: 1.8 mg/dL (ref 1.5–2.5)

## 2014-03-25 MED ORDER — LEVOFLOXACIN 500 MG PO TABS: 750.0000 mg | ORAL_TABLET | Freq: Every day | ORAL | Status: DC

## 2014-03-25 MED ORDER — FLUTICASONE-SALMETEROL 250-50 MCG/DOSE IN AEPB: 1.0000 | INHALATION_SPRAY | Freq: Two times a day (BID) | RESPIRATORY_TRACT | Status: DC

## 2014-03-25 MED ORDER — DM-GUAIFENESIN ER 30-600 MG PO TB12: 1.0000 | ORAL_TABLET | Freq: Two times a day (BID) | ORAL | Status: DC | PRN

## 2014-03-25 MED ORDER — PREDNISONE 20 MG PO TABS: 60.0000 mg | ORAL_TABLET | Freq: Every day | ORAL | Status: DC

## 2014-03-25 MED ORDER — BENZONATATE 100 MG PO CAPS: 100.0000 mg | ORAL_CAPSULE | Freq: Two times a day (BID) | ORAL | Status: DC | PRN

## 2014-03-25 MED ORDER — CARVEDILOL 12.5 MG PO TABS: 12.5000 mg | ORAL_TABLET | Freq: Two times a day (BID) | ORAL | Status: DC

## 2014-03-25 MED ORDER — LEVOFLOXACIN 750 MG PO TABS: 750.0000 mg | ORAL_TABLET | Freq: Every day | ORAL | Status: AC

## 2014-03-25 MED ORDER — AMLODIPINE BESYLATE 5 MG PO TABS
5.0000 mg | ORAL_TABLET | Freq: Every day | ORAL | Status: DC
Start: 2014-03-25 — End: 2014-03-25

## 2014-03-25 NOTE — Discharge Instructions (Signed)
·   Thank you for allowing [place] to be involved in your healthcare while you were hospitalized at [facility].   Please note that there have been changes to your home medications.  --> PLEASE LOOK AT YOUR DISCHARGE MEDICATION LIST FOR DETAILS.   Please call your PCP if you have any questions or concerns, or any difficulty getting any of your medications.  Please return to the ER if you have worsening of your symptoms or new severe symptoms arise.  Make sure to take prednisone for 3 more days and levofloxacin (an antibiotic) for 5 more days.  Call the clinic to schedule a follow up appointment.

## 2014-03-25 NOTE — Progress Notes (Signed)
Utilization review completed.  

## 2014-03-25 NOTE — Progress Notes (Signed)
Caitlyn Aguirre discharged home per MD order. Discharge instructions reviewed and discussed with patient. All questions and concerns answered. Copy of instructions and scripts given to patient. IV removed.  Patient escorted to car by staff in a wheelchair. No distress noted upon discharge.   [name] 03/25/2014 2:57 PM

## 2014-03-25 NOTE — Progress Notes (Addendum)
Subjective:    She reports feeling great this morning with resolution of her shortness of breath and abdominal pain. She has no complaints this morning and would like to go home. She says she has taken Advair at home in the past and it has helped with her COPD.  Interval history:  -Pro-calcitonin 0.32. -Morning labs not drawn. -Blood pressure improved this morning with vital signs stable.    Objective:    Vital Signs:   Temp:  [97.8 F (36.6 C)-98.8 F (37.1 C)] 98.8 F (37.1 C) (02/21 0335) Pulse Rate:  [73-78] 76 (02/21 0335) Resp:  [17-23] 18 (02/21 0335) BP: (116-146)/(50-61) 146/59 mmHg (02/21 0335) SpO2:  [91 %-100 %] 94 % (02/21 0838) Weight:  [216 lb 0.8 oz (98 kg)] 216 lb 0.8 oz (98 kg) (02/21 0500) Last BM Date: 03/24/14  24-hour weight change: Weight change: -2 lb 4 oz (-1.02 kg)  Intake/Output:   Intake/Output Summary (Last 24 hours) at 03/25/14 0957 Last data filed at 03/25/14 0802  Gross per 24 hour  Intake   1710 ml  Output    475 ml  Net   1235 ml      Physical Exam: General: Well-developed, well-nourished, in no acute distress; alert, appropriate and cooperative throughout examination.  Lungs:  Resolution of wheezing, good air movement.   Heart: RRR. S1 and S2 normal without gallop, murmur, or rubs.  Abdomen:  Soft, Nondistended, nontender.  No masses or organomegaly.  Extremities: No pretibial edema.     Labs:  Basic Metabolic Panel:  Recent Labs Lab 03/23/14 1840 03/24/14 0322 03/24/14 1026  NA 136 139  --   K 3.5 4.1  --   CL 102 111  --   CO2 25 20  --   GLUCOSE 157* 52*  --   BUN 14 20  --   CREATININE 1.53* 1.40*  --   CALCIUM 9.1 8.5  --   MG  --   --  1.3*  PHOS  --   --  2.5    Liver Function Tests:  Recent Labs Lab 03/23/14 1840 03/24/14 0322  AST 21 28  ALT 15 20  ALKPHOS 105 92  BILITOT 0.9 1.0  PROT 6.9 5.6*  ALBUMIN 3.7 3.2*   CBC:  Recent Labs Lab 03/23/14 1840 03/24/14 0322  WBC 8.3 5.7    NEUTROABS 6.8 4.3  HGB 12.6 11.3*  HCT 37.7 33.1*  MCV 86.3 84.2  PLT 184 103*    Cardiac Enzymes:  Recent Labs Lab 03/23/14 2228 03/24/14 0322 03/24/14 1026  TROPONINI <0.03 <0.03 <0.03    CBG:  Recent Labs Lab 03/24/14 0816 03/24/14 1236 03/24/14 1621 03/24/14 2130 03/25/14 0609  GLUCAP 70 119* 292* 316* 202*    Microbiology: Results for orders placed or performed during the hospital encounter of 03/23/14  MRSA PCR Screening     Status: None   Collection Time: 03/23/14  9:22 PM  Result Value Ref Range Status   MRSA by PCR NEGATIVE NEGATIVE Final    Comment:        The GeneXpert MRSA Assay (FDA approved for NASAL specimens only), is one component of a comprehensive MRSA colonization surveillance program. It is not intended to diagnose MRSA infection nor to guide or monitor treatment for MRSA infections.   Culture, blood (routine x 2)     Status: None (Preliminary result)   Collection Time: 03/24/14  9:40 AM  Result Value Ref Range Status   Specimen Description BLOOD LEFT  HAND  Final   Special Requests BOTTLES DRAWN AEROBIC ONLY 3CC  Final   Culture   Final           BLOOD CULTURE RECEIVED NO GROWTH TO DATE CULTURE WILL BE HELD FOR 5 DAYS BEFORE ISSUING A FINAL NEGATIVE REPORT Performed at [facility]    Report Status PENDING  Incomplete  Culture, blood (routine x 2)     Status: None (Preliminary result)   Collection Time: 03/24/14 10:26 AM  Result Value Ref Range Status   Specimen Description BLOOD LEFT ARM  Final   Special Requests BOTTLES DRAWN AEROBIC ONLY 8CC  Final   Culture   Final           BLOOD CULTURE RECEIVED NO GROWTH TO DATE CULTURE WILL BE HELD FOR 5 DAYS BEFORE ISSUING A FINAL NEGATIVE REPORT Performed at [facility]    Report Status PENDING  Incomplete    Coagulation Studies:  Recent Labs  03/24/14 1026  LABPROT 14.2  INR 1.09    Imaging: Dg Chest 2 View  03/23/2014   CLINICAL DATA:  Cough for 10 days,  productive cough, shortness of breath, hospitalized in January, bronchitis, type 2 diabetes, hypertension, COPD, GERD, polymyalgia rheumatica syndrome, bronchiectasis, idiopathic cardiomyopathy, dyslipidemia, stroke, CHF, MI  EXAM: CHEST  2 VIEW  COMPARISON:  02/14/2014 and 04/03/2011, prior CT chest 02/27/2004  FINDINGS: Enlargement of cardiac silhouette.  Tortuous aorta.  Mediastinal contours and pulmonary vascularity otherwise normal.  Enlargement of RIGHT hilum appears unchanged since previous exam as well as an earlier study of 04/03/2011 question due to pulmonary arterial hypertension based on prior CT.  Chronic atelectasis LEFT base.  Minimal central peribronchial thickening.  No infiltrate, pleural effusion or pneumothorax.  Scattered endplate spur formation thoracic spine.  IMPRESSION: Enlargement of cardiac silhouette.  Bronchitic changes with atelectasis versus scarring at LEFT base.  Stable prominence of RIGHT hilum since 2013 question due to pulmonary arterial hypertension.   Electronically Signed   By: Lavonia Dana M.D.   On: 03/23/2014 16:43       Medications:    Infusions:    Scheduled Medications: . aspirin EC  81 mg Oral Daily  . atorvastatin  20 mg Oral q1800  . dextromethorphan-guaiFENesin  1 tablet Oral BID  . fluticasone  2 spray Each Nare Daily  . heparin subcutaneous  5,000 Units Subcutaneous 3 times per day  . insulin aspart  0-15 Units Subcutaneous TID WC  . ipratropium-albuterol  3 mL Nebulization TID  . levofloxacin (LEVAQUIN) IV  750 mg Intravenous Q48H  . levothyroxine  100 mcg Oral QAC breakfast  . mometasone-formoterol  2 puff Inhalation BID  . pantoprazole  40 mg Oral Daily  . PARoxetine  20 mg Oral q morning - 10a  . predniSONE  60 mg Oral Q breakfast  . senna-docusate  1 tablet Oral Daily  . sodium chloride  3 mL Intravenous Q12H  . tiotropium  18 mcg Inhalation Daily    PRN Medications: sodium chloride, acetaminophen **OR** acetaminophen, albuterol,  benzonatate, ondansetron, sodium chloride, zolpidem   Assessment/ Plan:    Principal Problem:   Sepsis secondary to UTI Active Problems:   Hypothyroidism   Diabetes 1.5, managed as type 2   Hyperlipidemia   Depression   Essential hypertension   Chronic diastolic heart failure   GASTROESOPHAGEAL REFLUX DISEASE   COPD exacerbation   Acute kidney failure  #Sepsis secondary to UTI Blood pressure improved, and she has much more energy  this morning. Cortisol normal. -Switched to by mouth Levaquin, day 2 of 7. -Follow-up urine and blood cultures. -Follow pro-calcitonin. -Follow-up HIV antibody. -Tylenol 650 mg every 6 hours as needed for fever or pain. -Card modified diet. -Discharge home this afternoon after morning labs return.  #COPD exacerbation Cough is improved, and she denies any shortness of breath currently. She benefited from Advair in the past, but Ruthe Mannan is cheaper and she is willing to try it. Influenza panel negative. -Continue oral prednisone 60 mg daily, day 2 of 5. -Levaquin as above. -DuoNeb's every 4 hours. -Albuterol nebulizers 3 times daily. -Continue Dulera 2 puffs twice a day at discharge. -Continue Mucinex DM and Tessalon Perles. -Continue home Spiriva. -Flonase daily. She does not need this at discharge.  #Acute kidney injury Morning labs not back, most likely prerenal. -Follow-up morning creatinine.  #Type 2 diabetes Blood sugar elevated after holding home glipizide and on steroids yesterday, this will improve after she stops her prednisone. -Hold home glipizide and metformin. -CBGs with sliding scale insulin moderate before meals at bedtime.  #Hypertension Blood pressure elevated this morning off of home blood pressure medications. -Restart home Coreg 12.5 mg twice a day. -Restart home amlodipine 5 mg daily. -Continue to hold lisinopril until creatinine comes back.  #Hypothyroidism -Continue Synthroid 100 g daily.  #GERD -Continue  Protonix 40 mg daily.  #Depression -Continue home Paxil 20 mg daily.  #Constipation -Senna-docusate daily.   DVT PPX - heparin  CODE STATUS - Full.  CONSULTS PLACED - None.  DISPO - discharge home this afternoon.  The patient does have a current PCP (Axel Filler, MD) and does need an [facility] hospital follow-up appointment after discharge.    Is the [facility] hospital follow-up appointment a one-time only appointment? not applicable.  Does the patient have transportation limitations that hinder transportation to clinic appointments? yes   SERVICE NEEDED AT [place]         Y = Yes, Blank = No PT:   OT:   RN:   Equipment:   Other:      Length of Stay: 2 day(s)   Signed: Arman Filter, MD  PGY-1, Internal Medicine Resident Pager: [phone] (7AM-5PM) 03/25/2014, 9:57 AM

## 2014-03-25 NOTE — Discharge Summary (Signed)
Name: Caitlyn Aguirre MRN: [id number] DOB: 10-23-1935 79 y.o. PCP: Axel Filler, MD  Date of Admission: 03/23/2014  5:43 PM Date of Discharge: 03/25/2014 Attending Physician: Madilyn Fireman, MD  Discharge Diagnosis: Principal Problem:   Sepsis secondary to UTI Active Problems:   Hypothyroidism   Diabetes 1.5, managed as type 2   Hyperlipidemia   Depression   Essential hypertension   Chronic diastolic heart failure   GASTROESOPHAGEAL REFLUX DISEASE   COPD exacerbation   Acute kidney failure  Discharge Medications:   Medication List    STOP taking these medications        guaiFENesin-dextromethorphan 100-10 MG/5ML syrup  Commonly known as:  ROBITUSSIN DM  Replaced by:  dextromethorphan-guaiFENesin 30-600 MG per 12 hr tablet     HYDROcodone-acetaminophen 5-325 MG per tablet  Commonly known as:  NORCO/VICODIN      TAKE these medications        ACCU-CHEK FASTCLIX LANCETS Misc  Use to check blood sugar 2 times daily. diag code 250.00. Non-insulin dependent     albuterol 108 (90 BASE) MCG/ACT inhaler  Commonly known as:  PROAIR HFA  Inhale 2 puffs into the lungs every 6 (six) hours as needed for wheezing or shortness of breath.     amLODipine 5 MG tablet  Commonly known as:  NORVASC  TAKE 1 TABLET BY MOUTH EVERY DAY     aspirin EC 81 MG tablet  Take 1 tablet (81 mg total) by mouth daily.     atorvastatin 20 MG tablet  Commonly known as:  LIPITOR  Take 1 tablet (20 mg total) by mouth daily.     benzonatate 100 MG capsule  Commonly known as:  TESSALON  Take 1 capsule (100 mg total) by mouth 2 (two) times daily as needed for cough.     carvedilol 25 MG tablet  Commonly known as:  COREG  Take 1 tablet (25 mg total) by mouth 2 (two) times daily with a meal.     dextromethorphan-guaiFENesin 30-600 MG per 12 hr tablet  Commonly known as:  MUCINEX DM  Take 1 tablet by mouth 2 (two) times daily as needed for cough.     Fluticasone-Salmeterol 250-50 MCG/DOSE  Aepb  Commonly known as:  ADVAIR DISKUS  Inhale 1 puff into the lungs 2 (two) times daily.     glipiZIDE 5 MG 24 hr tablet  Commonly known as:  GLIPIZIDE XL  Take 1 tablet (5 mg total) by mouth daily.     glucose blood test strip  Commonly known as:  ACCU-CHEK SMARTVIEW  Use as instructed     levofloxacin 750 MG tablet  Commonly known as:  LEVAQUIN  Take 1 tablet (750 mg total) by mouth daily.     levothyroxine 100 MCG tablet  Commonly known as:  SYNTHROID, LEVOTHROID  TAKE ONE TABLET BY MOUTH EVERY DAY     lisinopril 5 MG tablet  Commonly known as:  PRINIVIL,ZESTRIL  TAKE 1 TABLET BY MOUTH DAILY     metFORMIN 1000 MG tablet  Commonly known as:  GLUCOPHAGE  Take 1 tablet (1,000 mg total) by mouth 2 (two) times daily with a meal.     omeprazole 20 MG capsule  Commonly known as:  PRILOSEC  TAKE 1 CAPSULE BY MOUTH EVERY DAY     ondansetron 4 MG tablet  Commonly known as:  ZOFRAN  Take 1 tablet (4 mg total) by mouth daily as needed for nausea or vomiting.     PARoxetine  20 MG tablet  Commonly known as:  PAXIL  TAKE 1 TABLET BY MOUTH EVERY MORNING     predniSONE 20 MG tablet  Commonly known as:  DELTASONE  Take 3 tablets (60 mg total) by mouth daily with breakfast.     senna-docusate 8.6-50 MG per tablet  Commonly known as:  Senokot-S  Take 1 tablet by mouth daily.     SPIRIVA HANDIHALER 18 MCG inhalation capsule  Generic drug:  tiotropium  INHALE THE CONTENTS OF 1 CAPSULE VIA HANDIHALER BY MOUTH EVERY DAY     triamcinolone ointment 0.5 %  Commonly known as:  KENALOG  Apply 1 application topically 2 (two) times daily. Use for 1-2 weeks     zolpidem 5 MG tablet  Commonly known as:  AMBIEN  Take 1 tablet (5 mg total) by mouth at bedtime as needed for sleep.        Disposition and follow-up:   Ms.Sharra S Roesler was discharged from [facility] in Stable condition.  At the hospital follow up visit please address:  1.  Completion of prednisone  and levofloxacin.  2.  Labs / imaging needed at time of follow-up: None.  3.  Pending labs/ test needing follow-up: Blood culture no growth to date.  Follow-up Appointments:     Follow-up Information    Follow up with Axel Filler, MD.   Specialty:  Internal Medicine   Why:  For hospital follow up.   Contact information:   [place] [phone]       Discharge Instructions: Discharge Instructions    Call MD for:  difficulty breathing, headache or visual disturbances    Complete by:  As directed      Call MD for:  extreme fatigue    Complete by:  As directed      Call MD for:  persistant dizziness or light-headedness    Complete by:  As directed      Call MD for:  persistant nausea and vomiting    Complete by:  As directed      Call MD for:  redness, tenderness, or signs of infection (pain, swelling, redness, odor or green/yellow discharge around incision site)    Complete by:  As directed      Call MD for:  severe uncontrolled pain    Complete by:  As directed      Call MD for:  temperature >100.4    Complete by:  As directed      Diet - low sodium heart healthy    Complete by:  As directed      Increase activity slowly    Complete by:  As directed            Consultations: None.  Procedures Performed:  Dg Chest 2 View  03/23/2014   CLINICAL DATA:  Cough for 10 days, productive cough, shortness of breath, hospitalized in January, bronchitis, type 2 diabetes, hypertension, COPD, GERD, polymyalgia rheumatica syndrome, bronchiectasis, idiopathic cardiomyopathy, dyslipidemia, stroke, CHF, MI  EXAM: CHEST  2 VIEW  COMPARISON:  02/14/2014 and 04/03/2011, prior CT chest 02/27/2004  FINDINGS: Enlargement of cardiac silhouette.  Tortuous aorta.  Mediastinal contours and pulmonary vascularity otherwise normal.  Enlargement of RIGHT hilum appears unchanged since previous exam as well as an earlier study of 04/03/2011 question due to  pulmonary arterial hypertension based on prior CT.  Chronic atelectasis LEFT base.  Minimal central peribronchial thickening.  No infiltrate, pleural effusion or pneumothorax.  Scattered endplate spur formation thoracic spine.  IMPRESSION: Enlargement of cardiac silhouette.  Bronchitic changes with atelectasis versus scarring at LEFT base.  Stable prominence of RIGHT hilum since 2013 question due to pulmonary arterial hypertension.   Electronically Signed   By: Lavonia Dana M.D.   On: 03/23/2014 16:43   Admission HPI:  79 yo female with Copd ? Restrictive lung disease , CHF (EF=55%, 08/22/2008), Hypothyroidism, Dm2, apparently c/o cough, yellow sputum, x1 day, along with dyspnea, fever, generalized achiness. Pt denies any sick contact, or any recent travel. Pt brought to ED by her family for evaluation. Pt was noted to be hypotensive and also to have bronchitis on CXR. And will be admitted for hcap, hypotension.   Hospital Course by problem list: Principal Problem:   Sepsis secondary to UTI Active Problems:   Hypothyroidism   Diabetes 1.5, managed as type 2   Hyperlipidemia   Depression   Essential hypertension   Chronic diastolic heart failure   GASTROESOPHAGEAL REFLUX DISEASE   COPD exacerbation   Acute kidney failure   #Sepsis secondary to UTI She was admitted by the hospitalist service for shortness of breath and increased productive cough associated with hypotension. She was initially thought to have HCAP, so she was started on Levaquin and vancomycin and given IV fluids. However, chest x-ray did not show any evidence of pneumonia. She did complain of some lower abdominal pain, so urinalysis was collected and showed evidence of a urinary tract infection. She was transferred to the internal medicine teaching service, and her antibiotic coverage was narrowed to IV Levaquin alone. She continued to improve and was switched to oral Levaquin, which was continued on discharge for a total of 7  days. She will complete treatment on 03/30/2014.  #COPD exacerbation She was noted to have wheezing on exam and shortness of breath consistent with a COPD exacerbation. She did report increased productive cough. She was given intravenous steroids and DuoNeb's along with Mucinex DM and Tessalon Perles with improvement of her symptoms. She was discharged on oral prednisone to complete a five-day course. The Levaquin she received for her UTI will also treat her COPD exacerbation. Looking back at her previous pulmonary function tests, she did not have evidence of obstruction. In fact, her tests were consistent with restrictive lung disease. Despite this, she responded to COPD treatment. She reported benefiting from being on Advair in the past, so this was restarted at discharge. Further workup was deferred to her PCP.  #Acute kidney injury She is noted to have acute kidney injury on presentation, which was likely prerenal. Her creatinine returned to baseline with IV hydration.  #Hypertension She was hypotensive on admission, and her home blood pressure medications were initially held. She was restarted on her home Coreg, amlodipine, and lisinopril at discharge.  Discharge Vitals:   BP 146/59 mmHg  Pulse 76  Temp(Src) 98.8 F (37.1 C) (Oral)  Resp 18  Ht 5\' 8"  (1.727 m)  Wt 216 lb 0.8 oz (98 kg)  BMI 32.86 kg/m2  SpO2 94%  Discharge Labs:  Results for orders placed or performed during the hospital encounter of 03/23/14 (from the past 24 hour(s))  Glucose, capillary     Status: Abnormal   Collection Time: 03/24/14  4:21 PM  Result Value Ref Range   Glucose-Capillary 292 (H) 70 - 99 mg/dL  Glucose, capillary     Status: Abnormal   Collection Time: 03/24/14  9:30 PM  Result Value Ref Range   Glucose-Capillary 316 (H)  70 - 99 mg/dL  Basic metabolic panel     Status: Abnormal   Collection Time: 03/25/14  5:00 AM  Result Value Ref Range   Sodium 140 135 - 145 mmol/L   Potassium 4.1 3.5 - 5.1  mmol/L   Chloride 111 96 - 112 mmol/L   CO2 23 19 - 32 mmol/L   Glucose, Bld 212 (H) 70 - 99 mg/dL   BUN 13 6 - 23 mg/dL   Creatinine, Ser 0.94 0.50 - 1.10 mg/dL   Calcium 9.5 8.4 - 10.5 mg/dL   GFR calc non Af Amer 57 (L) >90 mL/min   GFR calc Af Amer 67 (L) >90 mL/min   Anion gap 6 5 - 15  CBC     Status: Abnormal   Collection Time: 03/25/14  5:00 AM  Result Value Ref Range   WBC 7.5 4.0 - 10.5 K/uL   RBC 4.11 3.87 - 5.11 MIL/uL   Hemoglobin 11.9 (L) 12.0 - 15.0 g/dL   HCT 35.1 (L) 36.0 - 46.0 %   MCV 85.4 78.0 - 100.0 fL   MCH 29.0 26.0 - 34.0 pg   MCHC 33.9 30.0 - 36.0 g/dL   RDW 14.9 11.5 - 15.5 %   Platelets 183 150 - 400 K/uL  Magnesium     Status: None   Collection Time: 03/25/14  5:00 AM  Result Value Ref Range   Magnesium 1.8 1.5 - 2.5 mg/dL  Glucose, capillary     Status: Abnormal   Collection Time: 03/25/14  6:09 AM  Result Value Ref Range   Glucose-Capillary 202 (H) 70 - 99 mg/dL  Glucose, capillary     Status: Abnormal   Collection Time: 03/25/14 12:04 PM  Result Value Ref Range   Glucose-Capillary 192 (H) 70 - 99 mg/dL    Signed: Arman Filter, MD 03/25/2014, 1:04 PM    Services Ordered on Discharge: None. Equipment Ordered on Discharge: None.

## 2014-03-26 LAB — HEMOGLOBIN A1C
Hgb A1c MFr Bld: 7.4 % — ABNORMAL HIGH (ref 4.8–5.6)
Mean Plasma Glucose: 166 mg/dL

## 2014-03-26 LAB — HIV ANTIBODY (ROUTINE TESTING W REFLEX): HIV Screen 4th Generation wRfx: NONREACTIVE

## 2014-03-27 ENCOUNTER — Encounter: Payer: Self-pay | Admitting: *Deleted

## 2014-03-27 NOTE — Progress Notes (Signed)
Pt called stating she was told to stop metformin when she was d/c from hospital. She is concerned what she should be taking.   I looked at d/c summary and Metformin has not been removed. I pager Dr Trudee Kuster, d/c doctor and he states metformin was not stopped.  Pt informed to continue taking this med and bring all meds to [place] appointment.

## 2014-03-30 LAB — CULTURE, BLOOD (ROUTINE X 2)
Culture: NO GROWTH
Culture: NO GROWTH

## 2014-04-05 ENCOUNTER — Telehealth: Payer: Self-pay | Admitting: Internal Medicine

## 2014-04-05 NOTE — Telephone Encounter (Signed)
All to patient to confirm appointment for 04/09/14 at 3:15 no answer

## 2014-04-09 ENCOUNTER — Ambulatory Visit (INDEPENDENT_AMBULATORY_CARE_PROVIDER_SITE_OTHER): Payer: Medicare Other | Admitting: Internal Medicine

## 2014-04-09 ENCOUNTER — Encounter: Payer: Self-pay | Admitting: Internal Medicine

## 2014-04-09 VITALS — BP 155/69 | HR 68 | Temp 98.1°F | Resp 20 | Ht 67.5 in | Wt 219.4 lb

## 2014-04-09 DIAGNOSIS — M150 Primary generalized (osteo)arthritis: Secondary | ICD-10-CM | POA: Diagnosis not present

## 2014-04-09 DIAGNOSIS — N390 Urinary tract infection, site not specified: Secondary | ICD-10-CM | POA: Diagnosis not present

## 2014-04-09 DIAGNOSIS — J449 Chronic obstructive pulmonary disease, unspecified: Secondary | ICD-10-CM | POA: Diagnosis not present

## 2014-04-09 DIAGNOSIS — M159 Polyosteoarthritis, unspecified: Secondary | ICD-10-CM

## 2014-04-09 DIAGNOSIS — A419 Sepsis, unspecified organism: Secondary | ICD-10-CM | POA: Diagnosis not present

## 2014-04-09 DIAGNOSIS — M8949 Other hypertrophic osteoarthropathy, multiple sites: Secondary | ICD-10-CM

## 2014-04-09 LAB — GLUCOSE, CAPILLARY: Glucose-Capillary: 139 mg/dL — ABNORMAL HIGH (ref 70–99)

## 2014-04-09 MED ORDER — HYDROCODONE-ACETAMINOPHEN 5-325 MG PO TABS: 1.0000 | ORAL_TABLET | Freq: Two times a day (BID) | ORAL | Status: DC | PRN

## 2014-04-09 NOTE — Assessment & Plan Note (Signed)
Pt completed course of abx and has no abdominal pain or urinary symptoms.

## 2014-04-09 NOTE — Assessment & Plan Note (Signed)
Breathing doing well. Pt completed course of Levaquin and Prednisone. Lungs clear on exam. - Continue Advair, Spiriva, and albuterol PRN

## 2014-04-09 NOTE — Patient Instructions (Signed)
You look great today!  Start checking your blood sugars at home!   General Instructions:   Please bring your medicines with you each time you come to clinic.  Medicines may include prescription medications, over-the-counter medications, herbal remedies, eye drops, vitamins, or other pills.   Progress Toward Treatment Goals:  Treatment Goal 02/14/2014  Hemoglobin A1C unchanged  Blood pressure at goal    Self Care Goals & Plans:  Self Care Goal 04/09/2014  Manage my medications take my medicines as prescribed; bring my medications to every visit; refill my medications on time  Monitor my health check my feet daily  Eat healthy foods eat baked foods instead of fried foods; drink diet soda or water instead of juice or soda; eat foods that are low in salt  Be physically active find an activity I enjoy    Home Blood Glucose Monitoring 02/14/2014  Check my blood sugar no home glucose monitoring     Care Management & Community Referrals:  Referral 04/09/2014  Referrals made for care management support -  Referrals made to community resources nutrition; falls prevention
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
## 2014-04-09 NOTE — Assessment & Plan Note (Signed)
Pt has a pain contract for Vicodin, which has not been filled since January. Will refill today due to pain in her knees. - Vicodin 5-325mg  1 tablet BID PRN pain, disp #60, 0 refills.

## 2014-04-10 NOTE — Progress Notes (Signed)
Internal Medicine Clinic Attending  Case discussed with Dr. Glenn soon after the resident saw the patient.  We reviewed the resident's history and exam and pertinent patient test results.  I agree with the assessment, diagnosis, and plan of care documented in the resident's note. 

## 2014-04-13 ENCOUNTER — Telehealth: Payer: Self-pay | Admitting: *Deleted

## 2014-04-13 NOTE — Telephone Encounter (Signed)
Caitlyn Aguirre with [facility] [phone] - called about pt. Pt had appt in clinic 04/09/14 and again 04/18/14. Message was left on ID recording. Hilda Blades [name] RN 04/13/14 3:30PM

## 2014-04-17 ENCOUNTER — Telehealth: Payer: Self-pay | Admitting: Internal Medicine

## 2014-04-17 DIAGNOSIS — Z961 Presence of intraocular lens: Secondary | ICD-10-CM | POA: Diagnosis not present

## 2014-04-17 DIAGNOSIS — H40013 Open angle with borderline findings, low risk, bilateral: Secondary | ICD-10-CM | POA: Diagnosis not present

## 2014-04-17 DIAGNOSIS — H26492 Other secondary cataract, left eye: Secondary | ICD-10-CM | POA: Diagnosis not present

## 2014-04-17 DIAGNOSIS — H02831 Dermatochalasis of right upper eyelid: Secondary | ICD-10-CM | POA: Diagnosis not present

## 2014-04-17 DIAGNOSIS — E119 Type 2 diabetes mellitus without complications: Secondary | ICD-10-CM | POA: Diagnosis not present

## 2014-04-17 LAB — HM DIABETES EYE EXAM

## 2014-04-17 NOTE — Telephone Encounter (Signed)
Call to patient to confirm appointment for 04/18/14 at 8:45 lmtcb

## 2014-04-18 ENCOUNTER — Encounter: Payer: Self-pay | Admitting: Internal Medicine

## 2014-04-18 ENCOUNTER — Encounter: Payer: Medicare HMO | Admitting: Internal Medicine

## 2014-04-19 ENCOUNTER — Encounter: Payer: Self-pay | Admitting: *Deleted

## 2014-04-20 ENCOUNTER — Encounter: Payer: Self-pay | Admitting: *Deleted

## 2014-04-24 ENCOUNTER — Other Ambulatory Visit: Payer: Self-pay | Admitting: Internal Medicine

## 2014-04-25 ENCOUNTER — Other Ambulatory Visit: Payer: Self-pay | Admitting: Internal Medicine

## 2014-04-25 DIAGNOSIS — H26492 Other secondary cataract, left eye: Secondary | ICD-10-CM | POA: Diagnosis not present

## 2014-04-25 DIAGNOSIS — Z961 Presence of intraocular lens: Secondary | ICD-10-CM | POA: Diagnosis not present

## 2014-04-25 NOTE — Telephone Encounter (Signed)
Refill approved - nurse to call in. 

## 2014-04-25 NOTE — Telephone Encounter (Signed)
Called to PPA 

## 2014-05-04 ENCOUNTER — Encounter: Payer: Self-pay | Admitting: Internal Medicine

## 2014-05-04 ENCOUNTER — Ambulatory Visit (INDEPENDENT_AMBULATORY_CARE_PROVIDER_SITE_OTHER): Payer: Medicare Other | Admitting: Internal Medicine

## 2014-05-04 VITALS — BP 181/87 | HR 76 | Temp 98.0°F | Ht 68.0 in | Wt 212.7 lb

## 2014-05-04 DIAGNOSIS — J449 Chronic obstructive pulmonary disease, unspecified: Secondary | ICD-10-CM | POA: Diagnosis not present

## 2014-05-04 DIAGNOSIS — E139 Other specified diabetes mellitus without complications: Secondary | ICD-10-CM

## 2014-05-04 DIAGNOSIS — L309 Dermatitis, unspecified: Secondary | ICD-10-CM

## 2014-05-04 DIAGNOSIS — M8949 Other hypertrophic osteoarthropathy, multiple sites: Secondary | ICD-10-CM

## 2014-05-04 DIAGNOSIS — M150 Primary generalized (osteo)arthritis: Secondary | ICD-10-CM

## 2014-05-04 DIAGNOSIS — I10 Essential (primary) hypertension: Secondary | ICD-10-CM

## 2014-05-04 DIAGNOSIS — M159 Polyosteoarthritis, unspecified: Secondary | ICD-10-CM

## 2014-05-04 LAB — GLUCOSE, CAPILLARY: Glucose-Capillary: 136 mg/dL — ABNORMAL HIGH (ref 70–99)

## 2014-05-04 MED ORDER — TRIAMCINOLONE ACETONIDE 0.5 % EX OINT: 1.0000 "application " | TOPICAL_OINTMENT | Freq: Two times a day (BID) | CUTANEOUS | Status: DC

## 2014-05-04 MED ORDER — BENZONATATE 100 MG PO CAPS: 100.0000 mg | ORAL_CAPSULE | Freq: Two times a day (BID) | ORAL | Status: DC | PRN

## 2014-05-04 NOTE — Assessment & Plan Note (Signed)
-   Has run out of her kenalog cream and requests a refill. - Refilled kenalog

## 2014-05-04 NOTE — Assessment & Plan Note (Signed)
-   Doing well with low usage of narcotic pain medication. Will defer to PCP to obtain Xrays of Lumbar spine and knees if needed.

## 2014-05-04 NOTE — Assessment & Plan Note (Signed)
BP Readings from Last 3 Encounters:  05/04/14 181/87  04/09/14 155/69  03/25/14 146/59    Lab Results  Component Value Date   NA 140 03/25/2014   K 4.1 03/25/2014   CREATININE 0.94 03/25/2014    Assessment: Blood pressure control: moderately elevated Progress toward BP goal:  deteriorated Comments: did not take medications today  Plan: Medications:  Lisinopril 5 mg daily, Amlodipine 5mg  daily, Coreg 25mg  BID Educational resources provided:   Self management tools provided:   Other plans: Instructed patient to take Coreg BID instead of daily and take medications prior to her next visit as she has mostly been well conrolled in the past.  If needed at next visit can increase lisinopril.

## 2014-05-04 NOTE — Patient Instructions (Signed)
General Instructions: Please start taking Carvedilol TWICE a day instead of just in the morning.  I want you to take your blood pressure medication before your appointment next time.  Thank you for bringing your medicines today. This helps [place] keep you safe from mistakes.   Progress Toward Treatment Goals:  Treatment Goal 05/04/2014  Hemoglobin A1C unable to assess  Blood pressure deteriorated    Self Care Goals & Plans:  Self Care Goal 05/04/2014  Manage my medications take my medicines as prescribed; bring my medications to every visit; refill my medications on time  Monitor my health check my feet daily  Eat healthy foods eat more vegetables; eat foods that are low in salt; eat baked foods instead of fried foods  Be physically active take a walk every day    Home Blood Glucose Monitoring 05/04/2014  Check my blood sugar no home glucose monitoring     Care Management & Community Referrals:  Referral 05/04/2014  Referrals made for care management support none needed  Referrals made to community resources -

## 2014-05-04 NOTE — Assessment & Plan Note (Signed)
Lab Results  Component Value Date   HGBA1C 7.4* 03/23/2014   HGBA1C 7.1* 01/12/2014   HGBA1C 6.6 10/12/2013     Assessment: Diabetes control: fair control Progress toward A1C goal:  unable to assess Comments:   Plan: Medications:  Metformin 1g daily, Glipizde XL 5mg  daily Home glucose monitoring: Frequency: no home glucose monitoring Timing:   Instruction/counseling given: reminded to get eye exam Educational resources provided: handout Self management tools provided:   Other plans:
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
## 2014-05-04 NOTE — Assessment & Plan Note (Signed)
-  She is compliant with her medications and doing well.  She liked the benzoate caps when she had a cough last and would like a Rx incease she develops a cough again which I feel is reasonable.

## 2014-05-07 NOTE — Progress Notes (Signed)
Internal Medicine Clinic Attending  Case discussed with Dr. Hoffman soon after the resident saw the patient.  We reviewed the resident's history and exam and pertinent patient test results.  I agree with the assessment, diagnosis, and plan of care documented in the resident's note. 

## 2014-05-22 ENCOUNTER — Other Ambulatory Visit: Payer: Self-pay | Admitting: Internal Medicine

## 2014-05-23 NOTE — Telephone Encounter (Signed)
Refill approved - nurse to call in. 

## 2014-05-23 NOTE — Telephone Encounter (Signed)
Called to pharm 

## 2014-07-04 ENCOUNTER — Other Ambulatory Visit: Payer: Self-pay | Admitting: *Deleted

## 2014-07-04 DIAGNOSIS — M8949 Other hypertrophic osteoarthropathy, multiple sites: Secondary | ICD-10-CM

## 2014-07-04 DIAGNOSIS — I10 Essential (primary) hypertension: Secondary | ICD-10-CM

## 2014-07-04 DIAGNOSIS — M159 Polyosteoarthritis, unspecified: Secondary | ICD-10-CM

## 2014-07-04 DIAGNOSIS — M150 Primary generalized (osteo)arthritis: Principal | ICD-10-CM

## 2014-07-04 MED ORDER — LISINOPRIL 5 MG PO TABS: 5.0000 mg | ORAL_TABLET | Freq: Every day | ORAL | Status: DC

## 2014-07-04 MED ORDER — ATORVASTATIN CALCIUM 20 MG PO TABS: 20.0000 mg | ORAL_TABLET | Freq: Every day | ORAL | Status: DC

## 2014-07-04 MED ORDER — PAROXETINE HCL 20 MG PO TABS: 20.0000 mg | ORAL_TABLET | Freq: Every morning | ORAL | Status: DC

## 2014-07-04 MED ORDER — METFORMIN HCL 1000 MG PO TABS: 1000.0000 mg | ORAL_TABLET | Freq: Two times a day (BID) | ORAL | Status: DC

## 2014-07-04 MED ORDER — CARVEDILOL 25 MG PO TABS: 25.0000 mg | ORAL_TABLET | Freq: Two times a day (BID) | ORAL | Status: DC

## 2014-07-04 MED ORDER — ZOLPIDEM TARTRATE 5 MG PO TABS: 5.0000 mg | ORAL_TABLET | Freq: Every evening | ORAL | Status: DC | PRN

## 2014-07-04 MED ORDER — AMLODIPINE BESYLATE 5 MG PO TABS
ORAL_TABLET | ORAL | Status: DC
Start: 2014-07-04 — End: 2014-12-07

## 2014-07-04 MED ORDER — HYDROCODONE-ACETAMINOPHEN 5-325 MG PO TABS: 1.0000 | ORAL_TABLET | Freq: Two times a day (BID) | ORAL | Status: DC | PRN

## 2014-07-04 NOTE — Telephone Encounter (Signed)
Pt is changing pharmacy to mail order

## 2014-07-04 NOTE — Telephone Encounter (Signed)
Rx called in  Pt informed she needs an appointment in June

## 2014-07-04 NOTE — Telephone Encounter (Addendum)
Dr Heber [place] just addressed her pain so I am OK refilling it.  There is no documentation about necessity of ambien. Since on it long term, I filled #30 but she will need appt to receive more. I suggest June appt as will be easier than getting in in July.

## 2014-07-04 NOTE — Patient Outreach (Signed)
[place] [facility]) Care Management  07/04/2014  NOEMI BELLISSIMO 1935-10-10 [id number]   Patient phoned [place] with questions, patient is eligible for [facility] Care Management Services, assigned Maury Dus, RN and Deanne Coffer, PharmD to outreach.  [name]. [place], [place] Management [place] Assistant Phone: [phone] Fax: [phone]

## 2014-07-05 ENCOUNTER — Other Ambulatory Visit: Payer: Self-pay

## 2014-07-05 NOTE — Patient Outreach (Signed)
[facility]) Care Management  07/05/2014  Caitlyn Aguirre 10-Jan-1936 [id number]   RN CM attempted to reach patient to discuss [facility] services.  Patient was unavailable at the time and HIPPA compliant voice mail message left with return call back number.   RN CM will try back at a later date.  Maury Dus, RN, Ishmael Holter, [place] Telephonic Care Coordinator [phone]

## 2014-07-09 ENCOUNTER — Other Ambulatory Visit: Payer: Self-pay | Admitting: Pharmacist

## 2014-07-09 NOTE — Patient Outreach (Signed)
Received pharmacy referral for Caitlyn Aguirre regarding Caitlyn needing assistance with obtaining Caitlyn medications through mail order. Called and spoke with Caitlyn Aguirre and Caitlyn Aguirre. Caitlyn Aguirre reports that she had been out of two medications, but was able to get them by having Caitlyn doctor call them into Caitlyn local [place]. Reports that she thought that the medications were coming through mail order, but that they never did. After further discussion with the patient, it seems that she still needs to contact mail order pharmacy to initiate this process. Patient reports that she currently has [employer] Medicare Complete through [employer] (scan available in [place]). Provided patient and Caitlyn Aguirre with the contact number for [employer], [phone]. Patient reports that she will call to get that setup.  Caitlyn Aguirre states that she has no further questions for pharmacy at this time. Explained to Caitlyn Aguirre. Caitlyn Aguirre states that she does not need our help at this time. Provided patient with my contact information.  [name], PharmD Clinical Pharmacist [place] Management [phone]

## 2014-07-10 ENCOUNTER — Other Ambulatory Visit: Payer: Self-pay

## 2014-07-10 NOTE — Patient Outreach (Signed)
[facility]) Care Management  07/10/2014  Caitlyn Aguirre Nov 17, 1935 [id number]   Per [name], PharmD patient has met Pharmacy goals with [place] Management.  [name]. [place], [place] Management [place] Assistant Phone: [phone] Fax: [phone]

## 2014-07-10 NOTE — Patient Outreach (Signed)
[place] [facility]) Care Management  07/10/2014  SACHEEN ARRASMITH November 11, 1935 [id number]   RN CM has made 2 attempts to speak with patient and discuss the services of [facility].  Patient has been unavailable both times.  HIPPA compliant voice mail message left.  RN CM will try again to reach patient at a later date.  Maury Dus, RN, Ishmael Holter, [place] Telephonic Care Coordinator [phone]

## 2014-07-11 ENCOUNTER — Other Ambulatory Visit: Payer: Self-pay

## 2014-07-11 NOTE — Patient Outreach (Signed)
[place] [facility]) Care Management  07/11/2014  LEXANDRA RETTKE 1935/08/27 [id number]   RN CM spoke with patient and discussed the services of [facility].  [facility] pharmacy had been working with patient on obtaining her medications.  Pharmacy has helped patient with resources for obtaining her medications and has closed patient's case.  Patient stated she has no nursing needs.  Patient decline the services of [facility].  RN CM will close this case and notify primary doctor of patient's decision.  Maury Dus, RN, Ishmael Holter, [place] Telephonic Care Coordinator [phone]

## 2014-07-20 ENCOUNTER — Emergency Department (HOSPITAL_COMMUNITY): Payer: Medicare Other

## 2014-07-20 ENCOUNTER — Observation Stay (HOSPITAL_COMMUNITY)
Admission: EM | Admit: 2014-07-20 | Discharge: 2014-07-21 | Disposition: A | Discharge status: Home / Self Care | Payer: Medicare Other | Attending: Internal Medicine | Admitting: Internal Medicine

## 2014-07-20 ENCOUNTER — Encounter (HOSPITAL_COMMUNITY): Payer: Self-pay | Admitting: Emergency Medicine

## 2014-07-20 DIAGNOSIS — I429 Cardiomyopathy, unspecified: Secondary | ICD-10-CM | POA: Insufficient documentation

## 2014-07-20 DIAGNOSIS — K219 Gastro-esophageal reflux disease without esophagitis: Secondary | ICD-10-CM | POA: Diagnosis present

## 2014-07-20 DIAGNOSIS — M545 Low back pain: Secondary | ICD-10-CM | POA: Diagnosis not present

## 2014-07-20 DIAGNOSIS — Z8673 Personal history of transient ischemic attack (TIA), and cerebral infarction without residual deficits: Secondary | ICD-10-CM | POA: Insufficient documentation

## 2014-07-20 DIAGNOSIS — E785 Hyperlipidemia, unspecified: Secondary | ICD-10-CM | POA: Diagnosis present

## 2014-07-20 DIAGNOSIS — I252 Old myocardial infarction: Secondary | ICD-10-CM | POA: Diagnosis not present

## 2014-07-20 DIAGNOSIS — R0602 Shortness of breath: Secondary | ICD-10-CM | POA: Insufficient documentation

## 2014-07-20 DIAGNOSIS — R079 Chest pain, unspecified: Secondary | ICD-10-CM | POA: Diagnosis present

## 2014-07-20 DIAGNOSIS — E039 Hypothyroidism, unspecified: Secondary | ICD-10-CM | POA: Diagnosis present

## 2014-07-20 DIAGNOSIS — M353 Polymyalgia rheumatica: Secondary | ICD-10-CM | POA: Insufficient documentation

## 2014-07-20 DIAGNOSIS — R0789 Other chest pain: Principal | ICD-10-CM | POA: Insufficient documentation

## 2014-07-20 DIAGNOSIS — G4700 Insomnia, unspecified: Secondary | ICD-10-CM | POA: Insufficient documentation

## 2014-07-20 DIAGNOSIS — F329 Major depressive disorder, single episode, unspecified: Secondary | ICD-10-CM | POA: Diagnosis not present

## 2014-07-20 DIAGNOSIS — R072 Precordial pain: Secondary | ICD-10-CM | POA: Diagnosis not present

## 2014-07-20 DIAGNOSIS — E119 Type 2 diabetes mellitus without complications: Secondary | ICD-10-CM | POA: Diagnosis not present

## 2014-07-20 DIAGNOSIS — J449 Chronic obstructive pulmonary disease, unspecified: Secondary | ICD-10-CM | POA: Diagnosis present

## 2014-07-20 DIAGNOSIS — I5032 Chronic diastolic (congestive) heart failure: Secondary | ICD-10-CM | POA: Insufficient documentation

## 2014-07-20 DIAGNOSIS — E1169 Type 2 diabetes mellitus with other specified complication: Secondary | ICD-10-CM | POA: Diagnosis present

## 2014-07-20 DIAGNOSIS — F32A Depression, unspecified: Secondary | ICD-10-CM | POA: Diagnosis present

## 2014-07-20 DIAGNOSIS — I10 Essential (primary) hypertension: Secondary | ICD-10-CM | POA: Insufficient documentation

## 2014-07-20 DIAGNOSIS — E876 Hypokalemia: Secondary | ICD-10-CM | POA: Diagnosis not present

## 2014-07-20 DIAGNOSIS — Z7982 Long term (current) use of aspirin: Secondary | ICD-10-CM | POA: Diagnosis not present

## 2014-07-20 DIAGNOSIS — G8929 Other chronic pain: Secondary | ICD-10-CM | POA: Insufficient documentation

## 2014-07-20 MED ORDER — NITROGLYCERIN 2 % TD OINT
1.0000 [in_us] | TOPICAL_OINTMENT | Freq: Once | TRANSDERMAL | Status: AC
  Administered 2014-07-20: 1 [in_us] via TOPICAL
  Filled 2014-07-20: qty 1

## 2014-07-20 MED ORDER — NITROGLYCERIN 0.4 MG SL SUBL: 0.4000 mg | SUBLINGUAL_TABLET | SUBLINGUAL | Status: DC | PRN

## 2014-07-20 NOTE — ED Notes (Signed)
Per EMS:  Pt with family this evening for the funeral of her 77 year old grandson.  Pt began to have CP that radiated to left arm.  Pt initial BP on arrival of EMS was 220/116.  Pt given 324 ASA and 1 nitro.  BP dropped to 118/69.  Pt sts chest pain has resolved, only c/o pressure at this time and 8/10 HA.  EMS Sts EKG short multiform PVCs as well as some "flattening in V5 and 6".

## 2014-07-20 NOTE — ED Notes (Signed)
Pt tearful in room due to current personal situation.  Denies any physical distress.
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
## 2014-07-20 NOTE — ED Notes (Signed)
Phlebotomy at bedside.

## 2014-07-21 ENCOUNTER — Encounter (HOSPITAL_COMMUNITY): Payer: Self-pay | Admitting: General Practice

## 2014-07-21 ENCOUNTER — Other Ambulatory Visit: Payer: Self-pay

## 2014-07-21 DIAGNOSIS — J438 Other emphysema: Secondary | ICD-10-CM | POA: Diagnosis not present

## 2014-07-21 DIAGNOSIS — G4700 Insomnia, unspecified: Secondary | ICD-10-CM

## 2014-07-21 DIAGNOSIS — E119 Type 2 diabetes mellitus without complications: Secondary | ICD-10-CM | POA: Diagnosis not present

## 2014-07-21 DIAGNOSIS — E785 Hyperlipidemia, unspecified: Secondary | ICD-10-CM

## 2014-07-21 DIAGNOSIS — F329 Major depressive disorder, single episode, unspecified: Secondary | ICD-10-CM

## 2014-07-21 DIAGNOSIS — I10 Essential (primary) hypertension: Secondary | ICD-10-CM

## 2014-07-21 DIAGNOSIS — K219 Gastro-esophageal reflux disease without esophagitis: Secondary | ICD-10-CM

## 2014-07-21 DIAGNOSIS — R079 Chest pain, unspecified: Secondary | ICD-10-CM | POA: Diagnosis present

## 2014-07-21 DIAGNOSIS — R0789 Other chest pain: Secondary | ICD-10-CM

## 2014-07-21 DIAGNOSIS — J449 Chronic obstructive pulmonary disease, unspecified: Secondary | ICD-10-CM

## 2014-07-21 DIAGNOSIS — Z79899 Other long term (current) drug therapy: Secondary | ICD-10-CM

## 2014-07-21 DIAGNOSIS — Z634 Disappearance and death of family member: Secondary | ICD-10-CM

## 2014-07-21 DIAGNOSIS — E039 Hypothyroidism, unspecified: Secondary | ICD-10-CM | POA: Diagnosis not present

## 2014-07-21 DIAGNOSIS — Z7951 Long term (current) use of inhaled steroids: Secondary | ICD-10-CM

## 2014-07-21 LAB — I-STAT TROPONIN, ED: Troponin i, poc: 0.01 ng/mL (ref 0.00–0.08)

## 2014-07-21 LAB — BASIC METABOLIC PANEL
Anion gap: 11 (ref 5–15)
BUN: 9 mg/dL (ref 6–20)
CO2: 24 mmol/L (ref 22–32)
Calcium: 9.7 mg/dL (ref 8.9–10.3)
Chloride: 105 mmol/L (ref 101–111)
Creatinine, Ser: 0.81 mg/dL (ref 0.44–1.00)
GFR calc Af Amer: >60 mL/min (ref >60)
GFR calc non Af Amer: >60 mL/min (ref >60)
Glucose, Bld: 131 mg/dL — ABNORMAL HIGH (ref 65–99)
Potassium: 3.1 mmol/L — ABNORMAL LOW (ref 3.5–5.1)
Sodium: 140 mmol/L (ref 135–145)

## 2014-07-21 LAB — CBC
HCT: 35.7 % — ABNORMAL LOW (ref 36.0–46.0)
Hemoglobin: 12.1 g/dL (ref 12.0–15.0)
MCH: 28.6 pg (ref 26.0–34.0)
MCHC: 33.9 g/dL (ref 30.0–36.0)
MCV: 84.4 fL (ref 78.0–100.0)
Platelets: 205 10*3/uL (ref 150–400)
RBC: 4.23 MIL/uL (ref 3.87–5.11)
RDW: 14.9 % (ref 11.5–15.5)
WBC: 8.1 10*3/uL (ref 4.0–10.5)

## 2014-07-21 LAB — TROPONIN I
Troponin I: <0.03 ng/mL (ref <0.031)
Troponin I: <0.03 ng/mL (ref <0.031)

## 2014-07-21 LAB — MAGNESIUM: Magnesium: 1.4 mg/dL — ABNORMAL LOW (ref 1.7–2.4)

## 2014-07-21 LAB — GLUCOSE, CAPILLARY
Glucose-Capillary: 107 mg/dL — ABNORMAL HIGH (ref 65–99)
Glucose-Capillary: 131 mg/dL — ABNORMAL HIGH (ref 65–99)
Glucose-Capillary: 209 mg/dL — ABNORMAL HIGH (ref 65–99)

## 2014-07-21 LAB — BRAIN NATRIURETIC PEPTIDE: B Natriuretic Peptide: 17.8 pg/mL (ref 0.0–100.0)

## 2014-07-21 MED ORDER — POTASSIUM CHLORIDE IN NACL 20-0.9 MEQ/L-% IV SOLN
INTRAVENOUS | Status: DC
  Administered 2014-07-21: 07:00:00 via INTRAVENOUS
  Filled 2014-07-21: qty 1000

## 2014-07-21 MED ORDER — LISINOPRIL 5 MG PO TABS
5.0000 mg | ORAL_TABLET | Freq: Every day | ORAL | Status: DC
  Filled 2014-07-21: qty 1

## 2014-07-21 MED ORDER — LEVOTHYROXINE SODIUM 100 MCG PO TABS
100.0000 ug | ORAL_TABLET | Freq: Every day | ORAL | Status: DC
  Administered 2014-07-21: 100 ug via ORAL
  Filled 2014-07-21 (×2): qty 1

## 2014-07-21 MED ORDER — HYDROCODONE-ACETAMINOPHEN 5-325 MG PO TABS: 1.0000 | ORAL_TABLET | Freq: Two times a day (BID) | ORAL | Status: DC | PRN

## 2014-07-21 MED ORDER — PAROXETINE HCL 20 MG PO TABS
20.0000 mg | ORAL_TABLET | Freq: Every morning | ORAL | Status: DC
  Administered 2014-07-21: 20 mg via ORAL
  Filled 2014-07-21: qty 1

## 2014-07-21 MED ORDER — AMLODIPINE BESYLATE 5 MG PO TABS
5.0000 mg | ORAL_TABLET | Freq: Every day | ORAL | Status: DC
  Administered 2014-07-21: 5 mg via ORAL
  Filled 2014-07-21: qty 1

## 2014-07-21 MED ORDER — PANTOPRAZOLE SODIUM 40 MG PO TBEC
40.0000 mg | DELAYED_RELEASE_TABLET | Freq: Every day | ORAL | Status: DC
  Administered 2014-07-21: 40 mg via ORAL
  Filled 2014-07-21: qty 1

## 2014-07-21 MED ORDER — ZOLPIDEM TARTRATE 5 MG PO TABS: 5.0000 mg | ORAL_TABLET | Freq: Every evening | ORAL | Status: DC | PRN

## 2014-07-21 MED ORDER — POTASSIUM CHLORIDE CRYS ER 20 MEQ PO TBCR
40.0000 meq | EXTENDED_RELEASE_TABLET | Freq: Two times a day (BID) | ORAL | Status: DC
  Administered 2014-07-21: 40 meq via ORAL
  Filled 2014-07-21: qty 2

## 2014-07-21 MED ORDER — INSULIN ASPART 100 UNIT/ML ~~LOC~~ SOLN
0.0000 [IU] | SUBCUTANEOUS | Status: DC
  Administered 2014-07-21: 3 [IU] via SUBCUTANEOUS

## 2014-07-21 MED ORDER — ALBUTEROL SULFATE (2.5 MG/3ML) 0.083% IN NEBU
2.5000 mg | INHALATION_SOLUTION | Freq: Four times a day (QID) | RESPIRATORY_TRACT | Status: DC | PRN
Start: 2014-07-21 — End: 2014-07-21

## 2014-07-21 MED ORDER — ATORVASTATIN CALCIUM 40 MG PO TABS: 40.0000 mg | ORAL_TABLET | Freq: Every day | ORAL | Status: DC

## 2014-07-21 MED ORDER — TIOTROPIUM BROMIDE MONOHYDRATE 18 MCG IN CAPS
18.0000 ug | ORAL_CAPSULE | Freq: Every day | RESPIRATORY_TRACT | Status: DC
  Administered 2014-07-21: 18 ug via RESPIRATORY_TRACT
  Filled 2014-07-21: qty 5

## 2014-07-21 MED ORDER — ASPIRIN EC 81 MG PO TBEC: 81.0000 mg | DELAYED_RELEASE_TABLET | Freq: Every day | ORAL | Status: DC

## 2014-07-21 MED ORDER — SODIUM CHLORIDE 0.9 % IJ SOLN: 3.0000 mL | Freq: Two times a day (BID) | INTRAMUSCULAR | Status: DC

## 2014-07-21 MED ORDER — MOMETASONE FURO-FORMOTEROL FUM 100-5 MCG/ACT IN AERO
2.0000 | INHALATION_SPRAY | Freq: Two times a day (BID) | RESPIRATORY_TRACT | Status: DC
  Administered 2014-07-21: 2 via RESPIRATORY_TRACT
  Filled 2014-07-21: qty 8.8

## 2014-07-21 MED ORDER — ATORVASTATIN CALCIUM 20 MG PO TABS
20.0000 mg | ORAL_TABLET | Freq: Every day | ORAL | Status: DC
  Filled 2014-07-21: qty 1

## 2014-07-21 MED ORDER — ASPIRIN 81 MG PO CHEW
81.0000 mg | CHEWABLE_TABLET | Freq: Once | ORAL | Status: AC
  Administered 2014-07-21: 81 mg via ORAL
  Filled 2014-07-21: qty 1

## 2014-07-21 MED ORDER — LISINOPRIL 20 MG PO TABS: 20.0000 mg | ORAL_TABLET | Freq: Every day | ORAL | Status: DC

## 2014-07-21 MED ORDER — CARVEDILOL 25 MG PO TABS
25.0000 mg | ORAL_TABLET | Freq: Two times a day (BID) | ORAL | Status: DC
  Administered 2014-07-21: 25 mg via ORAL
  Filled 2014-07-21 (×3): qty 1

## 2014-07-21 MED ORDER — HEPARIN SODIUM (PORCINE) 5000 UNIT/ML IJ SOLN
5000.0000 [IU] | Freq: Three times a day (TID) | INTRAMUSCULAR | Status: DC
  Administered 2014-07-21: 5000 [IU] via SUBCUTANEOUS
  Filled 2014-07-21: qty 1

## 2014-07-21 MED ORDER — ACETAMINOPHEN 325 MG PO TABS
650.0000 mg | ORAL_TABLET | Freq: Once | ORAL | Status: AC
  Administered 2014-07-21: 650 mg via ORAL
  Filled 2014-07-21: qty 2

## 2014-07-21 MED ORDER — ATORVASTATIN CALCIUM 40 MG PO TABS
40.0000 mg | ORAL_TABLET | Freq: Every day | ORAL | Status: DC
  Filled 2014-07-21: qty 1

## 2014-07-21 NOTE — Progress Notes (Signed)
Subjective:  Feeling better. No chest pain currently. No sob. Only has some headache which she thinks is from her nitro patch. States she had a heart attack 15+ years ago. Has been doing fine but for last 1 week she was very stressed out with her family situation and her great grand daughter's illness who passed away on the day of the admission in her lap.   Objective: Vital signs in last 24 hours: Filed Vitals:   07/21/14 0345 07/21/14 0415 07/21/14 0430 07/21/14 0523  BP: 165/82 157/78 151/69 187/80  Pulse: 64 65 65 74  Temp:    98.4 F (36.9 C)  TempSrc:    Oral  Resp: 22 22 21 18   Height:    5\' 9"  (1.753 m)  Weight:    210 lb 14.4 oz (95.664 kg)  SpO2: 93% 90% 93% 95%   Weight change:   Intake/Output Summary (Last 24 hours) at 07/21/14 0840 Last data filed at 07/21/14 0751  Gross per 24 hour  Intake      0 ml  Output      0 ml  Net      0 ml   Vitals reviewed. General: resting in bed, NAD HEENT: PERRL, EOMI, no scleral icterus Cardiac: RRR, no rubs, murmurs or gallops Pulm: bibasilar crackles,  Abd: soft, nontender, nondistended, BS present Ext: warm and well perfused, no pedal edema Neuro: alert and oriented X3, cranial nerves II-XII grossly intact, strength and sensation to light touch equal in bilateral upper and lower extremities  Lab Results: Basic Metabolic Panel:  Recent Labs Lab 07/20/14 2344  NA 140  K 3.1*  CL 105  CO2 24  GLUCOSE 131*  BUN 9  CREATININE 0.81  CALCIUM 9.7   CBC:  Recent Labs Lab 07/20/14 2344  WBC 8.1  HGB 12.1  HCT 35.7*  MCV 84.4  PLT 205    Recent Labs Lab 07/21/14 0530 07/21/14 0753  GLUCAP 107* 131*   Hemoglobin A1C: No results for input(s): HGBA1C in the last 168 hours. Fasting Lipid Panel: No results for input(s): CHOL, HDL, LDLCALC, TRIG, CHOLHDL, LDLDIRECT in the last 168 hours. Misc. Labs:   Micro Results: No results found for this or any previous visit (from the past 240  hour(s)). Studies/Results: Dg Chest Port 1 View  07/20/2014   CLINICAL DATA:  Left-sided chest pain for 1 week. History of hypertension, diabetes, CHF.  EXAM: PORTABLE CHEST - 1 VIEW  COMPARISON:  03/23/2014  FINDINGS: Normal heart size and pulmonary vascularity. Tortuous aorta. Infiltration or atelectasis at the left lung base with similar appearance to previous study. This may represent chronic fibrosis. No blunting of costophrenic angles. No pneumothorax. Mediastinal contours appear intact. Prominence of the right hilum is similar to prior study and probably represents vascular prominence.  IMPRESSION: Infiltration or atelectasis in the left lung base is unchanged since prior study suggesting chronic process.   Electronically Signed   By: Lucienne Capers M.D.   On: 07/20/2014 23:35   Medications: I have reviewed the patient's current medications. Scheduled Meds: . amLODipine  5 mg Oral Daily  . aspirin  81 mg Oral Once  . atorvastatin  20 mg Oral q1800  . carvedilol  25 mg Oral BID WC  . heparin  5,000 Units Subcutaneous 3 times per day  . insulin aspart  0-9 Units Subcutaneous 6 times per day  . levothyroxine  100 mcg Oral QAC breakfast  . mometasone-formoterol  2 puff Inhalation BID  .  pantoprazole  40 mg Oral Daily  . PARoxetine  20 mg Oral q morning - 10a  . potassium chloride  40 mEq Oral BID  . sodium chloride  3 mL Intravenous Q12H  . tiotropium  18 mcg Inhalation Daily   Continuous Infusions: . 0.9 % NaCl with KCl 20 mEq / L 75 mL/hr at 07/21/14 0639   PRN Meds:.albuterol, HYDROcodone-acetaminophen, zolpidem Assessment/Plan: Principal Problem:   Chest pain Active Problems:   Hypothyroidism   Diabetes 1.5, managed as type 2   Hyperlipidemia   Depression   COPD (chronic obstructive pulmonary disease)   GASTROESOPHAGEAL REFLUX DISEASE   Chest pain with high risk for cardiac etiology  79 yo female with hx of DM II, HLD, HTN, here with chest pain in the setting of her  great grand daughter's death from brain tumor day of admission.  Chest pain -TIMI 3, with hx of HTN, DM, HLD. -  - likely 2/2 to anxiety from her great grand daughter's death on the day of admission when the chest pain got severe, but concerning for Unstable angina by pressure like pain with radiation to the arm, improvement with ntiro. Also could be from hypertension (EMS found her to have 220/116), or takusubo cardiomyopathy al though doesn't seem volume overloaded and no EKG changes. Last Cath 2010 normal, myoview 2010 mild anterior apical ischemia. Last ECHO 2010 EF 10%, grade 1 diastolic dysfunction. Pain improved after asa full and 1 nitro. - No EKG changes other than nonspecific T wave changes. trops negative so far. - consulted cardiology: rec outpatient stress test. - cont asa 81  Uncontrolled HTN - worsening could be 2/2 to stress - cont coreg, amlodipne 5mg  daily, restart lisinopril at higher dose (5 >>20mg ) to better control BP. - further titration outpatient  DM II - last hgbga1c 7.4 03/2014. On glipizide 5mg  qd + metformin 1000mg  bid - hgba1c pending. ssi here. Home regimen on discharge.  HLD - was on lipitor 20mg  daily before, last LDL 87 - appreciate card recs, change lipitor to 40mg  daily.  Hypokalemia - repleted.  GERD - on prilosec at home, protonix here.  COPD - cont home albuterol, advair, and spiriva  Depression - cont paxil 20mg  daily - consider further titration based on her mood outpatient. Hard to assess her depression in the setting of recent family member's death.  Insomnia - cont home ambien   Diet: heart healthy.  Dispo: d/c today.  The patient does have a current PCP Bertha Stakes, MD) and does need an [facility] hospital follow-up appointment after discharge.  The patient does have transportation limitations that hinder transportation to clinic appointments.  .Services Needed at time of discharge: Y = Yes, Blank = No PT:   OT:   RN:   Equipment:    Other:       Dellia Nims, MD 07/21/2014, 8:40 AM

## 2014-07-21 NOTE — Discharge Summary (Signed)
Name: Caitlyn Aguirre MRN: [id number] DOB: [birth year] 79 y.o. PCP: Bertha Stakes, MD  Date of Admission: 07/20/2014 10:44 PM Date of Discharge: 07/21/2014 Attending Physician: Madilyn Fireman, MD  Discharge Diagnosis:  Principal Problem:   Chest pain Active Problems:   Hypothyroidism   Diabetes 1.5, managed as type 2   Hyperlipidemia   Depression   COPD (chronic obstructive pulmonary disease)   GASTROESOPHAGEAL REFLUX DISEASE   Chest pain with high risk for cardiac etiology  Discharge Medications:   Medication List    STOP taking these medications        benzonatate 100 MG capsule  Commonly known as:  TESSALON     ondansetron 4 MG tablet  Commonly known as:  ZOFRAN     triamcinolone ointment 0.5 %  Commonly known as:  KENALOG      TAKE these medications        ACCU-CHEK FASTCLIX LANCETS Misc  Use to check blood sugar 2 times daily. diag code 250.00. Non-insulin dependent     albuterol 108 (90 BASE) MCG/ACT inhaler  Commonly known as:  PROAIR HFA  Inhale 2 puffs into the lungs every 6 (six) hours as needed for wheezing or shortness of breath.     amLODipine 5 MG tablet  Commonly known as:  NORVASC  TAKE 1 TABLET BY MOUTH EVERY DAY     aspirin EC 81 MG tablet  Take 1 tablet (81 mg total) by mouth daily.     atorvastatin 40 MG tablet  Commonly known as:  LIPITOR  Take 1 tablet (40 mg total) by mouth daily at 6 PM.     carvedilol 25 MG tablet  Commonly known as:  COREG  Take 1 tablet (25 mg total) by mouth 2 (two) times daily with a meal.     Fluticasone-Salmeterol 250-50 MCG/DOSE Aepb  Commonly known as:  ADVAIR DISKUS  Inhale 1 puff into the lungs 2 (two) times daily.     glipiZIDE 5 MG 24 hr tablet  Commonly known as:  GLIPIZIDE XL  Take 1 tablet (5 mg total) by mouth daily.     glucose blood test strip  Commonly known as:  ACCU-CHEK SMARTVIEW  Use as instructed     HYDROcodone-acetaminophen 5-325 MG per tablet  Commonly known as:  NORCO/VICODIN    Take 1 tablet by mouth 2 (two) times daily as needed for moderate pain.     levothyroxine 100 MCG tablet  Commonly known as:  SYNTHROID, LEVOTHROID  TAKE ONE TABLET BY MOUTH EVERY DAY     lisinopril 20 MG tablet  Commonly known as:  PRINIVIL,ZESTRIL  Take 1 tablet (20 mg total) by mouth daily.     metFORMIN 1000 MG tablet  Commonly known as:  GLUCOPHAGE  Take 1 tablet (1,000 mg total) by mouth 2 (two) times daily with a meal.     omeprazole 20 MG capsule  Commonly known as:  PRILOSEC  TAKE 1 CAPSULE BY MOUTH EVERY DAY     PARoxetine 20 MG tablet  Commonly known as:  PAXIL  Take 1 tablet (20 mg total) by mouth every morning.     senna-docusate 8.6-50 MG per tablet  Commonly known as:  Senokot-S  Take 1 tablet by mouth daily.     SPIRIVA HANDIHALER 18 MCG inhalation capsule  Generic drug:  tiotropium  INHALE THE CONTENTS OF 1 CAPSULE VIA HANDIHALER BY MOUTH EVERY DAY     zolpidem 5 MG tablet  Commonly known as:  AMBIEN  Take 1 tablet (5 mg total) by mouth at bedtime as needed for sleep.        Disposition and follow-up:   [name] was discharged from [facility] in Stable condition.  At the hospital follow up visit please address:  1.  Please assess her BP control. We increased lisinopril to 20mg  daily from 5. Consider further titration if needed.  Increased lipitor to 40mg  daily. Assess for any myalgias.   Needs outpatient stress test (likey 2 day long based on her weight per cards). They were supposed to schedule this, ask patient if they have called her about it.  2.  Labs / imaging needed at time of follow-up: BMET (increased lisinopril), also if hypokalemic may need daily PO K+.  3.  Pending labs/ test needing follow-up: hgba1c  Follow-up Appointments:   Discharge Instructions:   Consultations: Treatment Team:  Rounding Lbcardiology, MD  Procedures Performed:  Dg Chest Port 1 View  07/20/2014   CLINICAL DATA:  Left-sided  chest pain for 1 week. History of hypertension, diabetes, CHF.  EXAM: PORTABLE CHEST - 1 VIEW  COMPARISON:  03/23/2014  FINDINGS: Normal heart size and pulmonary vascularity. Tortuous aorta. Infiltration or atelectasis at the left lung base with similar appearance to previous study. This may represent chronic fibrosis. No blunting of costophrenic angles. No pneumothorax. Mediastinal contours appear intact. Prominence of the right hilum is similar to prior study and probably represents vascular prominence.  IMPRESSION: Infiltration or atelectasis in the left lung base is unchanged since prior study suggesting chronic process.   Electronically Signed   By: Lucienne Capers M.D.   On: 07/20/2014 23:35      Admission HPI:   Pt is a 79 y/o F w/ PMHx of hypothyroidism, DM, HLD, depression, HTN, diastolic HF (ECHO 2119 EF 55% w/ grade 1 diastolic dysfunction), ?hx of MI 10-15 yrs ago, GERD, and COPD who presents with chest pain. Pt has had intermittent chest pain for the past week that would occur 2-3 times per day and last for a few seconds each time. Pain was a pressure sensation over left chest wall that would make her want to grab her left chest. Pt has a sedentary lifestyle and states she was sitting down when these episodes of fleeting chest pain would occur. The day of admission pt was with her 24 y/o granddaughter this evening as she was dying from brain cancer when she developed chest pain that radiated to her left shoulder. Chest pain was a heavy pressure sensation located over her left lateral breast that she rated as 8/10 in severity. Pain was associated w/ dizziness, diaphoresis, HA, and SOB. It was not associated w/ n/v. Pt is not certain what she was doing when chest pain occurred, she believes she may have been walking. EMS gave pt asa 325 and 1 NG which helped relieve pain. Pt denies current chest pain but has sore feeling over left chest wall. She has a reported hx of a MI 10/15 years ago with the  same presenting symptoms. However per chart review she had a cardiac cath in 1999 (cardiology could not find records of this cath) and 2010 that showed normal coronaries. In 2010 pt had a myoview that showed mild anterior apical ischemia. Pt's blood pressure was noted to be elevated at 220/116 when EMS arrived which went down to 118/69 after she was given asa 325 and 1 NG. Pt was admitted for further chest pain workup.   Last admitted  on 2/19 for sepsis secondary to UTI.    Hospital Course by problem list:  79 yo female with hx of DM II, HLD, HTN, here with chest pain in the setting of her great grand daughter's death from brain tumor day of admission.  Chest pain -TIMI 3, with hx of HTN, DM, HLD - likely 2/2 to anxiety from her great grand daughter's death on the day of admission when the chest pain got severe, but concerning for Unstable angina by pressure like pain with radiation to the arm, improvement with ntiro. Also could be from hypertension (EMS found her to have 220/116), or takusubo cardiomyopathy al though no EKG changes.  Last Cath 2010 normal, myoview 2010 mild anterior apical ischemia. Last ECHO 2010 EF 98%, grade 1 diastolic dysfunction. Pain improved after asa full and 1 nitro. - No EKG changes other than nonspecific T wave changes. trops negativex3.  - consulted cardiology: rec outpatient stress test. This will be arranged by cards.  - asa 81mg  daily  Uncontrolled HTN - worsening could be 2/2 to stress - cont coreg, amlodipne 5mg  daily, restart lisinopril at higher dose (5 >>20mg ) to better control BP. - further titration outpatient  DM II - last hgbga1c 7.4 03/2014. On glipizide 5mg  qd + metformin 1000mg  bid - hgba1c pending. ssi here. Home regimen on discharge.  HLD - was on lipitor 20mg  daily before, last LDL 87 - appreciate card recs, changed lipitor to 40mg  daily.  Hypokalemia - repleted. May consider daily po K+ if hypokalemic on follow up bmet.  GERD - on prilosec  at home, protonix here.  COPD - cont home albuterol, advair, and spiriva  Depression - cont paxil 20mg  daily - consider further titration based on her mood outpatient. Hard to assess her depression in the setting of recent family member's death.  Insomnia - cont home ambien  Discharge Vitals:   BP 187/80 mmHg  Pulse 74  Temp(Src) 98.4 F (36.9 C) (Oral)  Resp 18  Ht 5\' 9"  (1.753 m)  Wt 210 lb 14.4 oz (95.664 kg)  BMI 31.13 kg/m2  SpO2 96%  Discharge Labs:  Results for orders placed or performed during the hospital encounter of 07/20/14 (from the past 24 hour(s))  CBC     Status: Abnormal   Collection Time: 07/20/14 11:44 PM  Result Value Ref Range   WBC 8.1 4.0 - 10.5 K/uL   RBC 4.23 3.87 - 5.11 MIL/uL   Hemoglobin 12.1 12.0 - 15.0 g/dL   HCT 35.7 (L) 36.0 - 46.0 %   MCV 84.4 78.0 - 100.0 fL   MCH 28.6 26.0 - 34.0 pg   MCHC 33.9 30.0 - 36.0 g/dL   RDW 14.9 11.5 - 15.5 %   Platelets 205 150 - 400 K/uL  Basic metabolic panel     Status: Abnormal   Collection Time: 07/20/14 11:44 PM  Result Value Ref Range   Sodium 140 135 - 145 mmol/L   Potassium 3.1 (L) 3.5 - 5.1 mmol/L   Chloride 105 101 - 111 mmol/L   CO2 24 22 - 32 mmol/L   Glucose, Bld 131 (H) 65 - 99 mg/dL   BUN 9 6 - 20 mg/dL   Creatinine, Ser 0.81 0.44 - 1.00 mg/dL   Calcium 9.7 8.9 - 10.3 mg/dL   GFR calc non Af Amer >60 >60 mL/min   GFR calc Af Amer >60 >60 mL/min   Anion gap 11 5 - 15  BNP (order ONLY if patient complains of  dyspnea/SOB AND you have documented it for THIS visit)     Status: None   Collection Time: 07/20/14 11:44 PM  Result Value Ref Range   B Natriuretic Peptide 17.8 0.0 - 100.0 pg/mL  I-stat troponin, ED  (not at [facility], [facility])     Status: None   Collection Time: 07/20/14 11:51 PM  Result Value Ref Range   Troponin i, poc 0.01 0.00 - 0.08 ng/mL   Comment 3          Glucose, capillary     Status: Abnormal   Collection Time: 07/21/14  5:30 AM  Result Value Ref Range   Glucose-Capillary  107 (H) 65 - 99 mg/dL  Troponin I     Status: None   Collection Time: 07/21/14  7:38 AM  Result Value Ref Range   Troponin I <0.03 <0.031 ng/mL  Magnesium     Status: Abnormal   Collection Time: 07/21/14  7:38 AM  Result Value Ref Range   Magnesium 1.4 (L) 1.7 - 2.4 mg/dL  Glucose, capillary     Status: Abnormal   Collection Time: 07/21/14  7:53 AM  Result Value Ref Range   Glucose-Capillary 131 (H) 65 - 99 mg/dL   Comment 1 Notify RN     Signed: Dellia Nims, MD 07/21/2014, 11:47 AM    Services Ordered on Discharge:  Equipment Ordered on Discharge:

## 2014-07-21 NOTE — Progress Notes (Signed)
Admitted pt to rm 3E03 from ED, oriented to room, call bell placed within reach, pt denied chest pain at this time, admission assessment done, orders carried out. Family at bedside. Will continue to monitor.
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
## 2014-07-21 NOTE — Discharge Instructions (Signed)
You should come back to the hospital if you are having chest pain again.  Take the medication as we instructed.  Follow up at the clinic. We will call you to schedule an appointment.  You will need outpatient stress test at the cardiology clinic.
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
## 2014-07-23 ENCOUNTER — Other Ambulatory Visit: Payer: Self-pay | Admitting: *Deleted

## 2014-07-23 DIAGNOSIS — R079 Chest pain, unspecified: Secondary | ICD-10-CM

## 2014-07-23 LAB — HEMOGLOBIN A1C
Hgb A1c MFr Bld: 7 % — ABNORMAL HIGH (ref 4.8–5.6)
Mean Plasma Glucose: 154 mg/dL

## 2014-08-08 ENCOUNTER — Telehealth (HOSPITAL_COMMUNITY): Payer: Self-pay | Admitting: *Deleted

## 2014-08-08 NOTE — Telephone Encounter (Signed)
Patient called to give instructions on upcoming test and patient states she wants to cancel and will call back to reschedule. Hubbard Robinson, RN

## 2014-08-10 ENCOUNTER — Other Ambulatory Visit: Payer: Self-pay | Admitting: Internal Medicine

## 2014-08-10 ENCOUNTER — Encounter (HOSPITAL_COMMUNITY): Payer: Medicare Other

## 2014-08-10 MED ORDER — OMEPRAZOLE 20 MG PO CPDR: DELAYED_RELEASE_CAPSULE | ORAL | Status: DC

## 2014-08-10 MED ORDER — ZOLPIDEM TARTRATE 5 MG PO TABS: 5.0000 mg | ORAL_TABLET | Freq: Every evening | ORAL | Status: DC | PRN

## 2014-08-10 NOTE — Telephone Encounter (Signed)
Request sent to MD

## 2014-08-10 NOTE — Telephone Encounter (Signed)
Needs appt [facility] res next 30 days. Had been in hospital June and BP meds changed but no F/U appt. Also, PPI not address since May 2015 and Ambien need never addressed.

## 2014-08-10 NOTE — Telephone Encounter (Signed)
Mail in pharmacy.

## 2014-08-10 NOTE — Telephone Encounter (Signed)
Message sent to front desk.  Med called in

## 2014-08-14 ENCOUNTER — Ambulatory Visit (INDEPENDENT_AMBULATORY_CARE_PROVIDER_SITE_OTHER): Payer: Medicare Other | Admitting: Internal Medicine

## 2014-08-14 ENCOUNTER — Encounter: Payer: Self-pay | Admitting: Internal Medicine

## 2014-08-14 VITALS — BP 153/70 | HR 96 | Temp 98.8°F | Ht 68.0 in | Wt 209.0 lb

## 2014-08-14 DIAGNOSIS — E876 Hypokalemia: Secondary | ICD-10-CM | POA: Diagnosis not present

## 2014-08-14 DIAGNOSIS — F4321 Adjustment disorder with depressed mood: Secondary | ICD-10-CM | POA: Diagnosis not present

## 2014-08-14 DIAGNOSIS — M1710 Unilateral primary osteoarthritis, unspecified knee: Secondary | ICD-10-CM

## 2014-08-14 DIAGNOSIS — E039 Hypothyroidism, unspecified: Secondary | ICD-10-CM | POA: Diagnosis not present

## 2014-08-14 DIAGNOSIS — F329 Major depressive disorder, single episode, unspecified: Secondary | ICD-10-CM | POA: Diagnosis not present

## 2014-08-14 DIAGNOSIS — R0789 Other chest pain: Secondary | ICD-10-CM | POA: Diagnosis not present

## 2014-08-14 DIAGNOSIS — F32A Depression, unspecified: Secondary | ICD-10-CM

## 2014-08-14 DIAGNOSIS — R079 Chest pain, unspecified: Secondary | ICD-10-CM

## 2014-08-14 DIAGNOSIS — I10 Essential (primary) hypertension: Secondary | ICD-10-CM

## 2014-08-14 DIAGNOSIS — M179 Osteoarthritis of knee, unspecified: Secondary | ICD-10-CM

## 2014-08-14 DIAGNOSIS — E139 Other specified diabetes mellitus without complications: Secondary | ICD-10-CM

## 2014-08-14 LAB — BASIC METABOLIC PANEL WITH GFR
BUN: 12 mg/dL (ref 6–23)
CO2: 23 mEq/L (ref 19–32)
Calcium: 10.3 mg/dL (ref 8.4–10.5)
Chloride: 108 mEq/L (ref 96–112)
Creat: 0.72 mg/dL (ref 0.50–1.10)
GFR, Est African American: >89 mL/min
GFR, Est Non African American: 81 mL/min
Glucose, Bld: 92 mg/dL (ref 70–99)
Potassium: 4.3 mEq/L (ref 3.5–5.3)
Sodium: 145 mEq/L (ref 135–145)

## 2014-08-14 LAB — TSH: TSH: 0.014 u[IU]/mL — ABNORMAL LOW (ref 0.350–4.500)

## 2014-08-14 LAB — GLUCOSE, CAPILLARY: Glucose-Capillary: 169 mg/dL — ABNORMAL HIGH (ref 65–99)

## 2014-08-14 LAB — MAGNESIUM: Magnesium: 1.6 mg/dL (ref 1.5–2.5)

## 2014-08-14 MED ORDER — PAROXETINE HCL 30 MG PO TABS: 30.0000 mg | ORAL_TABLET | Freq: Every day | ORAL | Status: DC

## 2014-08-14 MED ORDER — DICLOFENAC SODIUM 1 % TD GEL: 2.0000 g | Freq: Four times a day (QID) | TRANSDERMAL | Status: DC

## 2014-08-14 NOTE — Assessment & Plan Note (Addendum)
BP Readings from Last 3 Encounters:  08/14/14 153/70  07/21/14 187/80  05/04/14 181/87    Lab Results  Component Value Date   NA 140 07/20/2014   K 3.1* 07/20/2014   CREATININE 0.81 07/20/2014    Assessment: Blood pressure control:  moderately elevated Progress toward BP goal:   improving Comments: compliant with medication and brought med bottles.  Lisinopril was increased from 5mg  to 20mg  during admission.   Plan: Medications:  continue current medications:  Lisinopril 20mg , amlodipine 5mg  daily, Coreg 25mg  BID (she says she has been taking it BID since she was instructed to last visit). Educational resources provided: brochure (denies) Self management tools provided:   Other plans: BMP today, RTC in 1 month

## 2014-08-14 NOTE — Patient Instructions (Signed)
1. I have increased your depression medications, paroxetine.  Please take 30mg  paroxetine daily.  Please be sure to contact Hospice to arrange grief counseling for yourself and family.  Come back to see your primary care doctor in 1 month.  2. Please take all medications as prescribed.    3. If you have worsening of your symptoms or new symptoms arise, please call the clinic ([phone]), or go to the ER immediately if symptoms are severe.
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
## 2014-08-15 ENCOUNTER — Other Ambulatory Visit: Payer: Self-pay | Admitting: Internal Medicine

## 2014-08-15 DIAGNOSIS — F4321 Adjustment disorder with depressed mood: Secondary | ICD-10-CM | POA: Insufficient documentation

## 2014-08-15 LAB — T3, FREE: T3, Free: 3.4 pg/mL (ref 2.3–4.2)

## 2014-08-15 LAB — T4, FREE: Free T4: 1.4 ng/dL (ref 0.80–1.80)

## 2014-08-15 NOTE — Assessment & Plan Note (Signed)
Much of the visit focused on her feelings since losing her great grandchild 1 month ago.  She feels depression worse in past few months and since loss.  Fatigue and poor sleep predominate.  For her acute grief, she was provided with information pamphlet and phone number for [facility].  They provide free grief counseling.  She was thankful for this information and says she plans to call when she gets home and provide the info to her family members.  Celexa was increased to 30mg  daily for depression/anxiety symptoms.

## 2014-08-15 NOTE — Assessment & Plan Note (Addendum)
Compliant with Synthroid.  She feels fatigued but this is likely due to her acute grief and depression. - check TSH - suppressed; will add free T4 and free T3 to check magnitude and make appropriate Synthroid adjustment

## 2014-08-15 NOTE — Assessment & Plan Note (Addendum)
She developed chest pain on the same day her great grand-daughter passed away.  She was admitted overnight, ruled out for ACS but cardiology recommended outpatient stress test.  The patient said this had been arranged but she had to cancel it due to schedule conflict.  She has not had recurrence of chest pain since discharge.  She asked for the phone number so she could reschedule. - phone number for [facility] given to patient - I will also ask [facility] to contact the patient. - continue ASA, statin, DM and HTN control

## 2014-08-15 NOTE — Assessment & Plan Note (Addendum)
She feels her depression was worsening prior to her grand-daughter's death.  She says Celexa worked well in the past and she has continued to take it (she has all of her pill bottles with her) but she feels she needs higher dose.  Her mood and affect are appropriate to the situation, she is appropriately dressed and groomed, denies anhedonia.  She is not sleeping well, feels tired when she awakes, like it takes extra energy to do things.  Appetite not great.  Symptoms worse since loss of her great grandchild.  Given description of feeling the Celexa was not working as well (specifically said she felt she needed a higher dose) and increasing depression in recent months I think she warrants increase in Celexa.  For her acute grief, she was provided with information pamphlet and phone number for [facility].  They provide free grief counseling.  She was thankful for this information and says she plans to call when she gets home and provide the info to her family members. - INCREASE Celexa from 20mg  daily to 30mg  daily - [facility] Grief counseling pamphlet and phone number provided - RTC in 1 month for follow-up

## 2014-08-17 ENCOUNTER — Other Ambulatory Visit: Payer: Self-pay | Admitting: Internal Medicine

## 2014-08-17 MED ORDER — LEVOTHYROXINE SODIUM 88 MCG PO TABS: 88.0000 ug | ORAL_TABLET | Freq: Every day | ORAL | Status: DC

## 2014-08-20 NOTE — Progress Notes (Signed)
Internal Medicine Clinic Attending  Case discussed with Dr. Wilson soon after the resident saw the patient.  We reviewed the resident's history and exam and pertinent patient test results.  I agree with the assessment, diagnosis, and plan of care documented in the resident's note.  

## 2014-08-21 ENCOUNTER — Telehealth: Payer: Self-pay | Admitting: *Deleted

## 2014-08-21 NOTE — Telephone Encounter (Signed)
Call to Cardiology to see if patient has rescheduled her StressTest.  Patient had cancelled previous appointment and stated that she would call to reschedule.  Office said that they do not call patient to reschedule.  Patient will need to call them.  Call to patient's home # spoke with female there who said patient was not there.  Asked to have patient to call the Clinics when she returns and to ask for [facility].  Call to patient's Cell .  Message again left to call the Clinics and to ask for [facility].  Sander Nephew, RN 08/21/2014 9:28 AM.

## 2014-09-11 ENCOUNTER — Other Ambulatory Visit: Payer: Self-pay | Admitting: Student in an Organized Health Care Education/Training Program

## 2014-09-11 DIAGNOSIS — M8949 Other hypertrophic osteoarthropathy, multiple sites: Secondary | ICD-10-CM

## 2014-09-11 DIAGNOSIS — M150 Primary generalized (osteo)arthritis: Principal | ICD-10-CM

## 2014-09-11 DIAGNOSIS — M159 Polyosteoarthritis, unspecified: Secondary | ICD-10-CM

## 2014-09-11 NOTE — Telephone Encounter (Signed)
Pt is not on Percocet, will call pharmacy in AM

## 2014-09-11 NOTE — Telephone Encounter (Signed)
Pharmacy calling asking for a rx for percocet for patient

## 2014-09-12 NOTE — Telephone Encounter (Signed)
Pt was asking for Percocet.  I don't see where we ever prescribed it. Last refill on Norco 07/05/14

## 2014-09-13 MED ORDER — HYDROCODONE-ACETAMINOPHEN 5-325 MG PO TABS: 1.0000 | ORAL_TABLET | Freq: Two times a day (BID) | ORAL | Status: DC | PRN

## 2014-09-13 NOTE — Telephone Encounter (Signed)
Using chronic narcotics for osteoarthritis related pain. Record review shows she gets Norco #60 every two months. Review of controlled substance database shows appropriate refills over the last one year. Please ask the patient to make an appointment with me to receive her next refill. We will need a urine toxicology screen at that time.

## 2014-09-13 NOTE — Telephone Encounter (Signed)
Pt informed Rx is ready and she will make appointment at that time.

## 2014-09-17 ENCOUNTER — Ambulatory Visit (INDEPENDENT_AMBULATORY_CARE_PROVIDER_SITE_OTHER): Payer: Medicare Other | Admitting: Student in an Organized Health Care Education/Training Program

## 2014-09-17 ENCOUNTER — Encounter: Payer: Self-pay | Admitting: Student in an Organized Health Care Education/Training Program

## 2014-09-17 VITALS — BP 154/64 | HR 66 | Temp 98.2°F | Ht 68.0 in | Wt 214.9 lb

## 2014-09-17 DIAGNOSIS — J449 Chronic obstructive pulmonary disease, unspecified: Secondary | ICD-10-CM

## 2014-09-17 DIAGNOSIS — I10 Essential (primary) hypertension: Secondary | ICD-10-CM

## 2014-09-17 DIAGNOSIS — M159 Polyosteoarthritis, unspecified: Secondary | ICD-10-CM

## 2014-09-17 DIAGNOSIS — M25561 Pain in right knee: Secondary | ICD-10-CM | POA: Diagnosis not present

## 2014-09-17 DIAGNOSIS — J438 Other emphysema: Secondary | ICD-10-CM

## 2014-09-17 DIAGNOSIS — I2510 Atherosclerotic heart disease of native coronary artery without angina pectoris: Secondary | ICD-10-CM

## 2014-09-17 DIAGNOSIS — Z0389 Encounter for observation for other suspected diseases and conditions ruled out: Secondary | ICD-10-CM

## 2014-09-17 DIAGNOSIS — M79641 Pain in right hand: Secondary | ICD-10-CM

## 2014-09-17 DIAGNOSIS — L309 Dermatitis, unspecified: Secondary | ICD-10-CM

## 2014-09-17 DIAGNOSIS — M25562 Pain in left knee: Secondary | ICD-10-CM | POA: Diagnosis not present

## 2014-09-17 DIAGNOSIS — M8949 Other hypertrophic osteoarthropathy, multiple sites: Secondary | ICD-10-CM

## 2014-09-17 DIAGNOSIS — M79642 Pain in left hand: Secondary | ICD-10-CM

## 2014-09-17 DIAGNOSIS — IMO0001 Reserved for inherently not codable concepts without codable children: Secondary | ICD-10-CM | POA: Insufficient documentation

## 2014-09-17 DIAGNOSIS — M150 Primary generalized (osteo)arthritis: Principal | ICD-10-CM

## 2014-09-17 DIAGNOSIS — G4733 Obstructive sleep apnea (adult) (pediatric): Secondary | ICD-10-CM

## 2014-09-17 DIAGNOSIS — E119 Type 2 diabetes mellitus without complications: Secondary | ICD-10-CM

## 2014-09-17 MED ORDER — TRIAMCINOLONE ACETONIDE 0.1 % EX OINT: 1.0000 "application " | TOPICAL_OINTMENT | Freq: Every day | CUTANEOUS | Status: DC | PRN

## 2014-09-17 MED ORDER — FLUTICASONE-SALMETEROL 250-50 MCG/DOSE IN AEPB: 1.0000 | INHALATION_SPRAY | Freq: Two times a day (BID) | RESPIRATORY_TRACT | Status: DC

## 2014-09-17 MED ORDER — HYDROCODONE-ACETAMINOPHEN 5-325 MG PO TABS: 1.0000 | ORAL_TABLET | Freq: Two times a day (BID) | ORAL | Status: DC | PRN

## 2014-09-17 NOTE — Assessment & Plan Note (Signed)
Symptomatically stable but she is still using her albuterol rescue inhaler too frequently. She is on a long-acting anticholinergic, but has not been able to use the combination steroid and LABA for several months. We talked about appropriate use of these inhalers and will have a goal of reducing the amount she is needing to use the rescue inhaler. She has descents exertional capacity, can walk about 2 city blocks before feeling short of breath. - Refilled Advair Diskus 250-50 bid - Continuing to tiopropium once daily - Trying to reduce the amount she needs albuterol

## 2014-09-17 NOTE — Assessment & Plan Note (Signed)
A1c is under great control at 7%. I think this is at goal for her; given her age and her A1c goals are between 7 and 8%. Recently stopped glipizide which I think is reasonable given risk of hypoglycemia. Recheck an A1c in the late September to see if she needs additional glycemic control.

## 2014-09-17 NOTE — Assessment & Plan Note (Signed)
Very mild inflammatory changes of both of her hands that it been previously benefited with topical steroid. - Refill triamcinolone 0.1% ointment to be used as needed

## 2014-09-17 NOTE — Progress Notes (Signed)
See Encounters tab for problem-based medical decision making  __________________________________________________________  HPI:  Patient is a 79 year old woman with coronary artery disease, hypertension, and diabetes who comes in today for follow-up of her sleep disturbance. She's been struggling for several months with acute grief after her granddaughter died of a brain tumor. At her last visit proxetil was increased from 20 mg to 30 mg. she reports that her mood is mildly improved. She currently lives with her daughter and still has a lot of family around. She feels like her anxiety and depression are stable. Continue to have difficulty with sleep. Reports this has been going on for many months, even before her granddaughter passed away. Says she goes to bed and lies awake with anxious thoughts racing through her head. Has difficulty initiating sleep and often has early awakenings. Denies daytime fatigue or somnolence. Does not take daytime naps. Drinks caffeine in the morning and occasionally caffeine in the evening in the form of tea or soda. Watches TV in the evening with her family and usually goes to bed around midnight. Previously diagnosed with obstructive sleep apnea on sleep study but has not yet initiated CPAP because of cost. Denies any daytime headaches. She'll force that she had benefit in the past with Ambien and is wondering if that could be appropriate for her again. She says that it's been at least one year since she last fell. Currently she is using Tylenol PM for sleep aid.  Hospitalized in June with atypical chest pain. Dilated by cardiology at that time and thoughts low risk for active ischemic disease. Since she was discharged in been doing well at home. Currently has moderately good exertional capacity, can walk about 2 city blocks before feeling short of breath. Denies any chest pain with exertion or at rest at this time. Tolerating her medications well with no side effects. She  uses her albuterol inhaler when she feels short of breath. Denies any significant wheezing or coughing. Says she uses her albuterol at least twice daily. Currently out of her Advair Diskus for last several months. Now she is only using Spiriva and frequent albuterol. Still abstaining from tobacco use. No fevers or chills. Eating and drinking well.  Chills has chronic mild to moderate pain in both of her knees and hands due most likely to osteoarthritis. Reports some good improvements with the use of hydrocodone. Usually uses about one tab in the morning. This helps her accomplish her chores of the day. She is still very [place], completes all of her activities of daily living independently. Denies any side effects from the hydrocodone at this point.  __________________________________________________________  Problem List: Patient Active Problem List   Diagnosis Date Noted  . CAD (coronary artery disease) 09/17/2014  . Grief 08/15/2014  . Eczema of hand 06/21/2013  . Preventive measure 02/17/2013  . Obstructive sleep apnea 12/25/2011  . Hypothyroidism 01/21/2006  . Depression 01/21/2006  . Essential hypertension 01/21/2006  . Chronic diastolic heart failure 03/50/0938  . COPD (chronic obstructive pulmonary disease) 01/21/2006  . GASTROESOPHAGEAL REFLUX DISEASE 01/21/2006  . Osteoarthritis 01/21/2006  . Diabetes mellitus 10/04/1998    Medications: Reconciled today in Epic __________________________________________________________  Physical Exam:  Vital Signs: Filed Vitals:   09/17/14 0827  BP: 154/64  Pulse: 66  Temp: 98.2 F (36.8 C)  TempSrc: Oral  Height: 5\' 8"  (1.727 m)  Weight: 214 lb 14.4 oz (97.478 kg)  SpO2: 99%    Gen: Well appearing, NAD ENT: OP clear without erythema or  exudate, top dentures in place.  Neck: No cervical LAD, No thyromegaly or nodules, No JVD. CV: RRR, no murmurs Pulm: Normal effort, CTA throughout, no wheezing Abd: Soft, NT, ND.  Ext:  Warm, no edema, knees with moderate crepitus Skin: No atypical appearing moles. No rashes

## 2014-09-17 NOTE — Assessment & Plan Note (Signed)
Quality of sleep continues to be poor, likely due to poor sleep hygiene, untreated sleep apnea, and acute grief state. Given her age, untreated apnea, and underlying COPD, I don't think she is a good candidate for zolpidem or other sleep aids. I think she is too high a fall risk for these medications. Instead we talked about improving her sleep hygiene, reducing television time in the evening, no caffeine in the afternoon or evening, a more stable schedule, and continuing to treat her acute grief. We need to address eating a sleep apnea machine for her at our next visit.

## 2014-09-17 NOTE — Patient Instructions (Signed)
Insomnia Insomnia is frequent trouble falling and/or staying asleep. Insomnia can be a long term problem or a short term problem. Both are common. Insomnia can be a short term problem when the wakefulness is related to a certain stress or worry. Long term insomnia is often related to ongoing stress during waking hours and/or poor sleeping habits. Overtime, sleep deprivation itself can make the problem worse. Every little thing feels more severe because you are overtired and your ability to cope is decreased. CAUSES   Stress, anxiety, and depression.  Poor sleeping habits.  Distractions such as TV in the bedroom.  Naps close to bedtime.  Engaging in emotionally charged conversations before bed.  Technical reading before sleep.  Alcohol and other sedatives. They may make the problem worse. They can hurt normal sleep patterns and normal dream activity.  Stimulants such as caffeine for several hours prior to bedtime.  Pain syndromes and shortness of breath can cause insomnia.  Exercise late at night.  Changing time zones may cause sleeping problems (jet lag). It is sometimes helpful to have someone observe your sleeping patterns. They should look for periods of not breathing during the night (sleep apnea). They should also look to see how long those periods last. If you live alone or observers are uncertain, you can also be observed at a sleep clinic where your sleep patterns will be professionally monitored. Sleep apnea requires a checkup and treatment. Give your caregivers your medical history. Give your caregivers observations your family has made about your sleep.  SYMPTOMS   Not feeling rested in the morning.  Anxiety and restlessness at bedtime.  Difficulty falling and staying asleep. TREATMENT   Your caregiver may prescribe treatment for an underlying medical disorders. Your caregiver can give advice or help if you are using alcohol or other drugs for self-medication. Treatment  of underlying problems will usually eliminate insomnia problems.  Medications can be prescribed for short time use. They are generally not recommended for lengthy use.  Over-the-counter sleep medicines are not recommended for lengthy use. They can be habit forming.  You can promote easier sleeping by making lifestyle changes such as:  Using relaxation techniques that help with breathing and reduce muscle tension.  Exercising earlier in the day.  Changing your diet and the time of your last meal. No night time snacks.  Establish a regular time to go to bed.  Counseling can help with stressful problems and worry.  Soothing music and white noise may be helpful if there are background noises you cannot remove.  Stop tedious detailed work at least one hour before bedtime. HOME CARE INSTRUCTIONS   Keep a diary. Inform your caregiver about your progress. This includes any medication side effects. See your caregiver regularly. Take note of:  Times when you are asleep.  Times when you are awake during the night.  The quality of your sleep.  How you feel the next day. This information will help your caregiver care for you.  Get out of bed if you are still awake after 15 minutes. Read or do some quiet activity. Keep the lights down. Wait until you feel sleepy and go back to bed.  Keep regular sleeping and waking hours. Avoid naps.  Exercise regularly.  Avoid distractions at bedtime. Distractions include watching television or engaging in any intense or detailed activity like attempting to balance the household checkbook.  Develop a bedtime ritual. Keep a familiar routine of bathing, brushing your teeth, climbing into bed at the same   time each night, listening to soothing music. Routines increase the success of falling to sleep faster.  Use relaxation techniques. This can be using breathing and muscle tension release routines. It can also include visualizing peaceful scenes. You can  also help control troubling or intruding thoughts by keeping your mind occupied with boring or repetitive thoughts like the old concept of counting sheep. You can make it more creative like imagining planting one beautiful flower after another in your backyard garden.  During your day, work to eliminate stress. When this is not possible use some of the previous suggestions to help reduce the anxiety that accompanies stressful situations. MAKE SURE YOU:   Understand these instructions.  Will watch your condition.  Will get help right away if you are not doing well or get worse. Document Released: 01/17/2000 Document Revised: 04/13/2011 Document Reviewed: 02/16/2007 [employer] Patient Information 2015 [employer]. This information is not intended to replace advice given to you by your health care provider. Make sure you discuss any questions you have with your health care provider.  

## 2014-09-17 NOTE — Assessment & Plan Note (Signed)
Blood pressure elevated today because she has not taken her antihypertensives this morning. We're to continue the current regimen, but her back in one month for blood pressure recheck. Advised her to take these medicines every morning even if she's coming to the doctor. - Current regimen includes amlodipine 5 mg, carvedilol 25 twice a day, and lisinopril 20 mg daily

## 2014-09-17 NOTE — Assessment & Plan Note (Signed)
Symptomatically stable but she continues to have pain in her knees and hands, that is consistent with osteoarthritis changes. She has some benefit from as needed hydrocodone, and has been improving her exertional capacity. No signs of dependence or narcotic misuse at this time. We'll continue to provide her hashed had 60 tablets which will last around 45 days. - Refilled Norco #60 to be filled after September 1st - UDS at next visit

## 2014-09-17 NOTE — Assessment & Plan Note (Signed)
Prior acute MI about 1999 which resulted in catheterization and I think a stent placement. Recent hospitalization for atypical chest pain which has now resolved. No exertional angina over the last several months. Patient is planning to follow up for up patient nuclear stress test. We'll continue her current medical regimen for secondary prevention. - Aspirin 81 mg daily, atorvastatin 40 mg daily, carvedilol 25 mg twice a day and lisinopril 20 mg daily - Diabetes is well-controlled with A1c at 7%.

## 2014-10-09 ENCOUNTER — Other Ambulatory Visit: Payer: Self-pay | Admitting: Student in an Organized Health Care Education/Training Program

## 2014-10-09 DIAGNOSIS — F329 Major depressive disorder, single episode, unspecified: Secondary | ICD-10-CM

## 2014-10-09 DIAGNOSIS — F32A Depression, unspecified: Secondary | ICD-10-CM

## 2014-10-09 MED ORDER — PAROXETINE HCL 30 MG PO TABS: 30.0000 mg | ORAL_TABLET | Freq: Every day | ORAL | Status: DC

## 2014-10-09 NOTE — Telephone Encounter (Signed)
Needs refill on paxil  °

## 2014-10-09 NOTE — Telephone Encounter (Signed)
This is not a patient at [facility].  Derl Barrow, RN

## 2014-10-09 NOTE — Addendum Note (Signed)
Addended by: Lalla Brothers T on: 10/09/2014 10:39 AM   Modules accepted: Orders

## 2014-10-10 ENCOUNTER — Other Ambulatory Visit: Payer: Self-pay | Admitting: Internal Medicine

## 2014-10-10 ENCOUNTER — Other Ambulatory Visit: Payer: Self-pay | Admitting: Student in an Organized Health Care Education/Training Program

## 2014-10-11 ENCOUNTER — Other Ambulatory Visit: Payer: Self-pay | Admitting: *Deleted

## 2014-10-24 ENCOUNTER — Ambulatory Visit (INDEPENDENT_AMBULATORY_CARE_PROVIDER_SITE_OTHER): Payer: Medicare Other | Admitting: Cardiology

## 2014-10-24 ENCOUNTER — Encounter: Payer: Self-pay | Admitting: Cardiology

## 2014-10-24 VITALS — BP 134/86 | HR 65 | Ht 68.0 in | Wt 209.1 lb

## 2014-10-24 DIAGNOSIS — I25119 Atherosclerotic heart disease of native coronary artery with unspecified angina pectoris: Secondary | ICD-10-CM

## 2014-10-24 DIAGNOSIS — E785 Hyperlipidemia, unspecified: Secondary | ICD-10-CM | POA: Diagnosis not present

## 2014-10-24 DIAGNOSIS — E669 Obesity, unspecified: Secondary | ICD-10-CM

## 2014-10-24 DIAGNOSIS — F329 Major depressive disorder, single episode, unspecified: Secondary | ICD-10-CM

## 2014-10-24 DIAGNOSIS — F32A Depression, unspecified: Secondary | ICD-10-CM

## 2014-10-24 NOTE — Patient Instructions (Addendum)
Medication Instructions:  Your physician recommends that you continue on your current medications as directed. Please refer to the Current Medication list given to you today.  Labwork: NONE  Testing/Procedures: Your physician has requested that you have a lexiscan myoview. For further information please visit [email or web address]. Please follow instruction sheet, as given.   Follow-Up: Your physician recommends that you schedule a follow-up appointment as needed with Dr. Marlou Porch.
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
## 2014-10-29 ENCOUNTER — Telehealth (HOSPITAL_COMMUNITY): Payer: Self-pay | Admitting: *Deleted

## 2014-10-29 NOTE — Telephone Encounter (Signed)
Left message on voicemail in reference to upcoming appointment scheduled for 10/31/14. Phone number given for a call back so details instructions can be given. Hasspacher, Ranae Palms

## 2014-10-30 ENCOUNTER — Telehealth (HOSPITAL_COMMUNITY): Payer: Self-pay | Admitting: *Deleted

## 2014-10-30 NOTE — Telephone Encounter (Signed)
Patient given detailed instructions per Myocardial Perfusion Study Information Sheet for test on 10/31/14 at 0915. Patient notified to arrive 15 minutes early and that it is imperative to arrive on time for appointment to keep from having the test rescheduled.  If you need to cancel or reschedule your appointment, please call the office within 24 hours of your appointment. Failure to do so may result in a cancellation of your appointment, and a $50 no show fee. Patient verbalized understanding. Mazi Schuff, Ranae Palms

## 2014-10-31 ENCOUNTER — Ambulatory Visit (HOSPITAL_COMMUNITY): Payer: Medicare Other | Attending: Cardiology

## 2014-10-31 DIAGNOSIS — E119 Type 2 diabetes mellitus without complications: Secondary | ICD-10-CM | POA: Diagnosis not present

## 2014-10-31 DIAGNOSIS — R079 Chest pain, unspecified: Secondary | ICD-10-CM | POA: Diagnosis not present

## 2014-10-31 DIAGNOSIS — R9439 Abnormal result of other cardiovascular function study: Secondary | ICD-10-CM | POA: Insufficient documentation

## 2014-10-31 DIAGNOSIS — I25119 Atherosclerotic heart disease of native coronary artery with unspecified angina pectoris: Secondary | ICD-10-CM | POA: Diagnosis not present

## 2014-10-31 DIAGNOSIS — I10 Essential (primary) hypertension: Secondary | ICD-10-CM | POA: Insufficient documentation

## 2014-10-31 LAB — MYOCARDIAL PERFUSION IMAGING
LV dias vol: 132 mL
LV sys vol: 59 mL
Peak HR: 72 {beats}/min
RATE: 0.31
Rest HR: 59 {beats}/min
SDS: 2
SRS: 2
SSS: 4
TID: 0.93

## 2014-10-31 MED ORDER — TECHNETIUM TC 99M SESTAMIBI GENERIC - CARDIOLITE
30.6000 | Freq: Once | INTRAVENOUS | Status: AC | PRN
  Administered 2014-10-31: 31 via INTRAVENOUS

## 2014-10-31 MED ORDER — REGADENOSON 0.4 MG/5ML IV SOLN
0.4000 mg | Freq: Once | INTRAVENOUS | Status: AC
  Administered 2014-10-31: 0.4 mg via INTRAVENOUS

## 2014-10-31 MED ORDER — TECHNETIUM TC 99M SESTAMIBI GENERIC - CARDIOLITE
10.8000 | Freq: Once | INTRAVENOUS | Status: AC | PRN
  Administered 2014-10-31: 11 via INTRAVENOUS

## 2014-11-06 ENCOUNTER — Encounter: Payer: Self-pay | Admitting: Cardiology

## 2014-11-12 ENCOUNTER — Encounter: Payer: Self-pay | Admitting: Internal Medicine

## 2014-11-12 ENCOUNTER — Ambulatory Visit (INDEPENDENT_AMBULATORY_CARE_PROVIDER_SITE_OTHER): Payer: Medicare Other | Admitting: Internal Medicine

## 2014-11-12 VITALS — BP 136/52 | HR 67 | Temp 97.8°F | Ht 68.0 in | Wt 212.3 lb

## 2014-11-12 DIAGNOSIS — J438 Other emphysema: Secondary | ICD-10-CM

## 2014-11-12 DIAGNOSIS — E119 Type 2 diabetes mellitus without complications: Secondary | ICD-10-CM

## 2014-11-12 DIAGNOSIS — M150 Primary generalized (osteo)arthritis: Secondary | ICD-10-CM

## 2014-11-12 DIAGNOSIS — Z7984 Long term (current) use of oral hypoglycemic drugs: Secondary | ICD-10-CM | POA: Diagnosis not present

## 2014-11-12 DIAGNOSIS — M545 Low back pain: Secondary | ICD-10-CM

## 2014-11-12 DIAGNOSIS — E139 Other specified diabetes mellitus without complications: Secondary | ICD-10-CM

## 2014-11-12 DIAGNOSIS — J449 Chronic obstructive pulmonary disease, unspecified: Secondary | ICD-10-CM | POA: Diagnosis not present

## 2014-11-12 DIAGNOSIS — M159 Polyosteoarthritis, unspecified: Secondary | ICD-10-CM

## 2014-11-12 DIAGNOSIS — Z7951 Long term (current) use of inhaled steroids: Secondary | ICD-10-CM

## 2014-11-12 DIAGNOSIS — M8949 Other hypertrophic osteoarthropathy, multiple sites: Secondary | ICD-10-CM

## 2014-11-12 LAB — POCT GLYCOSYLATED HEMOGLOBIN (HGB A1C): Hemoglobin A1C: 7.7

## 2014-11-12 LAB — GLUCOSE, CAPILLARY: Glucose-Capillary: 218 mg/dL — ABNORMAL HIGH (ref 65–99)

## 2014-11-12 MED ORDER — CELECOXIB 100 MG PO CAPS: 100.0000 mg | ORAL_CAPSULE | Freq: Every day | ORAL | Status: DC

## 2014-11-12 MED ORDER — TIOTROPIUM BROMIDE MONOHYDRATE 18 MCG IN CAPS: ORAL_CAPSULE | RESPIRATORY_TRACT | Status: DC

## 2014-11-12 MED ORDER — ALBUTEROL SULFATE HFA 108 (90 BASE) MCG/ACT IN AERS: 2.0000 | INHALATION_SPRAY | Freq: Four times a day (QID) | RESPIRATORY_TRACT | Status: DC | PRN

## 2014-11-12 NOTE — Assessment & Plan Note (Signed)
She continues to use her rescue albuterol inhaler frequently. She is adherent to tiotropium, and her LABA/ICS inhaler. She seems to have some misunderstandings on how to appropriately user her rescue inhaler. - Provided counseling on inhaler use technique - Provided patient a spacer to user with her albuterol inhaler and instructed on how to use

## 2014-11-12 NOTE — Assessment & Plan Note (Signed)
Acute back pain consistent with osteoarthritis, musculoskeletal strain, or degenerative disc disease. Most likely OA given that her lower back pain has been an ongoing problem for her as well, however, I could not find any imaging of her lumbar spine in her medical record. Exam findings are equivocal. She continues to have pain in her hands and knees, and has found the Norco to be ineffective. - Celebrex 100 mg daily, with room to increase to 100 mg BID. May consider transition to ibuprofen given that she's on PPIs - Follow-up in one month to assess for improvement - Consider lumbar spine Xrays at next visit

## 2014-11-12 NOTE — Patient Instructions (Signed)
Ms. Carpenito, for the pain in your back, we are prescribing a new drug called Celebrex. We sent this to the [facility] pharmacy on [facility]. We also taught you how to use your albuterol with a spacer today. This will help more of the medicine get to your lungs, and I have attached the instructions below. We will see you in a month to see how your back pain and breathing are going.  Metered Dose Inhaler With   Spacer Inhaled medicines are the basis of treatment of asthma and other breathing problems. Inhaled medicine can only be effective if used properly. Good technique assures that the medicine reaches the lungs. Your health care provider has asked you to use a spacer with your inhaler to help you take the medicine more effectively. A spacer is a plastic tube with a mouthpiece on one end and an opening that connects to the inhaler on the other end. Metered dose inhalers (MDIs) are used to deliver a variety of inhaled medicines. These include quick relief or rescue medicines (such as bronchodilators) and controller medicines (such as corticosteroids). The medicine is delivered by pushing down on a metal canister to release a set amount of spray. If you are using different kinds of inhalers, use your quick relief medicine to open the airways 10-15 minutes before using a steroid if instructed to do so by your health care provider. If you are unsure which inhalers to use and the order of using them, ask your health care provider, nurse, or respiratory therapist. HOW TO USE THE INHALER WITH A SPACER 1. Remove cap from inhaler. 2. If you are using the inhaler for the first time, you will need to prime it. Shake the inhaler for 5 seconds and release four puffs into the air, away from your face. Ask your health care provider or pharmacist if you have questions about priming your inhaler. 3. Shake inhaler for 5 seconds before each breath in (inhalation). 4. Place the open end of the spacer onto the mouthpiece  of the inhaler. 5. Position the inhaler so that the top of the canister faces up and the spacer mouthpiece faces you. 6. Put your index finger on the top of the medicine canister. Your thumb supports the bottom of the inhaler and the spacer. 7. Breathe out (exhale) normally and as completely as possible. 8. Immediately after exhaling, place the spacer between your teeth and into your mouth. Close your mouth tightly around the spacer. 9. Press the canister down with the index finger to release the medicine. 10. At the same time as the canister is pressed, inhale deeply and slowly until the lungs are completely filled. This should take 4-6 seconds. Keep your tongue down and out of the way. 11. Hold the medicine in your lungs for 5-10 seconds (10 seconds is best). This helps the medicine get into the small airways of your lungs. Exhale. 12. Repeat inhaling deeply through the spacer mouthpiece. Again hold that breath for up to 10 seconds (10 seconds is best). Exhale slowly. If it is difficult to take this second deep breath through the spacer, breathe normally several times through the spacer. Remove the spacer from your mouth. 13. Wait at least 15-30 seconds between puffs. Continue with the above steps until you have taken the number of puffs your health care provider has ordered. Do not use the inhaler more than your health care provider directs you to. 14. Remove spacer from the inhaler and place cap on inhaler. 15.  Follow the directions from your health care provider or the inhaler insert for cleaning the inhaler and spacer. If you are using a steroid inhaler, rinse your mouth with water after your last puff, gargle, and spit out the water. Do not swallow the water. AVOID:  Inhaling before or after starting the spray of medicine. It takes practice to coordinate your breathing with triggering the spray.  Inhaling through the nose (rather than the mouth) when triggering the spray. HOW TO DETERMINE IF  YOUR INHALER IS FULL OR NEARLY EMPTY You cannot know when an inhaler is empty by shaking it. A few inhalers are now being made with dose counters. Ask your health care provider for a prescription that has a dose counter if you feel you need that extra help. If your inhaler does not have a counter, ask your health care provider to help you determine the date you need to refill your inhaler. Write the refill date on a calendar or your inhaler canister. Refill your inhaler 7-10 days before it runs out. Be sure to keep an adequate supply of medicine. This includes making sure it is not expired, and you have a spare inhaler.  SEEK MEDICAL CARE IF:   Symptoms are only partially relieved with your inhaler.  You are having trouble using your inhaler.  You experience some increase in phlegm. SEEK IMMEDIATE MEDICAL CARE IF:   You feel little or no relief with your inhalers. You are still wheezing and are feeling shortness of breath or tightness in your chest or both.  You have dizziness, headaches, or fast heart rate.  You have chills, fever, or night sweats.  There is a noticeable increase in phlegm production, or there is blood in the phlegm.   This information is not intended to replace advice given to you by your health care provider. Make sure you discuss any questions you have with your health care provider.   Document Released: 01/19/2005 Document Revised: 06/05/2014 Document Reviewed: 07/07/2012 Elsevier Interactive Patient Education [employer].

## 2014-11-12 NOTE — Progress Notes (Signed)
   Subjective:    Patient ID: Caitlyn Aguirre, female    DOB: 11/20/1935, 79 y.o.   MRN: [id number]  HPI  Caitlyn Aguirre is a 79 year old woman with a PMH of OA, COPD, CAD (s/p stent in 1999)who comes to the clinic today for lower back pain. The pain started Friday and involves the entirety of her lower back. The pain does not radiate to her legs, and she denies any numbness or tingling in her feet. She denies any bowel or bladder incontinence. She cannot identify any injury that would have led to her current symptoms. She says she has felt this lower back pain in the past. She has taken [place] as needed for the pain, but she recently found it to be ineffective. She was recently prescribed the Norco for osteoarthritis in her knees and she has nearly run out of the medication. She continues to have knee and hand pain as well.   She also says she has been using her albuterol inhaler four times a day consistently and that she needs it each time. She reported that when she uses it, she does not hold her breath for 10 seconds. She denies any wheezing, coughing, or shortness of breath today.  Review of Systems  Constitutional: Positive for activity change. Negative for fever.  HENT: Negative for sore throat.   Eyes: Negative for visual disturbance.  Respiratory: Negative for cough, shortness of breath and wheezing.   Cardiovascular: Negative for chest pain, palpitations and leg swelling.  Gastrointestinal: Negative for nausea, vomiting and diarrhea.  Genitourinary: Negative for dysuria.  Musculoskeletal: Positive for back pain and arthralgias.  Neurological: Negative for dizziness and syncope.  Psychiatric/Behavioral: Negative for dysphoric mood.       Objective:   Physical Exam  Constitutional: No distress.  HENT:  Mouth/Throat: No oropharyngeal exudate.  Eyes: EOM are normal. Pupils are equal, round, and reactive to light.  Neck: Neck supple.  Cardiovascular: Normal rate and regular rhythm.     No murmur heard. Pulmonary/Chest: Effort normal and breath sounds normal. No respiratory distress. She has no wheezes.  Abdominal: Soft. Bowel sounds are normal. She exhibits no distension.  Musculoskeletal:       Lumbar back: She exhibits tenderness and pain.       Arms: Lymphadenopathy:    She has no cervical adenopathy.  Neurological: She is alert. She displays normal reflexes.  5/5 strength in all extremities. Lower back pain when standing from seated position.  Skin: Skin is warm and dry. No rash noted.  Psychiatric: She has a normal mood and affect.  Vitals reviewed.     Assessment & Plan:   Please see problem-based assessment and plan for details.

## 2014-11-12 NOTE — Assessment & Plan Note (Addendum)
A1c is at her personal goal of 7-8% at 7.7 today, an increase from 7.0 four months ago. She is tolerating her metformin well. She remains well-controlled without glypizide. - Continue current regimen

## 2014-11-14 NOTE — Progress Notes (Signed)
I saw and evaluated the patient. I personally confirmed the key portions of Dr. Barbera Setters history and exam and reviewed pertinent patient test results. The assessment, diagnosis, and plan were formulated together and I agree with the documentation in the resident's note.  In addition to her lack of breath hold after using her inhalers, she also did not fully empty her lungs prior to a full inhalation, nor did she start inhalation prior to actuation.  We spent nearly 5 minutes explanation and demonstrating appropriate inhaler technique.  In addition, we provided her with a spacer and demonstrated how to use it.

## 2014-11-15 ENCOUNTER — Encounter (HOSPITAL_COMMUNITY): Payer: Self-pay | Admitting: Emergency Medicine

## 2014-11-15 ENCOUNTER — Emergency Department (INDEPENDENT_AMBULATORY_CARE_PROVIDER_SITE_OTHER)
Admission: EM | Admit: 2014-11-15 | Discharge: 2014-11-15 | Disposition: A | Discharge status: Home / Self Care | Payer: Medicare Other | Source: Home / Self Care | Attending: Emergency Medicine | Admitting: Emergency Medicine

## 2014-11-15 DIAGNOSIS — M153 Secondary multiple arthritis: Secondary | ICD-10-CM | POA: Diagnosis not present

## 2014-11-15 DIAGNOSIS — M791 Myalgia, unspecified site: Secondary | ICD-10-CM

## 2014-11-15 DIAGNOSIS — M545 Low back pain, unspecified: Secondary | ICD-10-CM

## 2014-11-15 MED ORDER — METHYLPREDNISOLONE ACETATE 80 MG/ML IJ SUSP
80.0000 mg | Freq: Once | INTRAMUSCULAR | Status: AC
  Administered 2014-11-15: 80 mg via INTRAMUSCULAR

## 2014-11-15 MED ORDER — METHYLPREDNISOLONE ACETATE 80 MG/ML IJ SUSP
INTRAMUSCULAR | Status: AC
  Filled 2014-11-15: qty 1

## 2014-11-15 NOTE — ED Notes (Signed)
C/o back and bilateral hip pain since Saturday.  Since Saturday, pain has worsened.  Patient saw her pcp-internal medicine-last Monday 10/10.  Reports pain medication is not helping.  Was told "inflammation"
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
## 2014-11-15 NOTE — Discharge Instructions (Signed)
Back Pain, Adult °Back pain is very common in adults. The cause of back pain is rarely dangerous and the pain often gets better over time. The cause of your back pain may not be known. Some common causes of back pain include: °· Strain of the muscles or ligaments supporting the spine. °· Wear and tear (degeneration) of the spinal disks. °· Arthritis. °· Direct injury to the back. °For many people, back pain may return. Since back pain is rarely dangerous, most people can learn to manage this condition on their own. °HOME CARE INSTRUCTIONS °Watch your back pain for any changes. The following actions may help to lessen any discomfort you are feeling: °· Remain active. It is stressful on your back to sit or stand in one place for long periods of time. Do not sit, drive, or stand in one place for more than 30 minutes at a time. Take short walks on even surfaces as soon as you are able. Try to increase the length of time you walk each day. °· Exercise regularly as directed by your health care provider. Exercise helps your back heal faster. It also helps avoid future injury by keeping your muscles strong and flexible. °· Do not stay in bed. Resting more than 1-2 days can delay your recovery. °· Pay attention to your body when you bend and lift. The most comfortable positions are those that put less stress on your recovering back. Always use proper lifting techniques, including: °¨ Bending your knees. °¨ Keeping the load close to your body. °¨ Avoiding twisting. °· Find a comfortable position to sleep. Use a firm mattress and lie on your side with your knees slightly bent. If you lie on your back, put a pillow under your knees. °· Avoid feeling anxious or stressed. Stress increases muscle tension and can worsen back pain. It is important to recognize when you are anxious or stressed and learn ways to manage it, such as with exercise. °· Take medicines only as directed by your health care provider. Over-the-counter  medicines to reduce pain and inflammation are often the most helpful. Your health care provider may prescribe muscle relaxant drugs. These medicines help dull your pain so you can more quickly return to your normal activities and healthy exercise. °· Apply ice to the injured area: °¨ Put ice in a plastic bag. °¨ Place a towel between your skin and the bag. °¨ Leave the ice on for 20 minutes, 2-3 times a day for the first 2-3 days. After that, ice and heat may be alternated to reduce pain and spasms. °· Maintain a healthy weight. Excess weight puts extra stress on your back and makes it difficult to maintain good posture. °SEEK MEDICAL CARE IF: °· You have pain that is not relieved with rest or medicine. °· You have increasing pain going down into the legs or buttocks. °· You have pain that does not improve in one week. °· You have night pain. °· You lose weight. °· You have a fever or chills. °SEEK IMMEDIATE MEDICAL CARE IF:  °· You develop new bowel or bladder control problems. °· You have unusual weakness or numbness in your arms or legs. °· You develop nausea or vomiting. °· You develop abdominal pain. °· You feel faint. °  °This information is not intended to replace advice given to you by your health care provider. Make sure you discuss any questions you have with your health care provider. °  °Document Released: 01/19/2005 Document Revised: 02/09/2014 Document Reviewed: 05/23/2013 °Elsevier Interactive Patient Education ©2016 Elsevier   Inc.  Muscle Pain, Adult Muscle pain (myalgia) may be caused by many things, including:  Overuse or muscle strain, especially if you are not in shape. This is the most common cause of muscle pain.  Injury.  Bruises.  Viruses, such as the flu.  Infectious diseases.  Fibromyalgia, which is a chronic condition that causes muscle tenderness, fatigue, and headache.  Autoimmune diseases, including lupus.  Certain drugs, including ACE inhibitors and statins. Muscle  pain may be mild or severe. In most cases, the pain lasts only a short time and goes away without treatment. To diagnose the cause of your muscle pain, your health care provider will take your medical history. This means he or she will ask you when your muscle pain began and what has been happening. If you have not had muscle pain for very long, your health care provider may want to wait before doing much testing. If your muscle pain has lasted a long time, your health care provider may want to run tests right away. If your health care provider thinks your muscle pain may be caused by illness, you may need to have additional tests to rule out certain conditions.  Treatment for muscle pain depends on the cause. Home care is often enough to relieve muscle pain. Your health care provider may also prescribe anti-inflammatory medicine. HOME CARE INSTRUCTIONS Watch your condition for any changes. The following actions may help to lessen any discomfort you are feeling:  Only take over-the-counter or prescription medicines as directed by your health care provider.  Apply ice to the sore muscle:  Put ice in a plastic bag.  Place a towel between your skin and the bag.  Leave the ice on for 15-20 minutes, 3-4 times a day.  You may alternate applying hot and cold packs to the muscle as directed by your health care provider.  If overuse is causing your muscle pain, slow down your activities until the pain goes away.  Remember that it is normal to feel some muscle pain after starting a workout program. Muscles that have not been used often will be sore at first.  Do regular, gentle exercises if you are not usually active.  Warm up before exercising to lower your risk of muscle pain.  Do not continue working out if the pain is very bad. Bad pain could mean you have injured a muscle. SEEK MEDICAL CARE IF:  Your muscle pain gets worse, and medicines do not help.  You have muscle pain that lasts longer  than 3 days.  You have a rash or fever along with muscle pain.  You have muscle pain after a tick bite.  You have muscle pain while working out, even though you are in good physical condition.  You have redness, soreness, or swelling along with muscle pain.  You have muscle pain after starting a new medicine or changing the dose of a medicine. SEEK IMMEDIATE MEDICAL CARE IF:  You have trouble breathing.  You have trouble swallowing.  You have muscle pain along with a stiff neck, fever, and vomiting.  You have severe muscle weakness or cannot move part of your body. MAKE SURE YOU:   Understand these instructions.  Will watch your condition.  Will get help right away if you are not doing well or get worse.   This information is not intended to replace advice given to you by your health care provider. Make sure you discuss any questions you have with your health care provider.  Document Released: 12/11/2005 Document Revised: 02/09/2014 Document Reviewed: 11/15/2012 Elsevier Interactive Patient Education [employer].

## 2014-12-03 ENCOUNTER — Observation Stay (HOSPITAL_COMMUNITY)
Admission: EM | Admit: 2014-12-03 | Discharge: 2014-12-04 | Disposition: A | Discharge status: Home / Self Care | Payer: Medicare Other | Attending: Infectious Diseases | Admitting: Infectious Diseases

## 2014-12-03 ENCOUNTER — Observation Stay (HOSPITAL_COMMUNITY): Payer: Medicare Other

## 2014-12-03 ENCOUNTER — Emergency Department (HOSPITAL_COMMUNITY): Payer: Medicare Other

## 2014-12-03 ENCOUNTER — Encounter (HOSPITAL_COMMUNITY): Payer: Self-pay | Admitting: General Practice

## 2014-12-03 DIAGNOSIS — J441 Chronic obstructive pulmonary disease with (acute) exacerbation: Secondary | ICD-10-CM | POA: Diagnosis present

## 2014-12-03 DIAGNOSIS — K219 Gastro-esophageal reflux disease without esophagitis: Secondary | ICD-10-CM | POA: Diagnosis present

## 2014-12-03 DIAGNOSIS — E785 Hyperlipidemia, unspecified: Secondary | ICD-10-CM | POA: Insufficient documentation

## 2014-12-03 DIAGNOSIS — Z955 Presence of coronary angioplasty implant and graft: Secondary | ICD-10-CM | POA: Insufficient documentation

## 2014-12-03 DIAGNOSIS — R911 Solitary pulmonary nodule: Secondary | ICD-10-CM | POA: Diagnosis not present

## 2014-12-03 DIAGNOSIS — Z0389 Encounter for observation for other suspected diseases and conditions ruled out: Secondary | ICD-10-CM

## 2014-12-03 DIAGNOSIS — M1990 Unspecified osteoarthritis, unspecified site: Secondary | ICD-10-CM | POA: Diagnosis not present

## 2014-12-03 DIAGNOSIS — IMO0001 Reserved for inherently not codable concepts without codable children: Secondary | ICD-10-CM | POA: Diagnosis present

## 2014-12-03 DIAGNOSIS — Z9119 Patient's noncompliance with other medical treatment and regimen: Secondary | ICD-10-CM

## 2014-12-03 DIAGNOSIS — I2510 Atherosclerotic heart disease of native coronary artery without angina pectoris: Secondary | ICD-10-CM | POA: Insufficient documentation

## 2014-12-03 DIAGNOSIS — F329 Major depressive disorder, single episode, unspecified: Secondary | ICD-10-CM | POA: Diagnosis not present

## 2014-12-03 DIAGNOSIS — Z87891 Personal history of nicotine dependence: Secondary | ICD-10-CM | POA: Insufficient documentation

## 2014-12-03 DIAGNOSIS — J449 Chronic obstructive pulmonary disease, unspecified: Secondary | ICD-10-CM | POA: Diagnosis not present

## 2014-12-03 DIAGNOSIS — G4733 Obstructive sleep apnea (adult) (pediatric): Secondary | ICD-10-CM | POA: Diagnosis not present

## 2014-12-03 DIAGNOSIS — E119 Type 2 diabetes mellitus without complications: Secondary | ICD-10-CM | POA: Diagnosis not present

## 2014-12-03 DIAGNOSIS — I252 Old myocardial infarction: Secondary | ICD-10-CM | POA: Diagnosis not present

## 2014-12-03 DIAGNOSIS — K5909 Other constipation: Secondary | ICD-10-CM | POA: Diagnosis not present

## 2014-12-03 DIAGNOSIS — E039 Hypothyroidism, unspecified: Secondary | ICD-10-CM | POA: Diagnosis not present

## 2014-12-03 DIAGNOSIS — R0902 Hypoxemia: Secondary | ICD-10-CM

## 2014-12-03 DIAGNOSIS — I10 Essential (primary) hypertension: Secondary | ICD-10-CM | POA: Diagnosis not present

## 2014-12-03 DIAGNOSIS — Z794 Long term (current) use of insulin: Secondary | ICD-10-CM | POA: Diagnosis not present

## 2014-12-03 DIAGNOSIS — R918 Other nonspecific abnormal finding of lung field: Secondary | ICD-10-CM | POA: Diagnosis not present

## 2014-12-03 DIAGNOSIS — R531 Weakness: Secondary | ICD-10-CM | POA: Diagnosis not present

## 2014-12-03 DIAGNOSIS — I110 Hypertensive heart disease with heart failure: Secondary | ICD-10-CM | POA: Diagnosis not present

## 2014-12-03 DIAGNOSIS — E1169 Type 2 diabetes mellitus with other specified complication: Secondary | ICD-10-CM

## 2014-12-03 DIAGNOSIS — R05 Cough: Secondary | ICD-10-CM | POA: Diagnosis not present

## 2014-12-03 DIAGNOSIS — F4321 Adjustment disorder with depressed mood: Secondary | ICD-10-CM

## 2014-12-03 DIAGNOSIS — I5032 Chronic diastolic (congestive) heart failure: Secondary | ICD-10-CM

## 2014-12-03 DIAGNOSIS — Z8673 Personal history of transient ischemic attack (TIA), and cerebral infarction without residual deficits: Secondary | ICD-10-CM | POA: Diagnosis not present

## 2014-12-03 DIAGNOSIS — R0789 Other chest pain: Secondary | ICD-10-CM | POA: Diagnosis not present

## 2014-12-03 DIAGNOSIS — F32A Depression, unspecified: Secondary | ICD-10-CM | POA: Diagnosis present

## 2014-12-03 LAB — HEPATIC FUNCTION PANEL
ALT: 11 U/L — ABNORMAL LOW (ref 14–54)
AST: 16 U/L (ref 15–41)
Albumin: 3.7 g/dL (ref 3.5–5.0)
Alkaline Phosphatase: 113 U/L (ref 38–126)
Bilirubin, Direct: 0.2 mg/dL (ref 0.1–0.5)
Indirect Bilirubin: 0.8 mg/dL (ref 0.3–0.9)
Total Bilirubin: 1 mg/dL (ref 0.3–1.2)
Total Protein: 7.1 g/dL (ref 6.5–8.1)

## 2014-12-03 LAB — EXPECTORATED SPUTUM ASSESSMENT W GRAM STAIN, RFLX TO RESP C: Special Requests: NORMAL

## 2014-12-03 LAB — CBC WITH DIFFERENTIAL/PLATELET
Basophils Absolute: 0 10*3/uL (ref 0.0–0.1)
Basophils Relative: 0 %
Eosinophils Absolute: 0.4 10*3/uL (ref 0.0–0.7)
Eosinophils Relative: 3 %
HCT: 37.8 % (ref 36.0–46.0)
Hemoglobin: 12.8 g/dL (ref 12.0–15.0)
Lymphocytes Relative: 11 %
Lymphs Abs: 1.3 10*3/uL (ref 0.7–4.0)
MCH: 29.6 pg (ref 26.0–34.0)
MCHC: 33.9 g/dL (ref 30.0–36.0)
MCV: 87.3 fL (ref 78.0–100.0)
Monocytes Absolute: 1.1 10*3/uL — ABNORMAL HIGH (ref 0.1–1.0)
Monocytes Relative: 9 %
Neutro Abs: 9.3 10*3/uL — ABNORMAL HIGH (ref 1.7–7.7)
Neutrophils Relative %: 77 %
Platelets: 158 10*3/uL (ref 150–400)
RBC: 4.33 MIL/uL (ref 3.87–5.11)
RDW: 15 % (ref 11.5–15.5)
WBC: 12.1 10*3/uL — ABNORMAL HIGH (ref 4.0–10.5)

## 2014-12-03 LAB — BASIC METABOLIC PANEL
Anion gap: 10 (ref 5–15)
BUN: 9 mg/dL (ref 6–20)
CO2: 23 mmol/L (ref 22–32)
Calcium: 10.2 mg/dL (ref 8.9–10.3)
Chloride: 106 mmol/L (ref 101–111)
Creatinine, Ser: 0.77 mg/dL (ref 0.44–1.00)
GFR calc Af Amer: >60 mL/min (ref >60)
GFR calc non Af Amer: >60 mL/min (ref >60)
Glucose, Bld: 197 mg/dL — ABNORMAL HIGH (ref 65–99)
Potassium: 3.7 mmol/L (ref 3.5–5.1)
Sodium: 139 mmol/L (ref 135–145)

## 2014-12-03 LAB — I-STAT TROPONIN, ED: Troponin i, poc: 0 ng/mL (ref 0.00–0.08)

## 2014-12-03 LAB — GLUCOSE, CAPILLARY
Glucose-Capillary: 327 mg/dL — ABNORMAL HIGH (ref 65–99)
Glucose-Capillary: 295 mg/dL — ABNORMAL HIGH (ref 65–99)

## 2014-12-03 LAB — TSH: TSH: 1.266 u[IU]/mL (ref 0.350–4.500)

## 2014-12-03 LAB — CBG MONITORING, ED: Glucose-Capillary: 180 mg/dL — ABNORMAL HIGH (ref 65–99)

## 2014-12-03 LAB — MAGNESIUM: Magnesium: 1.5 mg/dL — ABNORMAL LOW (ref 1.7–2.4)

## 2014-12-03 LAB — PROTIME-INR
INR: 1.05 (ref 0.00–1.49)
Prothrombin Time: 13.9 seconds (ref 11.6–15.2)

## 2014-12-03 LAB — MRSA PCR SCREENING: MRSA by PCR: NEGATIVE

## 2014-12-03 LAB — PHOSPHORUS: Phosphorus: 2.9 mg/dL (ref 2.5–4.6)

## 2014-12-03 MED ORDER — METHYLPREDNISOLONE SODIUM SUCC 125 MG IJ SOLR
60.0000 mg | Freq: Four times a day (QID) | INTRAMUSCULAR | Status: AC
  Administered 2014-12-03 (×2): 60 mg via INTRAVENOUS
  Filled 2014-12-03 (×2): qty 2

## 2014-12-03 MED ORDER — INSULIN ASPART 100 UNIT/ML ~~LOC~~ SOLN
0.0000 [IU] | Freq: Three times a day (TID) | SUBCUTANEOUS | Status: DC
  Administered 2014-12-03: 11 [IU] via SUBCUTANEOUS
  Administered 2014-12-03: 3 [IU] via SUBCUTANEOUS
  Administered 2014-12-04: 8 [IU] via SUBCUTANEOUS
  Administered 2014-12-04: 5 [IU] via SUBCUTANEOUS
  Filled 2014-12-03: qty 1

## 2014-12-03 MED ORDER — MOMETASONE FURO-FORMOTEROL FUM 100-5 MCG/ACT IN AERO
2.0000 | INHALATION_SPRAY | Freq: Two times a day (BID) | RESPIRATORY_TRACT | Status: DC
  Administered 2014-12-03 – 2014-12-04 (×3): 2 via RESPIRATORY_TRACT
  Filled 2014-12-03: qty 8.8

## 2014-12-03 MED ORDER — SODIUM CHLORIDE 0.9 % IJ SOLN
3.0000 mL | Freq: Two times a day (BID) | INTRAMUSCULAR | Status: DC
  Administered 2014-12-03: 3 mL via INTRAVENOUS

## 2014-12-03 MED ORDER — GUAIFENESIN 100 MG/5ML PO SOLN
5.0000 mL | Freq: Once | ORAL | Status: AC
  Administered 2014-12-03: 100 mg via ORAL
  Filled 2014-12-03: qty 5

## 2014-12-03 MED ORDER — ENOXAPARIN SODIUM 40 MG/0.4ML ~~LOC~~ SOLN
40.0000 mg | SUBCUTANEOUS | Status: DC
  Administered 2014-12-03 – 2014-12-04 (×2): 40 mg via SUBCUTANEOUS
  Filled 2014-12-03 (×3): qty 0.4

## 2014-12-03 MED ORDER — LEVOTHYROXINE SODIUM 88 MCG PO TABS
88.0000 ug | ORAL_TABLET | Freq: Every day | ORAL | Status: DC
  Administered 2014-12-04: 88 ug via ORAL
  Filled 2014-12-03: qty 1

## 2014-12-03 MED ORDER — ALBUTEROL SULFATE (2.5 MG/3ML) 0.083% IN NEBU: 5.0000 mg | INHALATION_SOLUTION | RESPIRATORY_TRACT | Status: DC | PRN

## 2014-12-03 MED ORDER — IPRATROPIUM-ALBUTEROL 0.5-2.5 (3) MG/3ML IN SOLN
3.0000 mL | RESPIRATORY_TRACT | Status: DC
  Administered 2014-12-03 (×2): 3 mL via RESPIRATORY_TRACT
  Filled 2014-12-03 (×2): qty 3

## 2014-12-03 MED ORDER — ATORVASTATIN CALCIUM 40 MG PO TABS
40.0000 mg | ORAL_TABLET | Freq: Every day | ORAL | Status: DC
  Administered 2014-12-03: 40 mg via ORAL
  Filled 2014-12-03: qty 1

## 2014-12-03 MED ORDER — HYDROCODONE-ACETAMINOPHEN 5-325 MG PO TABS
1.0000 | ORAL_TABLET | Freq: Once | ORAL | Status: AC
  Administered 2014-12-03: 1 via ORAL
  Filled 2014-12-03: qty 1

## 2014-12-03 MED ORDER — LISINOPRIL 20 MG PO TABS
20.0000 mg | ORAL_TABLET | Freq: Every day | ORAL | Status: DC
  Administered 2014-12-03 – 2014-12-04 (×2): 20 mg via ORAL
  Filled 2014-12-03 (×2): qty 1

## 2014-12-03 MED ORDER — ACETAMINOPHEN 650 MG RE SUPP: 650.0000 mg | Freq: Four times a day (QID) | RECTAL | Status: DC | PRN

## 2014-12-03 MED ORDER — ACETAMINOPHEN 325 MG PO TABS
650.0000 mg | ORAL_TABLET | Freq: Four times a day (QID) | ORAL | Status: DC | PRN
  Filled 2014-12-03: qty 2

## 2014-12-03 MED ORDER — SENNOSIDES-DOCUSATE SODIUM 8.6-50 MG PO TABS
1.0000 | ORAL_TABLET | Freq: Every day | ORAL | Status: DC
  Administered 2014-12-03 – 2014-12-04 (×2): 1 via ORAL
  Filled 2014-12-03 (×2): qty 1

## 2014-12-03 MED ORDER — PANTOPRAZOLE SODIUM 40 MG PO TBEC
40.0000 mg | DELAYED_RELEASE_TABLET | Freq: Every day | ORAL | Status: DC
  Administered 2014-12-03 – 2014-12-04 (×2): 40 mg via ORAL
  Filled 2014-12-03 (×2): qty 1

## 2014-12-03 MED ORDER — IOHEXOL 300 MG/ML  SOLN
75.0000 mL | Freq: Once | INTRAMUSCULAR | Status: AC | PRN
  Administered 2014-12-03: 75 mL via INTRAVENOUS

## 2014-12-03 MED ORDER — IPRATROPIUM-ALBUTEROL 0.5-2.5 (3) MG/3ML IN SOLN
3.0000 mL | Freq: Once | RESPIRATORY_TRACT | Status: AC
  Administered 2014-12-03: 3 mL via RESPIRATORY_TRACT
  Filled 2014-12-03: qty 3

## 2014-12-03 MED ORDER — PAROXETINE HCL 20 MG PO TABS
30.0000 mg | ORAL_TABLET | Freq: Every day | ORAL | Status: DC
  Administered 2014-12-03 – 2014-12-04 (×2): 30 mg via ORAL
  Filled 2014-12-03: qty 1
  Filled 2014-12-03: qty 2

## 2014-12-03 MED ORDER — AMLODIPINE BESYLATE 5 MG PO TABS
5.0000 mg | ORAL_TABLET | Freq: Every day | ORAL | Status: DC
  Administered 2014-12-03 – 2014-12-04 (×2): 5 mg via ORAL
  Filled 2014-12-03 (×2): qty 1

## 2014-12-03 MED ORDER — INFLUENZA VAC SPLIT QUAD 0.5 ML IM SUSY: 0.5000 mL | PREFILLED_SYRINGE | INTRAMUSCULAR | Status: DC | PRN

## 2014-12-03 MED ORDER — PREDNISONE 20 MG PO TABS
40.0000 mg | ORAL_TABLET | Freq: Every day | ORAL | Status: DC
  Administered 2014-12-04: 40 mg via ORAL
  Filled 2014-12-03: qty 2

## 2014-12-03 MED ORDER — IPRATROPIUM-ALBUTEROL 0.5-2.5 (3) MG/3ML IN SOLN
3.0000 mL | RESPIRATORY_TRACT | Status: DC
Start: 2014-12-04 — End: 2014-12-04
  Administered 2014-12-04 (×2): 3 mL via RESPIRATORY_TRACT
  Filled 2014-12-03 (×3): qty 3

## 2014-12-03 MED ORDER — DOXYCYCLINE HYCLATE 100 MG PO TABS: 100.0000 mg | ORAL_TABLET | Freq: Two times a day (BID) | ORAL | Status: DC

## 2014-12-03 MED ORDER — DOXYCYCLINE HYCLATE 100 MG IV SOLR
100.0000 mg | Freq: Two times a day (BID) | INTRAVENOUS | Status: DC
  Administered 2014-12-03: 100 mg via INTRAVENOUS
  Filled 2014-12-03 (×5): qty 100

## 2014-12-03 MED ORDER — HYDROCODONE-ACETAMINOPHEN 5-325 MG PO TABS
1.0000 | ORAL_TABLET | Freq: Two times a day (BID) | ORAL | Status: DC | PRN
  Administered 2014-12-03 – 2014-12-04 (×2): 1 via ORAL
  Filled 2014-12-03 (×2): qty 1

## 2014-12-03 MED ORDER — CARVEDILOL 25 MG PO TABS
25.0000 mg | ORAL_TABLET | Freq: Two times a day (BID) | ORAL | Status: DC
  Administered 2014-12-03 – 2014-12-04 (×2): 25 mg via ORAL
  Filled 2014-12-03 (×2): qty 1

## 2014-12-03 MED ORDER — ASPIRIN EC 81 MG PO TBEC
81.0000 mg | DELAYED_RELEASE_TABLET | Freq: Every day | ORAL | Status: DC
  Administered 2014-12-03 – 2014-12-04 (×2): 81 mg via ORAL
  Filled 2014-12-03 (×2): qty 1
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
## 2014-12-03 NOTE — ED Notes (Signed)
Pt presents with complaints of a cough and shortness of breath that started on Saturday 12/01/14. Pt reports having fever, chills and chest pain when coughing. Pt is A/O. Pt coughing up yellow sputum. Pt has a history of CHF.

## 2014-12-03 NOTE — ED Notes (Signed)
Attempted report 
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
## 2014-12-03 NOTE — ED Notes (Signed)
CBG = 180  Dreama RN informed of result.

## 2014-12-04 DIAGNOSIS — J449 Chronic obstructive pulmonary disease, unspecified: Secondary | ICD-10-CM | POA: Diagnosis not present

## 2014-12-04 LAB — CBC WITH DIFFERENTIAL/PLATELET
Basophils Absolute: 0 10*3/uL (ref 0.0–0.1)
Basophils Relative: 0 %
Eosinophils Absolute: 0 10*3/uL (ref 0.0–0.7)
Eosinophils Relative: 0 %
HCT: 35.8 % — ABNORMAL LOW (ref 36.0–46.0)
Hemoglobin: 12.2 g/dL (ref 12.0–15.0)
Lymphocytes Relative: 6 %
Lymphs Abs: 0.7 10*3/uL (ref 0.7–4.0)
MCH: 29.6 pg (ref 26.0–34.0)
MCHC: 34.1 g/dL (ref 30.0–36.0)
MCV: 86.9 fL (ref 78.0–100.0)
Monocytes Absolute: 0.7 10*3/uL (ref 0.1–1.0)
Monocytes Relative: 6 %
Neutro Abs: 10.2 10*3/uL — ABNORMAL HIGH (ref 1.7–7.7)
Neutrophils Relative %: 88 %
Platelets: 176 10*3/uL (ref 150–400)
RBC: 4.12 MIL/uL (ref 3.87–5.11)
RDW: 15.1 % (ref 11.5–15.5)
WBC: 11.6 10*3/uL — ABNORMAL HIGH (ref 4.0–10.5)

## 2014-12-04 LAB — GLUCOSE, CAPILLARY
Glucose-Capillary: 292 mg/dL — ABNORMAL HIGH (ref 65–99)
Glucose-Capillary: 238 mg/dL — ABNORMAL HIGH (ref 65–99)

## 2014-12-04 LAB — BASIC METABOLIC PANEL
Anion gap: 9 (ref 5–15)
BUN: 15 mg/dL (ref 6–20)
CO2: 25 mmol/L (ref 22–32)
Calcium: 9.8 mg/dL (ref 8.9–10.3)
Chloride: 100 mmol/L — ABNORMAL LOW (ref 101–111)
Creatinine, Ser: 1.01 mg/dL — ABNORMAL HIGH (ref 0.44–1.00)
GFR calc Af Amer: >60 mL/min (ref >60)
GFR calc non Af Amer: 52 mL/min — ABNORMAL LOW (ref >60)
Glucose, Bld: 304 mg/dL — ABNORMAL HIGH (ref 65–99)
Potassium: 3.8 mmol/L (ref 3.5–5.1)
Sodium: 134 mmol/L — ABNORMAL LOW (ref 135–145)

## 2014-12-04 LAB — HCV COMMENT:

## 2014-12-04 LAB — HEPATITIS C ANTIBODY (REFLEX): HCV Ab: <0.1 s/co ratio (ref 0.0–0.9)

## 2014-12-04 MED ORDER — DOXYCYCLINE HYCLATE 100 MG PO TABS
100.0000 mg | ORAL_TABLET | Freq: Two times a day (BID) | ORAL | Status: DC
  Administered 2014-12-04: 100 mg via ORAL
  Filled 2014-12-04: qty 1

## 2014-12-04 MED ORDER — DOXYCYCLINE HYCLATE 100 MG PO CAPS: 100.0000 mg | ORAL_CAPSULE | Freq: Two times a day (BID) | ORAL | Status: AC

## 2014-12-04 MED ORDER — PREDNISONE 20 MG PO TABS: 20.0000 mg | ORAL_TABLET | ORAL | Status: DC

## 2014-12-04 MED ORDER — DOXYCYCLINE HYCLATE 100 MG PO CAPS: 100.0000 mg | ORAL_CAPSULE | Freq: Two times a day (BID) | ORAL | Status: DC

## 2014-12-04 NOTE — Progress Notes (Signed)
Inpatient Diabetes Program Recommendations  AACE/ADA: New Consensus Statement on Inpatient Glycemic Control (2015)  Target Ranges:  Prepandial:   less than 140 mg/dL      Peak postprandial:   less than 180 mg/dL (1-2 hours)      Critically ill patients:  140 - 180 mg/dL  Results for Caitlyn Aguirre, Caitlyn Aguirre (MRN [id number]) as of 12/04/2014 11:00  Ref. Range 12/03/2014 11:13 12/03/2014 16:11 12/03/2014 21:08 12/04/2014 06:12  Glucose-Capillary Latest Ref Range: 65-99 mg/dL 180 (H) 327 (H) 295 (H) 292 (H)   Review of Glycemic Control  Diabetes history: DM2 Outpatient Diabetes medications: Metformin 1000 mg BID Current orders for Inpatient glycemic control: Novolog 0-15 units TID with meals  Inpatient Diabetes Program Recommendations Correction (SSI): Please consider increasing Novolog correction to resistant scale and adding Novolog bedtime correction scale. Insulin - Meal Coverage: While inpatient and ordered steroids, please consider ordering Novolog 4 units TID with meals for meal coverage.  Thanks, Barnie Alderman, RN, MSN, CDE Diabetes Coordinator Inpatient Diabetes Program [phone] (Team Pager from [place] to [place]) [phone] (AP office) [phone] [facility] office) [phone] [facility] office)

## 2014-12-04 NOTE — Consult Note (Signed)
   [facility] CM Inpatient Consult   12/04/2014  Caitlyn Aguirre 16-Sep-1935 [id number] Patient evaluated for community based chronic disease management services with [place] [facility] Program as a benefit of patient's [facility] Medicare Insurance for restart of services. Spoke with patient at bedside to explain [place] Management services.  Consent form signed.  Patient will receive post discharge transition of care call and will be evaluated for monthly home visits for assessments and disease process education.  Left contact information and [facility] literature at bedside. Made Inpatient Case Manager aware that [place] Management following. Of note, [facility] Care Management services does not replace or interfere with any services that are arranged by inpatient case management or social work.  For additional questions or referrals please contact:   Natividad Brood, RN BSN [place] Liaison  [phone] business mobile phone

## 2014-12-04 NOTE — Discharge Summary (Signed)
Name: Caitlyn Aguirre MRN: [id number] DOB: 1935-11-14 79 y.o. PCP: Axel Filler, MD  Date of Admission: 12/03/2014  7:34 AM Date of Discharge: 12/04/2014 Attending Physician: Campbell Riches, MD  Discharge Diagnosis: Principal Problem:   COPD exacerbation [facility]) Active Problems:   Hypothyroidism   Diabetes mellitus ([place])   Depression   Essential hypertension   GASTROESOPHAGEAL REFLUX DISEASE   Osteoarthritis   CAD (coronary artery disease)  Discharge Medications:   Medication List    ASK your doctor about these medications        ADVAIR DISKUS 250-50 MCG/DOSE Aepb  Generic drug:  Fluticasone-Salmeterol  Use 1 puff two times daily     albuterol 108 (90 BASE) MCG/ACT inhaler  Commonly known as:  PROAIR HFA  Inhale 2 puffs into the lungs every 6 (six) hours as needed for wheezing or shortness of breath. Use with Spacer     amLODipine 5 MG tablet  Commonly known as:  NORVASC  TAKE 1 TABLET BY MOUTH EVERY DAY     aspirin EC 81 MG tablet  Take 1 tablet (81 mg total) by mouth daily.     atorvastatin 40 MG tablet  Commonly known as:  LIPITOR  Take 1 tablet by mouth  daily at 6PM     carvedilol 25 MG tablet  Commonly known as:  COREG  Take 1 tablet (25 mg total) by mouth 2 (two) times daily with a meal.     celecoxib 100 MG capsule  Commonly known as:  CELEBREX  Take 1 capsule (100 mg total) by mouth daily.     glucose blood test strip  Commonly known as:  ACCU-CHEK SMARTVIEW  Use as instructed     HYDROcodone-acetaminophen 5-325 MG tablet  Commonly known as:  NORCO/VICODIN  Take 1 tablet by mouth 2 (two) times daily as needed for moderate pain.     levothyroxine 88 MCG tablet  Commonly known as:  SYNTHROID, LEVOTHROID  Take 1 tablet by mouth  daily before breakfast     lisinopril 20 MG tablet  Commonly known as:  PRINIVIL,ZESTRIL  Take 1 tablet by mouth  daily     metFORMIN 1000 MG tablet  Commonly known as:  GLUCOPHAGE  Take 1 tablet (1,000 mg  total) by mouth 2 (two) times daily with a meal.     omeprazole 20 MG capsule  Commonly known as:  PRILOSEC  Take 1 capsule by mouth  every day     PARoxetine 30 MG tablet  Commonly known as:  PAXIL  Take 1 tablet (30 mg total) by mouth daily.     senna-docusate 8.6-50 MG tablet  Commonly known as:  Senokot-S  Take 1 tablet by mouth daily.     tiotropium 18 MCG inhalation capsule  Commonly known as:  SPIRIVA HANDIHALER  INHALE THE CONTENTS OF 1 CAPSULE VIA HANDIHALER BY MOUTH EVERY DAY     triamcinolone ointment 0.1 %  Commonly known as:  KENALOG  Apply 1 application topically daily as needed.        Disposition and follow-up:   Caitlyn Aguirre was discharged from [facility] in Good condition.  At the hospital follow up visit please address:  1. COPD exacerbation: Discahrged on prednisone taper 40mg  x5 days 20mg  x5 days plus doxycycline x5 days. Follow up improvement in dyspnea, use of home oxygen.  Referral to pulmonary rehab would be of benefit due to her historically only GOLD stage I COPD, and now with  hypoxia on exertion.  2. Insomnia: Patient requesting Lorrin Mais for sleep, states it is currently worsened by anxiety and stress from family situations. Refused at time of discharge and patient has not been taking it for at least several months. Can discuss alternative options or see if it is improving outside the hospital.  Follow-up Appointments:     Follow-up Information    Follow up with Maryellen Pile, MD. Go in 3 days.   Specialty:  Internal Medicine   Why:  @3 :45 Friday, November 4 for hospital follow up   Contact information:   [address] [place] [address] [phone]       Discharge Instructions:   Consultations:    Procedures Performed:  Dg Chest 2 View  12/03/2014  CLINICAL DATA:  Headache, history of pneumonia, cough EXAM: CHEST  2 VIEW COMPARISON:  07/20/2014 and 03/23/2014 FINDINGS: Cardiomediastinal silhouette is  stable. Right hilar prominence again noted. Again noted streaky bilateral basilar atelectasis or scarring. No segmental infiltrate or pulmonary edema. Degenerative changes mid and lower thoracic spine. There is nodular density in left upper lobe measures 7.5 mm. Second nodular density in left upper lobe measures 4 mm. Further correlation with CT scan of the chest is recommended. IMPRESSION: Right hilar prominence again noted. Again noted streaky bilateral basilar atelectasis or scarring. No segmental infiltrate or pulmonary edema. Degenerative changes mid and lower thoracic spine. There is nodular density in left upper lobe measures 7.5 mm. Second nodular density in left upper lobe measures 4 mm. Further correlation with CT scan of the chest is recommended. Electronically Signed   By: Lahoma Crocker M.D.   On: 12/03/2014 08:44   Ct Chest W Contrast  12/03/2014  CLINICAL DATA:  Abnormal chest radiograph with RIGHT hilar prominence and question pulmonary nodule LEFT upper lobe EXAM: CT CHEST WITH CONTRAST TECHNIQUE: Multidetector CT imaging of the chest was performed during intravenous contrast administration. Sagittal and coronal MPR images reconstructed from axial data set. CONTRAST:  11mL OMNIPAQUE IOHEXOL 300 MG/ML  SOLN tiny COMPARISON:  Chest CT 02/27/2004; Correlation made with chest radiograph 12/03/2014 FINDINGS: Minimal atherosclerotic calcification aorta and coronary arteries. Tortuous great vessels. Vascular structures grossly patent on nondedicated exam. Prominence of RIGHT hilum is due to prominent RIGHT pulmonary artery course. No thoracic adenopathy. Visualized upper abdomen normal. Scattered subsegmental atelectasis bilaterally, chronic. Scattered subpleural interstitial changes, chronic. Minimal central peribronchial thickening with underlying emphysematous changes. 4 mm RIGHT upper lobe nodule image 21 unchanged. No acute infiltrate, pleural effusion, pneumothorax or additional mass/nodule. Old  healed mid sternal fracture. Degenerative disc disease changes thoracic spine. IMPRESSION: Chronic lung changes as above. Prominent RIGHT pulmonary artery accounting for RIGHT hilar enlargement. No acute abnormalities. Electronically Signed   By: Lavonia Dana M.D.   On: 12/03/2014 10:43    Admission HPI:  Khadejah Son is a 13-year-woman with past medical history of hypertension, hyperlipidemia, non-insulin dependent Type 2 DM, CAD s/p DES, TIA, Gold Stage 1 COPD, diastolic dysfunction, hypothyroidism on replacement, diverticulosis, depression, OA, history of tobacco abuse, and GERD who presents with shortness of breath, wheezing, chest tightness, and yellow-productive cough.  She reports feeling her normal self until Saturday (two days ago) when she began having shortness of breath, wheezing, yellow-productive cough, chills, subjective fever, and chest tightness requiring to use her albuterol inhaler three times daily which she normally uses infrequently. She reports being compliant with her home advair and spiriva daily. She used cough syrup at home with no relief. She has history of  Gold Stage 1 COPD with last PFT on 07/18/11 that revealed FEV/FVC of 78% and FEV1 of 86%. She is not on home oxygen and has never been intubated. She denies recent flare in many years with no recent antibiotic or corticosteroid use. She denies recent sick contact and has not had flu shot this year but is vaccinated against pneumococcal disease with PSV23. She is a former tobacco user and quit in 1976. She denies LE edema/pain or history of DVT/PE. She has history of diastolic dysfunction with last 2D-echo on 08/22/08 with grade 1 diastolic dysfunction and recent Myoview on 10/31/14 with EF 55% that was low risk with no reversible ischemia. She has mild sore throat, right sided headache, and mild lightheadedness but denies rhinorrhea, palpitations, diaphoresis, change in vision, abdominal pain, nausea, vomiting, change in  BM/urination, or joint pain from baseline. Per family member at bedside she at her normal mental status.   Hospital Course by problem list:   COPD Exacerbation Admitted with pleuritic chest pain due to cough, dyspnea, and thick sputum production that rapidly improved with nebulizer treatment and IV methylprednisolone. By hospital day 1 she continued good oxygen saturation and no dyspnea at rest, but was noted to desaturate to 84% with ambulation on room air. She was treated with PO prednisone and doxycycline and continued improving throughout the day. She was discharged with home O2 for use with activity or exertion and close follow up scheduled at [facility]. Sputum culture non-expectorated noted positive for gram positive cocci and rods, likely mostly oral flora but is discharged on continued doxycyline course as well.  Discharge Vitals:   BP 115/55 mmHg  Pulse 68  Temp(Src) 98.5 F (36.9 C) (Oral)  Resp 18  Ht 5\' 8"  (1.727 m)  Wt 94.303 kg (207 lb 14.4 oz)  BMI 31.62 kg/m2  SpO2 96%  Discharge Labs:  Results for orders placed or performed during the hospital encounter of 12/03/14 (from the past 24 hour(s))  Glucose, capillary     Status: Abnormal   Collection Time: 12/03/14  4:11 PM  Result Value Ref Range   Glucose-Capillary 327 (H) 65 - 99 mg/dL   Comment 1 Notify RN   Culture, expectorated sputum-assessment     Status: None   Collection Time: 12/03/14  5:45 PM  Result Value Ref Range   Specimen Description SPUTUM    Special Requests Normal    Sputum evaluation      THIS SPECIMEN IS ACCEPTABLE. RESPIRATORY CULTURE REPORT TO FOLLOW.   Report Status 12/03/2014 FINAL   Culture, respiratory (NON-Expectorated)     Status: None (Preliminary result)   Collection Time: 12/03/14  5:45 PM  Result Value Ref Range   Specimen Description SPUTUM    Special Requests NONE    Gram Stain      ABUNDANT WBC PRESENT,BOTH PMN AND MONONUCLEAR RARE SQUAMOUS EPITHELIAL CELLS PRESENT ABUNDANT GRAM  POSITIVE COCCI IN PAIRS IN CLUSTERS MODERATE GRAM POSITIVE RODS Performed at [facility]    Culture      NO GROWTH 1 DAY Performed at [facility]    Report Status PENDING   Glucose, capillary     Status: Abnormal   Collection Time: 12/03/14  9:08 PM  Result Value Ref Range   Glucose-Capillary 295 (H) 65 - 99 mg/dL  CBC with Differential/Platelet     Status: Abnormal   Collection Time: 12/04/14  4:52 AM  Result Value Ref Range   WBC 11.6 (H) 4.0 - 10.5 K/uL   RBC  4.12 3.87 - 5.11 MIL/uL   Hemoglobin 12.2 12.0 - 15.0 g/dL   HCT 35.8 (L) 36.0 - 46.0 %   MCV 86.9 78.0 - 100.0 fL   MCH 29.6 26.0 - 34.0 pg   MCHC 34.1 30.0 - 36.0 g/dL   RDW 15.1 11.5 - 15.5 %   Platelets 176 150 - 400 K/uL   Neutrophils Relative % 88 %   Neutro Abs 10.2 (H) 1.7 - 7.7 K/uL   Lymphocytes Relative 6 %   Lymphs Abs 0.7 0.7 - 4.0 K/uL   Monocytes Relative 6 %   Monocytes Absolute 0.7 0.1 - 1.0 K/uL   Eosinophils Relative 0 %   Eosinophils Absolute 0.0 0.0 - 0.7 K/uL   Basophils Relative 0 %   Basophils Absolute 0.0 0.0 - 0.1 K/uL  Basic metabolic panel     Status: Abnormal   Collection Time: 12/04/14  4:52 AM  Result Value Ref Range   Sodium 134 (L) 135 - 145 mmol/L   Potassium 3.8 3.5 - 5.1 mmol/L   Chloride 100 (L) 101 - 111 mmol/L   CO2 25 22 - 32 mmol/L   Glucose, Bld 304 (H) 65 - 99 mg/dL   BUN 15 6 - 20 mg/dL   Creatinine, Ser 1.01 (H) 0.44 - 1.00 mg/dL   Calcium 9.8 8.9 - 10.3 mg/dL   GFR calc non Af Amer 52 (L) >60 mL/min   GFR calc Af Amer >60 >60 mL/min   Anion gap 9 5 - 15  Glucose, capillary     Status: Abnormal   Collection Time: 12/04/14  6:12 AM  Result Value Ref Range   Glucose-Capillary 292 (H) 65 - 99 mg/dL    Signed: Collier Salina, MD 12/04/2014, 2:20 PM    Equipment Ordered on Discharge: Home Oxygen

## 2014-12-04 NOTE — Progress Notes (Signed)
  Date: 12/04/2014  Patient name: Caitlyn Aguirre  Medical record number: [id number]  Date of birth: 09-10-1935   This patient's plan of care was discussed with the house staff. Please see their note for complete details. I concur with their findings.  1. COPD  She has improved, appreciate PT eval for home O2.   Home with steroid taper.   Short course of doxy.   Pulm rehab eval as outpt  2. Chest pain  On questioning, this is peluritic.   She had recent CV imaging, negative. (myoview 9-28)   Campbell Riches, MD 12/04/2014, 12:17 PM

## 2014-12-04 NOTE — Patient Outreach (Signed)
[facility]) Care Management  12/04/2014  Caitlyn Aguirre July 16, 1935 [id number]   Referral from Natividad Brood, RN to assign Community RN, assigned [name], RN.  Thanks, [name]. [place], [place] Assistant Phone: [phone] Fax: [phone]

## 2014-12-04 NOTE — Progress Notes (Addendum)
Subjective: Patient feeling improved from overnight, now with thinner sputum produced on cough. Does have ongoing pleuritic chest pain worsened by coughing. No more chills since early yesterday. She ambulated on room air and was noted to desaturate to 84%.  Objective: Vital signs in last 24 hours: Filed Vitals:   12/04/14 0759 12/04/14 0949 12/04/14 1203 12/04/14 1247  BP: 147/81  115/55   Pulse: 73 74 68   Temp: 98.5 F (36.9 C)     TempSrc: Oral     Resp: 18 16 18    Height:      Weight:      SpO2: 96% 98% 93% 96%   Weight change:   Intake/Output Summary (Last 24 hours) at 12/04/14 1338 Last data filed at 12/04/14 1336  Gross per 24 hour  Intake   1140 ml  Output   1100 ml  Net     40 ml   GENERAL- alert, co-operative, NAD HEENT- oral mucosa appears moist, no cervical LN enlargement. CARDIAC- RRR, no murmurs, rubs or gallops. RESP- No wheezes, faint bilateral basilar inspiratory crackles ABDOMEN- Soft, nontender, no guarding or rebound EXTREMITIES- symmetric, no pedal edema. SKIN- Warm, dry, No rash or lesion. PSYCH- Normal mood and affect, appropriate thought content and speech.  Lab Results: Basic Metabolic Panel:  Recent Labs Lab 12/03/14 0823 12/03/14 1102 12/04/14 0452  NA 139  --  134*  K 3.7  --  3.8  CL 106  --  100*  CO2 23  --  25  GLUCOSE 197*  --  304*  BUN 9  --  15  CREATININE 0.77  --  1.01*  CALCIUM 10.2  --  9.8  MG  --  1.5*  --   PHOS  --  2.9  --    Liver Function Tests:  Recent Labs Lab 12/03/14 1102  AST 16  ALT 11*  ALKPHOS 113  BILITOT 1.0  PROT 7.1  ALBUMIN 3.7   No results for input(s): LIPASE, AMYLASE in the last 168 hours. No results for input(s): AMMONIA in the last 168 hours. CBC:  Recent Labs Lab 12/03/14 0823 12/04/14 0452  WBC 12.1* 11.6*  NEUTROABS 9.3* 10.2*  HGB 12.8 12.2  HCT 37.8 35.8*  MCV 87.3 86.9  PLT 158 176   Cardiac Enzymes: No results for input(s): CKTOTAL, CKMB, CKMBINDEX, TROPONINI  in the last 168 hours. BNP: No results for input(s): PROBNP in the last 168 hours. D-Dimer: No results for input(s): DDIMER in the last 168 hours. CBG:  Recent Labs Lab 12/03/14 1113 12/03/14 1611 12/03/14 2108 12/04/14 0612  GLUCAP 180* 327* 295* 292*   Hemoglobin A1C: No results for input(s): HGBA1C in the last 168 hours. Fasting Lipid Panel: No results for input(s): CHOL, HDL, LDLCALC, TRIG, CHOLHDL, LDLDIRECT in the last 168 hours. Thyroid Function Tests:  Recent Labs Lab 12/03/14 1100  TSH 1.266   Coagulation:  Recent Labs Lab 12/03/14 1102  LABPROT 13.9  INR 1.05   Anemia Panel: No results for input(s): VITAMINB12, FOLATE, FERRITIN, TIBC, IRON, RETICCTPCT in the last 168 hours. Urine Drug Screen: Drugs of Abuse  No results found for: LABOPIA, COCAINSCRNUR, LABBENZ, AMPHETMU, THCU, LABBARB  Alcohol Level: No results for input(s): ETH in the last 168 hours. Urinalysis: No results for input(s): COLORURINE, LABSPEC, PHURINE, GLUCOSEU, HGBUR, BILIRUBINUR, KETONESUR, PROTEINUR, UROBILINOGEN, NITRITE, LEUKOCYTESUR in the last 168 hours.  Invalid input(s): APPERANCEUR Misc. Labs:   Micro Results: Recent Results (from the past 240 hour(s))  MRSA PCR Screening  Status: None   Collection Time: 12/03/14  1:11 PM  Result Value Ref Range Status   MRSA by PCR NEGATIVE NEGATIVE Final    Comment:        The GeneXpert MRSA Assay (FDA approved for NASAL specimens only), is one component of a comprehensive MRSA colonization surveillance program. It is not intended to diagnose MRSA infection nor to guide or monitor treatment for MRSA infections.   Culture, expectorated sputum-assessment     Status: None   Collection Time: 12/03/14  5:45 PM  Result Value Ref Range Status   Specimen Description SPUTUM  Final   Special Requests Normal  Final   Sputum evaluation   Final    THIS SPECIMEN IS ACCEPTABLE. RESPIRATORY CULTURE REPORT TO FOLLOW.   Report Status  12/03/2014 FINAL  Final  Culture, respiratory (NON-Expectorated)     Status: None (Preliminary result)   Collection Time: 12/03/14  5:45 PM  Result Value Ref Range Status   Specimen Description SPUTUM  Final   Special Requests NONE  Final   Gram Stain   Final    ABUNDANT WBC PRESENT,BOTH PMN AND MONONUCLEAR RARE SQUAMOUS EPITHELIAL CELLS PRESENT ABUNDANT GRAM POSITIVE COCCI IN PAIRS IN CLUSTERS MODERATE GRAM POSITIVE RODS Performed at [facility]    Culture   Final    NO GROWTH 1 DAY Performed at [facility]    Report Status PENDING  Incomplete   Studies/Results: Dg Chest 2 View  12/03/2014  CLINICAL DATA:  Headache, history of pneumonia, cough EXAM: CHEST  2 VIEW COMPARISON:  07/20/2014 and 03/23/2014 FINDINGS: Cardiomediastinal silhouette is stable. Right hilar prominence again noted. Again noted streaky bilateral basilar atelectasis or scarring. No segmental infiltrate or pulmonary edema. Degenerative changes mid and lower thoracic spine. There is nodular density in left upper lobe measures 7.5 mm. Second nodular density in left upper lobe measures 4 mm. Further correlation with CT scan of the chest is recommended. IMPRESSION: Right hilar prominence again noted. Again noted streaky bilateral basilar atelectasis or scarring. No segmental infiltrate or pulmonary edema. Degenerative changes mid and lower thoracic spine. There is nodular density in left upper lobe measures 7.5 mm. Second nodular density in left upper lobe measures 4 mm. Further correlation with CT scan of the chest is recommended. Electronically Signed   By: Lahoma Crocker M.D.   On: 12/03/2014 08:44   Ct Chest W Contrast  12/03/2014  CLINICAL DATA:  Abnormal chest radiograph with RIGHT hilar prominence and question pulmonary nodule LEFT upper lobe EXAM: CT CHEST WITH CONTRAST TECHNIQUE: Multidetector CT imaging of the chest was performed during intravenous contrast administration. Sagittal and coronal MPR  images reconstructed from axial data set. CONTRAST:  79mL OMNIPAQUE IOHEXOL 300 MG/ML  SOLN tiny COMPARISON:  Chest CT 02/27/2004; Correlation made with chest radiograph 12/03/2014 FINDINGS: Minimal atherosclerotic calcification aorta and coronary arteries. Tortuous great vessels. Vascular structures grossly patent on nondedicated exam. Prominence of RIGHT hilum is due to prominent RIGHT pulmonary artery course. No thoracic adenopathy. Visualized upper abdomen normal. Scattered subsegmental atelectasis bilaterally, chronic. Scattered subpleural interstitial changes, chronic. Minimal central peribronchial thickening with underlying emphysematous changes. 4 mm RIGHT upper lobe nodule image 21 unchanged. No acute infiltrate, pleural effusion, pneumothorax or additional mass/nodule. Old healed mid sternal fracture. Degenerative disc disease changes thoracic spine. IMPRESSION: Chronic lung changes as above. Prominent RIGHT pulmonary artery accounting for RIGHT hilar enlargement. No acute abnormalities. Electronically Signed   By: Lavonia Dana M.D.   On: 12/03/2014 10:43  Medications: I have reviewed the patient's current medications. Scheduled Meds: . amLODipine  5 mg Oral Daily  . aspirin EC  81 mg Oral Daily  . atorvastatin  40 mg Oral q1800  . carvedilol  25 mg Oral BID WC  . doxycycline  100 mg Oral Q12H  . enoxaparin (LOVENOX) injection  40 mg Subcutaneous Q24H  . insulin aspart  0-15 Units Subcutaneous TID WC  . ipratropium-albuterol  3 mL Nebulization Q4H WA  . levothyroxine  88 mcg Oral QAC breakfast  . lisinopril  20 mg Oral Daily  . mometasone-formoterol  2 puff Inhalation BID  . pantoprazole  40 mg Oral Daily  . PARoxetine  30 mg Oral Daily  . predniSONE  40 mg Oral Q breakfast  . senna-docusate  1 tablet Oral Daily  . sodium chloride  3 mL Intravenous Q12H   Continuous Infusions:  PRN Meds:.acetaminophen **OR** acetaminophen, albuterol, HYDROcodone-acetaminophen, Influenza vac split  quadrivalent PF Assessment/Plan: COPD Exacerbation - Etiology of exacerbation unclear possibly due to viral URI but was noted to have poor technique using her daily inhaler correctly at recent clinic visit on 11/12/14. Improving very quickly with Iv glucocorticoids, abx, and continued inhaler therapy. Desaturation on ambulation likely to improve more with ongoing treatment of exacerbation, but will need supportive O2 therapy to minimize cardiovascular strain in the meantime. -Oxygen therapy to keep SpO2 >88% -Will continue following up sputum cultures -Change to Prednisone 40mg  -Continue duonebs Q 4 hr while awake -Continue dulera BID (at home on advair) -Transition doxycyline 100 mg BID to PO -Will arrange for outpt O2 therapy -Pt needs education on inhaler use, to be reinforced at clinic F/U -Administer influenza vaccine before discharge   Left Upper Lobe Pulmonary Nodules - No concerning acute findings on chest CT, chronic emphysematous changes present.  Hypertension - Quickly improved, likely some impact on vitals with steroids and beta agonist therapies.  Non-insulin Dependent Type 2 DM - BG 197 on admission. Last A1c 7.7 on 11/12/14. CBGs up to ~300 today now on reduced home dose plus steroids. Will contact clinic diabetes coordinator and pharmacist to address this transient complication. -Will place back to home metformin 1000 mg BID -Start carb modified diet -Start moderate SSI with meals -Monitor CBG's before meals and at bedtime  CAD s/p DES - Currently with pleuritic chest pain durign COPD exacerbation, not anginal symptoms. Pt with recent Myoview on 10/31/14 that was low risk. Initial troponin negative and 12-lead EKG on admission with no ischemic changes.  -Continue aspirin 81 mg daily, carvedilol 25 mg BID, and lisinopril 20 mg daily   Depression -Continue home paxil 30 mg daily   Osteoarthritis -Norco/vicodin 5-325 mg BID PRN pain -Hold home celecoxib 100 mg daily in  setting of IV contrast -PT and OT consults  GERD protonix 40 mg daily  Chronic constipation Continue home senokot-S 1 tablet daily  Diet: Carb modified DVT PPx: Lovenox daily FULL CODE  Dispo: Anticipated discharge later today with continued improving status.   The patient does have a current PCP Damita Dunnings Lorenda Ishihara, MD) and does need an [facility] hospital follow-up appointment after discharge.  The patient does not have transportation limitations that hinder transportation to clinic appointments.    Collier Salina, MD 12/04/2014, 1:38 PM

## 2014-12-04 NOTE — Evaluation (Signed)
Occupational Therapy Evaluation Patient Details Name: Caitlyn Aguirre MRN: [id number] DOB: 1935/11/16 Today's Date: 12/04/2014    History of Present Illness Caitlyn Aguirre is a 89-year-woman with past medical history of hypertension, hyperlipidemia, non-insulin dependent Type 2 DM, CAD s/p DES, TIA, Gold Stage 1 COPD, diastolic dysfunction, hypothyroidism on replacement, diverticulosis, depression, OA, history of tobacco abuse, and GERD who presents with shortness of breath, wheezing, chest tightness, and yellow-productive cough   Clinical Impression   Patient evaluated by Occupational Therapy with no further acute OT needs identified. All education has been completed and the patient has no further questions. Pt is modified independent with ADLs.   See below for any follow-up Occupational Therapy or equipment needs. OT is signing off. Thank you for this referral.      Follow Up Recommendations  No OT follow up    Equipment Recommendations  None recommended by OT    Recommendations for Other Services       Precautions / Restrictions Precautions Precautions: None      Mobility Bed Mobility Overal bed mobility: Independent                Transfers Overall transfer level: Independent                    Balance Overall balance assessment: No apparent balance deficits (not formally assessed)                                          ADL Overall ADL's : Modified independent                                             Vision     Perception     Praxis      Pertinent Vitals/Pain Pain Assessment: No/denies pain     Hand Dominance Right   Extremity/Trunk Assessment Upper Extremity Assessment Upper Extremity Assessment: Overall WFL for tasks assessed   Lower Extremity Assessment Lower Extremity Assessment: Defer to PT evaluation   Cervical / Trunk Assessment Cervical / Trunk Assessment: Normal   Communication  Communication Communication: No difficulties   Cognition Arousal/Alertness: Awake/alert Behavior During Therapy: WFL for tasks assessed/performed Overall Cognitive Status: Within Functional Limits for tasks assessed                     General Comments       Exercises       Shoulder Instructions      Home Living Family/patient expects to be discharged to:: Private residence Living Arrangements: Children Available Help at Discharge: Family;Available 24 hours/day Type of Home: House Home Access: Stairs to enter [employer] of Steps: 8 Entrance Stairs-Rails: Right;Left Home Layout: One level     Bathroom Shower/Tub: Tub/shower unit Shower/tub characteristics: Architectural technologist: Standard     Home Equipment: Grab bars - tub/shower;Toilet riser          Prior Functioning/Environment Level of Independence: Independent        Comments: Pt takes tub baths     OT Diagnosis:     OT Problem List:     OT Treatment/Interventions:      OT Goals(Current goals can be found in the care plan section) Acute Rehab OT Goals OT Goal Formulation:  All assessment and education complete, DC therapy  OT Frequency:     Barriers to D/C:            Co-evaluation              End of Session Nurse Communication: Mobility status  Activity Tolerance: Patient tolerated treatment well Patient left: in bed;with call bell/phone within reach   Time: 1523-1540 OT Time Calculation (min): 17 min Charges:  OT General Charges $OT Visit: 1 Procedure OT Evaluation $Initial OT Evaluation Tier I: 1 Procedure G-Codes:    Caitlyn Aguirre M December 17, 2014, 3:46 PM

## 2014-12-04 NOTE — Progress Notes (Signed)
SATURATION QUALIFICATIONS: (This note is used to comply with regulatory documentation for home oxygen)  Patient Saturations on Room Air at Rest = 91%  Patient Saturations on Room Air while Ambulating = 84%  Patient Saturations on 2 Liters of oxygen while Ambulating = 91%  Please briefly explain why patient needs home oxygen: Oxygen level drops with ambulation.  [place], [place] 12/04/2014

## 2014-12-04 NOTE — Discharge Instructions (Signed)
You are being sent home with the following plan:  - Doxycycline 100mg - 1 tablet two times per day for 5 days  - Prednisone 20mg - Take 2 tablets once per day for  5 days, then take 1 tablet once per day for 5 days  - Oxygen: Use oxygen at 2 liters per minute while walking and active to reduce burden on heart and lungs  - Follow up at your clinic appointment on Friday November 4 at 3:45pm, and discuss pulmonary rehab at that time.

## 2014-12-04 NOTE — Progress Notes (Signed)
[facility] called for home oxygen to be delivered to the patient's room today prior to discharging home. Mindi Slicker [facility] [phone]

## 2014-12-04 NOTE — Evaluation (Signed)
Physical Therapy Evaluation Patient Details Name: Caitlyn Aguirre MRN: [id number] DOB: 1935/03/15 Today's Date: 12/04/2014   History of Present Illness  Caitlyn Aguirre is a 5-year-woman with past medical history of hypertension, hyperlipidemia, non-insulin dependent Type 2 DM, CAD s/p DES, TIA, Gold Stage 1 COPD, diastolic dysfunction, hypothyroidism on replacement, diverticulosis, depression, OA, history of tobacco abuse, and GERD who presents with shortness of breath, wheezing, chest tightness, and yellow-productive cough  Clinical Impression  Patient presents at independent level for mobility, but demonstrated decreased O2 saturations ambulating on room air, improved with ambulation on 2L O2.  Educated patient possible need for home O2 to reduce strain on cardiovascular system.  Also educated about possibility of pulmonary rehab to improve cardiovascular reserve/endurance.      Follow Up Recommendations Other (comment) (MD note regarding pulmonary rehab in chart, discussed with patient)    Equipment Recommendations  None recommended by PT    Recommendations for Other Services       Precautions / Restrictions Precautions Precautions: None      Mobility  Bed Mobility Overal bed mobility: Independent                Transfers Overall transfer level: Independent                  Ambulation/Gait Ambulation/Gait assistance: Independent Ambulation Distance (Feet): 160 Feet (x 2) Assistive device: None Gait Pattern/deviations: Step-through pattern;WFL(Within Functional Limits)     General Gait Details: Patient able to walk and side step and back up appropriately without LOB while managing around obstacles including IV pole.  Stairs            Wheelchair Mobility    Modified Rankin (Stroke Patients Only)       Balance Overall balance assessment: No apparent balance deficits (not formally assessed)                                            Pertinent Vitals/Pain Pain Assessment: No/denies pain    Home Living Family/patient expects to be discharged to:: Private residence Living Arrangements: Children Available Help at Discharge: Family;Available 24 hours/day Type of Home: House Home Access: Stairs to enter Entrance Stairs-Rails: Psychiatric nurse of Steps: 8 Home Layout: One level Home Equipment: Grab bars - tub/shower;Toilet riser      Prior Function Level of Independence: Independent               Hand Dominance        Extremity/Trunk Assessment   Upper Extremity Assessment: Overall WFL for tasks assessed           Lower Extremity Assessment: Overall WFL for tasks assessed         Communication   Communication: No difficulties  Cognition Arousal/Alertness: Awake/alert Behavior During Therapy: WFL for tasks assessed/performed Overall Cognitive Status: Within Functional Limits for tasks assessed                      General Comments General comments (skin integrity, edema, etc.): Demonstrated decreased O2 sats ambulating on room air    Exercises        Assessment/Plan    PT Assessment Patent does not need any further PT services  PT Diagnosis Generalized weakness   PT Problem List    PT Treatment Interventions     PT Goals (Current goals can be found  in the Care Plan section) Acute Rehab PT Goals PT Goal Formulation: All assessment and education complete, DC therapy    Frequency     Barriers to discharge        Co-evaluation               End of Session Equipment Utilized During Treatment: Oxygen Activity Tolerance: Patient tolerated treatment well Patient left: in bed;with call bell/phone within reach;with family/visitor present      Functional Assessment Tool Used: Clinical Judgement Functional Limitation: Mobility: Walking and moving around Mobility: Walking and Moving Around Current Status [id number]): 0 percent impaired, limited or  restricted Mobility: Walking and Moving Around Goal Status [id number]): 0 percent impaired, limited or restricted Mobility: Walking and Moving Around Discharge Status [phone]): 0 percent impaired, limited or restricted    Time: 1105-1130 PT Time Calculation (min) (ACUTE ONLY): 25 min   Charges:   PT Evaluation $Initial PT Evaluation Tier I: 1 Procedure PT Treatments $Self Care/Home Management: 8-22   PT G Codes:   PT G-Codes **NOT FOR INPATIENT CLASS** Functional Assessment Tool Used: Clinical Judgement Functional Limitation: Mobility: Walking and moving around Mobility: Walking and Moving Around Current Status (W5462): 0 percent impaired, limited or restricted Mobility: Walking and Moving Around Goal Status (V0350): 0 percent impaired, limited or restricted Mobility: Walking and Moving Around Discharge Status (K9381): 0 percent impaired, limited or restricted    [facility] 12/04/2014, 1:12 PM  [place], [place] 12/04/2014

## 2014-12-05 ENCOUNTER — Other Ambulatory Visit: Payer: Self-pay

## 2014-12-05 LAB — CULTURE, RESPIRATORY W GRAM STAIN: Culture: NORMAL

## 2014-12-05 NOTE — Patient Outreach (Signed)
[facility]) Care Management  12/05/2014  ALIZA MORET 1935-04-11 [id number]   Assessment: 79 year old with admission 10/31-11/1 with COPD exacerbation. RNCM called member for transition of care call. No answer. HIPPA compliant message left.  Plan: Follow up call tomorrow.  [name], RN, MSN, [place] Coordinator Cell: [phone]

## 2014-12-05 NOTE — Progress Notes (Signed)
Occupational Therapy Evaluation    12-08-2014 1500  OT G-codes **NOT FOR INPATIENT CLASS**  Functional Limitation Self care  Self Care Current Status [phone]) CI  Self Care Goal Status (F9012) CI  Self Care Discharge Status [phone]) CI  Lucille Passy, OTR/L [phone]

## 2014-12-06 ENCOUNTER — Other Ambulatory Visit: Payer: Medicare Other

## 2014-12-06 NOTE — Progress Notes (Signed)
This encounter was created in error - please disregard.

## 2014-12-06 NOTE — Patient Outreach (Signed)
[facility]) Care Management  12/06/2014  AVENELL SELLERS 1935/11/07 [id number]   Assessment: 79 year old with recent admission 10/31-11/1 for COPD exacerbation. Member reports feeling better. Member was not home, however member agreed to complete transition of care call. Denies difficulty breathing, states she still has some cough, but she is not bringing up phlegm like she was. Per member has oxygen at 2 liters/nasal cannula with activity/while walking. Member denies any issues with breathing at this time.   Medications reviewed. Member acknowledges she has two inhalers. Three inhalers including rescue inhaler on her profile. Member questions which inhaler is the rescue inhaler stating she was not home at the time and could not get them to read the label. But confirms she has not needed to use a rescue inhaler.  Follow up appointment-Member has follow up appointment scheduled for tomorrow and states she has transportation to her appointment.  Member states her main issue is that she is not able to sleep-took tylenol pm last night and it did not help. Member states she will discuss with provider at her office visit tomorrow.  Plan: Home visit next week.  [name], RN, MSN, [place] Coordinator Cell: [phone]

## 2014-12-07 ENCOUNTER — Encounter: Payer: Self-pay | Admitting: Internal Medicine

## 2014-12-07 ENCOUNTER — Ambulatory Visit (INDEPENDENT_AMBULATORY_CARE_PROVIDER_SITE_OTHER): Payer: Medicare Other | Admitting: Internal Medicine

## 2014-12-07 VITALS — BP 142/81 | HR 78 | Temp 98.4°F | Ht 68.0 in | Wt 210.6 lb

## 2014-12-07 DIAGNOSIS — J449 Chronic obstructive pulmonary disease, unspecified: Secondary | ICD-10-CM

## 2014-12-07 DIAGNOSIS — F32A Depression, unspecified: Secondary | ICD-10-CM

## 2014-12-07 DIAGNOSIS — Z7951 Long term (current) use of inhaled steroids: Secondary | ICD-10-CM | POA: Diagnosis not present

## 2014-12-07 DIAGNOSIS — G4733 Obstructive sleep apnea (adult) (pediatric): Secondary | ICD-10-CM

## 2014-12-07 DIAGNOSIS — I10 Essential (primary) hypertension: Secondary | ICD-10-CM

## 2014-12-07 DIAGNOSIS — J438 Other emphysema: Secondary | ICD-10-CM

## 2014-12-07 DIAGNOSIS — E139 Other specified diabetes mellitus without complications: Secondary | ICD-10-CM

## 2014-12-07 DIAGNOSIS — Z9981 Dependence on supplemental oxygen: Secondary | ICD-10-CM

## 2014-12-07 DIAGNOSIS — Z7984 Long term (current) use of oral hypoglycemic drugs: Secondary | ICD-10-CM

## 2014-12-07 DIAGNOSIS — E039 Hypothyroidism, unspecified: Secondary | ICD-10-CM

## 2014-12-07 DIAGNOSIS — E119 Type 2 diabetes mellitus without complications: Secondary | ICD-10-CM

## 2014-12-07 DIAGNOSIS — F329 Major depressive disorder, single episode, unspecified: Secondary | ICD-10-CM

## 2014-12-07 LAB — GLUCOSE, CAPILLARY: Glucose-Capillary: 337 mg/dL — ABNORMAL HIGH (ref 65–99)

## 2014-12-07 MED ORDER — PAROXETINE HCL 30 MG PO TABS: 30.0000 mg | ORAL_TABLET | Freq: Every day | ORAL | Status: DC

## 2014-12-07 MED ORDER — TIOTROPIUM BROMIDE MONOHYDRATE 18 MCG IN CAPS: ORAL_CAPSULE | RESPIRATORY_TRACT | Status: DC

## 2014-12-07 MED ORDER — BENZONATATE 100 MG PO CAPS: 100.0000 mg | ORAL_CAPSULE | Freq: Three times a day (TID) | ORAL | Status: DC | PRN

## 2014-12-07 MED ORDER — LEVOTHYROXINE SODIUM 88 MCG PO TABS: ORAL_TABLET | ORAL | Status: DC

## 2014-12-07 MED ORDER — ATORVASTATIN CALCIUM 40 MG PO TABS: ORAL_TABLET | ORAL | Status: DC

## 2014-12-07 MED ORDER — AMLODIPINE BESYLATE 5 MG PO TABS: 5.0000 mg | ORAL_TABLET | Freq: Every day | ORAL | Status: DC

## 2014-12-07 MED ORDER — METFORMIN HCL 1000 MG PO TABS: 1000.0000 mg | ORAL_TABLET | Freq: Two times a day (BID) | ORAL | Status: DC

## 2014-12-07 MED ORDER — LISINOPRIL 20 MG PO TABS: ORAL_TABLET | ORAL | Status: DC

## 2014-12-07 MED ORDER — CARVEDILOL 25 MG PO TABS: 25.0000 mg | ORAL_TABLET | Freq: Two times a day (BID) | ORAL | Status: DC

## 2014-12-07 NOTE — Assessment & Plan Note (Signed)
Lab Results  Component Value Date   HGBA1C 7.7 11/12/2014   HGBA1C 7.0* 07/21/2014   HGBA1C 7.4* 03/23/2014     Assessment: Diabetes control:  well-controlled Progress toward A1C goal:   near goal Comments: on metformin 1000mg  BID  Plan: Medications:  continue current medications Home glucose monitoring: Frequency:   Timing:   Instruction/counseling given: reminded to bring medications to each visit and discussed diet Educational resources provided:   Self management tools provided:   Other plans:

## 2014-12-07 NOTE — Assessment & Plan Note (Signed)
She is adherent to her inhalers, and only uses her rescue albuterol intermittently. She only occasionally uses the supplemental O2. Currently, her symptoms are well-controlled after discharge. -Counseled patient on appropriate O2 use and benefits

## 2014-12-07 NOTE — Assessment & Plan Note (Signed)
Patient says that despite her severe OSA per sleep study 12/2011, she has never obtained or used a CPAP due to it previously being prohibitively expensive. She does now have insurance with [employer], however, and seems agreeable to trying the CPAP if she is able to afford it. Given the severity of her OSA and the mild nature of her COPD, we suspect that at least some portion of her respiratory symptoms may be due to OSA, and possible pulmonary HTN from it. -Will follow-up with updated costs of CPAP and encourage use

## 2014-12-07 NOTE — Progress Notes (Signed)
O2 sat 86 % while ambulating.

## 2014-12-07 NOTE — Progress Notes (Signed)
   Patient ID: Caitlyn Aguirre female   DOB: Feb 07, 1935 79 y.o.   MRN: [id number]  Subjective:   HPI: [name] is a 79 y.o. with PMH of hypertension, hyperlipidemia, non-insulin dependent Type 2 DM, CAD s/p DES, TIA, Gold Stage 1 COPD, diastolic dysfunction, hypothyroidism on replacement, diverticulosis, depression, OA, history of tobacco abuse, and GERD who presents to [facility] today for hospital follow-up after discharge on 11/1 for COPD exacerbation. She says she is feeling much better since discharge, and is breathing much better with minimal SOB a mild cough. She has been compliant with all of her COPD medications. She is using the supplemental O2 only at night when she is home, but otherwise has not been using it. She has no other issues at this time. She denies fever, malaise, weight changes, chest pain, palpitations, nausea, abdominal pain, urinary or bowel changes, leg swelling, or any other symptoms at this time.  Please see problem-based charting for further pertinent information.  Review of Systems: Pertinent items noted in HPI and remainder of comprehensive ROS otherwise negative.  Objective:  Physical Exam: Filed Vitals:   12/07/14 1531  BP: 142/81  Pulse: 78  Temp: 98.4 F (36.9 C)  TempSrc: Oral  Height: 5\' 8"  (1.727 m)  Weight: 210 lb 9.6 oz (95.528 kg)  SpO2: 96%   Gen: Well-appearing, alert and oriented to person, place, and time HEENT: Oropharynx clear without erythema or exudate.  Neck: No cervical LAD, no thyromegaly or nodules, no JVD noted. CV: Normal rate, regular rhythm, no murmurs, rubs, or gallops Pulmonary: Normal effort, CTA bilaterally, no wheezes. Mild coarse breath sounds at the bilateral lung bases. Abdominal: Soft, non-tender, non-distended, without rebound, guarding, or masses Extremities: Distal pulses 2+ in upper and lower extremities bilaterally, no tenderness, erythema or edema Skin: No atypical appearing moles. No rashes  Assessment & Plan:   Please see problem-based charting for assessment and plan.  Blane Ohara, MD Resident Physician, PGY-1 Department of Internal Medicine [facility]

## 2014-12-07 NOTE — Assessment & Plan Note (Signed)
BP Readings from Last 3 Encounters:  12/07/14 142/81  12/04/14 115/55  11/15/14 144/83    Lab Results  Component Value Date   NA 134* 12/04/2014   K 3.8 12/04/2014   CREATININE 1.01* 12/04/2014    Assessment: Blood pressure control:  controlled Progress toward BP goal:   at goal Comments: on lisinopril 20, carvedilol 25 BID, amlodipine 5mg   Plan: Medications:  continue current medications Educational resources provided:   Self management tools provided:   Other plans:

## 2014-12-09 NOTE — Progress Notes (Signed)
Internal Medicine Clinic Attending  I saw and evaluated the patient.  I personally confirmed the key portions of the history and exam documented by Dr. Merrilyn Puma and I reviewed pertinent patient test results.  The assessment, diagnosis, and plan were formulated together and I agree with the documentation in the resident's note.  Ms. Olesky is stable since discharge from respiratory failure. She continues to require supplemental oxygen for any activity. Her COPD is only mild with Gold stage 1, no wheezing on exam today. I think the hypoxia is more likely coming from severe OSA which is not being treated, likely leading to some amount of pulmonary hypertension. Sleep study was done in 2013 with severe apnea, but she was not able to afford CPAP then. I have put in a new referral for a CPAP machine which I think is essential for her respiratory status.

## 2014-12-12 ENCOUNTER — Other Ambulatory Visit: Payer: Self-pay

## 2014-12-12 ENCOUNTER — Telehealth: Payer: Self-pay | Admitting: Student in an Organized Health Care Education/Training Program

## 2014-12-12 VITALS — BP 158/78 | HR 74 | Resp 20 | Ht 68.0 in | Wt 210.0 lb

## 2014-12-12 DIAGNOSIS — I10 Essential (primary) hypertension: Secondary | ICD-10-CM

## 2014-12-12 DIAGNOSIS — J441 Chronic obstructive pulmonary disease with (acute) exacerbation: Secondary | ICD-10-CM

## 2014-12-12 DIAGNOSIS — F329 Major depressive disorder, single episode, unspecified: Secondary | ICD-10-CM

## 2014-12-12 DIAGNOSIS — F32A Depression, unspecified: Secondary | ICD-10-CM

## 2014-12-12 NOTE — Telephone Encounter (Signed)
Received call from [place] stating pt is currently out of her lisinopril and paroxetine.  Pt uses a mail order company and medications are scheduled to be delivered around the 18th.  [facility] recommended a short term supply be called into one of the local pharmacies.  [facility] stated that the local pharmacy will get a denial, and will need to contact [facility] directly to walk them through a "mail order override".  Of note, pt's last lisinopril refill 09/07 and last paxil refill 09/09. Refill already in progress, but it will take about 7-10 days to process the order and have it delivered to patient.  Will send request to pt's pcp for review, please advise.[name]11/9/20164:02 PM  Nurse also obtained pt's vitals during home visit  BP 158/78  Pulse 74  Respirations 20 O2 Sat 97%   [facility] ([address])

## 2014-12-12 NOTE — Addendum Note (Signed)
Addended by: Norval Gable on: 12/12/2014 05:05 PM   Modules accepted: Orders

## 2014-12-13 ENCOUNTER — Other Ambulatory Visit: Payer: Self-pay

## 2014-12-13 MED ORDER — LISINOPRIL 20 MG PO TABS: ORAL_TABLET | ORAL | Status: DC

## 2014-12-13 MED ORDER — PAROXETINE HCL 30 MG PO TABS: 30.0000 mg | ORAL_TABLET | Freq: Every day | ORAL | Status: DC

## 2014-12-13 NOTE — Patient Outreach (Signed)
[facility]) Care Management  12/13/2014  Caitlyn Aguirre 12-26-1935 [id number]   Request from [name], RN to assign Pharmacy, assigned [name], PharmD.  Thanks, [name]. [place], [place] Assistant Phone: [phone] Fax: [phone]

## 2014-12-13 NOTE — Patient Outreach (Signed)
[facility]) Care Management  [place]  12/13/2014   Caitlyn Aguirre July 22, 1935 [id number]  Subjective: Member reports breathing is better, denies shortness of breath. Expresses that she is need of obtaining a refill on paroxetine for depression.   Objective: BP 158/78 mmHg  Pulse 74  Resp 20  Ht 1.727 m (_0 )  Wt 210 lb (95.255 kg)  BMI 31.94 kg/m2  SpO2 97% Lungs clear, heart rate regular.  Current Medications:  Current Outpatient Prescriptions  Medication Sig Dispense Refill  . ADVAIR DISKUS 250-50 MCG/DOSE AEPB Use 1 puff two times daily 60 each 5  . albuterol (PROAIR HFA) 108 (90 BASE) MCG/ACT inhaler Inhale 2 puffs into the lungs every 6 (six) hours as needed for wheezing or shortness of breath. Use with Spacer 1 Inhaler 3  . amLODipine (NORVASC) 5 MG tablet Take 1 tablet (5 mg total) by mouth daily. TAKE 1 TABLET BY MOUTH EVERY DAY 90 tablet 3  . aspirin EC 81 MG tablet Take 1 tablet (81 mg total) by mouth daily. 30 tablet 3  . atorvastatin (LIPITOR) 40 MG tablet Take 1 tablet by mouth  daily at 6PM 30 tablet 5  . carvedilol (COREG) 25 MG tablet Take 1 tablet (25 mg total) by mouth 2 (two) times daily with a meal. 180 tablet 3  . celecoxib (CELEBREX) 100 MG capsule Take 1 capsule (100 mg total) by mouth daily. 30 capsule 2  . diphenhydramine-acetaminophen (TYLENOL PM) 25-500 MG TABS tablet Take 1 tablet by mouth at bedtime as needed.     Marland Kitchen glucose blood (ACCU-CHEK SMARTVIEW) test strip Use as instructed 100 each 12  . levothyroxine (SYNTHROID, LEVOTHROID) 88 MCG tablet Take 1 tablet by mouth  daily before breakfast 60 tablet 2  . lisinopril (PRINIVIL,ZESTRIL) 20 MG tablet Take 1 tablet by mouth  daily 30 tablet 5  . metFORMIN (GLUCOPHAGE) 1000 MG tablet Take 1 tablet (1,000 mg total) by mouth 2 (two) times daily with a meal. 180 tablet 3  . omeprazole (PRILOSEC) 20 MG capsule Take 1 capsule by mouth  every day 90 capsule 2  . PARoxetine (PAXIL) 30  MG tablet Take 1 tablet (30 mg total) by mouth daily. 30 tablet 5  . senna-docusate (SENOKOT-S) 8.6-50 MG per tablet Take 1 tablet by mouth daily.     Marland Kitchen tiotropium (SPIRIVA HANDIHALER) 18 MCG inhalation capsule INHALE THE CONTENTS OF 1 CAPSULE VIA HANDIHALER BY MOUTH EVERY DAY 90 capsule 3  . triamcinolone ointment (KENALOG) 0.1 % Apply 1 application topically daily as needed. (Patient taking differently: Apply 1 application topically daily as needed. For rash on finger) 30 g 2  . benzonatate (TESSALON PERLES) 100 MG capsule Take 1 capsule (100 mg total) by mouth 3 (three) times daily as needed for cough. (Patient not taking: Reported on 12/12/2014) 30 capsule 1  . HYDROcodone-acetaminophen (NORCO/VICODIN) 5-325 MG per tablet Take 1 tablet by mouth 2 (two) times daily as needed for moderate pain. (Patient not taking: Reported on 12/12/2014) 60 tablet 0  . predniSONE (DELTASONE) 20 MG tablet Take 1 tablet (20 mg total) by mouth as directed. 2 tablets in the morning for 5 days followed by 1 tablet in the morning for 5 days 15 tablet 0   No current facility-administered medications for this visit.    Functional Status:  In your present state of health, do you have any difficulty performing the following activities: 12/12/2014 11/12/2014  Hearing? N N  Vision? N N  Difficulty  concentrating or making decisions? N N  Walking or climbing stairs? Y N  Dressing or bathing? N N  Doing errands, shopping? N Y  Conservation officer, nature and eating ? N -  Using the Toilet? N -  In the past six months, have you accidently leaked urine? Y -  Do you have problems with loss of bowel control? N -  Managing your Medications? N -  Managing your Finances? N -  Housekeeping or managing your Housekeeping? N -    Fall/Depression Screening: PHQ 2/9 Scores 12/12/2014 12/07/2014 11/12/2014 09/17/2014 08/14/2014 05/04/2014 05/04/2014  PHQ - 2 Score 1 0 0 1 6 0 0  PHQ- 9 Score - - - 4 18 - -   Fall Risk  12/12/2014 12/07/2014 11/12/2014  09/17/2014 08/14/2014  Falls in the past year? _0   Risk for fall due to : - - - - -   Assessment: 79 year old with recent admission 10/31-11/1 with COPD exacerbation. History of pulmonary nodule, depression, heart failure. Member is in the transition of care program.  Medications reviewed-member does not have lisinopril, paroxetine, tessalon pearls. Member expressing concern and stating that at last office visit primary care sent prescriptions in to her mail order pharmacy, but she has only received Celecoxib. RNCM called [facility]. Spoke with [facility] representative, Von, who states the expected arrival date for these medications is November 18th and suggested [facility] call provider and request a prescription for a short term supply be sent to [facility]. She also stated that pharmacy could fill prescription by informing that is emergency supply awaiting arrival of mail ordered medications.   Plan: Call Primary care to inform of medication needs, telephonic call next week.  THN CM Care Plan Problem One        Most Recent Value   Care Plan Problem One  at risk for readmission   Role Documenting the Problem One  Care Management [place] for Problem One  Active   THN Long Term Goal (31-90 days)  member will not be readmitted within 31 days.   THN Long Term Goal Start Date  12/06/14   Interventions for Problem One Long Term Goal  home visit completed, reviewed medications, called primary care to send prescription to [facility] local pharmacy.   THN CM Short Term Goal #1 (0-30 days)  member will attend scheduled visits within the next 30 days.   THN CM Short Term Goal #1 Start Date  12/06/14   Interventions for Short Term Goal #1  encouraged member to contac primary care as needed. discussed importance of attending scheduled appointments.   THN CM Short Term Goal #2 (0-30 days)  member will be able to verbalize inhalers used within the next 1-2 weeks.   THN CM Short  Term Goal #2 Start Date  12/06/14   THN CM Short Term Goal #2 Met Date  12/12/14   Interventions for Short Term Goal #2  presented 3 inhalers and stated how to use.    [employer] CM Care Plan Problem Two        Most Recent Value   Care Plan Problem Two  knoweledge deficti regarding COPD managment   Role Documenting the Problem Two  Care Management Coordinator   Care Plan for Problem Two  Active   Interventions for Problem Two Long Term Goal   Reviewed COPD action plan, encouraged member to review information with her daughter..   THN Long Term Goal (31-90) days  member will verbalize 3 strategies for COPD management.    THN Long Term Goal Start Date  12/12/14   THN CM Short Term Goal #1 (0-30 days)  Member will be able to verbalize COPD zones and action plan for each zone within the next 30 days.   THN CM Short Term Goal #1 Start Date  12/12/14   Interventions for Short Term Goal #2   provided and reviewed zone tool, encouraged member to reviewe material with her daughter     [name], RN, MSN, [place] Coordinator Cell: [phone]

## 2014-12-13 NOTE — Patient Outreach (Signed)
[place] [facility]) Care Management  12/13/2014  Caitlyn Aguirre 01-15-1936 [id number]  Care Coordination: RNCM spoke with [name] at primary care office regarding member's medication needs.  Plan: continue to follow.  [name], RN, MSN, [place] Coordinator Cell: [phone]

## 2014-12-13 NOTE — Telephone Encounter (Signed)
Ok. A 10 day supply of lisinopril and paroxetine are appropriate. I have sent e-scripts to the local pharmacy in our system, which is [facility] on [address]. Please follow up with them about the override process and let the patient know when it will be ready for her. Thank you.

## 2014-12-17 ENCOUNTER — Other Ambulatory Visit: Payer: Self-pay

## 2014-12-17 NOTE — Patient Outreach (Signed)
[place] [facility]) Care Management  12/17/2014  LETTICIA TUZZOLINO 06-06-1935 [id number]  Subjective: " I am doing good, I got my medicine to me so I am doing good".  Assessment: 79 year old with recent admission for COPD exacerbation. Member is in the transition of care program. Denies any issues or problems today. Member reports she is in the [employer].  Plan: Telephonic call next week.  [name], RN, MSN, [place] Coordinator Cell: [phone]

## 2014-12-21 ENCOUNTER — Other Ambulatory Visit: Payer: Self-pay | Admitting: Pharmacist

## 2014-12-21 NOTE — Patient Outreach (Signed)
Caitlyn Aguirre was referred to pharmacy for medication assistance regarding obtaining her medications that she is currently out of. Note that I previously spoke with [name] and her daughter back in June about getting setting up mail order through [employer]. Per referral, patient is receiving her medications through mail order, but has not received refills of her lisinopril or paroxetine in time.   Per EPIC record, since this referral was sent, patient received assistance with obtaining these medications from her local pharmacy from her PCP's office. However, left a HIPAA compliant message on the patient's voicemail. If have not heard from patient by next week, will give her another call at that time to see if patient needs any further assistance with obtaining her medications or has any medication questions.  [name], PharmD Clinical Pharmacist [place] Management [phone]

## 2014-12-24 ENCOUNTER — Other Ambulatory Visit: Payer: Self-pay | Admitting: Pharmacist

## 2014-12-24 ENCOUNTER — Other Ambulatory Visit: Payer: Self-pay

## 2014-12-24 NOTE — Patient Outreach (Signed)
[place] [facility]) Care Management  12/24/2014  Caitlyn Aguirre 1936-02-02 [id number]  Assessment: Call for transition of care call, but member is unable to be reached. [phone] -is  no longer in service. RNCM dialed [phone] HIPPA complaint voice message left.  Plan: Await return call and follow up next week if no return call.  [name], RN, MSN, [place] Coordinator Cell: [phone]

## 2014-12-24 NOTE — Patient Outreach (Signed)
Caitlyn Aguirre was referred to pharmacy for medication assistance regarding obtaining her medications that she was out of. Note that I previously spoke with [name] and her daughter back in June about getting setting up mail order through [employer]. Per referral, patient is receiving her medications through mail order, but has not received refills of her lisinopril or paroxetine in time.   Per EPIC record, since this referral was sent, patient received assistance with obtaining these medications from her local pharmacy from her PCP's office. However, left a HIPAA compliant message on the patient's voicemail. Call attempt #2. If have not heard from patient by next week, will give her another call at that time to see if patient needs any further assistance with obtaining her medications or has any medication questions.  [name], PharmD Clinical Pharmacist [place] Management [phone]

## 2014-12-31 ENCOUNTER — Other Ambulatory Visit: Payer: Self-pay | Admitting: Pharmacist

## 2014-12-31 NOTE — Patient Outreach (Signed)
Caitlyn Aguirre was referred to pharmacy for medication assistance regarding obtaining her medications that she was out of. This issue appears to have been resolved per notes from her PCP's office; however, attempting to reach out to patient to see if she needs any further assistance with obtaining her medications or has any medication questions. Left a HIPAA compliant message on the patient's voicemail. Call attempt #3. I will send patient outreach letter to attempt contact. If I have not heard back from [name] within 10 days, will close pharmacy episode.   [name], PharmD Clinical Pharmacist [place] Management [phone]

## 2015-01-04 ENCOUNTER — Other Ambulatory Visit: Payer: Self-pay

## 2015-01-04 NOTE — Patient Outreach (Signed)
[facility] [facility]) Care Management  01/04/2015  Caitlyn Aguirre [birth year] [id number]  Assessment: Call for transition of care. No answer. HIPPA compliant message left.  Plan: follow up call next week.  [name], RN, MSN, [place] Coordinator Cell: [phone]

## 2015-01-11 ENCOUNTER — Other Ambulatory Visit: Payer: Self-pay

## 2015-01-11 ENCOUNTER — Other Ambulatory Visit: Payer: Self-pay | Admitting: Pharmacist

## 2015-01-11 NOTE — Patient Outreach (Signed)
[facility]) Care Management  01/11/2015  Caitlyn Aguirre 06-21-1935 [id number]  Assessment: Member in the transition of care program-hospitalized 10/31-11/1 for COPD exacerbation. Transition of care call-3rd attempt. No answer. HIPPA compliant message left.  Plan: per policy RNCM will send outreach letter.  [name], RN, MSN, [place] Coordinator Cell: [phone]

## 2015-01-11 NOTE — Patient Outreach (Signed)
Caitlyn Aguirre is a 79 y.o. female referred to pharmacy for medication assistance. Have made three phone call attempts and sent letter. No response within 10 days. Will now close pharmacy episode.  [name], PharmD Clinical Pharmacist [place] Management [phone]

## 2015-01-21 ENCOUNTER — Other Ambulatory Visit: Payer: Self-pay | Admitting: Internal Medicine

## 2015-01-24 ENCOUNTER — Other Ambulatory Visit: Payer: Self-pay

## 2015-01-31 ENCOUNTER — Other Ambulatory Visit: Payer: Self-pay | Admitting: Student in an Organized Health Care Education/Training Program

## 2015-01-31 NOTE — Telephone Encounter (Signed)
Pt requesting all meds to be filled @ [facility] on [address].

## 2015-01-31 NOTE — Telephone Encounter (Signed)
Pt called / instructed she has to call to transfer meds to new pharmacy, [place].

## 2015-02-14 ENCOUNTER — Ambulatory Visit (HOSPITAL_BASED_OUTPATIENT_CLINIC_OR_DEPARTMENT_OTHER): Payer: Medicare Other | Attending: Internal Medicine

## 2015-02-14 DIAGNOSIS — G4736 Sleep related hypoventilation in conditions classified elsewhere: Secondary | ICD-10-CM | POA: Insufficient documentation

## 2015-02-14 DIAGNOSIS — I493 Ventricular premature depolarization: Secondary | ICD-10-CM | POA: Insufficient documentation

## 2015-02-14 DIAGNOSIS — R0683 Snoring: Secondary | ICD-10-CM | POA: Diagnosis not present

## 2015-02-14 DIAGNOSIS — G4733 Obstructive sleep apnea (adult) (pediatric): Secondary | ICD-10-CM | POA: Diagnosis not present

## 2015-02-16 DIAGNOSIS — G4733 Obstructive sleep apnea (adult) (pediatric): Secondary | ICD-10-CM | POA: Diagnosis not present

## 2015-02-16 NOTE — Progress Notes (Signed)
Patient Name: Caitlyn Aguirre, [place] Study Date: 02/14/2015 Gender: Female D.O.B: 25-Mar-1935 Age (years): 79 Referring Provider: Gilles Chiquito Height (inches): 51 Interpreting Physician: Baird Lyons MD, ABSM Weight (lbs): 217 RPSGT: Laren Everts BMI: 33 MRN: [id number] Neck Size: 14.50 CLINICAL INFORMATION Sleep Study Type: Split Night CPAP Indication for sleep study: OSA [place] Sleepiness Score: 7  SLEEP STUDY TECHNIQUE As per the AASM Manual for the Scoring of Sleep and Associated Events v2.3 (April 2016) with a hypopnea requiring 4% desaturations. The channels recorded and monitored were frontal, central and occipital EEG, electrooculogram (EOG), submentalis EMG (chin), nasal and oral airflow, thoracic and abdominal wall motion, anterior tibialis EMG, snore microphone, electrocardiogram, and pulse oximetry. Continuous positive airway pressure (CPAP) was initiated when the patient met split night criteria and was titrated according to treat sleep-disordered breathing.  MEDICATIONS Medications taken by the patient : charted for review     Medication taken during sleep study : Aleve  RESPIRATORY PARAMETERS Diagnostic Total AHI (/hr): 83.3 RDI (/hr): 83.8 OA Index (/hr): 7.5 CA Index (/hr): 0.0 REM AHI (/hr): N/A NREM AHI (/hr): 83.3 Supine AHI (/hr): 98.2 Non-supine AHI (/hr): 76.63 Min O2 Sat (%): 77.00 Mean O2 (%): 85.77 Time below 88% (min): 93.2   Titration Optimal Pressure (cm): 10 AHI at Optimal Pressure (/hr): 0.6 Min O2 at Optimal Pressure (%): 80.0 Supine % at Optimal (%): 41 Sleep % at Optimal (%): 97    SLEEP ARCHITECTURE The recording time for the entire night was 408.7 minutes. During a baseline period of 200.8 minutes, the patient slept for 120.3 minutes in REM and nonREM, yielding a sleep efficiency of 59.9%. Sleep onset after lights out was 33.5 minutes with a REM latency of N/A minutes. The patient spent 22.04% of the night in stage N1 sleep, 77.96% in stage N2  sleep, 0.00% in stage N3 and 0.00% in REM. During the titration period of 197.8 minutes, the patient slept for 175.0 minutes in REM and nonREM, yielding a sleep efficiency of 88.5%. Sleep onset after CPAP initiation was 11.0 minutes with a REM latency of 146.5 minutes. The patient spent 4.29% of the night in stage N1 sleep, 72.86% in stage N2 sleep, 0.00% in stage N3 and 22.86% in REM.  CARDIAC DATA The 2 lead EKG demonstrated sinus rhythm. The mean heart rate was 67.90 beats per minute. Other EKG findings include: PVCs.  LEG MOVEMENT DATA The total Periodic Limb Movements of Sleep (PLMS) were 12. The PLMS index was 2.42 .  IMPRESSIONS - Severe obstructive sleep apnea occurred during the diagnostic portion of the study (AHI = 83.3/hour). An optimal PAP pressure was selected for this patient ( 10 cm of water) - No significant central sleep apnea occurred during the diagnostic portion of the study (CAI = 0.0/hour). - Severe oxygen desaturation was noted during the diagnostic portion of the study (Min O2 = 77.00%). - Supplemental oxygen at 2L was required in addition to CPAP, because saturation on CPAP with room air was about 85% during sleep. - The patient snored with Loud snoring volume during the diagnostic portion of the study. - EKG findings include PVCs. - Clinically significant periodic limb movements did not occur during sleep.  DIAGNOSIS - Obstructive Sleep Apnea (327.23 [G47.33 ICD-10]) - Nocturnal Hypoxemia (327.26 [G47.36 ICD-10])  RECOMMENDATIONS - Trial of CPAP therapy on 10 cm H2O with a Medium size Fisher&Paykel Full Face Mask Simplus mask and heated humidification. - Anticipate patient will also need Home O2 during sleep at 2L, bled though CPAP.  Discuss documentation with DME company/ - Avoid alcohol, sedatives and other CNS depressants that may worsen sleep apnea and disrupt normal sleep architecture. - Sleep hygiene should be reviewed to assess factors that may improve sleep  quality. - Weight management and regular exercise should be initiated or continued.  Deneise Lever Diplomate, American Board of Sleep Medicine  ELECTRONICALLY SIGNED ON:  02/16/2015, 2:08 PM [place] PH: [phone]   FX: [phone] [place]

## 2015-02-27 ENCOUNTER — Ambulatory Visit (HOSPITAL_COMMUNITY)
Admission: RE | Admit: 2015-02-27 | Discharge: 2015-02-27 | Disposition: A | Discharge status: Home / Self Care | Payer: Medicare Other | Source: Ambulatory Visit | Attending: Student in an Organized Health Care Education/Training Program | Admitting: Student in an Organized Health Care Education/Training Program

## 2015-02-27 ENCOUNTER — Encounter: Payer: Self-pay | Admitting: Internal Medicine

## 2015-02-27 ENCOUNTER — Ambulatory Visit (INDEPENDENT_AMBULATORY_CARE_PROVIDER_SITE_OTHER): Payer: Medicare Other | Admitting: Internal Medicine

## 2015-02-27 VITALS — BP 138/68 | HR 74 | Temp 100.4°F | Ht 68.0 in | Wt 210.2 lb

## 2015-02-27 DIAGNOSIS — J438 Other emphysema: Secondary | ICD-10-CM | POA: Diagnosis not present

## 2015-02-27 DIAGNOSIS — I5032 Chronic diastolic (congestive) heart failure: Secondary | ICD-10-CM | POA: Diagnosis not present

## 2015-02-27 DIAGNOSIS — R05 Cough: Secondary | ICD-10-CM | POA: Diagnosis present

## 2015-02-27 DIAGNOSIS — J449 Chronic obstructive pulmonary disease, unspecified: Secondary | ICD-10-CM | POA: Diagnosis not present

## 2015-02-27 DIAGNOSIS — R918 Other nonspecific abnormal finding of lung field: Secondary | ICD-10-CM | POA: Diagnosis not present

## 2015-02-27 LAB — INFLUENZA PANEL BY PCR (TYPE A & B)
H1N1 flu by pcr: NOT DETECTED
Influenza A By PCR: POSITIVE — AB
Influenza B By PCR: NEGATIVE

## 2015-02-27 MED ORDER — OSELTAMIVIR PHOSPHATE 75 MG PO CAPS
75.0000 mg | ORAL_CAPSULE | Freq: Two times a day (BID) | ORAL | Status: DC
Start: 2015-02-27 — End: 2015-04-16

## 2015-02-27 MED ORDER — LEVOFLOXACIN 750 MG PO TABS
750.0000 mg | ORAL_TABLET | Freq: Every day | ORAL | Status: DC
Start: 2015-02-27 — End: 2015-04-16

## 2015-02-27 NOTE — Addendum Note (Signed)
Addended by: Lalla Brothers T on: 02/27/2015 03:37 PM   Modules accepted: Level of Service, SmartSet
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
## 2015-02-27 NOTE — Patient Instructions (Signed)
1. Take Tamiflu 75 mg (1 tab) twice daily for 5 days. 2. Take Levofloxacin 750 mg (1 tab) once daily for 5 days. 3. Call the clinic or go to the emergency room if you have worsening cough or shortness of breath.  Influenza, Adult Influenza ("the flu") is a viral infection of the respiratory tract. It occurs more often in winter months because people spend more time in close contact with one another. Influenza can make you feel very sick. Influenza easily spreads from person to person (contagious). CAUSES  Influenza is caused by a virus that infects the respiratory tract. You can catch the virus by breathing in droplets from an infected person's cough or sneeze. You can also catch the virus by touching something that was recently contaminated with the virus and then touching your mouth, nose, or eyes. RISKS AND COMPLICATIONS You may be at risk for a more severe case of influenza if you smoke cigarettes, have diabetes, have chronic heart disease (such as heart failure) or lung disease (such as asthma), or if you have a weakened immune system. Elderly people and pregnant women are also at risk for more serious infections. The most common problem of influenza is a lung infection (pneumonia). Sometimes, this problem can require emergency medical care and may be life threatening. SIGNS AND SYMPTOMS  Symptoms typically last 4 to 10 days and may include:  Fever.  Chills.  Headache, body aches, and muscle aches.  Sore throat.  Chest discomfort and cough.  Poor appetite.  Weakness or feeling tired.  Dizziness.  Nausea or vomiting. DIAGNOSIS  Diagnosis of influenza is often made based on your history and a physical exam. A nose or throat swab test can be done to confirm the diagnosis. TREATMENT  In mild cases, influenza goes away on its own. Treatment is directed at relieving symptoms. For more severe cases, your health care provider may prescribe antiviral medicines to shorten the sickness.  Antibiotic medicines are not effective because the infection is caused by a virus, not by bacteria. HOME CARE INSTRUCTIONS  Take medicines only as directed by your health care provider.  Use a cool mist humidifier to make breathing easier.  Get plenty of rest until your temperature returns to normal. This usually takes 3 to 4 days.  Drink enough fluid to keep your urine clear or pale yellow.  Cover yourmouth and nosewhen coughing or sneezing,and wash your handswellto prevent thevirusfrom spreading.  Stay homefromwork orschool untilthe fever is gonefor at least 9full day. PREVENTION  An annual influenza vaccination (flu shot) is the best way to avoid getting influenza. An annual flu shot is now routinely recommended for all adults in the [place] IF:  You experiencechest pain, yourcough worsens,or you producemore mucus.  Youhave nausea,vomiting, ordiarrhea.  Your fever returns or gets worse. SEEK IMMEDIATE MEDICAL CARE IF:  You havetrouble breathing, you become short of breath,or your skin ornails becomebluish.  You have severe painor stiffnessin the neck.  You develop a sudden headache, or pain in the face or ear.  You have nausea or vomiting that you cannot control. MAKE SURE YOU:   Understand these instructions.  Will watch your condition.  Will get help right away if you are not doing well or get worse.   This information is not intended to replace advice given to you by your health care provider. Make sure you discuss any questions you have with your health care provider.   Document Released: 01/17/2000 Document Revised: 02/09/2014 Document  Reviewed: 04/20/2011 Elsevier Interactive Patient Education [employer].

## 2015-02-27 NOTE — Progress Notes (Signed)
Internal Medicine Clinic Attending  I saw and evaluated the patient.  I personally confirmed the key portions of the history and exam documented by Dr. Lovena Le and I reviewed pertinent patient test results.  The assessment, diagnosis, and plan were formulated together and I agree with the documentation in the resident's note.  [name] looks ill today. She is febrile, short of breath, productive cough with purulent sputum. Grandson with viral illness one week ago, family tells [place] it was the flu, but doesn't look like this was confirmed with PCR. I personally reviewed her chest Xray today which showed largely clear lungs, same prominent right sided vessel, no clear infiltrate or consolidation. Plan for BMP and CBC today, send Influenza PCR. Given her fragility and exposure, will start empiric treatment with tamiflu for influenza and levofloxacin for pneumonia. I gave her return precautions, if she gets worse she will need admission.

## 2015-02-27 NOTE — Assessment & Plan Note (Signed)
Patient presents complaining of 1 week worsening general malaise, cough productive of yellow sputum, runny nose, congestion, sore throat, headache, and right sided muscle pains.  Her grandson, with whom she lives, had a viral illness for which he was seen in the ED last week.  He was not tested for flu and given symptomatic treatment.  The patient did not have a flu shot.   She has Gold Stage 1 COPD and grade 1 diastolic dysfunction.  She denies fevers, but has a measured temp of 38C in clinic today, satting 96% on RA.   Physical exam reveals bilateral crackles but no wheezes.  No tachypnea or respiratory distress.  A/P: Influenza vs PNA > COPD exacerbation or HFpEF exacerbation.  Her fever and probably sick contact raise concern for acute flu in an unvaccinated person. Given her underlying lung disease and overall malaise and general unwellbeing, will test for flu and get CXR.  With the goal of keeping patient out of the hospital, will treat with Tamiflu and antibiotics. - Tamiflu 75 mg BID x5 days - Levaquin 750 mg daily x5days. - CXR - Flu PCR - CBC/diff

## 2015-02-28 ENCOUNTER — Telehealth: Payer: Self-pay | Admitting: Student in an Organized Health Care Education/Training Program

## 2015-02-28 LAB — CBC WITH DIFFERENTIAL/PLATELET
Basophils Absolute: 0 10*3/uL (ref 0.0–0.2)
Basos: 0 %
EOS (ABSOLUTE): 0.2 10*3/uL (ref 0.0–0.4)
Eos: 3 %
Hematocrit: 37.2 % (ref 34.0–46.6)
Hemoglobin: 12.8 g/dL (ref 11.1–15.9)
Immature Grans (Abs): 0 10*3/uL (ref 0.0–0.1)
Immature Granulocytes: 0 %
Lymphocytes Absolute: 1.1 10*3/uL (ref 0.7–3.1)
Lymphs: 21 %
MCH: 29.5 pg (ref 26.6–33.0)
MCHC: 34.4 g/dL (ref 31.5–35.7)
MCV: 86 fL (ref 79–97)
Monocytes Absolute: 0.9 10*3/uL (ref 0.1–0.9)
Monocytes: 18 %
Neutrophils Absolute: 3 10*3/uL (ref 1.4–7.0)
Neutrophils: 58 %
Platelets: 182 10*3/uL (ref 150–379)
RBC: 4.34 x10E6/uL (ref 3.77–5.28)
RDW: 14.2 % (ref 12.3–15.4)
WBC: 5.2 10*3/uL (ref 3.4–10.8)

## 2015-02-28 NOTE — Telephone Encounter (Signed)
I called Caitlyn Aguirre this morning to check in on her. We diagnosed her with Influenza A yesterday, she is high risk for admission because of underlying COPD and fragility. She did obtain both tamiflu and levofloxacin, took first doses yesterday. She is feeling a little better today, still productive cough and general malaise.  I think it is fine to complete a five day course of both these medicines. Given the thick purulent sputum she is producing, she is high risk for bacterial infection in addition to the influenza. I again told her to return to clinic or ED if symptoms worsen.

## 2015-03-06 DIAGNOSIS — J449 Chronic obstructive pulmonary disease, unspecified: Secondary | ICD-10-CM | POA: Diagnosis not present

## 2015-03-29 ENCOUNTER — Telehealth: Payer: Self-pay | Admitting: Student in an Organized Health Care Education/Training Program

## 2015-03-29 DIAGNOSIS — G4733 Obstructive sleep apnea (adult) (pediatric): Secondary | ICD-10-CM

## 2015-03-29 NOTE — Telephone Encounter (Signed)
Patient called stating she had a Sleep Study back in January and has not rec'd her CPAP machine with her supplies.  Can a CPAP and Supply Order be placed as the Results are in Epic?  Also called [facility] and spoke with the Supply department .  The patient already has oxygen @ home and would need the CPAP machine so that the O2 can be attached to her machine.  Please advise.

## 2015-04-03 DIAGNOSIS — J449 Chronic obstructive pulmonary disease, unspecified: Secondary | ICD-10-CM | POA: Diagnosis not present

## 2015-04-03 NOTE — Telephone Encounter (Signed)
Thanks Chilon. I have placed the DME order for CPAP based on the sleep study interpretations.   Damita Dunnings

## 2015-04-15 ENCOUNTER — Telehealth: Payer: Self-pay | Admitting: Student in an Organized Health Care Education/Training Program

## 2015-04-15 NOTE — Telephone Encounter (Signed)
APPT. REMINDER CALL, LMTCB °

## 2015-04-16 ENCOUNTER — Encounter: Payer: Self-pay | Admitting: Student in an Organized Health Care Education/Training Program

## 2015-04-16 ENCOUNTER — Ambulatory Visit (INDEPENDENT_AMBULATORY_CARE_PROVIDER_SITE_OTHER): Payer: Medicare Other | Admitting: Student in an Organized Health Care Education/Training Program

## 2015-04-16 VITALS — BP 150/80 | HR 70 | Temp 98.2°F | Ht 68.0 in | Wt 211.2 lb

## 2015-04-16 DIAGNOSIS — Z7982 Long term (current) use of aspirin: Secondary | ICD-10-CM

## 2015-04-16 DIAGNOSIS — Z79899 Other long term (current) drug therapy: Secondary | ICD-10-CM

## 2015-04-16 DIAGNOSIS — Z7984 Long term (current) use of oral hypoglycemic drugs: Secondary | ICD-10-CM

## 2015-04-16 DIAGNOSIS — K219 Gastro-esophageal reflux disease without esophagitis: Secondary | ICD-10-CM

## 2015-04-16 DIAGNOSIS — I2510 Atherosclerotic heart disease of native coronary artery without angina pectoris: Secondary | ICD-10-CM | POA: Diagnosis not present

## 2015-04-16 DIAGNOSIS — G4733 Obstructive sleep apnea (adult) (pediatric): Secondary | ICD-10-CM

## 2015-04-16 DIAGNOSIS — I10 Essential (primary) hypertension: Secondary | ICD-10-CM

## 2015-04-16 DIAGNOSIS — E1165 Type 2 diabetes mellitus with hyperglycemia: Secondary | ICD-10-CM

## 2015-04-16 DIAGNOSIS — I252 Old myocardial infarction: Secondary | ICD-10-CM

## 2015-04-16 DIAGNOSIS — E119 Type 2 diabetes mellitus without complications: Secondary | ICD-10-CM

## 2015-04-16 LAB — GLUCOSE, CAPILLARY: Glucose-Capillary: 195 mg/dL — ABNORMAL HIGH (ref 65–99)

## 2015-04-16 LAB — POCT GLYCOSYLATED HEMOGLOBIN (HGB A1C): Hemoglobin A1C: 8.4

## 2015-04-16 MED ORDER — ESOMEPRAZOLE MAGNESIUM 40 MG PO CPDR: 40.0000 mg | DELAYED_RELEASE_CAPSULE | Freq: Every day | ORAL | Status: DC

## 2015-04-16 NOTE — Assessment & Plan Note (Signed)
Blood pressure is elevated above goal today. The patient did not take any of her morning blood pressure medicines today because she thought she was not supposed to prior to a doctor's visit. Difficult to make any adjustments to her medications in this context. Plan for now is to continue lisinopril, amlodipine, carvedilol for her current regimen. Asked her to check blood pressure at home and call me if it remains elevated. We will recheck in 3 months. Strongly encouraged her to take all of her medications on the day of her appointments.

## 2015-04-16 NOTE — Progress Notes (Signed)
   See Encounters tab for problem-based medical decision making  __________________________________________________________  HPI:   80 year old woman presents today for follow-up of diabetes. I saw her in clinic about one month ago for acute influenza. She said it took about 3 weeks to resolve. She was treated with Tamiflu and levofloxacin. Says that she now feels back to normal. Good exertional capacity. Reports good compliance with her medications except for today, she did not think she was supposed to take her medications on the day of her doctor's visits so has not taken any a.m. tablets today. No side effects at home, no episodes of hypoglycemia. Requesting a new eye doctor for annual exams. No episodes of chest pain or exertional angina. She has been active recently, just took up fishing with her family.  Reports that she is having some increased reflux symptoms. Says that her current PPI is not as effective as one of the medicine she was taking many years ago. Otherwise eating and drinking well. No nausea or vomiting.   __________________________________________________________  Problem List: Patient Active Problem List   Diagnosis Date Noted  . CAD (coronary artery disease) 09/17/2014  . Grief 08/15/2014  . Eczema of hand 06/21/2013  . Preventive measure 02/17/2013  . Obstructive sleep apnea 12/25/2011  . Hypothyroidism 01/21/2006  . Depression 01/21/2006  . Essential hypertension 01/21/2006  . Chronic diastolic heart failure ([place]) 01/21/2006  . COPD (chronic obstructive pulmonary disease) ([place]) 01/21/2006  . Esophageal reflux 01/21/2006  . Osteoarthritis 01/21/2006  . Diabetes mellitus ([place]) 10/04/1998    Medications: Reconciled today in Epic __________________________________________________________  Physical Exam:  Vital Signs: Filed Vitals:   04/16/15 0940 04/16/15 1002  BP: 171/76 150/80  Pulse: 70 70  Temp: 98.2 F (36.8 C)   TempSrc: Oral   Height: 5\' 8"   (1.727 m)   Weight: 211 lb 3.2 oz (95.8 kg)   SpO2: 100%     Gen: Well appearing, NAD Neck: No cervical LAD, No thyromegaly or nodules, No JVD. CV: RRR, no murmurs Pulm: Normal effort, CTA throughout, no wheezing Abd: Soft, NT, ND, normal BS.  Ext: Warm, no edema, normal joints Skin: No atypical appearing moles. No rashes

## 2015-04-16 NOTE — Assessment & Plan Note (Signed)
Acute MI in 1999 resulting in PCI. She had some atypical type chest pain late last year. Imaging stress test September 2016 was low risk. She has good follow-up with her cardiologist. Doing well with good exertional capacity currently. Plan is to continue aspirin, atorvastatin, carvedilol, and lisinopril.

## 2015-04-16 NOTE — Assessment & Plan Note (Signed)
Hemoglobin A1c a little up today at 8.4%. Historically she has been well controlled between 7 and 8%. Our goal given her advanced age is around 8%. Currently she is only on metformin 1000 mg twice a day. I think this medication is fine to continue for now. We talked about avoiding sugar enriched beverages, and she set a goal to lower her A1c by diet intervention only. Recheck A1c in 3 months, if still elevated above 8% we can consider initiating a SGLT 2 inhibitor.

## 2015-04-16 NOTE — Assessment & Plan Note (Signed)
Sleep study with CPAP titration completed January 2017. I ordered CPAP machine with the settings from that split-night study in February. Patient has not yet had delivery of the machine from the home health agency. I asked our support staff to track down this order and ensure that the home health company contact the patient to initiate nocturnal positive pressure ventilation.

## 2015-04-16 NOTE — Assessment & Plan Note (Signed)
Patient with a history of reflux since at least 2012 that was not adequately treated with H2 blocker. She was using omeprazole but reports it is not as beneficial as the other purple pill she has used in the past. I change her prescription over to esomeprazole for a therapeutic trial. Long term celecoxib for arthritis may also be contributing to sensation of dyspepsia, but the patient wants to continue celecoxib because she thinks it is helpful for her knee pain.

## 2015-04-22 ENCOUNTER — Encounter: Payer: Medicare Other | Admitting: Obstetrics & Gynecology

## 2015-04-22 ENCOUNTER — Encounter: Payer: Self-pay | Admitting: Obstetrics & Gynecology

## 2015-04-24 ENCOUNTER — Other Ambulatory Visit: Payer: Self-pay | Admitting: Internal Medicine

## 2015-05-04 DIAGNOSIS — J449 Chronic obstructive pulmonary disease, unspecified: Secondary | ICD-10-CM | POA: Diagnosis not present

## 2015-05-08 ENCOUNTER — Telehealth: Payer: Self-pay | Admitting: Student in an Organized Health Care Education/Training Program

## 2015-05-08 NOTE — Telephone Encounter (Signed)
Called pt, she will see dr rice tomorrow at 1045

## 2015-05-08 NOTE — Telephone Encounter (Signed)
There are no easy solutions here. With her obstructive sleep apnea she is high risk for pain medications. She will need an appointment with a resident to examine where her arthritis is, potentially check xrays, talk about physical therapy, exercise, maybe bracing, maybe steroid injections.

## 2015-05-08 NOTE — Telephone Encounter (Signed)
Pt calls and states she needs something to help her arthritis pain, she states she hurts night and day, she takes the celebrex but cannot tell it helps, she states for the last 2 weeks it has been non stop, pleaser advise

## 2015-05-08 NOTE — Telephone Encounter (Signed)
NEED NEW MED FOR PAIN

## 2015-05-09 ENCOUNTER — Encounter: Payer: Self-pay | Admitting: Licensed Clinical Social Worker

## 2015-05-09 ENCOUNTER — Ambulatory Visit (INDEPENDENT_AMBULATORY_CARE_PROVIDER_SITE_OTHER): Payer: Medicare Other | Admitting: Internal Medicine

## 2015-05-09 ENCOUNTER — Telehealth: Payer: Self-pay | Admitting: Student in an Organized Health Care Education/Training Program

## 2015-05-09 ENCOUNTER — Encounter: Payer: Self-pay | Admitting: Internal Medicine

## 2015-05-09 VITALS — BP 144/79 | HR 81 | Temp 98.2°F | Resp 16 | Ht 68.0 in | Wt 210.8 lb

## 2015-05-09 DIAGNOSIS — M150 Primary generalized (osteo)arthritis: Secondary | ICD-10-CM

## 2015-05-09 DIAGNOSIS — N393 Stress incontinence (female) (male): Secondary | ICD-10-CM | POA: Diagnosis not present

## 2015-05-09 DIAGNOSIS — E119 Type 2 diabetes mellitus without complications: Secondary | ICD-10-CM | POA: Diagnosis not present

## 2015-05-09 DIAGNOSIS — G4700 Insomnia, unspecified: Secondary | ICD-10-CM | POA: Insufficient documentation

## 2015-05-09 DIAGNOSIS — Z79899 Other long term (current) drug therapy: Secondary | ICD-10-CM | POA: Diagnosis not present

## 2015-05-09 DIAGNOSIS — M8949 Other hypertrophic osteoarthropathy, multiple sites: Secondary | ICD-10-CM

## 2015-05-09 DIAGNOSIS — D649 Anemia, unspecified: Secondary | ICD-10-CM | POA: Diagnosis not present

## 2015-05-09 DIAGNOSIS — M159 Polyosteoarthritis, unspecified: Secondary | ICD-10-CM

## 2015-05-09 LAB — GLUCOSE, CAPILLARY: Glucose-Capillary: 247 mg/dL — ABNORMAL HIGH (ref 65–99)

## 2015-05-09 MED ORDER — CYCLOBENZAPRINE HCL 5 MG PO TABS: 5.0000 mg | ORAL_TABLET | Freq: Every evening | ORAL | Status: DC | PRN

## 2015-05-09 MED ORDER — CELECOXIB 100 MG PO CAPS: 100.0000 mg | ORAL_CAPSULE | Freq: Two times a day (BID) | ORAL | Status: DC | PRN

## 2015-05-09 NOTE — Assessment & Plan Note (Signed)
Pt having difficulty managing medications. Pharmacy and social work met with patient today for home health needs and she was also given a pillbox.
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
## 2015-05-09 NOTE — Assessment & Plan Note (Signed)
Pt requesting order for briefs for stress incontinence. Pt previously has paid out of pocket for these.  - rx for pull up briefs placed.

## 2015-05-09 NOTE — Patient Instructions (Signed)
You take flexeril 5mg  at night as needed for pain. This medication will make you sleepy. Be careful not to drive or operate heavy machinery when taking this medication.   You can continue to take celebrex 100mg  twice a day as needed, make sure to take Nexium everyday.

## 2015-05-09 NOTE — Progress Notes (Signed)
Caitlyn Aguirre received new [facility] Medicare card and information regarding change to PCP on card.  Dr. Ellwood Dense pt's previous PCP is no longer in-network and thus insurance changed to another provider.  CSW met with Caitlyn Aguirre and assisted pt with updating her [employer] Medicare PCP to  Dr. Lalla Brothers.  Confirmation# [id number]

## 2015-05-09 NOTE — Telephone Encounter (Signed)
DME ORDER for Incontinence Supplies faxed to [facility] @ [phone].  Please have patient to call [facility] @ [phone] if any questions and press the prompt for supplies.

## 2015-05-09 NOTE — Assessment & Plan Note (Signed)
Placed a referral for nutrition and diabetes education. F/u in clinic in 2 months for a1c lab.

## 2015-05-09 NOTE — Assessment & Plan Note (Signed)
Pt has chronic pain from OA w/ a pain contract for vicodin. She was recently given celebrex 100mg  qd instead of vicodin as per chart review she said that vicodin was not helping w/ pain. Her pain starts at her left ankle/ left shin area and radiates up to her thigh and on occasion her left shoulder. Pain is 8/10 and prevents her from sleeping. She denies falls or weakness. On exam she has full ROM of left ankle and knee, good flexion and extension of pain, non tender to palpation of spine. Pain is relieved w/ muscle relaxants she has tried from her daughter. Pain could be due to her OA or RLS as she describes pain has coming from within and preventing her from sleeping. Denies skin pallor, blood in her stool, CP. Last ferritin nl in 2009. Denies pica sx.   - rx for flexeril 5mg  qhs prn for pain - continue celebrex, increased to 100mg  BID - checking ferritin.

## 2015-05-10 LAB — FERRITIN: Ferritin: 44 ng/mL (ref 15–150)

## 2015-05-13 NOTE — Progress Notes (Signed)
Case discussed with Dr. Hulen Luster soon after the resident saw the patient.  We reviewed the resident's history and exam and pertinent patient test results.  I agree with the assessment, diagnosis, and plan of care documented in the resident's note.

## 2015-06-03 DIAGNOSIS — J449 Chronic obstructive pulmonary disease, unspecified: Secondary | ICD-10-CM | POA: Diagnosis not present

## 2015-06-10 ENCOUNTER — Encounter (HOSPITAL_COMMUNITY): Payer: Self-pay | Admitting: *Deleted

## 2015-06-10 ENCOUNTER — Emergency Department (HOSPITAL_COMMUNITY): Payer: Medicare Other

## 2015-06-10 ENCOUNTER — Emergency Department (HOSPITAL_COMMUNITY)
Admission: EM | Admit: 2015-06-10 | Discharge: 2015-06-11 | Disposition: A | Discharge status: Home / Self Care | Payer: Medicare Other | Attending: Emergency Medicine | Admitting: Emergency Medicine

## 2015-06-10 DIAGNOSIS — J449 Chronic obstructive pulmonary disease, unspecified: Secondary | ICD-10-CM | POA: Diagnosis not present

## 2015-06-10 DIAGNOSIS — Z87891 Personal history of nicotine dependence: Secondary | ICD-10-CM | POA: Insufficient documentation

## 2015-06-10 DIAGNOSIS — Y9389 Activity, other specified: Secondary | ICD-10-CM | POA: Diagnosis not present

## 2015-06-10 DIAGNOSIS — E039 Hypothyroidism, unspecified: Secondary | ICD-10-CM | POA: Diagnosis not present

## 2015-06-10 DIAGNOSIS — Z7984 Long term (current) use of oral hypoglycemic drugs: Secondary | ICD-10-CM | POA: Insufficient documentation

## 2015-06-10 DIAGNOSIS — Z87828 Personal history of other (healed) physical injury and trauma: Secondary | ICD-10-CM | POA: Diagnosis not present

## 2015-06-10 DIAGNOSIS — Y9289 Other specified places as the place of occurrence of the external cause: Secondary | ICD-10-CM | POA: Insufficient documentation

## 2015-06-10 DIAGNOSIS — Z7982 Long term (current) use of aspirin: Secondary | ICD-10-CM | POA: Insufficient documentation

## 2015-06-10 DIAGNOSIS — I509 Heart failure, unspecified: Secondary | ICD-10-CM | POA: Insufficient documentation

## 2015-06-10 DIAGNOSIS — S59901A Unspecified injury of right elbow, initial encounter: Secondary | ICD-10-CM | POA: Diagnosis not present

## 2015-06-10 DIAGNOSIS — F329 Major depressive disorder, single episode, unspecified: Secondary | ICD-10-CM | POA: Diagnosis not present

## 2015-06-10 DIAGNOSIS — Z8673 Personal history of transient ischemic attack (TIA), and cerebral infarction without residual deficits: Secondary | ICD-10-CM | POA: Insufficient documentation

## 2015-06-10 DIAGNOSIS — I252 Old myocardial infarction: Secondary | ICD-10-CM | POA: Diagnosis not present

## 2015-06-10 DIAGNOSIS — M2550 Pain in unspecified joint: Secondary | ICD-10-CM

## 2015-06-10 DIAGNOSIS — T148 Other injury of unspecified body region: Secondary | ICD-10-CM | POA: Diagnosis not present

## 2015-06-10 DIAGNOSIS — K219 Gastro-esophageal reflux disease without esophagitis: Secondary | ICD-10-CM | POA: Insufficient documentation

## 2015-06-10 DIAGNOSIS — S4991XA Unspecified injury of right shoulder and upper arm, initial encounter: Secondary | ICD-10-CM | POA: Insufficient documentation

## 2015-06-10 DIAGNOSIS — E119 Type 2 diabetes mellitus without complications: Secondary | ICD-10-CM | POA: Diagnosis not present

## 2015-06-10 DIAGNOSIS — M1990 Unspecified osteoarthritis, unspecified site: Secondary | ICD-10-CM | POA: Diagnosis not present

## 2015-06-10 DIAGNOSIS — Z8701 Personal history of pneumonia (recurrent): Secondary | ICD-10-CM | POA: Insufficient documentation

## 2015-06-10 DIAGNOSIS — S8992XA Unspecified injury of left lower leg, initial encounter: Secondary | ICD-10-CM | POA: Diagnosis present

## 2015-06-10 DIAGNOSIS — W07XXXA Fall from chair, initial encounter: Secondary | ICD-10-CM | POA: Diagnosis not present

## 2015-06-10 DIAGNOSIS — Y998 Other external cause status: Secondary | ICD-10-CM | POA: Diagnosis not present

## 2015-06-10 DIAGNOSIS — M25561 Pain in right knee: Secondary | ICD-10-CM | POA: Diagnosis not present

## 2015-06-10 DIAGNOSIS — Z79899 Other long term (current) drug therapy: Secondary | ICD-10-CM | POA: Diagnosis not present

## 2015-06-10 DIAGNOSIS — M25551 Pain in right hip: Secondary | ICD-10-CM | POA: Diagnosis not present

## 2015-06-10 DIAGNOSIS — M25521 Pain in right elbow: Secondary | ICD-10-CM | POA: Diagnosis not present

## 2015-06-10 DIAGNOSIS — W19XXXA Unspecified fall, initial encounter: Secondary | ICD-10-CM

## 2015-06-10 DIAGNOSIS — M25562 Pain in left knee: Secondary | ICD-10-CM | POA: Diagnosis not present

## 2015-06-10 DIAGNOSIS — G8929 Other chronic pain: Secondary | ICD-10-CM | POA: Insufficient documentation
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
## 2015-06-10 NOTE — ED Notes (Signed)
Pt was sitting in a chair at store, the chair broke and pt fell backwards. Is having pain to bilateral knees, right hip and right elbow.

## 2015-06-11 ENCOUNTER — Ambulatory Visit: Payer: Medicare Other | Admitting: Dietician

## 2015-06-11 ENCOUNTER — Telehealth: Payer: Self-pay | Admitting: Dietician

## 2015-06-11 MED ORDER — TRAMADOL HCL 50 MG PO TABS
50.0000 mg | ORAL_TABLET | Freq: Once | ORAL | Status: AC
  Administered 2015-06-11: 50 mg via ORAL
  Filled 2015-06-11: qty 1

## 2015-06-11 MED ORDER — TRAMADOL HCL 50 MG PO TABS: 50.0000 mg | ORAL_TABLET | Freq: Four times a day (QID) | ORAL | Status: DC | PRN

## 2015-06-11 MED ORDER — ONDANSETRON 4 MG PO TBDP
4.0000 mg | ORAL_TABLET | Freq: Once | ORAL | Status: AC
  Administered 2015-06-11: 4 mg via ORAL
  Filled 2015-06-11: qty 1

## 2015-06-11 NOTE — Discharge Instructions (Signed)
Joint Pain Joint pain, which is also called arthralgia, can be caused by many things. Joint pain often goes away when you follow your health care provider's instructions for relieving pain at home. However, joint pain can also be caused by conditions that require further treatment. Common causes of joint pain include:  Bruising in the area of the joint.  Overuse of the joint.  Wear and tear on the joints that occur with aging (osteoarthritis).  Various other forms of arthritis.  A buildup of a crystal form of uric acid in the joint (gout).  Infections of the joint (septic arthritis) or of the bone (osteomyelitis). Your health care provider may recommend medicine to help with the pain. If your joint pain continues, additional tests may be needed to diagnose your condition. HOME CARE INSTRUCTIONS Watch your condition for any changes. Follow these instructions as directed to lessen the pain that you are feeling.  Take medicines only as directed by your health care provider.  Rest the affected area for as long as your health care provider says that you should. If directed to do so, raise the painful joint above the level of your heart while you are sitting or lying down.  Do not do things that cause or worsen pain.  If directed, apply ice to the painful area:  Put ice in a plastic bag.  Place a towel between your skin and the bag.  Leave the ice on for 20 minutes, 2-3 times per day.  Wear an elastic bandage, splint, or sling as directed by your health care provider. Loosen the elastic bandage or splint if your fingers or toes become numb and tingle, or if they turn cold and blue.  Begin exercising or stretching the affected area as directed by your health care provider. Ask your health care provider what types of exercise are safe for you.  Keep all follow-up visits as directed by your health care provider. This is important. SEEK MEDICAL CARE IF:  Your pain increases, and medicine  does not help.  Your joint pain does not improve within 3 days.  You have increased bruising or swelling.  You have a fever.  You lose 10 lb (4.5 kg) or more without trying. SEEK IMMEDIATE MEDICAL CARE IF:  You are not able to move the joint.  Your fingers or toes become numb or they turn cold and blue.   This information is not intended to replace advice given to you by your health care provider. Make sure you discuss any questions you have with your health care provider.   Document Released: 01/19/2005 Document Revised: 02/09/2014 Document Reviewed: 10/31/2013 Elsevier Interactive Patient Education 2016 [place].  Cryotherapy Cryotherapy means treatment with cold. Ice or gel packs can be used to reduce both pain and swelling. Ice is the most helpful within the first 24 to 48 hours after an injury or flare-up from overusing a muscle or joint. Sprains, strains, spasms, burning pain, shooting pain, and aches can all be eased with ice. Ice can also be used when recovering from surgery. Ice is effective, has very few side effects, and is safe for most people to use. PRECAUTIONS  Ice is not a safe treatment option for people with:  Raynaud phenomenon. This is a condition affecting small blood vessels in the extremities. Exposure to cold may cause your problems to return.  Cold hypersensitivity. There are many forms of cold hypersensitivity, including:  Cold urticaria. Red, itchy hives appear on the skin when the tissues  begin to warm after being iced.  Cold erythema. This is a red, itchy rash caused by exposure to cold.  Cold hemoglobinuria. Red blood cells break down when the tissues begin to warm after being iced. The hemoglobin that carry oxygen are passed into the urine because they cannot combine with blood proteins fast enough.  Numbness or altered sensitivity in the area being iced. If you have any of the following conditions, do not use ice until you have discussed  cryotherapy with your caregiver:  Heart conditions, such as arrhythmia, angina, or chronic heart disease.  High blood pressure.  Healing wounds or open skin in the area being iced.  Current infections.  Rheumatoid arthritis.  Poor circulation.  Diabetes. Ice slows the blood flow in the region it is applied. This is beneficial when trying to stop inflamed tissues from spreading irritating chemicals to surrounding tissues. However, if you expose your skin to cold temperatures for too long or without the proper protection, you can damage your skin or nerves. Watch for signs of skin damage due to cold. HOME CARE INSTRUCTIONS Follow these tips to use ice and cold packs safely.  Place a dry or damp towel between the ice and skin. A damp towel will cool the skin more quickly, so you may need to shorten the time that the ice is used.  For a more rapid response, add gentle compression to the ice.  Ice for no more than 10 to 20 minutes at a time. The bonier the area you are icing, the less time it will take to get the benefits of ice.  Check your skin after 5 minutes to make sure there are no signs of a poor response to cold or skin damage.  Rest 20 minutes or more between uses.  Once your skin is numb, you can end your treatment. You can test numbness by very lightly touching your skin. The touch should be so light that you do not see the skin dimple from the pressure of your fingertip. When using ice, most people will feel these normal sensations in this order: cold, burning, aching, and numbness.  Do not use ice on someone who cannot communicate their responses to pain, such as small children or people with dementia. HOW TO MAKE AN ICE PACK Ice packs are the most common way to use ice therapy. Other methods include ice massage, ice baths, and cryosprays. Muscle creams that cause a cold, tingly feeling do not offer the same benefits that ice offers and should not be used as a substitute  unless recommended by your caregiver. To make an ice pack, do one of the following:  Place crushed ice or a bag of frozen vegetables in a sealable plastic bag. Squeeze out the excess air. Place this bag inside another plastic bag. Slide the bag into a pillowcase or place a damp towel between your skin and the bag.  Mix 3 parts water with 1 part rubbing alcohol. Freeze the mixture in a sealable plastic bag. When you remove the mixture from the freezer, it will be slushy. Squeeze out the excess air. Place this bag inside another plastic bag. Slide the bag into a pillowcase or place a damp towel between your skin and the bag. SEEK MEDICAL CARE IF:  You develop white spots on your skin. This may give the skin a blotchy (mottled) appearance.  Your skin turns blue or pale.  Your skin becomes waxy or hard.  Your swelling gets worse. MAKE SURE YOU:  Understand these instructions.  Will watch your condition.  Will get help right away if you are not doing well or get worse.   This information is not intended to replace advice given to you by your health care provider. Make sure you discuss any questions you have with your health care provider.   Document Released: 09/15/2010 Document Revised: 02/09/2014 Document Reviewed: 09/15/2010 Elsevier Interactive Patient Education [employer].

## 2015-06-11 NOTE — ED Notes (Signed)
Pt verbalized understanding of discharge instructions and follow up care

## 2015-06-13 NOTE — Telephone Encounter (Signed)
Called patient to see if she wanted to reschedule her missed appointment. She is willing to be seen on same day as her doctor appointment. Will try to arrange this.

## 2015-06-14 ENCOUNTER — Ambulatory Visit: Payer: Medicare Other | Admitting: Dietician

## 2015-06-14 ENCOUNTER — Encounter: Payer: Self-pay | Admitting: Internal Medicine

## 2015-06-14 ENCOUNTER — Ambulatory Visit (INDEPENDENT_AMBULATORY_CARE_PROVIDER_SITE_OTHER): Payer: Self-pay | Admitting: Internal Medicine

## 2015-06-14 VITALS — BP 132/67 | HR 77 | Temp 97.9°F | Ht 68.0 in | Wt 213.2 lb

## 2015-06-14 DIAGNOSIS — M150 Primary generalized (osteo)arthritis: Principal | ICD-10-CM

## 2015-06-14 DIAGNOSIS — Z7984 Long term (current) use of oral hypoglycemic drugs: Secondary | ICD-10-CM

## 2015-06-14 DIAGNOSIS — M1712 Unilateral primary osteoarthritis, left knee: Secondary | ICD-10-CM

## 2015-06-14 DIAGNOSIS — M8949 Other hypertrophic osteoarthropathy, multiple sites: Secondary | ICD-10-CM

## 2015-06-14 DIAGNOSIS — K219 Gastro-esophageal reflux disease without esophagitis: Secondary | ICD-10-CM

## 2015-06-14 DIAGNOSIS — E119 Type 2 diabetes mellitus without complications: Secondary | ICD-10-CM

## 2015-06-14 DIAGNOSIS — Z9181 History of falling: Secondary | ICD-10-CM

## 2015-06-14 DIAGNOSIS — M159 Polyosteoarthritis, unspecified: Secondary | ICD-10-CM

## 2015-06-14 DIAGNOSIS — E1165 Type 2 diabetes mellitus with hyperglycemia: Secondary | ICD-10-CM

## 2015-06-14 LAB — GLUCOSE, CAPILLARY: Glucose-Capillary: 230 mg/dL — ABNORMAL HIGH (ref 65–99)

## 2015-06-14 MED ORDER — DICLOFENAC SODIUM 1 % TD GEL: 4.0000 g | Freq: Four times a day (QID) | TRANSDERMAL | Status: DC

## 2015-06-14 NOTE — Patient Instructions (Signed)
Caitlyn Aguirre,  It was great to see you today.  For your left knee pain, we are prescribing an anti-inflammatory gel. Please apply it four times a day.  Your diabetes numbers are still high. Please cut out all sugary drinks. That includes lemonade. Here is a list of other drinks you can have:  1. Water  2. Unsweetened tea  3. Unsweetened coffee  4. Sugar-free fruit juice  5. Low-fat milk 6. Crystal Light Mixes

## 2015-06-14 NOTE — Progress Notes (Signed)
   Subjective:    Patient ID: Caitlyn Aguirre, female    DOB: Nov 28, 1935, 80 y.o.   MRN: [id number]  HPI  Caitlyn Aguirre 80 year old woman with a PMH of T2DM, OA of the knees, HTN who comes to the clinic today to discuss left knee pain that has worsened after a fall, T2DM, and ongoing management of her grief. She reports being at a department store last week. She sough a chair since she was feeling tired. Shortly after sitting in the chair, one of the legs broke, she fell, and she hit her left knee. Since then, the OA pain she has normally felt in that leg has intensified and is no longer responsive to Celebrex alone. She is able to ambulate on it, albeit with some discomfort. She denies any joint swelling.   For her T2DM, she takes metformin 1000 mg BID. She eats a relatively healthy diet and had cut out sugary sodas. However, she drinks sweet lemonade on a daily basis, at least 2-3 glasses.   Review of Systems  Constitutional: Negative for fever and fatigue.  Eyes: Negative for visual disturbance.  Respiratory: Negative for cough and shortness of breath.   Cardiovascular: Negative for chest pain and palpitations.  Endocrine: Negative for polydipsia, polyphagia and polyuria.  Neurological: Negative for dizziness, light-headedness and headaches.       Objective:   Physical Exam  Constitutional: She appears well-developed. No distress.  Cardiovascular: Normal rate, regular rhythm, normal heart sounds and intact distal pulses.   Pulmonary/Chest: Effort normal and breath sounds normal. No respiratory distress. She has no wheezes.  Musculoskeletal:  Normal range of motion of left ankle and knee. No swelling in left. No appreciable TTP.  Neurological: She is alert. She has normal reflexes.  Psychiatric: She has a normal mood and affect. Her behavior is normal.  Vitals reviewed.         Assessment & Plan:   Please see problem based assessment and plan for details.

## 2015-06-16 DIAGNOSIS — K219 Gastro-esophageal reflux disease without esophagitis: Secondary | ICD-10-CM | POA: Insufficient documentation

## 2015-06-16 NOTE — Assessment & Plan Note (Signed)
A: Recent fall due to broken chair led to exacerbation of her OA symptoms. We will add on topical NSAIDs to her current regimen  P: Voltaren gel to left knee Celebrex 100 mg BID

## 2015-06-16 NOTE — Assessment & Plan Note (Signed)
A: Despite A1c being over goal of 8.0 at 8.4, I am hesitant to start a sulfonylurea or insulin in this 80 year old woman and introduce the risk of hypoglycemia. I believe there are simple dietary changes we can make. I have given her a list of low/no-sugar beverages she can drink instead of lemonade.   P: Dietary changes Continue Metformin 1g BID

## 2015-06-16 NOTE — Assessment & Plan Note (Signed)
S: Tolerating omeprazole daily. Says it is effective.  A: Omeprazole efffective  P: Refill omeprazole

## 2015-06-17 NOTE — Progress Notes (Signed)
Internal Medicine Clinic Attending  Case discussed with Dr. Ford at the time of the visit.  We reviewed the resident's history and exam and pertinent patient test results.  I agree with the assessment, diagnosis, and plan of care documented in the resident's note.  

## 2015-06-20 ENCOUNTER — Telehealth: Payer: Self-pay | Admitting: *Deleted

## 2015-06-20 NOTE — Addendum Note (Signed)
Addended by: Hulan Fray on: 06/20/2015 05:50 PM   Modules accepted: Orders

## 2015-06-20 NOTE — Telephone Encounter (Signed)
Received call from family-insurance is requiring a prior authorization for the voltaren gel 1%.  Request submitted online via Cover My Meds-request approved. Both family and patient is aware.[name]5/18/20174:15 PM

## 2015-07-04 ENCOUNTER — Ambulatory Visit (INDEPENDENT_AMBULATORY_CARE_PROVIDER_SITE_OTHER): Payer: Medicare Other | Admitting: Internal Medicine

## 2015-07-04 ENCOUNTER — Encounter: Payer: Self-pay | Admitting: Internal Medicine

## 2015-07-04 VITALS — BP 150/75 | HR 113 | Temp 98.2°F | Ht 68.0 in | Wt 212.5 lb

## 2015-07-04 DIAGNOSIS — M150 Primary generalized (osteo)arthritis: Secondary | ICD-10-CM

## 2015-07-04 DIAGNOSIS — M545 Low back pain: Secondary | ICD-10-CM

## 2015-07-04 DIAGNOSIS — G8929 Other chronic pain: Secondary | ICD-10-CM | POA: Diagnosis not present

## 2015-07-04 DIAGNOSIS — E119 Type 2 diabetes mellitus without complications: Secondary | ICD-10-CM

## 2015-07-04 DIAGNOSIS — M25512 Pain in left shoulder: Secondary | ICD-10-CM | POA: Diagnosis not present

## 2015-07-04 DIAGNOSIS — J449 Chronic obstructive pulmonary disease, unspecified: Secondary | ICD-10-CM | POA: Diagnosis not present

## 2015-07-04 DIAGNOSIS — M159 Polyosteoarthritis, unspecified: Secondary | ICD-10-CM

## 2015-07-04 DIAGNOSIS — E1165 Type 2 diabetes mellitus with hyperglycemia: Secondary | ICD-10-CM

## 2015-07-04 DIAGNOSIS — M8949 Other hypertrophic osteoarthropathy, multiple sites: Secondary | ICD-10-CM

## 2015-07-04 LAB — GLUCOSE, CAPILLARY: Glucose-Capillary: 382 mg/dL — ABNORMAL HIGH (ref 65–99)

## 2015-07-04 NOTE — Patient Instructions (Addendum)
1. Try to avoid movements that hurt your shoulder.  Try the exercise below and use Voltaren gel on the shoulder.   2. Please take all medications as prescribed.    3. If you have worsening of your symptoms or new symptoms arise, please call the clinic [phone]), or go to the ER immediately if symptoms are severe.  Please return in 2 weeks to see how your shoulder is doing and for follow-up with Dr. Evette Doffing.  Shoulder Range of Motion Exercises Shoulder range of motion (ROM) exercises are designed to keep the shoulder moving freely. They are often recommended for people who have shoulder pain. MOVEMENT EXERCISE When you are able, do this exercise 5-6 days per week, or as told by your health care provider. Work toward doing 2 sets of 10 swings. Pendulum Exercise How To Do This Exercise Lying Down 1. Lie face-down on a bed with your abdomen close to the side of the bed. 2. Let your arm hang over the side of the bed. 3. Relax your shoulder, arm, and hand. 4. Slowly and gently swing your arm forward and back. Do not use your neck muscles to swing your arm. They should be relaxed. If you are struggling to swing your arm, have someone gently swing it for you. When you do this exercise for the first time, swing your arm at a 15 degree angle for 15 seconds, or swing your arm 10 times. As pain lessens over time, increase the angle of the swing to 30-45 degrees. 5. Repeat steps 1-4 with the other arm. How To Do This Exercise While Standing 1. Stand next to a sturdy chair or table and hold on to it with your hand.  Bend forward at the waist.  Bend your knees slightly.  Relax your other arm and let it hang limp.  Relax the shoulder blade of the arm that is hanging and let it drop.  While keeping your shoulder relaxed, use body motion to swing your arm in small circles. The first time you do this exercise, swing your arm for about 30 seconds or 10 times. When you do it next time, swing your arm for  a little longer.  Stand up tall and relax.  Repeat steps 1-7, this time changing the direction of the circles. 2. Repeat steps 1-8 with the other arm. STRETCHING EXERCISES Do these exercises 3-4 times per day on 5-6 days per week or as told by your health care provider. Work toward holding the stretch for 20 seconds. Stretching Exercise 1 1. Lift your arm straight out in front of you. 2. Bend your arm 90 degrees at the elbow (right angle) so your forearm goes across your body and looks like the letter "L." 3. Use your other arm to gently pull the elbow forward and across your body. 4. Repeat steps 1-3 with the other arm. Stretching Exercise 2 You will need a towel or rope for this exercise. 1. Bend one arm behind your back with the palm facing outward. 2. Hold a towel with your other hand. 3. Reach the arm that holds the towel above your head, and bend that arm at the elbow. Your wrist should be behind your neck. 4. Use your free hand to grab the free end of the towel. 5. With the higher hand, gently pull the towel up behind you. 6. With the lower hand, pull the towel down behind you. 7. Repeat steps 1-6 with the other arm. STRENGTHENING EXERCISES Do each of these exercises  at four different times of day (sessions) every day or as told by your health care provider. To begin with, repeat each exercise 5 times (repetitions). Work toward doing 3 sets of 12 repetitions or as told by your health care provider. Strengthening Exercise 1 You will need a light weight for this activity. As you grow stronger, you may use a heavier weight. 1. Standing with a weight in your hand, lift your arm straight out to the side until it is at the same height as your shoulder. 2. Bend your arm at 90 degrees so that your fingers are pointing to the ceiling. 3. Slowly raise your hand until your arm is straight up in the air. 4. Repeat steps 1-3 with the other arm. Strengthening Exercise 2 You will need a light  weight for this activity. As you grow stronger, you may use a heavier weight. 1. Standing with a weight in your hand, gradually move your straight arm in an arc, starting at your side, then out in front of you, then straight up over your head. 2. Gradually move your other arm in an arc, starting at your side, then out in front of you, then straight up over your head. 3. Repeat steps 1-2 with the other arm. Strengthening Exercise 3 You will need an elastic band for this activity. As you grow stronger, gradually increase the size of the bands or increase the number of bands that you use at one time. 1. While standing, hold an elastic band in one hand and raise that arm up in the air. 2. With your other hand, pull down the band until that hand is by your side. 3. Repeat steps 1-2 with the other arm.   This information is not intended to replace advice given to you by your health care provider. Make sure you discuss any questions you have with your health care provider.   Document Released: 10/18/2002 Document Revised: 06/05/2014 Document Reviewed: 01/15/2014 Elsevier Interactive Patient Education [employer].
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
## 2015-07-06 DIAGNOSIS — M25519 Pain in unspecified shoulder: Secondary | ICD-10-CM | POA: Insufficient documentation

## 2015-07-06 NOTE — Assessment & Plan Note (Addendum)
Assessment:  I suspect rotator cuff tendinopathy vs shoulder OA.  She has reduced abduction B/L.  Strength intact and neg drop arm and empty can making tear unlikely.  ROM does not suggest frozen shoulder and there is no swelling to suggest [place] shoulder.   Plan:  Conservative management - she already has prescriptions for Voltaren gel and Celecoxib.  She was provided with handout on shoulder ROM exercises.  She was advised to return for follow-up in 2 weeks to check on progress.  Can consider steroid injection, PT in the future if warranted.

## 2015-07-06 NOTE — Assessment & Plan Note (Signed)
Assessment:  CBG 382 this visit.  We did not have time to address DM at this acute visit but I will ask her to follow-up with her PCP soon for further management of DM.

## 2015-07-06 NOTE — Assessment & Plan Note (Signed)
Assessment:  She reports acute on chronic right lumbar pain radiating into right buttock but no numbness/tingling down leg.  Started a few days ago.  She had a fall 1 month ago and has since been evaluated in clinic, but she denies falling since then.  She says she has felt this pain before.  Not disturbing her sleep.  No systemic symptoms.  There is no rash to suggest shingles.  Likely MSK related to lumbar and or hip OA.   Plan:  Continue Voltaren gel and Celebrex prn.  Activity as tolerated.  She will return in 2 weeks for follow-up.

## 2015-07-08 NOTE — Progress Notes (Signed)
Internal Medicine Clinic Attending  Case discussed with Dr. Wilson soon after the resident saw the patient.  We reviewed the resident's history and exam and pertinent patient test results.  I agree with the assessment, diagnosis, and plan of care documented in the resident's note.  

## 2015-07-18 ENCOUNTER — Encounter: Payer: Self-pay | Admitting: Internal Medicine

## 2015-07-18 ENCOUNTER — Telehealth: Payer: Self-pay | Admitting: Student in an Organized Health Care Education/Training Program

## 2015-07-18 ENCOUNTER — Ambulatory Visit (INDEPENDENT_AMBULATORY_CARE_PROVIDER_SITE_OTHER): Payer: Medicare Other | Admitting: Student in an Organized Health Care Education/Training Program

## 2015-07-18 VITALS — BP 155/74 | HR 69 | Temp 98.2°F | Ht 68.0 in | Wt 216.4 lb

## 2015-07-18 DIAGNOSIS — N393 Stress incontinence (female) (male): Secondary | ICD-10-CM

## 2015-07-18 DIAGNOSIS — M25462 Effusion, left knee: Secondary | ICD-10-CM | POA: Diagnosis not present

## 2015-07-18 DIAGNOSIS — Z9181 History of falling: Secondary | ICD-10-CM | POA: Diagnosis not present

## 2015-07-18 DIAGNOSIS — M79662 Pain in left lower leg: Secondary | ICD-10-CM | POA: Diagnosis not present

## 2015-07-18 DIAGNOSIS — G4700 Insomnia, unspecified: Secondary | ICD-10-CM

## 2015-07-18 MED ORDER — MELATONIN 10 MG PO TABS: 1.0000 | ORAL_TABLET | Freq: Every day | ORAL | Status: DC

## 2015-07-18 NOTE — Assessment & Plan Note (Signed)
Difficulty initiating sleep, goes to be late, relatively poor sleep hygiene. She has been using flexeril for this which I think is too high risk. I advised against centrally acting meds given advanced age and high risk for falls. Plan to start Melatonin 10mg  nightly.

## 2015-07-18 NOTE — Assessment & Plan Note (Signed)
Continues to be a problem. I also think that anticholinergics are too high risk given high fall risk and advanced age. Plan is to prescribe Depends and try timed voiding and fluid restriction in the evening.

## 2015-07-18 NOTE — Assessment & Plan Note (Signed)
Patient with fall from sitting 6 weeks ago while shopping. Evaluated in ED after, imaging of left leg without fracture. Pain after has improved, but has mild persistent soreness of the left knee and shin. There is minor soft tissue swelling, crepitus of the knee and a small effusion. Likely still inflammed and contused tissue, but functioning well, walking well. I advised continuing voltaren gel to the knee for anti-inflammation. She did a course of celecoxib with limited benefit, so will stop NSAIDs for now. I will order a rollator walker to give her more support while walking.

## 2015-07-18 NOTE — Progress Notes (Signed)
   See Encounters tab for problem-based medical decision making  __________________________________________________________  HPI:  80 year old woman here for follow up of right leg pain. She fell while shopping in early May, said that her chair broke and she fell to the ground. Hit her left leg. Went to ED after, had xrays that were normal. Pain has been improving slowly. She can still walk on the left leg. Now says it is still sore. Shoulder pain has almost resolved. No other falls at home. Requesting muscle relaxer for arthritis pain, then says it really helps her sleep at night. Goes to bed late, takes naps. No fevers or chills. Eating and drinking well. Lives at home by herself.   __________________________________________________________  Problem List: Patient Active Problem List   Diagnosis Date Noted  . High Risk for Falls 07/18/2015  . GERD (gastroesophageal reflux disease) 06/16/2015  . Urinary, incontinence, stress female 05/09/2015  . Insomnia 05/09/2015  . CAD (coronary artery disease) 09/17/2014  . Eczema of hand 06/21/2013  . Preventive measure 02/17/2013  . Obstructive sleep apnea 12/25/2011  . Hypothyroidism 01/21/2006  . Depression 01/21/2006  . Essential hypertension 01/21/2006  . Chronic diastolic heart failure ([employer]) 01/21/2006  . COPD (chronic obstructive pulmonary disease) ([place]) 01/21/2006  . Osteoarthritis 01/21/2006  . Diabetes mellitus ([place]) 10/04/1998    Medications: Reconciled today in Epic __________________________________________________________  Physical Exam:  Vital Signs: Filed Vitals:   07/18/15 1347 07/18/15 1348  BP:  155/74  Pulse:  69  Temp: 98.2 F (36.8 C)   TempSrc: Oral   Height: 5\' 8"  (1.727 m)   Weight: 216 lb 6.4 oz (98.158 kg)   SpO2:  94%    Gen: Well appearing, NAD ENT: OP clear without erythema or exudate.  Neck: No cervical LAD, No thyromegaly or nodules, No JVD. Abd: Soft, NT, ND, normal BS.  Ext: Warm, no edema,  right knee with moderate crepitus and small effusion. No pain with passive ROM. There is a small soft tissue knot on the anterior shin. No ecchymosis.  Skin: No atypical appearing moles. No rashes

## 2015-07-18 NOTE — Telephone Encounter (Signed)
FYI- I tried back in April to fill her Adult diaper order and per her insurance [facility] and [employer] she does not qualify.  [facility] has called her as well as I have notifed her.  I will send the Rollator Walker over to [facility] today.

## 2015-07-18 NOTE — Patient Instructions (Signed)
1. Stop taking flexeril or tramadol. They will increase your risk of falling.  2. Keep using voltaren gel to the leg for them pain. I expect it to be sore for a few more days.   3. Start taking Melatonin 10mg  in the evening around 7pm for your sleep.

## 2015-07-18 NOTE — Telephone Encounter (Signed)
Ok. Thank you for trying.

## 2015-07-25 ENCOUNTER — Emergency Department (HOSPITAL_COMMUNITY): Payer: Medicare Other

## 2015-07-25 ENCOUNTER — Encounter (HOSPITAL_COMMUNITY): Payer: Self-pay | Admitting: Emergency Medicine

## 2015-07-25 ENCOUNTER — Emergency Department (HOSPITAL_COMMUNITY)
Admission: EM | Admit: 2015-07-25 | Discharge: 2015-07-25 | Disposition: A | Discharge status: Home / Self Care | Payer: Medicare Other | Attending: Emergency Medicine | Admitting: Emergency Medicine

## 2015-07-25 DIAGNOSIS — I110 Hypertensive heart disease with heart failure: Secondary | ICD-10-CM | POA: Diagnosis not present

## 2015-07-25 DIAGNOSIS — Z955 Presence of coronary angioplasty implant and graft: Secondary | ICD-10-CM | POA: Diagnosis not present

## 2015-07-25 DIAGNOSIS — Z7984 Long term (current) use of oral hypoglycemic drugs: Secondary | ICD-10-CM | POA: Diagnosis not present

## 2015-07-25 DIAGNOSIS — Z87891 Personal history of nicotine dependence: Secondary | ICD-10-CM | POA: Insufficient documentation

## 2015-07-25 DIAGNOSIS — M25551 Pain in right hip: Secondary | ICD-10-CM | POA: Diagnosis not present

## 2015-07-25 DIAGNOSIS — I509 Heart failure, unspecified: Secondary | ICD-10-CM | POA: Diagnosis not present

## 2015-07-25 DIAGNOSIS — S79911A Unspecified injury of right hip, initial encounter: Secondary | ICD-10-CM | POA: Diagnosis not present

## 2015-07-25 DIAGNOSIS — Z8673 Personal history of transient ischemic attack (TIA), and cerebral infarction without residual deficits: Secondary | ICD-10-CM | POA: Insufficient documentation

## 2015-07-25 DIAGNOSIS — R10A0 Flank pain, unspecified side: Secondary | ICD-10-CM

## 2015-07-25 DIAGNOSIS — J449 Chronic obstructive pulmonary disease, unspecified: Secondary | ICD-10-CM | POA: Diagnosis not present

## 2015-07-25 DIAGNOSIS — I252 Old myocardial infarction: Secondary | ICD-10-CM | POA: Diagnosis not present

## 2015-07-25 DIAGNOSIS — R109 Unspecified abdominal pain: Secondary | ICD-10-CM | POA: Diagnosis present

## 2015-07-25 DIAGNOSIS — E119 Type 2 diabetes mellitus without complications: Secondary | ICD-10-CM | POA: Diagnosis not present

## 2015-07-25 DIAGNOSIS — Z7982 Long term (current) use of aspirin: Secondary | ICD-10-CM | POA: Diagnosis not present

## 2015-07-25 DIAGNOSIS — Z79899 Other long term (current) drug therapy: Secondary | ICD-10-CM | POA: Diagnosis not present

## 2015-07-25 LAB — CBC WITH DIFFERENTIAL/PLATELET
Basophils Absolute: 0 10*3/uL (ref 0.0–0.1)
Basophils Relative: 0 %
Eosinophils Absolute: 0.3 10*3/uL (ref 0.0–0.7)
Eosinophils Relative: 5 %
HCT: 35.3 % — ABNORMAL LOW (ref 36.0–46.0)
Hemoglobin: 11.8 g/dL — ABNORMAL LOW (ref 12.0–15.0)
Lymphocytes Relative: 28 %
Lymphs Abs: 1.8 10*3/uL (ref 0.7–4.0)
MCH: 28.9 pg (ref 26.0–34.0)
MCHC: 33.4 g/dL (ref 30.0–36.0)
MCV: 86.5 fL (ref 78.0–100.0)
Monocytes Absolute: 0.4 10*3/uL (ref 0.1–1.0)
Monocytes Relative: 6 %
Neutro Abs: 3.8 10*3/uL (ref 1.7–7.7)
Neutrophils Relative %: 61 %
Platelets: 180 10*3/uL (ref 150–400)
RBC: 4.08 MIL/uL (ref 3.87–5.11)
RDW: 14.5 % (ref 11.5–15.5)
WBC: 6.3 10*3/uL (ref 4.0–10.5)

## 2015-07-25 LAB — URINALYSIS, ROUTINE W REFLEX MICROSCOPIC
Bilirubin Urine: NEGATIVE
Glucose, UA: NEGATIVE mg/dL
Hgb urine dipstick: NEGATIVE
Ketones, ur: NEGATIVE mg/dL
Leukocytes, UA: NEGATIVE
Nitrite: NEGATIVE
Protein, ur: NEGATIVE mg/dL
Specific Gravity, Urine: 1.019 (ref 1.005–1.030)
pH: 5.5 (ref 5.0–8.0)

## 2015-07-25 LAB — COMPREHENSIVE METABOLIC PANEL
ALT: 10 U/L — ABNORMAL LOW (ref 14–54)
AST: 18 U/L (ref 15–41)
Albumin: 3.6 g/dL (ref 3.5–5.0)
Alkaline Phosphatase: 102 U/L (ref 38–126)
Anion gap: 9 (ref 5–15)
BUN: 10 mg/dL (ref 6–20)
CO2: 22 mmol/L (ref 22–32)
Calcium: 9.6 mg/dL (ref 8.9–10.3)
Chloride: 109 mmol/L (ref 101–111)
Creatinine, Ser: 0.84 mg/dL (ref 0.44–1.00)
GFR calc Af Amer: >60 mL/min (ref >60)
GFR calc non Af Amer: >60 mL/min (ref >60)
Glucose, Bld: 177 mg/dL — ABNORMAL HIGH (ref 65–99)
Potassium: 3.6 mmol/L (ref 3.5–5.1)
Sodium: 140 mmol/L (ref 135–145)
Total Bilirubin: 0.7 mg/dL (ref 0.3–1.2)
Total Protein: 6.5 g/dL (ref 6.5–8.1)

## 2015-07-25 LAB — LIPASE, BLOOD: Lipase: 25 U/L (ref 11–51)

## 2015-07-25 LAB — BRAIN NATRIURETIC PEPTIDE: B Natriuretic Peptide: 10.5 pg/mL (ref 0.0–100.0)

## 2015-07-25 MED ORDER — HYDROCODONE-ACETAMINOPHEN 5-325 MG PO TABS
1.0000 | ORAL_TABLET | Freq: Four times a day (QID) | ORAL | Status: DC | PRN
Start: 2015-07-25 — End: 2015-07-31

## 2015-07-25 MED ORDER — HYDROCODONE-ACETAMINOPHEN 5-325 MG PO TABS
1.0000 | ORAL_TABLET | Freq: Once | ORAL | Status: AC
  Administered 2015-07-25: 1 via ORAL
  Filled 2015-07-25: qty 1

## 2015-07-25 MED ORDER — ACETAMINOPHEN 325 MG PO TABS
650.0000 mg | ORAL_TABLET | Freq: Once | ORAL | Status: AC
  Administered 2015-07-25: 650 mg via ORAL
  Filled 2015-07-25: qty 2
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
## 2015-07-25 NOTE — ED Notes (Signed)
Pt in EMS from home, reports R sided flank pain X1 week (pt did fall 1 week ago) Tender with palpation, pain with ROM. Denies hx kidney stones, denies hx UTI

## 2015-07-25 NOTE — ED Notes (Signed)
Dr.Lockwood at bedside  

## 2015-07-25 NOTE — Discharge Instructions (Signed)
As discussed, your evaluation today has been largely reassuring.  But, it is important that you monitor your condition carefully, and do not hesitate to return to the ED if you develop new, or concerning changes in your condition.  Otherwise, please follow-up with your physician for appropriate ongoing care.  Flank Pain Flank pain refers to pain that is located on the side of the body between the upper abdomen and the back. The pain may occur over a short period of time (acute) or may be long-term or reoccurring (chronic). It may be mild or severe. Flank pain can be caused by many things. CAUSES  Some of the more common causes of flank pain include:  Muscle strains.   Muscle spasms.   A disease of your spine (vertebral disk disease).   A lung infection (pneumonia).   Fluid around your lungs (pulmonary edema).   A kidney infection.   Kidney stones.   A very painful skin rash caused by the chickenpox virus (shingles).   Gallbladder disease.  [place] care will depend on the cause of your pain. In general,  Rest as directed by your caregiver.  Drink enough fluids to keep your urine clear or pale yellow.  Only take over-the-counter or prescription medicines as directed by your caregiver. Some medicines may help relieve the pain.  Tell your caregiver about any changes in your pain.  Follow up with your caregiver as directed. SEEK IMMEDIATE MEDICAL CARE IF:   Your pain is not controlled with medicine.   You have new or worsening symptoms.  Your pain increases.   You have abdominal pain.   You have shortness of breath.   You have persistent nausea or vomiting.   You have swelling in your abdomen.   You feel faint or pass out.   You have blood in your urine.  You have a fever or persistent symptoms for more than 2-3 days.  You have a fever and your symptoms suddenly get worse. MAKE SURE YOU:   Understand these  instructions.  Will watch your condition.  Will get help right away if you are not doing well or get worse.   This information is not intended to replace advice given to you by your health care provider. Make sure you discuss any questions you have with your health care provider.   Document Released: 03/12/2005 Document Revised: 10/14/2011 Document Reviewed: 09/03/2011 Elsevier Interactive Patient Education [employer].

## 2015-07-25 NOTE — ED Notes (Signed)
EDP at bedside  

## 2015-07-29 ENCOUNTER — Encounter (HOSPITAL_COMMUNITY): Payer: Self-pay

## 2015-07-29 ENCOUNTER — Ambulatory Visit (HOSPITAL_COMMUNITY)
Admission: EM | Admit: 2015-07-29 | Discharge: 2015-07-29 | Disposition: A | Discharge status: Home / Self Care | Payer: Medicare Other | Attending: Family Medicine | Admitting: Family Medicine

## 2015-07-29 DIAGNOSIS — M4698 Unspecified inflammatory spondylopathy, sacral and sacrococcygeal region: Secondary | ICD-10-CM

## 2015-07-29 DIAGNOSIS — M47818 Spondylosis without myelopathy or radiculopathy, sacral and sacrococcygeal region: Secondary | ICD-10-CM

## 2015-07-29 MED ORDER — TRIAMCINOLONE ACETONIDE 40 MG/ML IJ SUSP
INTRAMUSCULAR | Status: AC
  Filled 2015-07-29: qty 1

## 2015-07-29 MED ORDER — BUPIVACAINE HCL (PF) 0.5 % IJ SOLN
INTRAMUSCULAR | Status: AC
  Filled 2015-07-29: qty 20

## 2015-07-29 MED ORDER — LIDOCAINE HCL (PF) 1 % IJ SOLN
INTRAMUSCULAR | Status: AC
  Filled 2015-07-29: qty 5

## 2015-07-29 MED ORDER — BUPIVACAINE HCL (PF) 0.25 % IJ SOLN
10.0000 mL | Freq: Once | INTRAMUSCULAR | Status: AC
  Administered 2015-07-29: 10 mL

## 2015-07-29 MED ORDER — LIDOCAINE HCL (PF) 1 % IJ SOLN
2.0000 mL | Freq: Once | INTRAMUSCULAR | Status: AC
  Administered 2015-07-29: 2 mL

## 2015-07-29 MED ORDER — TRIAMCINOLONE ACETONIDE 40 MG/ML IJ SUSP
40.0000 mg | Freq: Once | INTRAMUSCULAR | Status: AC
  Administered 2015-07-29: 40 mg via INTRA_ARTICULAR

## 2015-07-29 NOTE — Discharge Instructions (Signed)
Bursitis Bursitis is when the fluid-filled sac (bursa) that covers and protects a joint is swollen (inflamed). Bursitis is most common near joints, especially the knees, elbows, hips, and shoulders.  HOME CARE  Take medicines only as told by your doctor.  If you were prescribed an antibiotic medicine, finish it all even if you start to feel better.  Rest the affected area as told by your doctor.  Keep the area raised up.  Avoid doing things that make the pain worse.  Apply ice to the injured area:  Place ice in a plastic bag.  Place a towel between your skin and the bag.  Leave the ice on for 20 minutes, 2-3 times a day.  Use splints, braces, pads, or walking aids as told by your doctor.  Keep all follow-up visits as told by your doctor. This is important. GET HELP IF:   You have more pain with home care.  You have a fever.  You have chills.   This information is not intended to replace advice given to you by your health care provider. Make sure you discuss any questions you have with your health care provider.   Document Released: 07/09/2009 Document Revised: 02/09/2014 Document Reviewed: 04/10/2013 Elsevier Interactive Patient Education [employer].

## 2015-07-29 NOTE — ED Notes (Signed)
Patient presents with lower back and side pain x1 mo, pt went to ED and all test was normal, but pt states pain has worsen and she has been taking hydrocodone but it is not helping with pain No acute distress
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
## 2015-07-29 NOTE — ED Notes (Signed)
Patient discharged by Frank Patrick, PA 

## 2015-07-31 ENCOUNTER — Encounter: Payer: Self-pay | Admitting: Internal Medicine

## 2015-07-31 ENCOUNTER — Ambulatory Visit (INDEPENDENT_AMBULATORY_CARE_PROVIDER_SITE_OTHER): Payer: Medicare Other | Admitting: Internal Medicine

## 2015-07-31 VITALS — BP 152/75 | HR 80 | Temp 98.5°F | Wt 212.4 lb

## 2015-07-31 DIAGNOSIS — R109 Unspecified abdominal pain: Secondary | ICD-10-CM | POA: Diagnosis not present

## 2015-07-31 MED ORDER — ATORVASTATIN CALCIUM 40 MG PO TABS: ORAL_TABLET | ORAL | Status: DC

## 2015-07-31 MED ORDER — IBUPROFEN 600 MG PO TABS: 600.0000 mg | ORAL_TABLET | Freq: Three times a day (TID) | ORAL | Status: DC | PRN

## 2015-07-31 NOTE — Patient Instructions (Signed)
General Instructions: - We will get a kidney ultrasound today. Possible that you have a kidney stone. - Can take ibuprofen 600 mg three times daily for pain control  Thank you for bringing your medicines today. This helps [place] keep you safe from mistakes.   Progress Toward Treatment Goals:  Treatment Goal 05/04/2014  Hemoglobin A1C unable to assess  Blood pressure deteriorated    Self Care Goals & Plans:  Self Care Goal 07/31/2015  Manage my medications take my medicines as prescribed; refill my medications on time; bring my medications to every visit  Monitor my health bring my glucose meter and log to each visit; keep track of my blood pressure; keep track of my blood glucose  Eat healthy foods eat more vegetables; eat foods that are low in salt; eat baked foods instead of fried foods  Be physically active find an activity I enjoy  Prevent falls have my vision checked; wear appropriate shoes  Meeting treatment goals -    Home Blood Glucose Monitoring 05/09/2015  Check my blood sugar no home glucose monitoring  When to check my blood sugar N/A     Care Management & Community Referrals:  Referral 05/09/2015  Referrals made for care management support pharmacy clinic

## 2015-07-31 NOTE — Progress Notes (Signed)
   Subjective:    Patient ID: Caitlyn Aguirre, female    DOB: 12/03/1935, 80 y.o.   MRN: [id number]  HPI Caitlyn Aguirre is a 80yo woman with PMHx of HTN, CAD, CHF, COPD, and type 2 DM who presents today for follow up of her right flank pain.  She reports her pain started about 2 weeks ago. She describes the pain as directly over her right flank and worsening over the last 2 weeks. Pain is currently 10/10, "feels like a knife cutting", intermittent, and does not radiate down her leg or to her left or middle back. She denies any fevers, chills, rashes, dysuria, hematuria, frequency, abdominal pain, nausea, or vomiting. Denies any trauma to the area or falls. She sought medical attention in the ED on 6/22 where basic lab work, UA, and x-ray of her pelvis were negative for any abnormalities. The ED had provided her with a prescription for Norco 5-325 mg Q6H PRN #15 tablets that she states "did not touch the pain" despite completing the entire prescription. She then went to an urgent care clinic on 6/26 where they provided a steroid injection in her right hip. She states this helped her pain that evening, but then the pain was present again that morning. She notes placing a hot towel over her right flank helps the pain. She notes walking or laying on her right side exacerbates the pain. No position makes it comfortable. She has tried voltaren gel as well but this provided no relief.    Review of Systems General: Denies night sweats, changes in weight, changes in appetite HEENT: Denies headaches, ear pain, changes in vision, rhinorrhea, sore throat CV: Denies CP, palpitations, SOB, orthopnea Pulm: Denies SOB, cough, wheezing GI: Denies diarrhea, constipation, melena, hematochezia GU: See HPI Msk: See HPI Neuro: Denies weakness, numbness, tingling Skin: Denies bruising Psych: Denies depression, anxiety, hallucinations    Objective:   Physical Exam General: elderly woman sitting up, NAD HEENT: [place]/AT, EOMI,  sclera anicteric, mucus membranes moist CV: RRR, no m/g/r Pulm: CTA bilaterally, breaths non-labored Abd: BS+, soft, obese, non-tender Back: No tenderness to palpation of spinous processes. She has significant tenderness to palpation over the right flank.  Ext: warm, no edema. She has difficulty extending her right leg due to pain in her back. Neuro: alert and oriented x 3. Strength 5/5 in upper and lower extremities bilaterally. Skin: No rashes or bruising appreciated     Assessment & Plan:  Please refer to A&P documentation.

## 2015-08-01 ENCOUNTER — Ambulatory Visit (HOSPITAL_COMMUNITY)
Admission: RE | Admit: 2015-08-01 | Discharge: 2015-08-01 | Disposition: A | Discharge status: Home / Self Care | Payer: Medicare Other | Source: Ambulatory Visit | Attending: Internal Medicine | Admitting: Internal Medicine

## 2015-08-01 DIAGNOSIS — R109 Unspecified abdominal pain: Secondary | ICD-10-CM | POA: Insufficient documentation

## 2015-08-01 NOTE — Progress Notes (Signed)
Medicine attending: Medical history, presenting problems, physical findings, and medications, reviewed with resident physician Dr Carly Rivet on the day of the patient visit and I concur with her evaluation and management plan. 

## 2015-08-01 NOTE — Assessment & Plan Note (Signed)
Her focal right flank pain is concerning for a possible kidney stone vs a musculoskeletal etiology (pulled muscle). Given her UA was normal 1 week ago and not experiencing any urinary symptoms, I doubt she has a UTI. Will obtain a renal US to get a better look at the kidneys and see if a kidney stone may be present. Advised her to take ibuprofen 600 mg TID as needed for pain. Recommended for her to return to clinic if she develops a fever or has worsening pain.

## 2015-08-03 DIAGNOSIS — J449 Chronic obstructive pulmonary disease, unspecified: Secondary | ICD-10-CM | POA: Diagnosis not present

## 2015-08-14 ENCOUNTER — Other Ambulatory Visit: Payer: Self-pay | Admitting: Internal Medicine

## 2015-08-21 ENCOUNTER — Ambulatory Visit (INDEPENDENT_AMBULATORY_CARE_PROVIDER_SITE_OTHER): Payer: Medicare Other | Admitting: Internal Medicine

## 2015-08-21 ENCOUNTER — Encounter: Payer: Self-pay | Admitting: Internal Medicine

## 2015-08-21 DIAGNOSIS — M8949 Other hypertrophic osteoarthropathy, multiple sites: Secondary | ICD-10-CM

## 2015-08-21 DIAGNOSIS — M549 Dorsalgia, unspecified: Secondary | ICD-10-CM | POA: Diagnosis not present

## 2015-08-21 DIAGNOSIS — M159 Polyosteoarthritis, unspecified: Secondary | ICD-10-CM

## 2015-08-21 DIAGNOSIS — R109 Unspecified abdominal pain: Secondary | ICD-10-CM

## 2015-08-21 DIAGNOSIS — M150 Primary generalized (osteo)arthritis: Principal | ICD-10-CM

## 2015-08-21 LAB — POCT URINALYSIS DIPSTICK
Bilirubin, UA: NEGATIVE
Blood, UA: NEGATIVE
Glucose, UA: NEGATIVE
Ketones, UA: NEGATIVE
Leukocytes, UA: NEGATIVE
Nitrite, UA: NEGATIVE
Protein, UA: 30
Spec Grav, UA: 1.025
Urobilinogen, UA: 1
pH, UA: 5.5

## 2015-08-21 MED ORDER — DICLOFENAC SODIUM 1 % TD GEL: 4.0000 g | Freq: Four times a day (QID) | TRANSDERMAL | Status: DC

## 2015-08-21 MED ORDER — MELOXICAM 7.5 MG PO TABS: 7.5000 mg | ORAL_TABLET | Freq: Every day | ORAL | Status: DC

## 2015-08-21 NOTE — Assessment & Plan Note (Signed)
Patient is here for back pain, and concern for UTI. She denies any dysuria, hematuria, or increased frequency. Denies any fevers or chills. She says her back muscle spasm started after she fell on May 8th in the grocery store.  She was seen here 3 weeks ago, and a renal ultrasound was obtained which ruled out any renal stones. A UA with reflex micro at that time was negative for cystitis.  A urine dipstick today was negative for leukocytes or nitrites. She has some tenderness to palpation at the paraspinal muscles .  A: musculoskeletal back pain P: -advised voltaren gel -advised heating pads and ice therapy -Given mobic for 2 weeks. Asked to not take ibuprofen or celebrex when she is taking this. -Follow up in 1 month if her pain does not improve.
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
## 2015-08-21 NOTE — Patient Instructions (Signed)
Thank you for your visit today Your ultrasound was normal and did not show any kidney stones. Your urine also does not have an infection. Please take the mobic daily for 2 weeks for your back pain. Please continue to do your exercises. Please do not take ibuprofen or celebrex (celecoxib) when you take the mobic  Please use the voltaren gel as needed  Please follow up with the PCP

## 2015-08-22 NOTE — Progress Notes (Signed)
Internal Medicine Clinic Attending  Case discussed with Dr. Tiburcio Pea at the time of the visit.  We reviewed the resident's history and exam and pertinent patient test results.  I agree with the assessment, diagnosis, and plan of care documented in the resident's note. Of note while the Renal U/S cannot rule out nephrolithiasis completely there is no large stone obstructing the kidneys.  Her urine dip does not show signs of RBC and her history is less suggestive of this as a cause

## 2015-09-03 DIAGNOSIS — J449 Chronic obstructive pulmonary disease, unspecified: Secondary | ICD-10-CM | POA: Diagnosis not present

## 2015-09-07 ENCOUNTER — Other Ambulatory Visit: Payer: Self-pay | Admitting: Student in an Organized Health Care Education/Training Program

## 2015-09-10 ENCOUNTER — Other Ambulatory Visit: Payer: Self-pay

## 2015-09-10 ENCOUNTER — Other Ambulatory Visit: Payer: Self-pay | Admitting: Student in an Organized Health Care Education/Training Program

## 2015-09-10 MED ORDER — MELOXICAM 7.5 MG PO TABS: 7.5000 mg | ORAL_TABLET | Freq: Every day | ORAL | 0 refills | Status: DC

## 2015-09-10 NOTE — Telephone Encounter (Signed)
Requesting Meloxicam to be filled @ [facility] on [address].

## 2015-09-12 ENCOUNTER — Other Ambulatory Visit: Payer: Self-pay

## 2015-09-12 NOTE — Telephone Encounter (Signed)
Requesting paroxetine to be filled @ [facility] on [address].

## 2015-09-13 NOTE — Telephone Encounter (Signed)
Verified with pharmacy that there are refills available. Notified patient.

## 2015-09-23 ENCOUNTER — Ambulatory Visit (INDEPENDENT_AMBULATORY_CARE_PROVIDER_SITE_OTHER): Payer: Medicare Other | Admitting: Student in an Organized Health Care Education/Training Program

## 2015-09-23 ENCOUNTER — Encounter: Payer: Self-pay | Admitting: Student in an Organized Health Care Education/Training Program

## 2015-09-23 VITALS — BP 162/83 | HR 75 | Temp 98.0°F | Ht 68.0 in | Wt 214.6 lb

## 2015-09-23 DIAGNOSIS — Z9181 History of falling: Secondary | ICD-10-CM

## 2015-09-23 DIAGNOSIS — I10 Essential (primary) hypertension: Secondary | ICD-10-CM

## 2015-09-23 DIAGNOSIS — G4700 Insomnia, unspecified: Secondary | ICD-10-CM

## 2015-09-23 DIAGNOSIS — Z23 Encounter for immunization: Secondary | ICD-10-CM

## 2015-09-23 DIAGNOSIS — N393 Stress incontinence (female) (male): Secondary | ICD-10-CM

## 2015-09-23 DIAGNOSIS — E119 Type 2 diabetes mellitus without complications: Secondary | ICD-10-CM | POA: Diagnosis not present

## 2015-09-23 DIAGNOSIS — F329 Major depressive disorder, single episode, unspecified: Secondary | ICD-10-CM

## 2015-09-23 DIAGNOSIS — F32A Depression, unspecified: Secondary | ICD-10-CM

## 2015-09-23 LAB — POCT GLYCOSYLATED HEMOGLOBIN (HGB A1C): Hemoglobin A1C: 8.8

## 2015-09-23 LAB — GLUCOSE, CAPILLARY: Glucose-Capillary: 221 mg/dL — ABNORMAL HIGH (ref 65–99)

## 2015-09-23 NOTE — Assessment & Plan Note (Signed)
Insomnia remains a quality of life issue. Sleeps only 4 hours nightly, positive daytime drowsiness. She did not fill melatonin as prescribed in June. I advised she try this intervention now. I do not think she is a candidate for stronger sedatives.

## 2015-09-23 NOTE — Assessment & Plan Note (Signed)
Doing well, PHQ 9 score of 7 today. Had a grandson die in June that caused some acute stress. I will continue paroxetine for now, but we could consider trying to stop this medicine in the future.

## 2015-09-23 NOTE — Progress Notes (Signed)
Assessment and Plan:  See Encounters tab for problem-based medical decision making.   __________________________________________________________  HPI:  80 year old woman here for follow-up of diabetes. She reports doing fairly well at home. Lives by herself, daughter lives in [place] and checks in with her frequently. She drives herself, is independent in her other activities of daily living. She manages her own medications. She brought her pill bottles in today, many of the bottles are very old with warning labels. Metformin bottle was almost full and filled in June 2016. Synthroid bottle label was completely worn away, I had to identify the medicine with an online pill identifier. She reports sometimes using a pill organizer at home. She does all of her medications. She does say that she gets confused sometimes and can't remember if she took her pills that day. She thinks she takes metformin once a day. Denies polyuria or polydipsia. Reports good exertional capacity, no dyspnea with exertion or chest pain.   __________________________________________________________  Problem List: Patient Active Problem List   Diagnosis Date Noted  . High Risk for Falls 07/18/2015  . GERD (gastroesophageal reflux disease) 06/16/2015  . Urinary, incontinence, stress female 05/09/2015  . Insomnia 05/09/2015  . CAD (coronary artery disease) 09/17/2014  . Eczema of hand 06/21/2013  . Preventive measure 02/17/2013  . Obstructive sleep apnea 12/25/2011  . Hypothyroidism 01/21/2006  . Depression 01/21/2006  . Essential hypertension 01/21/2006  . Chronic diastolic heart failure ([place]) 01/21/2006  . COPD (chronic obstructive pulmonary disease) ([place]) 01/21/2006  . Osteoarthritis 01/21/2006  . Diabetes mellitus ([place]) 10/04/1998    Medications: Reconciled today in Epic __________________________________________________________  Physical Exam:  Vital Signs: Vitals:   09/23/15 1042  BP: (!) 162/83    Pulse: 75  Temp: 98 F (36.7 C)  TempSrc: Oral  Weight: 214 lb 9.6 oz (97.3 kg)  Height: 5\' 8"  (1.727 m)    Gen: elderly appearing woman CV: RRR, no murmurs Pulm: Normal effort, CTA throughout, no wheezing Abd: Soft, NT, ND, normal BS.  Ext: Warm, no edema, normal joints Skin: No atypical appearing moles. No rashes

## 2015-09-23 NOTE — Patient Instructions (Signed)
1. Please take all your medicines according to this list.   2. We will work to get the rolling walker for you that I prescribed in June.   3. I will try to get a nurse to visit you at home to organize your medications.   4. Call me if your knee pain gets worse, or if you have more problems walking.

## 2015-09-23 NOTE — Assessment & Plan Note (Signed)
Blood pressure is above goal today, but there is a lot of confusion about her medicine regimen. Compliance is an issue here. Plan is to arrange for a home health RN visit to help with medicine compliance and organization. Continue amlodipine, lisinopril, and carvedilol for now.

## 2015-09-23 NOTE — Assessment & Plan Note (Signed)
A1c is 8.8% today, above our goal. I think there is a lot of confusion about her medicines. The metformin bottle she brought is over a year old, and almost full. Last dispense from [facility] in [place] is November 2016. The patient is not sure how often she takes it, says she forgets sometimes. I am going to refer her to our geriatric task force and Education officer, museum. I think she would benefit from a home visit for medication reconcilliation, make sure she has a pill box please. We will continue metformin 1000mg  bid for now, and recheck A1c in three months.

## 2015-09-23 NOTE — Assessment & Plan Note (Signed)
Insurance would not cover adult diapers. I think she is too high risk for fall to try anticholinergic meds. We talked about supportive care with timed voiding.

## 2015-09-25 DIAGNOSIS — J449 Chronic obstructive pulmonary disease, unspecified: Secondary | ICD-10-CM | POA: Diagnosis not present

## 2015-09-25 DIAGNOSIS — R296 Repeated falls: Secondary | ICD-10-CM | POA: Diagnosis not present

## 2015-09-25 DIAGNOSIS — G4733 Obstructive sleep apnea (adult) (pediatric): Secondary | ICD-10-CM | POA: Diagnosis not present

## 2015-09-25 DIAGNOSIS — M79604 Pain in right leg: Secondary | ICD-10-CM | POA: Diagnosis not present

## 2015-09-26 ENCOUNTER — Other Ambulatory Visit: Payer: Self-pay

## 2015-09-26 ENCOUNTER — Ambulatory Visit: Payer: Medicare Other | Admitting: Pharmacist

## 2015-09-26 DIAGNOSIS — E139 Other specified diabetes mellitus without complications: Secondary | ICD-10-CM

## 2015-09-26 DIAGNOSIS — F32A Depression, unspecified: Secondary | ICD-10-CM

## 2015-09-26 DIAGNOSIS — F329 Major depressive disorder, single episode, unspecified: Secondary | ICD-10-CM

## 2015-09-26 DIAGNOSIS — I2510 Atherosclerotic heart disease of native coronary artery without angina pectoris: Secondary | ICD-10-CM

## 2015-09-26 DIAGNOSIS — R109 Unspecified abdominal pain: Secondary | ICD-10-CM

## 2015-09-26 DIAGNOSIS — I10 Essential (primary) hypertension: Secondary | ICD-10-CM

## 2015-09-26 DIAGNOSIS — J449 Chronic obstructive pulmonary disease, unspecified: Secondary | ICD-10-CM

## 2015-09-26 DIAGNOSIS — R10A1 Flank pain, right side: Secondary | ICD-10-CM

## 2015-09-26 DIAGNOSIS — M159 Polyosteoarthritis, unspecified: Secondary | ICD-10-CM

## 2015-09-26 DIAGNOSIS — K219 Gastro-esophageal reflux disease without esophagitis: Secondary | ICD-10-CM

## 2015-09-26 DIAGNOSIS — J438 Other emphysema: Secondary | ICD-10-CM

## 2015-09-26 DIAGNOSIS — E039 Hypothyroidism, unspecified: Secondary | ICD-10-CM

## 2015-09-26 MED ORDER — AMLODIPINE BESYLATE 5 MG PO TABS: 5.0000 mg | ORAL_TABLET | Freq: Every day | ORAL | 3 refills | Status: DC

## 2015-09-26 MED ORDER — METFORMIN HCL 1000 MG PO TABS: 1000.0000 mg | ORAL_TABLET | Freq: Two times a day (BID) | ORAL | 3 refills | Status: DC

## 2015-09-26 MED ORDER — LISINOPRIL 20 MG PO TABS: 20.0000 mg | ORAL_TABLET | Freq: Every day | ORAL | 3 refills | Status: DC

## 2015-09-26 MED ORDER — FLUTICASONE-SALMETEROL 250-50 MCG/DOSE IN AEPB: 1.0000 | INHALATION_SPRAY | Freq: Two times a day (BID) | RESPIRATORY_TRACT | 3 refills | Status: DC

## 2015-09-26 MED ORDER — LEVOTHYROXINE SODIUM 88 MCG PO TABS: ORAL_TABLET | ORAL | 3 refills | Status: DC

## 2015-09-26 MED ORDER — MELOXICAM 7.5 MG PO TABS: 7.5000 mg | ORAL_TABLET | Freq: Every day | ORAL | 0 refills | Status: DC

## 2015-09-26 MED ORDER — TIOTROPIUM BROMIDE MONOHYDRATE 18 MCG IN CAPS: ORAL_CAPSULE | RESPIRATORY_TRACT | 3 refills | Status: DC

## 2015-09-26 MED ORDER — CARVEDILOL 25 MG PO TABS: 25.0000 mg | ORAL_TABLET | Freq: Two times a day (BID) | ORAL | 3 refills | Status: DC

## 2015-09-26 MED ORDER — PAROXETINE HCL 30 MG PO TABS: 30.0000 mg | ORAL_TABLET | Freq: Every day | ORAL | 3 refills | Status: DC

## 2015-09-26 MED ORDER — ATORVASTATIN CALCIUM 40 MG PO TABS: ORAL_TABLET | ORAL | 3 refills | Status: DC

## 2015-09-26 MED ORDER — OMEPRAZOLE 20 MG PO CPDR: 20.0000 mg | DELAYED_RELEASE_CAPSULE | Freq: Every day | ORAL | 3 refills | Status: DC

## 2015-09-26 MED ORDER — ASPIRIN EC 81 MG PO TBEC: 81.0000 mg | DELAYED_RELEASE_TABLET | Freq: Every day | ORAL | 3 refills | Status: DC

## 2015-09-26 NOTE — Progress Notes (Signed)
Caitlyn Aguirre is a 35 yoF who presents in good spirits to the IM clinic today for a medication review and education session. Note that this encounter is labeled for anticoagulation, but that is an error and this patient is not on an anticoagulant.   Today, I reviewed the indication and dosing instructions for each medication on her list, set up this week's pill box, and went over any medication issues identified. The following medication administration issues were identified: - Advair was being taken only once daily and the way the patient was hold the diskus led to wasted doses --> talked about holding Advair like a "hamburger" and stressed the importance of taking the medication twice every day - ProAir HFA is being used daily --> discussed that ProAir (her "red inhaler") is used only as needed for shortness of breath, and that she doesn't have to use it every day if not experiencing SOB - Amlodipine was refilled for a 90 day supply in Feb 2017 --> addressed by setting up a pill box, giving her a medication list, and reviewing why amlodipine is used - Carvedilol was being taken once daily --> discussed the importance of carvedilol for her heart and stressed twice daily dosing - Levothyroxine was being taken with coffee in the morning, which may reduce it's absorption --> discussed taking levothyroxine on an empty stomach 30-60 min before all other meds and with only water - Metformin was being taken once daily -->we discussed the importance of metformin for her diabetes and stressed twice daily dosing   Refills needed: - Lisinopril (called into pharmacy) - Atorvastatin (called into pharmacy) - Meloxicam (no refills, request sent to Dr. Evette Doffing)  Patient requested that all medications be switched to a 90 day supply, therefore I will send a message to Dr. Evette Doffing about this.   Overall, [name] has a fair understanding of her medications and disease states, but seems slightly overwhelmed with her complicated  regimen. I set her up with a twice daily medication pill box, and suggested that her daughter help her each week with setting this up. She agreed that this was a good idea. I also provided her with a medication list that divided the medications into the time of day that she should take them to aid in her setting up the pill box each week. A business card with Dr. Julianne Rice phone number was provided and the patient was instructed to call with any questions/issues. Patient voiced understanding of her medication regimen, and that she is eager to take her medications as directed.   Belia Heman, PharmD PGY1 Resident 09/26/2015 12:09 PM

## 2015-09-26 NOTE — Patient Instructions (Signed)
Ms. Christinna Chattman [birth year] My Medication List Updated: 09/26/2015  Before all other Levothyroxine 88 mcg tablet (thyroid)  AM Advair 1 puff (COPD) Amlodipine 5mg  tablet (blood pressure) Aspirin 81 mg tablet (heart health) Carvedilol 25 mg tablet (blood pressure) Lisinopril 20 mg tablet (blood pressure) Metformin 1000 mg tablet (diabetes) Paroxetine 30 mg tablet (mood) Spiriva inhale 1 capsule with 2 puffs (COPD)  PM Advair 1 puff (COPD) Carvedilol 25 mg tablet (blood pressure) Metformin 1000 mg tablet (diabetes) Omeprazole 20 mg capsule (acid reflux/heartburn)  6 PM Atorvastatin 40 mg tablet (cholesterol)  Bedtime Melatonin 10 mg tablet (sleep)   Reminders:  - Hold Advair like a hamburger - ProAir (red inhaler) is an "as needed" medication - only use when short of breath - Take omeprazole in the evening, separate from levothyroxine - Take levothyroxine in the morning on an empty stomach, and wait 30 minutes before eating or drinking anything besides water Preferred Pharmacy: - [facility]: [address] AT [address], [place], [place] - [phone] Garrett, PharmD - [phone]

## 2015-09-26 NOTE — Telephone Encounter (Signed)
Per Dr. Evette Doffing - okay to send 90 day supply of all chronic medications to patient preferred pharmacy on request of patient  Pharmacy called to notify of switch to 90 day supply and to not fill meds until next due date  Belia Heman, PharmD PGY1 Resident 09/26/2015 2:53 PM

## 2015-10-04 DIAGNOSIS — J449 Chronic obstructive pulmonary disease, unspecified: Secondary | ICD-10-CM | POA: Diagnosis not present

## 2015-10-21 ENCOUNTER — Other Ambulatory Visit: Payer: Self-pay | Admitting: Student in an Organized Health Care Education/Training Program

## 2015-10-21 DIAGNOSIS — M159 Polyosteoarthritis, unspecified: Secondary | ICD-10-CM

## 2015-10-21 NOTE — Telephone Encounter (Signed)
She was only supposed to be on Meloxicam for 2 weeks and then follow up if pain not better.  Would advise an ACC or PCP follow up appointment before prescribing more pain medication.    Thanks

## 2015-10-22 NOTE — Telephone Encounter (Addendum)
Called pt - informed she "was only supposed to be on Meloxicam for 2 weeks and then follow up if pain not better." per Dr Daryll Drown.  Pt stated she takes "Meloxicam for arthritis pain; same pain. She had it refilled x 2.  Or is there something else she can take but it has been working".  Thanks

## 2015-10-22 NOTE — Telephone Encounter (Signed)
Called pt and informed her Dr Doristine Section response. Wanted to know when Dr Evette Doffing will be back; told her she could be seen in [facility]. Pt scheduled an appt for tomorrow in [facility].

## 2015-10-22 NOTE — Telephone Encounter (Signed)
If pain is not controlled or worsened, she should come in to see her PCP.  I cannot treat over phone as I do not know what the nature of her pain is.  Thanks!  I can only go by what is documented in her chart.

## 2015-10-23 ENCOUNTER — Ambulatory Visit (INDEPENDENT_AMBULATORY_CARE_PROVIDER_SITE_OTHER): Payer: Medicare Other | Admitting: Internal Medicine

## 2015-10-23 DIAGNOSIS — M1990 Unspecified osteoarthritis, unspecified site: Secondary | ICD-10-CM | POA: Diagnosis not present

## 2015-10-23 DIAGNOSIS — G4700 Insomnia, unspecified: Secondary | ICD-10-CM | POA: Diagnosis not present

## 2015-10-23 DIAGNOSIS — M159 Polyosteoarthritis, unspecified: Secondary | ICD-10-CM

## 2015-10-23 DIAGNOSIS — Z760 Encounter for issue of repeat prescription: Secondary | ICD-10-CM

## 2015-10-23 DIAGNOSIS — Z79899 Other long term (current) drug therapy: Secondary | ICD-10-CM | POA: Diagnosis not present

## 2015-10-23 MED ORDER — MELOXICAM 7.5 MG PO TABS: 7.5000 mg | ORAL_TABLET | Freq: Every day | ORAL | 0 refills | Status: DC

## 2015-10-23 NOTE — Progress Notes (Signed)
Internal Medicine Clinic Attending  I saw and evaluated the patient.  I personally confirmed the key portions of the history and exam documented by Dr. Johnson and I reviewed pertinent patient test results.  The assessment, diagnosis, and plan were formulated together and I agree with the documentation in the resident's note.  
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
## 2015-10-23 NOTE — Assessment & Plan Note (Signed)
He stated difficulty sleeping more than a few hours at night, unable to nap during the day, feels tired.  Tried melatonin suggested at last visit, but taking it incorrectly. Mentions chewing one to 2 tablets an hour before bedtime and then continuing with normal activities and chores at home while waiting for it to "hit her." States she is to take Ambien but this was discontinued by her providers. No difficulties with inappropriately falling asleep during the day. Endorses some degree of depression over semi-recent loss of granddaughter to brain tumor.  Plan: -Discussed need to take melatonin 30 minutes before bedtime and that she needs to relax and get ready for bed (subtle medication effect).  -Provided list of sleep hygiene strategies

## 2015-10-23 NOTE — Assessment & Plan Note (Signed)
Provided a refill of meloxicam, which she continues to use along with Voltaren gel for adequate symptom relief. Denies any gastritis symptoms or melena

## 2015-10-23 NOTE — Patient Instructions (Addendum)
Remember to take your melatonin about 30 minutes before bedtime and to try to relax and fall asleep after taking this medication - avoid continued activity and doing tasks around the house.   Practice Good Sleep Hygiene Here are some suggestions Avoid napping during the day. It can disturb the normal pattern of sleep and wakefulness.  Avoid stimulants such as caffeine, nicotine, and alcohol too close to bedtime. While alcohol is well known to speed the onset of sleep, it disrupts sleep in the second half as the body begins to metabolize the alcohol, causing arousal.  Exercise can promote good sleep. Vigorous exercise should be taken in the morning or late afternoon. A relaxing exercise, like yoga, can be done before bed to help initiate a restful night's sleep. Food can be disruptive right before sleep. Stay away from large meals close to bedtime. Also dietary changes can cause sleep problems, if someone is struggling with a sleep problem, it's not a good time to start experimenting with spicy dishes. And, remember, chocolate has caffeine.  Ensure adequate exposure to natural light. This is particularly important for older people who may not venture outside as frequently as children and adults. Light exposure helps maintain a healthy sleep-wake cycle.  Establish a regular relaxing bedtime routine. Try to avoid emotionally upsetting conversations and activities before trying to go to sleep. Don't dwell on, or bring your problems to bed.  Associate your bed with sleep. It's not a good idea to use your bed to watch TV, listen to the radio, or read.  Make sure that the sleep environment is pleasant and relaxing. The bed should be comfortable, the room should not be too hot or cold, or too bright.

## 2015-11-03 DIAGNOSIS — J449 Chronic obstructive pulmonary disease, unspecified: Secondary | ICD-10-CM | POA: Diagnosis not present

## 2015-11-07 ENCOUNTER — Encounter (HOSPITAL_COMMUNITY): Payer: Self-pay | Admitting: Emergency Medicine

## 2015-11-07 ENCOUNTER — Ambulatory Visit (HOSPITAL_COMMUNITY)
Admission: EM | Admit: 2015-11-07 | Discharge: 2015-11-07 | Disposition: A | Discharge status: Home / Self Care | Payer: Medicare Other | Attending: Internal Medicine | Admitting: Internal Medicine

## 2015-11-07 DIAGNOSIS — N368 Other specified disorders of urethra: Secondary | ICD-10-CM

## 2015-11-07 DIAGNOSIS — R300 Dysuria: Secondary | ICD-10-CM

## 2015-11-07 DIAGNOSIS — B373 Candidiasis of vulva and vagina: Secondary | ICD-10-CM | POA: Diagnosis not present

## 2015-11-07 DIAGNOSIS — B3731 Acute candidiasis of vulva and vagina: Secondary | ICD-10-CM

## 2015-11-07 LAB — POCT URINALYSIS DIP (DEVICE)
Glucose, UA: NEGATIVE mg/dL
Hgb urine dipstick: NEGATIVE
Leukocytes, UA: NEGATIVE
Nitrite: NEGATIVE
Protein, ur: 30 mg/dL — AB
Specific Gravity, Urine: 1.015 (ref 1.005–1.030)
Urobilinogen, UA: 2 mg/dL — ABNORMAL HIGH (ref 0.0–1.0)
pH: 6 (ref 5.0–8.0)

## 2015-11-07 MED ORDER — FLUCONAZOLE 150 MG PO TABS: ORAL_TABLET | ORAL | 0 refills | Status: DC

## 2015-11-07 NOTE — Discharge Instructions (Signed)
The most likely reason for irritation of the urethra, where the urine comes out of the body is due to a yeast infection. Keep the area as dry as possible. Apply Monistat cream once a day to this area. Take the type that can medicine as directed.
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
## 2015-11-07 NOTE — ED Triage Notes (Signed)
Pt has been suffering from burning with urination since Monday.  She denies any other symptoms.

## 2015-11-15 ENCOUNTER — Ambulatory Visit (HOSPITAL_COMMUNITY)
Admission: EM | Admit: 2015-11-15 | Discharge: 2015-11-15 | Disposition: A | Discharge status: Nursing Home (Includes ICF) | Payer: Medicare Other | Attending: Emergency Medicine | Admitting: Emergency Medicine

## 2015-11-15 ENCOUNTER — Emergency Department (HOSPITAL_COMMUNITY)
Admission: EM | Admit: 2015-11-15 | Discharge: 2015-11-15 | Disposition: A | Discharge status: Home / Self Care | Payer: Medicare Other | Attending: Emergency Medicine | Admitting: Emergency Medicine

## 2015-11-15 ENCOUNTER — Emergency Department (HOSPITAL_COMMUNITY): Payer: Medicare Other

## 2015-11-15 ENCOUNTER — Encounter (HOSPITAL_COMMUNITY): Payer: Self-pay | Admitting: Nurse Practitioner

## 2015-11-15 ENCOUNTER — Encounter (HOSPITAL_COMMUNITY): Payer: Self-pay | Admitting: *Deleted

## 2015-11-15 DIAGNOSIS — I2510 Atherosclerotic heart disease of native coronary artery without angina pectoris: Secondary | ICD-10-CM | POA: Diagnosis not present

## 2015-11-15 DIAGNOSIS — R69 Illness, unspecified: Secondary | ICD-10-CM

## 2015-11-15 DIAGNOSIS — J069 Acute upper respiratory infection, unspecified: Secondary | ICD-10-CM | POA: Insufficient documentation

## 2015-11-15 DIAGNOSIS — R05 Cough: Secondary | ICD-10-CM | POA: Diagnosis not present

## 2015-11-15 DIAGNOSIS — Z955 Presence of coronary angioplasty implant and graft: Secondary | ICD-10-CM | POA: Insufficient documentation

## 2015-11-15 DIAGNOSIS — R0600 Dyspnea, unspecified: Secondary | ICD-10-CM | POA: Diagnosis not present

## 2015-11-15 DIAGNOSIS — R0602 Shortness of breath: Secondary | ICD-10-CM | POA: Diagnosis not present

## 2015-11-15 DIAGNOSIS — E119 Type 2 diabetes mellitus without complications: Secondary | ICD-10-CM | POA: Insufficient documentation

## 2015-11-15 DIAGNOSIS — Z8673 Personal history of transient ischemic attack (TIA), and cerebral infarction without residual deficits: Secondary | ICD-10-CM | POA: Insufficient documentation

## 2015-11-15 DIAGNOSIS — I110 Hypertensive heart disease with heart failure: Secondary | ICD-10-CM | POA: Diagnosis not present

## 2015-11-15 DIAGNOSIS — J449 Chronic obstructive pulmonary disease, unspecified: Secondary | ICD-10-CM | POA: Insufficient documentation

## 2015-11-15 DIAGNOSIS — R059 Cough, unspecified: Secondary | ICD-10-CM

## 2015-11-15 DIAGNOSIS — E039 Hypothyroidism, unspecified: Secondary | ICD-10-CM | POA: Diagnosis not present

## 2015-11-15 DIAGNOSIS — I252 Old myocardial infarction: Secondary | ICD-10-CM | POA: Insufficient documentation

## 2015-11-15 DIAGNOSIS — I5032 Chronic diastolic (congestive) heart failure: Secondary | ICD-10-CM | POA: Diagnosis not present

## 2015-11-15 DIAGNOSIS — Z87891 Personal history of nicotine dependence: Secondary | ICD-10-CM | POA: Diagnosis not present

## 2015-11-15 DIAGNOSIS — J111 Influenza due to unidentified influenza virus with other respiratory manifestations: Secondary | ICD-10-CM

## 2015-11-15 LAB — CBC
HCT: 36.8 % (ref 36.0–46.0)
Hemoglobin: 12.3 g/dL (ref 12.0–15.0)
MCH: 28.9 pg (ref 26.0–34.0)
MCHC: 33.4 g/dL (ref 30.0–36.0)
MCV: 86.6 fL (ref 78.0–100.0)
Platelets: 192 10*3/uL (ref 150–400)
RBC: 4.25 MIL/uL (ref 3.87–5.11)
RDW: 14.3 % (ref 11.5–15.5)
WBC: 7.8 10*3/uL (ref 4.0–10.5)

## 2015-11-15 LAB — BASIC METABOLIC PANEL
Anion gap: 10 (ref 5–15)
BUN: 5 mg/dL — ABNORMAL LOW (ref 6–20)
CO2: 24 mmol/L (ref 22–32)
Calcium: 9.7 mg/dL (ref 8.9–10.3)
Chloride: 107 mmol/L (ref 101–111)
Creatinine, Ser: 0.83 mg/dL (ref 0.44–1.00)
GFR calc Af Amer: >60 mL/min (ref >60)
GFR calc non Af Amer: >60 mL/min (ref >60)
Glucose, Bld: 119 mg/dL — ABNORMAL HIGH (ref 65–99)
Potassium: 3.7 mmol/L (ref 3.5–5.1)
Sodium: 141 mmol/L (ref 135–145)

## 2015-11-15 LAB — I-STAT CG4 LACTIC ACID, ED: Lactic Acid, Venous: 1.74 mmol/L (ref 0.5–1.9)

## 2015-11-15 LAB — BRAIN NATRIURETIC PEPTIDE: B Natriuretic Peptide: 24.3 pg/mL (ref 0.0–100.0)

## 2015-11-15 LAB — I-STAT TROPONIN, ED: Troponin i, poc: 0 ng/mL (ref 0.00–0.08)

## 2015-11-15 MED ORDER — ALBUTEROL SULFATE HFA 108 (90 BASE) MCG/ACT IN AERS: 1.0000 | INHALATION_SPRAY | Freq: Four times a day (QID) | RESPIRATORY_TRACT | 0 refills | Status: DC | PRN

## 2015-11-15 MED ORDER — IPRATROPIUM-ALBUTEROL 0.5-2.5 (3) MG/3ML IN SOLN
3.0000 mL | Freq: Once | RESPIRATORY_TRACT | Status: AC
  Administered 2015-11-15: 3 mL via RESPIRATORY_TRACT

## 2015-11-15 MED ORDER — IPRATROPIUM-ALBUTEROL 0.5-2.5 (3) MG/3ML IN SOLN
RESPIRATORY_TRACT | Status: AC
  Filled 2015-11-15: qty 3

## 2015-11-15 MED ORDER — ACETAMINOPHEN 325 MG PO TABS
ORAL_TABLET | ORAL | Status: AC
  Filled 2015-11-15: qty 2

## 2015-11-15 MED ORDER — ACETAMINOPHEN 325 MG PO TABS
650.0000 mg | ORAL_TABLET | Freq: Once | ORAL | Status: AC
  Administered 2015-11-15: 650 mg via ORAL

## 2015-11-15 MED ORDER — IPRATROPIUM-ALBUTEROL 0.5-2.5 (3) MG/3ML IN SOLN
3.0000 mL | Freq: Once | RESPIRATORY_TRACT | Status: AC
  Administered 2015-11-15: 3 mL via RESPIRATORY_TRACT
  Filled 2015-11-15: qty 3

## 2015-11-15 MED ORDER — BENZONATATE 100 MG PO CAPS: 100.0000 mg | ORAL_CAPSULE | Freq: Three times a day (TID) | ORAL | 0 refills | Status: DC
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
## 2015-11-15 NOTE — ED Triage Notes (Signed)
PT  HAS   MULTIPLE    SYMPTOMS  TO INCLUDE    COUGH  /  CONGESTED  AS  WELL      HEADACHE       PT  REPORTS      HEADACHE   AND  PAIN   IN  CHEST  WHEN  SHE  COUGHS       HER  DAUGHTER BROUGHT  HER  TO  THE  CLINIC

## 2015-11-15 NOTE — ED Notes (Signed)
Report  Phoned  To  [facility]  Nurse   First

## 2015-11-15 NOTE — Discharge Instructions (Signed)
Go to the ED right now. I'm concerned that you have a pneumonia and/or the flu.

## 2015-11-15 NOTE — Discharge Instructions (Signed)
You were seen in the ED today with cough, runny nose, and difficulty breathing. You reported feeling much better after breathing treatments. We have refilled your albuterol inhaler and given a medication for cough. You can take Tylenol for fever and follow up with the PCP on Monday.   Return to the ED with any worsening chest pain, difficulty breathing, fever, chills, or other concerning symptoms for you.

## 2015-11-15 NOTE — ED Triage Notes (Addendum)
Pt presents with c/o body aches and sob. She reports 1 week history of malaise, chills, sweats, sob, productive cough, headaches, body aches. She tried cough syrup with no relief. She is alert, breathing easily at rest, does appear to get SOB with exertion. She was sent from [facility] today for further evaluation of her symptoms
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
## 2015-11-25 ENCOUNTER — Ambulatory Visit: Payer: Medicare Other

## 2015-11-25 ENCOUNTER — Encounter: Payer: Self-pay | Admitting: Student in an Organized Health Care Education/Training Program

## 2015-12-02 ENCOUNTER — Other Ambulatory Visit: Payer: Self-pay | Admitting: Dietician

## 2015-12-02 ENCOUNTER — Telehealth: Payer: Self-pay | Admitting: *Deleted

## 2015-12-02 ENCOUNTER — Encounter: Payer: Self-pay | Admitting: Student in an Organized Health Care Education/Training Program

## 2015-12-02 ENCOUNTER — Ambulatory Visit (INDEPENDENT_AMBULATORY_CARE_PROVIDER_SITE_OTHER): Payer: Medicare Other | Admitting: Student in an Organized Health Care Education/Training Program

## 2015-12-02 DIAGNOSIS — R05 Cough: Secondary | ICD-10-CM | POA: Diagnosis not present

## 2015-12-02 DIAGNOSIS — Z79899 Other long term (current) drug therapy: Secondary | ICD-10-CM

## 2015-12-02 DIAGNOSIS — J441 Chronic obstructive pulmonary disease with (acute) exacerbation: Secondary | ICD-10-CM

## 2015-12-02 DIAGNOSIS — J449 Chronic obstructive pulmonary disease, unspecified: Secondary | ICD-10-CM | POA: Diagnosis not present

## 2015-12-02 DIAGNOSIS — E119 Type 2 diabetes mellitus without complications: Secondary | ICD-10-CM

## 2015-12-02 DIAGNOSIS — Z87891 Personal history of nicotine dependence: Secondary | ICD-10-CM | POA: Diagnosis not present

## 2015-12-02 DIAGNOSIS — Z7951 Long term (current) use of inhaled steroids: Secondary | ICD-10-CM

## 2015-12-02 MED ORDER — PREDNISONE 20 MG PO TABS: 40.0000 mg | ORAL_TABLET | Freq: Every day | ORAL | 0 refills | Status: AC

## 2015-12-02 MED ORDER — AZITHROMYCIN 250 MG PO TABS: ORAL_TABLET | ORAL | 0 refills | Status: AC

## 2015-12-02 MED ORDER — ONETOUCH VERIO W/DEVICE KIT: 1.0000 | PACK | Freq: Two times a day (BID) | 1 refills | Status: DC

## 2015-12-02 MED ORDER — GLUCOSE BLOOD VI STRP: ORAL_STRIP | 12 refills | Status: DC

## 2015-12-02 MED ORDER — ONETOUCH DELICA LANCETS FINE MISC: 4 refills | Status: DC

## 2015-12-02 NOTE — Patient Instructions (Signed)
Chronic Obstructive Pulmonary Disease Chronic obstructive pulmonary disease (COPD) is a common lung condition in which airflow from the lungs is limited. COPD is a general term that can be used to describe many different lung problems that limit airflow, including both chronic bronchitis and emphysema. If you have COPD, your lung function will probably never return to normal, but there are measures you can take to improve lung function and make yourself feel better. CAUSES   Smoking (common).  Exposure to secondhand smoke.  Genetic problems.  Chronic inflammatory lung diseases or recurrent infections. SYMPTOMS  Shortness of breath, especially with physical activity.  Deep, persistent (chronic) cough with a large amount of thick mucus.  Wheezing.  Rapid breaths (tachypnea).  Gray or bluish discoloration (cyanosis) of the skin, especially in your fingers, toes, or lips.  Fatigue.  Weight loss.  Frequent infections or episodes when breathing symptoms become much worse (exacerbations).  Chest tightness. DIAGNOSIS Your health care provider will take a medical history and perform a physical examination to diagnose COPD. Additional tests for COPD may include:  Lung (pulmonary) function tests.  Chest X-ray.  CT scan.  Blood tests. TREATMENT  Treatment for COPD may include:  Inhaler and nebulizer medicines. These help manage the symptoms of COPD and make your breathing more comfortable.  Supplemental oxygen. Supplemental oxygen is only helpful if you have a low oxygen level in your blood.  Exercise and physical activity. These are beneficial for nearly all people with COPD.  Lung surgery or transplant.  Nutrition therapy to gain weight, if you are underweight.  Pulmonary rehabilitation. This may involve working with a team of health care providers and specialists, such as respiratory, occupational, and physical therapists. HOME CARE INSTRUCTIONS  Take all medicines  (inhaled or pills) as directed by your health care provider.  Avoid over-the-counter medicines or cough syrups that dry up your airway (such as antihistamines) and slow down the elimination of secretions unless instructed otherwise by your health care provider.  If you are a smoker, the most important thing that you can do is stop smoking. Continuing to smoke will cause further lung damage and breathing trouble. Ask your health care provider for help with quitting smoking. He or she can direct you to community resources or hospitals that provide support.  Avoid exposure to irritants such as smoke, chemicals, and fumes that aggravate your breathing.  Use oxygen therapy and pulmonary rehabilitation if directed by your health care provider. If you require home oxygen therapy, ask your health care provider whether you should purchase a pulse oximeter to measure your oxygen level at home.  Avoid contact with individuals who have a contagious illness.  Avoid extreme temperature and humidity changes.  Eat healthy foods. Eating smaller, more frequent meals and resting before meals may help you maintain your strength.  Stay active, but balance activity with periods of rest. Exercise and physical activity will help you maintain your ability to do things you want to do.  Preventing infection and hospitalization is very important when you have COPD. Make sure to receive all the vaccines your health care provider recommends, especially the pneumococcal and influenza vaccines. Ask your health care provider whether you need a pneumonia vaccine.  Learn and use relaxation techniques to manage stress.  Learn and use controlled breathing techniques as directed by your health care provider. Controlled breathing techniques include:  Pursed lip breathing. Start by breathing in (inhaling) through your nose for 1 second. Then, purse your lips as if you were   going to whistle and breathe out (exhale) through the  pursed lips for 2 seconds.  Diaphragmatic breathing. Start by putting one hand on your abdomen just above your waist. Inhale slowly through your nose. The hand on your abdomen should move out. Then purse your lips and exhale slowly. You should be able to feel the hand on your abdomen moving in as you exhale.  Learn and use controlled coughing to clear mucus from your lungs. Controlled coughing is a series of short, progressive coughs. The steps of controlled coughing are: 1. Lean your head slightly forward. 2. Breathe in deeply using diaphragmatic breathing. 3. Try to hold your breath for 3 seconds. 4. Keep your mouth slightly open while coughing twice. 5. Spit any mucus out into a tissue. 6. Rest and repeat the steps once or twice as needed. SEEK MEDICAL CARE IF:  You are coughing up more mucus than usual.  There is a change in the color or thickness of your mucus.  Your breathing is more labored than usual.  Your breathing is faster than usual. SEEK IMMEDIATE MEDICAL CARE IF:  You have shortness of breath while you are resting.  You have shortness of breath that prevents you from:  Being able to talk.  Performing your usual physical activities.  You have chest pain lasting longer than 5 minutes.  Your skin color is more cyanotic than usual.  You measure low oxygen saturations for longer than 5 minutes with a pulse oximeter. MAKE SURE YOU:  Understand these instructions.  Will watch your condition.  Will get help right away if you are not doing well or get worse.   This information is not intended to replace advice given to you by your health care provider. Make sure you discuss any questions you have with your health care provider.   Document Released: 10/29/2004 Document Revised: 02/09/2014 Document Reviewed: 09/15/2012 Elsevier Interactive Patient Education 2016 [employer]  

## 2015-12-02 NOTE — Telephone Encounter (Signed)
WALK-IN Pt presents to front desk with c/o congestion, wheezing, general malaise for "several weeks" She is coughing thick mucous up, cough is bubbly, crackly, possibly some expiratory wheezes heard when pt speaking. States she has been to ED and "just not getting better" Spoke w/ dr Evette Doffing, will add to his am schedule, informed charsetta, doriss. In with a pt, ask lela to have her see me after she has finished

## 2015-12-02 NOTE — Assessment & Plan Note (Addendum)
Patient with smoking history and diagnosis of COPD presents with increasing productive cough and dyspnea with exertion. This followed an upper respiratory infection. Is most consistent with COPD exacerbation. Plan is to treat with prednisone 40 mg for 5 days and azithromycin 250 mg for 5 days. Exam is otherwise reassuring so I don't think we need chest imaging, low suspicion for pneumonia. Continue home treatment with Tiotropium, Advair, and as needed albuterol.

## 2015-12-02 NOTE — Telephone Encounter (Signed)
Steroid-Induced Hyperglycemia Prevention and Management Caitlyn Aguirre is a 80 y.o. female who meets criteria for [facility] quality improvement program (diabetes patient prescribed short course of steroids).  A/P Current Regimen  Patient prescribed prednisone 40 mg daily x 5 days, currently on day 1 of therapy. Patient taking prednisone in the AM  Prednisone indication: COPD  Current DM regimen metformin 100 mg BID  Home BG Monitoring  Patient does not have a meter at home and does not check BG at home. Meter was supplied[facility]   A1C prior to steroid course 8.8%  S/Sx of hyper- or hypoglycemia: none, none  Medication Management  Asked her to take prednisone dose to AM   Patient Education  Advised patient to monitor BG while on steroid therapy (at least twice daily prior to first 2 meals of the day).    Follow-up daily while on steroids  2 weeks after steroids suggested  Caitlyn Aguirre, [name] 4:32 PM 12/02/2015

## 2015-12-02 NOTE — Progress Notes (Signed)
Assessment and Plan:  See Encounters tab for problem-based medical decision making.   __________________________________________________________  HPI:  80 year old woman comes in today because of cough. The patient reports having upper respiratory infection for over 2 weeks. She has experienced productive cough with white sputum that is more in volume than usual. She is also feeling more shortness of breath with exertion. She would to the emergency department on 10/13, had a chest x-ray that was reassuring and was given supportive care. She reports initially feeling better, but over the last week her symptoms have worsened. She has significant coughing at night. Denies any fevers but she does have night sweats, says she soaks through her close and has to change halfway through the night. Eating and drinking well. Denies lightheadedness or falls. Denies chest pain. Denies hemoptysis. No other sick contacts. Reports good compliance with her other medications. Using albuterol several times a day especially when she starts coughing. Her daughter accompanied her, they live together, the daughter is very worried about her.  __________________________________________________________  Problem List: Patient Active Problem List   Diagnosis Date Noted  . High Risk for Falls 07/18/2015  . GERD (gastroesophageal reflux disease) 06/16/2015  . Urinary, incontinence, stress female 05/09/2015  . Insomnia 05/09/2015  . CAD (coronary artery disease) 09/17/2014  . Eczema of hand 06/21/2013  . Preventive measure 02/17/2013  . Obstructive sleep apnea 12/25/2011  . Hypothyroidism 01/21/2006  . Depression 01/21/2006  . Essential hypertension 01/21/2006  . Chronic diastolic heart failure ([place]) 01/21/2006  . COPD exacerbation ([place]) 01/21/2006  . Osteoarthritis 01/21/2006  . Diabetes mellitus ([place]) 10/04/1998    Medications: Reconciled today in  Epic __________________________________________________________  Physical Exam:  Vital Signs: Vitals:   12/02/15 1116  BP: (!) 160/80  Pulse: 70  Temp: 98.2 F (36.8 C)  TempSrc: Oral  SpO2: 98%  Weight: 211 lb 9.6 oz (96 kg)  Height: 5\' 8"  (1.727 m)    Gen: Well appearing, NAD, tired appearing CV: RRR, no murmurs Pulm: Tachypnea, good air movement with small inspiratory wheeze on the right. Abd: Soft, NT, ND, normal BS.  Ext: Warm, no edema, normal joints Skin: No atypical appearing moles. No rashes

## 2015-12-03 ENCOUNTER — Telehealth: Payer: Self-pay | Admitting: Dietician

## 2015-12-03 NOTE — Telephone Encounter (Signed)
Steroid-Induced Hyperglycemia Prevention and Management  A/P Current Regimen   currently on day 2 of therapy. Patient taking prednisone in the AM  Home BG Monitoring  Patient got the meter and check BG at home.  CBG at home was 284 today at 330 PM. Has not taken it any other times last night or today.   Medication Management  Additional treatment for BG control is not indicated at this time. ( needs two or mor CBGs >200 mg/dl)   Patient Education  Advised patient to monitor BG while on steroid therapy (at least twice daily prior to first 2 meals of the day).  Patient educated about signs/symptoms and advised to contact clinic if hyper- or hypoglycemic.  Patient did  verbalize understanding of information and regimen by repeating back topics discussed.  Follow-up tomorrow by phone  suggested is 2 weeks  Jadore Mcguffin, [name] 4:52 PM 12/03/2015 284 at 330 today, took prednisone last night at 12.

## 2015-12-03 NOTE — Telephone Encounter (Signed)
Ok. Thanks for the update. I warned her about the insomnia and hyperglycemia. Sounds like no psychosis now. Can end the course early if she finds these effects intolerable. Should take prednisone early in the morning.

## 2015-12-03 NOTE — Telephone Encounter (Signed)
Tried to call, lm for rtc 

## 2015-12-03 NOTE — Telephone Encounter (Signed)
Pt calls today 10/31 and states she cannot sleep since taking the prednisone and her blood sugar is 284. She states she is very restless and just cannot close her eyes. Please advise

## 2015-12-04 ENCOUNTER — Telehealth: Payer: Self-pay | Admitting: Dietician

## 2015-12-04 DIAGNOSIS — J449 Chronic obstructive pulmonary disease, unspecified: Secondary | ICD-10-CM | POA: Diagnosis not present

## 2015-12-04 NOTE — Telephone Encounter (Signed)
Spoke w/ pt she states she rested well last night, feels much better

## 2015-12-04 NOTE — Telephone Encounter (Signed)
Steroid-Induced Hyperglycemia Prevention and Management- Day 3 of therapy  A/P: "Doing good; sugar was 113 this morning". Cannot get blood with her new lancing device and is using her daughters. Requested that she check another blood sugar today. She verbalized understanding.  No medicine adjustment needed at this time.  Follow up by phone tomorrow.  Suggest office visit in 2 weeks [place], [name], RD

## 2015-12-05 ENCOUNTER — Telehealth: Payer: Self-pay | Admitting: Dietician

## 2015-12-05 NOTE — Telephone Encounter (Signed)
Calling to follow up on blood sugars: Day 4 of steroids. . Has not checked her blood sugar yet today- is waiting for her daughter to come from work to help her.   530 pm last night her sugar was  100s. Feeling better A/P  No further follow up needed. May come to office tomorrow for help in learning how to use her lancing device.

## 2015-12-09 ENCOUNTER — Ambulatory Visit (INDEPENDENT_AMBULATORY_CARE_PROVIDER_SITE_OTHER): Payer: Medicare Other | Admitting: Dietician

## 2015-12-09 ENCOUNTER — Encounter: Payer: Self-pay | Admitting: Dietician

## 2015-12-09 DIAGNOSIS — E119 Type 2 diabetes mellitus without complications: Secondary | ICD-10-CM | POA: Diagnosis not present

## 2015-12-09 DIAGNOSIS — Z713 Dietary counseling and surveillance: Secondary | ICD-10-CM

## 2015-12-09 MED ORDER — ACCU-CHEK GUIDE W/DEVICE KIT: 1.0000 | PACK | 0 refills | Status: DC | PRN

## 2015-12-09 MED ORDER — GLUCOSE BLOOD VI STRP: ORAL_STRIP | 0 refills | Status: DC

## 2015-12-09 MED ORDER — ACCU-CHEK FASTCLIX LANCETS MISC: 0 refills | Status: DC

## 2015-12-09 NOTE — Progress Notes (Signed)
Diabetes Self-Management Education  Visit Type: (P) First/Initial  Appt. Start Time: 1508 Appt. End Time: [id number]  12/09/2015  [name], identified by name and date of birth, is a 80 y.o. female with a diagnosis of Diabetes:  .   ASSESSMENT  Patient and daughters could not use the one touch verio that she  Got from the pharmacy. She is here today with her oldest daughter who is also our patient- Caitlyn Aguirre. Her daughters both have accu chek meters and she wanted the same one they have. [name] also needed a accu chek fastclick lancing device as hers was broken and she had not checked her sugar sicne Friday. They both checked their blood sugar today and they were both in the low 100s.  I was able to provide an Accu chek guide meter for her today with 10 strips and 12 lancets at her insistance. I corrected meter brand on her medication profile;suggest prescription for supplies as needed.         Diabetes Self-Management Education - 12/09/15 1600      Visit Information   Visit Type (P)  First/Initial      Individualized Plan for Diabetes Self-Management Training:   Learning Objective:  Patient will have a greater understanding of diabetes self-management. Patient education plan is to attend individual and/or group sessions per assessed needs and concerns  My plan to support myself in continuing these changes to care for my diabetes is to attend or contact:  Daughters, -doctor's office, CDE, Microbiologist, pharmacist, church  Plan:   There are no Patient Instructions on file for this visit.  Expected Outcomes:     Education material provided: A1C conversion sheet  If problems or questions, patient to contact team via:  Phone  Future DSME appointment:

## 2015-12-09 NOTE — Patient Instructions (Signed)
You were provded with a sample Accu chek guide meter today.    Please talk to your doctor about how often he wants to to check your blood sugar.   Please call when you have any questions about diabetes or nutrition.  Happy Holidays!  Caitlyn Aguirre [phone]

## 2015-12-11 ENCOUNTER — Other Ambulatory Visit: Payer: Self-pay | Admitting: Internal Medicine

## 2015-12-11 ENCOUNTER — Other Ambulatory Visit: Payer: Self-pay | Admitting: Student in an Organized Health Care Education/Training Program

## 2015-12-11 DIAGNOSIS — I10 Essential (primary) hypertension: Secondary | ICD-10-CM

## 2015-12-12 ENCOUNTER — Encounter: Payer: Self-pay | Admitting: Student in an Organized Health Care Education/Training Program

## 2016-01-03 DIAGNOSIS — J449 Chronic obstructive pulmonary disease, unspecified: Secondary | ICD-10-CM | POA: Diagnosis not present

## 2016-01-11 ENCOUNTER — Emergency Department (HOSPITAL_COMMUNITY)
Admission: EM | Admit: 2016-01-11 | Discharge: 2016-01-12 | Disposition: A | Discharge status: Home / Self Care | Payer: Medicare Other | Source: Home / Self Care | Attending: Emergency Medicine | Admitting: Emergency Medicine

## 2016-01-11 DIAGNOSIS — Z7982 Long term (current) use of aspirin: Secondary | ICD-10-CM | POA: Insufficient documentation

## 2016-01-11 DIAGNOSIS — J449 Chronic obstructive pulmonary disease, unspecified: Secondary | ICD-10-CM

## 2016-01-11 DIAGNOSIS — E039 Hypothyroidism, unspecified: Secondary | ICD-10-CM | POA: Insufficient documentation

## 2016-01-11 DIAGNOSIS — R1012 Left upper quadrant pain: Secondary | ICD-10-CM | POA: Diagnosis not present

## 2016-01-11 DIAGNOSIS — N179 Acute kidney failure, unspecified: Secondary | ICD-10-CM | POA: Diagnosis not present

## 2016-01-11 DIAGNOSIS — K8066 Calculus of gallbladder and bile duct with acute and chronic cholecystitis without obstruction: Secondary | ICD-10-CM | POA: Diagnosis not present

## 2016-01-11 DIAGNOSIS — Z87891 Personal history of nicotine dependence: Secondary | ICD-10-CM | POA: Insufficient documentation

## 2016-01-11 DIAGNOSIS — K219 Gastro-esophageal reflux disease without esophagitis: Secondary | ICD-10-CM | POA: Diagnosis not present

## 2016-01-11 DIAGNOSIS — E1165 Type 2 diabetes mellitus with hyperglycemia: Secondary | ICD-10-CM | POA: Diagnosis not present

## 2016-01-11 DIAGNOSIS — Z9981 Dependence on supplemental oxygen: Secondary | ICD-10-CM | POA: Diagnosis not present

## 2016-01-11 DIAGNOSIS — R1013 Epigastric pain: Secondary | ICD-10-CM | POA: Insufficient documentation

## 2016-01-11 DIAGNOSIS — J9611 Chronic respiratory failure with hypoxia: Secondary | ICD-10-CM | POA: Diagnosis not present

## 2016-01-11 DIAGNOSIS — I2510 Atherosclerotic heart disease of native coronary artery without angina pectoris: Secondary | ICD-10-CM | POA: Insufficient documentation

## 2016-01-11 DIAGNOSIS — Z955 Presence of coronary angioplasty implant and graft: Secondary | ICD-10-CM

## 2016-01-11 DIAGNOSIS — Z7984 Long term (current) use of oral hypoglycemic drugs: Secondary | ICD-10-CM | POA: Insufficient documentation

## 2016-01-11 DIAGNOSIS — Z8673 Personal history of transient ischemic attack (TIA), and cerebral infarction without residual deficits: Secondary | ICD-10-CM

## 2016-01-11 DIAGNOSIS — Z79899 Other long term (current) drug therapy: Secondary | ICD-10-CM | POA: Diagnosis not present

## 2016-01-11 DIAGNOSIS — I252 Old myocardial infarction: Secondary | ICD-10-CM | POA: Diagnosis not present

## 2016-01-11 DIAGNOSIS — I5032 Chronic diastolic (congestive) heart failure: Secondary | ICD-10-CM | POA: Insufficient documentation

## 2016-01-11 DIAGNOSIS — R072 Precordial pain: Secondary | ICD-10-CM

## 2016-01-11 DIAGNOSIS — I429 Cardiomyopathy, unspecified: Secondary | ICD-10-CM | POA: Diagnosis not present

## 2016-01-11 DIAGNOSIS — M353 Polymyalgia rheumatica: Secondary | ICD-10-CM | POA: Diagnosis not present

## 2016-01-11 DIAGNOSIS — I110 Hypertensive heart disease with heart failure: Secondary | ICD-10-CM | POA: Insufficient documentation

## 2016-01-11 DIAGNOSIS — K810 Acute cholecystitis: Secondary | ICD-10-CM | POA: Diagnosis not present

## 2016-01-11 DIAGNOSIS — M1990 Unspecified osteoarthritis, unspecified site: Secondary | ICD-10-CM | POA: Diagnosis not present

## 2016-01-11 DIAGNOSIS — K8000 Calculus of gallbladder with acute cholecystitis without obstruction: Secondary | ICD-10-CM | POA: Diagnosis not present

## 2016-01-11 DIAGNOSIS — K5730 Diverticulosis of large intestine without perforation or abscess without bleeding: Secondary | ICD-10-CM | POA: Diagnosis not present

## 2016-01-11 DIAGNOSIS — G4733 Obstructive sleep apnea (adult) (pediatric): Secondary | ICD-10-CM | POA: Diagnosis not present

## 2016-01-11 DIAGNOSIS — R079 Chest pain, unspecified: Secondary | ICD-10-CM | POA: Diagnosis not present

## 2016-01-11 NOTE — ED Triage Notes (Signed)
Per EMS pt was getting ready for bed approximately 2 hours ago and experienced chest pain, nausea, and vomiting.  Vomited x 1. Pt has a stated history of MI in the past.  Given IV zofran and sublingual nitro x 2 by EMS.  Pt states she is "better" but can't give her pain a number at this time.  Pt ambulated to bathroom.

## 2016-01-12 ENCOUNTER — Emergency Department (HOSPITAL_COMMUNITY): Payer: Medicare Other

## 2016-01-12 ENCOUNTER — Encounter (HOSPITAL_COMMUNITY): Payer: Self-pay | Admitting: Emergency Medicine

## 2016-01-12 LAB — BASIC METABOLIC PANEL
Anion gap: 12 (ref 5–15)
BUN: 9 mg/dL (ref 6–20)
CO2: 23 mmol/L (ref 22–32)
Calcium: 10.1 mg/dL (ref 8.9–10.3)
Chloride: 103 mmol/L (ref 101–111)
Creatinine, Ser: 0.8 mg/dL (ref 0.44–1.00)
GFR calc Af Amer: >60 mL/min (ref >60)
GFR calc non Af Amer: >60 mL/min (ref >60)
Glucose, Bld: 224 mg/dL — ABNORMAL HIGH (ref 65–99)
Potassium: 3.5 mmol/L (ref 3.5–5.1)
Sodium: 138 mmol/L (ref 135–145)

## 2016-01-12 LAB — DIFFERENTIAL
Basophils Absolute: 0 10*3/uL (ref 0.0–0.1)
Basophils Relative: 0 %
Eosinophils Absolute: 0.1 10*3/uL (ref 0.0–0.7)
Eosinophils Relative: 1 %
Lymphocytes Relative: 9 %
Lymphs Abs: 1.1 10*3/uL (ref 0.7–4.0)
Monocytes Absolute: 0.4 10*3/uL (ref 0.1–1.0)
Monocytes Relative: 3 %
Neutro Abs: 10.2 10*3/uL — ABNORMAL HIGH (ref 1.7–7.7)
Neutrophils Relative %: 87 %

## 2016-01-12 LAB — CBC
HCT: 39 % (ref 36.0–46.0)
Hemoglobin: 13.2 g/dL (ref 12.0–15.0)
MCH: 29 pg (ref 26.0–34.0)
MCHC: 33.8 g/dL (ref 30.0–36.0)
MCV: 85.7 fL (ref 78.0–100.0)
Platelets: 190 10*3/uL (ref 150–400)
RBC: 4.55 MIL/uL (ref 3.87–5.11)
RDW: 14 % (ref 11.5–15.5)
WBC: 11.8 10*3/uL — ABNORMAL HIGH (ref 4.0–10.5)

## 2016-01-12 LAB — HEPATIC FUNCTION PANEL
ALT: 13 U/L — ABNORMAL LOW (ref 14–54)
AST: 18 U/L (ref 15–41)
Albumin: 4.2 g/dL (ref 3.5–5.0)
Alkaline Phosphatase: 109 U/L (ref 38–126)
Bilirubin, Direct: 0.2 mg/dL (ref 0.1–0.5)
Indirect Bilirubin: 0.8 mg/dL (ref 0.3–0.9)
Total Bilirubin: 1 mg/dL (ref 0.3–1.2)
Total Protein: 7.6 g/dL (ref 6.5–8.1)

## 2016-01-12 LAB — I-STAT TROPONIN, ED: Troponin i, poc: 0 ng/mL (ref 0.00–0.08)

## 2016-01-12 LAB — LIPASE, BLOOD: Lipase: 15 U/L (ref 11–51)

## 2016-01-12 MED ORDER — ONDANSETRON HCL 4 MG PO TABS: 4.0000 mg | ORAL_TABLET | Freq: Four times a day (QID) | ORAL | 0 refills | Status: DC | PRN

## 2016-01-12 MED ORDER — GI COCKTAIL ~~LOC~~
30.0000 mL | Freq: Once | ORAL | Status: AC
  Administered 2016-01-12: 30 mL via ORAL
  Filled 2016-01-12: qty 30

## 2016-01-12 MED ORDER — MORPHINE SULFATE (PF) 4 MG/ML IV SOLN
4.0000 mg | Freq: Once | INTRAVENOUS | Status: AC
  Administered 2016-01-12: 4 mg via INTRAVENOUS
  Filled 2016-01-12: qty 1

## 2016-01-12 MED ORDER — PANTOPRAZOLE SODIUM 40 MG PO TBEC
40.0000 mg | DELAYED_RELEASE_TABLET | Freq: Once | ORAL | Status: AC
  Administered 2016-01-12: 40 mg via ORAL
  Filled 2016-01-12: qty 1

## 2016-01-12 MED ORDER — ONDANSETRON HCL 4 MG/2ML IJ SOLN
4.0000 mg | Freq: Once | INTRAMUSCULAR | Status: AC
  Administered 2016-01-12: 4 mg via INTRAVENOUS
  Filled 2016-01-12: qty 2

## 2016-01-12 NOTE — ED Notes (Signed)
Patient transported to X-ray 

## 2016-01-12 NOTE — ED Notes (Signed)
Pt's O2 saturations were in the 80s.  Placed pt on 2L via [place].

## 2016-01-12 NOTE — ED Notes (Signed)
IV team at bedside 

## 2016-01-12 NOTE — Discharge Instructions (Signed)
Increase your omeprazole (Prilosec) to twice a day.  Stop taking meloxicam (Mobic) - it may be irritating your stomach.  Return if symptoms are getting worse.

## 2016-01-12 NOTE — ED Notes (Signed)
This RN attempted 2IVs and was unable to get access

## 2016-01-12 NOTE — ED Notes (Signed)
Pt states she has 10/10 pain that radiates from her mid epigastric region up to her upper chest in the center.
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
## 2016-01-13 ENCOUNTER — Encounter (INDEPENDENT_AMBULATORY_CARE_PROVIDER_SITE_OTHER): Payer: Self-pay

## 2016-01-13 ENCOUNTER — Telehealth: Payer: Self-pay | Admitting: *Deleted

## 2016-01-13 ENCOUNTER — Encounter (HOSPITAL_COMMUNITY): Payer: Self-pay | Admitting: General Practice

## 2016-01-13 ENCOUNTER — Inpatient Hospital Stay (HOSPITAL_COMMUNITY)
Admission: AD | Admit: 2016-01-13 | Discharge: 2016-01-18 | DRG: 418 | Disposition: A | Discharge status: Home Health | Payer: Medicare Other | Source: Ambulatory Visit | Attending: Internal Medicine | Admitting: Internal Medicine

## 2016-01-13 ENCOUNTER — Ambulatory Visit (HOSPITAL_COMMUNITY)
Admission: RE | Admit: 2016-01-13 | Discharge: 2016-01-13 | Disposition: A | Discharge status: Home / Self Care | Payer: Medicare Other | Source: Ambulatory Visit | Attending: Student in an Organized Health Care Education/Training Program | Admitting: Student in an Organized Health Care Education/Training Program

## 2016-01-13 ENCOUNTER — Ambulatory Visit (INDEPENDENT_AMBULATORY_CARE_PROVIDER_SITE_OTHER): Payer: Medicare Other | Admitting: Student in an Organized Health Care Education/Training Program

## 2016-01-13 ENCOUNTER — Ambulatory Visit: Payer: Medicare Other

## 2016-01-13 ENCOUNTER — Encounter: Payer: Self-pay | Admitting: Student in an Organized Health Care Education/Training Program

## 2016-01-13 DIAGNOSIS — I5032 Chronic diastolic (congestive) heart failure: Secondary | ICD-10-CM | POA: Diagnosis present

## 2016-01-13 DIAGNOSIS — K219 Gastro-esophageal reflux disease without esophagitis: Secondary | ICD-10-CM | POA: Diagnosis present

## 2016-01-13 DIAGNOSIS — K810 Acute cholecystitis: Secondary | ICD-10-CM | POA: Diagnosis present

## 2016-01-13 DIAGNOSIS — Z79899 Other long term (current) drug therapy: Secondary | ICD-10-CM | POA: Diagnosis not present

## 2016-01-13 DIAGNOSIS — M1990 Unspecified osteoarthritis, unspecified site: Secondary | ICD-10-CM | POA: Diagnosis present

## 2016-01-13 DIAGNOSIS — K828 Other specified diseases of gallbladder: Secondary | ICD-10-CM | POA: Diagnosis not present

## 2016-01-13 DIAGNOSIS — K8046 Calculus of bile duct with acute and chronic cholecystitis without obstruction: Secondary | ICD-10-CM | POA: Diagnosis not present

## 2016-01-13 DIAGNOSIS — I252 Old myocardial infarction: Secondary | ICD-10-CM

## 2016-01-13 DIAGNOSIS — K8066 Calculus of gallbladder and bile duct with acute and chronic cholecystitis without obstruction: Secondary | ICD-10-CM | POA: Diagnosis not present

## 2016-01-13 DIAGNOSIS — E1169 Type 2 diabetes mellitus with other specified complication: Secondary | ICD-10-CM

## 2016-01-13 DIAGNOSIS — IMO0001 Reserved for inherently not codable concepts without codable children: Secondary | ICD-10-CM

## 2016-01-13 DIAGNOSIS — K8000 Calculus of gallbladder with acute cholecystitis without obstruction: Secondary | ICD-10-CM | POA: Diagnosis not present

## 2016-01-13 DIAGNOSIS — N179 Acute kidney failure, unspecified: Secondary | ICD-10-CM | POA: Diagnosis present

## 2016-01-13 DIAGNOSIS — J449 Chronic obstructive pulmonary disease, unspecified: Secondary | ICD-10-CM | POA: Diagnosis not present

## 2016-01-13 DIAGNOSIS — I110 Hypertensive heart disease with heart failure: Secondary | ICD-10-CM | POA: Diagnosis not present

## 2016-01-13 DIAGNOSIS — Z8673 Personal history of transient ischemic attack (TIA), and cerebral infarction without residual deficits: Secondary | ICD-10-CM

## 2016-01-13 DIAGNOSIS — K8050 Calculus of bile duct without cholangitis or cholecystitis without obstruction: Secondary | ICD-10-CM

## 2016-01-13 DIAGNOSIS — K5730 Diverticulosis of large intestine without perforation or abscess without bleeding: Secondary | ICD-10-CM | POA: Diagnosis present

## 2016-01-13 DIAGNOSIS — I429 Cardiomyopathy, unspecified: Secondary | ICD-10-CM | POA: Diagnosis present

## 2016-01-13 DIAGNOSIS — Z9981 Dependence on supplemental oxygen: Secondary | ICD-10-CM | POA: Diagnosis not present

## 2016-01-13 DIAGNOSIS — E039 Hypothyroidism, unspecified: Secondary | ICD-10-CM | POA: Diagnosis present

## 2016-01-13 DIAGNOSIS — Z01810 Encounter for preprocedural cardiovascular examination: Secondary | ICD-10-CM

## 2016-01-13 DIAGNOSIS — I2510 Atherosclerotic heart disease of native coronary artery without angina pectoris: Secondary | ICD-10-CM | POA: Diagnosis not present

## 2016-01-13 DIAGNOSIS — F329 Major depressive disorder, single episode, unspecified: Secondary | ICD-10-CM | POA: Diagnosis present

## 2016-01-13 DIAGNOSIS — K811 Chronic cholecystitis: Secondary | ICD-10-CM | POA: Diagnosis not present

## 2016-01-13 DIAGNOSIS — K819 Cholecystitis, unspecified: Secondary | ICD-10-CM | POA: Diagnosis not present

## 2016-01-13 DIAGNOSIS — K8001 Calculus of gallbladder with acute cholecystitis with obstruction: Secondary | ICD-10-CM

## 2016-01-13 DIAGNOSIS — G4733 Obstructive sleep apnea (adult) (pediatric): Secondary | ICD-10-CM | POA: Diagnosis not present

## 2016-01-13 DIAGNOSIS — R630 Anorexia: Secondary | ICD-10-CM | POA: Diagnosis present

## 2016-01-13 DIAGNOSIS — Z7982 Long term (current) use of aspirin: Secondary | ICD-10-CM

## 2016-01-13 DIAGNOSIS — K9189 Other postprocedural complications and disorders of digestive system: Secondary | ICD-10-CM

## 2016-01-13 DIAGNOSIS — M353 Polymyalgia rheumatica: Secondary | ICD-10-CM | POA: Diagnosis present

## 2016-01-13 DIAGNOSIS — R1011 Right upper quadrant pain: Secondary | ICD-10-CM

## 2016-01-13 DIAGNOSIS — Z0389 Encounter for observation for other suspected diseases and conditions ruled out: Secondary | ICD-10-CM | POA: Diagnosis not present

## 2016-01-13 DIAGNOSIS — Z419 Encounter for procedure for purposes other than remedying health state, unspecified: Secondary | ICD-10-CM

## 2016-01-13 DIAGNOSIS — K8042 Calculus of bile duct with acute cholecystitis without obstruction: Secondary | ICD-10-CM | POA: Diagnosis not present

## 2016-01-13 DIAGNOSIS — E1165 Type 2 diabetes mellitus with hyperglycemia: Secondary | ICD-10-CM | POA: Diagnosis not present

## 2016-01-13 DIAGNOSIS — Z9049 Acquired absence of other specified parts of digestive tract: Secondary | ICD-10-CM | POA: Diagnosis not present

## 2016-01-13 DIAGNOSIS — I509 Heart failure, unspecified: Secondary | ICD-10-CM | POA: Diagnosis not present

## 2016-01-13 DIAGNOSIS — K8020 Calculus of gallbladder without cholecystitis without obstruction: Secondary | ICD-10-CM

## 2016-01-13 DIAGNOSIS — I10 Essential (primary) hypertension: Secondary | ICD-10-CM | POA: Diagnosis present

## 2016-01-13 DIAGNOSIS — E119 Type 2 diabetes mellitus without complications: Secondary | ICD-10-CM | POA: Diagnosis not present

## 2016-01-13 DIAGNOSIS — Z7984 Long term (current) use of oral hypoglycemic drugs: Secondary | ICD-10-CM

## 2016-01-13 DIAGNOSIS — K567 Ileus, unspecified: Secondary | ICD-10-CM

## 2016-01-13 DIAGNOSIS — J9611 Chronic respiratory failure with hypoxia: Secondary | ICD-10-CM | POA: Diagnosis not present

## 2016-01-13 DIAGNOSIS — K833 Fistula of bile duct: Secondary | ICD-10-CM | POA: Diagnosis not present

## 2016-01-13 DIAGNOSIS — K812 Acute cholecystitis with chronic cholecystitis: Secondary | ICD-10-CM | POA: Diagnosis not present

## 2016-01-13 DIAGNOSIS — K8040 Calculus of bile duct with cholecystitis, unspecified, without obstruction: Secondary | ICD-10-CM

## 2016-01-13 HISTORY — DX: Personal history of urinary calculi: Z87.442

## 2016-01-13 LAB — CBC WITH DIFFERENTIAL/PLATELET
Basophils Absolute: 0 10*3/uL (ref 0.0–0.1)
Basophils Relative: 0 %
Eosinophils Absolute: 0 10*3/uL (ref 0.0–0.7)
Eosinophils Relative: 0 %
HCT: 36.8 % (ref 36.0–46.0)
Hemoglobin: 12.7 g/dL (ref 12.0–15.0)
Lymphocytes Relative: 6 %
Lymphs Abs: 1.2 10*3/uL (ref 0.7–4.0)
MCH: 29.4 pg (ref 26.0–34.0)
MCHC: 34.5 g/dL (ref 30.0–36.0)
MCV: 85.2 fL (ref 78.0–100.0)
Monocytes Absolute: 1.5 10*3/uL — ABNORMAL HIGH (ref 0.1–1.0)
Monocytes Relative: 7 %
Neutro Abs: 17.2 10*3/uL — ABNORMAL HIGH (ref 1.7–7.7)
Neutrophils Relative %: 87 %
Platelets: 204 10*3/uL (ref 150–400)
RBC: 4.32 MIL/uL (ref 3.87–5.11)
RDW: 14.2 % (ref 11.5–15.5)
WBC: 19.8 10*3/uL — ABNORMAL HIGH (ref 4.0–10.5)

## 2016-01-13 LAB — COMPREHENSIVE METABOLIC PANEL
ALT: 11 U/L — ABNORMAL LOW (ref 14–54)
AST: 21 U/L (ref 15–41)
Albumin: 3.5 g/dL (ref 3.5–5.0)
Alkaline Phosphatase: 89 U/L (ref 38–126)
Anion gap: 10 (ref 5–15)
BUN: 14 mg/dL (ref 6–20)
CO2: 25 mmol/L (ref 22–32)
Calcium: 9.9 mg/dL (ref 8.9–10.3)
Chloride: 97 mmol/L — ABNORMAL LOW (ref 101–111)
Creatinine, Ser: 1.09 mg/dL — ABNORMAL HIGH (ref 0.44–1.00)
GFR calc Af Amer: 55 mL/min — ABNORMAL LOW (ref >60)
GFR calc non Af Amer: 47 mL/min — ABNORMAL LOW (ref >60)
Glucose, Bld: 293 mg/dL — ABNORMAL HIGH (ref 65–99)
Potassium: 3.7 mmol/L (ref 3.5–5.1)
Sodium: 132 mmol/L — ABNORMAL LOW (ref 135–145)
Total Bilirubin: 1.6 mg/dL — ABNORMAL HIGH (ref 0.3–1.2)
Total Protein: 7 g/dL (ref 6.5–8.1)

## 2016-01-13 LAB — GLUCOSE, CAPILLARY
Glucose-Capillary: 205 mg/dL — ABNORMAL HIGH (ref 65–99)
Glucose-Capillary: 195 mg/dL — ABNORMAL HIGH (ref 65–99)

## 2016-01-13 LAB — BRAIN NATRIURETIC PEPTIDE: B Natriuretic Peptide: 41 pg/mL (ref 0.0–100.0)

## 2016-01-13 MED ORDER — ALBUTEROL SULFATE (2.5 MG/3ML) 0.083% IN NEBU: 2.5000 mg | INHALATION_SOLUTION | Freq: Four times a day (QID) | RESPIRATORY_TRACT | Status: DC | PRN

## 2016-01-13 MED ORDER — MOMETASONE FURO-FORMOTEROL FUM 200-5 MCG/ACT IN AERO
2.0000 | INHALATION_SPRAY | Freq: Two times a day (BID) | RESPIRATORY_TRACT | Status: DC
  Administered 2016-01-13 – 2016-01-17 (×5): 2 via RESPIRATORY_TRACT
  Filled 2016-01-13 (×2): qty 8.8

## 2016-01-13 MED ORDER — TIOTROPIUM BROMIDE MONOHYDRATE 18 MCG IN CAPS
18.0000 ug | ORAL_CAPSULE | Freq: Every day | RESPIRATORY_TRACT | Status: DC
  Administered 2016-01-14 – 2016-01-15 (×2): 18 ug via RESPIRATORY_TRACT
  Filled 2016-01-13 (×3): qty 5

## 2016-01-13 MED ORDER — ASPIRIN EC 81 MG PO TBEC
81.0000 mg | DELAYED_RELEASE_TABLET | Freq: Every day | ORAL | Status: DC
  Administered 2016-01-13 – 2016-01-18 (×6): 81 mg via ORAL
  Filled 2016-01-13 (×6): qty 1

## 2016-01-13 MED ORDER — PAROXETINE HCL 30 MG PO TABS
30.0000 mg | ORAL_TABLET | Freq: Every day | ORAL | Status: DC
  Administered 2016-01-13 – 2016-01-18 (×6): 30 mg via ORAL
  Filled 2016-01-13 (×6): qty 1

## 2016-01-13 MED ORDER — ONDANSETRON HCL 4 MG/2ML IJ SOLN
4.0000 mg | Freq: Four times a day (QID) | INTRAMUSCULAR | Status: DC | PRN
  Administered 2016-01-13 – 2016-01-14 (×2): 4 mg via INTRAVENOUS
  Filled 2016-01-13 (×2): qty 2

## 2016-01-13 MED ORDER — ENOXAPARIN SODIUM 40 MG/0.4ML ~~LOC~~ SOLN
40.0000 mg | SUBCUTANEOUS | Status: DC
  Administered 2016-01-14 – 2016-01-15 (×2): 40 mg via SUBCUTANEOUS
  Filled 2016-01-13 (×2): qty 0.4

## 2016-01-13 MED ORDER — CARVEDILOL 25 MG PO TABS
25.0000 mg | ORAL_TABLET | Freq: Two times a day (BID) | ORAL | Status: DC
  Administered 2016-01-13 – 2016-01-18 (×9): 25 mg via ORAL
  Filled 2016-01-13 (×10): qty 1

## 2016-01-13 MED ORDER — DEXTROSE 5 % IV SOLN
2.0000 g | INTRAVENOUS | Status: DC
  Administered 2016-01-13: 2 g via INTRAVENOUS
  Filled 2016-01-13 (×2): qty 2

## 2016-01-13 MED ORDER — INSULIN ASPART 100 UNIT/ML ~~LOC~~ SOLN
0.0000 [IU] | SUBCUTANEOUS | Status: DC
  Administered 2016-01-13: 3 [IU] via SUBCUTANEOUS
  Administered 2016-01-13 – 2016-01-14 (×2): 2 [IU] via SUBCUTANEOUS
  Administered 2016-01-14: 1 [IU] via SUBCUTANEOUS
  Administered 2016-01-14: 2 [IU] via SUBCUTANEOUS
  Administered 2016-01-14: 1 [IU] via SUBCUTANEOUS
  Administered 2016-01-15: 3 [IU] via SUBCUTANEOUS
  Administered 2016-01-16: 1 [IU] via SUBCUTANEOUS
  Administered 2016-01-16: 2 [IU] via SUBCUTANEOUS
  Administered 2016-01-16: 1 [IU] via SUBCUTANEOUS
  Administered 2016-01-16 (×4): 2 [IU] via SUBCUTANEOUS
  Administered 2016-01-17 (×2): 1 [IU] via SUBCUTANEOUS
  Administered 2016-01-17: 3 [IU] via SUBCUTANEOUS
  Administered 2016-01-17: 1 [IU] via SUBCUTANEOUS

## 2016-01-13 MED ORDER — LISINOPRIL 20 MG PO TABS: 20.0000 mg | ORAL_TABLET | Freq: Every day | ORAL | Status: DC

## 2016-01-13 MED ORDER — HYDROMORPHONE HCL 2 MG/ML IJ SOLN
0.5000 mg | INTRAMUSCULAR | Status: DC | PRN
  Administered 2016-01-13 – 2016-01-16 (×6): 1 mg via INTRAVENOUS
  Administered 2016-01-16 (×2): 0.5 mg via INTRAVENOUS
  Administered 2016-01-16 – 2016-01-17 (×2): 1 mg via INTRAVENOUS
  Filled 2016-01-13 (×10): qty 1

## 2016-01-13 MED ORDER — AMLODIPINE BESYLATE 5 MG PO TABS
5.0000 mg | ORAL_TABLET | Freq: Every day | ORAL | Status: DC
  Administered 2016-01-13 – 2016-01-18 (×6): 5 mg via ORAL
  Filled 2016-01-13 (×6): qty 1

## 2016-01-13 MED ORDER — PANTOPRAZOLE SODIUM 40 MG PO TBEC
40.0000 mg | DELAYED_RELEASE_TABLET | Freq: Every day | ORAL | Status: DC
Start: 2016-01-13 — End: 2016-01-18
  Administered 2016-01-13 – 2016-01-18 (×6): 40 mg via ORAL
  Filled 2016-01-13 (×6): qty 1

## 2016-01-13 MED ORDER — SODIUM CHLORIDE 0.9 % IV SOLN
INTRAVENOUS | Status: AC
  Administered 2016-01-13 – 2016-01-14 (×2): via INTRAVENOUS

## 2016-01-13 MED ORDER — ONDANSETRON HCL 4 MG PO TABS: 4.0000 mg | ORAL_TABLET | Freq: Four times a day (QID) | ORAL | Status: DC | PRN

## 2016-01-13 MED ORDER — TIOTROPIUM BROMIDE MONOHYDRATE 18 MCG IN CAPS
18.0000 ug | ORAL_CAPSULE | Freq: Every day | RESPIRATORY_TRACT | Status: DC
Start: 2016-01-13 — End: 2016-01-13
  Filled 2016-01-13: qty 5

## 2016-01-13 MED ORDER — LEVOTHYROXINE SODIUM 88 MCG PO TABS
88.0000 ug | ORAL_TABLET | Freq: Every day | ORAL | Status: DC
  Administered 2016-01-15 – 2016-01-18 (×4): 88 ug via ORAL
  Filled 2016-01-13 (×5): qty 1

## 2016-01-13 MED ORDER — LEVOTHYROXINE SODIUM 100 MCG IV SOLR: 44.0000 ug | Freq: Every day | INTRAVENOUS | Status: DC
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
## 2016-01-13 NOTE — Progress Notes (Signed)
Assessment and Plan:  See Encounters tab for problem-based medical decision making.    I spent 40 minutes with the patient with over 50% of the encounter time dedicated to counseling on the plan and treatment of cholecystitis.    __________________________________________________________  HPI:  80 year old woman comes in today for acute visit because of abdominal pain. This started on Saturday in her epigastrium and lower chest. The pain radiates down around her right upper abdomen to her right flank and back. She is evaluated in the emergency department on Saturday for this pain. They focused on evaluating the chest pain and found her to be low risk for cardiac ischemia. She was discharged with increased dose of PPI for presumed dyspepsia. She reports that on Sunday her symptoms progressed. She has been throwing up, unable to tolerate oral food or liquids. She reports fevers and chills at home. Last bowel movement was Saturday. Urinating lasts, denies dysuria, urinary urgency, or frequency. In the past she's had only 4 abdominal surgeries, all C-sections. Never had biliary disease before. Denies hematemesis or hematochezia. No significant NSAID use recently. She is accompanied today by her daughter who is very worried about the patient.  __________________________________________________________  Problem List: Patient Active Problem List   Diagnosis Date Noted  . High Risk for Falls 07/18/2015    Priority: High  . CAD (coronary artery disease) 09/17/2014    Priority: High  . Chronic diastolic heart failure ([place]) 01/21/2006    Priority: High  . COPD ([place]) 01/21/2006    Priority: High  . Diabetes mellitus ([place]) 10/04/1998    Priority: High  . Obstructive sleep apnea 12/25/2011    Priority: Medium  . Hypothyroidism 01/21/2006    Priority: Medium  . Depression 01/21/2006    Priority: Medium  . Essential hypertension 01/21/2006    Priority: Medium  . Osteoarthritis 01/21/2006   Priority: Medium  . GERD (gastroesophageal reflux disease) 06/16/2015    Priority: Low  . Urinary, incontinence, stress female 05/09/2015    Priority: Low  . Insomnia 05/09/2015    Priority: Low  . Eczema of hand 06/21/2013    Priority: Low  . Preventive measure 02/17/2013    Priority: Low  . Acute cholecystitis 01/13/2016    Medications: Reconciled today in Epic __________________________________________________________  Physical Exam:  Vital Signs: Vitals:   01/13/16 1124  BP: 132/74  Pulse: 97  Temp: 99.9 F (37.7 C)  TempSrc: Oral  Weight: 207 lb 12.8 oz (94.3 kg)  Height: 5\' 8"  (1.727 m)    Gen: Ill appearing woman ENT: Dry mucus membranes CV: tachycardic and irregular, no murmurs Pulm: Normal effort, CTA throughout, no wheezing Abd: Soft, Moderate right upper quadrant tenderness without guarding or rebound. Normal active bowel sounds. Ext: Warm, no edema, normal joints Skin: No atypical appearing moles. No rashes
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
## 2016-01-13 NOTE — Patient Instructions (Signed)
ADMIT TO INPATIENT

## 2016-01-13 NOTE — Progress Notes (Signed)
Arrived to 6n18. Daughter at bedside, teaching service notified of arrival

## 2016-01-13 NOTE — Telephone Encounter (Signed)
Pt's daughter calls and states pt in ED sat night via EMS- chest pain, diag acid reflux. Pt is crying w/ discomfort, only wants to see dr Evette Doffing, spoke w/ him, pt placed in empty [id number] slot
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
## 2016-01-13 NOTE — Assessment & Plan Note (Addendum)
Moderate to severe RUQ abdominal pain that is progressing over the last few days. Associated with fever at home, temp in clinic 37.7C. Exam consistent with gall bladder disease. She has normal bowel sounds, no pain in the lower quadrants. Labs show leukocytosis with left shift, elevated bili. RUQ ultrasound shows distended gall bladder with stone obstruction. All consistent with acute cholecystitis. Plan to admit the patient from clinic. Will need empiric antibiotics, IV fluids, and surgery consultation. I have contacted the on call team to discuss the case, and we have called in the bed.

## 2016-01-14 ENCOUNTER — Inpatient Hospital Stay (HOSPITAL_COMMUNITY): Payer: Medicare Other

## 2016-01-14 DIAGNOSIS — I5032 Chronic diastolic (congestive) heart failure: Secondary | ICD-10-CM

## 2016-01-14 DIAGNOSIS — Z833 Family history of diabetes mellitus: Secondary | ICD-10-CM

## 2016-01-14 DIAGNOSIS — Z7951 Long term (current) use of inhaled steroids: Secondary | ICD-10-CM

## 2016-01-14 DIAGNOSIS — Z9981 Dependence on supplemental oxygen: Secondary | ICD-10-CM

## 2016-01-14 DIAGNOSIS — N179 Acute kidney failure, unspecified: Secondary | ICD-10-CM

## 2016-01-14 DIAGNOSIS — E119 Type 2 diabetes mellitus without complications: Secondary | ICD-10-CM

## 2016-01-14 DIAGNOSIS — K219 Gastro-esophageal reflux disease without esophagitis: Secondary | ICD-10-CM

## 2016-01-14 DIAGNOSIS — F329 Major depressive disorder, single episode, unspecified: Secondary | ICD-10-CM

## 2016-01-14 DIAGNOSIS — Z794 Long term (current) use of insulin: Secondary | ICD-10-CM

## 2016-01-14 DIAGNOSIS — K810 Acute cholecystitis: Secondary | ICD-10-CM | POA: Diagnosis present

## 2016-01-14 DIAGNOSIS — I110 Hypertensive heart disease with heart failure: Secondary | ICD-10-CM

## 2016-01-14 DIAGNOSIS — G4733 Obstructive sleep apnea (adult) (pediatric): Secondary | ICD-10-CM

## 2016-01-14 DIAGNOSIS — Z8249 Family history of ischemic heart disease and other diseases of the circulatory system: Secondary | ICD-10-CM

## 2016-01-14 DIAGNOSIS — J449 Chronic obstructive pulmonary disease, unspecified: Secondary | ICD-10-CM

## 2016-01-14 DIAGNOSIS — Z79899 Other long term (current) drug therapy: Secondary | ICD-10-CM

## 2016-01-14 LAB — COMPREHENSIVE METABOLIC PANEL
ALT: 109 U/L — ABNORMAL HIGH (ref 14–54)
AST: 131 U/L — ABNORMAL HIGH (ref 15–41)
Albumin: 2.9 g/dL — ABNORMAL LOW (ref 3.5–5.0)
Alkaline Phosphatase: 119 U/L (ref 38–126)
Anion gap: 9 (ref 5–15)
BUN: 18 mg/dL (ref 6–20)
CO2: 25 mmol/L (ref 22–32)
Calcium: 9.4 mg/dL (ref 8.9–10.3)
Chloride: 105 mmol/L (ref 101–111)
Creatinine, Ser: 1.06 mg/dL — ABNORMAL HIGH (ref 0.44–1.00)
GFR calc Af Amer: 57 mL/min — ABNORMAL LOW (ref >60)
GFR calc non Af Amer: 49 mL/min — ABNORMAL LOW (ref >60)
Glucose, Bld: 144 mg/dL — ABNORMAL HIGH (ref 65–99)
Potassium: 3.7 mmol/L (ref 3.5–5.1)
Sodium: 139 mmol/L (ref 135–145)
Total Bilirubin: 1.4 mg/dL — ABNORMAL HIGH (ref 0.3–1.2)
Total Protein: 6 g/dL — ABNORMAL LOW (ref 6.5–8.1)

## 2016-01-14 LAB — CBC
HCT: 34 % — ABNORMAL LOW (ref 36.0–46.0)
Hemoglobin: 11.3 g/dL — ABNORMAL LOW (ref 12.0–15.0)
MCH: 28.5 pg (ref 26.0–34.0)
MCHC: 33.2 g/dL (ref 30.0–36.0)
MCV: 85.9 fL (ref 78.0–100.0)
Platelets: 147 10*3/uL — ABNORMAL LOW (ref 150–400)
RBC: 3.96 MIL/uL (ref 3.87–5.11)
RDW: 14.2 % (ref 11.5–15.5)
WBC: 8.8 10*3/uL (ref 4.0–10.5)

## 2016-01-14 LAB — URINALYSIS, ROUTINE W REFLEX MICROSCOPIC
Bilirubin Urine: NEGATIVE
Glucose, UA: NEGATIVE mg/dL
Hgb urine dipstick: NEGATIVE
Ketones, ur: NEGATIVE mg/dL
Leukocytes, UA: NEGATIVE
Nitrite: NEGATIVE
Protein, ur: 30 mg/dL — AB
Specific Gravity, Urine: 1.015 (ref 1.005–1.030)
pH: 5 (ref 5.0–8.0)

## 2016-01-14 LAB — GLUCOSE, CAPILLARY
Glucose-Capillary: 128 mg/dL — ABNORMAL HIGH (ref 65–99)
Glucose-Capillary: 138 mg/dL — ABNORMAL HIGH (ref 65–99)
Glucose-Capillary: 173 mg/dL — ABNORMAL HIGH (ref 65–99)
Glucose-Capillary: 119 mg/dL — ABNORMAL HIGH (ref 65–99)

## 2016-01-14 LAB — SURGICAL PCR SCREEN
MRSA, PCR: NEGATIVE
Staphylococcus aureus: NEGATIVE

## 2016-01-14 MED ORDER — SODIUM CHLORIDE 0.9 % IV SOLN
INTRAVENOUS | Status: AC
  Administered 2016-01-14: 12:00:00 via INTRAVENOUS

## 2016-01-14 MED ORDER — MORPHINE SULFATE (PF) 4 MG/ML IV SOLN
INTRAVENOUS | Status: AC
  Filled 2016-01-14: qty 1

## 2016-01-14 MED ORDER — TECHNETIUM TC 99M MEBROFENIN IV KIT
5.0000 | PACK | Freq: Once | INTRAVENOUS | Status: AC | PRN
  Administered 2016-01-14: 5 via INTRAVENOUS

## 2016-01-14 MED ORDER — PIPERACILLIN-TAZOBACTAM 3.375 G IVPB
3.3750 g | Freq: Three times a day (TID) | INTRAVENOUS | Status: DC
  Administered 2016-01-14 – 2016-01-18 (×12): 3.375 g via INTRAVENOUS
  Filled 2016-01-14 (×15): qty 50

## 2016-01-14 MED ORDER — MORPHINE SULFATE (PF) 4 MG/ML IV SOLN
3.0000 mg | Freq: Once | INTRAVENOUS | Status: AC
  Administered 2016-01-14: 3 mg via INTRAVENOUS

## 2016-01-14 NOTE — Progress Notes (Signed)
Q4 CBG obtained. Pt's CBG 138. Glucometer not registering patient's CBG when docked.

## 2016-01-14 NOTE — Progress Notes (Signed)
   Subjective: Some increased RUQ pain overnight. Well controlled this AM. No additional n/v. She is ready to procede with surgery. No CP or SOB.   Objective:  Vital signs in last 24 hours: Vitals:   01/14/16 0209 01/14/16 0455 01/14/16 0500 01/14/16 1018  BP: (!) 123/57 (!) 142/55  127/60  Pulse: 79 81  75  Resp: 16 18  19   Temp: 98.9 F (37.2 C) 97.5 F (36.4 C)  99.1 F (37.3 C)  TempSrc: Oral Oral  Oral  SpO2: 91% 95%  96%  Weight:   207 lb 4.8 oz (94 kg)    Physical Exam  Constitutional: She is oriented to person, place, and time. She appears well-developed and well-nourished.  HENT:  Head: Normocephalic and atraumatic.  Nasal cannula in place  Cardiovascular: Normal rate and regular rhythm.  Exam reveals no gallop and no friction rub.   No murmur heard. Respiratory: Effort normal and breath sounds normal. No respiratory distress.  No Wheezing   GI: Soft. Bowel sounds are normal. There is tenderness.  RUQ abdominal pain on palpation  Musculoskeletal: She exhibits no edema.  Neurological: She is alert and oriented to person, place, and time.     Assessment/Plan:  Principal Problem:   Acute cholecystitis Active Problems:   Diabetes mellitus ([place])   Essential hypertension   COPD ([place])   Normal coronary arteries  Caitlyn Aguirre is a 80 year old female with multiple comorbidities presenting with right upper quadrant abdominal pain, leukocytosis and ultrasound demonstrating pericholecystic fluid consistent with the diagnosis of acute cholecystitis.  1. Acute cholecystitis RUQ pain, leukocytosis, ultrasound with pericholecystic fluid, + Murphys sign  -- No need for pulmonology consult at this point. Patient's COPD has been stable on home medications. Home oxygen requirement of 2 L. No exacerbation this week. Patient is medically stable for surgery as we feel the benefits outweigh the risks in this patient presenting with acute cholecystitis with very well controlled COPD.  No Wheezing on examination this morning.  -- Surgery consult- will decide between cholecystectomy and percutaneous drainage  -- Cardiology- cleared pt for surgery with moderate risk 2/2 co-morbidities  -- Nothing by mouth -- NS @ 50cc/hr -- HIDA will f/u results -- Zosyn per pharmacy  -- Hydromorphone .5-1mg  q2 hrs prn pain   2. DM -- Insulin Aspart  3. Hypothyroidism  -- Levothyroxine   4. HTN and Hx of diastolic CHF -- Amlodipine 5mg  once daily -- Carvedilol 25mg  bid -- Hold lisinopril 2/2 Cr  5. COPD -- Dulera -- Spiriva -- Home 02 - 2 liters  -- Albuterol 2.5 q6 hours prn wheezing   6. AKI Cr of 1.09 on admission, at baseline around .80. Most likely 2/2 decreased PO intake associated with n/v. Will trend BMP and expect improvement with IV fluids. Will hold home lisinopril. Most recent Cr of 1.06. -- Hold lisinopril -- NS @ 50cc/hr  7. Depression -- Paroxetine 30mg    DVT/PE prophylaxis: Lovenox FEN/GI: NPO CODE: Full code   Dispo: Anticipated discharge pending intervention and recovery.   Caitlyn Shoulder, MD 01/14/2016, 11:26 AM Pager: [phone]

## 2016-01-14 NOTE — Progress Notes (Signed)
Patient Name: Caitlyn Aguirre Date of Encounter: 01/14/2016  Primary Cardiologist: [facility] Problem List     Principal Problem:   Acute cholecystitis Active Problems:   Diabetes mellitus ([place])   Essential hypertension   COPD ([place])   Normal coronary arteries     Subjective   Mild abd discomfort  Inpatient Medications    Scheduled Meds: . amLODipine  5 mg Oral Daily  . aspirin EC  81 mg Oral Daily  . carvedilol  25 mg Oral BID WC  . cefTRIAXone (ROCEPHIN)  IV  2 g Intravenous Q24H  . enoxaparin (LOVENOX) injection  40 mg Subcutaneous Q24H  . insulin aspart  0-9 Units Subcutaneous Q4H  . levothyroxine  88 mcg Oral QAC breakfast  . mometasone-formoterol  2 puff Inhalation BID  . pantoprazole  40 mg Oral Daily  . PARoxetine  30 mg Oral Daily  . tiotropium  18 mcg Inhalation Daily   Continuous Infusions:  PRN Meds: albuterol, HYDROmorphone (DILAUDID) injection, ondansetron (ZOFRAN) IV   Vital Signs    Vitals:   01/13/16 2054 01/14/16 0209 01/14/16 0455 01/14/16 0500  BP:  (!) 123/57 (!) 142/55   Pulse:  79 81   Resp:  16 18   Temp:  98.9 F (37.2 C) 97.5 F (36.4 C)   TempSrc:  Oral Oral   SpO2: 90% 91% 95%   Weight:    207 lb 4.8 oz (94 kg)    Intake/Output Summary (Last 24 hours) at 01/14/16 0909 Last data filed at 01/13/16 1700  Gross per 24 hour  Intake                0 ml  Output                0 ml  Net                0 ml   Filed Weights   01/14/16 0500  Weight: 207 lb 4.8 oz (94 kg)    Physical Exam    GEN: Well nourished, well developed, elderly in no acute distress.  HEENT: Grossly normal.  Neck: Supple, no JVD, carotid bruits, or masses. Cardiac: RRR, no murmurs, rubs, or gallops. No clubbing, cyanosis, edema.  Radials/DP/PT 2+ and equal bilaterally.  Respiratory:  Respirations regular and unlabored, clear to auscultation bilaterally. GI: Soft, mildly tender, nondistended, BS + x 4. MS: no deformity or atrophy. Skin: warm  and dry, no rash. Neuro:  Strength and sensation are intact. Psych: AAOx3.  Normal affect.  Labs    CBC  Recent Labs  01/12/16 0003 01/13/16 1139 01/14/16 0553  WBC 11.8* 19.8* 8.8  NEUTROABS 10.2* 17.2*  --   HGB 13.2 12.7 11.3*  HCT 39.0 36.8 34.0*  MCV 85.7 85.2 85.9  PLT 190 204 [id number]*   Basic Metabolic Panel  Recent Labs  01/13/16 1139 01/14/16 0553  NA 132* 139  K 3.7 3.7  CL 97* 105  CO2 25 25  GLUCOSE 293* 144*  BUN 14 18  CREATININE 1.09* 1.06*  CALCIUM 9.9 9.4   Liver Function Tests  Recent Labs  01/13/16 1139 01/14/16 0553  AST 21 131*  ALT 11* 109*  ALKPHOS 89 119  BILITOT 1.6* 1.4*  PROT 7.0 6.0*  ALBUMIN 3.5 2.9*    Recent Labs  01/12/16 0003  LIPASE 15     Telemetry    None  ECG    NSR no acute changes - Personally Reviewed  Radiology  US Abdomen Limited Ruq  Result Date: 01/13/2016 CLINICAL DATA:  Right upper quadrant pain for 4 days. History of diabetes and hypertension. EXAM: US ABDOMEN LIMITED - RIGHT UPPER QUADRANT COMPARISON:  None. FINDINGS: Gallbladder: Gallbladder is distended. There are multiple dependent stones, largest measuring 7 mm. There is a small amount of pericholecystic fluid/ edema. No sonographic Murphy's sign. Common bile duct: Diameter: 6.3 mm Liver: No focal lesion identified. Within normal limits in parenchymal echogenicity. IMPRESSION: 1. Distended gallbladder containing stones with a thickened wall and some pericholecystic fluid/edema. Findings support acute cholecystitis in the proper clinical setting. Electronically Signed   By: Lajean Manes M.D.   On: 01/13/2016 12:33    Cardiac Studies   NUC stress 10/31/15:  Nuclear stress EF: 55%.  There was no ST segment deviation noted during stress.  No T wave inversion was noted during stress.  Defect 1: There is a medium defect of mild severity present in the mid anterolateral and apical lateral location.  Findings consistent with prior myocardial  infarction.  This is a low risk study.   Low risk stress nuclear study with a medium sized area of mild anterolateral scar, no reversible ischemia and normal left ventricular regional and global systolic function.  Patient Profile     63 year with old MI in 1999 but normal coronary arteries on Cath in 2010, with NUC stress 2016 low risk normal EF here with cholecystitis.   Assessment & Plan    Cholecystitis  - per primary team.  - WBC improving with IV abx  - surgical team consult reviewed.   - If cholecystectomy is needed, she may proceed with mild to moderate risk (Age mainly) from a cardiac perspective. EF is normal. Recent low risk NUC stress test. Discussed with family.  Pre op cardiac risk stratification  - as above  Old MI  - in 1999. Cath normal 2010. Nuc low risk. Old anterolateral scar on NUC. Secondary prevention.   Will sign off. Please call if any questions.   Signed, Candee Furbish, MD  01/14/2016, 9:09 AM

## 2016-01-14 NOTE — Progress Notes (Signed)
Pharmacy Antibiotic Note  Caitlyn Aguirre is a 80 y.o. female admitted on 01/13/2016 with RUQ abdominal pain.  Pharmacy has been consulted for Zosyn dosing for intra-abdominal infection.  Patient's renal function is relatively stable.  She is afebrile and her WBC has normalized.   Plan: - Zosyn 3.375gm IV Q8H, 4 hr infusion - Pharmacy will sign off since dosing adjustment is likely unnecessary.  Thank you for the consult!  Weight: 207 lb 4.8 oz (94 kg)  Temp (24hrs), Avg:98.7 F (37.1 C), Min:97.5 F (36.4 C), Max:99.2 F (37.3 C)   Recent Labs Lab 01/12/16 0003 01/13/16 1139 01/14/16 0553  WBC 11.8* 19.8* 8.8  CREATININE 0.80 1.09* 1.06*    Estimated Creatinine Clearance: 52.4 mL/min (by C-G formula based on SCr of 1.06 mg/dL (H)).    No Known Allergies    Onur Mori D. Mina Marble, PharmD, BCPS Pager:  [phone] 01/14/2016, 12:31 PM

## 2016-01-14 NOTE — Progress Notes (Signed)
Q4 CBG obtained. Pt's CBG 173. Glucometer didn't register patient's CBG when docked.

## 2016-01-14 NOTE — Progress Notes (Signed)
Subjective: She feels better but is still tender RUQ.  O2 is currently off but she says she uses it allot.  Objective: Vital signs in last 24 hours: Temp:  [97.5 F (36.4 C)-99.9 F (37.7 C)] 97.5 F (36.4 C) (12/12 0455) Pulse Rate:  [79-97] 81 (12/12 0455) Resp:  [16-18] 18 (12/12 0455) BP: (123-142)/(55-74) 142/55 (12/12 0455) SpO2:  [86 %-95 %] 93 % (12/12 0740) Weight:  [94 kg (207 lb 4.8 oz)-94.3 kg (207 lb 12.8 oz)] 94 kg (207 lb 4.8 oz) (12/12 0500)   Nothing on I/O Afebrile, VSS AST/ALT are up some, Bilirubin is better, WBC is normal today HIDA pending   Intake/Output from previous day: No intake/output data recorded. Intake/Output this shift: No intake/output data recorded.  General appearance: alert, cooperative and no distress GI: Tender RUQ  Lab Results:   Recent Labs  01/13/16 1139 01/14/16 0553  WBC 19.8* 8.8  HGB 12.7 11.3*  HCT 36.8 34.0*  PLT 204 147*    BMET  Recent Labs  01/13/16 1139 01/14/16 0553  NA 132* 139  K 3.7 3.7  CL 97* 105  CO2 25 25  GLUCOSE 293* 144*  BUN 14 18  CREATININE 1.09* 1.06*  CALCIUM 9.9 9.4   PT/INR No results for input(s): LABPROT, INR in the last 72 hours.   Recent Labs Lab 01/12/16 0003 01/13/16 1139 01/14/16 0553  AST 18 21 131*  ALT 13* 11* 109*  ALKPHOS 109 89 119  BILITOT 1.0 1.6* 1.4*  PROT 7.6 7.0 6.0*  ALBUMIN 4.2 3.5 2.9*     Lipase     Component Value Date/Time   LIPASE 15 01/12/2016 0003     Studies/Results: US Abdomen Limited Ruq  Result Date: 01/13/2016 CLINICAL DATA:  Right upper quadrant pain for 4 days. History of diabetes and hypertension. EXAM: US ABDOMEN LIMITED - RIGHT UPPER QUADRANT COMPARISON:  None. FINDINGS: Gallbladder: Gallbladder is distended. There are multiple dependent stones, largest measuring 7 mm. There is a small amount of pericholecystic fluid/ edema. No sonographic Murphy's sign. Common bile duct: Diameter: 6.3 mm Liver: No focal lesion identified.  Within normal limits in parenchymal echogenicity. IMPRESSION: 1. Distended gallbladder containing stones with a thickened wall and some pericholecystic fluid/edema. Findings support acute cholecystitis in the proper clinical setting. Electronically Signed   By: Lajean Manes M.D.   On: 01/13/2016 12:33   Prior to Admission medications   Medication Sig Start Date End Date Taking? Authorizing Provider  albuterol (PROVENTIL HFA;VENTOLIN HFA) 108 (90 Base) MCG/ACT inhaler Inhale 1-2 puffs into the lungs every 6 (six) hours as needed for wheezing or shortness of breath. 11/15/15  Yes Margette Fast, MD  amLODipine (NORVASC) 5 MG tablet Take 1 tablet (5 mg total) by mouth daily. 09/26/15  Yes Axel Filler, MD  aspirin EC 81 MG tablet Take 1 tablet (81 mg total) by mouth daily. 09/26/15  Yes Axel Filler, MD  atorvastatin (LIPITOR) 40 MG tablet Take 1 tablet by mouth every evening at [facility] Patient taking differently: Take 40 mg by mouth daily at 6 PM.  09/26/15  Yes Axel Filler, MD  carvedilol (COREG) 25 MG tablet Take 1 tablet (25 mg total) by mouth 2 (two) times daily with a meal. 09/26/15  Yes Axel Filler, MD  Fluticasone-Salmeterol (ADVAIR DISKUS) 250-50 MCG/DOSE AEPB Inhale 1 puff into the lungs 2 (two) times daily. 09/26/15  Yes Axel Filler, MD  levothyroxine (SYNTHROID, LEVOTHROID) 88 MCG tablet Take 1 tablet by  mouth every morning 30 minutes before other medications and food. Take with water only. Patient taking differently: Take 88 mcg by mouth daily before breakfast. Take 1 tablet by mouth every morning 30 minutes before other medications and food. Take with water only. 09/26/15  Yes Axel Filler, MD  lisinopril (PRINIVIL,ZESTRIL) 20 MG tablet Take 1 tablet (20 mg total) by mouth daily. 09/26/15  Yes Axel Filler, MD  metFORMIN (GLUCOPHAGE) 1000 MG tablet Take 1 tablet (1,000 mg total) by mouth 2 (two) times daily with a meal. 09/26/15  Yes  Axel Filler, MD  omeprazole (PRILOSEC) 20 MG capsule Take 1 capsule (20 mg total) by mouth daily with supper. 09/26/15  Yes Axel Filler, MD  ondansetron (ZOFRAN) 4 MG tablet Take 1 tablet (4 mg total) by mouth every 6 (six) hours as needed for nausea or vomiting. 43/83/81  Yes Delora Fuel, MD  OXYGEN Inhale into the lungs at bedtime as needed (for breathing).    Yes Historical Provider, MD  PARoxetine (PAXIL) 30 MG tablet Take 1 tablet (30 mg total) by mouth daily. 09/26/15  Yes Axel Filler, MD  tiotropium (SPIRIVA HANDIHALER) 18 MCG inhalation capsule INHALE THE CONTENTS OF 1 CAPSULE VIA HANDIHALER BY MOUTH EVERY DAY Patient taking differently: Place 18 mcg into inhaler and inhale daily. INHALE THE CONTENTS OF 1 CAPSULE VIA HANDIHALER BY MOUTH EVERY DAY 09/26/15  Yes Axel Filler, MD  ACCU-CHEK FASTCLIX LANCETS MISC Use as directed Patient not taking: Reported on 01/13/2016 12/09/15   Axel Filler, MD  Blood Glucose Monitoring Suppl ([place]) w/Device KIT 1 each by Does not apply route as needed. Patient not taking: Reported on 01/13/2016 12/09/15   Axel Filler, MD  glucose blood (ACCU-CHEK GUIDE) test strip Use as instructed Patient not taking: Reported on 01/13/2016 12/09/15   Axel Filler, MD  omeprazole (PRILOSEC) 20 MG capsule TAKE 1 CAPSULE BY MOUTH EVERY DAY Patient not taking: Reported on 01/13/2016 12/12/15   Axel Filler, MD    Medications: . amLODipine  5 mg Oral Daily  . aspirin EC  81 mg Oral Daily  . carvedilol  25 mg Oral BID WC  . cefTRIAXone (ROCEPHIN)  IV  2 g Intravenous Q24H  . enoxaparin (LOVENOX) injection  40 mg Subcutaneous Q24H  . insulin aspart  0-9 Units Subcutaneous Q4H  . levothyroxine  88 mcg Oral QAC breakfast  . mometasone-formoterol  2 puff Inhalation BID  . pantoprazole  40 mg Oral Daily  . PARoxetine  30 mg Oral Daily  . tiotropium  18 mcg Inhalation Daily   No IV  fluids  Assessment/Plan RUQ pain with possible Acute cholecystitis CHF COPD on Home O2 AODM Hypertension FEN: NPO ID:  Day 2 Rocephin DVT:  Lovenox    Plan:  HIDA pending    LOS: 1 day    Halla Chopp 01/14/2016 [phone]

## 2016-01-14 NOTE — Progress Notes (Signed)
Subjective: RUQ abdominal pain overnight. Had several BMs. Felt feverish intermittently. Denies chest pain or trouble breathing. NPO overnight. Cleared for surgery by cards.  Objective: Vital signs in last 24 hours: Temp:  [97.5 F (36.4 C)-99.2 F (37.3 C)] 99.1 F (37.3 C) (12/12 1018) Pulse Rate:  [75-90] 75 (12/12 1018) Resp:  [16-19] 19 (12/12 1018) BP: (123-142)/(55-62) 127/60 (12/12 1018) SpO2:  [86 %-96 %] 96 % (12/12 1018) Weight:  [94 kg (207 lb 4.8 oz)] 94 kg (207 lb 4.8 oz) (12/12 0500) Weight change:   Intake/Output Summary (Last 24 hours) at 01/14/16 1315 Last data filed at 01/13/16 1700  Gross per 24 hour  Intake                0 ml  Output                0 ml  Net                0 ml    Physical Exam: Gen: resting in bed, oxygen on HEENT: no scleral icterus, moist mucous membranes CV: regular rate and rhythm Pulm: bibasilar crackles, normal work of breathing Abd: RUQ tenderness, bowel sounds present Neuro: alert and oriented. Ext: warm, pulses 2+, no edema  Lab Results: Results for orders placed or performed during the hospital encounter of 01/13/16 (from the past 24 hour(s))  Glucose, capillary     Status: Abnormal   Collection Time: 01/13/16  6:09 PM  Result Value Ref Range   Glucose-Capillary 205 (H) 65 - 99 mg/dL  Brain natriuretic peptide     Status: None   Collection Time: 01/13/16  6:18 PM  Result Value Ref Range   B Natriuretic Peptide 41.0 0.0 - 100.0 pg/mL  Glucose, capillary     Status: Abnormal   Collection Time: 01/13/16  8:24 PM  Result Value Ref Range   Glucose-Capillary 195 (H) 65 - 99 mg/dL  Glucose, capillary     Status: Abnormal   Collection Time: 01/14/16 12:27 AM  Result Value Ref Range   Glucose-Capillary 173 (H) 65 - 99 mg/dL  Surgical pcr screen     Status: None   Collection Time: 01/14/16  4:44 AM  Result Value Ref Range   MRSA, PCR NEGATIVE NEGATIVE   Staphylococcus aureus NEGATIVE NEGATIVE  Glucose, capillary      Status: Abnormal   Collection Time: 01/14/16  4:54 AM  Result Value Ref Range   Glucose-Capillary 138 (H) 65 - 99 mg/dL  CBC     Status: Abnormal   Collection Time: 01/14/16  5:53 AM  Result Value Ref Range   WBC 8.8 4.0 - 10.5 K/uL   RBC 3.96 3.87 - 5.11 MIL/uL   Hemoglobin 11.3 (L) 12.0 - 15.0 g/dL   HCT 34.0 (L) 36.0 - 46.0 %   MCV 85.9 78.0 - 100.0 fL   MCH 28.5 26.0 - 34.0 pg   MCHC 33.2 30.0 - 36.0 g/dL   RDW 14.2 11.5 - 15.5 %   Platelets 147 (L) 150 - 400 K/uL  Comprehensive metabolic panel     Status: Abnormal   Collection Time: 01/14/16  5:53 AM  Result Value Ref Range   Sodium 139 135 - 145 mmol/L   Potassium 3.7 3.5 - 5.1 mmol/L   Chloride 105 101 - 111 mmol/L   CO2 25 22 - 32 mmol/L   Glucose, Bld 144 (H) 65 - 99 mg/dL   BUN 18 6 - 20 mg/dL   Creatinine,  Ser 1.06 (H) 0.44 - 1.00 mg/dL   Calcium 9.4 8.9 - 10.3 mg/dL   Total Protein 6.0 (L) 6.5 - 8.1 g/dL   Albumin 2.9 (L) 3.5 - 5.0 g/dL   AST 131 (H) 15 - 41 U/L   ALT 109 (H) 14 - 54 U/L   Alkaline Phosphatase 119 38 - 126 U/L   Total Bilirubin 1.4 (H) 0.3 - 1.2 mg/dL   GFR calc non Af Amer 49 (L) >60 mL/min   GFR calc Af Amer 57 (L) >60 mL/min   Anion gap 9 5 - 15  Glucose, capillary     Status: Abnormal   Collection Time: 01/14/16  8:15 AM  Result Value Ref Range   Glucose-Capillary 128 (H) 65 - 99 mg/dL     Micro Results: Recent Results (from the past 240 hour(s))  Surgical pcr screen     Status: None   Collection Time: 01/14/16  4:44 AM  Result Value Ref Range Status   MRSA, PCR NEGATIVE NEGATIVE Final   Staphylococcus aureus NEGATIVE NEGATIVE Final    Comment:        The Xpert SA Assay (FDA approved for NASAL specimens in patients over 86 years of age), is one component of a comprehensive surveillance program.  Test performance has been validated by [facility] for patients greater than or equal to 92 year old. It is not intended to diagnose infection nor to guide or monitor treatment.       Studies/Results: No results found. Medications: I have reviewed the patient's current medications. Scheduled Meds: . amLODipine  5 mg Oral Daily  . aspirin EC  81 mg Oral Daily  . carvedilol  25 mg Oral BID WC  . enoxaparin (LOVENOX) injection  40 mg Subcutaneous Q24H  . insulin aspart  0-9 Units Subcutaneous Q4H  . levothyroxine  88 mcg Oral QAC breakfast  . mometasone-formoterol  2 puff Inhalation BID  . pantoprazole  40 mg Oral Daily  . PARoxetine  30 mg Oral Daily  . piperacillin-tazobactam (ZOSYN)  IV  3.375 g Intravenous Q8H  . tiotropium  18 mcg Inhalation Daily   Continuous Infusions: . sodium chloride 50 mL/hr at 01/14/16 1216   PRN Meds:.albuterol, HYDROmorphone (DILAUDID) injection, ondansetron (ZOFRAN) IV  Assessment/Plan: Principal Problem:   Acute cholecystitis Active Problems:   Diabetes mellitus ([place])   Essential hypertension   COPD ([place])   Normal coronary arteries  Caitlyn Aguirre is a 80 y.o. female with multiple comorbidities admitted for acute cholecystitis.    Acute cholecystitis: Patient presented with 5 day history of sharp RUQ pain, N/V, fevers, chills, and anorexia. Physical exam positive for RUQ tenderness and positive Murphy's sign. No rebound tenderness. WBC 19.8. Tbili 1.6. RUQ [place] Distended gallbladder containing stones (largest 3mm) with a thickened wall and some pericholecystic fluid/edema. This is consistent with acute cholecystitis. WBC 8.8 today - cholecystectomy vs. percutaneous drainage  - f/u HIDA scan - changed ceftriaxone to zosyn - Dilaudid 0.5-1mg  q2h mod-severe pain  CAD: - continue home ASA, carvedilol, atorvastatin  COPD: - continue home Spiriva, Dulera, albuterol, oxygen  T2DM: - hold home metformin - SSI-s  Hypertension: - hold home lisinopril due to mild AKI - continue home amlodipine  Hypothyroidism: - continue home synthroid  Depression: - continue home Paxil  FEN/GI: - NPO until surgery -  pantoprazole - zofran   DVT ppx: Lovenox 40 mg IV  This is a Careers information officer Note.  The care of the patient was discussed with  Dr. Heber Knapp and the assessment and plan formulated with his assistance.  Please see his attached note for official documentation of the daily encounter.   LOS: 1 day   Armen Pickup, Medical Student 01/14/2016, 1:15 PM

## 2016-01-14 NOTE — Progress Notes (Signed)
Initial Nutrition Assessment  DOCUMENTATION CODES:   Obesity unspecified  INTERVENTION:   -RD will follow for diet advancement and supplement as appropriate  NUTRITION DIAGNOSIS:   Inadequate oral intake related to altered GI function as evidenced by NPO status.  GOAL:   Patient will meet greater than or equal to 90% of their needs  MONITOR:   Diet advancement, Labs, Weight trends, Skin, I & O's  REASON FOR ASSESSMENT:   Malnutrition Screening Tool    ASSESSMENT:   Patient is a 80 year old female with a prior history of CAD, CHF, COPD on home oxygen, DM, HTN, hypothyroidism who was sent to [facility] via the internal [place] for abdominal pain. Patient states she's been experiencing intermittent, progressively worsening, sharp, severe abdominal pain since Thursday.   Pt admitted with acute cholecystitis. Per discussion with RN, plan for HIDA scan today.   Spoke with pt at bedside, who reports poor appetite over the past 3-4 days due to abdominal pain, after eating a sausage biscuit and pumpkin pie. Pt typically with good appetite and consumes a wide variety of foods.   Pt shares that she usually weighs around 215#. She thinks that she lost all of her weight within the past week, however, this is not consistent with wt hx. No weight changes significant for time frame.   Nutrition-Focused physical exam completed. Findings are no fat depletion, no muscle depletion, and no edema.   Pt understanding of NPO order. Discussed potential for diet advancement and importance of good PO intake to support healing.   Labs reviewed: CBGS: 128-173.   Diet Order:  Diet NPO time specified  Skin:  Reviewed, no issues  Last BM:  01/11/16  Height:   Ht Readings from Last 1 Encounters:  01/13/16 5\' 8"  (1.727 m)    Weight:   Wt Readings from Last 1 Encounters:  01/14/16 207 lb 4.8 oz (94 kg)    Ideal Body Weight:  63.6 kg  BMI:  Body mass index is 31.52  kg/m.  Estimated Nutritional Needs:   Kcal:  1700-1900  Protein:  85-100 grams  Fluid:  1.7-1.9 L  EDUCATION NEEDS:   Education needs addressed  Verba Ainley A. Jimmye Norman, RD, LDN, CDE Pager: [phone] After hours Pager: [phone]

## 2016-01-14 NOTE — Progress Notes (Signed)
Patient's Q4 CBG obtained. CBG is 153. Glucometer docked but not registering into computer/patient's chart.

## 2016-01-14 NOTE — Care Management Note (Signed)
Case Management Note  Patient Details  Name: ALLYSA HUNT MRN: [id number] Date of Birth: 08/21/1935  Subjective/Objective:                    Action/Plan: RUQ pain with possible Acute cholecystitis CHF COPD on Home O2 AODM  Will continue to follow  Expected Discharge Date:                  Expected Discharge Plan:  [place]  In-House Referral:     Discharge planning Services     Post Acute Care Choice:    Choice offered to:     DME Arranged:    DME Agency:     HH Arranged:    [place] Agency:     Status of Service:  In process, will continue to follow  If discussed at Long Length of Stay Meetings, dates discussed:    Additional Comments:  Marilu Favre, RN 01/14/2016, 10:20 AM

## 2016-01-15 ENCOUNTER — Inpatient Hospital Stay (HOSPITAL_COMMUNITY): Payer: Medicare Other

## 2016-01-15 ENCOUNTER — Inpatient Hospital Stay (HOSPITAL_COMMUNITY): Payer: Medicare Other | Admitting: Anesthesiology

## 2016-01-15 ENCOUNTER — Encounter (HOSPITAL_COMMUNITY): Payer: Self-pay | Admitting: Certified Registered Nurse Anesthetist

## 2016-01-15 ENCOUNTER — Encounter (HOSPITAL_COMMUNITY)
Admission: AD | Disposition: A | Discharge status: Home Health | Payer: Self-pay | Source: Ambulatory Visit | Attending: Internal Medicine

## 2016-01-15 DIAGNOSIS — K811 Chronic cholecystitis: Secondary | ICD-10-CM

## 2016-01-15 DIAGNOSIS — K8042 Calculus of bile duct with acute cholecystitis without obstruction: Secondary | ICD-10-CM

## 2016-01-15 DIAGNOSIS — J9611 Chronic respiratory failure with hypoxia: Secondary | ICD-10-CM

## 2016-01-15 DIAGNOSIS — K810 Acute cholecystitis: Secondary | ICD-10-CM

## 2016-01-15 HISTORY — PX: CHOLECYSTECTOMY: SHX55

## 2016-01-15 LAB — BASIC METABOLIC PANEL
Anion gap: 6 (ref 5–15)
BUN: 15 mg/dL (ref 6–20)
CO2: 28 mmol/L (ref 22–32)
Calcium: 9.2 mg/dL (ref 8.9–10.3)
Chloride: 107 mmol/L (ref 101–111)
Creatinine, Ser: 0.88 mg/dL (ref 0.44–1.00)
GFR calc Af Amer: >60 mL/min (ref >60)
GFR calc non Af Amer: >60 mL/min (ref >60)
Glucose, Bld: 107 mg/dL — ABNORMAL HIGH (ref 65–99)
Potassium: 3.6 mmol/L (ref 3.5–5.1)
Sodium: 141 mmol/L (ref 135–145)

## 2016-01-15 LAB — CBC
HCT: 31.3 % — ABNORMAL LOW (ref 36.0–46.0)
Hemoglobin: 10.5 g/dL — ABNORMAL LOW (ref 12.0–15.0)
MCH: 28.7 pg (ref 26.0–34.0)
MCHC: 33.5 g/dL (ref 30.0–36.0)
MCV: 85.5 fL (ref 78.0–100.0)
Platelets: 161 10*3/uL (ref 150–400)
RBC: 3.66 MIL/uL — ABNORMAL LOW (ref 3.87–5.11)
RDW: 14.2 % (ref 11.5–15.5)
WBC: 6.4 10*3/uL (ref 4.0–10.5)

## 2016-01-15 LAB — GLUCOSE, CAPILLARY
Glucose-Capillary: 105 mg/dL — ABNORMAL HIGH (ref 65–99)
Glucose-Capillary: 107 mg/dL — ABNORMAL HIGH (ref 65–99)
Glucose-Capillary: 99 mg/dL (ref 65–99)
Glucose-Capillary: 99 mg/dL (ref 65–99)
Glucose-Capillary: 149 mg/dL — ABNORMAL HIGH (ref 65–99)
Glucose-Capillary: 209 mg/dL — ABNORMAL HIGH (ref 65–99)

## 2016-01-15 SURGERY — LAPAROSCOPIC CHOLECYSTECTOMY WITH INTRAOPERATIVE CHOLANGIOGRAM
Anesthesia: General | Site: Abdomen

## 2016-01-15 MED ORDER — PROPOFOL 10 MG/ML IV BOLUS
INTRAVENOUS | Status: AC
Start: 2016-01-15 — End: 2016-01-15
  Filled 2016-01-15: qty 20

## 2016-01-15 MED ORDER — OXYCODONE HCL 5 MG PO TABS: 5.0000 mg | ORAL_TABLET | Freq: Once | ORAL | Status: DC | PRN

## 2016-01-15 MED ORDER — PIPERACILLIN-TAZOBACTAM 3.375 G IVPB 30 MIN
3.3750 g | INTRAVENOUS | Status: DC
  Filled 2016-01-15: qty 50

## 2016-01-15 MED ORDER — BUPIVACAINE-EPINEPHRINE 0.25% -1:200000 IJ SOLN
INTRAMUSCULAR | Status: DC | PRN
  Administered 2016-01-15: 17 mL

## 2016-01-15 MED ORDER — ARTIFICIAL TEARS OP OINT
TOPICAL_OINTMENT | OPHTHALMIC | Status: AC
  Filled 2016-01-15: qty 3.5

## 2016-01-15 MED ORDER — ONDANSETRON HCL 4 MG/2ML IJ SOLN
INTRAMUSCULAR | Status: AC
  Filled 2016-01-15: qty 2

## 2016-01-15 MED ORDER — BUPIVACAINE-EPINEPHRINE (PF) 0.25% -1:200000 IJ SOLN
INTRAMUSCULAR | Status: AC
  Filled 2016-01-15: qty 30

## 2016-01-15 MED ORDER — FENTANYL CITRATE (PF) 100 MCG/2ML IJ SOLN
INTRAMUSCULAR | Status: AC
  Filled 2016-01-15: qty 4

## 2016-01-15 MED ORDER — LIDOCAINE 2% (20 MG/ML) 5 ML SYRINGE
INTRAMUSCULAR | Status: AC
  Filled 2016-01-15: qty 5

## 2016-01-15 MED ORDER — PROPOFOL 10 MG/ML IV BOLUS
INTRAVENOUS | Status: DC | PRN
  Administered 2016-01-15: 60 mg via INTRAVENOUS

## 2016-01-15 MED ORDER — OXYCODONE HCL 5 MG/5ML PO SOLN: 5.0000 mg | Freq: Once | ORAL | Status: DC | PRN

## 2016-01-15 MED ORDER — LACTATED RINGERS IV SOLN
INTRAVENOUS | Status: DC
  Administered 2016-01-15 – 2016-01-17 (×4): via INTRAVENOUS

## 2016-01-15 MED ORDER — IOPAMIDOL (ISOVUE-300) INJECTION 61%
INTRAVENOUS | Status: AC
Start: 2016-01-15 — End: 2016-01-15
  Filled 2016-01-15: qty 50

## 2016-01-15 MED ORDER — DEXAMETHASONE SODIUM PHOSPHATE 10 MG/ML IJ SOLN
INTRAMUSCULAR | Status: DC | PRN
  Administered 2016-01-15: 5 mg via INTRAVENOUS

## 2016-01-15 MED ORDER — FENTANYL CITRATE (PF) 100 MCG/2ML IJ SOLN
INTRAMUSCULAR | Status: AC
Start: 2016-01-15 — End: 2016-01-16
  Filled 2016-01-15: qty 2

## 2016-01-15 MED ORDER — SODIUM CHLORIDE 0.9 % IR SOLN
Status: DC | PRN
  Administered 2016-01-15: 1000 mL

## 2016-01-15 MED ORDER — ROCURONIUM BROMIDE 50 MG/5ML IV SOSY
PREFILLED_SYRINGE | INTRAVENOUS | Status: AC
  Filled 2016-01-15: qty 15

## 2016-01-15 MED ORDER — FENTANYL CITRATE (PF) 100 MCG/2ML IJ SOLN
25.0000 ug | INTRAMUSCULAR | Status: DC | PRN
  Administered 2016-01-15: 25 ug via INTRAVENOUS

## 2016-01-15 MED ORDER — ROCURONIUM BROMIDE 100 MG/10ML IV SOLN
INTRAVENOUS | Status: DC | PRN
  Administered 2016-01-15 (×2): 5 mg via INTRAVENOUS
  Administered 2016-01-15: 50 mg via INTRAVENOUS
  Administered 2016-01-15: 5 mg via INTRAVENOUS

## 2016-01-15 MED ORDER — ONDANSETRON HCL 4 MG/2ML IJ SOLN
INTRAMUSCULAR | Status: DC | PRN
  Administered 2016-01-15: 4 mg via INTRAVENOUS

## 2016-01-15 MED ORDER — FENTANYL CITRATE (PF) 100 MCG/2ML IJ SOLN
INTRAMUSCULAR | Status: DC | PRN
Start: 2016-01-15 — End: 2016-01-15
  Administered 2016-01-15: 50 ug via INTRAVENOUS
  Administered 2016-01-15 (×4): 25 ug via INTRAVENOUS
  Administered 2016-01-15: 50 ug via INTRAVENOUS

## 2016-01-15 MED ORDER — SUGAMMADEX SODIUM 200 MG/2ML IV SOLN
INTRAVENOUS | Status: AC
  Filled 2016-01-15: qty 2

## 2016-01-15 MED ORDER — SODIUM CHLORIDE 0.9 % IV SOLN
INTRAVENOUS | Status: DC | PRN
  Administered 2016-01-15: 10 mL

## 2016-01-15 MED ORDER — SUGAMMADEX SODIUM 200 MG/2ML IV SOLN
INTRAVENOUS | Status: DC | PRN
  Administered 2016-01-15: 200 mg via INTRAVENOUS

## 2016-01-15 MED ORDER — 0.9 % SODIUM CHLORIDE (POUR BTL) OPTIME
TOPICAL | Status: DC | PRN
  Administered 2016-01-15: 1000 mL

## 2016-01-15 MED ORDER — LIDOCAINE HCL (CARDIAC) 20 MG/ML IV SOLN
INTRAVENOUS | Status: DC | PRN
  Administered 2016-01-15: 60 mg via INTRAVENOUS

## 2016-01-15 SURGICAL SUPPLY — 41 items
APPLIER CLIP 5 13 M/L LIGAMAX5 (MISCELLANEOUS) ×2
BLADE SURG ROTATE 9660 (MISCELLANEOUS) IMPLANT
CANISTER SUCTION 2500CC (MISCELLANEOUS) ×2 IMPLANT
CHLORAPREP W/TINT 26ML (MISCELLANEOUS) ×2 IMPLANT
CLIP APPLIE 5 13 M/L LIGAMAX5 (MISCELLANEOUS) ×1 IMPLANT
COVER MAYO STAND STRL (DRAPES) ×2 IMPLANT
COVER SURGICAL LIGHT HANDLE (MISCELLANEOUS) ×2 IMPLANT
DERMABOND ADVANCED (GAUZE/BANDAGES/DRESSINGS) ×1
DERMABOND ADVANCED .7 DNX12 (GAUZE/BANDAGES/DRESSINGS) ×1 IMPLANT
DRAPE C-ARM 42X72 X-RAY (DRAPES) ×2 IMPLANT
DRSG TEGADERM 2-3/8X2-3/4 SM (GAUZE/BANDAGES/DRESSINGS) ×2 IMPLANT
ELECT REM PT RETURN 9FT ADLT (ELECTROSURGICAL) ×2
ELECTRODE REM PT RTRN 9FT ADLT (ELECTROSURGICAL) ×1 IMPLANT
ENDOLOOP SUT PDS II  0 18 (SUTURE) ×1
ENDOLOOP SUT PDS II 0 18 (SUTURE) ×1 IMPLANT
GLOVE BIOGEL PI IND STRL 8 (GLOVE) ×2 IMPLANT
GLOVE BIOGEL PI INDICATOR 8 (GLOVE) ×2
GLOVE ECLIPSE 7.5 STRL STRAW (GLOVE) ×2 IMPLANT
GLOVE SURG SS PI 8.0 STRL IVOR (GLOVE) ×2 IMPLANT
GOWN STRL REUS W/ TWL LRG LVL3 (GOWN DISPOSABLE) ×2 IMPLANT
GOWN STRL REUS W/TWL LRG LVL3 (GOWN DISPOSABLE) ×2
KIT BASIN OR (CUSTOM PROCEDURE TRAY) ×2 IMPLANT
KIT ROOM TURNOVER OR (KITS) ×2 IMPLANT
NS IRRIG 1000ML POUR BTL (IV SOLUTION) ×2 IMPLANT
PAD ARMBOARD 7.5X6 YLW CONV (MISCELLANEOUS) ×2 IMPLANT
POUCH RETRIEVAL ECOSAC 10 (ENDOMECHANICALS) ×1 IMPLANT
POUCH RETRIEVAL ECOSAC 10MM (ENDOMECHANICALS) ×1
SCISSORS LAP 5X35 DISP (ENDOMECHANICALS) ×2 IMPLANT
SET CHOLANGIOGRAPH 5 50 .035 (SET/KITS/TRAYS/PACK) ×2 IMPLANT
SET IRRIG TUBING LAPAROSCOPIC (IRRIGATION / IRRIGATOR) ×2 IMPLANT
SLEEVE ENDOPATH XCEL 5M (ENDOMECHANICALS) ×4 IMPLANT
SPECIMEN JAR SMALL (MISCELLANEOUS) ×2 IMPLANT
STRIP CLOSURE SKIN 1/2X4 (GAUZE/BANDAGES/DRESSINGS) ×2 IMPLANT
SUT ETHILON 2 0 FS 18 (SUTURE) ×2 IMPLANT
SUT MNCRL AB 4-0 PS2 18 (SUTURE) ×2 IMPLANT
TOWEL OR 17X24 6PK STRL BLUE (TOWEL DISPOSABLE) ×2 IMPLANT
TOWEL OR 17X26 10 PK STRL BLUE (TOWEL DISPOSABLE) ×2 IMPLANT
TRAY LAPAROSCOPIC MC (CUSTOM PROCEDURE TRAY) ×2 IMPLANT
TROCAR XCEL BLUNT TIP 100MML (ENDOMECHANICALS) ×2 IMPLANT
TROCAR XCEL NON-BLD 5MMX100MML (ENDOMECHANICALS) ×2 IMPLANT
TUBING INSUFFLATION (TUBING) ×2 IMPLANT

## 2016-01-15 NOTE — Progress Notes (Signed)
Patient's Q4 CBG obtained. CBG is 99. Glucometer not registering CBG into patient's chart.

## 2016-01-15 NOTE — Progress Notes (Signed)
Patietn has chronic cholecystitis according to HIDA with a patent cystic duct but had to give morphine to visualize.  Maybe she passed a stone, but with her chronic problems would go ahead with surgery.  Not sure if this was the cause of her severe abdominal pain a few days ago with WBC 19K.  Kathryne Eriksson. Dahlia Bailiff, MD, [place] [phone] [facility] Surgery

## 2016-01-15 NOTE — Anesthesia Postprocedure Evaluation (Signed)
Anesthesia Post Note  Patient: Caitlyn Aguirre  Procedure(s) Performed: Procedure(s) (LRB): LAPAROSCOPIC CHOLECYSTECTOMY WITH INTRAOPERATIVE CHOLANGIOGRAM (N/A)  Patient location during evaluation: PACU Anesthesia Type: General Level of consciousness: awake and alert Pain management: pain level controlled Vital Signs Assessment: post-procedure vital signs reviewed and stable Respiratory status: spontaneous breathing, nonlabored ventilation, respiratory function stable and patient connected to nasal cannula oxygen Cardiovascular status: blood pressure returned to baseline and stable Postop Assessment: no signs of nausea or vomiting Anesthetic complications: no    Last Vitals:  Vitals:   01/15/16 1620 01/15/16 1638  BP: (!) 148/70 125/69  Pulse: 73 72  Resp: 18 18  Temp: 36.4 C 36.9 C    Last Pain:  Vitals:   01/15/16 1815  TempSrc:   PainSc: 7                  Caitlyn Aguirre,W. EDMOND

## 2016-01-15 NOTE — Progress Notes (Signed)
   Subjective: Did well overnight. RUQ pain improving. No n/v. No chest pain or shortness of breath. Overall, says she feels better. Objective:  Vital signs in last 24 hours: Vitals:   01/14/16 2034 01/15/16 0025 01/15/16 0456 01/15/16 0500  BP: (!) 102/57 111/69 121/64   Pulse: 73 66 65   Resp: 20 18 18    Temp: 98.5 F (36.9 C) 97.5 F (36.4 C) 98.4 F (36.9 C)   TempSrc: Oral Oral Oral   SpO2: 94% 95% 96%   Weight:    216 lb 14.4 oz (98.4 kg)   Physical Exam  Constitutional: She is oriented to person, place, and time. She appears well-developed and well-nourished.  HENT:  Head: Normocephalic and atraumatic.  Nasal cannula in place  Cardiovascular: Normal rate and regular rhythm.  Exam reveals no gallop and no friction rub.   No murmur heard. Respiratory: Effort normal and breath sounds normal. No respiratory distress.  No Wheezing   GI: Soft. Bowel sounds are normal. There is tenderness.  RUQ abdominal pain on palpation is improved from prior  Musculoskeletal: She exhibits no edema.  Neurological: She is alert and oriented to person, place, and time.     Assessment/Plan:  Principal Problem:   Acute cholecystitis Active Problems:   Diabetes mellitus ([place] Aguirre)   Essential hypertension   COPD ([place])   Normal coronary arteries   Acute cholecystitis  Caitlyn Aguirre is a 80 year old female with multiple comorbidities presenting with right upper quadrant abdominal pain, leukocytosis and ultrasound demonstrating pericholecystic fluid consistent with the diagnosis of acute cholecystitis.  1. Acute cholecystitis RUQ pain, leukocytosis, ultrasound with pericholecystic fluid, + Murphys sign  -- No need for pulmonology consult at this point. Patient's COPD has been stable on home medications. Home oxygen requirement of 2 L. No exacerbation this week. Patient is medically stable for surgery as we feel the benefits outweigh the risks in this patient presenting with acute cholecystitis  with very well controlled COPD. No Wheezing on examination this morning.  -- Surgery consult- will decide between cholecystectomy and percutaneous drainage  -- Cardiology- cleared pt for surgery with moderate risk 2/2 co-morbidities  -- Nothing by mouth -- NS @ 50cc/hr -- HIDA scan complete will f/u with read -- Zosyn per pharmacy  -- Hydromorphone .5-1mg  q2 hrs prn pain   2. DM -- Insulin Aspart  3. Hypothyroidism  -- Levothyroxine   4. HTN and Hx of diastolic CHF Currently normotensive  -- Amlodipine 5mg  once daily -- Carvedilol 25mg  bid -- Hold lisinopril 2/2 Cr  5. COPD -- Dulera -- Spiriva -- Home 02 - 2 liters  -- Albuterol 2.5 q6 hours prn wheezing   6. AKI Cr of 1.09 on admission, at baseline around .80. Most likely 2/2 decreased PO intake associated with n/v. Will trend BMP and expect improvement with IV fluids. Will hold home lisinopril.  -- Hold lisinopril -- NS @ 50cc/hr  7. Depression -- Paroxetine 30mg    DVT/PE prophylaxis: Lovenox FEN/GI: NPO since midnight  CODE: Full code   Dispo: Anticipated discharge pending intervention and recovery.   Caitlyn Shoulder, MD 01/15/2016, 7:41 AM Pager: [phone]

## 2016-01-15 NOTE — Progress Notes (Signed)
Patient's Q4 CBG obtained. CBG is 105. Glucometer not registering CBG into patient's chart.

## 2016-01-15 NOTE — Anesthesia Procedure Notes (Signed)
Procedure Name: Intubation Date/Time: 01/15/2016 1:44 PM Performed by: Candis Shine Pre-anesthesia Checklist: Patient identified, Emergency Drugs available, Suction available and Patient being monitored Patient Re-evaluated:Patient Re-evaluated prior to inductionOxygen Delivery Method: Circle System Utilized Preoxygenation: Pre-oxygenation with 100% oxygen Intubation Type: IV induction Ventilation: Mask ventilation without difficulty Laryngoscope Size: Mac and 3 Grade View: Grade I Tube type: Oral Tube size: 7.0 mm Number of attempts: 1 Airway Equipment and Method: Stylet and Oral airway Placement Confirmation: ETT inserted through vocal cords under direct vision,  positive ETCO2 and breath sounds checked- equal and bilateral Secured at: 22 cm Tube secured with: Tape Dental Injury: Teeth and Oropharynx as per pre-operative assessment

## 2016-01-15 NOTE — Anesthesia Preprocedure Evaluation (Addendum)
Anesthesia Evaluation  Patient identified by MRN, date of birth, ID band Patient awake    Reviewed: Allergy & Precautions, H&P , NPO status , Patient's Chart, lab work & pertinent test results, reviewed documented beta blocker date and time   Airway Mallampati: III  TM Distance: >3 FB Neck ROM: Full    Dental no notable dental hx. (+) Edentulous Upper, Edentulous Lower, Dental Advisory Given   Pulmonary COPD,  COPD inhaler, former smoker,    Pulmonary exam normal breath sounds clear to auscultation       Cardiovascular hypertension, Pt. on medications and Pt. on home beta blockers + Past MI and +CHF   Rhythm:Regular Rate:Normal     Neuro/Psych Depression CVA negative psych ROS   GI/Hepatic Neg liver ROS, GERD  Medicated and Controlled,  Endo/Other  diabetes, Insulin DependentHypothyroidism   Renal/GU negative Renal ROS  negative genitourinary   Musculoskeletal  (+) Arthritis , Osteoarthritis,    Abdominal   Peds  Hematology negative hematology ROS (+)   Anesthesia Other Findings   Reproductive/Obstetrics negative OB ROS                            Anesthesia Physical Anesthesia Plan  ASA: III  Anesthesia Plan: General   Post-op Pain Management:    Induction: Intravenous  Airway Management Planned: Oral ETT  Additional Equipment:   Intra-op Plan:   Post-operative Plan: Extubation in OR  Informed Consent: I have reviewed the patients History and Physical, chart, labs and discussed the procedure including the risks, benefits and alternatives for the proposed anesthesia with the patient or authorized representative who has indicated his/her understanding and acceptance.   Dental advisory given  Plan Discussed with: CRNA  Anesthesia Plan Comments:         Anesthesia Quick Evaluation

## 2016-01-15 NOTE — Op Note (Signed)
OPERATIVE REPORT  DATE OF OPERATION:  01/15/2016  PATIENT:  Caitlyn Aguirre  80 y.o. female  PRE-OPERATIVE DIAGNOSIS:  Chronic cholecystitis and cholelithiasis  POST-OPERATIVE DIAGNOSIS:  Chronic cholecystitis, cholelithiasis and acute cholecystitis  INDICATION(S) FOR OPERATION:  Severe RUQ pain and tenderness.  HIDA positive for chronic cholecystitis  FINDINGS:  Acute cholecystitis.  Distal CBD stone on IOC.  Large cystic duct  PROCEDURE:  Procedure(s): LAPAROSCOPIC CHOLECYSTECTOMY WITH INTRAOPERATIVE CHOLANGIOGRAM  SURGEON:  Surgeon(s): Judeth Horn, MD  ASSISTANT: None  ANESTHESIA:   general  COMPLICATIONS:  None  EBL: 50 ml  BLOOD ADMINISTERED: none  DRAINS: (19 Fr) Blake drain(s) in the hepatic fossa   SPECIMEN:  Source of Specimen:  Gallbladder and contents  COUNTS CORRECT:  YES  PROCEDURE DETAILS: The patient was taken to the operating room and placed on the table in the supine position.  After an adequate endotracheal anesthetic was administered, the patient was prepped with ChloroPrep, and then draped in the usual manner exposing the entire abdomen laterally, inferiorly and up  to the costal margins.  After a proper timeout was performed including identifying the patient and the procedure to be performed, a supraumbilical [id number] midline incision was made using a #15 blade.  This was taken down to the fascia which was then incised with a #15 blade.  The edges of the fascia were tented up with Kocher clamps as the preperitoneal space was penetrated with a Kelly clamp into the peritoneum.  Once this was done, a pursestring suture of 0 Vicryl was passed around the fascial opening.  This was subsequently used to secure the [facility] cannula which was passed into the peritoneal cavity.  Once the [facility] cannula was in place, carbon dioxide gas was insufflated into the peritoneal cavity up to a maximal intra-abdominal pressure of 61mm Hg.The laparoscope, with attached camera and  light source, was passed into the peritoneal cavity to visualize the direct insertion of two right upper quadrant 42mm cannulas, and a sup-xiphoid 23mm cannula.  Once all cannulas were in place, the dissection was begun.  Two ratcheted graspers were attached to the dome and infundibulum of the gallbladder and retracted towards the anterior abdominal wall and the right upper quadrant.  Using cautery attached to a dissecting forceps, the peritoneum overlaying the triangle of Chalot and the hepatoduodenal triangle was dissected away exposing the cystic duct and the cystic artery.  A critical window was developed between the CBD and the cystic duct The cystic artery was clipped proximally and distally then transected.  A clip was placed on the gallbladder side of the cystic duct, then a cholecystodochotomy made using the laparoscopic scissors.  Through the cholecystodochotomy a Cook catheter was passed to performed a cholangiogram.  The cholangiogram showed good proximal filling, flow into the duodenum, an intraductal filling defect in the distal CBD without occlusion, no dilatation..  Once the cholangiogram was completed, the [facility] catheter was removed, and the distal cystic duct was clipped multiple times and an Endoloop device was placed below the clips for additional closure because it was large in size, then transected between the clips.  The gallbladder was then dissected out of the hepatic bed without event.  It was retrieved from the abdomen (using an EndoCatch bag) without event.  Once the gallbladder was removed, the bed was inspected for hemostasis.  Once excellent hemostasis was obtained all gas and fluids were aspirated from above the liver, then the cannulas were removed.  The supraumbilical incision was closed using the  pursestring suture which was in place.  0.25% bupivicaine with epinephrine was injected at all sites.  All 62mm or greater cannula sites were close using a running subcuticular stitch  of 4-0 Monocryl.  5.59mm cannula sites were closed with Dermabond only.Steri-Strips and Tagaderm were used to complete the dressings at all sites.  A Blake drain was placed in the hepatic fossa being brought out of the abdomen at the lateral most trocar site.  This was a 19Fr. Fluted Blake drain.  It was secured in place using a 2-0 Nylon suture.  At this point all needle, sponge, and instrument counts were correct.The patient was awakened from anesthesia and taken to the PACU in stable condition.  Kathryne Eriksson. Dahlia Bailiff, MD, [place] [phone] [facility]  PATIENT DISPOSITION:  PACU - hemodynamically stable.   Uel Davidow 12/13/20173:43 PM
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
## 2016-01-15 NOTE — Transfer of Care (Signed)
Immediate Anesthesia Transfer of Care Note  Patient: Caitlyn Aguirre  Procedure(s) Performed: Procedure(s): LAPAROSCOPIC CHOLECYSTECTOMY WITH INTRAOPERATIVE CHOLANGIOGRAM (N/A)  Patient Location: PACU  Anesthesia Type:General  Level of Consciousness: awake and alert   Airway & Oxygen Therapy: Patient Spontanous Breathing and Patient connected to face mask oxygen  Post-op Assessment: Report given to RN and Post -op Vital signs reviewed and stable  Post vital signs: Reviewed and stable  Last Vitals:  Vitals:   01/15/16 0456 01/15/16 1143  BP: 121/64 130/64  Pulse: 65 69  Resp: 18 18  Temp: 36.9 C 36.9 C    Last Pain:  Vitals:   01/15/16 1143  TempSrc: Oral  PainSc:          Complications: No apparent anesthesia complications

## 2016-01-16 ENCOUNTER — Inpatient Hospital Stay (HOSPITAL_COMMUNITY): Payer: Medicare Other

## 2016-01-16 ENCOUNTER — Encounter (HOSPITAL_COMMUNITY): Payer: Self-pay | Admitting: General Surgery

## 2016-01-16 DIAGNOSIS — K8046 Calculus of bile duct with acute and chronic cholecystitis without obstruction: Secondary | ICD-10-CM

## 2016-01-16 DIAGNOSIS — K8050 Calculus of bile duct without cholangitis or cholecystitis without obstruction: Secondary | ICD-10-CM

## 2016-01-16 DIAGNOSIS — K8020 Calculus of gallbladder without cholecystitis without obstruction: Secondary | ICD-10-CM

## 2016-01-16 LAB — GLUCOSE, CAPILLARY
Glucose-Capillary: 105 mg/dL — ABNORMAL HIGH (ref 65–99)
Glucose-Capillary: 139 mg/dL — ABNORMAL HIGH (ref 65–99)
Glucose-Capillary: 149 mg/dL — ABNORMAL HIGH (ref 65–99)
Glucose-Capillary: 153 mg/dL — ABNORMAL HIGH (ref 65–99)
Glucose-Capillary: 158 mg/dL — ABNORMAL HIGH (ref 65–99)
Glucose-Capillary: 159 mg/dL — ABNORMAL HIGH (ref 65–99)
Glucose-Capillary: 183 mg/dL — ABNORMAL HIGH (ref 65–99)
Glucose-Capillary: 186 mg/dL — ABNORMAL HIGH (ref 65–99)
Glucose-Capillary: 187 mg/dL — ABNORMAL HIGH (ref 65–99)

## 2016-01-16 LAB — COMPREHENSIVE METABOLIC PANEL
ALT: 70 U/L — ABNORMAL HIGH (ref 14–54)
AST: 52 U/L — ABNORMAL HIGH (ref 15–41)
Albumin: 2.8 g/dL — ABNORMAL LOW (ref 3.5–5.0)
Alkaline Phosphatase: 135 U/L — ABNORMAL HIGH (ref 38–126)
Anion gap: 10 (ref 5–15)
BUN: 11 mg/dL (ref 6–20)
CO2: 23 mmol/L (ref 22–32)
Calcium: 9.4 mg/dL (ref 8.9–10.3)
Chloride: 108 mmol/L (ref 101–111)
Creatinine, Ser: 0.84 mg/dL (ref 0.44–1.00)
GFR calc Af Amer: >60 mL/min (ref >60)
GFR calc non Af Amer: >60 mL/min (ref >60)
Glucose, Bld: 131 mg/dL — ABNORMAL HIGH (ref 65–99)
Potassium: 4 mmol/L (ref 3.5–5.1)
Sodium: 141 mmol/L (ref 135–145)
Total Bilirubin: 1.1 mg/dL (ref 0.3–1.2)
Total Protein: 6.2 g/dL — ABNORMAL LOW (ref 6.5–8.1)

## 2016-01-16 LAB — CBC
HCT: 33.4 % — ABNORMAL LOW (ref 36.0–46.0)
Hemoglobin: 11.1 g/dL — ABNORMAL LOW (ref 12.0–15.0)
MCH: 28.5 pg (ref 26.0–34.0)
MCHC: 33.2 g/dL (ref 30.0–36.0)
MCV: 85.9 fL (ref 78.0–100.0)
Platelets: 186 10*3/uL (ref 150–400)
RBC: 3.89 MIL/uL (ref 3.87–5.11)
RDW: 13.9 % (ref 11.5–15.5)
WBC: 8.7 10*3/uL (ref 4.0–10.5)

## 2016-01-16 LAB — URINE CULTURE
Culture: NO GROWTH
Special Requests: NORMAL

## 2016-01-16 NOTE — Progress Notes (Signed)
Internal Medicine Attending:   I saw and examined the patient. I reviewed Dr Rivet's note and I agree with the resident's findings and plan as documented in her note. On Dr rivet and my evaluation of Ms Hitchman this morning she reported doing fell, she denied any abdominal pain other than some incisional pain.  On exam RUQ bandages are intact, JP drain in place with small amount of serosanguinous fluid.  Lungs are CTA, she is currently not on any supplemental O2.   Overall she was found to have Acute on Chronic Cholecystitis, her IOC revealed stones in CBD and she will undergo ERCP tomorrow. We have noted GI evaluated patient this afternoon, noted some new abdominal pain and ordered KUB, will follow.

## 2016-01-16 NOTE — Progress Notes (Signed)
[facility] Progress Note  1 Day Post-Op  Subjective: Pt states her pain is greatly improved since her admission. Still some mild abdominal tenderness and pt states some abdominal bloating. No nausea or vomiting. No new complaints   Objective: Vital signs in last 24 hours: Temp:  [97.3 F (36.3 C)-98.7 F (37.1 C)] 97.9 F (36.6 C) (12/14 0629) Pulse Rate:  [69-78] 76 (12/14 0629) Resp:  [17-20] 18 (12/14 0629) BP: (125-148)/(64-77) 147/68 (12/14 0629) SpO2:  [93 %-98 %] 94 % (12/14 0629) Weight:  [220 lb (99.8 kg)] 220 lb (99.8 kg) (12/14 0443) Last BM Date: 01/11/16  Intake/Output from previous day: 12/13 0701 - 12/14 0700 In: 1477 [P.O.:180; I.V.:1147; IV Piggyback:150] Out: 1085 [Urine:1000; Drains:80; Blood:5] Intake/Output this shift: No intake/output data recorded.  PE: General: pleasant, WD/WN AA elderly female who is laying in bed in NAD HEENT: head is normocephalic, atraumatic. No scleral icterus.  Heart: regular rate and irregular rhythm.  No obvious murmurs, gallops, or rubs noted. Lungs: Respiratory effort normal and nonlabored, [place] in place Abd: soft, mildly distended, hypoactive BS, mild TTP to the RUQ. incisions C/D/I, drain with minimal sanguinous drainage Skin: warm and dry with no rashes noted Neuro: normal speech  Lab Results:   Recent Labs  01/14/16 0553 01/15/16 0722  WBC 8.8 6.4  HGB 11.3* 10.5*  HCT 34.0* 31.3*  PLT 147* 161   BMET  Recent Labs  01/14/16 0553 01/15/16 0722  NA 139 141  K 3.7 3.6  CL 105 107  CO2 25 28  GLUCOSE 144* 107*  BUN 18 15  CREATININE 1.06* 0.88  CALCIUM 9.4 9.2   PT/INR No results for input(s): LABPROT, INR in the last 72 hours. CMP     Component Value Date/Time   NA 141 01/15/2016 0722   K 3.6 01/15/2016 0722   CL 107 01/15/2016 0722   CO2 28 01/15/2016 0722   GLUCOSE 107 (H) 01/15/2016 0722   BUN 15 01/15/2016 0722   CREATININE 0.88 01/15/2016 0722   CREATININE 0.72 08/14/2014 1554    CALCIUM 9.2 01/15/2016 0722   PROT 6.0 (L) 01/14/2016 0553   ALBUMIN 2.9 (L) 01/14/2016 0553   AST 131 (H) 01/14/2016 0553   ALT 109 (H) 01/14/2016 0553   ALKPHOS 119 01/14/2016 0553   BILITOT 1.4 (H) 01/14/2016 0553   GFRNONAA >60 01/15/2016 0722   GFRNONAA 81 08/14/2014 1554   GFRAA >60 01/15/2016 0722   GFRAA >89 08/14/2014 1554   Lipase     Component Value Date/Time   LIPASE 15 01/12/2016 0003       Studies/Results: Dg Cholangiogram Operative  Result Date: 01/15/2016 CLINICAL DATA:  Laparoscopic cholecystectomy EXAM: INTRAOPERATIVE CHOLANGIOGRAM TECHNIQUE: Cholangiographic images from the C-arm fluoroscopic device were submitted for interpretation post-operatively. Please see the procedural report for the amount of contrast and the fluoroscopy time utilized. COMPARISON:  None. FINDINGS: Several incompletely occlusive filling defects in the distal CBD. Intrahepatic ducts are incompletely visualized, appearing mildly ectatic centrally. Contrast passes into the duodenum. : 1. Filling defects in the distal CBD suggesting retained calculi, incompletely occlusive. Electronically Signed   By: Lucrezia Europe M.D.   On: 01/15/2016 15:04   Nm Hepatobiliary Including Gb  Result Date: 01/15/2016 CLINICAL DATA:  Hepatic cholelithiasis, calculus of the bile duct with cholecystitis without obstruction, unspecified cholecystitis acuity, abnormal ultrasound, RIGHT upper quadrant pain with nausea and vomiting since last Thursday EXAM: NUCLEAR MEDICINE HEPATOBILIARY IMAGING TECHNIQUE: Sequential images of the abdomen were obtained out to 60  minutes following intravenous administration of radiopharmaceutical. RADIOPHARMACEUTICALS:  5.0 mCi Tc-70m  Choletec IV COMPARISON:  Ultrasound abdomen 01/13/2016 FINDINGS: Normal tracer extraction from bloodstream indicating normal hepatocellular function. Prompt excretion of tracer into biliary tree. Small bowel visualized at 14 minutes. By 1 hour, gallbladder had  not visualized. Patient then received 3 mg of morphine IV and imaging was continued. Delayed visualization of the gallbladder is seen at 12 minutes following morphine augmentation. This indicates patency of the cystic duct. Findings are most consistent with chronic cholecystitis. IMPRESSION: Patent CBD. Delayed visualization of the gallbladder, not seen until following morphine augmentation, indicating patency of the cystic duct and most consistent with chronic cholecystitis. Electronically Signed   By: Lavonia Dana M.D.   On: 01/15/2016 08:51    Anti-infectives: Anti-infectives    Start     Dose/Rate Route Frequency Ordered Stop   01/15/16 1415  piperacillin-tazobactam (ZOSYN) IVPB 3.375 g  Status:  Discontinued     3.375 g 100 mL/hr over 30 Minutes Intravenous STAT 01/15/16 1404 01/15/16 1634   01/14/16 1200  piperacillin-tazobactam (ZOSYN) IVPB 3.375 g     3.375 g 12.5 mL/hr over 240 Minutes Intravenous Every 8 hours 01/14/16 1133     01/13/16 1500  cefTRIAXone (ROCEPHIN) 2 g in dextrose 5 % 50 mL IVPB  Status:  Discontinued     2 g 100 mL/hr over 30 Minutes Intravenous Every 24 hours 01/13/16 1424 01/14/16 1132       Assessment/Plan  Cholecystitis, s/p cholecystectomy and IOC, 12/13 Dr. Hulen Skains - pt with choledocholithiasis seen on IOC.  - ERCP scheduled for Friday 12/15 at 1300 - Rocephin 12/11>>  CHF COPD on home O2 but not at rest DM HTN  FEN: clears then NPO at midnight VTE: SCD's and lovenox ID: Rocephin 12/11 for one dose. Zosyn 12/12>>  Plan: ERCP tomorrow, clears then NPO at midnight, CBC and CMP pending to check WBC and LFT's   LOS: 3 days    Kalman Drape , [facility] 01/16/2016, 9:04 AM Pager: [phone] Consults: [phone] Mon-Fri 7:00 am-4:30 pm Sat-Sun 7:00 am-11:30 am

## 2016-01-16 NOTE — Progress Notes (Signed)
Daily Rounding Note  01/16/2016, 1:30 PM  LOS: 3 days   SUBJECTIVE:   Chief complaint: queasiness and bloating and epigastirc pain after taking clears this AM.  No n/v.  Pain different than before surgery and separate from post op soreness.       OBJECTIVE:         Vital signs in last 24 hours:    Temp:  [97.3 F (36.3 C)-98.7 F (37.1 C)] 97.9 F (36.6 C) (12/14 0629) Pulse Rate:  [72-78] 76 (12/14 0629) Resp:  [17-20] 18 (12/14 0629) BP: (125-148)/(66-77) 147/68 (12/14 0629) SpO2:  [93 %-98 %] 94 % (12/14 0629) Weight:  [99.8 kg (220 lb)] 99.8 kg (220 lb) (12/14 0443) Last BM Date: 01/11/16 Filed Weights   01/14/16 0500 01/15/16 0500 01/16/16 0443  Weight: 94 kg (207 lb 4.8 oz) 98.4 kg (216 lb 14.4 oz) 99.8 kg (220 lb)   General: ill looking, frail, aged.     Heart: RRR Chest: clear bil.  No SOB Abdomen: obese, protuberant/distended.  Diffusely tender> in upper right and epigastrum  Neuro/Psych:  Alert, oriented, calm.  Moves all 4s.    Intake/Output from previous day: 12/13 0701 - 12/14 0700 In: 1477 [P.O.:180; I.V.:1147; IV Piggyback:150] Out: 1085 [Urine:1000; Drains:80; Blood:5]  Intake/Output this shift: Total I/O In: 0  Out: 400 [Urine:400]  Lab Results:  Recent Labs  01/14/16 0553 01/15/16 0722 01/16/16 0917  WBC 8.8 6.4 8.7  HGB 11.3* 10.5* 11.1*  HCT 34.0* 31.3* 33.4*  PLT 147* 161 186   BMET  Recent Labs  01/14/16 0553 01/15/16 0722 01/16/16 0917  NA 139 141 141  K 3.7 3.6 4.0  CL 105 107 108  CO2 25 28 23   GLUCOSE 144* 107* 131*  BUN 18 15 11   CREATININE 1.06* 0.88 0.84  CALCIUM 9.4 9.2 9.4   LFT  Recent Labs  01/14/16 0553 01/16/16 0917  PROT 6.0* 6.2*  ALBUMIN 2.9* 2.8*  AST 131* 52*  ALT 109* 70*  ALKPHOS 119 135*  BILITOT 1.4* 1.1   PT/INR No results for input(s): LABPROT, INR in the last 72 hours. Hepatitis Panel No results for input(s): HEPBSAG, HCVAB,  HEPAIGM, HEPBIGM in the last 72 hours.  Studies/Results: Dg Cholangiogram Operative  Result Date: 01/15/2016 CLINICAL DATA:  Laparoscopic cholecystectomy EXAM: INTRAOPERATIVE CHOLANGIOGRAM TECHNIQUE: Cholangiographic images from the C-arm fluoroscopic device were submitted for interpretation post-operatively. Please see the procedural report for the amount of contrast and the fluoroscopy time utilized. COMPARISON:  None. FINDINGS: Several incompletely occlusive filling defects in the distal CBD. Intrahepatic ducts are incompletely visualized, appearing mildly ectatic centrally. Contrast passes into the duodenum. : 1. Filling defects in the distal CBD suggesting retained calculi, incompletely occlusive. Electronically Signed   By: Lucrezia Europe M.D.   On: 01/15/2016 15:04   Nm Hepatobiliary Including Gb  Result Date: 01/15/2016 CLINICAL DATA:  Hepatic cholelithiasis, calculus of the bile duct with cholecystitis without obstruction, unspecified cholecystitis acuity, abnormal ultrasound, RIGHT upper quadrant pain with nausea and vomiting since last Thursday EXAM: NUCLEAR MEDICINE HEPATOBILIARY IMAGING TECHNIQUE: Sequential images of the abdomen were obtained out to 60 minutes following intravenous administration of radiopharmaceutical. RADIOPHARMACEUTICALS:  5.0 mCi Tc-58m Choletec IV COMPARISON:  Ultrasound abdomen 01/13/2016 FINDINGS: Normal tracer extraction from bloodstream indicating normal hepatocellular function. Prompt excretion of tracer into biliary tree. Small bowel visualized at 14 minutes. By 1 hour, gallbladder had not visualized. Patient then received 3 mg of morphine IV  and imaging was continued. Delayed visualization of the gallbladder is seen at 12 minutes following morphine augmentation. This indicates patency of the cystic duct. Findings are most consistent with chronic cholecystitis. IMPRESSION: Patent CBD. Delayed visualization of the gallbladder, not seen until following morphine  augmentation, indicating patency of the cystic duct and most consistent with chronic cholecystitis. Electronically Signed   By: Lavonia Dana M.D.   On: 01/15/2016 08:51    Scheduled Meds: . amLODipine  5 mg Oral Daily  . aspirin EC  81 mg Oral Daily  . carvedilol  25 mg Oral BID WC  . enoxaparin (LOVENOX) injection  40 mg Subcutaneous Q24H  . insulin aspart  0-9 Units Subcutaneous Q4H  . levothyroxine  88 mcg Oral QAC breakfast  . mometasone-formoterol  2 puff Inhalation BID  . pantoprazole  40 mg Oral Daily  . PARoxetine  30 mg Oral Daily  . piperacillin-tazobactam (ZOSYN)  IV  3.375 g Intravenous Q8H  . tiotropium  18 mcg Inhalation Daily   Continuous Infusions: . lactated ringers 10 mL/hr at 01/15/16 1215   PRN Meds:.albuterol, HYDROmorphone (DILAUDID) injection, ondansetron (ZOFRAN) IV  ASSESMENT:   *  Acute on chronic cholecystitis. Status post laparoscopic cholecystectomy 12/13  *  Choledocholithiasis on intraoperative cholangiogram. t bili, transaminases improved but alk phos up. Clinically looks like she has an ileus  *  Normocytic anemia.     PLAN   *  ERCP set for tomorrow at 1 PM with Dr Henrene Pastor.  Discussed the risks, benefits of the procedure and its sedation.  She is agreeable.   Continue Zosyn,  Stop Lovenox.  KUB now.  Lipase and CMET in AM   2:56 Addendum: portable KUB: non-obstructive BGP.  No ileus.     Azucena Freed  01/16/2016, 1:30 PM Pager: [phone]

## 2016-01-16 NOTE — Progress Notes (Signed)
   Subjective:  No acute events overnight. Reports minimal pain near her surgical incision sites. We discussed the ERCP procedure she will undergo tomorrow.   Objective:  Vital signs in last 24 hours: Vitals:   01/15/16 1638 01/15/16 2055 01/16/16 0443 01/16/16 0629  BP: 125/69 (!) 142/77  (!) 147/68  Pulse: 72 78  76  Resp: 18 17  18   Temp: 98.4 F (36.9 C) 98.7 F (37.1 C)  97.9 F (36.6 C)  TempSrc: Oral Oral  Oral  SpO2: 98% 93%  94%  Weight:   220 lb (99.8 kg)    Physical Exam  Constitutional: She is oriented to person, place, and time. She appears well-developed and well-nourished.  HENT:  Head: Normocephalic and atraumatic.  Cardiovascular: Normal rate and regular rhythm.  Exam reveals no gallop and no friction rub.   No murmur heard. Respiratory: Effort normal and breath sounds normal. No respiratory distress.  No Wheezing   GI: Soft. Bowel sounds are normal. There is tenderness.  Tenderness near surgical incisions. RUQ abdominal pain improving. JP tube on right side with small amount of serosanguinous fluid  Musculoskeletal: She exhibits no edema.  Neurological: She is alert and oriented to person, place, and time.     Assessment/Plan:   Caitlyn Aguirre is a 80 year old female with multiple comorbidities presenting with right upper quadrant abdominal pain, leukocytosis and ultrasound demonstrating pericholecystic fluid consistent with the diagnosis of acute cholecystitis.  1. Acute vs chronic cholecystitis: s/p cholecystectomy yesterday (12/13). She has a JP tube drain in place with small amount of serosanguinous fluid present. She is doing well post-op. She will go for ERCP tomorrow with multiple CBD stones seen on intraoperative cholangiogram. - Clears now, NPO at midnight - Zosyn per pharmacy  - Hydromorphone .5-1mg  q2 hrs prn pain  - Appreciate surgery and GI's consultation on this case   2. DM -- Continue Insulin Aspart  3. Hypothyroidism  -- Continue  Levothyroxine   4. HTN and Hx of diastolic CHF Currently normotensive  -- Amlodipine 5mg  once daily -- Carvedilol 25mg  bid -- Hold lisinopril, likely resume tomorrow   5. COPD -- Dulera -- Spiriva -- Home 1 - 2 liters oxygen -- Albuterol 2.5 q6 hours prn wheezing   6. AKI Cr of 1.09 on admission, at baseline around .80. Most likely 2/2 decreased PO intake associated with n/v. Cr improved to 0.84 after IVFs. - Resume Lisinopril tomorrow  7. Depression -- Paroxetine 30mg    DVT/PE prophylaxis: Lovenox FEN/GI: Clears now, NPO at midnight  CODE: Full code   Dispo: Anticipated discharge pending intervention and recovery.   Juliet Rude, MD 01/16/2016, 1:15 PM Pager: [phone]

## 2016-01-17 ENCOUNTER — Encounter (HOSPITAL_COMMUNITY)
Admission: AD | Disposition: A | Discharge status: Home Health | Payer: Self-pay | Source: Ambulatory Visit | Attending: Internal Medicine

## 2016-01-17 ENCOUNTER — Inpatient Hospital Stay (HOSPITAL_COMMUNITY): Payer: Medicare Other

## 2016-01-17 ENCOUNTER — Inpatient Hospital Stay (HOSPITAL_COMMUNITY): Payer: Medicare Other | Admitting: Anesthesiology

## 2016-01-17 ENCOUNTER — Encounter (HOSPITAL_COMMUNITY): Payer: Self-pay | Admitting: Internal Medicine

## 2016-01-17 DIAGNOSIS — Z9049 Acquired absence of other specified parts of digestive tract: Secondary | ICD-10-CM

## 2016-01-17 DIAGNOSIS — K833 Fistula of bile duct: Secondary | ICD-10-CM

## 2016-01-17 DIAGNOSIS — K8050 Calculus of bile duct without cholangitis or cholecystitis without obstruction: Secondary | ICD-10-CM

## 2016-01-17 HISTORY — PX: ERCP: SHX5425

## 2016-01-17 LAB — COMPREHENSIVE METABOLIC PANEL
ALT: 52 U/L (ref 14–54)
AST: 30 U/L (ref 15–41)
Albumin: 2.7 g/dL — ABNORMAL LOW (ref 3.5–5.0)
Alkaline Phosphatase: 114 U/L (ref 38–126)
Anion gap: 8 (ref 5–15)
BUN: 7 mg/dL (ref 6–20)
CO2: 27 mmol/L (ref 22–32)
Calcium: 9.6 mg/dL (ref 8.9–10.3)
Chloride: 107 mmol/L (ref 101–111)
Creatinine, Ser: 0.79 mg/dL (ref 0.44–1.00)
GFR calc Af Amer: >60 mL/min (ref >60)
GFR calc non Af Amer: >60 mL/min (ref >60)
Glucose, Bld: 131 mg/dL — ABNORMAL HIGH (ref 65–99)
Potassium: 3.6 mmol/L (ref 3.5–5.1)
Sodium: 142 mmol/L (ref 135–145)
Total Bilirubin: 1.5 mg/dL — ABNORMAL HIGH (ref 0.3–1.2)
Total Protein: 5.9 g/dL — ABNORMAL LOW (ref 6.5–8.1)

## 2016-01-17 LAB — GLUCOSE, CAPILLARY
Glucose-Capillary: 123 mg/dL — ABNORMAL HIGH (ref 65–99)
Glucose-Capillary: 123 mg/dL — ABNORMAL HIGH (ref 65–99)
Glucose-Capillary: 133 mg/dL — ABNORMAL HIGH (ref 65–99)
Glucose-Capillary: 144 mg/dL — ABNORMAL HIGH (ref 65–99)
Glucose-Capillary: 153 mg/dL — ABNORMAL HIGH (ref 65–99)
Glucose-Capillary: 237 mg/dL — ABNORMAL HIGH (ref 65–99)

## 2016-01-17 LAB — CBC
HCT: 32.6 % — ABNORMAL LOW (ref 36.0–46.0)
Hemoglobin: 10.9 g/dL — ABNORMAL LOW (ref 12.0–15.0)
MCH: 28.5 pg (ref 26.0–34.0)
MCHC: 33.4 g/dL (ref 30.0–36.0)
MCV: 85.1 fL (ref 78.0–100.0)
Platelets: 195 10*3/uL (ref 150–400)
RBC: 3.83 MIL/uL — ABNORMAL LOW (ref 3.87–5.11)
RDW: 13.9 % (ref 11.5–15.5)
WBC: 7 10*3/uL (ref 4.0–10.5)

## 2016-01-17 LAB — LIPASE, BLOOD: Lipase: 11 U/L (ref 11–51)

## 2016-01-17 SURGERY — ERCP, WITH INTERVENTION IF INDICATED
Anesthesia: General

## 2016-01-17 MED ORDER — GLUCAGON HCL RDNA (DIAGNOSTIC) 1 MG IJ SOLR
INTRAMUSCULAR | Status: DC | PRN
  Administered 2016-01-17: .5 mg via INTRAVENOUS

## 2016-01-17 MED ORDER — ROCURONIUM BROMIDE 100 MG/10ML IV SOLN
INTRAVENOUS | Status: DC | PRN
  Administered 2016-01-17: 35 mg via INTRAVENOUS

## 2016-01-17 MED ORDER — PROPOFOL 10 MG/ML IV BOLUS
INTRAVENOUS | Status: DC | PRN
  Administered 2016-01-17: 120 mg via INTRAVENOUS

## 2016-01-17 MED ORDER — SUCCINYLCHOLINE CHLORIDE 20 MG/ML IJ SOLN
INTRAMUSCULAR | Status: DC | PRN
  Administered 2016-01-17: 100 mg via INTRAVENOUS

## 2016-01-17 MED ORDER — OXYCODONE HCL 5 MG PO TABS
5.0000 mg | ORAL_TABLET | ORAL | Status: DC | PRN
  Administered 2016-01-17: 10 mg via ORAL
  Administered 2016-01-18: 5 mg via ORAL
  Filled 2016-01-17: qty 2
  Filled 2016-01-17: qty 1

## 2016-01-17 MED ORDER — INDOMETHACIN 50 MG RE SUPP
RECTAL | Status: AC
  Filled 2016-01-17: qty 2

## 2016-01-17 MED ORDER — GLUCAGON HCL RDNA (DIAGNOSTIC) 1 MG IJ SOLR
INTRAMUSCULAR | Status: AC
  Filled 2016-01-17: qty 1

## 2016-01-17 MED ORDER — INDOMETHACIN 50 MG RE SUPP
RECTAL | Status: DC | PRN
  Administered 2016-01-17: 100 mg via RECTAL

## 2016-01-17 MED ORDER — LIDOCAINE HCL (CARDIAC) 20 MG/ML IV SOLN
INTRAVENOUS | Status: DC | PRN
  Administered 2016-01-17: 50 mg via INTRATRACHEAL

## 2016-01-17 MED ORDER — FENTANYL CITRATE (PF) 100 MCG/2ML IJ SOLN
INTRAMUSCULAR | Status: DC | PRN
  Administered 2016-01-17: 50 ug via INTRAVENOUS

## 2016-01-17 MED ORDER — SUGAMMADEX SODIUM 200 MG/2ML IV SOLN
INTRAVENOUS | Status: DC | PRN
  Administered 2016-01-17: 200 mg via INTRAVENOUS

## 2016-01-17 MED ORDER — GLUCAGON HCL RDNA (DIAGNOSTIC) 1 MG IJ SOLR
INTRAMUSCULAR | Status: AC
Start: 2016-01-17 — End: 2016-01-17
  Filled 2016-01-17: qty 1

## 2016-01-17 MED ORDER — PHENYLEPHRINE HCL 10 MG/ML IJ SOLN
INTRAVENOUS | Status: DC | PRN
  Administered 2016-01-17: 10 ug/min via INTRAVENOUS

## 2016-01-17 MED ORDER — LISINOPRIL 20 MG PO TABS
20.0000 mg | ORAL_TABLET | Freq: Every day | ORAL | Status: DC
  Administered 2016-01-18: 20 mg via ORAL
  Filled 2016-01-17 (×2): qty 1

## 2016-01-17 MED ORDER — INDOMETHACIN 50 MG RE SUPP
100.0000 mg | Freq: Once | RECTAL | Status: AC
  Administered 2016-01-17: 100 mg via RECTAL
  Filled 2016-01-17: qty 2

## 2016-01-17 MED ORDER — IOPAMIDOL (ISOVUE-370) INJECTION 76%
INTRAVENOUS | Status: DC | PRN
  Administered 2016-01-17: 50 mL via INTRAVENOUS

## 2016-01-17 MED ORDER — IOPAMIDOL (ISOVUE-300) INJECTION 61%
INTRAVENOUS | Status: AC
  Filled 2016-01-17: qty 50

## 2016-01-17 MED ORDER — SODIUM CHLORIDE 0.9 % IV SOLN: INTRAVENOUS | Status: DC

## 2016-01-17 MED ORDER — ONDANSETRON HCL 4 MG/2ML IJ SOLN
INTRAMUSCULAR | Status: DC | PRN
  Administered 2016-01-17: 4 mg via INTRAVENOUS

## 2016-01-17 NOTE — Care Management Important Message (Signed)
Important Message  Patient Details  Name: Caitlyn Aguirre MRN: [id number] Date of Birth: 1935/04/29   Medicare Important Message Given:  Yes    [name] 01/17/2016, 10:07 AM

## 2016-01-17 NOTE — Progress Notes (Signed)
[facility] Progress Note  2 Days Post-Op  Subjective: Abdominal pain has improved. No events overnight. No new complaints.   Objective: Vital signs in last 24 hours: Temp:  [97.8 F (36.6 C)-99.7 F (37.6 C)] 99.7 F (37.6 C) (12/15 0439) Pulse Rate:  [77-80] 80 (12/15 0439) Resp:  [18] 18 (12/15 0439) BP: (127-161)/(69-76) 161/72 (12/15 0439) SpO2:  [95 %-97 %] 96 % (12/15 0439) Weight:  [219 lb 12.8 oz (99.7 kg)] 219 lb 12.8 oz (99.7 kg) (12/15 0439) Last BM Date: 01/11/16  Intake/Output from previous day: 12/14 0701 - 12/15 0700 In: 1910 [P.O.:1560; I.V.:200; IV Piggyback:150] Out: [id number] [Urine:1700; Drains:110] Intake/Output this shift: No intake/output data recorded.  PE: General:pleasant, WD/WN AA elderly femalewho is laying in bed in NAD HEENT: head is normocephalic, atraumatic. No scleral icterus.  Heart: regular rate and irregularrhythm. No obvious murmurs, gallops, or rubs noted. Lungs: Respiratory effort normal andnonlabored, [place] in place, CTA [id number], ND, +BS, mild TTP to the RUQ. incisions C/D/I, drain with minimal sanguinous drainage Skin:warm and dry with no rashes noted Neuro:normal speech  Lab Results:   Recent Labs  01/16/16 0917 01/17/16 0624  WBC 8.7 7.0  HGB 11.1* 10.9*  HCT 33.4* 32.6*  PLT 186 195   BMET  Recent Labs  01/16/16 0917 01/17/16 0624  NA 141 142  K 4.0 3.6  CL 108 107  CO2 23 27  GLUCOSE 131* 131*  BUN 11 7  CREATININE 0.84 0.79  CALCIUM 9.4 9.6   PT/INR No results for input(s): LABPROT, INR in the last 72 hours. CMP     Component Value Date/Time   NA 142 01/17/2016 0624   K 3.6 01/17/2016 0624   CL 107 01/17/2016 0624   CO2 27 01/17/2016 0624   GLUCOSE 131 (H) 01/17/2016 0624   BUN 7 01/17/2016 0624   CREATININE 0.79 01/17/2016 0624   CREATININE 0.72 08/14/2014 1554   CALCIUM 9.6 01/17/2016 0624   PROT 5.9 (L) 01/17/2016 0624   ALBUMIN 2.7 (L) 01/17/2016 0624   AST 30 01/17/2016 0624    ALT 52 01/17/2016 0624   ALKPHOS 114 01/17/2016 0624   BILITOT 1.5 (H) 01/17/2016 0624   GFRNONAA >60 01/17/2016 0624   GFRNONAA 81 08/14/2014 1554   GFRAA >60 01/17/2016 0624   GFRAA >89 08/14/2014 1554   Lipase     Component Value Date/Time   LIPASE 11 01/17/2016 0624       Studies/Results: Dg Cholangiogram Operative  Result Date: 01/15/2016 CLINICAL DATA:  Laparoscopic cholecystectomy EXAM: INTRAOPERATIVE CHOLANGIOGRAM TECHNIQUE: Cholangiographic images from the C-arm fluoroscopic device were submitted for interpretation post-operatively. Please see the procedural report for the amount of contrast and the fluoroscopy time utilized. COMPARISON:  None. FINDINGS: Several incompletely occlusive filling defects in the distal CBD. Intrahepatic ducts are incompletely visualized, appearing mildly ectatic centrally. Contrast passes into the duodenum. : 1. Filling defects in the distal CBD suggesting retained calculi, incompletely occlusive. Electronically Signed   By: Lucrezia Europe M.D.   On: 01/15/2016 15:04   Dg Abd Portable 1v  Result Date: 01/16/2016 CLINICAL DATA:  Ileus following gastrointestinal surgery [id number] EXAM: PORTABLE ABDOMEN - 1 VIEW COMPARISON:  None. FINDINGS: Bowel gas pattern is nonobstructed. Gas is present throughout nondilated loops of large and small bowel. Surgical clips are noted in the right upper quadrant the abdomen. No abnormal calcifications. No evidence for organomegaly. IMPRESSION: No evidence for acute  abnormality. Electronically Signed   By: Nolon Nations M.D.   On: 01/16/2016  14:29    Anti-infectives: Anti-infectives    Start     Dose/Rate Route Frequency Ordered Stop   01/15/16 1415  piperacillin-tazobactam (ZOSYN) IVPB 3.375 g  Status:  Discontinued     3.375 g 100 mL/hr over 30 Minutes Intravenous STAT 01/15/16 1404 01/15/16 1634   01/14/16 1200  piperacillin-tazobactam (ZOSYN) IVPB 3.375 g     3.375 g 12.5 mL/hr over 240 Minutes Intravenous  Every 8 hours 01/14/16 1133     01/13/16 1500  cefTRIAXone (ROCEPHIN) 2 g in dextrose 5 % 50 mL IVPB  Status:  Discontinued     2 g 100 mL/hr over 30 Minutes Intravenous Every 24 hours 01/13/16 1424 01/14/16 1132       Assessment/Plan  Cholecystitis, s/p cholecystectomy and IOC, 12/13 Dr. Hulen Skains - pt with choledocholithiasis seen on IOC.  - ERCP scheduled for today at 1300 - Rocephin 12/11>>  CHF COPD on home O2 but not at rest DM HTN  FEN: NPO with upcoming ERCP VTE: SCD's and lovenox ID: Rocephin 12/11 for one dose. Zosyn 12/12>>  Plan: ERCP today, WBC and LFT's WNL   LOS: 4 days    Kalman Drape , [facility] 01/17/2016, 7:48 AM Pager: [phone] Consults: [phone] Mon-Fri 7:00 am-4:30 pm Sat-Sun 7:00 am-11:30 am

## 2016-01-17 NOTE — Discharge Summary (Signed)
Name: Caitlyn Aguirre MRN: [id number] DOB: [birth year] 80 y.o. PCP: Axel Filler, MD  Date of Admission: 01/13/2016  2:14 PM Date of Discharge: 01/20/2016 Attending Physician: No att. providers found  Discharge Diagnosis: 1. Acute cholecystitis 2. Choledocholithiasis Principal Problem:   Acute cholecystitis Active Problems:   Diabetes mellitus ([place])   Essential hypertension   COPD ([place])   Normal coronary arteries   Acute cholecystitis   Cholelithiasis   Choledocholithiasis   Discharge Medications: Allergies as of 01/18/2016   No Known Allergies     Medication List    TAKE these medications   ACCU-CHEK FASTCLIX LANCETS Misc Use as directed   ACCU-CHEK GUIDE w/Device Kit 1 each by Does not apply route as needed.   albuterol 108 (90 Base) MCG/ACT inhaler Commonly known as:  PROVENTIL HFA;VENTOLIN HFA Inhale 1-2 puffs into the lungs every 6 (six) hours as needed for wheezing or shortness of breath.   amLODipine 5 MG tablet Commonly known as:  NORVASC Take 1 tablet (5 mg total) by mouth daily.   aspirin EC 81 MG tablet Take 1 tablet (81 mg total) by mouth daily.   atorvastatin 40 MG tablet Commonly known as:  LIPITOR Take 1 tablet by mouth every evening at 6PM What changed:  how much to take  how to take this  when to take this  additional instructions   carvedilol 25 MG tablet Commonly known as:  COREG Take 1 tablet (25 mg total) by mouth 2 (two) times daily with a meal.   Fluticasone-Salmeterol 250-50 MCG/DOSE Aepb Commonly known as:  ADVAIR DISKUS Inhale 1 puff into the lungs 2 (two) times daily.   glucose blood test strip Commonly known as:  ACCU-CHEK GUIDE Use as instructed   HYDROcodone-acetaminophen 5-325 MG tablet Commonly known as:  NORCO Take 1-2 tablets by mouth every 6 (six) hours as needed for moderate pain or severe pain.   levothyroxine 88 MCG tablet Commonly known as:  SYNTHROID, LEVOTHROID Take 1 tablet by mouth  every morning 30 minutes before other medications and food. Take with water only. What changed:  how much to take  how to take this  when to take this  additional instructions   lisinopril 20 MG tablet Commonly known as:  PRINIVIL,ZESTRIL Take 1 tablet (20 mg total) by mouth daily.   metFORMIN 1000 MG tablet Commonly known as:  GLUCOPHAGE Take 1 tablet (1,000 mg total) by mouth 2 (two) times daily with a meal.   omeprazole 20 MG capsule Commonly known as:  PRILOSEC Take 1 capsule (20 mg total) by mouth daily with supper.   omeprazole 20 MG capsule Commonly known as:  PRILOSEC TAKE 1 CAPSULE BY MOUTH EVERY DAY   ondansetron 4 MG tablet Commonly known as:  ZOFRAN Take 1 tablet (4 mg total) by mouth every 6 (six) hours as needed for nausea or vomiting.   OXYGEN Inhale into the lungs at bedtime as needed (for breathing).   PARoxetine 30 MG tablet Commonly known as:  PAXIL Take 1 tablet (30 mg total) by mouth daily.   tiotropium 18 MCG inhalation capsule Commonly known as:  SPIRIVA HANDIHALER INHALE THE CONTENTS OF 1 CAPSULE VIA HANDIHALER BY MOUTH EVERY DAY What changed:  how much to take  how to take this  when to take this  additional instructions       Disposition and follow-up:   Ms.Xyla S Olivero was discharged from [facility] in Good condition.  At  the hospital follow up visit please address:  1.  Please ensure the patient pain is well-controlled.  2.  Labs / imaging needed at time of follow-up: None  3.  Pending labs/ test needing follow-up: None  Follow-up Appointments: Follow-up Information    [place]. Schedule an appointment as soon as possible for a visit on 01/20/2016.   Contact information: [address] [place] [phone]       Judeth Horn, MD. Schedule an appointment as soon as possible for a visit in 3 week(s).   Specialty:  General Surgery Contact  information: [place] [address] [phone]        Barrie Folk, MD .   Specialty:  Cardiothoracic Surgery Contact information: [address] [place] [address] [phone]           Hospital Course by problem list: Principal Problem:   Acute cholecystitis Active Problems:   Diabetes mellitus ([place])   Essential hypertension   COPD ([place])   Normal coronary arteries   Acute cholecystitis   Cholelithiasis   Choledocholithiasis   1. Acute cholecystitis The patient was admitted to the [facility] from the [facility] internal medicine teaching clinic on 01/13/2016 with a 3-4 day history of right upper quadrant abdominal pain. In the clinic prior to admission the patient was found to be afebrile. Exam was positive for Murphy sign. Labs were significant for leukocytosis with a white blood cell count of 19.8. Comprehensive metabolic panel demonstrated elevated glucose and creatinine. The patient had a right upper quadrant ultrasound which demonstrated a distended gallbladder containing stones with thickened wall and pericholecystic fluid and edema. Once admitted the patient was started on IV Zosyn and surgery was consulted. Following a HIDA scan the patient underwent laparoscopic cholecystectomy and intraoperative cholangiogram.  Intraoperative cholangiogram revealed choledocholithiasis and the patient subsequently had endoscopic retrograde cholangiopancreatography with removal of the stone. Both the surgery and ERCP were without complication. The day following her ERCP the patient was doing well and was  requesting to go home. Her JP drainage was serosanguineous and the drain was removed. Her abdomen was soft and nontender to examination. She was formally alert and oriented 3 per chart review. She was given instructions on diet and activities. She will have follow-up with surgery in 2 weeks.  2. Choledocholithiasis The patient  presented with right upper quadrant abdominal pain, leukocytosis and ultrasound findings consistent with acute cholecystitis. She underwent laparoscopic cholecystectomy with intraoperative cholangiogram. Intraoperative cholangiogram demonstrated choledocholithiasis and the patient subsequently underwent endoscopic retrograde cholangiopancreatography. One stone was removed. The patient recovered appropriately following these procedures. On the day of discharge the patient was afebrile he would ultimately stable. Her JP drain was removed. She'll be discharged with surgery follow-up.   Discharge Vitals:   BP (!) 152/63 (BP Location: Right Arm)   Pulse 62   Temp 98.5 F (36.9 C) (Oral)   Resp 18   Wt 218 lb 6.4 oz (99.1 kg)   SpO2 97%   BMI 33.21 kg/m   Pertinent Labs, Studies, and Procedures:  1. Right upper quadrant ultrasound - distended gallbladder containing stones with a thickened wall and some pericholecystic fluid/edema.  2. HIDA scan-patent common bile duct. Delayed visualization of the gallbladder, not seen until following morphine augmentation. Results are consistent with chronic cholecystitis  3. Intraoperative cholangiogram - filling defects in the distal common bile duct suggesting retained calculi  4. Laparoscopic  cholecystectomy  5. Endoscopic retrograde cholangiopancreatography  Discharge Instructions    Call MD for:  difficulty breathing, headache or visual disturbances    Complete by:  As directed    Call MD for:  hives    Complete by:  As directed    Call MD for:  persistant dizziness or light-headedness    Complete by:  As directed    Call MD for:  persistant nausea and vomiting    Complete by:  As directed    Call MD for:  persistant nausea and vomiting    Complete by:  As directed    Call MD for:  redness, tenderness, or signs of infection (pain, swelling, redness, odor or green/yellow discharge around incision site)    Complete by:  As directed    Call MD  for:  redness, tenderness, or signs of infection (pain, swelling, redness, odor or green/yellow discharge around incision site)    Complete by:  As directed    Call MD for:  severe uncontrolled pain    Complete by:  As directed    Call MD for:  severe uncontrolled pain    Complete by:  As directed    Call MD for:  temperature >100.4    Complete by:  As directed    Call MD for:  temperature >100.4    Complete by:  As directed    Diet - low sodium heart healthy    Complete by:  As directed    Diet - low sodium heart healthy    Complete by:  As directed    Discharge instructions    Complete by:  As directed    You were treated for an acute problem of inflammation in your gallbladder. We have removed your gallbladder and also a gallstone that was obstructing the bile duct. You will follow up with the Surgeons outside the hospital, but it will also be important for follow up with your primary doctor in the Internal Medicine Clinic downstairs.   Discharge instructions    Complete by:  As directed    [facility] LAPAROSCOPIC SURGERY: POST OP INSTRUCTIONS Always review your discharge instruction sheet given to you by the facility where your surgery was performed. IF YOU HAVE DISABILITY OR FAMILY LEAVE FORMS, YOU MUST BRING THEM TO THE OFFICE FOR PROCESSING.   DO NOT GIVE THEM TO YOUR DOCTOR.  A prescription for pain medication may be given to you upon discharge.  Take your pain medication as prescribed, if needed.  If narcotic pain medicine is not needed, then you may take acetaminophen (Tylenol) or ibuprofen (Advil) as needed. Take your usually prescribed medications unless otherwise directed. If you need a refill on your pain medication, please contact your pharmacy.  They will contact our office to request authorization. Prescriptions will not be filled after 5pm or on week-ends. You should follow a light diet the first few days after arrival home, such as soup and  crackers, etc.  Be sure to include lots of fluids daily. Most patients will experience some swelling and bruising in the area of the incisions.  Ice packs will help.  Swelling and bruising can take several days to resolve.  It is common to experience some constipation if taking pain medication after surgery.  Increasing fluid intake and taking a stool softener (such as Colace) will usually help or prevent this problem from occurring.  A mild laxative (Milk of Magnesia or Miralax) should be taken according to package instructions  if there are no bowel movements after 48 hours. Unless discharge instructions indicate otherwise, you may remove your bandages 24-48 hours after surgery, and you may shower at that time.  You may have steri-strips (small skin tapes) in place directly over the incision.  These strips should be left on the skin for 7-10 days.  If your surgeon used skin glue on the incision, you may shower in 24 hours.  The glue will flake off over the next 2-3 weeks.  Any sutures or staples will be removed at the office during your follow-up visit. ACTIVITIES:  You may resume regular (light) daily activities beginning the next day-such as daily self-care, walking, climbing stairs-gradually increasing activities as tolerated.  You may have sexual intercourse when it is comfortable.  Refrain from any heavy lifting or straining until approved by your doctor. You may drive when you are no longer taking prescription pain medication, you can comfortably wear a seatbelt, and you can safely maneuver your car and apply brakes. RETURN TO WORK:  __________________________________________________________ Dennis Bast should see your doctor in the office for a follow-up appointment approximately 2-3 weeks after your surgery.  Make sure that you call for this appointment within a day or two after you arrive home to insure a convenient appointment time. OTHER INSTRUCTIONS:  __________________________________________________________________________________________________________________________ __________________________________________________________________________________________________________________________ WHEN TO CALL YOUR DOCTOR: Fever over 101.0 Inability to urinate Continued bleeding from incision. Increased pain, redness, or drainage from the incision. Increasing abdominal pain  The clinic staff is available to answer your questions during regular business hours.  Please don't hesitate to call and ask to speak to one of the nurses for clinical concerns.  If you have a medical emergency, go to the nearest emergency room or call 911.  A surgeon from [facility] is always on call at the hospital. [address] [place], [place], [place]  [address] ? [address] [place] [address], [place], [place]   [address] [phone] ? [phone] ? FAX [phone] Web site: [email or web address]   Driving Restrictions    Complete by:  As directed    7 days   Increase activity slowly    Complete by:  As directed    Increase activity slowly    Complete by:  As directed    Lifting restrictions    Complete by:  As directed    20 lbs   May shower / Bathe    Complete by:  As directed    May walk up steps    Complete by:  As directed    No wound care    Complete by:  As directed       Signed: Ophelia Shoulder, MD 01/20/2016, 10:18 AM   Pager: [phone]

## 2016-01-17 NOTE — Anesthesia Procedure Notes (Signed)
Procedure Name: Intubation Date/Time: 01/17/2016 2:20 PM Performed by: Mariea Clonts Pre-anesthesia Checklist: Patient identified, Emergency Drugs available, Suction available and Patient being monitored Patient Re-evaluated:Patient Re-evaluated prior to inductionOxygen Delivery Method: Circle System Utilized Preoxygenation: Pre-oxygenation with 100% oxygen Intubation Type: IV induction Ventilation: Mask ventilation without difficulty Laryngoscope Size: Miller and 2 Grade View: Grade I Tube type: Oral Tube size: 7.0 mm Number of attempts: 1 Airway Equipment and Method: Stylet and Oral airway Placement Confirmation: ETT inserted through vocal cords under direct vision,  positive ETCO2 and breath sounds checked- equal and bilateral Tube secured with: Tape Dental Injury: Teeth and Oropharynx as per pre-operative assessment

## 2016-01-17 NOTE — Op Note (Signed)
[facility] Patient Name: Caitlyn Aguirre Procedure Date : 01/17/2016 MRN: [id number] Attending MD: Docia Chuck. Henrene Pastor , MD Date of Birth: 04-08-1935 CSN: [id number] Age: 80 Admit Type: Inpatient Procedure:                ERCP with sphincterotomy and common duct stone                            extraction Indications:              Biliary fistula Providers:                Docia Chuck. Henrene Pastor, MD, Carolynn Comment, RN, Kingsley Plan, RN, Virgilio Belling. Huel Cote, CRNA Referring MD:             Axel Filler Medicines:                Monitored Anesthesia Care Complications:            No immediate complications. Estimated Blood Loss:     Estimated blood loss: none. Procedure:                Pre-Anesthesia Assessment:                           - Prior to the procedure, a History and Physical                            was performed, and patient medications and                            allergies were reviewed. The patient is competent.                            The risks and benefits of the procedure and the                            sedation options and risks were discussed with the                            patient. All questions were answered and informed                            consent was obtained. Patient identification and                            proposed procedure were verified by the physician.                            Mental Status Examination: alert and oriented.                            Airway Examination: normal oropharyngeal airway and  neck mobility. Respiratory Examination: clear to                            auscultation. CV Examination: normal. Prophylactic                            Antibiotics: The patient does not require                            prophylactic antibiotics. Prior Anticoagulants: The                            patient has taken no previous anticoagulant or     antiplatelet agents. ASA Grade Assessment: III - A                            patient with severe systemic disease. After                            reviewing the risks and benefits, the patient was                            deemed in satisfactory condition to undergo the                            procedure. The anesthesia plan was to use general                            anesthesia. Immediately prior to administration of                            medications, the patient was re-assessed for                            adequacy to receive sedatives. The heart rate,                            respiratory rate, oxygen saturations, blood                            pressure, adequacy of pulmonary ventilation, and                            response to care were monitored throughout the                            procedure. The physical status of the patient was                            re-assessed after the procedure.                           After obtaining informed consent, the scope was  passed under direct vision. Throughout the                            procedure, the patient's blood pressure, pulse, and                            oxygen saturations were monitored continuously. The                            [id number]) scope was introduced through                            the mouth, and used to inject contrast into and                            used to inject contrast into the bile duct and                            ventral pancreatic duct. The ERCP was accomplished                            without difficulty. The patient tolerated the                            procedure well. Scope In: Scope Out: Findings:      The esophagus was successfully intubated under direct vision. The scope       was advanced to a normal major papilla in the descending duodenum       without detailed examination of the pharynx, larynx and associated        structures, and upper GI tract. The upper GI tract was grossly normal.       The bile duct was deeply cannulated with the traction (standard)       sphincterotome. Contrast was injected. I personally interpreted the bile       duct and pancreatic duct images.The pancreatic duct was normal.       Opacification of the entire biliary tree except for the gallbladder was       successful after deep cannulation with the catheter. The lower third of       the main bile duct contained one stone, which was 5 mm in diameter. A       cholecystectomy had been performed. A 0.035 inch straight standard wire       was passed into the biliary tree. Biliary sphincterotomy was made with a       traction (standard) sphincterotome using ERBE electrocautery. There was       no post-sphincterotomy bleeding. The biliary tree was swept with a 12 mm       balloon starting at the bifurcation. All stones were removed. No stones       remained. Impression:               - The patient has had a cholecystectomy.                           - Choledocholithiasis was found. Complete removal  was accomplished by biliary sphincterotomy and                            balloon extraction.                           - A biliary sphincterotomy was performed.                           - The biliary tree was swept. Recommendation:           - Clear liquid diet.                           - Indomethacin suppositories                           - Inpatient GI team to follow up on patient                           - Case discussed with family and procedure report                            provided to them Procedure Code(s):        --- Professional ---                           [phone], Endoscopic retrograde                            cholangiopancreatography (ERCP); with removal of                            calculi/debris from biliary/pancreatic duct(s)                           43262, Endoscopic retrograde                             cholangiopancreatography (ERCP); with                            sphincterotomy/papillotomy Diagnosis Code(s):        --- Professional ---                           Z90.49, Acquired absence of other specified parts                            of digestive tract                           K80.50, Calculus of bile duct without cholangitis                            or cholecystitis without obstruction                           K83.3,  Fistula of bile duct CPT copyright 2016 American Medical Association. All rights reserved. The codes documented in this report are preliminary and upon coder review may  be revised to meet current compliance requirements. Docia Chuck. Henrene Pastor, MD 01/17/2016 2:08:34 PM This report has been signed electronically. Number of Addenda: 0

## 2016-01-17 NOTE — Progress Notes (Signed)
[facility] Progress Note Day of Surgery  Subjective: Patient back from endoscopy wherer they did remove a CBD stone.  Having some right sided pain now.  Objective: Vital signs in last 24 hours: Temp:  [98.8 F (37.1 C)-99.7 F (37.6 C)] 98.8 F (37.1 C) (12/15 1413) Pulse Rate:  [70-80] 74 (12/15 1433) Resp:  [11-23] 22 (12/15 1445) BP: (128-168)/(66-87) 128/87 (12/15 1433) SpO2:  [93 %-100 %] 96 % (12/15 1445) Weight:  [99.7 kg (219 lb 12.8 oz)] 99.7 kg (219 lb 12.8 oz) (12/15 0439) Last BM Date: 01/11/16  Intake/Output from previous day: 12/14 0701 - 12/15 0700 In: 1910 [P.O.:1560; I.V.:200; IV Piggyback:150] Out: [id number] [Urine:1700; Drains:110] Intake/Output this shift: Total I/O In: 800 [I.V.:800] Out: 202 [Urine:200; Blood:2]  General: Mild acute distress with pain on her right side.  Lungs: Clear  Abd: Soft, good bowel sounds.  Blake drain with serosanguinous fluid  Extremities: No changes  Neuro: Intact  Lab Results:  @LABLAST2 (wbc:2,hgb:2,hct:2,plt:2) BMET ) Recent Labs  01/16/16 0917 01/17/16 0624  NA 141 142  K 4.0 3.6  CL 108 107  CO2 23 27  GLUCOSE 131* 131*  BUN 11 7  CREATININE 0.84 0.79  CALCIUM 9.4 9.6   PT/INR No results for input(s): LABPROT, INR in the last 72 hours. ABG No results for input(s): PHART, HCO3 in the last 72 hours.  Invalid input(s): PCO2, PO2  Studies/Results: Dg Abd Portable 1v  Result Date: 01/16/2016 CLINICAL DATA:  Ileus following gastrointestinal surgery [id number] EXAM: PORTABLE ABDOMEN - 1 VIEW COMPARISON:  None. FINDINGS: Bowel gas pattern is nonobstructed. Gas is present throughout nondilated loops of large and small bowel. Surgical clips are noted in the right upper quadrant the abdomen. No abnormal calcifications. No evidence for organomegaly. IMPRESSION: No evidence for acute  abnormality. Electronically Signed   By: Nolon Nations M.D.   On: 01/16/2016 14:29    Anti-infectives: Anti-infectives    Start      Dose/Rate Route Frequency Ordered Stop   01/15/16 1415  piperacillin-tazobactam (ZOSYN) IVPB 3.375 g  Status:  Discontinued     3.375 g 100 mL/hr over 30 Minutes Intravenous STAT 01/15/16 1404 01/15/16 1634   01/14/16 1200  piperacillin-tazobactam (ZOSYN) IVPB 3.375 g     3.375 g 12.5 mL/hr over 240 Minutes Intravenous Every 8 hours 01/14/16 1133     01/13/16 1500  cefTRIAXone (ROCEPHIN) 2 g in dextrose 5 % 50 mL IVPB  Status:  Discontinued     2 g 100 mL/hr over 30 Minutes Intravenous Every 24 hours 01/13/16 1424 01/14/16 1132      Assessment/Plan: s/p Procedure(s): ENDOSCOPIC RETROGRADE CHOLANGIOPANCREATOGRAPHY (ERCP) Advance diet Plan for discharge tomorrow  LOS: 4 days   Kathryne Eriksson. Dahlia Bailiff, MD, FACS [phone] [facility] 01/17/2016

## 2016-01-17 NOTE — Interval H&P Note (Signed)
History and Physical Interval Note:  01/17/2016 12:16 PM  Caitlyn Aguirre  has presented today for surgery, with the diagnosis of Choledocholithiasis on IOC  The various methods of treatment have been discussed with the patient and family. After consideration of risks, benefits and other options for treatment, the patient has consented to  Procedure(s): ENDOSCOPIC RETROGRADE CHOLANGIOPANCREATOGRAPHY (ERCP) (N/A) as a surgical intervention .  The patient's history has been reviewed, patient examined, no change in status, stable for surgery.  I have reviewed the patient's chart and labs.  Questions were answered to the patient's satisfaction.     [name]

## 2016-01-17 NOTE — Anesthesia Postprocedure Evaluation (Signed)
Anesthesia Post Note  Patient: Caitlyn Aguirre  Procedure(s) Performed: Procedure(s) (LRB): ENDOSCOPIC RETROGRADE CHOLANGIOPANCREATOGRAPHY (ERCP) (N/A)  Patient location during evaluation: PACU Anesthesia Type: General Level of consciousness: awake and alert Pain management: pain level controlled Vital Signs Assessment: post-procedure vital signs reviewed and stable Respiratory status: spontaneous breathing, nonlabored ventilation, respiratory function stable and patient connected to nasal cannula oxygen Cardiovascular status: blood pressure returned to baseline and stable Postop Assessment: no signs of nausea or vomiting Anesthetic complications: no    Last Vitals:  Vitals:   01/17/16 1433 01/17/16 1445  BP: 128/87   Pulse: 74   Resp: (!) 21 (!) 22  Temp:      Last Pain:  Vitals:   01/17/16 1629  TempSrc:   PainSc: 2                  Effie Berkshire

## 2016-01-17 NOTE — Discharge Instructions (Signed)
Your appointment is at (see above), please arrive at least 30 min before your appointment to complete your check in paperwork.  If you are unable to arrive 30 min prior to your appointment time we may have to cancel or reschedule you.  LAPAROSCOPIC SURGERY: POST OP INSTRUCTIONS  1. DIET: Follow a light bland diet the first 24 hours after arrival home, such as soup, liquids, crackers, etc. Be sure to include lots of fluids daily. Avoid fast food or heavy meals as your are more likely to get nauseated. Eat a low fat the next few days after surgery.  2. Take your usually prescribed home medications unless otherwise directed. 3. PAIN CONTROL:  1. Pain is best controlled by a usual combination of three different methods TOGETHER:  1. Ice/Heat 2. Over the counter pain medication 3. Prescription pain medication 2. Most patients will experience some swelling and bruising around the incisions. Ice packs or heating pads (30-60 minutes up to 6 times a day) will help. Use ice for the first few days to help decrease swelling and bruising, then switch to heat to help relax tight/sore spots and speed recovery. Some people prefer to use ice alone, heat alone, alternating between ice & heat. Experiment to what works for you. Swelling and bruising can take several weeks to resolve.  3. It is helpful to take an over-the-counter pain medication regularly for the first few weeks. Choose one of the following that works best for you:  1. Naproxen (Aleve, etc) Two 220mg  tabs twice a day 2. Ibuprofen (Advil, etc) Three 200mg  tabs four times a day (every meal & bedtime) 3. Acetaminophen (Tylenol, etc) 500-650mg  four times a day (every meal & bedtime) 4. A prescription for pain medication (such as oxycodone, hydrocodone, etc) should be given to you upon discharge. Take your pain medication as prescribed.  1. If you are having problems/concerns with the prescription medicine (does not control pain, nausea, vomiting, rash,  itching, etc), please call us [phone] to see if we need to switch you to a different pain medicine that will work better for you and/or control your side effect better. 2. If you need a refill on your pain medication, please contact your pharmacy. They will contact our office to request authorization. Prescriptions will not be filled after 5 pm or on week-ends. 4. Avoid getting constipated. Between the surgery and the pain medications, it is common to experience some constipation. Increasing fluid intake and taking a fiber supplement (such as Metamucil, Citrucel, FiberCon, MiraLax, etc) 1-2 times a day regularly will usually help prevent this problem from occurring. A mild laxative (prune juice, Milk of Magnesia, MiraLax, etc) should be taken according to package directions if there are no bowel movements after 48 hours.  5. Watch out for diarrhea. If you have many loose bowel movements, simplify your diet to bland foods & liquids for a few days. Stop any stool softeners and decrease your fiber supplement. Switching to mild anti-diarrheal medications (Kayopectate, Pepto Bismol) can help. If this worsens or does not improve, please call us. 6. Wash / shower every day. You may shower over the dressings as they are waterproof. Continue to shower over incision(s) after the dressing is off. 7. Remove your waterproof bandages 5 days after surgery. You may leave the incision open to air. You may replace a dressing/Band-Aid to cover the incision for comfort if you wish.  8. ACTIVITIES as tolerated:  1. You may resume regular (light) daily activities beginning the next day--such  as daily self-care, walking, climbing stairs--gradually increasing activities as tolerated. If you can walk 30 minutes without difficulty, it is safe to try more intense activity such as jogging, treadmill, bicycling, low-impact aerobics, swimming, etc. 2. Save the most intensive and strenuous activity for last such as sit-ups, heavy  lifting, contact sports, etc Refrain from any heavy lifting or straining until you are off narcotics for pain control.  3. DO NOT PUSH THROUGH PAIN. Let pain be your guide: If it hurts to do something, don't do it. Pain is your body warning you to avoid that activity for another week until the pain goes down. 4. You may drive when you are no longer taking prescription pain medication, you can comfortably wear a seatbelt, and you can safely maneuver your car and apply brakes. 5. You may have sexual intercourse when it is comfortable.  9. FOLLOW UP in our office  1. Please call [facility] at [phone] to set up an appointment to see your surgeon in the office for a follow-up appointment approximately 2-3 weeks after your surgery. 2. Make sure that you call for this appointment the day you arrive home to insure a convenient appointment time.      10. IF YOU HAVE DISABILITY OR FAMILY LEAVE FORMS, BRING THEM TO THE               OFFICE FOR PROCESSING.   WHEN TO CALL us [phone]:  1. Poor pain control 2. Reactions / problems with new medications (rash/itching, nausea, etc)  3. Fever over 101.5 F (38.5 C) 4. Inability to urinate 5. Nausea and/or vomiting 6. Worsening swelling or bruising 7. Continued bleeding from incision. 8. Increased pain, redness, or drainage from the incision  The clinic staff is available to answer your questions during regular business hours (8:30am-5pm). Please dont hesitate to call and ask to speak to one of our nurses for clinical concerns.  If you have a medical emergency, go to the nearest emergency room or call 911.  A surgeon from [facility] is always on call at the [facility], [place], [place], [place], [place] [address] ?  MAIN: [phone] ? TOLL FREE: [phone] ?  FAX [phone]) [phone]  [email or web address]

## 2016-01-17 NOTE — Transfer of Care (Signed)
Immediate Anesthesia Transfer of Care Note  Patient: Caitlyn Aguirre  Procedure(s) Performed: Procedure(s): ENDOSCOPIC RETROGRADE CHOLANGIOPANCREATOGRAPHY (ERCP) (N/A)  Patient Location: Endoscopy Unit  Anesthesia Type:General  Level of Consciousness: awake, alert  and oriented  Airway & Oxygen Therapy: Patient Spontanous Breathing and Patient connected to face mask oxygen  Post-op Assessment: Report given to RN, Post -op Vital signs reviewed and stable and Patient moving all extremities X 4  Post vital signs: Reviewed and stable  Last Vitals:  Vitals:   01/17/16 1205 01/17/16 1413  BP: (!) 168/76 (!) 156/66  Pulse: 71 76  Resp: 11 (!) 23  Temp:  37.1 C    Last Pain:  Vitals:   01/17/16 1413  TempSrc: Oral  PainSc:          Complications: No apparent anesthesia complications

## 2016-01-17 NOTE — Progress Notes (Signed)
LFTs improved.  Lipase normal.   She feels better.  Still with post op pain upper abdomen but no epigastric bloating.  No nausea.  No SOB.   Vital signs stable.  No fevers.  KUB unremarkable.  Scheduled Meds: . amLODipine  5 mg Oral Daily  . aspirin EC  81 mg Oral Daily  . carvedilol  25 mg Oral BID WC  . insulin aspart  0-9 Units Subcutaneous Q4H  . levothyroxine  88 mcg Oral QAC breakfast  . lisinopril  20 mg Oral Daily  . mometasone-formoterol  2 puff Inhalation BID  . pantoprazole  40 mg Oral Daily  . PARoxetine  30 mg Oral Daily  . piperacillin-tazobactam (ZOSYN)  IV  3.375 g Intravenous Q8H  . tiotropium  18 mcg Inhalation Daily   Continuous Infusions: . lactated ringers 10 mL/hr at 01/15/16 1215   PRN Meds:.albuterol, HYDROmorphone (DILAUDID) injection, ondansetron (ZOFRAN) IV   *  2 days post lap chole  *  choledocholithiasis on IOC.    For ERCP today with Dr Henrene Pastor 1300 today.     Azucena Freed  PA-C   [phone].

## 2016-01-17 NOTE — Progress Notes (Signed)
   Subjective:  No acute events overnight. Some nausea but no emesis. Says the medicine helps. Pain is well controlled. Drain is in place with minimal serosanguinous output. Ready for ERCP this morning, She had no further questions.   Objective:  Vital signs in last 24 hours: Vitals:   01/16/16 0629 01/16/16 1345 01/16/16 2001 01/17/16 0439  BP: (!) 147/68 127/69 139/76 (!) 161/72  Pulse: 76 78 77 80  Resp: 18 18 18 18   Temp: 97.9 F (36.6 C) 97.8 F (36.6 C) 99 F (37.2 C) 99.7 F (37.6 C)  TempSrc: Oral Oral Oral Oral  SpO2: 94% 95% 97% 96%  Weight:    219 lb 12.8 oz (99.7 kg)   Physical Exam  Constitutional: She is oriented to person, place, and time. She appears well-developed and well-nourished.  HENT:  Head: Normocephalic and atraumatic.  Cardiovascular: Normal rate and regular rhythm.  Exam reveals no gallop and no friction rub.   No murmur heard. Respiratory: Effort normal and breath sounds normal. No respiratory distress.  No Wheezing   GI: Soft. Bowel sounds are normal. There is tenderness.  Tenderness near surgical incisions. RUQ abdominal pain improving. JP tube on right side with small amount of serosanguinous fluid  Musculoskeletal: She exhibits no edema.  Neurological: She is alert and oriented to person, place, and time.     Assessment/Plan: Mrs. Lisanti is a 80 year old female with multiple comorbidities presenting with right upper quadrant abdominal pain, leukocytosis and ultrasound demonstrating pericholecystic fluid consistent with the diagnosis of acute cholecystitis.   In summary, KUB w/o acute abnormality. LFT's improving. Will have ERCP today @ 1300 2/2 choledocholithiasis on intra-operative cholangiogram. Cr improved. I have re-started her home lisinopril.   1. Acute vs chronic cholecystitis S/p laparoscopic cholecystectomy. POD #2. Afebrile and HDS. Pain well controlled. JP output minimal. Intra-operative cholangiogram showing choledocholithiasis  prompting ERCP today.  - NPO - Zosyn per pharmacy  - Hydromorphone .5-1mg  q2 hrs prn pain  - Appreciate surgery and GI's consultation on this case  -- Lipase 11 wnl  2. DM -- Continue Insulin Aspart- glucose doing well given recent operation   3. Hypothyroidism  -- Continue Levothyroxine   4. HTN and Hx of diastolic CHF Currently hypertensive with most recent BP of 161/72 -- Amlodipine 5mg  once daily -- Carvedilol 25mg  bid -- Lisinopril 20mg  once daily  5. COPD -- Dulera -- Spiriva -- Home 1 - 2 liters oxygen -- Albuterol 2.5 q6 hours prn wheezing   6. AKI, Resolved  Cr of 1.09 on admission, at baseline around .80. Most likely 2/2 decreased PO intake associated with n/v. Cr improved to 0.79 after IVFs. - Resume Lisinopril today- 20mg  oral once daily  7. Depression -- Paroxetine 30mg    DVT/PE prophylaxis: Lovenox FEN/GI: NPO CODE: Full code   Dispo: Anticipated discharge pending intervention and recovery.   Ophelia Shoulder, MD 01/17/2016, 8:04 AM Pager: [phone]

## 2016-01-17 NOTE — H&P (View-Only) (Signed)
LFTs improved.  Lipase normal.   She feels better.  Still with post op pain upper abdomen but no epigastric bloating.  No nausea.  No SOB.   Vital signs stable.  No fevers.  KUB unremarkable.  Scheduled Meds: . amLODipine  5 mg Oral Daily  . aspirin EC  81 mg Oral Daily  . carvedilol  25 mg Oral BID WC  . insulin aspart  0-9 Units Subcutaneous Q4H  . levothyroxine  88 mcg Oral QAC breakfast  . lisinopril  20 mg Oral Daily  . mometasone-formoterol  2 puff Inhalation BID  . pantoprazole  40 mg Oral Daily  . PARoxetine  30 mg Oral Daily  . piperacillin-tazobactam (ZOSYN)  IV  3.375 g Intravenous Q8H  . tiotropium  18 mcg Inhalation Daily   Continuous Infusions: . lactated ringers 10 mL/hr at 01/15/16 1215   PRN Meds:.albuterol, HYDROmorphone (DILAUDID) injection, ondansetron (ZOFRAN) IV   *  2 days post lap chole  *  choledocholithiasis on IOC.    For ERCP today with Dr Henrene Pastor 1300 today.     Azucena Freed  PA-C   [phone].

## 2016-01-17 NOTE — Anesthesia Preprocedure Evaluation (Addendum)
Anesthesia Evaluation  Patient identified by MRN, date of birth, ID band Patient awake    Reviewed: Allergy & Precautions, NPO status , Patient's Chart, lab work & pertinent test results, reviewed documented beta blocker date and time   Airway Mallampati: II       Dental  (+) Dental Advisory Given, Edentulous Lower, Edentulous Upper   Pulmonary sleep apnea , COPD, former smoker,    breath sounds clear to auscultation       Cardiovascular hypertension, Pt. on medications and Pt. on home beta blockers + Past MI and +CHF   Rhythm:Regular Rate:Normal     Neuro/Psych PSYCHIATRIC DISORDERS Depression CVA    GI/Hepatic GERD  Medicated,  Endo/Other  diabetes, Type 2, Oral Hypoglycemic AgentsHypothyroidism   Renal/GU   negative genitourinary   Musculoskeletal  (+) Arthritis , Osteoarthritis,    Abdominal   Peds negative pediatric ROS (+)  Hematology   Anesthesia Other Findings   Reproductive/Obstetrics                            Anesthesia Physical Anesthesia Plan  ASA: III  Anesthesia Plan: General   Post-op Pain Management:    Induction: Intravenous  Airway Management Planned: Oral ETT  Additional Equipment:   Intra-op Plan:   Post-operative Plan: Extubation in OR  Informed Consent: I have reviewed the patients History and Physical, chart, labs and discussed the procedure including the risks, benefits and alternatives for the proposed anesthesia with the patient or authorized representative who has indicated his/her understanding and acceptance.   Dental advisory given  Plan Discussed with: CRNA  Anesthesia Plan Comments:         Anesthesia Quick Evaluation

## 2016-01-18 DIAGNOSIS — K8001 Calculus of gallbladder with acute cholecystitis with obstruction: Secondary | ICD-10-CM

## 2016-01-18 LAB — COMPREHENSIVE METABOLIC PANEL
ALT: 46 U/L (ref 14–54)
AST: 28 U/L (ref 15–41)
Albumin: 2.4 g/dL — ABNORMAL LOW (ref 3.5–5.0)
Alkaline Phosphatase: 134 U/L — ABNORMAL HIGH (ref 38–126)
Anion gap: 9 (ref 5–15)
BUN: 7 mg/dL (ref 6–20)
CO2: 27 mmol/L (ref 22–32)
Calcium: 9.5 mg/dL (ref 8.9–10.3)
Chloride: 108 mmol/L (ref 101–111)
Creatinine, Ser: 0.78 mg/dL (ref 0.44–1.00)
GFR calc Af Amer: >60 mL/min (ref >60)
GFR calc non Af Amer: >60 mL/min (ref >60)
Glucose, Bld: 104 mg/dL — ABNORMAL HIGH (ref 65–99)
Potassium: 3.9 mmol/L (ref 3.5–5.1)
Sodium: 144 mmol/L (ref 135–145)
Total Bilirubin: 1.1 mg/dL (ref 0.3–1.2)
Total Protein: 5.4 g/dL — ABNORMAL LOW (ref 6.5–8.1)

## 2016-01-18 LAB — GLUCOSE, CAPILLARY
Glucose-Capillary: 103 mg/dL — ABNORMAL HIGH (ref 65–99)
Glucose-Capillary: 107 mg/dL — ABNORMAL HIGH (ref 65–99)

## 2016-01-18 MED ORDER — HYDROCODONE-ACETAMINOPHEN 5-325 MG PO TABS: 1.0000 | ORAL_TABLET | Freq: Four times a day (QID) | ORAL | 0 refills | Status: DC | PRN

## 2016-01-18 NOTE — Progress Notes (Signed)
   Patient Name: Caitlyn Aguirre Date of Encounter: 01/18/2016, 9:24 AM    Subjective  S/p ERCP with stone extraction yesterday. Patient feeling well, sitting in chair. Denies any abdominal pain, nausea or vomiting. She was eating regular breakfast this morning   Objective  BP (!) 152/63 (BP Location: Right Arm)   Pulse 62   Temp 98.5 F (36.9 C) (Oral)   Resp 18   Wt 218 lb 6.4 oz (99.1 kg)   SpO2 97%   BMI 33.21 kg/m  General:   no acute distress Eyes:  anicteric. Lungs: Clear to auscultation bilaterally. Heart:    S1S2,  Abdomen:  soft, non-tender and BS+.  Extremities:   no edema Neuro:  A&O x 3.  Psych:  appropriate mood and  Affect.    Assessment and Plan  24 yr F with Cholelithiasis, acute cholecystitis status post cholecystectomy with evidence of choledocholithiasis on IOC, status post ERCP yesterday with stone extraction from CBD. Patient is doing well with no complaints this morning LFT trended down to normal range Patient was eating a regular diet with sausage and eggs for breakfast this morning. Advised her to avoid high fat diet for next few weeks. Slowly advance diet as tolerated Follow-up with surgery post cholecystectomy We will sign off, please call with any questions    K. Denzil Magnuson , MD [phone] Mon-Fri 8a-5p [phone] after 5p, weekends, holidays

## 2016-01-18 NOTE — Discharge Summary (Signed)
Patient ID: Caitlyn Aguirre [id number] 80 y.o. 03/02/1935  Admit date: 01/13/2016  Discharge date and time: No discharge date for patient encounter.  Admitting Physician: Lucious Groves  Discharge Physician: Adin Hector  Admission Diagnoses: Cholecysitis Chronic cholecystitis and cholecystitis Choledocholithiasis on IOC  Discharge Diagnoses: Cholecystitis with cholelithiasis                                         Choledocholithiasis                                          Diabetes mellitus                                                                Diastolic congestive heart failure                                         COPD                                         Obstructive sleep apnea    Operations: Procedure(s): ENDOSCOPIC RETROGRADE CHOLANGIOPANCREATOGRAPHY (ERCP)  laparoscopic cholecystectomy with cholangiogram  Admission Condition: fair  Discharged Condition: good  Indication for Admission: Patient is a 80 year old female with a prior history of CAD, CHF, COPD on home oxygen, DM, HTN, hypothyroidism who was sent to [facility] via the internal [place] for abdominal pain. Patient states she's been experiencing intermittent, progressively worsening, sharp, severe abdominal pain since Thursday. Patient states the pain initially started substernal and then moved into her mid abdomen and then into her right side. The pain remains in her RUQ and is non radiating. She states holding her side makes it better. She tried Advil without relief. She had an episode of emesis on Saturday night. Associated symptoms include nausea, fever, chills, shortness of breath, diaphoresis. Patient denies chest pain, numbness, weakness. Abdominal ultrasound revealed acute cholecystitis. WBC of 19.8.   Pt not anticoagulated and last PO intake was orange juice this morning. Uses home O2 only with activities and not at rest.  Hospital Course: The patient was admitted, placed on  bowel rest, antibiotics, pain control, and her usual medications.  She was seen by cardiology who felt that her risk assessment was acceptable for cholecystectomy.  HIDA scan consistent with chronic cholecystitis.        on 01/15/2016 she underwent laparoscopic cholecystectomy with cholangiogram.  The gallbladder was found to be chronically inflamed.  Cholangiogram showed choledocholithiasis.  The surgery was otherwise uneventful.    The patient remained stable postop.  On 01/17/2016 she underwent ERCP with sphincterotomy and common duct stone extraction by Dr. [name].     The day following the ERCP the patient felt well and was asking to go home.  JP drainage was serosanguineous and the drain was removed.  The abdomen was soft and nontender.  She was alert  with normal mental status.      She was given instructions in diet and activities.  She was given a prescription for hydrocodone for pain.  She was asked to return to see Dr. Hulen Skains in the office in about 2 weeks.       Consults: Internal medicine, surgery, cardiology.  Significant Diagnostic Studies: Ultrasound, nuclear medicine scan, cholangiogram, blood work   Treatments: surgery: Laparoscopic cholecystectomy with cholangiogram, ERCP with stone extraction.  Antibiotics   Disposition: Home  Patient Instructions:  Allergies as of 01/18/2016   No Known Allergies     Medication List    TAKE these medications   ACCU-CHEK FASTCLIX LANCETS Misc Use as directed   ACCU-CHEK GUIDE w/Device Kit 1 each by Does not apply route as needed.   albuterol 108 (90 Base) MCG/ACT inhaler Commonly known as:  PROVENTIL HFA;VENTOLIN HFA Inhale 1-2 puffs into the lungs every 6 (six) hours as needed for wheezing or shortness of breath.   amLODipine 5 MG tablet Commonly known as:  NORVASC Take 1 tablet (5 mg total) by mouth daily.   aspirin EC 81 MG tablet Take 1 tablet (81 mg total) by mouth daily.   atorvastatin 40 MG tablet Commonly known  as:  LIPITOR Take 1 tablet by mouth every evening at 6PM What changed:  how much to take  how to take this  when to take this  additional instructions   carvedilol 25 MG tablet Commonly known as:  COREG Take 1 tablet (25 mg total) by mouth 2 (two) times daily with a meal.   Fluticasone-Salmeterol 250-50 MCG/DOSE Aepb Commonly known as:  ADVAIR DISKUS Inhale 1 puff into the lungs 2 (two) times daily.   glucose blood test strip Commonly known as:  ACCU-CHEK GUIDE Use as instructed   HYDROcodone-acetaminophen 5-325 MG tablet Commonly known as:  NORCO Take 1-2 tablets by mouth every 6 (six) hours as needed for moderate pain or severe pain.   levothyroxine 88 MCG tablet Commonly known as:  SYNTHROID, LEVOTHROID Take 1 tablet by mouth every morning 30 minutes before other medications and food. Take with water only. What changed:  how much to take  how to take this  when to take this  additional instructions   lisinopril 20 MG tablet Commonly known as:  PRINIVIL,ZESTRIL Take 1 tablet (20 mg total) by mouth daily.   metFORMIN 1000 MG tablet Commonly known as:  GLUCOPHAGE Take 1 tablet (1,000 mg total) by mouth 2 (two) times daily with a meal.   omeprazole 20 MG capsule Commonly known as:  PRILOSEC Take 1 capsule (20 mg total) by mouth daily with supper.   omeprazole 20 MG capsule Commonly known as:  PRILOSEC TAKE 1 CAPSULE BY MOUTH EVERY DAY   ondansetron 4 MG tablet Commonly known as:  ZOFRAN Take 1 tablet (4 mg total) by mouth every 6 (six) hours as needed for nausea or vomiting.   OXYGEN Inhale into the lungs at bedtime as needed (for breathing).   PARoxetine 30 MG tablet Commonly known as:  PAXIL Take 1 tablet (30 mg total) by mouth daily.   tiotropium 18 MCG inhalation capsule Commonly known as:  SPIRIVA HANDIHALER INHALE THE CONTENTS OF 1 CAPSULE VIA HANDIHALER BY MOUTH EVERY DAY What changed:  how much to take  how to take this  when to take  this  additional instructions       Activity: No sports or heavy lifting Diet: low fat, low cholesterol diet Wound Care: none needed  Follow-up:  With Dr. Veryl Speak 2 weeks.  Signed: Edsel Petrin. Dalbert Batman, M.D., FACS General and minimally invasive surgery Breast and Colorectal Surgery  01/18/2016, 10:56 AM

## 2016-01-18 NOTE — Progress Notes (Signed)
   Subjective:  No acute events overnight. No N/V, mild abd pain to palpation. Tolerated soft diet, passing stool and gas. Excited for DC.  Objective:  Vital signs in last 24 hours: Vitals:   01/17/16 2105 01/17/16 2201 01/18/16 0121 01/18/16 0416  BP: (!) 133/59  126/67 (!) 152/63  Pulse:  (!) 59 (!) 55 62  Resp: 19 18 19 18   Temp: 98.6 F (37 C)  98 F (36.7 C) 98.5 F (36.9 C)  TempSrc: Oral  Oral Oral  SpO2: 95% 96% 98% 97%  Weight:    218 lb 6.4 oz (99.1 kg)   Physical Exam  Constitutional: She is oriented to person, place, and time. She appears well-developed and well-nourished.  HENT:  Head: Normocephalic and atraumatic.  Cardiovascular: Normal rate and regular rhythm.  Exam reveals no gallop and no friction rub.   No murmur heard. Respiratory: Effort normal and breath sounds normal. No respiratory distress.  No Wheezing   GI: Soft. Bowel sounds are normal. There is tenderness (mild).  Tenderness near surgical incisions. RUQ abdominal pain improving. JP tube on right side with small amount of serosanguinous fluid  Musculoskeletal: She exhibits no edema.  Neurological: She is alert and oriented to person, place, and time.   Assessment/Plan: Caitlyn Aguirre is a 80 year old female with multiple comorbidities presenting with right upper quadrant abdominal pain, leukocytosis and ultrasound demonstrating pericholecystic fluid consistent with the diagnosis of acute cholecystitis.   In summary, now s/p cholecystectomy and ERCP w/ stone removal. Tolerating diet and passing stool/gas. Ready for DC.  1. Acute vs chronic cholecystitis S/p laparoscopic cholecystectomy. POD #3. Afebrile and HDS. Pain well controlled. JP output minimal serosanguinous. S/p ERCP w/ stone removal.  - ADAT - Zosyn per pharmacy  - Hydromorphone .5-1mg  q2 hrs prn pain   2. DM -- Continue Insulin Aspart- glucose doing well given recent operation   3. Hypothyroidism  -- Continue Levothyroxine   4.  HTN and Hx of diastolic CHF Currently hypertensive with most recent BP of 152/63 -- Amlodipine 5mg  once daily -- Carvedilol 25mg  bid -- Lisinopril 20mg  once daily  5. COPD -- Dulera -- Spiriva -- Home 1 - 2 liters oxygen -- Albuterol 2.5 q6 hours prn wheezing   6. AKI, Resolved   7. Depression -- Paroxetine 30mg    DVT/PE prophylaxis: Lovenox FEN/GI: NPO CODE: Full code   Dispo: Anticipated discharge today.   Holley Raring, MD 01/18/2016, 9:05 AM Pager: [phone]

## 2016-01-20 ENCOUNTER — Encounter (HOSPITAL_COMMUNITY): Payer: Self-pay | Admitting: Internal Medicine

## 2016-01-20 ENCOUNTER — Telehealth: Payer: Self-pay

## 2016-01-20 NOTE — Telephone Encounter (Signed)
I tried returning. No answer and mailbox is full. If she her daughter calls back please let her know I saw the message. The key is to avoid fatty foods. I anticipate the pain will improve slowly over the next few days. She may take two hydrocodone tabs once a day when the pain is the worst. She needs a follow up appointment in [facility] for later this week so we can check on her. Please try to arrange for Friday morning, I will be precepting. Any other day is ok, and I will try to be available to say hi to her.

## 2016-01-20 NOTE — Telephone Encounter (Signed)
Daughter called to report that her mother is complaining that Hydrocodone pills are not helping her pain and they are keeping her awake requsting change to something else.

## 2016-01-21 NOTE — Telephone Encounter (Signed)
Spoke with daughter advised her MD said to avoid fatty foods and take 2 hydrocodone daily when pain is worse, MD suspects pain will lessen over the next few days and to come for appoinment. Daughter reports she has an appointment for this Friday

## 2016-01-23 ENCOUNTER — Telehealth: Payer: Self-pay | Admitting: Student in an Organized Health Care Education/Training Program

## 2016-01-23 NOTE — Telephone Encounter (Signed)
APT. REMINDER CALL, NO ANSWER, MAILBOX FULL °

## 2016-01-24 ENCOUNTER — Ambulatory Visit (INDEPENDENT_AMBULATORY_CARE_PROVIDER_SITE_OTHER): Payer: Medicare Other | Admitting: Internal Medicine

## 2016-01-24 VITALS — BP 160/75 | HR 76 | Temp 98.3°F | Ht 68.0 in | Wt 202.5 lb

## 2016-01-24 DIAGNOSIS — Z09 Encounter for follow-up examination after completed treatment for conditions other than malignant neoplasm: Secondary | ICD-10-CM | POA: Diagnosis not present

## 2016-01-24 DIAGNOSIS — G4700 Insomnia, unspecified: Secondary | ICD-10-CM | POA: Diagnosis not present

## 2016-01-24 DIAGNOSIS — Z9049 Acquired absence of other specified parts of digestive tract: Secondary | ICD-10-CM | POA: Diagnosis not present

## 2016-01-24 NOTE — Assessment & Plan Note (Signed)
Patient feels much better after her surgery. She has some mild continued right-sided abdominal pain, but says her pain is very well controlled. She is taking Norco 5-325 mg every 6 hours as needed with relief. She does feel that the Norco is keeping her awake at night. She is eating well and avoiding fatty and fried foods and having good bowel movements. She denies any fevers, chills, nausea, vomiting, or diarrhea.  Patient appears much improved since her lap chole and ERCP per her report. She is eating well with only mild intermittent abdominal pain which is otherwise well-controlled. She is afebrile today. Patient counseled on dietary changes and advised to follow up with surgery as planned in about 2 weeks.
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
## 2016-01-24 NOTE — Patient Instructions (Signed)
It was a pleasure to meet you Caitlyn Aguirre.  I am glad you are feeling better!  Please try avoiding computers, TV, cell phones at least an hour before bedtime and try some of the things we talked about to help you sleep at night.  You can try Melatonin again for sleep aid. Try taking it around sunset.  Please follow up with the surgeons as scheduled and with Dr. Evette Doffing.  Come see [place] sooner if you begin to have fevers, chills, nausea, vomiting, or worsened stomach pain.   Insomnia Insomnia is a sleep disorder that makes it difficult to fall asleep or to stay asleep. Insomnia can cause tiredness (fatigue), low energy, difficulty concentrating, mood swings, and poor performance at work or school. There are three different ways to classify insomnia:  Difficulty falling asleep.  Difficulty staying asleep.  Waking up too early in the morning. Any type of insomnia can be long-term (chronic) or short-term (acute). Both are common. Short-term insomnia usually lasts for three months or less. Chronic insomnia occurs at least three times a week for longer than three months. What are the causes? Insomnia may be caused by another condition, situation, or substance, such as:  Anxiety.  Certain medicines.  Gastroesophageal reflux disease (GERD) or other gastrointestinal conditions.  Asthma or other breathing conditions.  Restless legs syndrome, sleep apnea, or other sleep disorders.  Chronic pain.  Menopause. This may include hot flashes.  Stroke.  Abuse of alcohol, tobacco, or illegal drugs.  Depression.  Caffeine.  Neurological disorders, such as Alzheimer disease.  An overactive thyroid (hyperthyroidism). The cause of insomnia may not be known. What increases the risk? Risk factors for insomnia include:  Gender. Women are more commonly affected than men.  Age. Insomnia is more common as you get older.  Stress. This may involve your professional or personal life.  Income.  Insomnia is more common in people with lower income.  Lack of exercise.  Irregular work schedule or night shifts.  Traveling between different time zones. What are the signs or symptoms? If you have insomnia, trouble falling asleep or trouble staying asleep is the main symptom. This may lead to other symptoms, such as:  Feeling fatigued.  Feeling nervous about going to sleep.  Not feeling rested in the morning.  Having trouble concentrating.  Feeling irritable, anxious, or depressed. How is this treated? Treatment for insomnia depends on the cause. If your insomnia is caused by an underlying condition, treatment will focus on addressing the condition. Treatment may also include:  Medicines to help you sleep.  Counseling or therapy.  Lifestyle adjustments. Follow these instructions at home:  Take medicines only as directed by your health care provider.  Keep regular sleeping and waking hours. Avoid naps.  Keep a sleep diary to help you and your health care provider figure out what could be causing your insomnia. Include:  When you sleep.  When you wake up during the night.  How well you sleep.  How rested you feel the next day.  Any side effects of medicines you are taking.  What you eat and drink.  Make your bedroom a comfortable place where it is easy to fall asleep:  Put up shades or special blackout curtains to block light from outside.  Use a white noise machine to block noise.  Keep the temperature cool.  Exercise regularly as directed by your health care provider. Avoid exercising right before bedtime.  Use relaxation techniques to manage stress. Ask your health care  provider to suggest some techniques that may work well for you. These may include:  Breathing exercises.  Routines to release muscle tension.  Visualizing peaceful scenes.  Cut back on alcohol, caffeinated beverages, and cigarettes, especially close to bedtime. These can disrupt your  sleep.  Do not overeat or eat spicy foods right before bedtime. This can lead to digestive discomfort that can make it hard for you to sleep.  Limit screen use before bedtime. This includes:  Watching TV.  Using your smartphone, tablet, and computer.  Stick to a routine. This can help you fall asleep faster. Try to do a quiet activity, brush your teeth, and go to bed at the same time each night.  Get out of bed if you are still awake after 15 minutes of trying to sleep. Keep the lights down, but try reading or doing a quiet activity. When you feel sleepy, go back to bed.  Make sure that you drive carefully. Avoid driving if you feel very sleepy.  Keep all follow-up appointments as directed by your health care provider. This is important. Contact a health care provider if:  You are tired throughout the day or have trouble in your daily routine due to sleepiness.  You continue to have sleep problems or your sleep problems get worse. Get help right away if:  You have serious thoughts about hurting yourself or someone else. This information is not intended to replace advice given to you by your health care provider. Make sure you discuss any questions you have with your health care provider. Document Released: 01/17/2000 Document Revised: 06/21/2015 Document Reviewed: 10/20/2013 Elsevier Interactive Patient Education  2017 [employer].

## 2016-01-24 NOTE — Assessment & Plan Note (Signed)
Patient reports difficulty with sleep which she thinks is related to the [place] she is taking for post-surgical pain. She has trouble initiating and staying asleep. She does watch TV and use her cell phone prior to bed. She has tried melatonin in the past at bedtime without relief.   Patient has a history of insomnia. Doubt her Norco is related to her current sleep difficulties as it would generally cause drowsiness. She is a fall risk and not a great candidate for sleep aids. Patient counseled on sleep hygiene techniques and advised to retry Melatonin starting around sunset. Will defer further sleep aids to PCP.

## 2016-01-29 NOTE — Progress Notes (Signed)
Internal Medicine Clinic Attending  Case discussed with Dr. Patel,Vishal soon after the resident saw the patient.  We reviewed the resident's history and exam and pertinent patient test results.  I agree with the assessment, diagnosis, and plan of care documented in the resident's note. 

## 2016-02-03 DIAGNOSIS — J449 Chronic obstructive pulmonary disease, unspecified: Secondary | ICD-10-CM | POA: Diagnosis not present

## 2016-02-06 ENCOUNTER — Other Ambulatory Visit: Payer: Self-pay | Admitting: Student in an Organized Health Care Education/Training Program

## 2016-02-06 DIAGNOSIS — E119 Type 2 diabetes mellitus without complications: Secondary | ICD-10-CM

## 2016-02-12 ENCOUNTER — Other Ambulatory Visit: Payer: Self-pay | Admitting: Student in an Organized Health Care Education/Training Program

## 2016-02-21 ENCOUNTER — Other Ambulatory Visit: Payer: Self-pay | Admitting: *Deleted

## 2016-02-21 DIAGNOSIS — R109 Unspecified abdominal pain: Secondary | ICD-10-CM

## 2016-02-21 DIAGNOSIS — I10 Essential (primary) hypertension: Secondary | ICD-10-CM

## 2016-02-21 DIAGNOSIS — J438 Other emphysema: Secondary | ICD-10-CM

## 2016-02-21 DIAGNOSIS — E039 Hypothyroidism, unspecified: Secondary | ICD-10-CM

## 2016-02-21 DIAGNOSIS — J449 Chronic obstructive pulmonary disease, unspecified: Secondary | ICD-10-CM

## 2016-02-21 DIAGNOSIS — K219 Gastro-esophageal reflux disease without esophagitis: Secondary | ICD-10-CM

## 2016-02-24 MED ORDER — TIOTROPIUM BROMIDE MONOHYDRATE 18 MCG IN CAPS: 18.0000 ug | ORAL_CAPSULE | Freq: Every day | RESPIRATORY_TRACT | 3 refills | Status: DC

## 2016-02-24 MED ORDER — ATORVASTATIN CALCIUM 40 MG PO TABS: 40.0000 mg | ORAL_TABLET | Freq: Every day | ORAL | 3 refills | Status: DC

## 2016-02-24 MED ORDER — LISINOPRIL 20 MG PO TABS: 20.0000 mg | ORAL_TABLET | Freq: Every day | ORAL | 3 refills | Status: DC

## 2016-02-24 MED ORDER — CARVEDILOL 25 MG PO TABS: 25.0000 mg | ORAL_TABLET | Freq: Two times a day (BID) | ORAL | 3 refills | Status: DC

## 2016-02-24 MED ORDER — METFORMIN HCL 1000 MG PO TABS: 1000.0000 mg | ORAL_TABLET | Freq: Two times a day (BID) | ORAL | 3 refills | Status: DC

## 2016-02-24 MED ORDER — OMEPRAZOLE 20 MG PO CPDR: 20.0000 mg | DELAYED_RELEASE_CAPSULE | Freq: Every day | ORAL | 3 refills | Status: DC

## 2016-02-24 MED ORDER — LEVOTHYROXINE SODIUM 88 MCG PO TABS: 88.0000 ug | ORAL_TABLET | Freq: Every day | ORAL | 3 refills | Status: DC

## 2016-02-24 MED ORDER — FLUTICASONE-SALMETEROL 250-50 MCG/DOSE IN AEPB: 1.0000 | INHALATION_SPRAY | Freq: Two times a day (BID) | RESPIRATORY_TRACT | 3 refills | Status: DC

## 2016-02-24 MED ORDER — AMLODIPINE BESYLATE 5 MG PO TABS: 5.0000 mg | ORAL_TABLET | Freq: Every day | ORAL | 3 refills | Status: DC

## 2016-02-24 MED ORDER — PAROXETINE HCL 30 MG PO TABS: 30.0000 mg | ORAL_TABLET | Freq: Every day | ORAL | 3 refills | Status: DC

## 2016-04-02 ENCOUNTER — Encounter: Payer: Self-pay | Admitting: Internal Medicine

## 2016-04-02 ENCOUNTER — Ambulatory Visit (INDEPENDENT_AMBULATORY_CARE_PROVIDER_SITE_OTHER): Payer: Medicare Other | Admitting: Internal Medicine

## 2016-04-02 DIAGNOSIS — Z79899 Other long term (current) drug therapy: Secondary | ICD-10-CM

## 2016-04-02 DIAGNOSIS — Z8719 Personal history of other diseases of the digestive system: Secondary | ICD-10-CM | POA: Diagnosis not present

## 2016-04-02 DIAGNOSIS — J449 Chronic obstructive pulmonary disease, unspecified: Secondary | ICD-10-CM | POA: Diagnosis not present

## 2016-04-02 DIAGNOSIS — I10 Essential (primary) hypertension: Secondary | ICD-10-CM | POA: Diagnosis not present

## 2016-04-02 DIAGNOSIS — E039 Hypothyroidism, unspecified: Secondary | ICD-10-CM | POA: Diagnosis not present

## 2016-04-02 DIAGNOSIS — Z87891 Personal history of nicotine dependence: Secondary | ICD-10-CM

## 2016-04-02 DIAGNOSIS — Z9889 Other specified postprocedural states: Secondary | ICD-10-CM | POA: Diagnosis not present

## 2016-04-02 DIAGNOSIS — Z299 Encounter for prophylactic measures, unspecified: Secondary | ICD-10-CM

## 2016-04-02 DIAGNOSIS — R1013 Epigastric pain: Secondary | ICD-10-CM | POA: Diagnosis not present

## 2016-04-02 DIAGNOSIS — Z9049 Acquired absence of other specified parts of digestive tract: Secondary | ICD-10-CM | POA: Diagnosis not present

## 2016-04-02 MED ORDER — SIMETHICONE 80 MG PO CHEW: CHEWABLE_TABLET | ORAL | 0 refills | Status: DC

## 2016-04-02 NOTE — Addendum Note (Signed)
Addended by: Hulan Fray on: 04/02/2016 07:05 PM   Modules accepted: Orders

## 2016-04-02 NOTE — Patient Instructions (Signed)
For your stomach pain, I have sent in a gas reducer pill to your pharmacy. I want you to chew one pill after each meal and at bedtime.  Continue taking your stool softeners. Fatty foods can increase gas, so try to limit these.  If you notice a fever, increased pain, nausea, vomiting, blood in your stool or black stools, please call the clinic and we will see you.
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
## 2016-04-03 LAB — TSH: TSH: 2.28 u[IU]/mL (ref 0.450–4.500)

## 2016-04-05 NOTE — Assessment & Plan Note (Signed)
Patient denies hypothyroid symptoms. TSH last checked in 2016 wnl. If undertreated, hypothyroidism may be contributing to her abdominal complaints.   Plan: --check TSH > wnl --continue current synthroid dose

## 2016-04-05 NOTE — Assessment & Plan Note (Signed)
Patient hypertensive on initial and recheck BP today. She was in distress about her abdominal pain so no change in regimen will be made at this time. She states compliance with her medicines though is unable to name them or identify by name.

## 2016-04-05 NOTE — Assessment & Plan Note (Addendum)
Patient was recently admitted to [facility] hospital for acute cholecystitis; on 01/17/2016 she had a lap chole, ERCP w/ sphincerectomy and common bile duct stone extraction. Since hospitalization patient has experienced "movement" in her right abdomen following meals, however has also had a one day history of upper abdominal pain. The pain is intermittent (3 episodes, lasting 20 mins), sharp, not associated with meals, without radiation, not associated with chest pain, shortness of breath, nausea, vomiting, diarrhea, melena, hematochezia, early satiety. She is unable to say if pain changes with position. Patient has not had change in appetite or weight loss. Patient expressed extreme concern about these new symptoms. She recalls the surgeons at stating they might have to operate on her again and patient is wondering if we are with-holding information from her.  Patient was re-assured that information is not being with-held from her. I explained that since she had a common bile duct stone extraction, there is a possibility of another stone to have been missed or to reform in her duct which is why the surgeons likely stated that she may have another surgery in the future. At this time, her symptoms are not consistent with retained stone, pancreatitis, gastritis, PUD, or ischemia. He abdominal exam is benign. Her symptoms are likely due to gas which can cause similar, episodic, sharp pains.   Plan: --simethicone with meals and at bedtime --check TSH as hypothyroidism can cause constipation as worsen her symptoms > wnl --advised about consumption of fatty foods --discussed strict return precautions including fever, worsening abdominal pain, N/V, melena, hematochezia, appetite or satiety change.

## 2016-04-05 NOTE — Assessment & Plan Note (Signed)
Patient received flu shot today 

## 2016-04-06 NOTE — Progress Notes (Signed)
Case discussed with Dr. Svalina at the time of the visit. We reviewed the resident's history and exam and pertinent patient test results. I agree with the assessment, diagnosis, and plan of care documented in the resident's note. 

## 2016-04-17 ENCOUNTER — Ambulatory Visit (HOSPITAL_COMMUNITY)
Admission: EM | Admit: 2016-04-17 | Discharge: 2016-04-17 | Disposition: A | Discharge status: Home / Self Care | Payer: Medicare Other | Attending: Internal Medicine | Admitting: Internal Medicine

## 2016-04-17 ENCOUNTER — Encounter (HOSPITAL_COMMUNITY): Payer: Self-pay | Admitting: Emergency Medicine

## 2016-04-17 DIAGNOSIS — R6889 Other general symptoms and signs: Secondary | ICD-10-CM

## 2016-04-17 DIAGNOSIS — R05 Cough: Secondary | ICD-10-CM

## 2016-04-17 DIAGNOSIS — R059 Cough, unspecified: Secondary | ICD-10-CM

## 2016-04-17 DIAGNOSIS — R110 Nausea: Secondary | ICD-10-CM

## 2016-04-17 MED ORDER — ACETAMINOPHEN 325 MG PO TABS
ORAL_TABLET | ORAL | Status: AC
  Filled 2016-04-17: qty 2

## 2016-04-17 MED ORDER — ONDANSETRON 4 MG PO TBDP: 4.0000 mg | ORAL_TABLET | Freq: Three times a day (TID) | ORAL | 0 refills | Status: DC | PRN

## 2016-04-17 MED ORDER — ACETAMINOPHEN 325 MG PO TABS
650.0000 mg | ORAL_TABLET | Freq: Once | ORAL | Status: AC
  Administered 2016-04-17: 650 mg via ORAL

## 2016-04-17 MED ORDER — AZITHROMYCIN 250 MG PO TABS: 250.0000 mg | ORAL_TABLET | Freq: Every day | ORAL | 0 refills | Status: DC

## 2016-04-17 MED ORDER — OSELTAMIVIR PHOSPHATE 75 MG PO CAPS: 75.0000 mg | ORAL_CAPSULE | Freq: Two times a day (BID) | ORAL | 0 refills | Status: DC

## 2016-04-17 NOTE — ED Triage Notes (Signed)
Pt c/o cold sx onset: 2 days  Sx include: abd pain, nauseas, decreased appetite, prod cough , nasal congestion, runny nose, fevers   Taking: OTC cold meds w/temp relief.... Last had acetaminophen around 0900 today   A&O x4... NAD'
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
## 2016-05-03 DIAGNOSIS — J449 Chronic obstructive pulmonary disease, unspecified: Secondary | ICD-10-CM | POA: Diagnosis not present

## 2016-06-02 DIAGNOSIS — J449 Chronic obstructive pulmonary disease, unspecified: Secondary | ICD-10-CM | POA: Diagnosis not present

## 2016-07-03 DIAGNOSIS — J449 Chronic obstructive pulmonary disease, unspecified: Secondary | ICD-10-CM | POA: Diagnosis not present

## 2016-08-02 DIAGNOSIS — J449 Chronic obstructive pulmonary disease, unspecified: Secondary | ICD-10-CM | POA: Diagnosis not present

## 2016-08-24 ENCOUNTER — Ambulatory Visit: Payer: Medicare Other | Admitting: Student in an Organized Health Care Education/Training Program

## 2016-09-02 DIAGNOSIS — J449 Chronic obstructive pulmonary disease, unspecified: Secondary | ICD-10-CM | POA: Diagnosis not present

## 2016-10-03 DIAGNOSIS — J449 Chronic obstructive pulmonary disease, unspecified: Secondary | ICD-10-CM | POA: Diagnosis not present

## 2016-11-02 DIAGNOSIS — J449 Chronic obstructive pulmonary disease, unspecified: Secondary | ICD-10-CM | POA: Diagnosis not present

## 2016-11-09 ENCOUNTER — Other Ambulatory Visit: Payer: Self-pay | Admitting: Student in an Organized Health Care Education/Training Program

## 2016-11-09 DIAGNOSIS — J449 Chronic obstructive pulmonary disease, unspecified: Secondary | ICD-10-CM

## 2016-11-16 ENCOUNTER — Ambulatory Visit: Payer: Medicare Other | Admitting: Student in an Organized Health Care Education/Training Program

## 2016-12-01 ENCOUNTER — Other Ambulatory Visit: Payer: Self-pay

## 2016-12-02 ENCOUNTER — Other Ambulatory Visit: Payer: Self-pay | Admitting: *Deleted

## 2016-12-02 NOTE — Patient Outreach (Signed)
[facility]) Care Management  12/02/2016  Caitlyn Aguirre Jun 18, 1935 [id number]  Telephone Screen  Referral Date: 12/01/16 Referral Source: EMMI Prevent Referral Reason: DM, HTN, Heart Failure, Atrial Fibriallation Insurance:[employer] Medicare   Outreach telephone call to patient. Patient needs further in home eval/assessment of care needs with medical management. Patient reported, she is not monitoring her blood glucose, blood pressure, and weight. She doesn't have a scale at home. She doesn't like to "stick herself to check her blood sugar". She reported having chronic pain stemming from arthritis. She takes Advil at night to help her rest, however it doesn't eliminate her pain. She is dependent on Oxygen, which she uses as needed. Patient reported, she takes 11 prescribed medications. She receives her medications via mail. She has to pay out of pocket for 3 inhalers. She verbalized taking her medications as prescribed. She has a primary MD appointment scheduled for 12/07/16.Patient plans to ask the MD about pain medications for her arthritis. She requested information on Advanced Directives. [facility] services and benefits explained to patient. Patient agreed to [facility] services.   Plan: RN CM advised patient to contact RNCM for any needs or concerns. RN CM will send referral to [facility] RN for further in home eval/assessment of care needs and management of chronic conditions. RN CM will send [place] referral for polypharmacy med review-patient taking greater than 10 meds.   Lake Bells, RN, BSN, MHA/MSL, [place] Telephonic Care Manager Coordinator [facility] Direct Phone: [phone] Toll Free: [phone] Fax: [phone]

## 2016-12-03 DIAGNOSIS — J449 Chronic obstructive pulmonary disease, unspecified: Secondary | ICD-10-CM | POA: Diagnosis not present

## 2016-12-04 ENCOUNTER — Other Ambulatory Visit: Payer: Self-pay | Admitting: *Deleted

## 2016-12-04 NOTE — Patient Outreach (Signed)
[facility]) Care Management  12/04/2016  Caitlyn Aguirre April 08, 1935 [id number]   Referral received from telephonic care manager, request to assess for further care needs.  Per chart, she has history of hypertension, cardiomyopathy, COPD, diabetes, hypothyroidism, and depression.  Call placed to member for telephone assessment, no answer.  HIPAA compliant voice message left, will await call back.  If no call back, will follow up next week.  Caitlyn Aguirre, [place], MSN [place] [phone]

## 2016-12-07 ENCOUNTER — Encounter (INDEPENDENT_AMBULATORY_CARE_PROVIDER_SITE_OTHER): Payer: Self-pay

## 2016-12-07 ENCOUNTER — Encounter: Payer: Self-pay | Admitting: Student in an Organized Health Care Education/Training Program

## 2016-12-07 ENCOUNTER — Other Ambulatory Visit: Payer: Self-pay | Admitting: *Deleted

## 2016-12-07 ENCOUNTER — Ambulatory Visit (INDEPENDENT_AMBULATORY_CARE_PROVIDER_SITE_OTHER): Payer: Medicare Other | Admitting: Student in an Organized Health Care Education/Training Program

## 2016-12-07 ENCOUNTER — Encounter: Payer: Self-pay | Admitting: *Deleted

## 2016-12-07 VITALS — BP 150/70 | HR 68 | Temp 98.4°F | Ht 68.0 in | Wt 212.4 lb

## 2016-12-07 DIAGNOSIS — Z79899 Other long term (current) drug therapy: Secondary | ICD-10-CM

## 2016-12-07 DIAGNOSIS — M8949 Other hypertrophic osteoarthropathy, multiple sites: Secondary | ICD-10-CM

## 2016-12-07 DIAGNOSIS — Z23 Encounter for immunization: Secondary | ICD-10-CM

## 2016-12-07 DIAGNOSIS — Z8719 Personal history of other diseases of the digestive system: Secondary | ICD-10-CM | POA: Diagnosis not present

## 2016-12-07 DIAGNOSIS — I420 Dilated cardiomyopathy: Secondary | ICD-10-CM

## 2016-12-07 DIAGNOSIS — E039 Hypothyroidism, unspecified: Secondary | ICD-10-CM | POA: Diagnosis not present

## 2016-12-07 DIAGNOSIS — Z7984 Long term (current) use of oral hypoglycemic drugs: Secondary | ICD-10-CM

## 2016-12-07 DIAGNOSIS — Z87891 Personal history of nicotine dependence: Secondary | ICD-10-CM

## 2016-12-07 DIAGNOSIS — M159 Polyosteoarthritis, unspecified: Secondary | ICD-10-CM | POA: Diagnosis not present

## 2016-12-07 DIAGNOSIS — Z7982 Long term (current) use of aspirin: Secondary | ICD-10-CM

## 2016-12-07 DIAGNOSIS — I10 Essential (primary) hypertension: Secondary | ICD-10-CM | POA: Diagnosis not present

## 2016-12-07 DIAGNOSIS — E119 Type 2 diabetes mellitus without complications: Secondary | ICD-10-CM | POA: Diagnosis not present

## 2016-12-07 DIAGNOSIS — M150 Primary generalized (osteo)arthritis: Secondary | ICD-10-CM

## 2016-12-07 DIAGNOSIS — Z79890 Hormone replacement therapy (postmenopausal): Secondary | ICD-10-CM

## 2016-12-07 LAB — POCT GLYCOSYLATED HEMOGLOBIN (HGB A1C): Hemoglobin A1C: 7.3

## 2016-12-07 LAB — GLUCOSE, CAPILLARY: Glucose-Capillary: 186 mg/dL — ABNORMAL HIGH (ref 65–99)

## 2016-12-07 MED ORDER — MELOXICAM 15 MG PO TABS: 15.0000 mg | ORAL_TABLET | Freq: Two times a day (BID) | ORAL | 1 refills | Status: DC | PRN

## 2016-12-07 MED ORDER — DICLOFENAC SODIUM 1 % TD GEL: 2.0000 g | Freq: Three times a day (TID) | TRANSDERMAL | 2 refills | Status: DC | PRN

## 2016-12-07 NOTE — Patient Outreach (Signed)
[place] [facility]) Care Management  12/07/2016  Caitlyn Aguirre Mar 05, 1935 [id number]   Second attempt made to contact member for telephone assessment.  Identity verified.  This care manager introduced self and purpose of call.  [facility] care management services explained.  She report that she had a visit with her primary MD today and state she feel she is managing her health well.  State her MD denied any concerns, reported that she is "doing very good."  Her A1C has decreased to 7.3.  She declines offer to be involved with [facility].  Benefits of [facility] again explained, however she continues to decline involvement.  Will send successful outreach letter to member with contact information, advised to contact in the future is needs change.  Will notify care management assistant and primary MD.    Valente David, RN, MSN [place] [phone]

## 2016-12-07 NOTE — Progress Notes (Signed)
   Assessment and Plan:  See Encounters tab for problem-based medical decision making.   __________________________________________________________  HPI:   81 year old woman here for follow-up of diabetes and hypertension.  It has been 11 months since I last saw the patient for a routine visit.  At that time she had acute cholecystitis and was admitted from our clinic.  She followed up one other time for an acute complaint of abdominal pain which is now resolved.  She reports doing very well, her daughter was admitted to the hospital for over 2 months recently and is now in a skilled nursing facility doing a slow rehab.  Patient reports that this is been very stressful time for her.  However she is trying to be compliant with her medications and improve her diet and lifestyle.  She reports her mood is good.  She does complain of increasing arthritis in her hands, thumbs, wrists, elbows, and knees over the last few weeks.  Says this happens when he gets cold.  She reports that he does sometimes limit her daily activities.  She also reports having some memory difficulties.  Says she is misplacing a few items, trouble with people's names.  Still drives and denies any difficulty with getting lost.  She lives with another daughter and reports doing well at home.  No other recent fevers or chills, chest pain, shortness of breath, orthopnea, or PND.  No recent falls.    __________________________________________________________  Problem List: Patient Active Problem List   Diagnosis Date Noted  . High Risk for Falls 07/18/2015    Priority: High  . COPD ([place]) 01/21/2006    Priority: High  . Diabetes mellitus ([place]) 10/04/1998    Priority: High  . Obstructive sleep apnea 12/25/2011    Priority: Medium  . Hypothyroidism 01/21/2006    Priority: Medium  . Depression 01/21/2006    Priority: Medium  . Essential hypertension 01/21/2006    Priority: Medium  . Osteoarthritis 01/21/2006    Priority:  Medium  . GERD (gastroesophageal reflux disease) 06/16/2015    Priority: Low  . Urinary, incontinence, stress female 05/09/2015    Priority: Low  . Insomnia 05/09/2015    Priority: Low  . Eczema of hand 06/21/2013    Priority: Low  . Preventive measure 02/17/2013    Priority: Low    Medications: Reconciled today in Epic __________________________________________________________  Physical Exam:  Vital Signs: Vitals:   12/07/16 0854 12/07/16 0922  BP: (!) 168/79 (!) 150/70  Pulse: 68   Temp: 98.4 F (36.9 C)   TempSrc: Oral   SpO2: 97%   Weight: 212 lb 6.4 oz (96.3 kg)   Height: 5\' 8"  (1.727 m)     Gen: Well appearing, NAD CV: RRR, no murmurs Pulm: Normal effort, CTA throughout, no wheezing Abd: Soft, NT, ND. Ext: Warm, no edema, mild crepitus in the left knee, no synovitis in hands, no other joint deformity. Skin: No atypical appearing moles. No rashes Neuro: Alert, oriented, conversational.  Can do calculations in her head, normal strength in the upper and lower extremities, cranial nerves are intact.

## 2016-12-07 NOTE — Assessment & Plan Note (Signed)
Hemoglobin A1c is at goal at 7.3%.  Doing very well at home, not checking home sugars which is fine.  Plan to continue metformin 1000 mg twice daily.  Will continue aspirin and atorvastatin for secondary prevention of ischemic vascular disease.  Will check BMP today to rule out renal disease.  Have referred to ophthalmology and foot exam completed today.

## 2016-12-07 NOTE — Assessment & Plan Note (Signed)
Blood pressure is elevated above goal today.  She did not take her morning medications today because she did not want it to interfere with her blood work.  Does not check ambulatory blood pressure.  Plan is to continue amlodipine 5, lisinopril 20, and carvedilol 25 mg twice daily.  I asked her to have her blood pressure checked in between visits at her local pharmacy.  Also advised that she can take all of her medications prior to office visit so that I can better assess BP control on current meds.

## 2016-12-07 NOTE — Assessment & Plan Note (Signed)
Diffuse osteoarthritis with stiffness in hands, bilateral wrists, elbows,  and knees.  No changes of synovitis on exam, I doubt inflammatory arthritis.  She reports that she needs something stronger than Aleve, current pain level is limiting her functional status.  Patient does not tolerate opioids very well, is prone to insomnia and encephalopathy.  Plan is to use meloxicam 15 mg every 12 hours as needed, I advised she does not take this daily.  Also ordered Voltaren gel for stiffness of her hands and wrist.

## 2016-12-07 NOTE — Assessment & Plan Note (Signed)
Patient on moderate dose of levothyroxine, no symptoms of hypo-or hyperthyroid.  Plan to check TSH today, continue Synthroid 88 mcg daily for now.

## 2016-12-08 ENCOUNTER — Encounter: Payer: Self-pay | Admitting: Student in an Organized Health Care Education/Training Program

## 2016-12-08 LAB — BMP8+ANION GAP
Anion Gap: 15 mmol/L (ref 10.0–18.0)
BUN/Creatinine Ratio: 10 — ABNORMAL LOW (ref 12–28)
BUN: 9 mg/dL (ref 8–27)
CO2: 22 mmol/L (ref 20–29)
Calcium: 10.3 mg/dL (ref 8.7–10.3)
Chloride: 106 mmol/L (ref 96–106)
Creatinine, Ser: 0.86 mg/dL (ref 0.57–1.00)
GFR calc Af Amer: 74 mL/min/{1.73_m2} (ref >59)
GFR calc non Af Amer: 64 mL/min/{1.73_m2} (ref >59)
Glucose: 198 mg/dL — ABNORMAL HIGH (ref 65–99)
Potassium: 4.5 mmol/L (ref 3.5–5.2)
Sodium: 143 mmol/L (ref 134–144)

## 2016-12-08 LAB — TSH: TSH: 3.16 u[IU]/mL (ref 0.450–4.500)

## 2016-12-16 ENCOUNTER — Other Ambulatory Visit: Payer: Self-pay | Admitting: Pharmacist

## 2016-12-16 NOTE — Patient Outreach (Addendum)
[place] [facility]) Care Management  [place]   12/16/2016  Caitlyn Aguirre 1935/07/14 [id number]  Subjective:  Patient was referred to [place] Pharmacist by [facility] RN [place] for polypharmacy.    Successful phone outreach to patient---note per [place] note, patient has previously declined [facility] RN.   HIPAA details verified, purpose of call explained to patient.  Patient reports she appreciated the call as she changed her mind and would be interested in [facility] RN to assist with teaching blood glucose and blood pressure monitoring.   Patient reports she obtains her medications via mail order.  She reports she is using a pill box and reports her daughter helps her fill her pill box.    Patient reports she would be able to review medications.    Objective:   Current Medications: Current Outpatient Medications  Medication Sig Dispense Refill  . albuterol (PROVENTIL HFA;VENTOLIN HFA) 108 (90 Base) MCG/ACT inhaler Inhale 1-2 puffs into the lungs every 6 (six) hours as needed for wheezing or shortness of breath. 1 Inhaler 0  . amLODipine (NORVASC) 5 MG tablet Take 1 tablet (5 mg total) by mouth daily. 90 tablet 3  . aspirin EC 81 MG tablet Take 1 tablet (81 mg total) by mouth daily. 90 tablet 3  . atorvastatin (LIPITOR) 40 MG tablet Take 1 tablet (40 mg total) by mouth daily at 6 PM. 90 tablet 3  . carvedilol (COREG) 25 MG tablet Take 1 tablet (25 mg total) by mouth 2 (two) times daily with a meal. 180 tablet 3  . levothyroxine (SYNTHROID, LEVOTHROID) 88 MCG tablet Take 1 tablet (88 mcg total) by mouth daily before breakfast. Take 1 tablet by mouth every morning 30 minutes before other medications and food. Take with water only. 90 tablet 3  . lisinopril (PRINIVIL,ZESTRIL) 20 MG tablet Take 1 tablet (20 mg total) by mouth daily. 90 tablet 3  . meloxicam (MOBIC) 15 MG tablet Take 1 tablet (15 mg total) every 12 (twelve) hours as needed by mouth for pain. 30 tablet 1  .  metFORMIN (GLUCOPHAGE) 1000 MG tablet Take 1 tablet (1,000 mg total) by mouth 2 (two) times daily with a meal. 180 tablet 3  . omeprazole (PRILOSEC) 20 MG capsule Take 1 capsule (20 mg total) by mouth daily with supper. 90 capsule 3  . PARoxetine (PAXIL) 30 MG tablet Take 1 tablet (30 mg total) by mouth daily. 90 tablet 3  . ADVAIR DISKUS 250-50 MCG/DOSE AEPB USE 1 PUFF TWO TIMES DAILY 60 each 2  . diclofenac sodium (VOLTAREN) 1 % GEL Apply 2 g every 8 (eight) hours as needed topically (hand and wrist pain or stiffness). 100 g 2  . OXYGEN Inhale into the lungs at bedtime as needed (for breathing).     Marland Kitchen tiotropium (SPIRIVA HANDIHALER) 18 MCG inhalation capsule Place 1 capsule (18 mcg total) into inhaler and inhale daily. INHALE THE CONTENTS OF 1 CAPSULE VIA HANDIHALER BY MOUTH EVERY DAY 90 capsule 3   No current facility-administered medications for this visit.     Functional Status: In your present state of health, do you have any difficulty performing the following activities: 04/02/2016 01/24/2016  Hearing? Tempie Donning  Vision? N N  Difficulty concentrating or making decisions? Y N  Walking or climbing stairs? Y Y  Dressing or bathing? N N  Doing errands, shopping? Y Y  Some recent data might be hidden    Fall/Depression Screening: Fall Risk  12/02/2016 04/02/2016 01/24/2016  Falls in the past year? No No Yes  Number falls in past yr: - - 1  Injury with Fall? - - Yes  Comment - - -  Risk Factor Category  - - High Fall Risk  Risk for fall due to : - History of fall(s);Impaired mobility History of fall(s);Impaired balance/gait  Follow up - - -   PHQ 2/9 Scores 12/07/2016 12/02/2016 04/02/2016 01/24/2016 12/02/2015 10/23/2015 09/23/2015  PHQ - 2 Score 1 1 0 6 6 6 2   PHQ- 9 Score - - - 21 24 27 7   Exception Documentation - - - - - - -  Not completed - - - - - - -    Assessment:  Medication review per patient report and medication list in chart.   Drugs sorted by  system:  Neurologic/Psychologic: -paroxetine  Cardiovascular: -amlodipine -aspirin -atorvastatin -carvedilol---patient reports only taking once daily  -lisinopril   Pulmonary/Allergy: -albuterol  -Advair---patient reports not taking every day---patient was counseled to rinse her mouth with water and spit after each use.   -Spiriva---patient reports not taking every day   Gastrointestinal: -omeprazole   Endocrine: -levothyroxine  -metformin---patient reports only takes once daily  Pain: -diclofenac gel -meloxicam   Other issues noted:   1) carvedilol---patient reports taking once daily for "a long time."  Please clarify if patient should increase dose to twice daily or if she needs to be seen sooner than scheduled   2) metformin---patient reports taking once daily for "a long time."  12/07/16 A1c noted to be 7.3%---please clarify if patient should increase to twice daily   3) Advair and Spiriva---patient reports not taking every day due to overall medication administration burden.  She reports if she could get her medications in one inhaler, she would be more willing to take her maintenance inhalers daily.    Trelegy (fluticasone/umeclidinum/vilanterol) appears to be Tier 3 on her Part D plan.    4) suggest monitoring renal function given lisinopril use and recent initiation of NSAID.  SCr 0.86 on 12/07/16.    Plan:  Will enter referral for [facility] RN.    Will route note to PCP.    Will follow-up with patient via phone, in the next 2 weeks.   Karrie Meres, PharmD, [place] [phone]

## 2016-12-17 ENCOUNTER — Encounter: Payer: Self-pay | Admitting: Pharmacist

## 2016-12-17 ENCOUNTER — Other Ambulatory Visit: Payer: Self-pay | Admitting: Student in an Organized Health Care Education/Training Program

## 2016-12-17 MED ORDER — METFORMIN HCL 1000 MG PO TABS: 1000.0000 mg | ORAL_TABLET | Freq: Every day | ORAL | 3 refills | Status: DC

## 2016-12-18 ENCOUNTER — Other Ambulatory Visit: Payer: Self-pay | Admitting: Pharmacist

## 2016-12-18 NOTE — Patient Outreach (Signed)
[place] [facility]) Care Management  12/18/2016  JAHLEAH MARISCAL 03/01/1935 [id number]  Received the below message from Dr Evette Doffing in reply to medication concerns with patient:    Message  Received: Yesterday  Message Contents  Axel Filler, MD  Keiton Cosma, [place] Iha, [facility]        Metformin 1000mg  once daily is fine because she has good A1c control. However, her BP control is suboptimal. I would like her to use Carvedilol 25mg  bid.   I would not be opposed to Trelegy, but I doubt she needs triple therapy. We can stop the Advair, and just use once daily Spiriva as her maintenance. If symptoms worsen, I will switch to Trelegy.   Thank you for helping.   Damita Dunnings   Successful phone outreach to patient.  HIPAA details verified.  Informed patient of the above medication changes per Dr Vincent's orders.   Patient repeated back that she would take metformin once daily, carvedilol twice daily, stop her Advair and continue Spiriva.  She verbalized understanding.  She was counseled to report any change in symptoms to her PCP.    Plan:  Will place a follow-up call to patient within the next 2 weeks to see if she has any further pharmacy needs.   Karrie Meres, PharmD, [place] [phone]

## 2016-12-21 ENCOUNTER — Other Ambulatory Visit: Payer: Self-pay | Admitting: *Deleted

## 2016-12-21 NOTE — Patient Outreach (Signed)
[place] [facility]) Care Management  12/21/2016  Caitlyn Aguirre 1935-05-02 [id number]   New referral received from [facility] pharmacist per [facility] request for additional help managing chronic conditions.  She recently declined [name] but has since changed her mind.  Call placed to member, however call didn't go completely through.  Phone did not ring, no answering service, disconnected after several seconds. This was the outcome of 3 consecutive attempts.  Will make another attempt to contact tomorrow.  Valente David, [place], MSN [place] [phone]

## 2016-12-22 ENCOUNTER — Other Ambulatory Visit: Payer: Self-pay | Admitting: *Deleted

## 2016-12-22 NOTE — Patient Outreach (Signed)
[facility]) Care Management  12/22/2016  CYNETHIA SCHINDLER [birth year] [id number]   Second attempt made to contact member.  She report she has changed her mind regarding involvement with [facility].  State she would like help managing her diabetes and hypertension.  She sate she does not have a blood pressure monitor nor does she have a glucose meter.  Will assist member with obtaining both within the next couple weeks.  She agrees to home visit for further evaluation/assessment next week.    Valente David, [place], MSN [place] [phone]

## 2016-12-25 ENCOUNTER — Ambulatory Visit: Payer: Medicare Other | Admitting: Pharmacist

## 2016-12-28 ENCOUNTER — Other Ambulatory Visit: Payer: Self-pay | Admitting: Student in an Organized Health Care Education/Training Program

## 2016-12-28 DIAGNOSIS — K219 Gastro-esophageal reflux disease without esophagitis: Secondary | ICD-10-CM

## 2016-12-28 DIAGNOSIS — I10 Essential (primary) hypertension: Secondary | ICD-10-CM

## 2016-12-28 DIAGNOSIS — E039 Hypothyroidism, unspecified: Secondary | ICD-10-CM

## 2016-12-28 DIAGNOSIS — R109 Unspecified abdominal pain: Secondary | ICD-10-CM

## 2016-12-30 ENCOUNTER — Encounter: Payer: Self-pay | Admitting: *Deleted

## 2016-12-30 ENCOUNTER — Other Ambulatory Visit: Payer: Self-pay | Admitting: *Deleted

## 2016-12-30 NOTE — Patient Outreach (Signed)
[facility]) Care Management   12/30/2016  Caitlyn Aguirre 02-06-1935 [id number]  Caitlyn Aguirre is an 81 y.o. female  Subjective:   Member alert and oriented x3, denies any pain or discomfort at this time.  Report compliance with medications and with MD appointments.  State she still drive, able to transport herself to appointments.  Objective:   Review of Systems  Constitutional: Negative.   HENT: Negative.   Eyes: Negative.   Respiratory: Negative.   Cardiovascular: Negative.   Gastrointestinal: Negative.   Genitourinary: Negative.   Musculoskeletal: Negative.   Skin: Negative.   Neurological: Negative.   Endo/Heme/Allergies: Negative.   Psychiatric/Behavioral: Negative.     Physical Exam  Constitutional: She is oriented to person, place, and time. She appears well-developed and well-nourished.  Neck: Normal range of motion.  Cardiovascular: Normal rate, regular rhythm and normal heart sounds.  Respiratory: Effort normal and breath sounds normal.  GI: Soft. Bowel sounds are normal.  Musculoskeletal: Normal range of motion.  Neurological: She is alert and oriented to person, place, and time.  Skin: Skin is warm and dry.   BP 139/81   Pulse 81   Ht 1.727 m (5' 8" )   Wt 212 lb (96.2 kg)   SpO2 95%   BMI 32.23 kg/m   Encounter Medications:   Outpatient Encounter Medications as of 12/30/2016  Medication Sig Note  . albuterol (PROVENTIL HFA;VENTOLIN HFA) 108 (90 Base) MCG/ACT inhaler Inhale 1-2 puffs into the lungs every 6 (six) hours as needed for wheezing or shortness of breath.   Marland Kitchen amLODipine (NORVASC) 5 MG tablet TAKE 1 TABLET BY MOUTH  DAILY   . aspirin EC 81 MG tablet Take 1 tablet (81 mg total) by mouth daily.   Marland Kitchen atorvastatin (LIPITOR) 40 MG tablet TAKE 1 TABLET BY MOUTH  DAILY AT 6 PM.   . carvedilol (COREG) 25 MG tablet TAKE 1 TABLET BY MOUTH TWO  TIMES DAILY WITH A MEAL   . levothyroxine (SYNTHROID, LEVOTHROID) 88 MCG tablet TAKE 1 TABLET  BY MOUTH  EVERY MORNING 30 MINUTES  BEFORE OTHER MEDICATIONS  AND FOOD. TAKE WITH WATER  ONLY.   Marland Kitchen lisinopril (PRINIVIL,ZESTRIL) 20 MG tablet TAKE 1 TABLET BY MOUTH  DAILY   . meloxicam (MOBIC) 15 MG tablet Take 1 tablet (15 mg total) every 12 (twelve) hours as needed by mouth for pain.   . metFORMIN (GLUCOPHAGE) 1000 MG tablet TAKE 1 TABLET BY MOUTH  TWICE A DAY WITH A MEAL   . omeprazole (PRILOSEC) 20 MG capsule TAKE 1 CAPSULE BY MOUTH  DAILY WITH SUPPER   . OXYGEN Inhale into the lungs at bedtime as needed (for breathing).    Marland Kitchen PARoxetine (PAXIL) 30 MG tablet Take 1 tablet (30 mg total) by mouth daily.   Marland Kitchen tiotropium (SPIRIVA HANDIHALER) 18 MCG inhalation capsule Place 1 capsule (18 mcg total) into inhaler and inhale daily. INHALE THE CONTENTS OF 1 CAPSULE VIA HANDIHALER BY MOUTH EVERY DAY 12/16/2016: Patient reports not taking daily  . diclofenac sodium (VOLTAREN) 1 % GEL Apply 2 g every 8 (eight) hours as needed topically (hand and wrist pain or stiffness). (Patient not taking: Reported on 12/30/2016)    No facility-administered encounter medications on file as of 12/30/2016.     Functional Status:   In your present state of health, do you have any difficulty performing the following activities: 12/30/2016 04/02/2016  Hearing? Caitlyn Aguirre  Vision? Y N  Difficulty concentrating or making decisions? Caitlyn Aguirre  Comment forgetful -  Walking or climbing stairs? Y Y  Comment Difficulty breathing  -  Dressing or bathing? N N  Doing errands, shopping? N Y  Conservation officer, nature and eating ? N -  Using the Toilet? N -  In the past six months, have you accidently leaked urine? N -  Do you have problems with loss of bowel control? N -  Managing your Medications? N -  Managing your Finances? N -  Housekeeping or managing your Housekeeping? N -  Some recent data might be hidden    Fall/Depression Screening:    Fall Risk  12/30/2016 12/02/2016 04/02/2016  Falls in the past year? No No No  Number falls in past yr:  - - -  Injury with Fall? - - -  Comment - - -  Risk Factor Category  - - -  Risk for fall due to : - - History of fall(s);Impaired mobility  Follow up - - -   PHQ 2/9 Scores 12/30/2016 12/07/2016 12/02/2016 04/02/2016 01/24/2016 12/02/2015 10/23/2015  PHQ - 2 Score 1 1 1  0 6 6 6   PHQ- 9 Score - - - - 21 24 27   Exception Documentation - - - - - - -  Not completed - - - - - - -    Assessment:    Met with member at scheduled time.  Reintroduced to [facility] care management services as she was active in the past.  New consent obtained.  Diabetes well managed at this time (last A1C - 7.05 December 2016), however she does not check blood sugar.  Report that she is unable to stick her own finger. Daughter used to attempt to check blood sugar but was also unsuccessful as member would not allow it.  MD aware that she is not checking blood sugar, does not take insulin.   State her MD would like for her to continue work to improve her blood pressure, will place in hypertension program and assist with management.  Denies any concerns at this time.  Provided with contact information for this care manager, advised to contact with questions.  Advised of 24 hour nurse triage line as well.  Next follow up appointment with primary MD in February, last visit was 12/07/16.  Plan:   Will follow up next month with routine home visit.  [facility] CM Care Plan Problem One     Most Recent Value  Care Plan Problem One  Risk for medical complications requiring hospitalization regarding hypertension as evidenced by uncontrolled hypertension  Role Documenting the Problem One  Care Management Coordinator  Care Plan for Problem One  Active  [facility] Long Term Goal   Member will maintain blood pressure within range (140/90) over the next 45 days  [facility] Long Term Goal Start Date  12/30/16  Interventions for Problem One Long Term Goal  Member educated on management of hypertension, including low salt diet.  Educated on complications of  uncontrolled hypertension  [facility] CM Short Term Goal #1   Member will report taking blood pressure and record readings at least 3 times a week over the next 4 weeks  [facility] CM Short Term Goal #1 Start Date  12/30/16  Interventions for Short Term Goal #1  Provided with blood pressure monitor, educated on correct usage.  Provided with [facility] calendar book with blood pressure log. Advised of importance of consistent management of blood pressure.  [facility] CM Short Term Goal #2   Member will report compliance with low salt diet over  the next 4 weeks  [facility] CM Short Term Goal #2 Start Date  12/30/16  Interventions for Short Term Goal #2  Member educated on importance of following low salt diet.  Provided with printout of examples of foods high as salt as well as low in salt     Valente David, MSN, [place] Manager [phone]

## 2016-12-31 ENCOUNTER — Other Ambulatory Visit: Payer: Self-pay | Admitting: Pharmacist

## 2016-12-31 NOTE — Patient Outreach (Signed)
[facility]) Care Management  12/31/2016  HILMA STEINHILBER [birth year] [id number]  Successful follow-up call to patient.  HIPAA details verified.  Patient reports she has implemented medication changes as discussed with her provider from 12/18/16 note and denies further pharmacy needs at this time.   Plan:  Will close pharmacy case as patient denies other pharmacy related needs at this time.    Karrie Meres, PharmD, [place] [phone]

## 2017-01-02 DIAGNOSIS — J449 Chronic obstructive pulmonary disease, unspecified: Secondary | ICD-10-CM | POA: Diagnosis not present

## 2017-01-19 ENCOUNTER — Other Ambulatory Visit: Payer: Self-pay | Admitting: *Deleted

## 2017-01-19 NOTE — Patient Outreach (Signed)
[facility]) Care Management  01/19/2017  TEQUILA ROTTMANN 1935/04/18 [id number]   Call placed to member as reminder of home visit scheduled for tomorrow.  She requests to cancel, stating that she has been at the hospital daily to spend time with her daughter who is in critical condition.  She request to have follow up after the new year to schedule next appointment.  She does request to have information regarding housing options for seniors.  Will have LCSW send information to member, will follow up in January for home visit.  Valente David, [place], MSN [place] [phone]

## 2017-01-20 ENCOUNTER — Ambulatory Visit: Payer: Self-pay | Admitting: *Deleted

## 2017-01-31 ENCOUNTER — Encounter: Payer: Self-pay | Admitting: *Deleted

## 2017-02-02 DIAGNOSIS — J449 Chronic obstructive pulmonary disease, unspecified: Secondary | ICD-10-CM | POA: Diagnosis not present

## 2017-02-11 ENCOUNTER — Encounter: Payer: Self-pay | Admitting: *Deleted

## 2017-02-11 ENCOUNTER — Other Ambulatory Visit: Payer: Self-pay | Admitting: *Deleted

## 2017-02-11 NOTE — Patient Outreach (Signed)
[facility]) Care Management  02/11/2017  Caitlyn Aguirre [birth year] [id number]   Call placed to member for follow up on current condition and involvement with [facility] per her request during last conversation.  She report that she is still busy with her daughter, stating she is there (in the process of moving her from hospital to nursing home) with her daily and has limited time for [facility] involvement.  She is advised that this care manager will close case at this time.  Also advised to contact this care manager if/when she is ready to proceed with involvement.  She verbalizes understanding.  Will notify care management assistant and primary MD.  Valente David, RN, MSN [place] Manager [phone]

## 2017-03-05 DIAGNOSIS — J449 Chronic obstructive pulmonary disease, unspecified: Secondary | ICD-10-CM | POA: Diagnosis not present

## 2017-03-08 ENCOUNTER — Ambulatory Visit (INDEPENDENT_AMBULATORY_CARE_PROVIDER_SITE_OTHER): Payer: Medicare Other | Admitting: Student in an Organized Health Care Education/Training Program

## 2017-03-08 ENCOUNTER — Encounter: Payer: Self-pay | Admitting: Student in an Organized Health Care Education/Training Program

## 2017-03-08 VITALS — BP 149/75 | HR 75 | Temp 98.4°F | Wt 211.5 lb

## 2017-03-08 DIAGNOSIS — Z791 Long term (current) use of non-steroidal anti-inflammatories (NSAID): Secondary | ICD-10-CM | POA: Diagnosis not present

## 2017-03-08 DIAGNOSIS — M25562 Pain in left knee: Secondary | ICD-10-CM | POA: Diagnosis not present

## 2017-03-08 DIAGNOSIS — Z79899 Other long term (current) drug therapy: Secondary | ICD-10-CM

## 2017-03-08 DIAGNOSIS — Z87891 Personal history of nicotine dependence: Secondary | ICD-10-CM | POA: Diagnosis not present

## 2017-03-08 DIAGNOSIS — Z7984 Long term (current) use of oral hypoglycemic drugs: Secondary | ICD-10-CM

## 2017-03-08 DIAGNOSIS — M25561 Pain in right knee: Secondary | ICD-10-CM

## 2017-03-08 DIAGNOSIS — M159 Polyosteoarthritis, unspecified: Secondary | ICD-10-CM

## 2017-03-08 DIAGNOSIS — I10 Essential (primary) hypertension: Secondary | ICD-10-CM | POA: Diagnosis not present

## 2017-03-08 DIAGNOSIS — M8949 Other hypertrophic osteoarthropathy, multiple sites: Secondary | ICD-10-CM

## 2017-03-08 DIAGNOSIS — E119 Type 2 diabetes mellitus without complications: Secondary | ICD-10-CM | POA: Diagnosis not present

## 2017-03-08 DIAGNOSIS — M150 Primary generalized (osteo)arthritis: Secondary | ICD-10-CM

## 2017-03-08 DIAGNOSIS — Z23 Encounter for immunization: Secondary | ICD-10-CM | POA: Diagnosis not present

## 2017-03-08 LAB — GLUCOSE, CAPILLARY: Glucose-Capillary: 170 mg/dL — ABNORMAL HIGH (ref 65–99)

## 2017-03-08 LAB — POCT GLYCOSYLATED HEMOGLOBIN (HGB A1C): Hemoglobin A1C: 7.3

## 2017-03-08 MED ORDER — LISINOPRIL 40 MG PO TABS: 40.0000 mg | ORAL_TABLET | Freq: Every day | ORAL | 3 refills | Status: DC

## 2017-03-08 MED ORDER — AMLODIPINE BESYLATE 5 MG PO TABS: 5.0000 mg | ORAL_TABLET | Freq: Every day | ORAL | 3 refills | Status: DC

## 2017-03-08 NOTE — Patient Instructions (Signed)
1. Your blood pressure was very high today. Please make sure you take all your medications on the day of your doctors visits. I want to see how your blood pressure looks on those medicines.   2. I increased the lisinopril from 20mg  to 40mg  a day and sent a refill for amlodipine daily.   3. We talked about your arthritis, especially in your knees. Unfortunately there is not an easy solution to your pain. The aleve is a good medicine, but if the pain is still bothering you, or keeping you from doing what you want to do, we talked about a steroid injection into the knee as the next step. I advised against "stronger" pain medications.

## 2017-03-08 NOTE — Assessment & Plan Note (Signed)
Hemoglobin A1c 7.3% which is at goal.  Plan to continue Metformin 1000 mg twice daily.  No need for home fingerstick glucose checks.  She needs retinal scan, unfortunately are retinal camera is currently down.  We will contact her when it is working again to eval for retinopathy.  She has been referred to ophthalmology in the past, but prefers to just do the pictures here.

## 2017-03-08 NOTE — Progress Notes (Signed)
   Assessment and Plan:  See Encounters tab for problem-based medical decision making.   __________________________________________________________  HPI:   82 year old woman here for follow-up of diabetes and hypertension.  She reports pretty good compliance with her medications, though she did not take them this morning.  She continues to get confused about if she should take them on days of her doctor's visit.  She denies any adverse side effects at home.  She also reports confusion with which medicines she needs to pick up from the pharmacy and which need refills.  She been having some difficulty lately because her daughter Phineas Inches) is back in the hospital with a significant illness.  Unfortunately Earlie Server has had a very difficult year with many significant complications.  Lives reports that her mood has been relatively stable even though she is having a lot of this is stress.  __________________________________________________________  Problem List: Patient Active Problem List   Diagnosis Date Noted  . High Risk for Falls 07/18/2015    Priority: High  . COPD ([place]) 01/21/2006    Priority: High  . Diabetes mellitus ([place]) 10/04/1998    Priority: High  . Obstructive sleep apnea 12/25/2011    Priority: Medium  . Hypothyroidism 01/21/2006    Priority: Medium  . Depression 01/21/2006    Priority: Medium  . Essential hypertension 01/21/2006    Priority: Medium  . Osteoarthritis 01/21/2006    Priority: Medium  . GERD (gastroesophageal reflux disease) 06/16/2015    Priority: Low  . Urinary, incontinence, stress female 05/09/2015    Priority: Low  . Insomnia 05/09/2015    Priority: Low  . Eczema of hand 06/21/2013    Priority: Low  . Preventive measure 02/17/2013    Priority: Low    Medications: Reconciled today in Epic __________________________________________________________  Physical Exam:  Vital Signs: Vitals:   03/08/17 0930 03/08/17 0959  BP: (!) 194/90 (!)  149/75  Pulse: 78 75  Temp: 98.4 F (36.9 C)   TempSrc: Oral   SpO2: 96%   Weight: 211 lb 8 oz (95.9 kg)     Gen: Well appearing, NAD Neck: No cervical LAD, No thyromegaly or nodules, No JVD. CV: RRR, no murmurs Pulm: Normal effort, CTA throughout, no wheezing Ext: Warm, no edema, both knees are without effusion, moderate crepitus, no joint laxity, no joint line tenderness. Skin: No atypical appearing moles. No rashes

## 2017-03-08 NOTE — Assessment & Plan Note (Signed)
Blood pressure above goal today likely some significant medication compliance issues.  She did not take any medications today,.  She reports home blood pressure checks also elevated 140-160.  Plan is to increase lisinopril from 20 mg to 40 mg daily, continue amlodipine 5 mg and carvedilol 25 mg twice daily.  I spent a lot of time encouraging compliance and pill box organization.

## 2017-03-08 NOTE — Assessment & Plan Note (Addendum)
At least minus diffuse arthritis in hands, shoulders, elbows, but much worse in both of her knees.  She says that the pain in her knees can sometimes limit her functioning.  She reports minimal benefit from topical or oral NSAIDs.  I offered glucocorticoid injection into the knee, but she is afraid of needles and wants to think about this first.  She is not a good candidate for opioids because of advanced age and adverse effects in the past causing worsening insomnia and confusion.  We also talked about referral to orthopedic surgery, but I do not think we are there yet.  She has had a think about steroid injection and will call me if she wants to schedule out for later this month.

## 2017-04-02 DIAGNOSIS — J449 Chronic obstructive pulmonary disease, unspecified: Secondary | ICD-10-CM | POA: Diagnosis not present

## 2017-04-07 ENCOUNTER — Other Ambulatory Visit: Payer: Self-pay | Admitting: Student in an Organized Health Care Education/Training Program

## 2017-04-07 DIAGNOSIS — J438 Other emphysema: Secondary | ICD-10-CM

## 2017-04-13 ENCOUNTER — Encounter (HOSPITAL_COMMUNITY): Payer: Self-pay | Admitting: Emergency Medicine

## 2017-04-13 ENCOUNTER — Ambulatory Visit (HOSPITAL_COMMUNITY)
Admission: EM | Admit: 2017-04-13 | Discharge: 2017-04-13 | Disposition: A | Discharge status: Home / Self Care | Payer: Medicare Other | Attending: Family Medicine | Admitting: Family Medicine

## 2017-04-13 DIAGNOSIS — M1990 Unspecified osteoarthritis, unspecified site: Secondary | ICD-10-CM

## 2017-04-13 MED ORDER — TRAMADOL HCL 50 MG PO TABS: 50.0000 mg | ORAL_TABLET | Freq: Four times a day (QID) | ORAL | 0 refills | Status: DC | PRN

## 2017-04-13 MED ORDER — PREDNISONE 10 MG (21) PO TBPK: ORAL_TABLET | Freq: Every day | ORAL | 0 refills | Status: DC

## 2017-04-13 NOTE — ED Triage Notes (Signed)
PT reports generalized body aches. PT has known arthritis. PT reports bilateral knees and left hand are the worst.
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
## 2017-05-03 DIAGNOSIS — J449 Chronic obstructive pulmonary disease, unspecified: Secondary | ICD-10-CM | POA: Diagnosis not present

## 2017-06-02 DIAGNOSIS — J449 Chronic obstructive pulmonary disease, unspecified: Secondary | ICD-10-CM | POA: Diagnosis not present

## 2017-06-09 DIAGNOSIS — E119 Type 2 diabetes mellitus without complications: Secondary | ICD-10-CM | POA: Diagnosis not present

## 2017-06-09 DIAGNOSIS — Z961 Presence of intraocular lens: Secondary | ICD-10-CM | POA: Diagnosis not present

## 2017-06-09 DIAGNOSIS — H40013 Open angle with borderline findings, low risk, bilateral: Secondary | ICD-10-CM | POA: Diagnosis not present

## 2017-06-09 LAB — HM DIABETES EYE EXAM

## 2017-06-10 ENCOUNTER — Encounter: Payer: Self-pay | Admitting: *Deleted

## 2017-06-15 ENCOUNTER — Encounter: Payer: Self-pay | Admitting: Student in an Organized Health Care Education/Training Program

## 2017-07-03 DIAGNOSIS — J449 Chronic obstructive pulmonary disease, unspecified: Secondary | ICD-10-CM | POA: Diagnosis not present

## 2017-08-02 DIAGNOSIS — J449 Chronic obstructive pulmonary disease, unspecified: Secondary | ICD-10-CM | POA: Diagnosis not present

## 2017-09-02 DIAGNOSIS — J449 Chronic obstructive pulmonary disease, unspecified: Secondary | ICD-10-CM | POA: Diagnosis not present

## 2017-10-03 DIAGNOSIS — J449 Chronic obstructive pulmonary disease, unspecified: Secondary | ICD-10-CM | POA: Diagnosis not present

## 2017-10-11 IMAGING — CT CT CHEST W/ CM
1 of 4 series · 3 of 36 positions shown, 4 images · IV contrast (omnipaque)
Comparison: Chest CT 02/27/2004;

Correlation made with chest radiograph 12/03/2014

CLINICAL DATA: Abnormal chest radiograph with RIGHT hilar
prominence and question pulmonary nodule LEFT upper lobe

EXAM:
CT CHEST WITH CONTRAST
TECHNIQUE: Multidetector CT imaging of the chest was performed during
intravenous contrast administration. Sagittal and coronal MPR images
reconstructed from axial data set.
CONTRAST:  75mL OMNIPAQUE IOHEXOL 300 MG/ML  SOLN tiny

[Series 204: coronal · coronal · 0.45mm/px · 3 of 80 slices shown, 4 images]
[im 16/80  mediastinal]
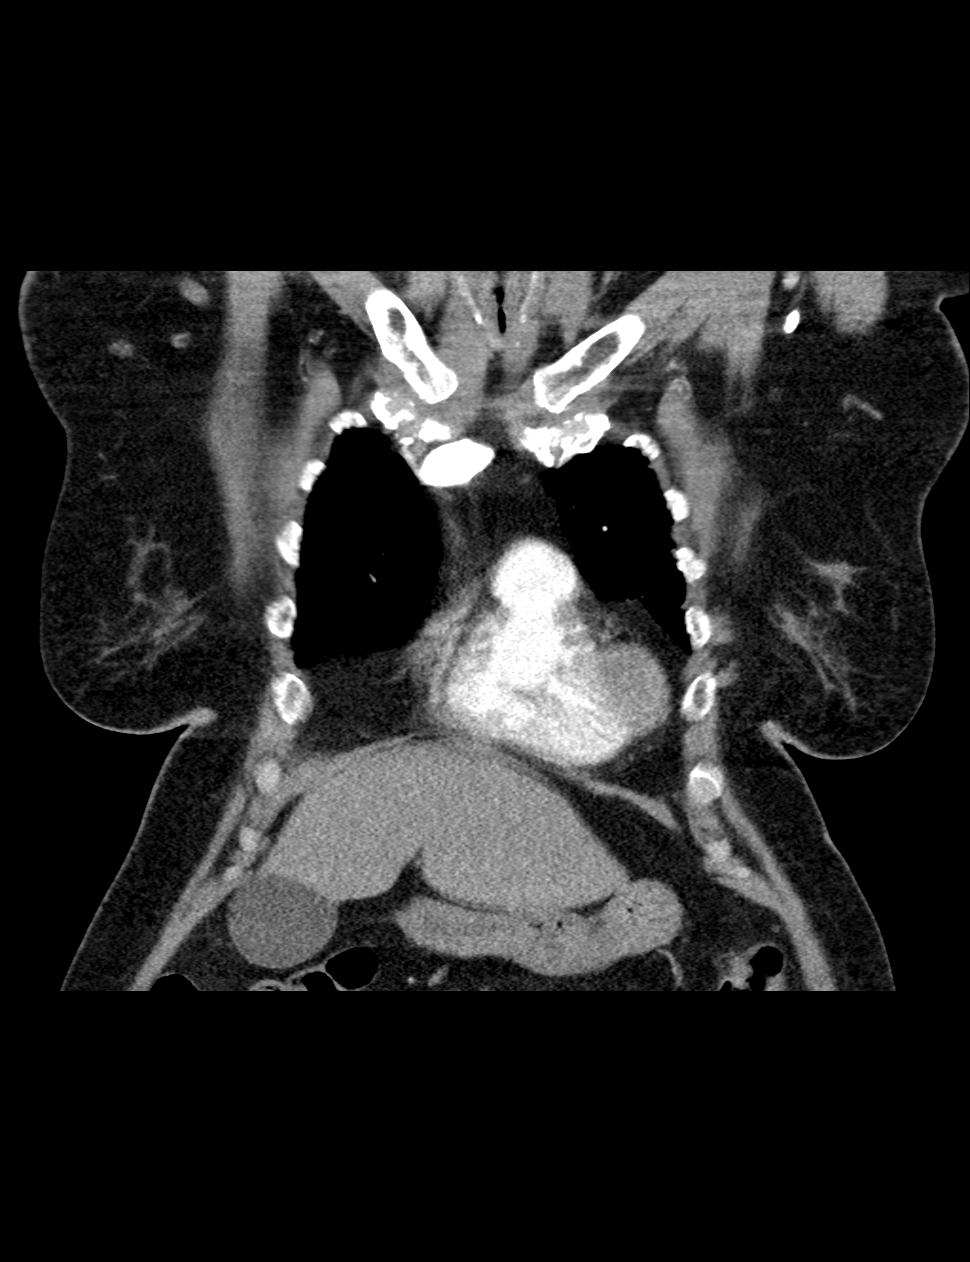
[im 16/80  lung]
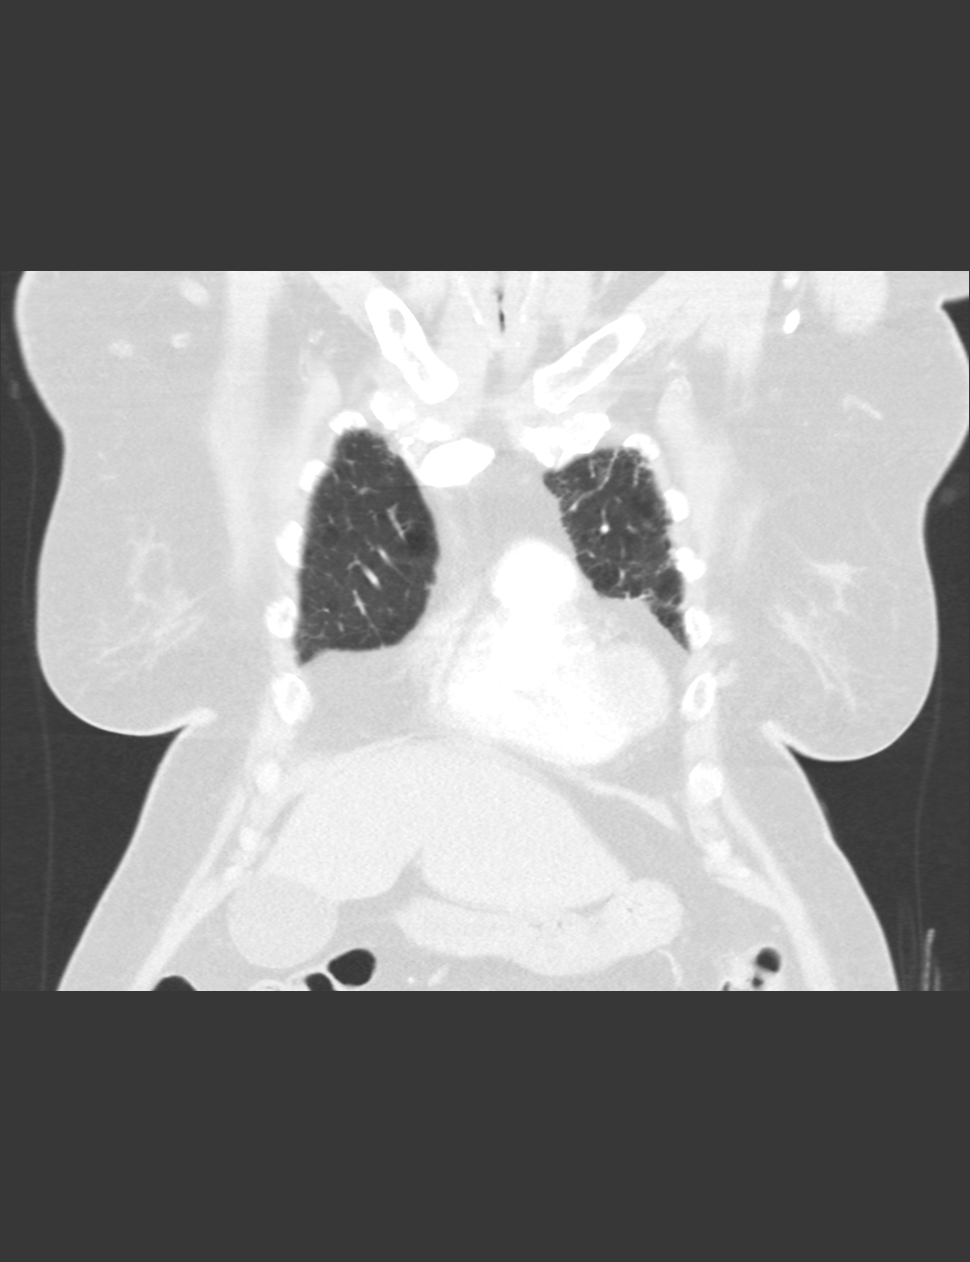
[im 48/80  lung]
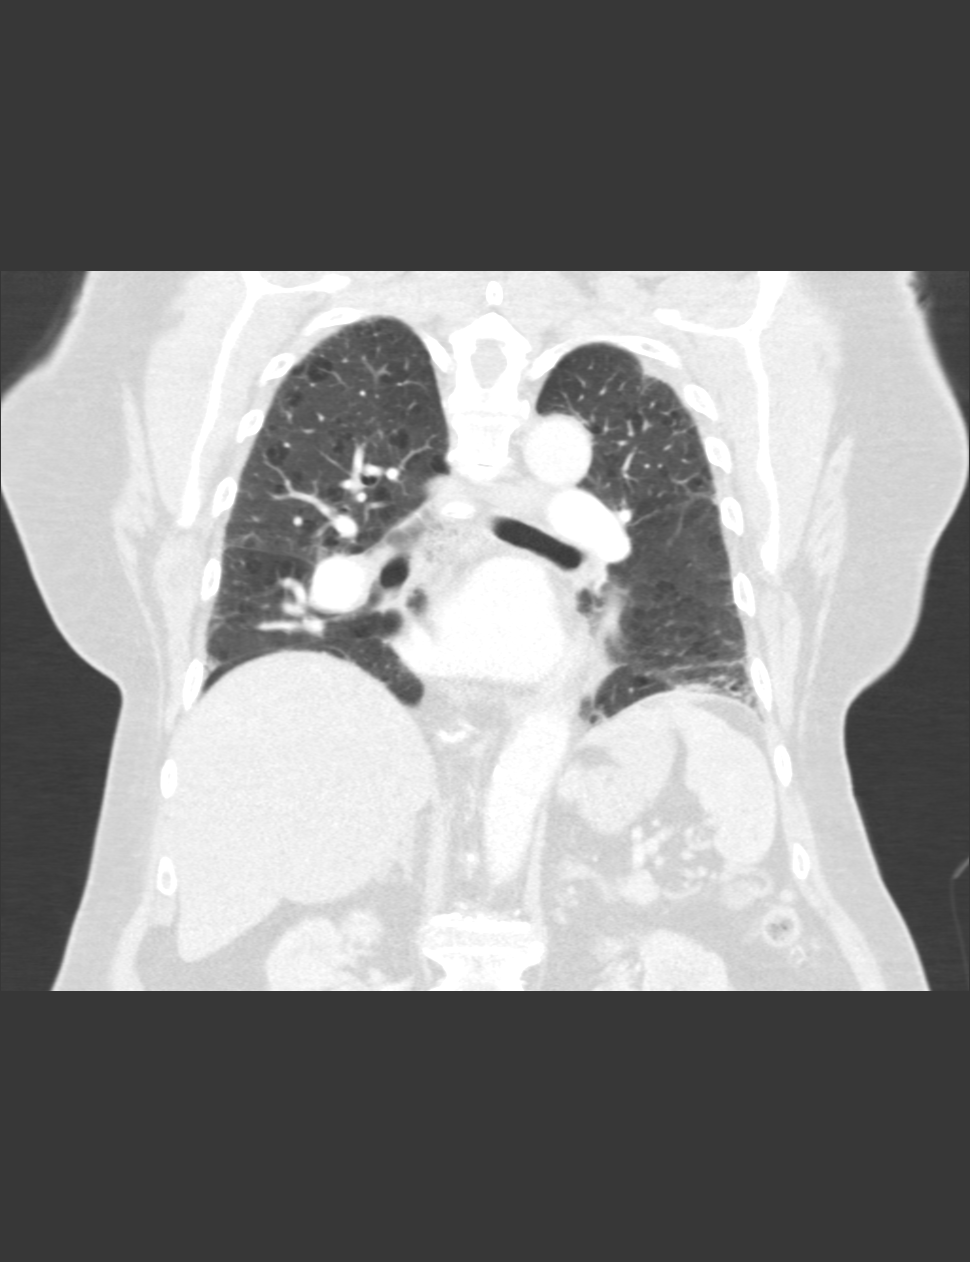
[im 64/80  lung]
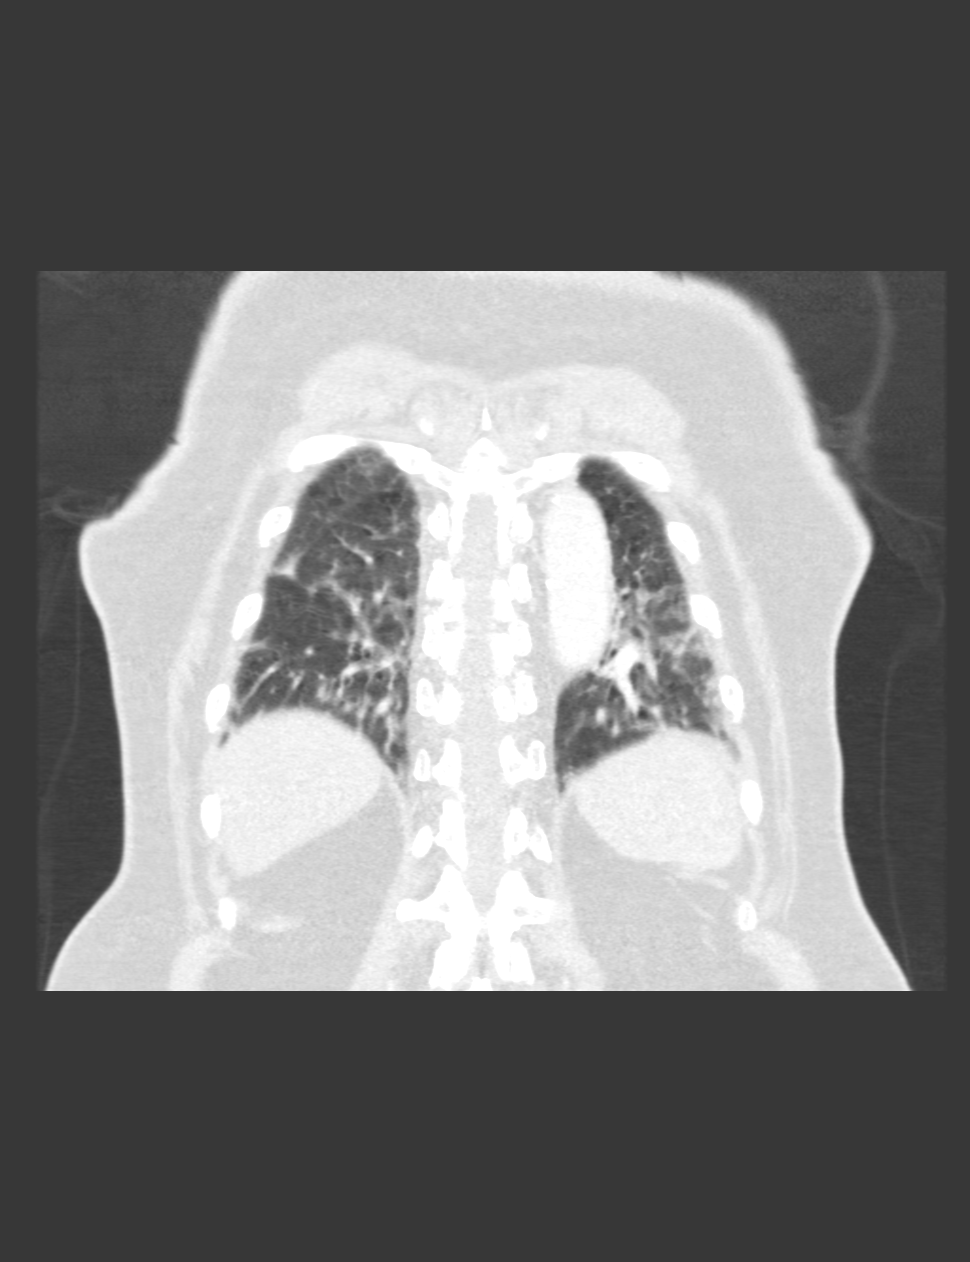

[3 of 36 positions shown; findings below may reference images not displayed]

FINDINGS: Minimal atherosclerotic calcification aorta and coronary arteries.

Tortuous great vessels.

Vascular structures grossly patent on nondedicated exam.

Prominence of RIGHT hilum is due to prominent RIGHT pulmonary artery
course.

No thoracic adenopathy.

Visualized upper abdomen normal.

Scattered subsegmental atelectasis bilaterally, chronic.

Scattered subpleural interstitial changes, chronic.

Minimal central peribronchial thickening with underlying
emphysematous changes.

4 mm RIGHT upper lobe nodule image 21 unchanged.

No acute infiltrate, pleural effusion, pneumothorax or additional
mass/nodule.

Old healed mid sternal fracture.

Degenerative disc disease changes thoracic spine.
IMPRESSION: Chronic lung changes as above.

Prominent RIGHT pulmonary artery accounting for RIGHT hilar
enlargement.

No acute abnormalities.

## 2017-10-21 DIAGNOSIS — R0789 Other chest pain: Secondary | ICD-10-CM | POA: Diagnosis not present

## 2017-10-21 DIAGNOSIS — I10 Essential (primary) hypertension: Secondary | ICD-10-CM | POA: Diagnosis not present

## 2017-10-25 ENCOUNTER — Encounter (HOSPITAL_COMMUNITY): Payer: Self-pay | Admitting: Emergency Medicine

## 2017-10-25 ENCOUNTER — Ambulatory Visit (HOSPITAL_COMMUNITY)
Admission: EM | Admit: 2017-10-25 | Discharge: 2017-10-25 | Disposition: A | Discharge status: Home / Self Care | Payer: Medicare Other | Attending: Family Medicine | Admitting: Family Medicine

## 2017-10-25 ENCOUNTER — Ambulatory Visit (INDEPENDENT_AMBULATORY_CARE_PROVIDER_SITE_OTHER): Payer: Medicare Other

## 2017-10-25 DIAGNOSIS — M25561 Pain in right knee: Secondary | ICD-10-CM

## 2017-10-25 DIAGNOSIS — W19XXXA Unspecified fall, initial encounter: Secondary | ICD-10-CM

## 2017-10-25 DIAGNOSIS — S20211A Contusion of right front wall of thorax, initial encounter: Secondary | ICD-10-CM | POA: Diagnosis not present

## 2017-10-25 DIAGNOSIS — R0781 Pleurodynia: Secondary | ICD-10-CM | POA: Diagnosis not present

## 2017-10-25 DIAGNOSIS — M25461 Effusion, right knee: Secondary | ICD-10-CM | POA: Diagnosis not present

## 2017-10-25 DIAGNOSIS — S299XXA Unspecified injury of thorax, initial encounter: Secondary | ICD-10-CM | POA: Diagnosis not present

## 2017-10-25 MED ORDER — DICLOFENAC SODIUM 1 % TD GEL
2.0000 g | Freq: Four times a day (QID) | TRANSDERMAL | 0 refills | Status: DC
Start: 2017-10-25 — End: 2017-11-29

## 2017-10-25 MED ORDER — ACETAMINOPHEN 500 MG PO TABS: 500.0000 mg | ORAL_TABLET | Freq: Four times a day (QID) | ORAL | 0 refills | Status: DC | PRN
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
## 2017-10-25 NOTE — ED Triage Notes (Signed)
Pt sts right sided rib and side pain after falling over a child on Thursday; pt sts seen by PCP and given meds but still having pain and bruising noted to right side

## 2017-10-25 NOTE — Discharge Instructions (Addendum)
Ice application to ribs.  May continue with the tramadol as needed for pain.  Tylenol regularly to help with pain.  Topical gel to help with right knee pain.  Activity as tolerated.  Force cough and deep breathing at least every hour.  Please follow up with your primary care provider for recheck in the next week.  If develop worsening of pain, fever, shortness of breath , difficulty breathing or otherwise worsening please return to be seen.

## 2017-11-02 DIAGNOSIS — J449 Chronic obstructive pulmonary disease, unspecified: Secondary | ICD-10-CM | POA: Diagnosis not present

## 2017-11-29 ENCOUNTER — Encounter (INDEPENDENT_AMBULATORY_CARE_PROVIDER_SITE_OTHER): Payer: Self-pay

## 2017-11-29 ENCOUNTER — Encounter: Payer: Self-pay | Admitting: Student in an Organized Health Care Education/Training Program

## 2017-11-29 ENCOUNTER — Ambulatory Visit (INDEPENDENT_AMBULATORY_CARE_PROVIDER_SITE_OTHER): Payer: Medicare Other | Admitting: Student in an Organized Health Care Education/Training Program

## 2017-11-29 ENCOUNTER — Other Ambulatory Visit: Payer: Self-pay

## 2017-11-29 ENCOUNTER — Encounter: Payer: Self-pay | Admitting: *Deleted

## 2017-11-29 VITALS — BP 165/92 | HR 75 | Temp 97.6°F | Ht 68.0 in | Wt 210.4 lb

## 2017-11-29 DIAGNOSIS — Z79899 Other long term (current) drug therapy: Secondary | ICD-10-CM

## 2017-11-29 DIAGNOSIS — E119 Type 2 diabetes mellitus without complications: Secondary | ICD-10-CM | POA: Diagnosis not present

## 2017-11-29 DIAGNOSIS — E1169 Type 2 diabetes mellitus with other specified complication: Secondary | ICD-10-CM | POA: Diagnosis not present

## 2017-11-29 DIAGNOSIS — Z23 Encounter for immunization: Secondary | ICD-10-CM | POA: Diagnosis not present

## 2017-11-29 DIAGNOSIS — F329 Major depressive disorder, single episode, unspecified: Secondary | ICD-10-CM

## 2017-11-29 DIAGNOSIS — F32A Depression, unspecified: Secondary | ICD-10-CM

## 2017-11-29 DIAGNOSIS — E2839 Other primary ovarian failure: Secondary | ICD-10-CM

## 2017-11-29 DIAGNOSIS — G4700 Insomnia, unspecified: Secondary | ICD-10-CM

## 2017-11-29 DIAGNOSIS — E039 Hypothyroidism, unspecified: Secondary | ICD-10-CM | POA: Diagnosis not present

## 2017-11-29 DIAGNOSIS — I10 Essential (primary) hypertension: Secondary | ICD-10-CM

## 2017-11-29 DIAGNOSIS — E559 Vitamin D deficiency, unspecified: Secondary | ICD-10-CM | POA: Diagnosis not present

## 2017-11-29 DIAGNOSIS — Z9181 History of falling: Secondary | ICD-10-CM

## 2017-11-29 DIAGNOSIS — Z79890 Hormone replacement therapy (postmenopausal): Secondary | ICD-10-CM

## 2017-11-29 LAB — POCT GLYCOSYLATED HEMOGLOBIN (HGB A1C): Hemoglobin A1C: 7.7 % — AB (ref 4.0–5.6)

## 2017-11-29 LAB — GLUCOSE, CAPILLARY: Glucose-Capillary: 162 mg/dL — ABNORMAL HIGH (ref 70–99)

## 2017-11-29 MED ORDER — CHLORTHALIDONE 25 MG PO TABS: 25.0000 mg | ORAL_TABLET | Freq: Every day | ORAL | 2 refills | Status: DC

## 2017-11-29 NOTE — Assessment & Plan Note (Signed)
Blood pressure elevated above goal, despite increasing ACE inhibitor at last visit.  Plan is to start chlorthalidone 25 mg daily.  Continue lisinopril 40 mg daily, amlodipine 5 mg daily, and carvedilol 25mg  bid. Follow up in 1 month for BP recheck and labs.

## 2017-11-29 NOTE — Progress Notes (Signed)
   Assessment and Plan:  See Encounters tab for problem-based medical decision making.   __________________________________________________________  HPI:   82 year old woman here for follow-up of diabetes and hypertension.  Has been struggling since her daughter passed away in 09/11/22.  She spent the last year being the caretaker to her daughter who was very sick for a long time.  In and out of the hospital, and out of rehabs.  Still trouble with insomnia.  Functionally doing okay, lives independently.  Reports good compliance with her medications, denies any adverse side effects.  Says she took her medications yesterday, still not take them this morning.  Had a fall over the weekend while she was at a barbecue.  She hurt her left elbow, but not bad enough that she had to go to emergency department.  Doing okay today.  No fevers or chills.  No chest pain or shortness of breath.  __________________________________________________________  Problem List: Patient Active Problem List   Diagnosis Date Noted  . High Risk for Falls 07/18/2015    Priority: High  . COPD ([place]) 01/21/2006    Priority: High  . Type 2 diabetes mellitus with other specified complication ([place]) 56/31/4970    Priority: High  . Obstructive sleep apnea 12/25/2011    Priority: Medium  . Hypothyroidism 01/21/2006    Priority: Medium  . Depression 01/21/2006    Priority: Medium  . Essential hypertension 01/21/2006    Priority: Medium  . Osteoarthritis 01/21/2006    Priority: Medium  . GERD (gastroesophageal reflux disease) 06/16/2015    Priority: Low  . Urinary, incontinence, stress female 05/09/2015    Priority: Low  . Insomnia 05/09/2015    Priority: Low  . Eczema of hand 06/21/2013    Priority: Low  . Preventive measure 02/17/2013    Priority: Low  . Estrogen deficiency 11/29/2017    Medications: Reconciled today in Epic __________________________________________________________  Physical Exam:  Vital  Signs: Vitals:   11/29/17 1020 11/29/17 1023  BP: (!) 185/104 (!) 165/92  Pulse: 74 75  Temp: 97.6 F (36.4 C)   TempSrc: Oral   SpO2: 93%   Weight: 210 lb 6.4 oz (95.4 kg)   Height: 5\' 8"  (1.727 m)     Gen: Well appearing, NAD Neck: No cervical LAD, No thyromegaly or nodules, No JVD. CV: RRR, no murmurs Pulm: Normal effort, CTA throughout, no wheezing Ext: Warm, no edema, normal joints Psych: Depressed appearing affect, very agreeable and pleasant

## 2017-11-29 NOTE — Assessment & Plan Note (Signed)
Last DEXA was 2010, normal.  Will repeat a DEXA today given increased falls recently.

## 2017-11-29 NOTE — Assessment & Plan Note (Signed)
Annual TSH today.  Continue with Synthroid 88 mcg daily.

## 2017-11-29 NOTE — Addendum Note (Signed)
Addended by: Lalla Brothers T on: 11/29/2017 11:05 AM   Modules accepted: Orders

## 2017-11-29 NOTE — Patient Instructions (Signed)
Today we talked about your blood pressure which was elevated.  You should continue your medications and start a new medication called chlorthalidone 25 mg once a day.  I have [facility] to [facility].  Please follow-up with [place] in 1 month so we can recheck your blood pressure and recheck your blood work.

## 2017-11-30 ENCOUNTER — Telehealth: Payer: Self-pay | Admitting: Student in an Organized Health Care Education/Training Program

## 2017-11-30 LAB — BMP8+ANION GAP
Anion Gap: 15 mmol/L (ref 10.0–18.0)
BUN/Creatinine Ratio: 16 (ref 12–28)
BUN: 14 mg/dL (ref 8–27)
CO2: 22 mmol/L (ref 20–29)
Calcium: 10.2 mg/dL (ref 8.7–10.3)
Chloride: 107 mmol/L — ABNORMAL HIGH (ref 96–106)
Creatinine, Ser: 0.88 mg/dL (ref 0.57–1.00)
GFR calc Af Amer: 72 mL/min/{1.73_m2} (ref >59)
GFR calc non Af Amer: 62 mL/min/{1.73_m2} (ref >59)
Glucose: 175 mg/dL — ABNORMAL HIGH (ref 65–99)
Potassium: 4.1 mmol/L (ref 3.5–5.2)
Sodium: 144 mmol/L (ref 134–144)

## 2017-11-30 LAB — LIPID PANEL
Chol/HDL Ratio: 3.3 ratio (ref 0.0–4.4)
Cholesterol, Total: 188 mg/dL (ref 100–199)
HDL: 57 mg/dL (ref >39)
LDL Calculated: 108 mg/dL — ABNORMAL HIGH (ref 0–99)
Triglycerides: 113 mg/dL (ref 0–149)
VLDL Cholesterol Cal: 23 mg/dL (ref 5–40)

## 2017-11-30 LAB — TSH: TSH: 2.41 u[IU]/mL (ref 0.450–4.500)

## 2017-11-30 LAB — VITAMIN D 25 HYDROXY (VIT D DEFICIENCY, FRACTURES): Vit D, 25-Hydroxy: 11.5 ng/mL — ABNORMAL LOW (ref 30.0–100.0)

## 2017-11-30 MED ORDER — CHOLECALCIFEROL 1.25 MG (50000 UT) PO TABS: 1.0000 | ORAL_TABLET | ORAL | 0 refills | Status: DC

## 2017-11-30 NOTE — Telephone Encounter (Signed)
Spoke with patient about labs. All ok except vitamin D deficiency. Given symptoms of fatigue, weakness, and falls, I am going to be aggressive in repletion and start cholecalciferol 50,000 units once weekly for 8 weeks. I explained this to the patient and she understands. After that is finished, we can recheck 25 D level, and if over 30 we can transition to 2000 units daily maintenance.

## 2017-12-01 ENCOUNTER — Telehealth: Payer: Self-pay | Admitting: Student in an Organized Health Care Education/Training Program

## 2017-12-01 NOTE — Telephone Encounter (Signed)
Patient requesting a call back about what new medicine she should be taking.

## 2017-12-01 NOTE — Telephone Encounter (Signed)
Return call to patient:  Pt asking if b/p medication and "once a week" medication has been called into pharmacy.  Pt informed new b/p med and Vit D RX have been sent to [facility] per Dr. Evette Doffing.  Pt states that is fine.  Pt instructed if she does not receive meds in a couple of days, to call back, she verbalizes understanding.

## 2017-12-02 ENCOUNTER — Other Ambulatory Visit: Payer: Self-pay | Admitting: Student in an Organized Health Care Education/Training Program

## 2017-12-02 NOTE — Telephone Encounter (Signed)
Returned call to patient, no answer.  Left message to call RN back.  SChaplin,RN   

## 2017-12-02 NOTE — Telephone Encounter (Signed)
Needs refill blood pressure medicine, she is unsure what the other name of medicine, she is suppose to take it for 30 days [facility] mail [facility]  ; pt contact [phone]

## 2017-12-03 ENCOUNTER — Other Ambulatory Visit: Payer: Self-pay | Admitting: *Deleted

## 2017-12-03 ENCOUNTER — Other Ambulatory Visit: Payer: Self-pay | Admitting: Student in an Organized Health Care Education/Training Program

## 2017-12-03 ENCOUNTER — Telehealth: Payer: Self-pay | Admitting: *Deleted

## 2017-12-03 DIAGNOSIS — E039 Hypothyroidism, unspecified: Secondary | ICD-10-CM

## 2017-12-03 DIAGNOSIS — I10 Essential (primary) hypertension: Secondary | ICD-10-CM

## 2017-12-03 DIAGNOSIS — R109 Unspecified abdominal pain: Secondary | ICD-10-CM

## 2017-12-03 DIAGNOSIS — K219 Gastro-esophageal reflux disease without esophagitis: Secondary | ICD-10-CM

## 2017-12-03 MED ORDER — CHOLECALCIFEROL 1.25 MG (50000 UT) PO CAPS: 50000.0000 [IU] | ORAL_CAPSULE | ORAL | 0 refills | Status: AC

## 2017-12-03 NOTE — Telephone Encounter (Signed)
The vitamin D is not an urgent issue. This deficiency is not making her sick. It sounds like something else may be going on. If she is not feeling better we should offer her an [facility] appointment for an evaluation.

## 2017-12-03 NOTE — Telephone Encounter (Signed)
Call from [place] [facility] only comes in capsules not tablets. Please change and re-send . Thanks

## 2017-12-03 NOTE — Telephone Encounter (Signed)
Call from pt's daughter who is upset; stated her mother is very sick and she needs her medicine. I asked her which med; stated the one her doctor told her mother she needs to take once a week. Stated she had called [facility] and now waiting on the doctor. "Something about a capsule; I don's know". Told daughter I talked to [facility] this morning; I was told Vit D comes in cap form. The daughter stated " Vit D" - hear someone (maybe pt) stated yea that's right. And Dr Evette Doffing has already changed and sent rx to the pharmacy. And to call the pharmacy which she said she will" to let them put a rush on it". Then she apologized for raising her voice. Stated she has already lost a sister and doesn't want to loose her mother.

## 2017-12-03 NOTE — Telephone Encounter (Signed)
Next appt scheduled 11/25 with PCP.

## 2017-12-06 ENCOUNTER — Telehealth: Payer: Self-pay | Admitting: *Deleted
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
## 2017-12-06 NOTE — Telephone Encounter (Signed)
Pt's daughter called very angry, calmed her, she wants an initial script sent to [facility] for VIT D, she states the mail order does not know when they will ship it, pt believes she is getting very sick because she does not have this, will you please send a script to [facility] for VIT D. Thanks!

## 2017-12-08 ENCOUNTER — Other Ambulatory Visit: Payer: Self-pay | Admitting: Student in an Organized Health Care Education/Training Program

## 2017-12-08 DIAGNOSIS — Z1231 Encounter for screening mammogram for malignant neoplasm of breast: Secondary | ICD-10-CM

## 2017-12-13 NOTE — Telephone Encounter (Signed)
Refills addressed on 12/03/17 encounter.  SChaplin, RN,BSN

## 2017-12-27 ENCOUNTER — Other Ambulatory Visit: Payer: Self-pay

## 2017-12-27 ENCOUNTER — Encounter (INDEPENDENT_AMBULATORY_CARE_PROVIDER_SITE_OTHER): Payer: Self-pay

## 2017-12-27 ENCOUNTER — Encounter: Payer: Self-pay | Admitting: Student in an Organized Health Care Education/Training Program

## 2017-12-27 ENCOUNTER — Ambulatory Visit (INDEPENDENT_AMBULATORY_CARE_PROVIDER_SITE_OTHER): Payer: Medicare Other | Admitting: Student in an Organized Health Care Education/Training Program

## 2017-12-27 VITALS — BP 141/71 | HR 79 | Temp 98.0°F | Ht 68.0 in | Wt 208.9 lb

## 2017-12-27 DIAGNOSIS — I10 Essential (primary) hypertension: Secondary | ICD-10-CM

## 2017-12-27 DIAGNOSIS — N3941 Urge incontinence: Secondary | ICD-10-CM

## 2017-12-27 DIAGNOSIS — Z79899 Other long term (current) drug therapy: Secondary | ICD-10-CM

## 2017-12-27 DIAGNOSIS — M1990 Unspecified osteoarthritis, unspecified site: Secondary | ICD-10-CM | POA: Diagnosis not present

## 2017-12-27 DIAGNOSIS — F329 Major depressive disorder, single episode, unspecified: Secondary | ICD-10-CM

## 2017-12-27 DIAGNOSIS — F32A Depression, unspecified: Secondary | ICD-10-CM

## 2017-12-27 MED ORDER — IBUPROFEN 600 MG PO TABS: 600.0000 mg | ORAL_TABLET | Freq: Every day | ORAL | 2 refills | Status: DC | PRN

## 2017-12-27 MED ORDER — VITAMIN D3 10 MCG (400 UNIT) PO TABS: 400.0000 [IU] | ORAL_TABLET | Freq: Every day | ORAL | 3 refills | Status: DC

## 2017-12-27 NOTE — Assessment & Plan Note (Addendum)
Symptomatically stable, still struggling with grief over her daughter's passing.  Plan to continue with paroxetine 30 mg daily.  May benefit from counseling in the future.

## 2017-12-27 NOTE — Patient Instructions (Signed)
You are doing a good job with all your medications.  Your blood pressure looks much much better today.  Continue to take these medicines as prescribed.  For your arthritis I sent in a prescription strength of ibuprofen.  Take 1 tablet as needed on the very bad days.  Unfortunately we cannot make the pain go away, but we can use medicines or procedures to try to help your functioning.  We talked about your vitamin D level today.  I am glad you got the medication, I am sorry for the difficulties obtaining it.  When you are finished with the once weekly medication, you may start a once daily dose of vitamin D.  I sent this to your pharmacy for you to start at the end of December.

## 2017-12-27 NOTE — Assessment & Plan Note (Signed)
Blood pressure rechecked today, looks much better.  141/80.  Plan is to continue with chlorthalidone 25 mg daily, lisinopril 40 mg, and amlodipine 5 mg daily.  Will recheck a BMP today to ensure no side effects from the chlorthalidone.

## 2017-12-27 NOTE — Progress Notes (Signed)
   Assessment and Plan:  See Encounters tab for problem-based medical decision making.   __________________________________________________________  HPI:   82 year old woman here for follow-up of hypertension.  At her last visit we started chlorthalidone.  She reports good compliance, denies any adverse side effects.  Blood pressure looks better today.  No chest pain or shortness of breath.  Has not affected her urinary urge incontinence.  She reports her fatigue is about the same.  She is dealing with depression that worsened by acute after her daughter died earlier this year.  Lives with her other daughter, has some good support from this.  She still worries a lot.  Tearful much of the day.  Also complains of persistent pain in both of her knees and shoulders.  Pain consistent with known osteoarthritis.  Says the pain does not prevent her from walking or doing chores.  She still likes to get around the house.  She does all of her activities of daily living independently.  Uses ibuprofen and Tylenol as needed for the pain.  Not interested in steroid injections or surgeries at this point.  __________________________________________________________  Problem List: Patient Active Problem List   Diagnosis Date Noted  . High Risk for Falls 07/18/2015    Priority: High  . Depression 01/21/2006    Priority: High  . Essential hypertension 01/21/2006    Priority: High  . Type 2 diabetes mellitus with other specified complication ([place]) 53/61/4431    Priority: High  . Obstructive sleep apnea 12/25/2011    Priority: Medium  . Hypothyroidism 01/21/2006    Priority: Medium  . COPD ([place]) 01/21/2006    Priority: Medium  . Osteoarthritis 01/21/2006    Priority: Medium  . GERD (gastroesophageal reflux disease) 06/16/2015    Priority: Low  . Urinary, incontinence, stress female 05/09/2015    Priority: Low  . Insomnia 05/09/2015    Priority: Low  . Eczema of hand 06/21/2013    Priority: Low    . Preventive measure 02/17/2013    Priority: Low    Medications: Reconciled today in Epic __________________________________________________________  Physical Exam:  Vital Signs: Vitals:   12/27/17 1050  BP: (!) 141/71  Pulse: 79  Temp: 98 F (36.7 C)  TempSrc: Oral  SpO2: 100%  Weight: 208 lb 14.4 oz (94.8 kg)  Height: 5\' 8"  (1.727 m)    Gen: Well appearing, NAD CV: RRR, no murmurs Pulm: Normal effort, CTA throughout, no wheezing Ext: Warm, no edema, small effusions in both of her knees, no crepitus, no joint laxity

## 2017-12-28 ENCOUNTER — Encounter: Payer: Self-pay | Admitting: Student in an Organized Health Care Education/Training Program

## 2017-12-28 LAB — BMP8+ANION GAP
Anion Gap: 16 mmol/L (ref 10.0–18.0)
BUN/Creatinine Ratio: 13 (ref 12–28)
BUN: 15 mg/dL (ref 8–27)
CO2: 21 mmol/L (ref 20–29)
Calcium: 9.9 mg/dL (ref 8.7–10.3)
Chloride: 104 mmol/L (ref 96–106)
Creatinine, Ser: 1.19 mg/dL — ABNORMAL HIGH (ref 0.57–1.00)
GFR calc Af Amer: 50 mL/min/{1.73_m2} — ABNORMAL LOW (ref >59)
GFR calc non Af Amer: 43 mL/min/{1.73_m2} — ABNORMAL LOW (ref >59)
Glucose: 209 mg/dL — ABNORMAL HIGH (ref 65–99)
Potassium: 4.8 mmol/L (ref 3.5–5.2)
Sodium: 141 mmol/L (ref 134–144)

## 2018-01-09 ENCOUNTER — Ambulatory Visit (INDEPENDENT_AMBULATORY_CARE_PROVIDER_SITE_OTHER): Payer: Medicare Other

## 2018-01-09 ENCOUNTER — Ambulatory Visit (HOSPITAL_COMMUNITY)
Admission: EM | Admit: 2018-01-09 | Discharge: 2018-01-09 | Disposition: A | Discharge status: Home / Self Care | Payer: Medicare Other | Attending: Family Medicine | Admitting: Family Medicine

## 2018-01-09 ENCOUNTER — Encounter (HOSPITAL_COMMUNITY): Payer: Self-pay | Admitting: Emergency Medicine

## 2018-01-09 DIAGNOSIS — R05 Cough: Secondary | ICD-10-CM | POA: Diagnosis not present

## 2018-01-09 DIAGNOSIS — R51 Headache: Secondary | ICD-10-CM | POA: Diagnosis not present

## 2018-01-09 DIAGNOSIS — R6889 Other general symptoms and signs: Secondary | ICD-10-CM | POA: Diagnosis not present

## 2018-01-09 DIAGNOSIS — R5383 Other fatigue: Secondary | ICD-10-CM | POA: Diagnosis not present

## 2018-01-09 DIAGNOSIS — B9689 Other specified bacterial agents as the cause of diseases classified elsewhere: Secondary | ICD-10-CM | POA: Diagnosis not present

## 2018-01-09 DIAGNOSIS — J441 Chronic obstructive pulmonary disease with (acute) exacerbation: Secondary | ICD-10-CM

## 2018-01-09 DIAGNOSIS — M7918 Myalgia, other site: Secondary | ICD-10-CM | POA: Diagnosis not present

## 2018-01-09 LAB — POCT I-STAT, CHEM 8
BUN: 14 mg/dL (ref 8–23)
Calcium, Ion: 1.26 mmol/L (ref 1.15–1.40)
Chloride: 105 mmol/L (ref 98–111)
Creatinine, Ser: 1 mg/dL (ref 0.44–1.00)
Glucose, Bld: 156 mg/dL — ABNORMAL HIGH (ref 70–99)
HCT: 36 % (ref 36.0–46.0)
Hemoglobin: 12.2 g/dL (ref 12.0–15.0)
Potassium: 4.2 mmol/L (ref 3.5–5.1)
Sodium: 138 mmol/L (ref 135–145)
TCO2: 25 mmol/L (ref 22–32)

## 2018-01-09 MED ORDER — KETOROLAC TROMETHAMINE 30 MG/ML IJ SOLN
30.0000 mg | Freq: Once | INTRAMUSCULAR | Status: AC
  Administered 2018-01-09: 30 mg via INTRAMUSCULAR

## 2018-01-09 MED ORDER — ALBUTEROL SULFATE HFA 108 (90 BASE) MCG/ACT IN AERS: 1.0000 | INHALATION_SPRAY | Freq: Four times a day (QID) | RESPIRATORY_TRACT | 0 refills | Status: DC | PRN

## 2018-01-09 MED ORDER — CEFTRIAXONE SODIUM 1 G IJ SOLR
1.0000 g | Freq: Once | INTRAMUSCULAR | Status: AC
  Administered 2018-01-09: 1 g via INTRAMUSCULAR

## 2018-01-09 MED ORDER — IPRATROPIUM-ALBUTEROL 0.5-2.5 (3) MG/3ML IN SOLN
RESPIRATORY_TRACT | Status: AC
  Filled 2018-01-09: qty 3

## 2018-01-09 MED ORDER — LIDOCAINE HCL (PF) 1 % IJ SOLN
INTRAMUSCULAR | Status: AC
  Filled 2018-01-09: qty 2

## 2018-01-09 MED ORDER — ALBUTEROL SULFATE (2.5 MG/3ML) 0.083% IN NEBU: 2.5000 mg | INHALATION_SOLUTION | RESPIRATORY_TRACT | 0 refills | Status: DC | PRN

## 2018-01-09 MED ORDER — METHYLPREDNISOLONE SODIUM SUCC 125 MG IJ SOLR
80.0000 mg | Freq: Once | INTRAMUSCULAR | Status: AC
  Administered 2018-01-09: 80 mg via INTRAMUSCULAR

## 2018-01-09 MED ORDER — CEFTRIAXONE SODIUM 1 G IJ SOLR
INTRAMUSCULAR | Status: AC
  Filled 2018-01-09: qty 10

## 2018-01-09 MED ORDER — IPRATROPIUM-ALBUTEROL 0.5-2.5 (3) MG/3ML IN SOLN
3.0000 mL | Freq: Once | RESPIRATORY_TRACT | Status: AC
  Administered 2018-01-09: 3 mL via RESPIRATORY_TRACT

## 2018-01-09 MED ORDER — ACETAMINOPHEN 325 MG PO TABS
650.0000 mg | ORAL_TABLET | Freq: Once | ORAL | Status: AC
  Administered 2018-01-09: 650 mg via ORAL

## 2018-01-09 MED ORDER — ACETAMINOPHEN 325 MG PO TABS
ORAL_TABLET | ORAL | Status: AC
  Filled 2018-01-09: qty 2

## 2018-01-09 MED ORDER — METHYLPREDNISOLONE SODIUM SUCC 125 MG IJ SOLR
INTRAMUSCULAR | Status: AC
  Filled 2018-01-09: qty 2

## 2018-01-09 MED ORDER — KETOROLAC TROMETHAMINE 30 MG/ML IJ SOLN
INTRAMUSCULAR | Status: AC
  Filled 2018-01-09: qty 1

## 2018-01-09 MED ORDER — AZITHROMYCIN 250 MG PO TABS: 250.0000 mg | ORAL_TABLET | Freq: Every day | ORAL | 0 refills | Status: DC
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
## 2018-01-09 NOTE — Discharge Instructions (Addendum)
°  Please take your medications as prescribed and follow up with family medicine later this week if not improving.  Call 911 or go to the hospital if symptoms worsening.

## 2018-01-09 NOTE — ED Triage Notes (Signed)
Pt c/o fever, body aches, chills, headache since yesterday.

## 2018-02-01 ENCOUNTER — Emergency Department (HOSPITAL_COMMUNITY): Payer: Medicare Other

## 2018-02-01 ENCOUNTER — Emergency Department (HOSPITAL_COMMUNITY)
Admission: EM | Admit: 2018-02-01 | Discharge: 2018-02-02 | Disposition: A | Discharge status: Home / Self Care | Payer: Medicare Other | Attending: Emergency Medicine | Admitting: Emergency Medicine

## 2018-02-01 ENCOUNTER — Other Ambulatory Visit: Payer: Self-pay

## 2018-02-01 DIAGNOSIS — R51 Headache: Secondary | ICD-10-CM | POA: Diagnosis not present

## 2018-02-01 DIAGNOSIS — S20211A Contusion of right front wall of thorax, initial encounter: Secondary | ICD-10-CM

## 2018-02-01 DIAGNOSIS — I509 Heart failure, unspecified: Secondary | ICD-10-CM | POA: Insufficient documentation

## 2018-02-01 DIAGNOSIS — I252 Old myocardial infarction: Secondary | ICD-10-CM | POA: Diagnosis not present

## 2018-02-01 DIAGNOSIS — S3991XA Unspecified injury of abdomen, initial encounter: Secondary | ICD-10-CM | POA: Diagnosis not present

## 2018-02-01 DIAGNOSIS — S299XXA Unspecified injury of thorax, initial encounter: Secondary | ICD-10-CM | POA: Diagnosis not present

## 2018-02-01 DIAGNOSIS — Y939 Activity, unspecified: Secondary | ICD-10-CM | POA: Diagnosis not present

## 2018-02-01 DIAGNOSIS — E039 Hypothyroidism, unspecified: Secondary | ICD-10-CM | POA: Diagnosis not present

## 2018-02-01 DIAGNOSIS — R1010 Upper abdominal pain, unspecified: Secondary | ICD-10-CM | POA: Insufficient documentation

## 2018-02-01 DIAGNOSIS — S79911A Unspecified injury of right hip, initial encounter: Secondary | ICD-10-CM | POA: Diagnosis not present

## 2018-02-01 DIAGNOSIS — Z7984 Long term (current) use of oral hypoglycemic drugs: Secondary | ICD-10-CM | POA: Insufficient documentation

## 2018-02-01 DIAGNOSIS — Y929 Unspecified place or not applicable: Secondary | ICD-10-CM | POA: Insufficient documentation

## 2018-02-01 DIAGNOSIS — E119 Type 2 diabetes mellitus without complications: Secondary | ICD-10-CM | POA: Insufficient documentation

## 2018-02-01 DIAGNOSIS — S0990XA Unspecified injury of head, initial encounter: Secondary | ICD-10-CM | POA: Insufficient documentation

## 2018-02-01 DIAGNOSIS — R0789 Other chest pain: Secondary | ICD-10-CM | POA: Diagnosis not present

## 2018-02-01 DIAGNOSIS — S22009A Unspecified fracture of unspecified thoracic vertebra, initial encounter for closed fracture: Secondary | ICD-10-CM

## 2018-02-01 DIAGNOSIS — I110 Hypertensive heart disease with heart failure: Secondary | ICD-10-CM | POA: Insufficient documentation

## 2018-02-01 DIAGNOSIS — W108XXA Fall (on) (from) other stairs and steps, initial encounter: Secondary | ICD-10-CM | POA: Insufficient documentation

## 2018-02-01 DIAGNOSIS — M25551 Pain in right hip: Secondary | ICD-10-CM | POA: Diagnosis not present

## 2018-02-01 DIAGNOSIS — Z7982 Long term (current) use of aspirin: Secondary | ICD-10-CM | POA: Diagnosis not present

## 2018-02-01 DIAGNOSIS — S2241XA Multiple fractures of ribs, right side, initial encounter for closed fracture: Secondary | ICD-10-CM | POA: Diagnosis not present

## 2018-02-01 DIAGNOSIS — Y999 Unspecified external cause status: Secondary | ICD-10-CM | POA: Diagnosis not present

## 2018-02-01 DIAGNOSIS — S199XXA Unspecified injury of neck, initial encounter: Secondary | ICD-10-CM | POA: Diagnosis not present

## 2018-02-01 DIAGNOSIS — W19XXXA Unspecified fall, initial encounter: Secondary | ICD-10-CM

## 2018-02-01 DIAGNOSIS — S22079A Unspecified fracture of T9-T10 vertebra, initial encounter for closed fracture: Secondary | ICD-10-CM | POA: Diagnosis not present

## 2018-02-01 DIAGNOSIS — J449 Chronic obstructive pulmonary disease, unspecified: Secondary | ICD-10-CM | POA: Insufficient documentation

## 2018-02-01 DIAGNOSIS — S3993XA Unspecified injury of pelvis, initial encounter: Secondary | ICD-10-CM | POA: Diagnosis not present

## 2018-02-01 NOTE — ED Triage Notes (Signed)
Patient states going down a flight of steps and lost her footing and fell. C/o right sided rib pain.

## 2018-02-01 NOTE — ED Notes (Signed)
Acuity changed to 3 d/t age and history

## 2018-02-02 ENCOUNTER — Emergency Department (HOSPITAL_COMMUNITY): Payer: Medicare Other

## 2018-02-02 DIAGNOSIS — R51 Headache: Secondary | ICD-10-CM | POA: Diagnosis not present

## 2018-02-02 DIAGNOSIS — S3993XA Unspecified injury of pelvis, initial encounter: Secondary | ICD-10-CM | POA: Diagnosis not present

## 2018-02-02 DIAGNOSIS — S199XXA Unspecified injury of neck, initial encounter: Secondary | ICD-10-CM | POA: Diagnosis not present

## 2018-02-02 DIAGNOSIS — S299XXA Unspecified injury of thorax, initial encounter: Secondary | ICD-10-CM | POA: Diagnosis not present

## 2018-02-02 DIAGNOSIS — R0789 Other chest pain: Secondary | ICD-10-CM | POA: Diagnosis not present

## 2018-02-02 DIAGNOSIS — S3991XA Unspecified injury of abdomen, initial encounter: Secondary | ICD-10-CM | POA: Diagnosis not present

## 2018-02-02 DIAGNOSIS — S22078A Other fracture of T9-T10 vertebra, initial encounter for closed fracture: Secondary | ICD-10-CM | POA: Diagnosis not present

## 2018-02-02 DIAGNOSIS — S0990XA Unspecified injury of head, initial encounter: Secondary | ICD-10-CM | POA: Diagnosis not present

## 2018-02-02 DIAGNOSIS — S22079A Unspecified fracture of T9-T10 vertebra, initial encounter for closed fracture: Secondary | ICD-10-CM | POA: Diagnosis not present

## 2018-02-02 LAB — CBC WITH DIFFERENTIAL/PLATELET
Abs Immature Granulocytes: 0.02 10*3/uL (ref 0.00–0.07)
Basophils Absolute: 0 10*3/uL (ref 0.0–0.1)
Basophils Relative: 0 %
Eosinophils Absolute: 0.5 10*3/uL (ref 0.0–0.5)
Eosinophils Relative: 6 %
HCT: 37.4 % (ref 36.0–46.0)
Hemoglobin: 11.6 g/dL — ABNORMAL LOW (ref 12.0–15.0)
Immature Granulocytes: 0 %
Lymphocytes Relative: 19 %
Lymphs Abs: 1.5 10*3/uL (ref 0.7–4.0)
MCH: 27.8 pg (ref 26.0–34.0)
MCHC: 31 g/dL (ref 30.0–36.0)
MCV: 89.7 fL (ref 80.0–100.0)
Monocytes Absolute: 0.8 10*3/uL (ref 0.1–1.0)
Monocytes Relative: 11 %
Neutro Abs: 4.9 10*3/uL (ref 1.7–7.7)
Neutrophils Relative %: 64 %
Platelets: 172 10*3/uL (ref 150–400)
RBC: 4.17 MIL/uL (ref 3.87–5.11)
RDW: 15.6 % — ABNORMAL HIGH (ref 11.5–15.5)
WBC: 7.7 10*3/uL (ref 4.0–10.5)
nRBC: 0 % (ref 0.0–0.2)

## 2018-02-02 LAB — COMPREHENSIVE METABOLIC PANEL
ALT: 11 U/L (ref 0–44)
AST: 18 U/L (ref 15–41)
Albumin: 3.8 g/dL (ref 3.5–5.0)
Alkaline Phosphatase: 104 U/L (ref 38–126)
Anion gap: 10 (ref 5–15)
BUN: 19 mg/dL (ref 8–23)
CO2: 23 mmol/L (ref 22–32)
Calcium: 10.4 mg/dL — ABNORMAL HIGH (ref 8.9–10.3)
Chloride: 110 mmol/L (ref 98–111)
Creatinine, Ser: 1.23 mg/dL — ABNORMAL HIGH (ref 0.44–1.00)
GFR calc Af Amer: 48 mL/min — ABNORMAL LOW (ref >60)
GFR calc non Af Amer: 41 mL/min — ABNORMAL LOW (ref >60)
Glucose, Bld: 168 mg/dL — ABNORMAL HIGH (ref 70–99)
Potassium: 4.2 mmol/L (ref 3.5–5.1)
Sodium: 143 mmol/L (ref 135–145)
Total Bilirubin: 0.6 mg/dL (ref 0.3–1.2)
Total Protein: 6.9 g/dL (ref 6.5–8.1)

## 2018-02-02 MED ORDER — OXYCODONE HCL 5 MG PO TABS: 5.0000 mg | ORAL_TABLET | Freq: Four times a day (QID) | ORAL | 0 refills | Status: DC | PRN

## 2018-02-02 MED ORDER — IOHEXOL 300 MG/ML  SOLN
100.0000 mL | Freq: Once | INTRAMUSCULAR | Status: AC | PRN
  Administered 2018-02-02: 100 mL via INTRAVENOUS

## 2018-02-02 MED ORDER — FENTANYL CITRATE (PF) 100 MCG/2ML IJ SOLN
50.0000 ug | Freq: Once | INTRAMUSCULAR | Status: AC
  Administered 2018-02-02: 50 ug via INTRAVENOUS
  Filled 2018-02-02: qty 2

## 2018-02-02 MED ORDER — SODIUM CHLORIDE 0.9 % IV BOLUS (SEPSIS)
500.0000 mL | Freq: Once | INTRAVENOUS | Status: AC
  Administered 2018-02-02: 500 mL via INTRAVENOUS

## 2018-02-02 MED ORDER — ONDANSETRON HCL 4 MG/2ML IJ SOLN
4.0000 mg | Freq: Once | INTRAMUSCULAR | Status: AC
  Administered 2018-02-02: 4 mg via INTRAVENOUS
  Filled 2018-02-02: qty 2

## 2018-02-02 NOTE — ED Provider Notes (Signed)
83 year old female received at sign out from Dr. Leonides Schanz pending imaging. Per her HPI:   "Patient is an 83 year old female with history of CHF, COPD, hypertension, hypothyroidism, TIA on aspirin who presents to the emergency department after she fell.  States last night she fell down several steps landing on her right side.  She did strike her head on concrete.  No loss of consciousness.  Is complaining of right lateral rib pain, right upper abdominal pain, mid thoracic back pain.  Normally ambulates without difficulty but states she is unable to walk because of pain in her chest and abdomen.  Denies any pain in the lower extremities or upper extremities.  No numbness or weakness."  Physical Exam  BP 130/66   Pulse 70   Temp 98 F (36.7 C) (Oral)   Resp 14   Ht 5\' 8"  (1.727 m)   Wt 94.3 kg   SpO2 100%   BMI 31.63 kg/m   Physical Exam NAD. [place] in place.  TTP over the right ribs. No tachypnea.  ED Course/Procedures     Procedures  MDM   83 year old female with history of CHF, COPD, hypertension, hypothyroidism, TIA on aspirin received a signout from Dr. Leonides Schanz.  She presented to the ER after she fell down several steps and landed on her right side.  She struck her head on concrete, but did not have loss of consciousness.  X-ray of the right ribs with concern for acute fracture of ribs 8 and 10, but CT chest was negative for acute rib fractures per radiology read.  CT chest also reviewed by me without evidence of acute rib fractures.  Spoke with Dr. Leodis Rains, radiology who reviewed the images with me on the phone and concurred that no rib fractures were present.   CT thoracic spine with nondisplaced T10 vertebral body fracture.  Consulted neurosurgery and spoke with Dr. Arnoldo Morale who plans to evaluate the patient in the ER.  Recommended TLSO brace, which has been ordered.  He recommends a follow-up in the clinic in the next 1 to 2 months and to wear the brace at all times when out of  bed.  Patient was ambulatory without difficulty with TLSO brace in place.  I have reached out to Dr. Damita Dunnings, PCP, to coordinate her outpatient care.  Strict return precautions given.  She is hemodynamically stable and in no acute distress.  She is safe for discharge to home with outpatient follow-up at this time.      Joanne Gavel, PA-C 02/02/18 1419    Mesner, Corene Cornea, MD 02/03/18 [phone]

## 2018-02-02 NOTE — ED Notes (Signed)
Pt's sats dropped after Fentanyl given, placed on 2L

## 2018-02-02 NOTE — ED Notes (Signed)
Per ortho- rep is on their way to fit pt for brace

## 2018-02-02 NOTE — ED Notes (Signed)
Pt ambulated in hall w/ back brace with no complaints. Pt ambulated with assistance.

## 2018-02-02 NOTE — ED Notes (Signed)
Patient verbalizes understanding of discharge instructions. Opportunity for questioning and answers were provided. Armband removed by staff, pt discharged from ED.  

## 2018-02-02 NOTE — Progress Notes (Signed)
Orthopedic Tech Progress Note Patient Details:  Caitlyn Aguirre [birth year] [id number]         Patient ID: Caitlyn Aguirre, female   DOB: [birth year], 83 y.o.   MRN: [id number] Called in order to [facility] 02/02/2018, 11:48 AM

## 2018-02-02 NOTE — Discharge Instructions (Signed)
Thank you for allowing me to care for you today in the Emergency Department.  Dr. Arnoldo Morale would like to see you in the clinic in 1 to 2 months.  Call his office to schedule a follow-up appointment.  Take 1 tablet of oxycodone every 6 hours as needed for severe pain.  For mild to moderate pain, you can take 650 mg of Tylenol every 6 hours.  Do not drink alcohol while you are taking oxycodone and use caution with other medications that can make you sleepy.  Do not drive while taking this medication.  Use the incentive spirometer like we have shown you in the emergency department.  Using this helps to prevent pneumonia.  For pain in your right ribs, you can use a pillow under your right arm to help with pain if you have to cough and for sleeping.  Please call and schedule a follow-up appointment with your primary care provider for a recheck in 4 to 5 days.  Return to the emergency department if you develop uncontrollable pain, if you have another fall, develop new numbness or weakness, if you come very short of breath, develop a high fever, or other new, concerning symptoms.
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
## 2018-02-07 ENCOUNTER — Other Ambulatory Visit: Payer: Self-pay

## 2018-02-07 NOTE — Telephone Encounter (Signed)
Requesting to speak with a nurse about meds. Please call pt back.  

## 2018-02-07 NOTE — Telephone Encounter (Signed)
Returned call to patient, no answer.  Left message to call RN back.  SChaplin,RN   

## 2018-02-08 ENCOUNTER — Ambulatory Visit (INDEPENDENT_AMBULATORY_CARE_PROVIDER_SITE_OTHER): Payer: Medicare Other | Admitting: Internal Medicine

## 2018-02-08 ENCOUNTER — Encounter: Payer: Self-pay | Admitting: Internal Medicine

## 2018-02-08 ENCOUNTER — Other Ambulatory Visit: Payer: Self-pay

## 2018-02-08 VITALS — BP 164/111 | HR 82 | Temp 98.2°F | Ht 68.0 in | Wt 207.3 lb

## 2018-02-08 DIAGNOSIS — S22079A Unspecified fracture of T9-T10 vertebra, initial encounter for closed fracture: Secondary | ICD-10-CM

## 2018-02-08 DIAGNOSIS — S22071D Stable burst fracture of T9-T10 vertebra, subsequent encounter for fracture with routine healing: Secondary | ICD-10-CM

## 2018-02-08 DIAGNOSIS — J449 Chronic obstructive pulmonary disease, unspecified: Secondary | ICD-10-CM

## 2018-02-08 DIAGNOSIS — J438 Other emphysema: Secondary | ICD-10-CM

## 2018-02-08 DIAGNOSIS — Z9181 History of falling: Secondary | ICD-10-CM | POA: Diagnosis not present

## 2018-02-08 DIAGNOSIS — W19XXXD Unspecified fall, subsequent encounter: Secondary | ICD-10-CM

## 2018-02-08 DIAGNOSIS — Z79891 Long term (current) use of opiate analgesic: Secondary | ICD-10-CM

## 2018-02-08 HISTORY — DX: Unspecified fracture of t9-t10 vertebra, initial encounter for closed fracture: S22.079A

## 2018-02-08 MED ORDER — OXYCODONE HCL 5 MG PO TABS: 5.0000 mg | ORAL_TABLET | Freq: Four times a day (QID) | ORAL | 0 refills | Status: DC | PRN

## 2018-02-08 NOTE — Assessment & Plan Note (Addendum)
Caitlyn Aguirre had a recent ED admission on 01/31/2018 due to another fall. Per patient and her daugther, it was a mechanical fall, when she missed few last steps when going down the stairs. She fell on her right side.  She found to have non displaced T10 vertebral body fracture.  No other acute abnormality per imagings.  -Placed Home health order for PT, gait training, fall prevention plan

## 2018-02-08 NOTE — Assessment & Plan Note (Addendum)
Stable on this visit.  Patient's daughter states that she was prescribed Albuterol nebulizer but was not given the nebulizer machine. Asks [place] to provide it for her.   -Order placed for DME, nebulizer machine -Continue current meds and inhalor

## 2018-02-08 NOTE — Addendum Note (Signed)
Addended byDewayne Hatch on: 02/08/2018 04:55 PM   Modules accepted: Orders

## 2018-02-08 NOTE — Progress Notes (Signed)
Internal Medicine Clinic Attending  Case discussed with Dr. Myrtie Hawk at the time of the visit.  We reviewed the resident's history and exam and pertinent patient test results.  I agree with the assessment, diagnosis, and plan of care documented in the resident's note.  Dr Myrtie Hawk got th hx that she fell down several steps so not clear if fragility fx. DEXA nl but yrs ago. Dr Evette Doffing has already ordered a DEXA.

## 2018-02-08 NOTE — Assessment & Plan Note (Addendum)
Caitlyn Aguirre had a recent ED admission on 01/31/2018 due to fall. She found to have nondisplaced T10 vertebral body fracture Discharged with Brace and Oxycodone  Forpain. She came in for follow up today. Pain was controled with Oxycodone 5 mg q6h PRN before she ran out if that yesterday. She complains of generalized right lower back pain as well as right lower rib cage and right abdomen, (She fell on her right side). No numbness or weakness. She is not using the brace since it is tight.  Has generalized tenderness on her right lower back, spines and right side abdomen.   -Recommend to use the brace -Home health for PT, gait training, Brace adjustment, incentive spirometry--> Recommended Bed side commode and Rollator. Order placed.  -Refilled Oxycodone for pain.  -f/u Dexa scan scheduled for 10th

## 2018-02-08 NOTE — Patient Instructions (Signed)
It was our pleasure taking care of you in our clinic today.  You were for follow up of your emergency room visit.   I refill your pain medicine. Please take that as needed. It can cause constipation, so you can take stool softener as needed. I recommend you to use the brace due to the spine fracture you found to have after the fall.  I also put the order for physical therapy for you to prevent further fall and help you regain your strength.  Should you have any questions or concerns please call the internal medicine clinic at [phone].    Thanks,
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
## 2018-02-09 ENCOUNTER — Telehealth: Payer: Self-pay | Admitting: *Deleted

## 2018-02-09 NOTE — Telephone Encounter (Signed)
Have emailed Tiffany Watson, RN with [facility] for HH PT order. She will let us know if she is able to take this patient. L. [place], RN, BSN   

## 2018-02-10 NOTE — Telephone Encounter (Signed)
Received call from Tiffany at [facility]. They will be able to provide HH PT for patient. SOC date 02/14/2018 or 02/15/2018. L. Claborn Janusz, RN, BSN   

## 2018-02-11 ENCOUNTER — Ambulatory Visit
Admission: RE | Admit: 2018-02-11 | Discharge: 2018-02-11 | Disposition: A | Discharge status: Home / Self Care | Payer: Medicare Other | Source: Ambulatory Visit | Attending: Student in an Organized Health Care Education/Training Program | Admitting: Student in an Organized Health Care Education/Training Program

## 2018-02-11 DIAGNOSIS — Z13820 Encounter for screening for osteoporosis: Secondary | ICD-10-CM | POA: Diagnosis not present

## 2018-02-11 DIAGNOSIS — Z1231 Encounter for screening mammogram for malignant neoplasm of breast: Secondary | ICD-10-CM

## 2018-02-11 DIAGNOSIS — E2839 Other primary ovarian failure: Secondary | ICD-10-CM

## 2018-02-11 DIAGNOSIS — Z780 Asymptomatic menopausal state: Secondary | ICD-10-CM | POA: Diagnosis not present

## 2018-02-12 DIAGNOSIS — I110 Hypertensive heart disease with heart failure: Secondary | ICD-10-CM | POA: Diagnosis not present

## 2018-02-12 DIAGNOSIS — I509 Heart failure, unspecified: Secondary | ICD-10-CM | POA: Diagnosis not present

## 2018-02-12 DIAGNOSIS — Z7984 Long term (current) use of oral hypoglycemic drugs: Secondary | ICD-10-CM | POA: Diagnosis not present

## 2018-02-12 DIAGNOSIS — S22079D Unspecified fracture of T9-T10 vertebra, subsequent encounter for fracture with routine healing: Secondary | ICD-10-CM | POA: Diagnosis not present

## 2018-02-12 DIAGNOSIS — E119 Type 2 diabetes mellitus without complications: Secondary | ICD-10-CM | POA: Diagnosis not present

## 2018-02-12 DIAGNOSIS — M1991 Primary osteoarthritis, unspecified site: Secondary | ICD-10-CM | POA: Diagnosis not present

## 2018-02-12 DIAGNOSIS — M353 Polymyalgia rheumatica: Secondary | ICD-10-CM | POA: Diagnosis not present

## 2018-02-12 DIAGNOSIS — E039 Hypothyroidism, unspecified: Secondary | ICD-10-CM | POA: Diagnosis not present

## 2018-02-12 DIAGNOSIS — K5730 Diverticulosis of large intestine without perforation or abscess without bleeding: Secondary | ICD-10-CM | POA: Diagnosis not present

## 2018-02-12 DIAGNOSIS — S2241XD Multiple fractures of ribs, right side, subsequent encounter for fracture with routine healing: Secondary | ICD-10-CM | POA: Diagnosis not present

## 2018-02-12 DIAGNOSIS — J449 Chronic obstructive pulmonary disease, unspecified: Secondary | ICD-10-CM | POA: Diagnosis not present

## 2018-02-12 DIAGNOSIS — Z87891 Personal history of nicotine dependence: Secondary | ICD-10-CM | POA: Diagnosis not present

## 2018-02-12 DIAGNOSIS — Z9181 History of falling: Secondary | ICD-10-CM | POA: Diagnosis not present

## 2018-02-12 DIAGNOSIS — Z8673 Personal history of transient ischemic attack (TIA), and cerebral infarction without residual deficits: Secondary | ICD-10-CM | POA: Diagnosis not present

## 2018-02-12 DIAGNOSIS — I429 Cardiomyopathy, unspecified: Secondary | ICD-10-CM | POA: Diagnosis not present

## 2018-02-15 ENCOUNTER — Telehealth: Payer: Self-pay

## 2018-02-15 ENCOUNTER — Encounter: Payer: Self-pay | Admitting: Student in an Organized Health Care Education/Training Program

## 2018-02-15 ENCOUNTER — Other Ambulatory Visit: Payer: Self-pay

## 2018-02-15 ENCOUNTER — Other Ambulatory Visit: Payer: Self-pay | Admitting: *Deleted

## 2018-02-15 DIAGNOSIS — S22079A Unspecified fracture of T9-T10 vertebra, initial encounter for closed fracture: Secondary | ICD-10-CM

## 2018-02-15 DIAGNOSIS — J438 Other emphysema: Secondary | ICD-10-CM

## 2018-02-15 MED ORDER — CHLORTHALIDONE 25 MG PO TABS: 25.0000 mg | ORAL_TABLET | Freq: Every day | ORAL | 1 refills | Status: DC

## 2018-02-15 MED ORDER — PAROXETINE HCL 30 MG PO TABS: 30.0000 mg | ORAL_TABLET | Freq: Every day | ORAL | 1 refills | Status: DC

## 2018-02-15 NOTE — Telephone Encounter (Signed)
I agree with verbal order. I have put orders in for the DME, signed, and left with front desk, they need to be faxed please.

## 2018-02-15 NOTE — Telephone Encounter (Signed)
Spoke with Verdis Frederickson at [facility] [place]. Fax number was for [facility] who do not supply DME. Spoke with Barnetta Chapel at [facility] who works with [facility]. Barnetta Chapel is checking to see how soon they can provide these items to patient. She will call back as soon as she knows. Hubbard Hartshorn, RN, BSN

## 2018-02-15 NOTE — Telephone Encounter (Signed)
VO for PT eval, 1x week for 1 week, 2x week for 4 weeks. Visits will work on strengthening, balance, gait, safety. Do you agree?  Also needs script for a 3 in 1 commode and a rollator walker. Fax script to [phone]  Also needs a nebulizer, has the solution but no machine, use clinic's usual provider of respiratory equipment for this.

## 2018-02-15 NOTE — Telephone Encounter (Addendum)
Caitlyn Aguirre states they can deliver supplies to patient tomorrow AM. Orders for DME along with demographic sheet and OV notes from 02/08/2018 faxed to [facility] at [phone]. Left message on patient's VM that this has been done and to expect call from [facility]. Requested return call for any questions. Hubbard Hartshorn, RN, BSN

## 2018-02-15 NOTE — Telephone Encounter (Signed)
Sent in new encounter 

## 2018-02-15 NOTE — Telephone Encounter (Signed)
chlorthalidone (HYGROTON) 25 MG tablet  PARoxetine (PAXIL) 30 MG tablet  Refill request @ [facility] on [address].

## 2018-02-15 NOTE — Telephone Encounter (Signed)
Caitlyn Aguirre with [facility] at home requesting VO for PT. Please call back.

## 2018-02-16 DIAGNOSIS — M1991 Primary osteoarthritis, unspecified site: Secondary | ICD-10-CM | POA: Diagnosis not present

## 2018-02-16 DIAGNOSIS — I110 Hypertensive heart disease with heart failure: Secondary | ICD-10-CM | POA: Diagnosis not present

## 2018-02-16 DIAGNOSIS — J441 Chronic obstructive pulmonary disease with (acute) exacerbation: Secondary | ICD-10-CM | POA: Diagnosis not present

## 2018-02-16 DIAGNOSIS — Z7984 Long term (current) use of oral hypoglycemic drugs: Secondary | ICD-10-CM | POA: Diagnosis not present

## 2018-02-16 DIAGNOSIS — Z87891 Personal history of nicotine dependence: Secondary | ICD-10-CM | POA: Diagnosis not present

## 2018-02-16 DIAGNOSIS — E039 Hypothyroidism, unspecified: Secondary | ICD-10-CM | POA: Diagnosis not present

## 2018-02-16 DIAGNOSIS — K5730 Diverticulosis of large intestine without perforation or abscess without bleeding: Secondary | ICD-10-CM | POA: Diagnosis not present

## 2018-02-16 DIAGNOSIS — I5031 Acute diastolic (congestive) heart failure: Secondary | ICD-10-CM | POA: Diagnosis not present

## 2018-02-16 DIAGNOSIS — S2241XD Multiple fractures of ribs, right side, subsequent encounter for fracture with routine healing: Secondary | ICD-10-CM | POA: Diagnosis not present

## 2018-02-16 DIAGNOSIS — S22079D Unspecified fracture of T9-T10 vertebra, subsequent encounter for fracture with routine healing: Secondary | ICD-10-CM | POA: Diagnosis not present

## 2018-02-16 DIAGNOSIS — Z9181 History of falling: Secondary | ICD-10-CM | POA: Diagnosis not present

## 2018-02-16 DIAGNOSIS — E119 Type 2 diabetes mellitus without complications: Secondary | ICD-10-CM | POA: Diagnosis not present

## 2018-02-16 DIAGNOSIS — I509 Heart failure, unspecified: Secondary | ICD-10-CM | POA: Diagnosis not present

## 2018-02-16 DIAGNOSIS — S22078A Other fracture of T9-T10 vertebra, initial encounter for closed fracture: Secondary | ICD-10-CM | POA: Diagnosis not present

## 2018-02-16 DIAGNOSIS — I429 Cardiomyopathy, unspecified: Secondary | ICD-10-CM | POA: Diagnosis not present

## 2018-02-16 DIAGNOSIS — Z8673 Personal history of transient ischemic attack (TIA), and cerebral infarction without residual deficits: Secondary | ICD-10-CM | POA: Diagnosis not present

## 2018-02-16 DIAGNOSIS — J449 Chronic obstructive pulmonary disease, unspecified: Secondary | ICD-10-CM | POA: Diagnosis not present

## 2018-02-16 DIAGNOSIS — M353 Polymyalgia rheumatica: Secondary | ICD-10-CM | POA: Diagnosis not present

## 2018-02-16 NOTE — Telephone Encounter (Signed)
Start of service was 02/15/2018. Hubbard Hartshorn, RN, BSN

## 2018-02-16 NOTE — Telephone Encounter (Deleted)
Received call from [facility] at [facility]. All DME items to be delivered this AM. Hubbard Hartshorn, RN, BSN

## 2018-02-16 NOTE — Telephone Encounter (Signed)
Received call from [facility] at [facility]. All DME items will be delivered to patient this AM. Hubbard Hartshorn, RN, BSN

## 2018-02-22 DIAGNOSIS — J449 Chronic obstructive pulmonary disease, unspecified: Secondary | ICD-10-CM | POA: Diagnosis not present

## 2018-02-22 DIAGNOSIS — E039 Hypothyroidism, unspecified: Secondary | ICD-10-CM | POA: Diagnosis not present

## 2018-02-22 DIAGNOSIS — M353 Polymyalgia rheumatica: Secondary | ICD-10-CM | POA: Diagnosis not present

## 2018-02-22 DIAGNOSIS — Z9181 History of falling: Secondary | ICD-10-CM | POA: Diagnosis not present

## 2018-02-22 DIAGNOSIS — Z7984 Long term (current) use of oral hypoglycemic drugs: Secondary | ICD-10-CM | POA: Diagnosis not present

## 2018-02-22 DIAGNOSIS — I509 Heart failure, unspecified: Secondary | ICD-10-CM | POA: Diagnosis not present

## 2018-02-22 DIAGNOSIS — S2241XD Multiple fractures of ribs, right side, subsequent encounter for fracture with routine healing: Secondary | ICD-10-CM | POA: Diagnosis not present

## 2018-02-22 DIAGNOSIS — I110 Hypertensive heart disease with heart failure: Secondary | ICD-10-CM | POA: Diagnosis not present

## 2018-02-22 DIAGNOSIS — Z87891 Personal history of nicotine dependence: Secondary | ICD-10-CM | POA: Diagnosis not present

## 2018-02-22 DIAGNOSIS — Z8673 Personal history of transient ischemic attack (TIA), and cerebral infarction without residual deficits: Secondary | ICD-10-CM | POA: Diagnosis not present

## 2018-02-22 DIAGNOSIS — M1991 Primary osteoarthritis, unspecified site: Secondary | ICD-10-CM | POA: Diagnosis not present

## 2018-02-22 DIAGNOSIS — I429 Cardiomyopathy, unspecified: Secondary | ICD-10-CM | POA: Diagnosis not present

## 2018-02-22 DIAGNOSIS — S22079D Unspecified fracture of T9-T10 vertebra, subsequent encounter for fracture with routine healing: Secondary | ICD-10-CM | POA: Diagnosis not present

## 2018-02-22 DIAGNOSIS — E119 Type 2 diabetes mellitus without complications: Secondary | ICD-10-CM | POA: Diagnosis not present

## 2018-02-22 DIAGNOSIS — K5730 Diverticulosis of large intestine without perforation or abscess without bleeding: Secondary | ICD-10-CM | POA: Diagnosis not present

## 2018-02-24 ENCOUNTER — Encounter: Payer: Self-pay | Admitting: *Deleted

## 2018-02-25 DIAGNOSIS — E119 Type 2 diabetes mellitus without complications: Secondary | ICD-10-CM | POA: Diagnosis not present

## 2018-02-25 DIAGNOSIS — I509 Heart failure, unspecified: Secondary | ICD-10-CM | POA: Diagnosis not present

## 2018-02-25 DIAGNOSIS — K5730 Diverticulosis of large intestine without perforation or abscess without bleeding: Secondary | ICD-10-CM | POA: Diagnosis not present

## 2018-02-25 DIAGNOSIS — M1991 Primary osteoarthritis, unspecified site: Secondary | ICD-10-CM | POA: Diagnosis not present

## 2018-02-25 DIAGNOSIS — E039 Hypothyroidism, unspecified: Secondary | ICD-10-CM | POA: Diagnosis not present

## 2018-02-25 DIAGNOSIS — S22079D Unspecified fracture of T9-T10 vertebra, subsequent encounter for fracture with routine healing: Secondary | ICD-10-CM | POA: Diagnosis not present

## 2018-02-25 DIAGNOSIS — Z87891 Personal history of nicotine dependence: Secondary | ICD-10-CM | POA: Diagnosis not present

## 2018-02-25 DIAGNOSIS — Z8673 Personal history of transient ischemic attack (TIA), and cerebral infarction without residual deficits: Secondary | ICD-10-CM | POA: Diagnosis not present

## 2018-02-25 DIAGNOSIS — J449 Chronic obstructive pulmonary disease, unspecified: Secondary | ICD-10-CM | POA: Diagnosis not present

## 2018-02-25 DIAGNOSIS — S2241XD Multiple fractures of ribs, right side, subsequent encounter for fracture with routine healing: Secondary | ICD-10-CM | POA: Diagnosis not present

## 2018-02-25 DIAGNOSIS — I429 Cardiomyopathy, unspecified: Secondary | ICD-10-CM | POA: Diagnosis not present

## 2018-02-25 DIAGNOSIS — Z9181 History of falling: Secondary | ICD-10-CM | POA: Diagnosis not present

## 2018-02-25 DIAGNOSIS — I110 Hypertensive heart disease with heart failure: Secondary | ICD-10-CM | POA: Diagnosis not present

## 2018-02-25 DIAGNOSIS — Z7984 Long term (current) use of oral hypoglycemic drugs: Secondary | ICD-10-CM | POA: Diagnosis not present

## 2018-02-25 DIAGNOSIS — M353 Polymyalgia rheumatica: Secondary | ICD-10-CM | POA: Diagnosis not present

## 2018-02-28 DIAGNOSIS — I429 Cardiomyopathy, unspecified: Secondary | ICD-10-CM | POA: Diagnosis not present

## 2018-02-28 DIAGNOSIS — M1991 Primary osteoarthritis, unspecified site: Secondary | ICD-10-CM | POA: Diagnosis not present

## 2018-02-28 DIAGNOSIS — S22079D Unspecified fracture of T9-T10 vertebra, subsequent encounter for fracture with routine healing: Secondary | ICD-10-CM | POA: Diagnosis not present

## 2018-02-28 DIAGNOSIS — I110 Hypertensive heart disease with heart failure: Secondary | ICD-10-CM | POA: Diagnosis not present

## 2018-02-28 DIAGNOSIS — E119 Type 2 diabetes mellitus without complications: Secondary | ICD-10-CM | POA: Diagnosis not present

## 2018-02-28 DIAGNOSIS — Z7984 Long term (current) use of oral hypoglycemic drugs: Secondary | ICD-10-CM | POA: Diagnosis not present

## 2018-02-28 DIAGNOSIS — E039 Hypothyroidism, unspecified: Secondary | ICD-10-CM | POA: Diagnosis not present

## 2018-02-28 DIAGNOSIS — S2241XD Multiple fractures of ribs, right side, subsequent encounter for fracture with routine healing: Secondary | ICD-10-CM | POA: Diagnosis not present

## 2018-02-28 DIAGNOSIS — I509 Heart failure, unspecified: Secondary | ICD-10-CM | POA: Diagnosis not present

## 2018-02-28 DIAGNOSIS — Z8673 Personal history of transient ischemic attack (TIA), and cerebral infarction without residual deficits: Secondary | ICD-10-CM | POA: Diagnosis not present

## 2018-02-28 DIAGNOSIS — Z9181 History of falling: Secondary | ICD-10-CM | POA: Diagnosis not present

## 2018-02-28 DIAGNOSIS — K5730 Diverticulosis of large intestine without perforation or abscess without bleeding: Secondary | ICD-10-CM | POA: Diagnosis not present

## 2018-02-28 DIAGNOSIS — Z87891 Personal history of nicotine dependence: Secondary | ICD-10-CM | POA: Diagnosis not present

## 2018-02-28 DIAGNOSIS — J449 Chronic obstructive pulmonary disease, unspecified: Secondary | ICD-10-CM | POA: Diagnosis not present

## 2018-02-28 DIAGNOSIS — M353 Polymyalgia rheumatica: Secondary | ICD-10-CM | POA: Diagnosis not present

## 2018-03-02 DIAGNOSIS — S22079D Unspecified fracture of T9-T10 vertebra, subsequent encounter for fracture with routine healing: Secondary | ICD-10-CM | POA: Diagnosis not present

## 2018-03-02 DIAGNOSIS — S2241XD Multiple fractures of ribs, right side, subsequent encounter for fracture with routine healing: Secondary | ICD-10-CM | POA: Diagnosis not present

## 2018-03-02 DIAGNOSIS — I110 Hypertensive heart disease with heart failure: Secondary | ICD-10-CM | POA: Diagnosis not present

## 2018-03-02 DIAGNOSIS — Z7984 Long term (current) use of oral hypoglycemic drugs: Secondary | ICD-10-CM | POA: Diagnosis not present

## 2018-03-02 DIAGNOSIS — I429 Cardiomyopathy, unspecified: Secondary | ICD-10-CM | POA: Diagnosis not present

## 2018-03-02 DIAGNOSIS — K5730 Diverticulosis of large intestine without perforation or abscess without bleeding: Secondary | ICD-10-CM | POA: Diagnosis not present

## 2018-03-02 DIAGNOSIS — Z9181 History of falling: Secondary | ICD-10-CM | POA: Diagnosis not present

## 2018-03-02 DIAGNOSIS — Z87891 Personal history of nicotine dependence: Secondary | ICD-10-CM | POA: Diagnosis not present

## 2018-03-02 DIAGNOSIS — E119 Type 2 diabetes mellitus without complications: Secondary | ICD-10-CM | POA: Diagnosis not present

## 2018-03-02 DIAGNOSIS — Z8673 Personal history of transient ischemic attack (TIA), and cerebral infarction without residual deficits: Secondary | ICD-10-CM | POA: Diagnosis not present

## 2018-03-02 DIAGNOSIS — M1991 Primary osteoarthritis, unspecified site: Secondary | ICD-10-CM | POA: Diagnosis not present

## 2018-03-02 DIAGNOSIS — J449 Chronic obstructive pulmonary disease, unspecified: Secondary | ICD-10-CM | POA: Diagnosis not present

## 2018-03-02 DIAGNOSIS — I509 Heart failure, unspecified: Secondary | ICD-10-CM | POA: Diagnosis not present

## 2018-03-02 DIAGNOSIS — M353 Polymyalgia rheumatica: Secondary | ICD-10-CM | POA: Diagnosis not present

## 2018-03-02 DIAGNOSIS — E039 Hypothyroidism, unspecified: Secondary | ICD-10-CM | POA: Diagnosis not present

## 2018-03-09 DIAGNOSIS — S22079D Unspecified fracture of T9-T10 vertebra, subsequent encounter for fracture with routine healing: Secondary | ICD-10-CM | POA: Diagnosis not present

## 2018-03-09 DIAGNOSIS — K5730 Diverticulosis of large intestine without perforation or abscess without bleeding: Secondary | ICD-10-CM | POA: Diagnosis not present

## 2018-03-09 DIAGNOSIS — Z87891 Personal history of nicotine dependence: Secondary | ICD-10-CM | POA: Diagnosis not present

## 2018-03-09 DIAGNOSIS — Z8673 Personal history of transient ischemic attack (TIA), and cerebral infarction without residual deficits: Secondary | ICD-10-CM | POA: Diagnosis not present

## 2018-03-09 DIAGNOSIS — I110 Hypertensive heart disease with heart failure: Secondary | ICD-10-CM | POA: Diagnosis not present

## 2018-03-09 DIAGNOSIS — M1991 Primary osteoarthritis, unspecified site: Secondary | ICD-10-CM | POA: Diagnosis not present

## 2018-03-09 DIAGNOSIS — I429 Cardiomyopathy, unspecified: Secondary | ICD-10-CM | POA: Diagnosis not present

## 2018-03-09 DIAGNOSIS — I509 Heart failure, unspecified: Secondary | ICD-10-CM | POA: Diagnosis not present

## 2018-03-09 DIAGNOSIS — E119 Type 2 diabetes mellitus without complications: Secondary | ICD-10-CM | POA: Diagnosis not present

## 2018-03-09 DIAGNOSIS — Z7984 Long term (current) use of oral hypoglycemic drugs: Secondary | ICD-10-CM | POA: Diagnosis not present

## 2018-03-09 DIAGNOSIS — E039 Hypothyroidism, unspecified: Secondary | ICD-10-CM | POA: Diagnosis not present

## 2018-03-09 DIAGNOSIS — S2241XD Multiple fractures of ribs, right side, subsequent encounter for fracture with routine healing: Secondary | ICD-10-CM | POA: Diagnosis not present

## 2018-03-09 DIAGNOSIS — J449 Chronic obstructive pulmonary disease, unspecified: Secondary | ICD-10-CM | POA: Diagnosis not present

## 2018-03-09 DIAGNOSIS — Z9181 History of falling: Secondary | ICD-10-CM | POA: Diagnosis not present

## 2018-03-09 DIAGNOSIS — M353 Polymyalgia rheumatica: Secondary | ICD-10-CM | POA: Diagnosis not present

## 2018-03-10 DIAGNOSIS — Z87891 Personal history of nicotine dependence: Secondary | ICD-10-CM | POA: Diagnosis not present

## 2018-03-10 DIAGNOSIS — Z7984 Long term (current) use of oral hypoglycemic drugs: Secondary | ICD-10-CM | POA: Diagnosis not present

## 2018-03-10 DIAGNOSIS — M1991 Primary osteoarthritis, unspecified site: Secondary | ICD-10-CM | POA: Diagnosis not present

## 2018-03-10 DIAGNOSIS — J449 Chronic obstructive pulmonary disease, unspecified: Secondary | ICD-10-CM | POA: Diagnosis not present

## 2018-03-10 DIAGNOSIS — E039 Hypothyroidism, unspecified: Secondary | ICD-10-CM | POA: Diagnosis not present

## 2018-03-10 DIAGNOSIS — Z8673 Personal history of transient ischemic attack (TIA), and cerebral infarction without residual deficits: Secondary | ICD-10-CM | POA: Diagnosis not present

## 2018-03-10 DIAGNOSIS — S22079D Unspecified fracture of T9-T10 vertebra, subsequent encounter for fracture with routine healing: Secondary | ICD-10-CM | POA: Diagnosis not present

## 2018-03-10 DIAGNOSIS — K5730 Diverticulosis of large intestine without perforation or abscess without bleeding: Secondary | ICD-10-CM | POA: Diagnosis not present

## 2018-03-10 DIAGNOSIS — I429 Cardiomyopathy, unspecified: Secondary | ICD-10-CM | POA: Diagnosis not present

## 2018-03-10 DIAGNOSIS — I509 Heart failure, unspecified: Secondary | ICD-10-CM | POA: Diagnosis not present

## 2018-03-10 DIAGNOSIS — E119 Type 2 diabetes mellitus without complications: Secondary | ICD-10-CM | POA: Diagnosis not present

## 2018-03-10 DIAGNOSIS — Z9181 History of falling: Secondary | ICD-10-CM | POA: Diagnosis not present

## 2018-03-10 DIAGNOSIS — I110 Hypertensive heart disease with heart failure: Secondary | ICD-10-CM | POA: Diagnosis not present

## 2018-03-10 DIAGNOSIS — S2241XD Multiple fractures of ribs, right side, subsequent encounter for fracture with routine healing: Secondary | ICD-10-CM | POA: Diagnosis not present

## 2018-03-10 DIAGNOSIS — M353 Polymyalgia rheumatica: Secondary | ICD-10-CM | POA: Diagnosis not present

## 2018-03-19 ENCOUNTER — Ambulatory Visit
Admission: EM | Admit: 2018-03-19 | Discharge: 2018-03-19 | Disposition: A | Discharge status: Home / Self Care | Payer: Medicare Other | Attending: Family Medicine | Admitting: Family Medicine

## 2018-03-19 ENCOUNTER — Other Ambulatory Visit: Payer: Self-pay

## 2018-03-19 ENCOUNTER — Encounter: Payer: Self-pay | Admitting: Family Medicine

## 2018-03-19 DIAGNOSIS — J441 Chronic obstructive pulmonary disease with (acute) exacerbation: Secondary | ICD-10-CM | POA: Diagnosis not present

## 2018-03-19 DIAGNOSIS — I493 Ventricular premature depolarization: Secondary | ICD-10-CM

## 2018-03-19 DIAGNOSIS — J40 Bronchitis, not specified as acute or chronic: Secondary | ICD-10-CM | POA: Diagnosis not present

## 2018-03-19 DIAGNOSIS — I5031 Acute diastolic (congestive) heart failure: Secondary | ICD-10-CM | POA: Diagnosis not present

## 2018-03-19 DIAGNOSIS — S22078A Other fracture of T9-T10 vertebra, initial encounter for closed fracture: Secondary | ICD-10-CM | POA: Diagnosis not present

## 2018-03-19 MED ORDER — PREDNISONE 20 MG PO TABS: ORAL_TABLET | ORAL | 0 refills | Status: DC

## 2018-03-19 MED ORDER — HYDROCODONE-HOMATROPINE 5-1.5 MG/5ML PO SYRP: 5.0000 mL | ORAL_SOLUTION | Freq: Four times a day (QID) | ORAL | 0 refills | Status: DC | PRN

## 2018-03-19 MED ORDER — AMOXICILLIN 875 MG PO TABS: 875.0000 mg | ORAL_TABLET | Freq: Two times a day (BID) | ORAL | 0 refills | Status: DC

## 2018-03-19 NOTE — ED Triage Notes (Signed)
Per pt she has been having cough, congestion, sore throat and and aching with ears feeling stuffy. Pt has not been running fevers.
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
## 2018-03-28 ENCOUNTER — Ambulatory Visit: Payer: Medicare Other | Admitting: Student in an Organized Health Care Education/Training Program

## 2018-04-03 DIAGNOSIS — G4733 Obstructive sleep apnea (adult) (pediatric): Secondary | ICD-10-CM | POA: Diagnosis not present

## 2018-04-03 DIAGNOSIS — J449 Chronic obstructive pulmonary disease, unspecified: Secondary | ICD-10-CM | POA: Diagnosis not present

## 2018-04-06 ENCOUNTER — Other Ambulatory Visit: Payer: Self-pay

## 2018-04-06 ENCOUNTER — Encounter (HOSPITAL_COMMUNITY): Payer: Self-pay | Admitting: Emergency Medicine

## 2018-04-06 ENCOUNTER — Emergency Department (HOSPITAL_COMMUNITY)
Admission: EM | Admit: 2018-04-06 | Discharge: 2018-04-06 | Disposition: A | Discharge status: Home / Self Care | Payer: Medicare Other | Attending: Emergency Medicine | Admitting: Emergency Medicine

## 2018-04-06 ENCOUNTER — Emergency Department (HOSPITAL_COMMUNITY): Payer: Medicare Other

## 2018-04-06 DIAGNOSIS — E119 Type 2 diabetes mellitus without complications: Secondary | ICD-10-CM | POA: Diagnosis not present

## 2018-04-06 DIAGNOSIS — R05 Cough: Secondary | ICD-10-CM | POA: Diagnosis not present

## 2018-04-06 DIAGNOSIS — I252 Old myocardial infarction: Secondary | ICD-10-CM | POA: Diagnosis not present

## 2018-04-06 DIAGNOSIS — Z87891 Personal history of nicotine dependence: Secondary | ICD-10-CM | POA: Diagnosis not present

## 2018-04-06 DIAGNOSIS — J449 Chronic obstructive pulmonary disease, unspecified: Secondary | ICD-10-CM | POA: Diagnosis not present

## 2018-04-06 DIAGNOSIS — Z7984 Long term (current) use of oral hypoglycemic drugs: Secondary | ICD-10-CM | POA: Insufficient documentation

## 2018-04-06 DIAGNOSIS — R079 Chest pain, unspecified: Secondary | ICD-10-CM | POA: Diagnosis not present

## 2018-04-06 DIAGNOSIS — E039 Hypothyroidism, unspecified: Secondary | ICD-10-CM | POA: Diagnosis not present

## 2018-04-06 DIAGNOSIS — R0789 Other chest pain: Secondary | ICD-10-CM

## 2018-04-06 DIAGNOSIS — Z79899 Other long term (current) drug therapy: Secondary | ICD-10-CM | POA: Diagnosis not present

## 2018-04-06 DIAGNOSIS — Z7982 Long term (current) use of aspirin: Secondary | ICD-10-CM | POA: Insufficient documentation

## 2018-04-06 DIAGNOSIS — Z8673 Personal history of transient ischemic attack (TIA), and cerebral infarction without residual deficits: Secondary | ICD-10-CM | POA: Insufficient documentation

## 2018-04-06 DIAGNOSIS — I10 Essential (primary) hypertension: Secondary | ICD-10-CM | POA: Diagnosis not present

## 2018-04-06 DIAGNOSIS — J209 Acute bronchitis, unspecified: Secondary | ICD-10-CM | POA: Diagnosis not present

## 2018-04-06 LAB — BASIC METABOLIC PANEL WITH GFR
Anion gap: 9 (ref 5–15)
BUN: 18 mg/dL (ref 8–23)
CO2: 22 mmol/L (ref 22–32)
Calcium: 10.8 mg/dL — ABNORMAL HIGH (ref 8.9–10.3)
Chloride: 108 mmol/L (ref 98–111)
Creatinine, Ser: 0.96 mg/dL (ref 0.44–1.00)
GFR calc Af Amer: >60 mL/min
GFR calc non Af Amer: 56 mL/min — ABNORMAL LOW
Glucose, Bld: 216 mg/dL — ABNORMAL HIGH (ref 70–99)
Potassium: 4.2 mmol/L (ref 3.5–5.1)
Sodium: 139 mmol/L (ref 135–145)

## 2018-04-06 LAB — CBC
HCT: 37 % (ref 36.0–46.0)
Hemoglobin: 11.6 g/dL — ABNORMAL LOW (ref 12.0–15.0)
MCH: 27.2 pg (ref 26.0–34.0)
MCHC: 31.4 g/dL (ref 30.0–36.0)
MCV: 86.7 fL (ref 80.0–100.0)
Platelets: 253 K/uL (ref 150–400)
RBC: 4.27 MIL/uL (ref 3.87–5.11)
RDW: 15 % (ref 11.5–15.5)
WBC: 6.5 K/uL (ref 4.0–10.5)
nRBC: 0 % (ref 0.0–0.2)

## 2018-04-06 LAB — I-STAT TROPONIN, ED: Troponin i, poc: 0 ng/mL (ref 0.00–0.08)

## 2018-04-06 MED ORDER — AZITHROMYCIN 250 MG PO TABS: 250.0000 mg | ORAL_TABLET | Freq: Every day | ORAL | 0 refills | Status: DC

## 2018-04-06 MED ORDER — OXYCODONE-ACETAMINOPHEN 5-325 MG PO TABS
2.0000 | ORAL_TABLET | Freq: Once | ORAL | Status: AC
  Administered 2018-04-06: 2 via ORAL
  Filled 2018-04-06: qty 2

## 2018-04-06 MED ORDER — SODIUM CHLORIDE 0.9% FLUSH: 3.0000 mL | Freq: Once | INTRAVENOUS | Status: DC

## 2018-04-06 MED ORDER — OXYCODONE-ACETAMINOPHEN 5-325 MG PO TABS: 1.0000 | ORAL_TABLET | Freq: Four times a day (QID) | ORAL | 0 refills | Status: DC | PRN
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
## 2018-04-06 NOTE — ED Notes (Signed)
Patient verbalizes understanding of discharge instructions. Opportunity for questioning and answers were provided. Armband removed by staff, pt discharged from ED in wheelchair.  

## 2018-04-06 NOTE — Discharge Instructions (Signed)
Zithromax as prescribed.  Percocet as prescribed as needed for pain.

## 2018-04-06 NOTE — ED Triage Notes (Signed)
Pt reports coughing, congestion, headache, fever/chills, at home and states she is having pain ihn her chest and her right ribs.

## 2018-04-17 DIAGNOSIS — S22078A Other fracture of T9-T10 vertebra, initial encounter for closed fracture: Secondary | ICD-10-CM | POA: Diagnosis not present

## 2018-04-17 DIAGNOSIS — J441 Chronic obstructive pulmonary disease with (acute) exacerbation: Secondary | ICD-10-CM | POA: Diagnosis not present

## 2018-04-17 DIAGNOSIS — I5031 Acute diastolic (congestive) heart failure: Secondary | ICD-10-CM | POA: Diagnosis not present

## 2018-04-25 ENCOUNTER — Telehealth: Payer: Self-pay | Admitting: *Deleted

## 2018-04-25 MED ORDER — HYDROCODONE-HOMATROPINE 5-1.5 MG/5ML PO SYRP: 5.0000 mL | ORAL_SOLUTION | Freq: Every evening | ORAL | 0 refills | Status: DC | PRN

## 2018-04-25 NOTE — Telephone Encounter (Signed)
Patient called in stating her cough has returned. Had been to ED on 04/06/2018 for acute bronchitis and given z-pak. Patient completed course and felt better. Cough, productive of "watery" yellow/white sputum returned 2 days ago. Has taken OTC cough/flu med without relief. States she coughed all night long. Denies fever but c/o being cold and aching. Chest hurts with cough. Uses O2 prn and has had to use it more 2/2 SHOB. States eating and drinking ok. Has not traveled and has not been around anyone who has traveled. Would like to know what PCP recommends. Hubbard Hartshorn, RN, BSN

## 2018-04-25 NOTE — Telephone Encounter (Signed)
I spoke with Caitlyn Aguirre by phone. Her cough sounds like a viral bronchitis or post-infectious reactive airway disease. No fever, no significant sputum, no DOE, and doing well at home. Low risk for pneumonia. Low risk for COVID19. I advised supportive care. I will refill a cough syrup she found helpful, but gave precautions about the hydrocodone component. She understands. Come to clinic precautions also given for any worsening.

## 2018-05-04 DIAGNOSIS — G4733 Obstructive sleep apnea (adult) (pediatric): Secondary | ICD-10-CM | POA: Diagnosis not present

## 2018-05-04 DIAGNOSIS — J449 Chronic obstructive pulmonary disease, unspecified: Secondary | ICD-10-CM | POA: Diagnosis not present

## 2018-05-18 DIAGNOSIS — I5031 Acute diastolic (congestive) heart failure: Secondary | ICD-10-CM | POA: Diagnosis not present

## 2018-05-18 DIAGNOSIS — J441 Chronic obstructive pulmonary disease with (acute) exacerbation: Secondary | ICD-10-CM | POA: Diagnosis not present

## 2018-05-18 DIAGNOSIS — S22078A Other fracture of T9-T10 vertebra, initial encounter for closed fracture: Secondary | ICD-10-CM | POA: Diagnosis not present

## 2018-05-30 ENCOUNTER — Other Ambulatory Visit: Payer: Self-pay | Admitting: Student in an Organized Health Care Education/Training Program

## 2018-05-30 DIAGNOSIS — I10 Essential (primary) hypertension: Secondary | ICD-10-CM

## 2018-05-30 DIAGNOSIS — J438 Other emphysema: Secondary | ICD-10-CM

## 2018-06-02 ENCOUNTER — Encounter: Payer: Self-pay | Admitting: *Deleted

## 2018-06-03 DIAGNOSIS — J449 Chronic obstructive pulmonary disease, unspecified: Secondary | ICD-10-CM | POA: Diagnosis not present

## 2018-06-03 DIAGNOSIS — G4733 Obstructive sleep apnea (adult) (pediatric): Secondary | ICD-10-CM | POA: Diagnosis not present

## 2018-06-15 DIAGNOSIS — Z961 Presence of intraocular lens: Secondary | ICD-10-CM | POA: Diagnosis not present

## 2018-06-15 DIAGNOSIS — E119 Type 2 diabetes mellitus without complications: Secondary | ICD-10-CM | POA: Diagnosis not present

## 2018-06-15 DIAGNOSIS — H401132 Primary open-angle glaucoma, bilateral, moderate stage: Secondary | ICD-10-CM | POA: Diagnosis not present

## 2018-06-15 LAB — HM DIABETES EYE EXAM

## 2018-06-17 ENCOUNTER — Encounter: Payer: Self-pay | Admitting: *Deleted

## 2018-06-17 DIAGNOSIS — J441 Chronic obstructive pulmonary disease with (acute) exacerbation: Secondary | ICD-10-CM | POA: Diagnosis not present

## 2018-06-17 DIAGNOSIS — I5031 Acute diastolic (congestive) heart failure: Secondary | ICD-10-CM | POA: Diagnosis not present

## 2018-06-17 DIAGNOSIS — S22078A Other fracture of T9-T10 vertebra, initial encounter for closed fracture: Secondary | ICD-10-CM | POA: Diagnosis not present

## 2018-07-04 DIAGNOSIS — G4733 Obstructive sleep apnea (adult) (pediatric): Secondary | ICD-10-CM | POA: Diagnosis not present

## 2018-07-04 DIAGNOSIS — J449 Chronic obstructive pulmonary disease, unspecified: Secondary | ICD-10-CM | POA: Diagnosis not present

## 2018-07-10 ENCOUNTER — Inpatient Hospital Stay (HOSPITAL_COMMUNITY): Payer: Medicare Other

## 2018-07-10 ENCOUNTER — Encounter (HOSPITAL_COMMUNITY): Payer: Self-pay

## 2018-07-10 ENCOUNTER — Inpatient Hospital Stay (HOSPITAL_COMMUNITY)
Admission: EM | Admit: 2018-07-10 | Discharge: 2018-07-13 | DRG: 871 | Disposition: A | Discharge status: Home Health | Payer: Medicare Other | Attending: Internal Medicine | Admitting: Internal Medicine

## 2018-07-10 ENCOUNTER — Emergency Department (HOSPITAL_COMMUNITY): Payer: Medicare Other

## 2018-07-10 ENCOUNTER — Other Ambulatory Visit: Payer: Self-pay

## 2018-07-10 DIAGNOSIS — N179 Acute kidney failure, unspecified: Secondary | ICD-10-CM | POA: Diagnosis not present

## 2018-07-10 DIAGNOSIS — N390 Urinary tract infection, site not specified: Secondary | ICD-10-CM | POA: Diagnosis present

## 2018-07-10 DIAGNOSIS — I252 Old myocardial infarction: Secondary | ICD-10-CM

## 2018-07-10 DIAGNOSIS — Z9981 Dependence on supplemental oxygen: Secondary | ICD-10-CM

## 2018-07-10 DIAGNOSIS — I10 Essential (primary) hypertension: Secondary | ICD-10-CM | POA: Diagnosis not present

## 2018-07-10 DIAGNOSIS — Z6830 Body mass index (BMI) 30.0-30.9, adult: Secondary | ICD-10-CM

## 2018-07-10 DIAGNOSIS — R509 Fever, unspecified: Secondary | ICD-10-CM | POA: Diagnosis not present

## 2018-07-10 DIAGNOSIS — Z955 Presence of coronary angioplasty implant and graft: Secondary | ICD-10-CM | POA: Diagnosis not present

## 2018-07-10 DIAGNOSIS — Z7984 Long term (current) use of oral hypoglycemic drugs: Secondary | ICD-10-CM

## 2018-07-10 DIAGNOSIS — R0602 Shortness of breath: Secondary | ICD-10-CM | POA: Diagnosis not present

## 2018-07-10 DIAGNOSIS — K219 Gastro-esophageal reflux disease without esophagitis: Secondary | ICD-10-CM | POA: Diagnosis not present

## 2018-07-10 DIAGNOSIS — N3001 Acute cystitis with hematuria: Secondary | ICD-10-CM

## 2018-07-10 DIAGNOSIS — Z7982 Long term (current) use of aspirin: Secondary | ICD-10-CM

## 2018-07-10 DIAGNOSIS — J449 Chronic obstructive pulmonary disease, unspecified: Secondary | ICD-10-CM | POA: Diagnosis present

## 2018-07-10 DIAGNOSIS — J9621 Acute and chronic respiratory failure with hypoxia: Secondary | ICD-10-CM | POA: Diagnosis present

## 2018-07-10 DIAGNOSIS — A419 Sepsis, unspecified organism: Secondary | ICD-10-CM | POA: Diagnosis present

## 2018-07-10 DIAGNOSIS — E1165 Type 2 diabetes mellitus with hyperglycemia: Secondary | ICD-10-CM | POA: Diagnosis not present

## 2018-07-10 DIAGNOSIS — R6520 Severe sepsis without septic shock: Secondary | ICD-10-CM | POA: Diagnosis present

## 2018-07-10 DIAGNOSIS — I110 Hypertensive heart disease with heart failure: Secondary | ICD-10-CM | POA: Diagnosis present

## 2018-07-10 DIAGNOSIS — A4151 Sepsis due to Escherichia coli [E. coli]: Secondary | ICD-10-CM | POA: Diagnosis not present

## 2018-07-10 DIAGNOSIS — J440 Chronic obstructive pulmonary disease with acute lower respiratory infection: Secondary | ICD-10-CM | POA: Diagnosis present

## 2018-07-10 DIAGNOSIS — Z20828 Contact with and (suspected) exposure to other viral communicable diseases: Secondary | ICD-10-CM | POA: Diagnosis present

## 2018-07-10 DIAGNOSIS — N3000 Acute cystitis without hematuria: Secondary | ICD-10-CM | POA: Diagnosis not present

## 2018-07-10 DIAGNOSIS — F329 Major depressive disorder, single episode, unspecified: Secondary | ICD-10-CM | POA: Diagnosis present

## 2018-07-10 DIAGNOSIS — G4733 Obstructive sleep apnea (adult) (pediatric): Secondary | ICD-10-CM | POA: Diagnosis present

## 2018-07-10 DIAGNOSIS — J189 Pneumonia, unspecified organism: Secondary | ICD-10-CM | POA: Diagnosis present

## 2018-07-10 DIAGNOSIS — E669 Obesity, unspecified: Secondary | ICD-10-CM | POA: Diagnosis present

## 2018-07-10 DIAGNOSIS — I428 Other cardiomyopathies: Secondary | ICD-10-CM | POA: Diagnosis not present

## 2018-07-10 DIAGNOSIS — I2510 Atherosclerotic heart disease of native coronary artery without angina pectoris: Secondary | ICD-10-CM | POA: Diagnosis not present

## 2018-07-10 DIAGNOSIS — M353 Polymyalgia rheumatica: Secondary | ICD-10-CM | POA: Diagnosis present

## 2018-07-10 DIAGNOSIS — R000 Tachycardia, unspecified: Secondary | ICD-10-CM | POA: Diagnosis not present

## 2018-07-10 DIAGNOSIS — I371 Nonrheumatic pulmonary valve insufficiency: Secondary | ICD-10-CM | POA: Diagnosis not present

## 2018-07-10 DIAGNOSIS — J181 Lobar pneumonia, unspecified organism: Secondary | ICD-10-CM | POA: Diagnosis not present

## 2018-07-10 DIAGNOSIS — I509 Heart failure, unspecified: Secondary | ICD-10-CM | POA: Diagnosis present

## 2018-07-10 DIAGNOSIS — E785 Hyperlipidemia, unspecified: Secondary | ICD-10-CM | POA: Diagnosis not present

## 2018-07-10 DIAGNOSIS — Z8673 Personal history of transient ischemic attack (TIA), and cerebral infarction without residual deficits: Secondary | ICD-10-CM

## 2018-07-10 DIAGNOSIS — Z79899 Other long term (current) drug therapy: Secondary | ICD-10-CM

## 2018-07-10 DIAGNOSIS — B9689 Other specified bacterial agents as the cause of diseases classified elsewhere: Secondary | ICD-10-CM | POA: Diagnosis present

## 2018-07-10 DIAGNOSIS — Z87891 Personal history of nicotine dependence: Secondary | ICD-10-CM

## 2018-07-10 DIAGNOSIS — R05 Cough: Secondary | ICD-10-CM

## 2018-07-10 DIAGNOSIS — R059 Cough, unspecified: Secondary | ICD-10-CM

## 2018-07-10 DIAGNOSIS — E039 Hypothyroidism, unspecified: Secondary | ICD-10-CM | POA: Diagnosis not present

## 2018-07-10 DIAGNOSIS — R0600 Dyspnea, unspecified: Secondary | ICD-10-CM

## 2018-07-10 DIAGNOSIS — Z79890 Hormone replacement therapy (postmenopausal): Secondary | ICD-10-CM

## 2018-07-10 DIAGNOSIS — E1169 Type 2 diabetes mellitus with other specified complication: Secondary | ICD-10-CM | POA: Diagnosis present

## 2018-07-10 LAB — FERRITIN: Ferritin: 78 ng/mL (ref 11–307)

## 2018-07-10 LAB — URINALYSIS, ROUTINE W REFLEX MICROSCOPIC
Bilirubin Urine: NEGATIVE
Glucose, UA: NEGATIVE mg/dL
Ketones, ur: NEGATIVE mg/dL
Nitrite: NEGATIVE
Protein, ur: 30 mg/dL — AB
Specific Gravity, Urine: 1.012 (ref 1.005–1.030)
pH: 5 (ref 5.0–8.0)

## 2018-07-10 LAB — BRAIN NATRIURETIC PEPTIDE: B Natriuretic Peptide: 253.2 pg/mL — ABNORMAL HIGH (ref 0.0–100.0)

## 2018-07-10 LAB — COMPREHENSIVE METABOLIC PANEL
ALT: 9 U/L (ref 0–44)
ALT: 12 U/L (ref 0–44)
AST: 17 U/L (ref 15–41)
AST: 17 U/L (ref 15–41)
Albumin: 3.8 g/dL (ref 3.5–5.0)
Albumin: 3.4 g/dL — ABNORMAL LOW (ref 3.5–5.0)
Alkaline Phosphatase: 127 U/L — ABNORMAL HIGH (ref 38–126)
Alkaline Phosphatase: 106 U/L (ref 38–126)
Anion gap: 10 (ref 5–15)
Anion gap: 10 (ref 5–15)
BUN: 24 mg/dL — ABNORMAL HIGH (ref 8–23)
BUN: 27 mg/dL — ABNORMAL HIGH (ref 8–23)
CO2: 20 mmol/L — ABNORMAL LOW (ref 22–32)
CO2: 20 mmol/L — ABNORMAL LOW (ref 22–32)
Calcium: 9.2 mg/dL (ref 8.9–10.3)
Calcium: 9 mg/dL (ref 8.9–10.3)
Chloride: 108 mmol/L (ref 98–111)
Chloride: 107 mmol/L (ref 98–111)
Creatinine, Ser: 1.4 mg/dL — ABNORMAL HIGH (ref 0.44–1.00)
Creatinine, Ser: 1.51 mg/dL — ABNORMAL HIGH (ref 0.44–1.00)
GFR calc Af Amer: 41 mL/min — ABNORMAL LOW (ref >60)
GFR calc Af Amer: 37 mL/min — ABNORMAL LOW (ref >60)
GFR calc non Af Amer: 35 mL/min — ABNORMAL LOW (ref >60)
GFR calc non Af Amer: 32 mL/min — ABNORMAL LOW (ref >60)
Glucose, Bld: 243 mg/dL — ABNORMAL HIGH (ref 70–99)
Glucose, Bld: 227 mg/dL — ABNORMAL HIGH (ref 70–99)
Potassium: 3.9 mmol/L (ref 3.5–5.1)
Potassium: 4.1 mmol/L (ref 3.5–5.1)
Sodium: 138 mmol/L (ref 135–145)
Sodium: 137 mmol/L (ref 135–145)
Total Bilirubin: 1.4 mg/dL — ABNORMAL HIGH (ref 0.3–1.2)
Total Bilirubin: 1 mg/dL (ref 0.3–1.2)
Total Protein: 7.6 g/dL (ref 6.5–8.1)
Total Protein: 7 g/dL (ref 6.5–8.1)

## 2018-07-10 LAB — INFLUENZA PANEL BY PCR (TYPE A & B)
Influenza A By PCR: NEGATIVE
Influenza B By PCR: NEGATIVE

## 2018-07-10 LAB — SARS CORONAVIRUS 2 BY RT PCR (HOSPITAL ORDER, PERFORMED IN ~~LOC~~ HOSPITAL LAB)
SARS Coronavirus 2: NEGATIVE
SARS Coronavirus 2: NEGATIVE

## 2018-07-10 LAB — CBC WITH DIFFERENTIAL/PLATELET
Abs Immature Granulocytes: 0.09 10*3/uL — ABNORMAL HIGH (ref 0.00–0.07)
Basophils Absolute: 0 10*3/uL (ref 0.0–0.1)
Basophils Relative: 0 %
Eosinophils Absolute: 0 10*3/uL (ref 0.0–0.5)
Eosinophils Relative: 0 %
HCT: 33.2 % — ABNORMAL LOW (ref 36.0–46.0)
Hemoglobin: 11 g/dL — ABNORMAL LOW (ref 12.0–15.0)
Immature Granulocytes: 1 %
Lymphocytes Relative: 3 %
Lymphs Abs: 0.5 10*3/uL — ABNORMAL LOW (ref 0.7–4.0)
MCH: 29.3 pg (ref 26.0–34.0)
MCHC: 33.1 g/dL (ref 30.0–36.0)
MCV: 88.5 fL (ref 80.0–100.0)
Monocytes Absolute: 0.8 10*3/uL (ref 0.1–1.0)
Monocytes Relative: 6 %
Neutro Abs: 12.9 10*3/uL — ABNORMAL HIGH (ref 1.7–7.7)
Neutrophils Relative %: 90 %
Platelets: 164 10*3/uL (ref 150–400)
RBC: 3.75 MIL/uL — ABNORMAL LOW (ref 3.87–5.11)
RDW: 15.5 % (ref 11.5–15.5)
WBC: 14.3 10*3/uL — ABNORMAL HIGH (ref 4.0–10.5)
nRBC: 0 % (ref 0.0–0.2)

## 2018-07-10 LAB — D-DIMER, QUANTITATIVE: D-Dimer, Quant: 2.73 ug/mL-FEU — ABNORMAL HIGH (ref 0.00–0.50)

## 2018-07-10 LAB — LACTIC ACID, PLASMA
Lactic Acid, Venous: 1.6 mmol/L (ref 0.5–1.9)
Lactic Acid, Venous: 1.3 mmol/L (ref 0.5–1.9)
Lactic Acid, Venous: 1.2 mmol/L (ref 0.5–1.9)

## 2018-07-10 LAB — PROCALCITONIN: Procalcitonin: 21.41 ng/mL

## 2018-07-10 LAB — HEMOGLOBIN A1C
Hgb A1c MFr Bld: 6.9 % — ABNORMAL HIGH (ref 4.8–5.6)
Mean Plasma Glucose: 151.33 mg/dL

## 2018-07-10 LAB — FIBRINOGEN: Fibrinogen: 647 mg/dL — ABNORMAL HIGH (ref 210–475)

## 2018-07-10 LAB — CK: Total CK: 117 U/L (ref 38–234)

## 2018-07-10 LAB — TROPONIN I: Troponin I: 0.14 ng/mL (ref <0.03)

## 2018-07-10 LAB — STREP PNEUMONIAE URINARY ANTIGEN: Strep Pneumo Urinary Antigen: NEGATIVE

## 2018-07-10 LAB — C-REACTIVE PROTEIN: CRP: 19.4 mg/dL — ABNORMAL HIGH (ref <1.0)

## 2018-07-10 LAB — GLUCOSE, CAPILLARY
Glucose-Capillary: 182 mg/dL — ABNORMAL HIGH (ref 70–99)
Glucose-Capillary: 194 mg/dL — ABNORMAL HIGH (ref 70–99)
Glucose-Capillary: 120 mg/dL — ABNORMAL HIGH (ref 70–99)
Glucose-Capillary: 201 mg/dL — ABNORMAL HIGH (ref 70–99)

## 2018-07-10 LAB — TSH: TSH: 1.176 u[IU]/mL (ref 0.350–4.500)

## 2018-07-10 LAB — LACTATE DEHYDROGENASE: LDH: 170 U/L (ref 98–192)

## 2018-07-10 MED ORDER — FUROSEMIDE 10 MG/ML IJ SOLN
20.0000 mg | Freq: Once | INTRAMUSCULAR | Status: AC
  Administered 2018-07-10: 18:00:00 20 mg via INTRAVENOUS

## 2018-07-10 MED ORDER — FUROSEMIDE 10 MG/ML IJ SOLN
INTRAMUSCULAR | Status: AC
  Filled 2018-07-10: qty 4

## 2018-07-10 MED ORDER — METHYLPREDNISOLONE SODIUM SUCC 125 MG IJ SOLR
60.0000 mg | Freq: Two times a day (BID) | INTRAMUSCULAR | Status: DC
  Administered 2018-07-11: 60 mg via INTRAVENOUS
  Filled 2018-07-10: qty 2

## 2018-07-10 MED ORDER — PAROXETINE HCL 20 MG PO TABS
30.0000 mg | ORAL_TABLET | Freq: Every day | ORAL | Status: DC
  Administered 2018-07-10: 10:00:00 30 mg via ORAL
  Filled 2018-07-10: qty 1

## 2018-07-10 MED ORDER — SODIUM CHLORIDE 0.9 % IV SOLN
500.0000 mg | INTRAVENOUS | Status: DC
  Administered 2018-07-10: 04:00:00 500 mg via INTRAVENOUS
  Filled 2018-07-10: qty 500

## 2018-07-10 MED ORDER — ACETAMINOPHEN 325 MG PO TABS
650.0000 mg | ORAL_TABLET | ORAL | Status: DC | PRN
  Administered 2018-07-10: 650 mg via ORAL
  Filled 2018-07-10: qty 2

## 2018-07-10 MED ORDER — INSULIN ASPART 100 UNIT/ML ~~LOC~~ SOLN
0.0000 [IU] | Freq: Every day | SUBCUTANEOUS | Status: DC
  Administered 2018-07-10: 2 [IU] via SUBCUTANEOUS
  Administered 2018-07-11 – 2018-07-12 (×2): 3 [IU] via SUBCUTANEOUS

## 2018-07-10 MED ORDER — IPRATROPIUM-ALBUTEROL 0.5-2.5 (3) MG/3ML IN SOLN
3.0000 mL | RESPIRATORY_TRACT | Status: DC | PRN
  Administered 2018-07-10: 18:00:00 3 mL via RESPIRATORY_TRACT
  Filled 2018-07-10: qty 3

## 2018-07-10 MED ORDER — METHYLPREDNISOLONE SODIUM SUCC 125 MG IJ SOLR
125.0000 mg | Freq: Once | INTRAMUSCULAR | Status: AC
  Administered 2018-07-10: 125 mg via INTRAVENOUS

## 2018-07-10 MED ORDER — CHOLECALCIFEROL 10 MCG (400 UNIT) PO TABS
400.0000 [IU] | ORAL_TABLET | Freq: Every day | ORAL | Status: DC
  Administered 2018-07-10 – 2018-07-13 (×4): 400 [IU] via ORAL
  Filled 2018-07-10 (×4): qty 1

## 2018-07-10 MED ORDER — INSULIN ASPART 100 UNIT/ML ~~LOC~~ SOLN
0.0000 [IU] | Freq: Three times a day (TID) | SUBCUTANEOUS | Status: DC
  Administered 2018-07-10 (×2): 2 [IU] via SUBCUTANEOUS
  Administered 2018-07-11: 7 [IU] via SUBCUTANEOUS
  Administered 2018-07-11: 9 [IU] via SUBCUTANEOUS
  Administered 2018-07-11: 5 [IU] via SUBCUTANEOUS
  Administered 2018-07-12 (×3): 2 [IU] via SUBCUTANEOUS
  Administered 2018-07-13: 1 [IU] via SUBCUTANEOUS

## 2018-07-10 MED ORDER — LEVOTHYROXINE SODIUM 88 MCG PO TABS
88.0000 ug | ORAL_TABLET | Freq: Every day | ORAL | Status: DC
  Administered 2018-07-11 – 2018-07-13 (×3): 88 ug via ORAL
  Filled 2018-07-10 (×3): qty 1

## 2018-07-10 MED ORDER — SODIUM CHLORIDE 0.9 % IV SOLN
500.0000 mg | INTRAVENOUS | Status: DC
  Administered 2018-07-11 – 2018-07-13 (×3): 500 mg via INTRAVENOUS
  Filled 2018-07-10 (×3): qty 500

## 2018-07-10 MED ORDER — SODIUM CHLORIDE 0.9 % IV BOLUS (SEPSIS)
1000.0000 mL | Freq: Once | INTRAVENOUS | Status: AC
  Administered 2018-07-10: 04:00:00 1000 mL via INTRAVENOUS

## 2018-07-10 MED ORDER — SODIUM CHLORIDE 0.9 % IV SOLN
1.0000 g | INTRAVENOUS | Status: DC
  Filled 2018-07-10: qty 10

## 2018-07-10 MED ORDER — ATORVASTATIN CALCIUM 40 MG PO TABS
40.0000 mg | ORAL_TABLET | Freq: Every day | ORAL | Status: DC
  Administered 2018-07-11 – 2018-07-12 (×2): 40 mg via ORAL
  Filled 2018-07-10 (×2): qty 1

## 2018-07-10 MED ORDER — ENSURE ENLIVE PO LIQD
237.0000 mL | Freq: Two times a day (BID) | ORAL | Status: DC
  Administered 2018-07-10 – 2018-07-11 (×3): 237 mL via ORAL

## 2018-07-10 MED ORDER — CARVEDILOL 6.25 MG PO TABS
6.2500 mg | ORAL_TABLET | Freq: Two times a day (BID) | ORAL | Status: DC
  Administered 2018-07-10 – 2018-07-13 (×6): 6.25 mg via ORAL
  Filled 2018-07-10 (×6): qty 1

## 2018-07-10 MED ORDER — PANTOPRAZOLE SODIUM 40 MG PO TBEC
40.0000 mg | DELAYED_RELEASE_TABLET | Freq: Every day | ORAL | Status: DC
  Administered 2018-07-10 – 2018-07-13 (×4): 40 mg via ORAL
  Filled 2018-07-10 (×4): qty 1

## 2018-07-10 MED ORDER — ACETAMINOPHEN 325 MG PO TABS
650.0000 mg | ORAL_TABLET | Freq: Once | ORAL | Status: AC
  Administered 2018-07-10: 04:00:00 650 mg via ORAL
  Filled 2018-07-10: qty 2

## 2018-07-10 MED ORDER — ACETAMINOPHEN 10 MG/ML IV SOLN
1000.0000 mg | Freq: Once | INTRAVENOUS | Status: AC
  Administered 2018-07-10: 1000 mg via INTRAVENOUS
  Filled 2018-07-10: qty 100

## 2018-07-10 MED ORDER — ENOXAPARIN SODIUM 40 MG/0.4ML ~~LOC~~ SOLN
40.0000 mg | SUBCUTANEOUS | Status: DC
  Administered 2018-07-10 – 2018-07-12 (×3): 40 mg via SUBCUTANEOUS
  Filled 2018-07-10 (×4): qty 0.4

## 2018-07-10 MED ORDER — METHYLPREDNISOLONE SODIUM SUCC 125 MG IJ SOLR
INTRAMUSCULAR | Status: AC
  Filled 2018-07-10: qty 2

## 2018-07-10 MED ORDER — IPRATROPIUM-ALBUTEROL 0.5-2.5 (3) MG/3ML IN SOLN
3.0000 mL | Freq: Four times a day (QID) | RESPIRATORY_TRACT | Status: DC
  Administered 2018-07-10 – 2018-07-11 (×5): 3 mL via RESPIRATORY_TRACT
  Filled 2018-07-10 (×5): qty 3

## 2018-07-10 MED ORDER — SODIUM CHLORIDE 0.9 % IV SOLN
2.0000 g | INTRAVENOUS | Status: DC
  Administered 2018-07-10: 2 g via INTRAVENOUS
  Filled 2018-07-10: qty 20
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
## 2018-07-10 NOTE — Plan of Care (Signed)
  Problem: Education: Goal: Knowledge of General Education information will improve Description Including pain rating scale, medication(s)/side effects and non-pharmacologic comfort measures Outcome: Progressing   

## 2018-07-10 NOTE — ED Notes (Signed)
Pt Contacts:  Evie Lacks McPherson [phone]

## 2018-07-10 NOTE — Progress Notes (Signed)
CRITICAL VALUE ALERT  Critical Value: troponin 0.14  Date & Time Notied:  07/10/2018 @ 2044  Provider Notified:yes X. Blount  Orders Received/Actions taken: None.

## 2018-07-10 NOTE — ED Notes (Signed)
2 IV attempts from the writer of this note. Was able to obtain blood.

## 2018-07-10 NOTE — ED Notes (Signed)
I have just phoned report to [place], RN on [place] and will transport shortly.

## 2018-07-10 NOTE — Significant Event (Signed)
  Rapid Response Event Note  Overview: Time Called: 1800 Arrival Time: 1803 Event Type: Respiratory  Initial Focused Assessment: Upon arrival to room patient covered in blankets and shivering complaining of being cold. Pt alert and oriented.  ST low 100s on monitor BP elevated 167/99 91% on 4L [place] Inspiratory wheeze LLL Oral temp 100.9  Interventions: Duoneb given, MD notified. Orders received for lasix, solumedrol, CXR, Lactic acid and repeat rapid covid test. Repeat rectal temp 104.2 @ 1817. Orders received for 1000 IV tylenol x1 with PRN oral tylenol.   Plan of Care (if not transferred): Bedside RN to carry out orders and monitor patient and temperature. RN to call rapid if pt declines. Rapid to recheck on patient at shift change to assess need for SDU bed.  *Recheck @2015 : Pt temperature remains elevated at 102.4 rectal. RN reports pt has stopped shivering and respiratory status has improved. RN to continue to monitor and treat temp. Advised to call rapid response with any concerns about pt.         Wray Kearns, RN

## 2018-07-10 NOTE — ED Triage Notes (Addendum)
Pt BIB [facility] from home. Pt c/o SHOB that began at 0100 and progressively got worse. Pt wears 2L of O2 at home. Pt also c/o productive cough. Pt denies CP or Abd pain. EMS placed pt on NRB with an O2 saturation of 94%. Pt was weaned back to baseline O2 of 2L.  Pt rectal temp on arrival was 103.4

## 2018-07-10 NOTE — ED Notes (Signed)
She arouses easily from sleep and as I write this, is being examined by our hospitalist.

## 2018-07-10 NOTE — Progress Notes (Signed)
Around 1750 after ambulating to [place] (which pt had previously done before with no complications), pt c/o being cold with very extreme shivers. Oral temp 98.4, Rectal temp 100.9 at 1757. RR 34, O2 Sats 84-97% 2L/[place]. Respiratory notified. PRN Duoneb given by RN d/t pt's c/o SOB and severe wheezing. MD notified and RR called d/t sudden dramatic change in status. Pt in obvious respiratory distress. Rectal temp at 1817 increased to 104.2. IV Tylenol 1000mg  given. Rectal temp still 102.4 at 2003. Pt will good UOP at 20mg  IV Lasix. Pt now resting comfortably in bed, asking for water. No signs of distress. O2 Sats stable on 4L/[place]. Second COVID swab received and will be taken to lab.

## 2018-07-10 NOTE — ED Provider Notes (Signed)
Assumed care from Dr. Dina Rich, elderly female with fever, cough, evidence of UTI, and labs revealing UTI--will admit for ?early sepsis, awaiting call from hospitalist.  Admitted to hospital service for further care.   Maudie Flakes, MD 07/13/18 [phone]

## 2018-07-10 NOTE — ED Notes (Signed)
Bed: [facility]20 Expected date:  Expected time:  Means of arrival:  Comments: EMS 83 yo female from home SOB 96% 2l/[place] Zofran IV
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
## 2018-07-11 ENCOUNTER — Inpatient Hospital Stay (HOSPITAL_COMMUNITY): Payer: Medicare Other

## 2018-07-11 DIAGNOSIS — I371 Nonrheumatic pulmonary valve insufficiency: Secondary | ICD-10-CM

## 2018-07-11 DIAGNOSIS — R6520 Severe sepsis without septic shock: Secondary | ICD-10-CM

## 2018-07-11 DIAGNOSIS — A4151 Sepsis due to Escherichia coli [E. coli]: Principal | ICD-10-CM

## 2018-07-11 LAB — RESPIRATORY PANEL BY PCR

## 2018-07-11 LAB — BLOOD CULTURE ID PANEL (REFLEXED)

## 2018-07-11 LAB — BASIC METABOLIC PANEL
Anion gap: 11 (ref 5–15)
BUN: 30 mg/dL — ABNORMAL HIGH (ref 8–23)
CO2: 18 mmol/L — ABNORMAL LOW (ref 22–32)
Calcium: 9.1 mg/dL (ref 8.9–10.3)
Chloride: 108 mmol/L (ref 98–111)
Creatinine, Ser: 1.28 mg/dL — ABNORMAL HIGH (ref 0.44–1.00)
GFR calc Af Amer: 45 mL/min — ABNORMAL LOW (ref >60)
GFR calc non Af Amer: 39 mL/min — ABNORMAL LOW (ref >60)
Glucose, Bld: 401 mg/dL — ABNORMAL HIGH (ref 70–99)
Potassium: 4 mmol/L (ref 3.5–5.1)
Sodium: 137 mmol/L (ref 135–145)

## 2018-07-11 LAB — GLUCOSE, CAPILLARY
Glucose-Capillary: 315 mg/dL — ABNORMAL HIGH (ref 70–99)
Glucose-Capillary: 375 mg/dL — ABNORMAL HIGH (ref 70–99)
Glucose-Capillary: 288 mg/dL — ABNORMAL HIGH (ref 70–99)
Glucose-Capillary: 255 mg/dL — ABNORMAL HIGH (ref 70–99)

## 2018-07-11 LAB — CBC
HCT: 31.7 % — ABNORMAL LOW (ref 36.0–46.0)
Hemoglobin: 10.3 g/dL — ABNORMAL LOW (ref 12.0–15.0)
MCH: 29.3 pg (ref 26.0–34.0)
MCHC: 32.5 g/dL (ref 30.0–36.0)
MCV: 90.1 fL (ref 80.0–100.0)
Platelets: 151 10*3/uL (ref 150–400)
RBC: 3.52 MIL/uL — ABNORMAL LOW (ref 3.87–5.11)
RDW: 15.6 % — ABNORMAL HIGH (ref 11.5–15.5)
WBC: 12.2 10*3/uL — ABNORMAL HIGH (ref 4.0–10.5)
nRBC: 0 % (ref 0.0–0.2)

## 2018-07-11 LAB — ECHOCARDIOGRAM COMPLETE
Height: 68 in
Weight: 2240 oz

## 2018-07-11 LAB — URINE CULTURE: Culture: <10000 — AB

## 2018-07-11 LAB — HIV ANTIBODY (ROUTINE TESTING W REFLEX): HIV Screen 4th Generation wRfx: NONREACTIVE

## 2018-07-11 MED ORDER — DIPHENHYDRAMINE HCL 25 MG PO CAPS
25.0000 mg | ORAL_CAPSULE | Freq: Every evening | ORAL | Status: DC | PRN
  Administered 2018-07-11 – 2018-07-12 (×2): 25 mg via ORAL
  Filled 2018-07-11 (×2): qty 1

## 2018-07-11 MED ORDER — ENSURE MAX PROTEIN PO LIQD
11.0000 [oz_av] | Freq: Two times a day (BID) | ORAL | Status: DC
  Administered 2018-07-11 – 2018-07-13 (×4): 11 [oz_av] via ORAL
  Filled 2018-07-11 (×5): qty 330

## 2018-07-11 MED ORDER — SODIUM CHLORIDE 0.9 % IV SOLN
2.0000 g | Freq: Every day | INTRAVENOUS | Status: DC
  Administered 2018-07-11 – 2018-07-13 (×3): 2 g via INTRAVENOUS
  Filled 2018-07-11 (×3): qty 2

## 2018-07-11 MED ORDER — INSULIN GLARGINE 100 UNIT/ML ~~LOC~~ SOLN
20.0000 [IU] | Freq: Every day | SUBCUTANEOUS | Status: DC
  Administered 2018-07-11 – 2018-07-13 (×3): 20 [IU] via SUBCUTANEOUS
  Filled 2018-07-11 (×3): qty 0.2

## 2018-07-11 MED ORDER — ALBUTEROL SULFATE (2.5 MG/3ML) 0.083% IN NEBU: 2.5000 mg | INHALATION_SOLUTION | Freq: Four times a day (QID) | RESPIRATORY_TRACT | Status: DC | PRN

## 2018-07-11 MED ORDER — FUROSEMIDE 10 MG/ML IJ SOLN
40.0000 mg | Freq: Once | INTRAMUSCULAR | Status: AC
  Administered 2018-07-11: 40 mg via INTRAVENOUS
  Filled 2018-07-11: qty 4

## 2018-07-11 MED ORDER — IPRATROPIUM-ALBUTEROL 0.5-2.5 (3) MG/3ML IN SOLN
3.0000 mL | Freq: Three times a day (TID) | RESPIRATORY_TRACT | Status: DC
  Administered 2018-07-12 – 2018-07-13 (×4): 3 mL via RESPIRATORY_TRACT
  Filled 2018-07-11 (×4): qty 3

## 2018-07-11 MED ORDER — METHYLPREDNISOLONE SODIUM SUCC 40 MG IJ SOLR: 40.0000 mg | Freq: Two times a day (BID) | INTRAMUSCULAR | Status: DC

## 2018-07-11 MED ORDER — INSULIN ASPART 100 UNIT/ML ~~LOC~~ SOLN
4.0000 [IU] | Freq: Three times a day (TID) | SUBCUTANEOUS | Status: DC
  Administered 2018-07-11 – 2018-07-13 (×5): 4 [IU] via SUBCUTANEOUS

## 2018-07-11 NOTE — Progress Notes (Signed)
PHARMACY - PHYSICIAN COMMUNICATION CRITICAL VALUE ALERT - BLOOD CULTURE IDENTIFICATION (BCID)  Caitlyn Aguirre is an 83 y.o. female who presented to [facility] on 07/10/2018 with a chief complaint of cough, fever, SOB  Assessment:  Patient was being treated for CAP.  Luckily got 2gm of Ceftriaxone x1 in ED, because BCID shows E. Coli. (include suspected source if known)  Name of physician (or Provider) Contacted: none  Current antibiotics: Ceftriaxone 1gm iv q24hr  Changes to prescribed antibiotics recommended:  Make change to Ceftiaxone 2gm iv q24hr per protocol.    Results for orders placed or performed during the hospital encounter of 07/10/18  Blood Culture ID Panel (Reflexed) (Collected: 07/10/2018  3:16 AM)  Result Value Ref Range   Enterococcus species NOT DETECTED NOT DETECTED   Listeria monocytogenes NOT DETECTED NOT DETECTED   Staphylococcus species NOT DETECTED NOT DETECTED   Staphylococcus aureus (BCID) NOT DETECTED NOT DETECTED   Streptococcus species NOT DETECTED NOT DETECTED   Streptococcus agalactiae NOT DETECTED NOT DETECTED   Streptococcus pneumoniae NOT DETECTED NOT DETECTED   Streptococcus pyogenes NOT DETECTED NOT DETECTED   Acinetobacter baumannii NOT DETECTED NOT DETECTED   Enterobacteriaceae species DETECTED (A) NOT DETECTED   Enterobacter cloacae complex NOT DETECTED NOT DETECTED   Escherichia coli DETECTED (A) NOT DETECTED   Klebsiella oxytoca NOT DETECTED NOT DETECTED   Klebsiella pneumoniae NOT DETECTED NOT DETECTED   Proteus species NOT DETECTED NOT DETECTED   Serratia marcescens NOT DETECTED NOT DETECTED   Carbapenem resistance NOT DETECTED NOT DETECTED   Haemophilus influenzae NOT DETECTED NOT DETECTED   Neisseria meningitidis NOT DETECTED NOT DETECTED   Pseudomonas aeruginosa NOT DETECTED NOT DETECTED   Candida albicans NOT DETECTED NOT DETECTED   Candida glabrata NOT DETECTED NOT DETECTED   Candida krusei NOT DETECTED NOT DETECTED   Candida  parapsilosis NOT DETECTED NOT DETECTED   Candida tropicalis NOT DETECTED NOT DETECTED    [name] 07/11/2018  6:42 AM

## 2018-07-11 NOTE — Progress Notes (Addendum)
PROGRESS NOTE    Caitlyn Aguirre  KGU:[id number] DOB: [birth year] DOA: 07/10/2018 PCP: Axel Filler, MD   Brief Narrative: Patient is a 83 year old female with history of COPD, on home oxygen at 2 L/min as needed, diabetes mellitus, hypertension, hyperlipidemia, hypothyroidism who presented to the emergency room with fever of 103.4 F, chills, shortness of breath for last 2 days.Patient was found to be febrile on presentation.  Chest x-ray showed possible left lower lobe pneumonia.  Urinalysis was suggestive of urinary tract infection.  Blood cultures have been growing E. coli now.  Currently on IV antibiotics.  Assessment & Plan:   Principal Problem:   Sepsis ([place]) Active Problems:   Hypothyroidism   Type 2 diabetes mellitus with other specified complication (HCC)   Essential hypertension   COPD ([place])   GERD (gastroesophageal reflux disease)   Acute lower UTI   CAP (community acquired pneumonia)   AKI (acute kidney injury) ([place])  E. coli bacteremia: Awaiting final culture report.  Most likely source is urine.  Presented with fever.  Continue ceftriaxone.  Urinary tract infection: Most likely source for bacteremia.  Urine culture showed 70,000 colonies of E. coli.  Continue ceftriaxone.  Suspected pneumonia: Chest x-ray showed possible left lower lobe pneumonia.  Continue ceftriaxone with azithromycin.  Currently on 2 L of oxygen per minute which is her baseline. Auscultation revealed bibasilar crackles.  History of COPD: Past smoker.  On 2 L of oxygen per minute at home as needed.  No wheezes at present.  Continue bronchodilators as needed.  Steroids will be stopped.CXR showed cardiomegaly with pulmonary artery enlargement and interstitial lung disease.  Acute kidney injury: Baseline creatinine normal.  Presented with acute kidney injury.  Kidney function improving.  Suspected volume overload.  She was given Lasix 20 mg IV once yesterday.  We will give her a dose of 40 mg of  Lasix today.  Hypothyroidism: Continue Synthyroid.  Diabetes mellitus type 2: Hemoglobin A1c of 6.9.  Hyperglycemic this morning.  Continue sliding scale and Lantus.  On metformin at home.  Hypertension: Blood pressure soft on presentation.  Antihypertensives held.     Nutrition Problem: Increased nutrient needs Etiology: catabolic illness(COPD, CHF)      DVT prophylaxis:Lovenox Code Status: Full Family Communication: Will call family later today Disposition Plan: Home after full work-up   Consultants: None  Procedures: None  Antimicrobials:  Anti-infectives (From admission, onward)   Start     Dose/Rate Route Frequency Ordered Stop   07/11/18 1000  cefTRIAXone (ROCEPHIN) 1 g in sodium chloride 0.9 % 100 mL IVPB  Status:  Discontinued     1 g 200 mL/hr over 30 Minutes Intravenous Every 24 hours 07/10/18 0853 07/11/18 0627   07/11/18 0800  azithromycin (ZITHROMAX) 500 mg in sodium chloride 0.9 % 250 mL IVPB     500 mg 250 mL/hr over 60 Minutes Intravenous Every 24 hours 07/10/18 0853 07/18/18 0759   07/11/18 0630  cefTRIAXone (ROCEPHIN) 2 g in sodium chloride 0.9 % 100 mL IVPB     2 g 200 mL/hr over 30 Minutes Intravenous Daily 07/11/18 0628     07/10/18 0315  cefTRIAXone (ROCEPHIN) 2 g in sodium chloride 0.9 % 100 mL IVPB  Status:  Discontinued     2 g 200 mL/hr over 30 Minutes Intravenous Every 24 hours 07/10/18 0302 07/10/18 0908   07/10/18 0315  azithromycin (ZITHROMAX) 500 mg in sodium chloride 0.9 % 250 mL IVPB  Status:  Discontinued  500 mg 250 mL/hr over 60 Minutes Intravenous Every 24 hours 07/10/18 0302 07/10/18 5732      Subjective: Patient seen and examined the bedside this morning.  Comfortable.  Hemodynamically stable.  Respiratory status has improved.  Afebrile.  Denies any complaints.  Objective: Vitals:   07/11/18 0749 07/11/18 1232 07/11/18 1252 07/11/18 1341  BP:  136/64 (!) 154/89   Pulse:  76 85   Resp:  19 18   Temp:  97.7 F (36.5 C)  97.8 F (36.6 C)   TempSrc:  Oral    SpO2: 100% 100% 95% 98%  Weight:      Height:        Intake/Output Summary (Last 24 hours) at 07/11/2018 1346 Last data filed at 07/11/2018 0912 Gross per 24 hour  Intake 1007.42 ml  Output 1850 ml  Net -842.58 ml   Filed Weights   07/10/18 0238  Weight: 63.5 kg    Examination:  General exam: Appears calm and comfortable ,Not in distress, pleasant elderly female  HEENT:PERRL,Oral mucosa moist, Ear/Nose normal on gross exam Respiratory system: Bilateral basal crackles Cardiovascular system: S1 & S2 heard, RRR. No JVD, murmurs, rubs, gallops or clicks. No pedal edema. Gastrointestinal system: Abdomen is nondistended, soft and nontender. No organomegaly or masses felt. Normal bowel sounds heard. Central nervous system: Alert and oriented. No focal neurological deficits. Extremities: No edema, no clubbing ,no cyanosis, distal peripheral pulses palpable. Skin: No rashes, lesions or ulcers,no icterus ,no pallor MSK: Normal muscle bulk,tone ,power Psychiatry: Judgement and insight appear normal. Mood & affect appropriate.     Data Reviewed: I have personally reviewed following labs and imaging studies  CBC: Recent Labs  Lab 07/10/18 0316 07/11/18 0419  WBC 14.3* 12.2*  NEUTROABS 12.9*  --   HGB 11.0* 10.3*  HCT 33.2* 31.7*  MCV 88.5 90.1  PLT 164 202   Basic Metabolic Panel: Recent Labs  Lab 07/10/18 0316 07/10/18 2220 07/11/18 1022  NA 138 137 137  K 3.9 4.1 4.0  CL 108 107 108  CO2 20* 20* 18*  GLUCOSE 243* 227* 401*  BUN 24* 27* 30*  CREATININE 1.40* 1.51* 1.28*  CALCIUM 9.2 9.0 9.1   GFR: Estimated Creatinine Clearance: 34.6 mL/min (A) (by C-G formula based on SCr of 1.28 mg/dL (H)). Liver Function Tests: Recent Labs  Lab 07/10/18 0316 07/10/18 2220  AST 17 17  ALT 9 12  ALKPHOS 127* 106  BILITOT 1.4* 1.0  PROT 7.6 7.0  ALBUMIN 3.8 3.4*   No results for input(s): LIPASE, AMYLASE in the last 168 hours. No  results for input(s): AMMONIA in the last 168 hours. Coagulation Profile: No results for input(s): INR, PROTIME in the last 168 hours. Cardiac Enzymes: Recent Labs  Lab 07/10/18 1904  CKTOTAL 117  TROPONINI 0.14*   BNP (last 3 results) No results for input(s): PROBNP in the last 8760 hours. HbA1C: Recent Labs    07/10/18 1014  HGBA1C 6.9*   CBG: Recent Labs  Lab 07/10/18 1157 07/10/18 1649 07/10/18 2144 07/11/18 0738 07/11/18 1131  GLUCAP 194* 120* 201* 315* 375*   Lipid Profile: No results for input(s): CHOL, HDL, LDLCALC, TRIG, CHOLHDL, LDLDIRECT in the last 72 hours. Thyroid Function Tests: Recent Labs    07/10/18 1014  TSH 1.176   Anemia Panel: Recent Labs    07/10/18 1904  FERRITIN 78   Sepsis Labs: Recent Labs  Lab 07/10/18 0316 07/10/18 0455 07/10/18 1014 07/10/18 1904  PROCALCITON  --   --  21.41  --   LATICACIDVEN 1.6 1.3  --  1.2    Recent Results (from the past 240 hour(s))  Blood Culture (routine x 2)     Status: None (Preliminary result)   Collection Time: 07/10/18  3:16 AM  Result Value Ref Range Status   Specimen Description   Final    BLOOD LEFT ANTECUBITAL Performed at [facility] [address], [place], [place] [address]    Special Requests   Final    BOTTLES DRAWN AEROBIC AND ANAEROBIC Blood Culture adequate volume Performed at [facility] [address], [place], [place] [address]    Culture  Setup Time   Final    GRAM NEGATIVE RODS IN BOTH AEROBIC AND ANAEROBIC BOTTLES CRITICAL RESULT CALLED TO, READ BACK BY AND VERIFIED WITH: Audrea Muscat [id number] 07/11/2018 Mena Goes Performed at [facility], [place] [address], [place], [place] [address]    Culture GRAM NEGATIVE RODS  Final   Report Status PENDING  Incomplete  Blood Culture ID Panel (Reflexed)     Status: Abnormal   Collection Time: 07/10/18  3:16 AM  Result Value Ref Range Status   Enterococcus species NOT DETECTED NOT  DETECTED Final   Listeria monocytogenes NOT DETECTED NOT DETECTED Final   Staphylococcus species NOT DETECTED NOT DETECTED Final   Staphylococcus aureus (BCID) NOT DETECTED NOT DETECTED Final   Streptococcus species NOT DETECTED NOT DETECTED Final   Streptococcus agalactiae NOT DETECTED NOT DETECTED Final   Streptococcus pneumoniae NOT DETECTED NOT DETECTED Final   Streptococcus pyogenes NOT DETECTED NOT DETECTED Final   Acinetobacter baumannii NOT DETECTED NOT DETECTED Final   Enterobacteriaceae species DETECTED (A) NOT DETECTED Final    Comment: Enterobacteriaceae represent a large family of gram-negative bacteria, not a single organism. CRITICAL RESULT CALLED TO, READ BACK BY AND VERIFIED WITH: J. GRIMSLEY,PHARMD [id number] 07/11/2018 T. TYSOR    Enterobacter cloacae complex NOT DETECTED NOT DETECTED Final   Escherichia coli DETECTED (A) NOT DETECTED Final    Comment: CRITICAL RESULT CALLED TO, READ BACK BY AND VERIFIED WITH: J. Fabiola Backer [phone] 07/11/2018 T. TYSOR    Klebsiella oxytoca NOT DETECTED NOT DETECTED Final   Klebsiella pneumoniae NOT DETECTED NOT DETECTED Final   Proteus species NOT DETECTED NOT DETECTED Final   Serratia marcescens NOT DETECTED NOT DETECTED Final   Carbapenem resistance NOT DETECTED NOT DETECTED Final   Haemophilus influenzae NOT DETECTED NOT DETECTED Final   Neisseria meningitidis NOT DETECTED NOT DETECTED Final   Pseudomonas aeruginosa NOT DETECTED NOT DETECTED Final   Candida albicans NOT DETECTED NOT DETECTED Final   Candida glabrata NOT DETECTED NOT DETECTED Final   Candida krusei NOT DETECTED NOT DETECTED Final   Candida parapsilosis NOT DETECTED NOT DETECTED Final   Candida tropicalis NOT DETECTED NOT DETECTED Final    Comment: Performed at [facility], [place]. [address], [place], [place] [address]  SARS Coronavirus 2 (CEPHEID- Performed in [facility] hospital lab), Hosp Order     Status: None   Collection Time: 07/10/18  3:17 AM  Result  Value Ref Range Status   SARS Coronavirus 2 NEGATIVE NEGATIVE Final    Comment: (NOTE) If result is NEGATIVE SARS-CoV-2 target nucleic acids are NOT DETECTED. The SARS-CoV-2 RNA is generally detectable in upper and lower  respiratory specimens during the acute phase of infection. The lowest  concentration of SARS-CoV-2 viral copies this assay can detect is 250  copies / mL. A negative result does  not preclude SARS-CoV-2 infection  and should not be used as the sole basis for treatment or other  patient management decisions.  A negative result may occur with  improper specimen collection / handling, submission of specimen other  than nasopharyngeal swab, presence of viral mutation(s) within the  areas targeted by this assay, and inadequate number of viral copies  (<250 copies / mL). A negative result must be combined with clinical  observations, patient history, and epidemiological information. If result is POSITIVE SARS-CoV-2 target nucleic acids are DETECTED. The SARS-CoV-2 RNA is generally detectable in upper and lower  respiratory specimens dur ing the acute phase of infection.  Positive  results are indicative of active infection with SARS-CoV-2.  Clinical  correlation with patient history and other diagnostic information is  necessary to determine patient infection status.  Positive results do  not rule out bacterial infection or co-infection with other viruses. If result is PRESUMPTIVE POSTIVE SARS-CoV-2 nucleic acids MAY BE PRESENT.   A presumptive positive result was obtained on the submitted specimen  and confirmed on repeat testing.  While 2019 novel coronavirus  (SARS-CoV-2) nucleic acids may be present in the submitted sample  additional confirmatory testing may be necessary for epidemiological  and / or clinical management purposes  to differentiate between  SARS-CoV-2 and other Sarbecovirus currently known to infect humans.  If clinically indicated additional testing  with an alternate test  methodology [phone]) is advised. The SARS-CoV-2 RNA is generally  detectable in upper and lower respiratory sp ecimens during the acute  phase of infection. The expected result is Negative. Fact Sheet for Patients:  StrictlyIdeas.no Fact Sheet for Healthcare Providers: BankingDealers.co.za This test is not yet approved or cleared by the [place] FDA and has been authorized for detection and/or diagnosis of SARS-CoV-2 by FDA under an Emergency Use Authorization (EUA).  This EUA will remain in effect (meaning this test can be used) for the duration of the COVID-19 declaration under Section 564(b)(1) of the Act, 21 U.S.C. section 360bbb-3(b)(1), unless the authorization is terminated or revoked sooner. Performed at [facility], [place] [address], [place], [place] [address]   Urine culture     Status: Abnormal (Preliminary result)   Collection Time: 07/10/18  5:52 AM  Result Value Ref Range Status   Specimen Description   Final    URINE, CATHETERIZED Performed at [facility], [place] [address] [place], [place] [address]    Special Requests   Final    NONE Performed at [facility], [place] [address] [place], [place] [address]    Culture 70,000 COLONIES/mL ESCHERICHIA COLI (A)  Final   Report Status PENDING  Incomplete  Culture, blood (routine x 2) Call MD if unable to obtain prior to antibiotics being given     Status: None (Preliminary result)   Collection Time: 07/10/18 10:14 AM  Result Value Ref Range Status   Specimen Description   Final    RIGHT ANTECUBITAL Performed at [facility] [address], [place], [place] [address]    Special Requests   Final    BOTTLES DRAWN AEROBIC ONLY Blood Culture results may not be optimal due to an inadequate volume of blood received in culture bottles Performed at [facility] [address], [place], [place] [address]    Culture   Final    NO GROWTH < 24 HOURS Performed at [facility] [address] [place], [place] [address]  Report Status PENDING  Incomplete  Culture, blood (routine x 2) Call MD if unable to obtain prior to antibiotics being given     Status: None (Preliminary result)   Collection Time: 07/10/18 10:14 AM  Result Value Ref Range Status   Specimen Description   Final    BLOOD LEFT ARM Performed at [facility], [address], [place], [place] [address]    Special Requests   Final    BOTTLES DRAWN AEROBIC ONLY Blood Culture results may not be optimal due to an inadequate volume of blood received in culture bottles Performed at [facility] [address], [place], [place] [address]    Culture   Final    NO GROWTH < 24 HOURS Performed at [facility] [address] [place], [place] [address]    Report Status PENDING  Incomplete  SARS Coronavirus 2 (CEPHEID- Performed in [facility] hospital lab), Hosp Order     Status: None   Collection Time: 07/10/18  6:22 PM  Result Value Ref Range Status   SARS Coronavirus 2 NEGATIVE NEGATIVE Final    Comment: (NOTE) If result is NEGATIVE SARS-CoV-2 target nucleic acids are NOT DETECTED. The SARS-CoV-2 RNA is generally detectable in upper and lower  respiratory specimens during the acute phase of infection. The lowest  concentration of SARS-CoV-2 viral copies this assay can detect is 250  copies / mL. A negative result does not preclude SARS-CoV-2 infection  and should not be used as the sole basis for treatment or other  patient management decisions.  A negative result may occur with  improper specimen collection / handling, submission of specimen other  than nasopharyngeal swab, presence of viral mutation(s) within the  areas targeted by this assay, and inadequate number of viral copies  (<250 copies / mL). A negative result must be combined with  clinical  observations, patient history, and epidemiological information. If result is POSITIVE SARS-CoV-2 target nucleic acids are DETECTED. The SARS-CoV-2 RNA is generally detectable in upper and lower  respiratory specimens dur ing the acute phase of infection.  Positive  results are indicative of active infection with SARS-CoV-2.  Clinical  correlation with patient history and other diagnostic information is  necessary to determine patient infection status.  Positive results do  not rule out bacterial infection or co-infection with other viruses. If result is PRESUMPTIVE POSTIVE SARS-CoV-2 nucleic acids MAY BE PRESENT.   A presumptive positive result was obtained on the submitted specimen  and confirmed on repeat testing.  While 2019 novel coronavirus  (SARS-CoV-2) nucleic acids may be present in the submitted sample  additional confirmatory testing may be necessary for epidemiological  and / or clinical management purposes  to differentiate between  SARS-CoV-2 and other Sarbecovirus currently known to infect humans.  If clinically indicated additional testing with an alternate test  methodology [phone]) is advised. The SARS-CoV-2 RNA is generally  detectable in upper and lower respiratory sp ecimens during the acute  phase of infection. The expected result is Negative. Fact Sheet for Patients:  StrictlyIdeas.no Fact Sheet for Healthcare Providers: BankingDealers.co.za This test is not yet approved or cleared by the [place] FDA and has been authorized for detection and/or diagnosis of SARS-CoV-2 by FDA under an Emergency Use Authorization (EUA).  This EUA will remain in effect (meaning this test can be used) for the duration of the COVID-19 declaration under Section 564(b)(1) of the Act, 21 U.S.C. section 360bbb-3(b)(1), unless the authorization is terminated  or revoked sooner. Performed at [facility],  [place] [address] [place], [place] [address]   Respiratory Panel by PCR     Status: None   Collection Time: 07/10/18  6:26 PM  Result Value Ref Range Status   Adenovirus NOT DETECTED NOT DETECTED Final   Coronavirus 229E NOT DETECTED NOT DETECTED Final    Comment: (NOTE) The Coronavirus on the Respiratory Panel, DOES NOT test for the novel  Coronavirus (2019 nCoV)    Coronavirus HKU1 NOT DETECTED NOT DETECTED Final   Coronavirus NL63 NOT DETECTED NOT DETECTED Final   Coronavirus OC43 NOT DETECTED NOT DETECTED Final   Metapneumovirus NOT DETECTED NOT DETECTED Final   Rhinovirus / Enterovirus NOT DETECTED NOT DETECTED Final   Influenza A NOT DETECTED NOT DETECTED Final   Influenza B NOT DETECTED NOT DETECTED Final   Parainfluenza Virus 1 NOT DETECTED NOT DETECTED Final   Parainfluenza Virus 2 NOT DETECTED NOT DETECTED Final   Parainfluenza Virus 3 NOT DETECTED NOT DETECTED Final   Parainfluenza Virus 4 NOT DETECTED NOT DETECTED Final   Respiratory Syncytial Virus NOT DETECTED NOT DETECTED Final   Bordetella pertussis NOT DETECTED NOT DETECTED Final   Chlamydophila pneumoniae NOT DETECTED NOT DETECTED Final   Mycoplasma pneumoniae NOT DETECTED NOT DETECTED Final    Comment: Performed at [facility], [place]. [address] [place], [place] [address]         Radiology Studies: Dg Chest Port 1 View  Result Date: 07/10/2018 CLINICAL DATA:  Dyspnea EXAM: PORTABLE CHEST 1 VIEW COMPARISON:  COPD.  Fever and chills FINDINGS: Cardiomegaly. Right hilar enlargement that is vascular based on CT. There is likely pulmonary hypertension. Asymmetric left base opacity that is least partially chronic as there is fibrotic interstitial densities on a January 2020 CT. There is also emphysema. Remote left rib fractures. IMPRESSION: 1. Equivocal for left lower lobe pneumonia. 2. Cardiomegaly with pulmonary artery enlargement and interstitial lung disease. Electronically Signed   By: Monte Fantasia M.D.    On: 07/10/2018 18:59   Dg Chest Port 1 View  Result Date: 07/10/2018 CLINICAL DATA:  Shortness of breath EXAM: PORTABLE CHEST 1 VIEW COMPARISON:  04/06/2018 chest radiograph and chest CT 02/02/2018 FINDINGS: Mild cardiomegaly with mild interstitial pulmonary edema. Focal opacity at the right hilum, likely enlarged pulmonary artery. Atelectasis at the left lung base. IMPRESSION: Mild cardiomegaly and mild interstitial pulmonary edema. Electronically Signed   By: Ulyses Jarred M.D.   On: 07/10/2018 05:27        Scheduled Meds: . atorvastatin  40 mg Oral q1800  . carvedilol  6.25 mg Oral BID WC  . cholecalciferol  400 Units Oral Daily  . enoxaparin (LOVENOX) injection  40 mg Subcutaneous Q24H  . insulin aspart  0-5 Units Subcutaneous QHS  . insulin aspart  0-9 Units Subcutaneous TID WC  . ipratropium-albuterol  3 mL Nebulization Q6H  . levothyroxine  88 mcg Oral QAC breakfast  . methylPREDNISolone (SOLU-MEDROL) injection  40 mg Intravenous Q12H  . pantoprazole  40 mg Oral Daily  . Ensure Max Protein  11 oz Oral BID BM   Continuous Infusions: . azithromycin 250 mL/hr at 07/11/18 0912  . cefTRIAXone (ROCEPHIN)  IV Stopped (07/11/18 0751)     LOS: 1 day    Time spent: 35 mins.More than 50% of that time was spent in counseling and/or coordination of care.      Shelly Coss, MD [facility] Pager [phone]  If 7PM-7AM, please contact  night-coverage [email or web address] Password [email or web address] 07/11/2018, 1:46 PM

## 2018-07-11 NOTE — Progress Notes (Signed)
Initial Nutrition Assessment  RD working remotely.  DOCUMENTATION CODES:   Not applicable  INTERVENTION:  Will discontinue Ensure Enlive.  Provide Ensure Max Protein po BID, each supplement provides 150 kcal and 30 grams of protein.  Recommend obtaining true measured weight.  NUTRITION DIAGNOSIS:   Increased nutrient needs related to catabolic illness(COPD, CHF) as evidenced by estimated needs.  GOAL:   Patient will meet greater than or equal to 90% of their needs  MONITOR:   PO intake, Supplement acceptance, Labs, Weight trends, I & O's  REASON FOR ASSESSMENT:   Malnutrition Screening Tool    ASSESSMENT:   83 year old female with PMHx of COPD, GERD, HTN, bronchiectasis, idiopathic cardiomyopathy, diverticulosis, hx CVA 2009, hypothyroidism, hx MI 1999, OA, DM type 2 who is now admitted with sepsis likely due to community-acquired PNA, UTI, AKI, acute on chronic respiratory failure with hypoxia.   Spoke with patient over the phone to obtain nutrition/weight history. She reports her appetite was decreased PTA due to generally not feeling well. She is unsure how long it was decreased. Her daughter prepares her meals. She reports some days she would eat well and others she would not eat much at all. She reports her appetite is good now and she is eating well. She reports eating 100% of dinner last night and breakfast this morning. She just received her lunch tray. She used to drink Ensure but reports it made her blood sugar go up too high. Elevated CBGs likely also related to Solu-Medrol. She is amenable to trying Ensure Max Protein. Skin is intact.  Patient's UBW was around 215 lbs (97.7 kg). She reports she has been losing weight ever since she was started on vitamin D supplementation, but is unsure how long ago that was. She reports her clothes are very baggy on her now even though she is unsure how much she has lost. Per chart she was 95.4 kg on 11/29/2017. She is currently 63.5  kg (140 lbs). If current weight is accurate patient has lost 31.9 kg (33% body weight) over the past 8 months, which is significant for time frame and very concerning.  Medications reviewed and include: cholecalciferol 400 units daily, Ensure Enlive BID, Novolog 0-9 units TID, Novolog 0-5 units QHS, levothyroxine, Solu-Medrol 40 mg Q12hrs IV, pantoprazole, azithromycin, ceftriaxone.  Labs reviewed: CBG 315-375, CO2 18, BUN 30. HgbA1c 6.9 on 6/7.  Patient is at risk for malnutrition. Unable to determine if she meets criteria without more thorough nutrition/intake history or NFPE.  NUTRITION - FOCUSED PHYSICAL EXAM:  Unable to complete at this time.  Diet Order:   Diet Order            Diet Carb Modified Fluid consistency: Thin; Room service appropriate? Yes  Diet effective now             EDUCATION NEEDS:   No education needs have been identified at this time  Skin:  Skin Assessment: Reviewed RN Assessment  Last BM:  07/10/2018 per chart  Height:   Ht Readings from Last 1 Encounters:  07/10/18 5\' 8"  (1.727 m)   Weight:   Wt Readings from Last 1 Encounters:  07/10/18 63.5 kg   Ideal Body Weight:  63.6 kg  BMI:  Body mass index is 21.29 kg/m.  Estimated Nutritional Needs:   Kcal:  1600-1800  Protein:  75-85 grams  Fluid:  per MD  Willey Blade, MS, RD, LDN Office: [phone] Pager: [phone] After Hours/Weekend Pager: [phone]

## 2018-07-11 NOTE — Evaluation (Signed)
Physical Therapy Evaluation Patient Details Name: Caitlyn Aguirre MRN: [id number] DOB: January 19, 1936 Today's Date: 07/11/2018   History of Present Illness  83 year old female with history of COPD, on home oxygen at 2 L/min as needed, diabetes mellitus, hypertension, hyperlipidemia, hypothyroidism who presented to the emergency room with fever of 103.4 F, chills, shortness of breath for last 2 days.Patient was found to be febrile on presentation.  Chest x-ray showed possible left lower lobe pneumonia.    Pt admitted for sepsis with UTI and possible PNA  Clinical Impression  Pt admitted with above diagnosis. Pt currently with functional limitations due to the deficits listed below (see PT Problem List).  Pt will benefit from skilled PT to increase their independence and safety with mobility to allow discharge to the venue listed below.  Pt requesting to use bathroom and then wash up.  Pt able to stand at sink with set up with quick bathing (min/guard for safety and BSC behind pt to sit down if rest break needed).  Pt then ambulated back to chair and SpO2 84% on room air so 2L O2 [place] reapplied.  Pt reports 2L O2 at home as needed.  Pt also reports she lives with her daughter and has family assist if needed.     Follow Up Recommendations Home health PT;Supervision for mobility/OOB    Equipment Recommendations  Rolling walker with 5" wheels    Recommendations for Other Services       Precautions / Restrictions Precautions Precautions: Fall      Mobility  Bed Mobility Overal bed mobility: Needs Assistance Bed Mobility: Supine to Sit     Supine to sit: Supervision        Transfers Overall transfer level: Needs assistance Equipment used: None Transfers: Sit to/from Stand Sit to Stand: Min guard         General transfer comment: min/guard for safety, pt unsteady however self corrected, uses UEs to assist  Ambulation/Gait Ambulation/Gait assistance: Min guard Gait Distance (Feet): 20  Feet Assistive device: Rolling walker (2 wheeled) Gait Pattern/deviations: Step-through pattern;Decreased stride length     General Gait Details: pt ambulated within room with RW, unsteady however self corrects with RW  Stairs            Wheelchair Mobility    Modified Rankin (Stroke Patients Only)       Balance Overall balance assessment: Needs assistance         Standing balance support: No upper extremity supported Standing balance-Leahy Scale: Fair Standing balance comment: static fair                             Pertinent Vitals/Pain Pain Assessment: No/denies pain    Home Living Family/patient expects to be discharged to:: Private residence Living Arrangements: Children(daughter - RN) Available Help at Discharge: Family;Available 24 hours/day Type of Home: House Home Access: Stairs to enter Entrance Stairs-Rails: Psychiatric nurse of Steps: " a few" Home Layout: One level Home Equipment: None      Prior Function Level of Independence: Independent               Hand Dominance        Extremity/Trunk Assessment        Lower Extremity Assessment Lower Extremity Assessment: Generalized weakness    Cervical / Trunk Assessment Cervical / Trunk Assessment: Normal  Communication   Communication: No difficulties  Cognition Arousal/Alertness: Awake/alert Behavior During Therapy: WFL for tasks assessed/performed  Overall Cognitive Status: Within Functional Limits for tasks assessed                                        General Comments      Exercises     Assessment/Plan    PT Assessment Patient needs continued PT services  PT Problem List Decreased balance;Decreased strength;Decreased activity tolerance;Decreased mobility;Cardiopulmonary status limiting activity;Decreased knowledge of use of DME       PT Treatment Interventions DME instruction;Functional mobility training;Balance  training;Patient/family education;Gait training;Therapeutic activities;Neuromuscular re-education;Stair training;Therapeutic exercise    PT Goals (Current goals can be found in the Care Plan section)  Acute Rehab PT Goals PT Goal Formulation: With patient Time For Goal Achievement: 07/25/18 Potential to Achieve Goals: Good    Frequency Min 3X/week   Barriers to discharge        Co-evaluation               AM-PAC PT "6 Clicks" Mobility  Outcome Measure Help needed turning from your back to your side while in a flat bed without using bedrails?: None Help needed moving from lying on your back to sitting on the side of a flat bed without using bedrails?: A Little Help needed moving to and from a bed to a chair (including a wheelchair)?: A Little Help needed standing up from a chair using your arms (e.g., wheelchair or bedside chair)?: A Little Help needed to walk in hospital room?: A Little Help needed climbing 3-5 steps with a railing? : A Little 6 Click Score: 19    End of Session Equipment Utilized During Treatment: Oxygen Activity Tolerance: Patient tolerated treatment well Patient left: in chair;with call bell/phone within reach;with chair alarm set Nurse Communication: Mobility status PT Visit Diagnosis: Unsteadiness on feet (R26.81)    Time: 7793-9030 PT Time Calculation (min) (ACUTE ONLY): 28 min   Charges:   PT Evaluation $PT Eval Low Complexity: [place], PT, DPT Acute Rehabilitation Services Office: [phone] Pager: [phone]  Trena Platt 07/11/2018, 3:05 PM

## 2018-07-11 NOTE — Plan of Care (Signed)

## 2018-07-11 NOTE — Progress Notes (Signed)
Requested infection precaution orders to be updated for Pt with second test for COVID -19 results being negative. Awaiting response from NP

## 2018-07-12 LAB — CBC WITH DIFFERENTIAL/PLATELET
Abs Immature Granulocytes: 0.21 10*3/uL — ABNORMAL HIGH (ref 0.00–0.07)
Basophils Absolute: 0 10*3/uL (ref 0.0–0.1)
Basophils Relative: 0 %
Eosinophils Absolute: 0 10*3/uL (ref 0.0–0.5)
Eosinophils Relative: 0 %
HCT: 30.1 % — ABNORMAL LOW (ref 36.0–46.0)
Hemoglobin: 9.9 g/dL — ABNORMAL LOW (ref 12.0–15.0)
Immature Granulocytes: 1 %
Lymphocytes Relative: 4 %
Lymphs Abs: 0.7 10*3/uL (ref 0.7–4.0)
MCH: 28.9 pg (ref 26.0–34.0)
MCHC: 32.9 g/dL (ref 30.0–36.0)
MCV: 87.8 fL (ref 80.0–100.0)
Monocytes Absolute: 1.3 10*3/uL — ABNORMAL HIGH (ref 0.1–1.0)
Monocytes Relative: 8 %
Neutro Abs: 15 10*3/uL — ABNORMAL HIGH (ref 1.7–7.7)
Neutrophils Relative %: 87 %
Platelets: 191 10*3/uL (ref 150–400)
RBC: 3.43 MIL/uL — ABNORMAL LOW (ref 3.87–5.11)
RDW: 15.1 % (ref 11.5–15.5)
WBC: 17.3 10*3/uL — ABNORMAL HIGH (ref 4.0–10.5)
nRBC: 0 % (ref 0.0–0.2)

## 2018-07-12 LAB — BASIC METABOLIC PANEL
Anion gap: 8 (ref 5–15)
Anion gap: 8 (ref 5–15)
BUN: 41 mg/dL — ABNORMAL HIGH (ref 8–23)
BUN: 47 mg/dL — ABNORMAL HIGH (ref 8–23)
CO2: 23 mmol/L (ref 22–32)
CO2: 24 mmol/L (ref 22–32)
Calcium: 9.6 mg/dL (ref 8.9–10.3)
Calcium: 10 mg/dL (ref 8.9–10.3)
Chloride: 105 mmol/L (ref 98–111)
Chloride: 105 mmol/L (ref 98–111)
Creatinine, Ser: 1.21 mg/dL — ABNORMAL HIGH (ref 0.44–1.00)
Creatinine, Ser: 1.33 mg/dL — ABNORMAL HIGH (ref 0.44–1.00)
GFR calc Af Amer: 49 mL/min — ABNORMAL LOW (ref >60)
GFR calc Af Amer: 43 mL/min — ABNORMAL LOW (ref >60)
GFR calc non Af Amer: 42 mL/min — ABNORMAL LOW (ref >60)
GFR calc non Af Amer: 37 mL/min — ABNORMAL LOW (ref >60)
Glucose, Bld: 226 mg/dL — ABNORMAL HIGH (ref 70–99)
Glucose, Bld: 203 mg/dL — ABNORMAL HIGH (ref 70–99)
Potassium: 4.2 mmol/L (ref 3.5–5.1)
Potassium: 4.1 mmol/L (ref 3.5–5.1)
Sodium: 136 mmol/L (ref 135–145)
Sodium: 137 mmol/L (ref 135–145)

## 2018-07-12 LAB — GLUCOSE, CAPILLARY
Glucose-Capillary: 173 mg/dL — ABNORMAL HIGH (ref 70–99)
Glucose-Capillary: 179 mg/dL — ABNORMAL HIGH (ref 70–99)
Glucose-Capillary: 189 mg/dL — ABNORMAL HIGH (ref 70–99)
Glucose-Capillary: 280 mg/dL — ABNORMAL HIGH (ref 70–99)

## 2018-07-12 MED ORDER — FUROSEMIDE 10 MG/ML IJ SOLN
40.0000 mg | Freq: Once | INTRAMUSCULAR | Status: AC
  Administered 2018-07-12: 40 mg via INTRAVENOUS
  Filled 2018-07-12: qty 4

## 2018-07-12 MED ORDER — PREDNISONE 20 MG PO TABS
40.0000 mg | ORAL_TABLET | Freq: Every day | ORAL | Status: DC
  Administered 2018-07-12 – 2018-07-13 (×2): 40 mg via ORAL
  Filled 2018-07-12 (×2): qty 2

## 2018-07-12 NOTE — Plan of Care (Signed)

## 2018-07-12 NOTE — Progress Notes (Addendum)
PROGRESS NOTE    Caitlyn Aguirre  IHK:[id number] DOB: [birth year] DOA: 07/10/2018 PCP: Axel Filler, MD   Brief Narrative: Patient is a 83 year old female with history of COPD, on home oxygen at 2 L/min as needed, diabetes mellitus, hypertension, hyperlipidemia, hypothyroidism who presented to the emergency room with fever of 103.4 F, chills, shortness of breath for last 2 days.Patient was found to be febrile on presentation.  Chest x-ray showed possible left lower lobe pneumonia.  Urinalysis was suggestive of urinary tract infection.  Blood cultures have been growing E. coli now.  Currently on IV antibiotics.  Assessment & Plan:   Principal Problem:   Sepsis ([place]) Active Problems:   Hypothyroidism   Type 2 diabetes mellitus with other specified complication (HCC)   Essential hypertension   COPD ([place])   GERD (gastroesophageal reflux disease)   Acute lower UTI   CAP (community acquired pneumonia)   AKI (acute kidney injury) ([place])  E. coli bacteremia: Awaiting final culture report.  Most likely source is urine.  Presented with fever.  Continue ceftriaxone.  Urinary tract infection: Most likely source for bacteremia.  Urine culture showed 70,000 colonies of E. coli.  Continue ceftriaxone.  Suspected pneumonia: Chest x-ray showed possible left lower lobe pneumonia.  Continue ceftriaxone with azithromycin.  Currently on 2 L of oxygen per minute which is her baseline. Auscultation revealed bibasilar crackles.  History of COPD: Past smoker.  On 2 L of oxygen per minute at home as needed.  .  Continue bronchodilators as needed.CXR showed cardiomegaly with pulmonary artery enlargement and interstitial lung disease.  Auscultation revealed mild wheezes today.  Started on prednisone  Acute kidney injury: Baseline creatinine normal.  Presented with acute kidney injury.  Kidney function improving.  Suspected volume overload.   We will give her another dose of 40 mg of Lasix  today.  Hypothyroidism: Continue Synthyroid.  Diabetes mellitus type 2: Hemoglobin A1c of 6.9.  Hyperglycemic this morning.  Continue sliding scale and Lantus.  On metformin at home.  Hypertension: Blood pressure soft on presentation.  Antihypertensives held.  Leukocytosis: Most likely associated with the steroids.  Continue to monitor  Debility/deconditioning: Home health recommended by physical therapy     Nutrition Problem: Increased nutrient needs Etiology: catabolic illness(COPD, CHF)      DVT prophylaxis:Lovenox Code Status: Full Family Communication: Discussed with daughter on phone 07/11/2018 Disposition Plan: Home tomorrow   Consultants: None  Procedures: None  Antimicrobials:  Anti-infectives (From admission, onward)   Start     Dose/Rate Route Frequency Ordered Stop   07/11/18 1000  cefTRIAXone (ROCEPHIN) 1 g in sodium chloride 0.9 % 100 mL IVPB  Status:  Discontinued     1 g 200 mL/hr over 30 Minutes Intravenous Every 24 hours 07/10/18 0853 07/11/18 0627   07/11/18 0800  azithromycin (ZITHROMAX) 500 mg in sodium chloride 0.9 % 250 mL IVPB     500 mg 250 mL/hr over 60 Minutes Intravenous Every 24 hours 07/10/18 0853 07/18/18 0759   07/11/18 0630  cefTRIAXone (ROCEPHIN) 2 g in sodium chloride 0.9 % 100 mL IVPB     2 g 200 mL/hr over 30 Minutes Intravenous Daily 07/11/18 0628     07/10/18 0315  cefTRIAXone (ROCEPHIN) 2 g in sodium chloride 0.9 % 100 mL IVPB  Status:  Discontinued     2 g 200 mL/hr over 30 Minutes Intravenous Every 24 hours 07/10/18 0302 07/10/18 0908   07/10/18 0315  azithromycin (ZITHROMAX) 500 mg in sodium chloride 0.9 %  250 mL IVPB  Status:  Discontinued     500 mg 250 mL/hr over 60 Minutes Intravenous Every 24 hours 07/10/18 0302 07/10/18 1324      Subjective: Patient seen and examined the bedside this morning.  He is stable hemodynamically.  Denies any complaints.  Currently on 2 L of oxygen per minute which is her baseline.  Feels  better.  Slept well  Objective: Vitals:   07/11/18 2157 07/12/18 0500 07/12/18 0534 07/12/18 0745  BP: 134/64  135/77   Pulse: 77  66   Resp:   18   Temp: 98.1 F (36.7 C)  98.2 F (36.8 C)   TempSrc: Oral  Oral   SpO2: 95%  99% 95%  Weight:  94.8 kg    Height:        Intake/Output Summary (Last 24 hours) at 07/12/2018 1220 Last data filed at 07/12/2018 0920 Gross per 24 hour  Intake 310.01 ml  Output -  Net 310.01 ml   Filed Weights   07/10/18 0238 07/12/18 0500  Weight: 63.5 kg 94.8 kg    Examination: General exam: Appears calm and comfortable ,Not in distress,average built,elderly female HEENT:PERRL,Oral mucosa moist, Ear/Nose normal on gross exam Respiratory system: Bilateral fine crackles on the bases Cardiovascular system: S1 & S2 heard, RRR. No JVD, murmurs, rubs, gallops or clicks. Gastrointestinal system: Abdomen is nondistended, soft and nontender. No organomegaly or masses felt. Normal bowel sounds heard. Central nervous system: Alert and oriented. No focal neurological deficits. Extremities: No edema, no clubbing ,no cyanosis, distal peripheral pulses palpable. Skin: No rashes, lesions or ulcers,no icterus ,no pallor    Data Reviewed: I have personally reviewed following labs and imaging studies  CBC: Recent Labs  Lab 07/10/18 0316 07/11/18 0419 07/12/18 0316  WBC 14.3* 12.2* 17.3*  NEUTROABS 12.9*  --  15.0*  HGB 11.0* 10.3* 9.9*  HCT 33.2* 31.7* 30.1*  MCV 88.5 90.1 87.8  PLT 164 151 401   Basic Metabolic Panel: Recent Labs  Lab 07/10/18 0316 07/10/18 2220 07/11/18 1022 07/12/18 0316  NA 138 137 137 136  K 3.9 4.1 4.0 4.2  CL 108 107 108 105  CO2 20* 20* 18* 23  GLUCOSE 243* 227* 401* 226*  BUN 24* 27* 30* 41*  CREATININE 1.40* 1.51* 1.28* 1.21*  CALCIUM 9.2 9.0 9.1 9.6   GFR: Estimated Creatinine Clearance: 43.9 mL/min (A) (by C-G formula based on SCr of 1.21 mg/dL (H)). Liver Function Tests: Recent Labs  Lab 07/10/18 0316  07/10/18 2220  AST 17 17  ALT 9 12  ALKPHOS 127* 106  BILITOT 1.4* 1.0  PROT 7.6 7.0  ALBUMIN 3.8 3.4*   No results for input(s): LIPASE, AMYLASE in the last 168 hours. No results for input(s): AMMONIA in the last 168 hours. Coagulation Profile: No results for input(s): INR, PROTIME in the last 168 hours. Cardiac Enzymes: Recent Labs  Lab 07/10/18 1904  CKTOTAL 117  TROPONINI 0.14*   BNP (last 3 results) No results for input(s): PROBNP in the last 8760 hours. HbA1C: Recent Labs    07/10/18 1014  HGBA1C 6.9*   CBG: Recent Labs  Lab 07/11/18 1131 07/11/18 1641 07/11/18 2224 07/12/18 0742 07/12/18 1141  GLUCAP 375* 288* 255* 173* 179*   Lipid Profile: No results for input(s): CHOL, HDL, LDLCALC, TRIG, CHOLHDL, LDLDIRECT in the last 72 hours. Thyroid Function Tests: Recent Labs    07/10/18 1014  TSH 1.176   Anemia Panel: Recent Labs    07/10/18 1904  FERRITIN 78   Sepsis Labs: Recent Labs  Lab 07/10/18 0316 07/10/18 0455 07/10/18 1014 07/10/18 1904  PROCALCITON  --   --  21.41  --   LATICACIDVEN 1.6 1.3  --  1.2    Recent Results (from the past 240 hour(s))  Blood Culture (routine x 2)     Status: Abnormal (Preliminary result)   Collection Time: 07/10/18  3:16 AM  Result Value Ref Range Status   Specimen Description   Final    BLOOD LEFT ANTECUBITAL Performed at [facility] [address] [place], [place] [address]    Special Requests   Final    BOTTLES DRAWN AEROBIC AND ANAEROBIC Blood Culture adequate volume Performed at [facility] [address] [place], [place] [address]    Culture  Setup Time   Final    GRAM NEGATIVE RODS IN BOTH AEROBIC AND ANAEROBIC BOTTLES CRITICAL RESULT CALLED TO, READ BACK BY AND VERIFIED WITH: Audrea Muscat [id number] 07/11/2018 T. TYSOR    Culture (A)  Final    ESCHERICHIA COLI SUSCEPTIBILITIES TO FOLLOW Performed at [facility], [place] [address],  [place], [place] [address]    Report Status PENDING  Incomplete  Blood Culture ID Panel (Reflexed)     Status: Abnormal   Collection Time: 07/10/18  3:16 AM  Result Value Ref Range Status   Enterococcus species NOT DETECTED NOT DETECTED Final   Listeria monocytogenes NOT DETECTED NOT DETECTED Final   Staphylococcus species NOT DETECTED NOT DETECTED Final   Staphylococcus aureus (BCID) NOT DETECTED NOT DETECTED Final   Streptococcus species NOT DETECTED NOT DETECTED Final   Streptococcus agalactiae NOT DETECTED NOT DETECTED Final   Streptococcus pneumoniae NOT DETECTED NOT DETECTED Final   Streptococcus pyogenes NOT DETECTED NOT DETECTED Final   Acinetobacter baumannii NOT DETECTED NOT DETECTED Final   Enterobacteriaceae species DETECTED (A) NOT DETECTED Final    Comment: Enterobacteriaceae represent a large family of gram-negative bacteria, not a single organism. CRITICAL RESULT CALLED TO, READ BACK BY AND VERIFIED WITH: J. GRIMSLEY,PHARMD [id number] 07/11/2018 T. TYSOR    Enterobacter cloacae complex NOT DETECTED NOT DETECTED Final   Escherichia coli DETECTED (A) NOT DETECTED Final    Comment: CRITICAL RESULT CALLED TO, READ BACK BY AND VERIFIED WITH: J. Fabiola Backer [id number] 07/11/2018 T. TYSOR    Klebsiella oxytoca NOT DETECTED NOT DETECTED Final   Klebsiella pneumoniae NOT DETECTED NOT DETECTED Final   Proteus species NOT DETECTED NOT DETECTED Final   Serratia marcescens NOT DETECTED NOT DETECTED Final   Carbapenem resistance NOT DETECTED NOT DETECTED Final   Haemophilus influenzae NOT DETECTED NOT DETECTED Final   Neisseria meningitidis NOT DETECTED NOT DETECTED Final   Pseudomonas aeruginosa NOT DETECTED NOT DETECTED Final   Candida albicans NOT DETECTED NOT DETECTED Final   Candida glabrata NOT DETECTED NOT DETECTED Final   Candida krusei NOT DETECTED NOT DETECTED Final   Candida parapsilosis NOT DETECTED NOT DETECTED Final   Candida tropicalis NOT DETECTED NOT DETECTED Final     Comment: Performed at [facility], [place]. [address], [place], [place] [address]  SARS Coronavirus 2 (CEPHEID- Performed in [facility] hospital lab), Hosp Order     Status: None   Collection Time: 07/10/18  3:17 AM  Result Value Ref Range Status   SARS Coronavirus 2 NEGATIVE NEGATIVE Final    Comment: (NOTE) If result is NEGATIVE SARS-CoV-2 target nucleic acids are NOT DETECTED. The SARS-CoV-2 RNA is generally detectable in upper  and lower  respiratory specimens during the acute phase of infection. The lowest  concentration of SARS-CoV-2 viral copies this assay can detect is 250  copies / mL. A negative result does not preclude SARS-CoV-2 infection  and should not be used as the sole basis for treatment or other  patient management decisions.  A negative result may occur with  improper specimen collection / handling, submission of specimen other  than nasopharyngeal swab, presence of viral mutation(s) within the  areas targeted by this assay, and inadequate number of viral copies  (<250 copies / mL). A negative result must be combined with clinical  observations, patient history, and epidemiological information. If result is POSITIVE SARS-CoV-2 target nucleic acids are DETECTED. The SARS-CoV-2 RNA is generally detectable in upper and lower  respiratory specimens dur ing the acute phase of infection.  Positive  results are indicative of active infection with SARS-CoV-2.  Clinical  correlation with patient history and other diagnostic information is  necessary to determine patient infection status.  Positive results do  not rule out bacterial infection or co-infection with other viruses. If result is PRESUMPTIVE POSTIVE SARS-CoV-2 nucleic acids MAY BE PRESENT.   A presumptive positive result was obtained on the submitted specimen  and confirmed on repeat testing.  While 2019 novel coronavirus  (SARS-CoV-2) nucleic acids may be present in the submitted sample  additional  confirmatory testing may be necessary for epidemiological  and / or clinical management purposes  to differentiate between  SARS-CoV-2 and other Sarbecovirus currently known to infect humans.  If clinically indicated additional testing with an alternate test  methodology [phone]) is advised. The SARS-CoV-2 RNA is generally  detectable in upper and lower respiratory sp ecimens during the acute  phase of infection. The expected result is Negative. Fact Sheet for Patients:  StrictlyIdeas.no Fact Sheet for Healthcare Providers: BankingDealers.co.za This test is not yet approved or cleared by the [place] FDA and has been authorized for detection and/or diagnosis of SARS-CoV-2 by FDA under an Emergency Use Authorization (EUA).  This EUA will remain in effect (meaning this test can be used) for the duration of the COVID-19 declaration under Section 564(b)(1) of the Act, 21 U.S.C. section 360bbb-3(b)(1), unless the authorization is terminated or revoked sooner. Performed at [facility], [place] [address], [place], [place] [address]   Urine culture     Status: Abnormal (Preliminary result)   Collection Time: 07/10/18  5:52 AM  Result Value Ref Range Status   Specimen Description   Final    URINE, CATHETERIZED Performed at [facility] , [place] [address], [place], [place] [address]    Special Requests   Final    NONE Performed at [facility], [place] [address], [place], [place] [address]    Culture (A)  Final    70,000 COLONIES/mL ESCHERICHIA COLI SUSCEPTIBILITIES TO FOLLOW Performed at [facility], [place] [address] [place], [place] [address]    Report Status PENDING  Incomplete  Urine culture     Status: Abnormal   Collection Time: 07/10/18  8:54 AM  Result Value Ref Range Status   Specimen Description   Final    URINE, RANDOM Performed at [facility] [address], [place], [place] [address]    Special Requests   Final    NONE Performed at [facility], [place] [address] [place], [place] [address]    Culture (A)  Final    <10,000 COLONIES/mL  INSIGNIFICANT GROWTH Performed at [facility], [place] [address], [place], [place] [address]    Report Status 07/11/2018 FINAL  Final  Culture, blood (routine x 2) Call MD if unable to obtain prior to antibiotics being given     Status: None (Preliminary result)   Collection Time: 07/10/18 10:14 AM  Result Value Ref Range Status   Specimen Description   Final    RIGHT ANTECUBITAL Performed at [facility] [address], [place], [place] [address]    Special Requests   Final    BOTTLES DRAWN AEROBIC ONLY Blood Culture results may not be optimal due to an inadequate volume of blood received in culture bottles Performed at [facility] [address], [place], [place] [address]    Culture   Final    NO GROWTH 2 DAYS Performed at [facility] [address], [place], [place] [address]    Report Status PENDING  Incomplete  Culture, blood (routine x 2) Call MD if unable to obtain prior to antibiotics being given     Status: None (Preliminary result)   Collection Time: 07/10/18 10:14 AM  Result Value Ref Range Status   Specimen Description   Final    BLOOD LEFT ARM Performed at [facility], [place] [address] [place], [place] [address]    Special Requests   Final    BOTTLES DRAWN AEROBIC ONLY Blood Culture results may not be optimal due to an inadequate volume of blood received in culture bottles Performed at [facility] [address], [place], [place] [address]    Culture   Final    NO GROWTH 2 DAYS Performed at [facility] [address], [place], [place] [address]    Report Status PENDING  Incomplete  SARS Coronavirus 2 (CEPHEID- Performed in [facility] hospital lab), Hosp Order      Status: None   Collection Time: 07/10/18  6:22 PM  Result Value Ref Range Status   SARS Coronavirus 2 NEGATIVE NEGATIVE Final    Comment: (NOTE) If result is NEGATIVE SARS-CoV-2 target nucleic acids are NOT DETECTED. The SARS-CoV-2 RNA is generally detectable in upper and lower  respiratory specimens during the acute phase of infection. The lowest  concentration of SARS-CoV-2 viral copies this assay can detect is 250  copies / mL. A negative result does not preclude SARS-CoV-2 infection  and should not be used as the sole basis for treatment or other  patient management decisions.  A negative result may occur with  improper specimen collection / handling, submission of specimen other  than nasopharyngeal swab, presence of viral mutation(s) within the  areas targeted by this assay, and inadequate number of viral copies  (<250 copies / mL). A negative result must be combined with clinical  observations, patient history, and epidemiological information. If result is POSITIVE SARS-CoV-2 target nucleic acids are DETECTED. The SARS-CoV-2 RNA is generally detectable in upper and lower  respiratory specimens dur ing the acute phase of infection.  Positive  results are indicative of active infection with SARS-CoV-2.  Clinical  correlation with patient history and other diagnostic information is  necessary to determine patient infection status.  Positive results do  not rule out bacterial infection or co-infection with other viruses. If result is PRESUMPTIVE POSTIVE SARS-CoV-2 nucleic acids MAY BE PRESENT.   A presumptive positive result was obtained on the submitted specimen  and confirmed on repeat testing.  While 2019  novel coronavirus  (SARS-CoV-2) nucleic acids may be present in the submitted sample  additional confirmatory testing may be necessary for epidemiological  and / or clinical management purposes  to differentiate between  SARS-CoV-2 and other Sarbecovirus currently known to  infect humans.  If clinically indicated additional testing with an alternate test  methodology [phone]) is advised. The SARS-CoV-2 RNA is generally  detectable in upper and lower respiratory sp ecimens during the acute  phase of infection. The expected result is Negative. Fact Sheet for Patients:  StrictlyIdeas.no Fact Sheet for Healthcare Providers: BankingDealers.co.za This test is not yet approved or cleared by the [place] FDA and has been authorized for detection and/or diagnosis of SARS-CoV-2 by FDA under an Emergency Use Authorization (EUA).  This EUA will remain in effect (meaning this test can be used) for the duration of the COVID-19 declaration under Section 564(b)(1) of the Act, 21 U.S.C. section 360bbb-3(b)(1), unless the authorization is terminated or revoked sooner. Performed at [facility], [place] [address] [place], [place] [address]   Respiratory Panel by PCR     Status: None   Collection Time: 07/10/18  6:26 PM  Result Value Ref Range Status   Adenovirus NOT DETECTED NOT DETECTED Final   Coronavirus 229E NOT DETECTED NOT DETECTED Final    Comment: (NOTE) The Coronavirus on the Respiratory Panel, DOES NOT test for the novel  Coronavirus (2019 nCoV)    Coronavirus HKU1 NOT DETECTED NOT DETECTED Final   Coronavirus NL63 NOT DETECTED NOT DETECTED Final   Coronavirus OC43 NOT DETECTED NOT DETECTED Final   Metapneumovirus NOT DETECTED NOT DETECTED Final   Rhinovirus / Enterovirus NOT DETECTED NOT DETECTED Final   Influenza A NOT DETECTED NOT DETECTED Final   Influenza B NOT DETECTED NOT DETECTED Final   Parainfluenza Virus 1 NOT DETECTED NOT DETECTED Final   Parainfluenza Virus 2 NOT DETECTED NOT DETECTED Final   Parainfluenza Virus 3 NOT DETECTED NOT DETECTED Final   Parainfluenza Virus 4 NOT DETECTED NOT DETECTED Final   Respiratory Syncytial Virus NOT DETECTED NOT DETECTED Final   Bordetella  pertussis NOT DETECTED NOT DETECTED Final   Chlamydophila pneumoniae NOT DETECTED NOT DETECTED Final   Mycoplasma pneumoniae NOT DETECTED NOT DETECTED Final    Comment: Performed at [facility], [place]. [address] [place], [place] [address]         Radiology Studies: Dg Chest Port 1 View  Result Date: 07/10/2018 CLINICAL DATA:  Dyspnea EXAM: PORTABLE CHEST 1 VIEW COMPARISON:  COPD.  Fever and chills FINDINGS: Cardiomegaly. Right hilar enlargement that is vascular based on CT. There is likely pulmonary hypertension. Asymmetric left base opacity that is least partially chronic as there is fibrotic interstitial densities on a Aguir[place] 2020 CT. There is also emphysema. Remote left rib fractures. IMPRESSION: 1. Equivocal for left lower lobe pneumonia. 2. Cardiomegaly with pulmonary artery enlargement and interstitial lung disease. Electronically Signed   By: Monte Fantasia M.D.   On: 07/10/2018 18:59        Scheduled Meds: . atorvastatin  40 mg Oral q1800  . carvedilol  6.25 mg Oral BID WC  . cholecalciferol  400 Units Oral Daily  . enoxaparin (LOVENOX) injection  40 mg Subcutaneous Q24H  . insulin aspart  0-5 Units Subcutaneous QHS  . insulin aspart  0-9 Units Subcutaneous TID WC  . insulin aspart  4 Units Subcutaneous TID WC  . insulin glargine  20 Units Subcutaneous Daily  . ipratropium-albuterol  3 mL  Nebulization TID  . levothyroxine  88 mcg Oral QAC breakfast  . pantoprazole  40 mg Oral Daily  . predniSONE  40 mg Oral Q breakfast  . Ensure Max Protein  11 oz Oral BID BM   Continuous Infusions: . azithromycin 500 mg (07/12/18 0834)  . cefTRIAXone (ROCEPHIN)  IV 200 mL/hr at 07/12/18 0600     LOS: 2 days    Time spent: 35 mins.More than 50% of that time was spent in counseling and/or coordination of care.      Shelly Coss, MD [facility] Pager [phone]  If 7PM-7AM, please contact night-coverage [email or web address] Password [email or web address] 07/12/2018, 12:20 PM

## 2018-07-13 LAB — CBC WITH DIFFERENTIAL/PLATELET
Abs Immature Granulocytes: 0.17 10*3/uL — ABNORMAL HIGH (ref 0.00–0.07)
Basophils Absolute: 0 10*3/uL (ref 0.0–0.1)
Basophils Relative: 0 %
Eosinophils Absolute: 0 10*3/uL (ref 0.0–0.5)
Eosinophils Relative: 0 %
HCT: 32.2 % — ABNORMAL LOW (ref 36.0–46.0)
Hemoglobin: 10.7 g/dL — ABNORMAL LOW (ref 12.0–15.0)
Immature Granulocytes: 1 %
Lymphocytes Relative: 10 %
Lymphs Abs: 1.4 10*3/uL (ref 0.7–4.0)
MCH: 28.8 pg (ref 26.0–34.0)
MCHC: 33.2 g/dL (ref 30.0–36.0)
MCV: 86.8 fL (ref 80.0–100.0)
Monocytes Absolute: 1.2 10*3/uL — ABNORMAL HIGH (ref 0.1–1.0)
Monocytes Relative: 9 %
Neutro Abs: 10.6 10*3/uL — ABNORMAL HIGH (ref 1.7–7.7)
Neutrophils Relative %: 80 %
Platelets: 225 10*3/uL (ref 150–400)
RBC: 3.71 MIL/uL — ABNORMAL LOW (ref 3.87–5.11)
RDW: 14.8 % (ref 11.5–15.5)
WBC: 13.4 10*3/uL — ABNORMAL HIGH (ref 4.0–10.5)
nRBC: 0 % (ref 0.0–0.2)

## 2018-07-13 LAB — URINE CULTURE: Culture: 70000 — AB

## 2018-07-13 LAB — CULTURE, BLOOD (ROUTINE X 2): Special Requests: ADEQUATE

## 2018-07-13 LAB — GLUCOSE, CAPILLARY: Glucose-Capillary: 130 mg/dL — ABNORMAL HIGH (ref 70–99)

## 2018-07-13 MED ORDER — IPRATROPIUM-ALBUTEROL 0.5-2.5 (3) MG/3ML IN SOLN: 3.0000 mL | Freq: Two times a day (BID) | RESPIRATORY_TRACT | Status: DC

## 2018-07-13 MED ORDER — CEFDINIR 300 MG PO CAPS: 300.0000 mg | ORAL_CAPSULE | Freq: Two times a day (BID) | ORAL | 0 refills | Status: AC

## 2018-07-13 MED ORDER — PREDNISONE 20 MG PO TABS: 40.0000 mg | ORAL_TABLET | Freq: Every day | ORAL | 0 refills | Status: DC

## 2018-07-13 NOTE — Progress Notes (Signed)
Pts Iv removed with a clean and dry dressing intact. Pt denies pain at this time with no s/s of distress noted. Pt states she has home O2 through [facility] and at this time does not feel she needs additional assistance due to daughter being home 24/7 who helps provide care. Educated pt on d/c instructions along with follow up appointments, all questions were answered at that time. Pt taken to the main entrance via wheelchair with nursing staff present,

## 2018-07-13 NOTE — Progress Notes (Signed)
Antimicrobial Stewardship - Brief Note  Patient with E. Coli in urine and blood cultures.  Per MIC, the organism is noted to have decreased susceptibility.  Pt sent home on cefdinir.  Due to concern concentrations of oral cephalosporin will not adequately treat the E. Coli contacted Dr Tawanna Solo to discuss.   Plan:  Stop cefdinir and start ciprofloxacin 500mg  PO BID x 7d  Called patient (had not picked up cefdinir) to inform her new script called in for ciprofloxacin  Doreene Eland, PharmD, BCPS.   Work Cell: [phone] 07/13/2018 3:13 PM

## 2018-07-13 NOTE — Discharge Summary (Signed)
Physician Discharge Summary  Caitlyn Aguirre GYK:[id number] DOB: 07-Dec-1935 DOA: 07/10/2018  PCP: Axel Filler, MD  Admit date: 07/10/2018 Discharge date: 07/13/2018  Admitted From: Home Disposition:  Home  Discharge Condition:Stable CODE STATUS:FULL, Diet recommendation: Heart Healthy / Carb Modified   Brief/Interim Summary: Patient is a 83 year old female with history of COPD, on home oxygen at 2 L/min as needed, diabetes mellitus, hypertension, hyperlipidemia, hypothyroidism who presented to the emergency room with fever of 103.4 F, chills, shortness of breath for last 2 days.Patient was found to be febrile on presentation.  Chest x-ray showed possible left lower lobe pneumonia.  Urinalysis was suggestive of urinary tract infection.  Blood cultures showed Ecoli. Patient was started on IV antibiotics.  Her respiratory status has improved and currently on baseline.  Currently she is hemodynamically stable, afebrile.  Blood cultures show pansensitive E. coli.  Antibiotics will be changed to oral today.  She is stable for discharge to home.  Following problems were addressed during her hospitalization:  E. coli bacteremia: Presented with fever.  Most likely source is urine.Sensitive to ceftriaxone. Abx changed to oral.  Urinary tract infection: Most likely source for bacteremia.  Urine culture showed 70,000 colonies of E. coli.  Continue ceftriaxone.  Suspected pneumonia: Chest x-ray showed possible left lower lobe pneumonia.  Started on  ceftriaxone with azithromycin.  Currently on 2 L of oxygen per minute which is her baseline. Auscultation revealed bibasilar crackles.  History of COPD: Past smoker.  On 2 L of oxygen per minute at home as needed.CXR showed cardiomegaly with pulmonary artery enlargement and interstitial lung disease.  Auscultation revealed mild wheezes,basal crackles.  Started on prednisone.Follow up with pulmonology recommended.  Acute kidney injury: Baseline  creatinine normal.  Presented with acute kidney injury.  Follow up with PCP in 1-2 weeks and do a BMP test.  Hypothyroidism: Continue Synthyroid.  Diabetes mellitus type 2: Hemoglobin A1c of 6.9. Continue home regimen.  Hypertension: Resume home antihypertensives.  Debility/deconditioning: Home health recommended by physical therapy    Discharge Diagnoses:  Principal Problem:   Sepsis ([place]) Active Problems:   Hypothyroidism   Type 2 diabetes mellitus with other specified complication ([place])   Essential hypertension   COPD ([place])   GERD (gastroesophageal reflux disease)   Acute lower UTI   CAP (community acquired pneumonia)   AKI (acute kidney injury) [facility])    Discharge Instructions  Discharge Instructions    Diet - low sodium heart healthy   Complete by:  As directed    Discharge instructions   Complete by:  As directed    1)Please follow up with your PCP in 1-2 weeks.Check BMP ,CBC tests during the follow up. 2)Take prescribed medications as instructed. 3)Follow up with Pulmonology through referral of your PCP.   Increase activity slowly   Complete by:  As directed      Allergies as of 07/13/2018   No Known Allergies     Medication List    STOP taking these medications   HYDROcodone-homatropine 5-1.5 MG/5ML syrup Commonly known as:  Hydromet     TAKE these medications   albuterol 108 (90 Base) MCG/ACT inhaler Commonly known as:  VENTOLIN HFA Inhale 1-2 puffs into the lungs every 6 (six) hours as needed for wheezing or shortness of breath.   albuterol (2.5 MG/3ML) 0.083% nebulizer solution Commonly known as:  PROVENTIL Take 3 mLs (2.5 mg total) by nebulization every 4 (four) hours as needed for wheezing or shortness of breath.   amLODipine 5  MG tablet Commonly known as:  NORVASC TAKE 1 TABLET BY MOUTH  DAILY   aspirin EC 81 MG tablet Take 1 tablet (81 mg total) by mouth daily.   atorvastatin 40 MG tablet Commonly known as:  LIPITOR TAKE 1  TABLET BY MOUTH  DAILY AT 6 PM. What changed:  See the new instructions.   carvedilol 25 MG tablet Commonly known as:  COREG TAKE 1 TABLET BY MOUTH TWO  TIMES DAILY WITH A MEAL What changed:  See the new instructions.   cefdinir 300 MG capsule Commonly known as:  OMNICEF Take 1 capsule (300 mg total) by mouth 2 (two) times daily for 10 days.   chlorthalidone 25 MG tablet Commonly known as:  HYGROTON Take 1 tablet (25 mg total) by mouth daily.   levothyroxine 88 MCG tablet Commonly known as:  SYNTHROID TAKE 1 TABLET BY MOUTH  EVERY MORNING 30 MINUTES  BEFORE OTHER MEDICATIONS  AND FOOD. TAKE WITH WATER  ONLY. What changed:  See the new instructions.   lisinopril 40 MG tablet Commonly known as:  ZESTRIL TAKE 1 TABLET BY MOUTH  DAILY   metFORMIN 1000 MG tablet Commonly known as:  GLUCOPHAGE TAKE 1 TABLET BY MOUTH  TWICE A DAY WITH A MEAL What changed:  See the new instructions.   omeprazole 20 MG capsule Commonly known as:  PRILOSEC TAKE 1 CAPSULE BY MOUTH  DAILY WITH SUPPER What changed:  See the new instructions.   OXYGEN Inhale into the lungs at bedtime as needed (for breathing).   PARoxetine 30 MG tablet Commonly known as:  PAXIL TAKE 1 TABLET BY MOUTH  DAILY   predniSONE 20 MG tablet Commonly known as:  DELTASONE Take 2 tablets (40 mg total) by mouth daily with breakfast. Start taking on:  July 14, 2018   tiotropium 18 MCG inhalation capsule Commonly known as:  Spiriva HandiHaler Place 1 capsule (18 mcg total) into inhaler and inhale daily.   Vitamin D3 10 MCG (400 UNIT) tablet Take 1 tablet (400 Units total) by mouth daily.      Follow-up Information    Axel Filler, MD. Schedule an appointment as soon as possible for a visit in 1 week(s).   Specialty:  Internal Medicine Contact information: [place] [address] [phone]          No Known Allergies  Consultations:  None   Procedures/Studies: Dg Chest  Port 1 View  Result Date: 07/10/2018 CLINICAL DATA:  Dyspnea EXAM: PORTABLE CHEST 1 VIEW COMPARISON:  COPD.  Fever and chills FINDINGS: Cardiomegaly. Right hilar enlargement that is vascular based on CT. There is likely pulmonary hypertension. Asymmetric left base opacity that is least partially chronic as there is fibrotic interstitial densities on a January 2020 CT. There is also emphysema. Remote left rib fractures. IMPRESSION: 1. Equivocal for left lower lobe pneumonia. 2. Cardiomegaly with pulmonary artery enlargement and interstitial lung disease. Electronically Signed   By: Monte Fantasia M.D.   On: 07/10/2018 18:59   Dg Chest Port 1 View  Result Date: 07/10/2018 CLINICAL DATA:  Shortness of breath EXAM: PORTABLE CHEST 1 VIEW COMPARISON:  04/06/2018 chest radiograph and chest CT 02/02/2018 FINDINGS: Mild cardiomegaly with mild interstitial pulmonary edema. Focal opacity at the right hilum, likely enlarged pulmonary artery. Atelectasis at the left lung base. IMPRESSION: Mild cardiomegaly and mild interstitial pulmonary edema. Electronically Signed   By: Ulyses Jarred M.D.   On: 07/10/2018 05:27  Subjective: Patient seen and examined the bedside this morning.  Very comfortable.  Hemodynamically stable.  Eager to go home.  Stable for discharge  Discharge Exam: Vitals:   07/13/18 0603 07/13/18 0918  BP: (!) 152/76   Pulse: 63 84  Resp: 18 17  Temp: 98.3 F (36.8 C)   SpO2: 99% 91%   Vitals:   07/12/18 2045 07/13/18 0500 07/13/18 0603 07/13/18 0918  BP: 131/78  (!) 152/76   Pulse: 77  63 84  Resp: (!) 21  18 17   Temp: 98.2 F (36.8 C)  98.3 F (36.8 C)   TempSrc: Oral  Oral   SpO2: 98%  99% 91%  Weight:  91.1 kg    Height:        General: Pt is alert, awake, not in acute distress, elderly pleasant female Cardiovascular: RRR, S1/S2 +, no rubs, no gallops Respiratory: Decreased air entry bilaterally, mild crackles on the basis Abdominal: Soft, NT, ND, bowel sounds  + Extremities: no edema, no cyanosis    The results of significant diagnostics from this hospitalization (including imaging, microbiology, ancillary and laboratory) are listed below for reference.     Microbiology: Recent Results (from the past 240 hour(s))  Blood Culture (routine x 2)     Status: Abnormal   Collection Time: 07/10/18  3:16 AM  Result Value Ref Range Status   Specimen Description   Final    BLOOD LEFT ANTECUBITAL Performed at [facility] [address] [place], [place] [address]    Special Requests   Final    BOTTLES DRAWN AEROBIC AND ANAEROBIC Blood Culture adequate volume Performed at [facility] [address] [place], [place] [address]    Culture  Setup Time   Final    GRAM NEGATIVE RODS IN BOTH AEROBIC AND ANAEROBIC BOTTLES CRITICAL RESULT CALLED TO, READ BACK BY AND VERIFIED WITH: Audrea Muscat [id number] 07/11/2018 Mena Goes Performed at [facility], [place] [address], [place], [place] [address]    Culture ESCHERICHIA COLI (A)  Final   Report Status 07/13/2018 FINAL  Final   Organism ID, Bacteria ESCHERICHIA COLI  Final      Susceptibility   Escherichia coli - MIC*    AMPICILLIN >=32 RESISTANT Resistant     CEFAZOLIN >=64 RESISTANT Resistant     CEFEPIME <=1 SENSITIVE Sensitive     CEFTAZIDIME 4 SENSITIVE Sensitive     CEFTRIAXONE 4 SENSITIVE Sensitive     CIPROFLOXACIN <=0.25 SENSITIVE Sensitive     GENTAMICIN <=1 SENSITIVE Sensitive     IMIPENEM 0.5 SENSITIVE Sensitive     TRIMETH/SULFA <=20 SENSITIVE Sensitive     AMPICILLIN/SULBACTAM >=32 RESISTANT Resistant     PIP/TAZO <=4 SENSITIVE Sensitive     Extended ESBL NEGATIVE Sensitive     * ESCHERICHIA COLI  Blood Culture ID Panel (Reflexed)     Status: Abnormal   Collection Time: 07/10/18  3:16 AM  Result Value Ref Range Status   Enterococcus species NOT DETECTED NOT DETECTED Final   Listeria monocytogenes NOT DETECTED NOT DETECTED Final    Staphylococcus species NOT DETECTED NOT DETECTED Final   Staphylococcus aureus (BCID) NOT DETECTED NOT DETECTED Final   Streptococcus species NOT DETECTED NOT DETECTED Final   Streptococcus agalactiae NOT DETECTED NOT DETECTED Final   Streptococcus pneumoniae NOT DETECTED NOT DETECTED Final   Streptococcus pyogenes NOT DETECTED NOT DETECTED Final   Acinetobacter baumannii NOT DETECTED NOT DETECTED Final   Enterobacteriaceae species DETECTED (A) NOT  DETECTED Final    Comment: Enterobacteriaceae represent a large family of gram-negative bacteria, not a single organism. CRITICAL RESULT CALLED TO, READ BACK BY AND VERIFIED WITH: J. GRIMSLEY,PHARMD [id number] 07/11/2018 T. TYSOR    Enterobacter cloacae complex NOT DETECTED NOT DETECTED Final   Escherichia coli DETECTED (A) NOT DETECTED Final    Comment: CRITICAL RESULT CALLED TO, READ BACK BY AND VERIFIED WITH: J. Fabiola Backer [id number] 07/11/2018 T. TYSOR    Klebsiella oxytoca NOT DETECTED NOT DETECTED Final   Klebsiella pneumoniae NOT DETECTED NOT DETECTED Final   Proteus species NOT DETECTED NOT DETECTED Final   Serratia marcescens NOT DETECTED NOT DETECTED Final   Carbapenem resistance NOT DETECTED NOT DETECTED Final   Haemophilus influenzae NOT DETECTED NOT DETECTED Final   Neisseria meningitidis NOT DETECTED NOT DETECTED Final   Pseudomonas aeruginosa NOT DETECTED NOT DETECTED Final   Candida albicans NOT DETECTED NOT DETECTED Final   Candida glabrata NOT DETECTED NOT DETECTED Final   Candida krusei NOT DETECTED NOT DETECTED Final   Candida parapsilosis NOT DETECTED NOT DETECTED Final   Candida tropicalis NOT DETECTED NOT DETECTED Final    Comment: Performed at [facility], [place]. [address] [place], [place] [address]  SARS Coronavirus 2 (CEPHEID- Performed in [facility] lab), Hosp Order     Status: None   Collection Time: 07/10/18  3:17 AM  Result Value Ref Range Status   SARS Coronavirus 2 NEGATIVE NEGATIVE Final     Comment: (NOTE) If result is NEGATIVE SARS-CoV-2 target nucleic acids are NOT DETECTED. The SARS-CoV-2 RNA is generally detectable in upper and lower  respiratory specimens during the acute phase of infection. The lowest  concentration of SARS-CoV-2 viral copies this assay can detect is 250  copies / mL. A negative result does not preclude SARS-CoV-2 infection  and should not be used as the sole basis for treatment or other  patient management decisions.  A negative result may occur with  improper specimen collection / handling, submission of specimen other  than nasopharyngeal swab, presence of viral mutation(s) within the  areas targeted by this assay, and inadequate number of viral copies  (<250 copies / mL). A negative result must be combined with clinical  observations, patient history, and epidemiological information. If result is POSITIVE SARS-CoV-2 target nucleic acids are DETECTED. The SARS-CoV-2 RNA is generally detectable in upper and lower  respiratory specimens dur ing the acute phase of infection.  Positive  results are indicative of active infection with SARS-CoV-2.  Clinical  correlation with patient history and other diagnostic information is  necessary to determine patient infection status.  Positive results do  not rule out bacterial infection or co-infection with other viruses. If result is PRESUMPTIVE POSTIVE SARS-CoV-2 nucleic acids MAY BE PRESENT.   A presumptive positive result was obtained on the submitted specimen  and confirmed on repeat testing.  While 2019 novel coronavirus  (SARS-CoV-2) nucleic acids may be present in the submitted sample  additional confirmatory testing may be necessary for epidemiological  and / or clinical management purposes  to differentiate between  SARS-CoV-2 and other Sarbecovirus currently known to infect humans.  If clinically indicated additional testing with an alternate test  methodology [phone]) is advised. The SARS-CoV-2  RNA is generally  detectable in upper and lower respiratory sp ecimens during the acute  phase of infection. The expected result is Negative. Fact Sheet for Patients:  StrictlyIdeas.no Fact Sheet for Healthcare Providers: BankingDealers.co.za This test is not yet approved or  cleared by the [place] and has been authorized for detection and/or diagnosis of SARS-CoV-2 by FDA under an Emergency Use Authorization (EUA).  This EUA will remain in effect (meaning this test can be used) for the duration of the COVID-19 declaration under Section 564(b)(1) of the Act, 21 U.S.C. section 360bbb-3(b)(1), unless the authorization is terminated or revoked sooner. Performed at [facility], [place] [address] [place], [place] [address]   Urine culture     Status: Abnormal   Collection Time: 07/10/18  5:52 AM  Result Value Ref Range Status   Specimen Description   Final    URINE, CATHETERIZED Performed at [facility] [address], [place], [place] [address]    Special Requests   Final    NONE Performed at [facility], [place] [address] [place], [place] [address]    Culture 70,000 COLONIES/mL ESCHERICHIA COLI (A)  Final   Report Status 07/13/2018 FINAL  Final   Organism ID, Bacteria ESCHERICHIA COLI (A)  Final      Susceptibility   Escherichia coli - MIC*    AMPICILLIN >=32 RESISTANT Resistant     CEFAZOLIN >=64 RESISTANT Resistant     CEFTRIAXONE 8 SENSITIVE Sensitive     CIPROFLOXACIN <=0.25 SENSITIVE Sensitive     GENTAMICIN <=1 SENSITIVE Sensitive     IMIPENEM 0.5 SENSITIVE Sensitive     NITROFURANTOIN <=16 SENSITIVE Sensitive     TRIMETH/SULFA <=20 SENSITIVE Sensitive     AMPICILLIN/SULBACTAM >=32 RESISTANT Resistant     PIP/TAZO <=4 SENSITIVE Sensitive     Extended ESBL NEGATIVE Sensitive     * 70,000 COLONIES/mL ESCHERICHIA COLI  Urine culture     Status: Abnormal    Collection Time: 07/10/18  8:54 AM  Result Value Ref Range Status   Specimen Description   Final    URINE, RANDOM Performed at [facility] [address], [place], [place] [address]    Special Requests   Final    NONE Performed at [facility], [place] [address], [place], [place] [address]    Culture (A)  Final    <10,000 COLONIES/mL INSIGNIFICANT GROWTH Performed at [facility] [address], [place], [place] [address]    Report Status 07/11/2018 FINAL  Final  Culture, blood (routine x 2) Call MD if unable to obtain prior to antibiotics being given     Status: None (Preliminary result)   Collection Time: 07/10/18 10:14 AM  Result Value Ref Range Status   Specimen Description   Final    RIGHT ANTECUBITAL Performed at [facility] [address], [place], [place] [address]    Special Requests   Final    BOTTLES DRAWN AEROBIC ONLY Blood Culture results may not be optimal due to an inadequate volume of blood received in culture bottles Performed at [facility] [address] [place], [place] [address]    Culture   Final    NO GROWTH 3 DAYS Performed at [facility], [place] [address] [place], [place] [address]    Report Status PENDING  Incomplete  Culture, blood (routine x 2) Call MD if unable to obtain prior to antibiotics being given     Status: None (Preliminary result)   Collection Time: 07/10/18 10:14 AM  Result Value Ref Range Status   Specimen Description   Final    BLOOD LEFT ARM Performed at [facility], [place]  [address] [place], [place] [address]    Special Requests   Final    BOTTLES DRAWN AEROBIC ONLY Blood Culture results may not be optimal due to an inadequate volume of blood received in culture bottles Performed at [facility] [address], [place], [place] [address]    Culture   Final    NO GROWTH 3 DAYS Performed at [facility],  [place] [address], [place], [place] [address]    Report Status PENDING  Incomplete  SARS Coronavirus 2 (CEPHEID- Performed in [facility] hospital lab), Hosp Order     Status: None   Collection Time: 07/10/18  6:22 PM  Result Value Ref Range Status   SARS Coronavirus 2 NEGATIVE NEGATIVE Final    Comment: (NOTE) If result is NEGATIVE SARS-CoV-2 target nucleic acids are NOT DETECTED. The SARS-CoV-2 RNA is generally detectable in upper and lower  respiratory specimens during the acute phase of infection. The lowest  concentration of SARS-CoV-2 viral copies this assay can detect is 250  copies / mL. A negative result does not preclude SARS-CoV-2 infection  and should not be used as the sole basis for treatment or other  patient management decisions.  A negative result may occur with  improper specimen collection / handling, submission of specimen other  than nasopharyngeal swab, presence of viral mutation(s) within the  areas targeted by this assay, and inadequate number of viral copies  (<250 copies / mL). A negative result must be combined with clinical  observations, patient history, and epidemiological information. If result is POSITIVE SARS-CoV-2 target nucleic acids are DETECTED. The SARS-CoV-2 RNA is generally detectable in upper and lower  respiratory specimens dur ing the acute phase of infection.  Positive  results are indicative of active infection with SARS-CoV-2.  Clinical  correlation with patient history and other diagnostic information is  necessary to determine patient infection status.  Positive results do  not rule out bacterial infection or co-infection with other viruses. If result is PRESUMPTIVE POSTIVE SARS-CoV-2 nucleic acids MAY BE PRESENT.   A presumptive positive result was obtained on the submitted specimen  and confirmed on repeat testing.  While 2019 novel coronavirus  (SARS-CoV-2) nucleic acids may be present in the submitted sample  additional confirmatory  testing may be necessary for epidemiological  and / or clinical management purposes  to differentiate between  SARS-CoV-2 and other Sarbecovirus currently known to infect humans.  If clinically indicated additional testing with an alternate test  methodology [phone]) is advised. The SARS-CoV-2 RNA is generally  detectable in upper and lower respiratory sp ecimens during the acute  phase of infection. The expected result is Negative. Fact Sheet for Patients:  StrictlyIdeas.no Fact Sheet for Healthcare Providers: BankingDealers.co.za This test is not yet approved or cleared by the [place] FDA and has been authorized for detection and/or diagnosis of SARS-CoV-2 by FDA under an Emergency Use Authorization (EUA).  This EUA will remain in effect (meaning this test can be used) for the duration of the COVID-19 declaration under Section 564(b)(1) of the Act, 21 U.S.C. section 360bbb-3(b)(1), unless the authorization is terminated or revoked sooner. Performed at [facility], [place] [address] [place], [place] [address]   Respiratory Panel by PCR     Status: None   Collection Time: 07/10/18  6:26 PM  Result Value Ref Range Status   Adenovirus NOT DETECTED NOT DETECTED Final   Coronavirus 229E NOT DETECTED NOT DETECTED Final    Comment: (NOTE) The  Coronavirus on the Respiratory Panel, DOES NOT test for the novel  Coronavirus (2019 nCoV)    Coronavirus HKU1 NOT DETECTED NOT DETECTED Final   Coronavirus NL63 NOT DETECTED NOT DETECTED Final   Coronavirus OC43 NOT DETECTED NOT DETECTED Final   Metapneumovirus NOT DETECTED NOT DETECTED Final   Rhinovirus / Enterovirus NOT DETECTED NOT DETECTED Final   Influenza A NOT DETECTED NOT DETECTED Final   Influenza B NOT DETECTED NOT DETECTED Final   Parainfluenza Virus 1 NOT DETECTED NOT DETECTED Final   Parainfluenza Virus 2 NOT DETECTED NOT DETECTED Final   Parainfluenza Virus 3  NOT DETECTED NOT DETECTED Final   Parainfluenza Virus 4 NOT DETECTED NOT DETECTED Final   Respiratory Syncytial Virus NOT DETECTED NOT DETECTED Final   Bordetella pertussis NOT DETECTED NOT DETECTED Final   Chlamydophila pneumoniae NOT DETECTED NOT DETECTED Final   Mycoplasma pneumoniae NOT DETECTED NOT DETECTED Final    Comment: Performed at [facility], [place] [address], [place], [place] [address]     Labs: BNP (last 3 results) Recent Labs    07/10/18 1904  BNP 737.1*   Basic Metabolic Panel: Recent Labs  Lab 07/10/18 0316 07/10/18 2220 07/11/18 1022 07/12/18 0316 07/12/18 1306  NA 138 137 137 136 137  K 3.9 4.1 4.0 4.2 4.1  CL 108 107 108 105 105  CO2 20* 20* 18* 23 24  GLUCOSE 243* 227* 401* 226* 203*  BUN 24* 27* 30* 41* 47*  CREATININE 1.40* 1.51* 1.28* 1.21* 1.33*  CALCIUM 9.2 9.0 9.1 9.6 10.0   Liver Function Tests: Recent Labs  Lab 07/10/18 0316 07/10/18 2220  AST 17 17  ALT 9 12  ALKPHOS 127* 106  BILITOT 1.4* 1.0  PROT 7.6 7.0  ALBUMIN 3.8 3.4*   No results for input(s): LIPASE, AMYLASE in the last 168 hours. No results for input(s): AMMONIA in the last 168 hours. CBC: Recent Labs  Lab 07/10/18 0316 07/11/18 0419 07/12/18 0316 07/13/18 0556  WBC 14.3* 12.2* 17.3* 13.4*  NEUTROABS 12.9*  --  15.0* 10.6*  HGB 11.0* 10.3* 9.9* 10.7*  HCT 33.2* 31.7* 30.1* 32.2*  MCV 88.5 90.1 87.8 86.8  PLT 164 151 191 225   Cardiac Enzymes: Recent Labs  Lab 07/10/18 1904  CKTOTAL 117  TROPONINI 0.14*   BNP: Invalid input(s): POCBNP CBG: Recent Labs  Lab 07/12/18 0742 07/12/18 1141 07/12/18 1634 07/12/18 2048 07/13/18 0802  GLUCAP 173* 179* 189* 280* 130*   D-Dimer Recent Labs    07/10/18 1904  DDIMER 2.73*   Hgb A1c No results for input(s): HGBA1C in the last 72 hours. Lipid Profile No results for input(s): CHOL, HDL, LDLCALC, TRIG, CHOLHDL, LDLDIRECT in the last 72 hours. Thyroid function studies No results for input(s): TSH,  T4TOTAL, T3FREE, THYROIDAB in the last 72 hours.  Invalid input(s): FREET3 Anemia work up Recent Labs    07/10/18 1904  FERRITIN 78   Urinalysis    Component Value Date/Time   COLORURINE YELLOW 07/10/2018 0552   APPEARANCEUR HAZY (A) 07/10/2018 0552   LABSPEC 1.012 07/10/2018 [place] 5.0 07/10/2018 0552   GLUCOSEU NEGATIVE 07/10/2018 0552   HGBUR LARGE (A) 07/10/2018 0552   HGBUR negative 04/22/2006 1323   BILIRUBINUR NEGATIVE 07/10/2018 0552   BILIRUBINUR Negative 08/21/2015 1429   KETONESUR NEGATIVE 07/10/2018 0552   PROTEINUR 30 (A) 07/10/2018 0552   UROBILINOGEN 2.0 (H) 11/07/2015 1750   NITRITE NEGATIVE 07/10/2018 0552   LEUKOCYTESUR MODERATE (A) 07/10/2018 0626  Sepsis Labs Invalid input(s): PROCALCITONIN,  WBC,  LACTICIDVEN Microbiology Recent Results (from the past 240 hour(s))  Blood Culture (routine x 2)     Status: Abnormal   Collection Time: 07/10/18  3:16 AM  Result Value Ref Range Status   Specimen Description   Final    BLOOD LEFT ANTECUBITAL Performed at [facility] [address], [place], [place] [address]    Special Requests   Final    BOTTLES DRAWN AEROBIC AND ANAEROBIC Blood Culture adequate volume Performed at [facility] [address], [place], [place] [address]    Culture  Setup Time   Final    GRAM NEGATIVE RODS IN BOTH AEROBIC AND ANAEROBIC BOTTLES CRITICAL RESULT CALLED TO, READ BACK BY AND VERIFIED WITH: Audrea Muscat [id number] 07/11/2018 Mena Goes Performed at [facility], [place] [address], [place], [place] [address]    Culture ESCHERICHIA COLI (A)  Final   Report Status 07/13/2018 FINAL  Final   Organism ID, Bacteria ESCHERICHIA COLI  Final      Susceptibility   Escherichia coli - MIC*    AMPICILLIN >=32 RESISTANT Resistant     CEFAZOLIN >=64 RESISTANT Resistant     CEFEPIME <=1 SENSITIVE Sensitive     CEFTAZIDIME 4 SENSITIVE Sensitive     CEFTRIAXONE 4 SENSITIVE Sensitive      CIPROFLOXACIN <=0.25 SENSITIVE Sensitive     GENTAMICIN <=1 SENSITIVE Sensitive     IMIPENEM 0.5 SENSITIVE Sensitive     TRIMETH/SULFA <=20 SENSITIVE Sensitive     AMPICILLIN/SULBACTAM >=32 RESISTANT Resistant     PIP/TAZO <=4 SENSITIVE Sensitive     Extended ESBL NEGATIVE Sensitive     * ESCHERICHIA COLI  Blood Culture ID Panel (Reflexed)     Status: Abnormal   Collection Time: 07/10/18  3:16 AM  Result Value Ref Range Status   Enterococcus species NOT DETECTED NOT DETECTED Final   Listeria monocytogenes NOT DETECTED NOT DETECTED Final   Staphylococcus species NOT DETECTED NOT DETECTED Final   Staphylococcus aureus (BCID) NOT DETECTED NOT DETECTED Final   Streptococcus species NOT DETECTED NOT DETECTED Final   Streptococcus agalactiae NOT DETECTED NOT DETECTED Final   Streptococcus pneumoniae NOT DETECTED NOT DETECTED Final   Streptococcus pyogenes NOT DETECTED NOT DETECTED Final   Acinetobacter baumannii NOT DETECTED NOT DETECTED Final   Enterobacteriaceae species DETECTED (A) NOT DETECTED Final    Comment: Enterobacteriaceae represent a large family of gram-negative bacteria, not a single organism. CRITICAL RESULT CALLED TO, READ BACK BY AND VERIFIED WITH: J. GRIMSLEY,PHARMD [id number] 07/11/2018 T. TYSOR    Enterobacter cloacae complex NOT DETECTED NOT DETECTED Final   Escherichia coli DETECTED (A) NOT DETECTED Final    Comment: CRITICAL RESULT CALLED TO, READ BACK BY AND VERIFIED WITH: J. GRIMSLEY,PHARMD [id number] 07/11/2018 T. TYSOR    Klebsiella oxytoca NOT DETECTED NOT DETECTED Final   Klebsiella pneumoniae NOT DETECTED NOT DETECTED Final   Proteus species NOT DETECTED NOT DETECTED Final   Serratia marcescens NOT DETECTED NOT DETECTED Final   Carbapenem resistance NOT DETECTED NOT DETECTED Final   Haemophilus influenzae NOT DETECTED NOT DETECTED Final   Neisseria meningitidis NOT DETECTED NOT DETECTED Final   Pseudomonas aeruginosa NOT DETECTED NOT DETECTED Final   Candida  albicans NOT DETECTED NOT DETECTED Final   Candida glabrata NOT DETECTED NOT DETECTED Final   Candida krusei NOT DETECTED NOT DETECTED Final   Candida parapsilosis NOT DETECTED NOT DETECTED Final   Candida tropicalis NOT DETECTED NOT  DETECTED Final    Comment: Performed at [facility], [place] [address], [place], [place] [address]  SARS Coronavirus 2 (CEPHEID- Performed in [place] hospital lab), Hosp Order     Status: None   Collection Time: 07/10/18  3:17 AM  Result Value Ref Range Status   SARS Coronavirus 2 NEGATIVE NEGATIVE Final    Comment: (NOTE) If result is NEGATIVE SARS-CoV-2 target nucleic acids are NOT DETECTED. The SARS-CoV-2 RNA is generally detectable in upper and lower  respiratory specimens during the acute phase of infection. The lowest  concentration of SARS-CoV-2 viral copies this assay can detect is 250  copies / mL. A negative result does not preclude SARS-CoV-2 infection  and should not be used as the sole basis for treatment or other  patient management decisions.  A negative result may occur with  improper specimen collection / handling, submission of specimen other  than nasopharyngeal swab, presence of viral mutation(s) within the  areas targeted by this assay, and inadequate number of viral copies  (<250 copies / mL). A negative result must be combined with clinical  observations, patient history, and epidemiological information. If result is POSITIVE SARS-CoV-2 target nucleic acids are DETECTED. The SARS-CoV-2 RNA is generally detectable in upper and lower  respiratory specimens dur ing the acute phase of infection.  Positive  results are indicative of active infection with SARS-CoV-2.  Clinical  correlation with patient history and other diagnostic information is  necessary to determine patient infection status.  Positive results do  not rule out bacterial infection or co-infection with other viruses. If result is PRESUMPTIVE POSTIVE SARS-CoV-2  nucleic acids MAY BE PRESENT.   A presumptive positive result was obtained on the submitted specimen  and confirmed on repeat testing.  While 2019 novel coronavirus  (SARS-CoV-2) nucleic acids may be present in the submitted sample  additional confirmatory testing may be necessary for epidemiological  and / or clinical management purposes  to differentiate between  SARS-CoV-2 and other Sarbecovirus currently known to infect humans.  If clinically indicated additional testing with an alternate test  methodology [phone]) is advised. The SARS-CoV-2 RNA is generally  detectable in upper and lower respiratory sp ecimens during the acute  phase of infection. The expected result is Negative. Fact Sheet for Patients:  StrictlyIdeas.no Fact Sheet for Healthcare Providers: BankingDealers.co.za This test is not yet approved or cleared by the [place] FDA and has been authorized for detection and/or diagnosis of SARS-CoV-2 by FDA under an Emergency Use Authorization (EUA).  This EUA will remain in effect (meaning this test can be used) for the duration of the COVID-19 declaration under Section 564(b)(1) of the Act, 21 U.S.C. section 360bbb-3(b)(1), unless the authorization is terminated or revoked sooner. Performed at [facility], [place] [address], [place], [place] [address]   Urine culture     Status: Abnormal   Collection Time: 07/10/18  5:52 AM  Result Value Ref Range Status   Specimen Description   Final    URINE, CATHETERIZED Performed at [facility] [name] [place],  [address]    Special Requests   Final    NONE Performed at [facility], [place] [address], [place], [place] [address]    Culture 70,000 COLONIES/mL ESCHERICHIA COLI (A)  Final   Report Status 07/13/2018 FINAL  Final   Organism ID, Bacteria ESCHERICHIA COLI (A)  Final      Susceptibility    Escherichia coli - MIC*  AMPICILLIN >=32 RESISTANT Resistant     CEFAZOLIN >=64 RESISTANT Resistant     CEFTRIAXONE 8 SENSITIVE Sensitive     CIPROFLOXACIN <=0.25 SENSITIVE Sensitive     GENTAMICIN <=1 SENSITIVE Sensitive     IMIPENEM 0.5 SENSITIVE Sensitive     NITROFURANTOIN <=16 SENSITIVE Sensitive     TRIMETH/SULFA <=20 SENSITIVE Sensitive     AMPICILLIN/SULBACTAM >=32 RESISTANT Resistant     PIP/TAZO <=4 SENSITIVE Sensitive     Extended ESBL NEGATIVE Sensitive     * 70,000 COLONIES/mL ESCHERICHIA COLI  Urine culture     Status: Abnormal   Collection Time: 07/10/18  8:54 AM  Result Value Ref Range Status   Specimen Description   Final    URINE, RANDOM Performed at [facility] [address] [place], [place] [address]    Special Requests   Final    NONE Performed at [facility], [place] [address], [place], [place] [address]    Culture (A)  Final    <10,000 COLONIES/mL INSIGNIFICANT GROWTH Performed at [facility] [address], [place], [place] [address]    Report Status 07/11/2018 FINAL  Final  Culture, blood (routine x 2) Call MD if unable to obtain prior to antibiotics being given     Status: None (Preliminary result)   Collection Time: 07/10/18 10:14 AM  Result Value Ref Range Status   Specimen Description   Final    RIGHT ANTECUBITAL Performed at [facility] [address], [place], [place] [address]    Special Requests   Final    BOTTLES DRAWN AEROBIC ONLY Blood Culture results may not be optimal due to an inadequate volume of blood received in culture bottles Performed at [facility] [address] [place], [place] [address]    Culture   Final    NO GROWTH 3 DAYS Performed at [facility], [place] [address] [place], [place] [address]    Report Status PENDING  Incomplete  Culture, blood (routine x 2) Call MD if unable to obtain prior to antibiotics being given      Status: None (Preliminary result)   Collection Time: 07/10/18 10:14 AM  Result Value Ref Range Status   Specimen Description   Final    BLOOD LEFT ARM Performed at [facility], [place] [address], [place], [place] [address]    Special Requests   Final    BOTTLES DRAWN AEROBIC ONLY Blood Culture results may not be optimal due to an inadequate volume of blood received in culture bottles Performed at [facility] [address], [place], [place] [address]    Culture   Final    NO GROWTH 3 DAYS Performed at [facility], [place] [address], [place], [place] [address]    Report Status PENDING  Incomplete  SARS Coronavirus 2 (CEPHEID- Performed in [place] hospital lab), Hosp Order     Status: None   Collection Time: 07/10/18  6:22 PM  Result Value Ref Range Status   SARS Coronavirus 2 NEGATIVE NEGATIVE Final    Comment: (NOTE) If result is NEGATIVE SARS-CoV-2 target nucleic acids are NOT DETECTED. The SARS-CoV-2 RNA is generally detectable in upper and lower  respiratory specimens during the acute phase of infection. The lowest  concentration of SARS-CoV-2 viral copies this assay can detect is 250  copies / mL. A negative result does not preclude SARS-CoV-2 infection  and should not be used  as the sole basis for treatment or other  patient management decisions.  A negative result may occur with  improper specimen collection / handling, submission of specimen other  than nasopharyngeal swab, presence of viral mutation(s) within the  areas targeted by this assay, and inadequate number of viral copies  (<250 copies / mL). A negative result must be combined with clinical  observations, patient history, and epidemiological information. If result is POSITIVE SARS-CoV-2 target nucleic acids are DETECTED. The SARS-CoV-2 RNA is generally detectable in upper and lower  respiratory specimens dur ing the acute phase of infection.  Positive  results are indicative of  active infection with SARS-CoV-2.  Clinical  correlation with patient history and other diagnostic information is  necessary to determine patient infection status.  Positive results do  not rule out bacterial infection or co-infection with other viruses. If result is PRESUMPTIVE POSTIVE SARS-CoV-2 nucleic acids MAY BE PRESENT.   A presumptive positive result was obtained on the submitted specimen  and confirmed on repeat testing.  While 2019 novel coronavirus  (SARS-CoV-2) nucleic acids may be present in the submitted sample  additional confirmatory testing may be necessary for epidemiological  and / or clinical management purposes  to differentiate between  SARS-CoV-2 and other Sarbecovirus currently known to infect humans.  If clinically indicated additional testing with an alternate test  methodology [phone]) is advised. The SARS-CoV-2 RNA is generally  detectable in upper and lower respiratory sp ecimens during the acute  phase of infection. The expected result is Negative. Fact Sheet for Patients:  StrictlyIdeas.no Fact Sheet for Healthcare Providers: BankingDealers.co.za This test is not yet approved or cleared by the [place] FDA and has been authorized for detection and/or diagnosis of SARS-CoV-2 by FDA under an Emergency Use Authorization (EUA).  This EUA will remain in effect (meaning this test can be used) for the duration of the COVID-19 declaration under Section 564(b)(1) of the Act, 21 U.S.C. section 360bbb-3(b)(1), unless the authorization is terminated or revoked sooner. Performed at [facility], [place] [address] [place], [place] [address]   Respiratory Panel by PCR     Status: None   Collection Time: 07/10/18  6:26 PM  Result Value Ref Range Status   Adenovirus NOT DETECTED NOT DETECTED Final   Coronavirus 229E NOT DETECTED NOT DETECTED Final    Comment: (NOTE) The Coronavirus on the  Respiratory Panel, DOES NOT test for the novel  Coronavirus (2019 nCoV)    Coronavirus HKU1 NOT DETECTED NOT DETECTED Final   Coronavirus NL63 NOT DETECTED NOT DETECTED Final   Coronavirus OC43 NOT DETECTED NOT DETECTED Final   Metapneumovirus NOT DETECTED NOT DETECTED Final   Rhinovirus / Enterovirus NOT DETECTED NOT DETECTED Final   Influenza A NOT DETECTED NOT DETECTED Final   Influenza B NOT DETECTED NOT DETECTED Final   Parainfluenza Virus 1 NOT DETECTED NOT DETECTED Final   Parainfluenza Virus 2 NOT DETECTED NOT DETECTED Final   Parainfluenza Virus 3 NOT DETECTED NOT DETECTED Final   Parainfluenza Virus 4 NOT DETECTED NOT DETECTED Final   Respiratory Syncytial Virus NOT DETECTED NOT DETECTED Final   Bordetella pertussis NOT DETECTED NOT DETECTED Final   Chlamydophila pneumoniae NOT DETECTED NOT DETECTED Final   Mycoplasma pneumoniae NOT DETECTED NOT DETECTED Final    Comment: Performed at [facility], [place]. [address], [place], [place] [address]    Please note: You were cared for by a hospitalist during your hospital stay. Once you are discharged,  your primary care physician will handle any further medical issues. Please note that NO REFILLS for any discharge medications will be authorized once you are discharged, as it is imperative that you return to your primary care physician (or establish a relationship with a primary care physician if you do not have one) for your post hospital discharge needs so that they can reassess your need for medications and monitor your lab values.    Time coordinating discharge: 40 minutes  SIGNED:   Shelly Coss, MD  [facility] 07/13/2018, 11:17 AM Pager [phone]  If 7PM-7AM, please contact night-coverage [email or web address] Password [email or web address]

## 2018-07-14 LAB — LEGIONELLA PNEUMOPHILA SEROGP 1 UR AG: L. pneumophila Serogp 1 Ur Ag: NEGATIVE

## 2018-07-15 LAB — CULTURE, BLOOD (ROUTINE X 2)
Culture: NO GROWTH
Culture: NO GROWTH

## 2018-07-18 ENCOUNTER — Other Ambulatory Visit: Payer: Self-pay

## 2018-07-18 DIAGNOSIS — S22078A Other fracture of T9-T10 vertebra, initial encounter for closed fracture: Secondary | ICD-10-CM | POA: Diagnosis not present

## 2018-07-18 DIAGNOSIS — I5031 Acute diastolic (congestive) heart failure: Secondary | ICD-10-CM | POA: Diagnosis not present

## 2018-07-18 DIAGNOSIS — J441 Chronic obstructive pulmonary disease with (acute) exacerbation: Secondary | ICD-10-CM | POA: Diagnosis not present

## 2018-07-18 NOTE — Patient Outreach (Signed)
[facility]) Care Management  07/18/2018  Caitlyn Aguirre [birth year] [id number]    EMMI-GENERAL RED ON EMMI ALERT Day # 1 Date: 07/15/2018 Red Alert Reason: " Scheduled follow up? No"   Outreach attempt #1 to patient.  Spoke with patient. She denies any acute issues or concerns at this time. She voices things going well since returning home. Reviewed and addressed red alert with patient. Patient was discharged on Friday evening and did not get a chance to call PCP office when she got home. She voices that she will be calling MD office this morning to make f/u appt. Her daughter is able to assist with her care needs and take her to her appts. She confirmed that she has all her meds in the home and denies any issues or concerns regarding them. She voices no further RN CM needs or concerns at this time. Advised patient that they would get one more automated EMMI-GENERAL post discharge calls to assess how they are doing following recent hospitalization and will receive a call from a nurse if any of their responses were abnormal. Patient voiced understanding and was appreciative of f/u call.     Plan: RN CM will close case as no further interventions needed at this time.   Enzo Montgomery, RN,BSN,CCM [facility] Telephonic Care Management Coordinator Direct Phone: [phone] Toll Free: [phone] Fax: [phone]

## 2018-07-19 ENCOUNTER — Other Ambulatory Visit: Payer: Self-pay

## 2018-07-19 NOTE — Patient Outreach (Signed)
[facility]) Care Management  07/19/2018  IONIA SCHEY 12/18/1935 [id number]      EMMI-General Discharge RED ON EMMI ALERT Day # 4 Date: 07/18/2018 Red Alert Reason: "Lost interest in things? Yes"     Outreach attempt # 1 to patient.  Spoke with patient. She denies any acute issues or concerns at this time. Reviewed and addressed red alert with patient. She reports that she was half asleep yesterday when the automated call came in and accidentally answered incorrectly. She denies any of those feelings and is pleased to report how well she is doing and feeling. No further RN CM needs or concerns at this time.      Plan: RN CM will close case at this time as no further interventions needed.  Enzo Montgomery, RN,BSN,CCM [facility] Telephonic Care Management Coordinator Direct Phone: [phone] Toll Free: [phone] Fax: [phone]

## 2018-07-30 ENCOUNTER — Other Ambulatory Visit: Payer: Self-pay | Admitting: Student in an Organized Health Care Education/Training Program

## 2018-07-30 DIAGNOSIS — R109 Unspecified abdominal pain: Secondary | ICD-10-CM

## 2018-07-30 DIAGNOSIS — K219 Gastro-esophageal reflux disease without esophagitis: Secondary | ICD-10-CM

## 2018-07-30 DIAGNOSIS — I10 Essential (primary) hypertension: Secondary | ICD-10-CM

## 2018-07-30 DIAGNOSIS — E039 Hypothyroidism, unspecified: Secondary | ICD-10-CM

## 2018-08-01 NOTE — Telephone Encounter (Signed)
Next appt scheduled 7/13 with PCP.

## 2018-08-03 DIAGNOSIS — J449 Chronic obstructive pulmonary disease, unspecified: Secondary | ICD-10-CM | POA: Diagnosis not present

## 2018-08-03 DIAGNOSIS — G4733 Obstructive sleep apnea (adult) (pediatric): Secondary | ICD-10-CM | POA: Diagnosis not present

## 2018-08-15 ENCOUNTER — Other Ambulatory Visit: Payer: Self-pay

## 2018-08-15 ENCOUNTER — Ambulatory Visit (INDEPENDENT_AMBULATORY_CARE_PROVIDER_SITE_OTHER): Payer: Medicare Other | Admitting: Student in an Organized Health Care Education/Training Program

## 2018-08-15 ENCOUNTER — Encounter: Payer: Self-pay | Admitting: Student in an Organized Health Care Education/Training Program

## 2018-08-15 VITALS — BP 120/64 | HR 74 | Temp 99.1°F | Ht 68.0 in | Wt 203.8 lb

## 2018-08-15 DIAGNOSIS — F32A Depression, unspecified: Secondary | ICD-10-CM

## 2018-08-15 DIAGNOSIS — Z87440 Personal history of urinary (tract) infections: Secondary | ICD-10-CM | POA: Diagnosis not present

## 2018-08-15 DIAGNOSIS — F329 Major depressive disorder, single episode, unspecified: Secondary | ICD-10-CM

## 2018-08-15 DIAGNOSIS — E1169 Type 2 diabetes mellitus with other specified complication: Secondary | ICD-10-CM

## 2018-08-15 DIAGNOSIS — Z8619 Personal history of other infectious and parasitic diseases: Secondary | ICD-10-CM

## 2018-08-15 DIAGNOSIS — Z7984 Long term (current) use of oral hypoglycemic drugs: Secondary | ICD-10-CM

## 2018-08-15 DIAGNOSIS — I10 Essential (primary) hypertension: Secondary | ICD-10-CM

## 2018-08-15 DIAGNOSIS — Z79899 Other long term (current) drug therapy: Secondary | ICD-10-CM

## 2018-08-15 DIAGNOSIS — Z7982 Long term (current) use of aspirin: Secondary | ICD-10-CM

## 2018-08-15 MED ORDER — IBUPROFEN 200 MG PO CAPS: 400.0000 mg | ORAL_CAPSULE | Freq: Every day | ORAL | 2 refills | Status: DC | PRN

## 2018-08-15 NOTE — Assessment & Plan Note (Signed)
Doing fairly well through these disturbing times.  PHQ 9 score of 8 today.  Sadly she has a lot of social support.  Good coping mechanism.  Not too isolated.  Plan is to continue with paroxetine 30 mg daily.

## 2018-08-15 NOTE — Assessment & Plan Note (Signed)
Blood pressure is at goal today.  Some signs of orthostatic changes with her symptoms of dizziness on standing.  Orthostatic vitals done here in clinic were fine, systolic went from 090 - 301.  Advised that she use a blood pressure cuff at home to monitor for hypotension on the rare occasion if these symptoms develop.  Otherwise she is doing well on his current blood pressure regimen.  Plan is to continue with amlodipine 5 mg daily, chlorthalidone 25 mg daily, lisinopril 40 mg daily, and carvedilol 25 mg twice daily.  Blood work in June looked okay.  Likely will recheck blood work in 3 months at her follow-up.

## 2018-08-15 NOTE — Patient Instructions (Signed)
Today we talked about your diabetes.  Your blood sugars are well controlled.  Continue with the Metformin.  Continue to eat healthy foods.  We talked about your hospitalization in June for urinary tract infection.  No signs of pneumonia today.  He seems to have recovered well from this.  We talked about your blood pressure.  Well controlled today.  The occasional dizziness you feel after standing may be a transient low blood pressure.  I recommend you purchase a blood pressure cuff from a pharmacy.  If you feel dizzy in the future, please check your blood pressure and let me know if it is low.  We will plan to continue your medications as prescribed.  You do not need any blood work today.  Come back and see me in 3 months and we will recheck your blood sugars that.

## 2018-08-15 NOTE — Assessment & Plan Note (Signed)
Hemoglobin A1c 6.9% 1 month ago.  No episodes of hypoglycemia.  Doing well with lifestyle interventions.  Seemingly no harm from recent prednisone burst.  Plan is to continue with metformin 1000 mg twice daily.  Follow-up in 3 months for recheck A1c.  Continue with aspirin and atorvastatin for secondary prevention of ischemic vascular disease and lisinopril for renal protection.  Foot exam done today was fine.

## 2018-08-15 NOTE — Progress Notes (Signed)
   Assessment and Plan:  See Encounters tab for problem-based medical decision making.   __________________________________________________________  HPI:   83 year old woman here for follow-up of diabetes and hypertension.  She was hospitalized at [facility] in early June for E. coli bacteremia due to urinary tract infection.  Treated with antibiotics and responded well.  Chest x-ray at that time showed equivocal changes for pneumonia.  Her breathing has been stable, no productive cough.  COVID-19 testing was negative.  Also had an echo that hospitalization which showed normal ejection fraction, no significant valvular disease.  She was discharged home, did not need rehab or physical therapy follow-up.  Reports doing well at home.  Manages her own medications.  Daughter helps her with grocery shopping.  She is independent in all her other activities of daily living.  No recent falls.  Does complain of occasional dizziness upon standing.  Happens about once per week.  Has not checked her blood pressure.  Lasts for only 2-3 minutes.  Has not led to any falls.  __________________________________________________________  Problem List: Patient Active Problem List   Diagnosis Date Noted  . High Risk for Falls 07/18/2015    Priority: High  . Depression 01/21/2006    Priority: High  . Essential hypertension 01/21/2006    Priority: High  . Type 2 diabetes mellitus with other specified complication ([place]) 37/85/8850    Priority: High  . Obstructive sleep apnea 12/25/2011    Priority: Medium  . Hypothyroidism 01/21/2006    Priority: Medium  . COPD ([place]) 01/21/2006    Priority: Medium  . Osteoarthritis 01/21/2006    Priority: Medium  . GERD (gastroesophageal reflux disease) 06/16/2015    Priority: Low  . Urinary, incontinence, stress female 05/09/2015    Priority: Low  . Insomnia 05/09/2015    Priority: Low  . Eczema of hand 06/21/2013    Priority: Low  . Preventive measure 02/17/2013    Priority: Low    Medications: Reconciled today in Epic __________________________________________________________  Physical Exam:  Vital Signs: Vitals:   08/15/18 0823  BP: 136/69  Pulse: 73  Temp: 99.1 F (37.3 C)  TempSrc: Oral  SpO2: 95%  Weight: 203 lb 12.8 oz (92.4 kg)  Height: 5\' 8"  (1.727 m)    Gen: Well appearing, NAD Neck: No cervical LAD, No thyromegaly or nodules CV: RRR, 2 out of 6 early systolic murmur at the left upper sternal border, trace bilateral pitting edema Pulm: Normal effort, CTA throughout, no wheezing Ext: Normal joints

## 2018-08-17 DIAGNOSIS — S22078A Other fracture of T9-T10 vertebra, initial encounter for closed fracture: Secondary | ICD-10-CM | POA: Diagnosis not present

## 2018-08-17 DIAGNOSIS — I5031 Acute diastolic (congestive) heart failure: Secondary | ICD-10-CM | POA: Diagnosis not present

## 2018-08-17 DIAGNOSIS — J441 Chronic obstructive pulmonary disease with (acute) exacerbation: Secondary | ICD-10-CM | POA: Diagnosis not present

## 2018-09-03 DIAGNOSIS — G4733 Obstructive sleep apnea (adult) (pediatric): Secondary | ICD-10-CM | POA: Diagnosis not present

## 2018-09-03 DIAGNOSIS — J449 Chronic obstructive pulmonary disease, unspecified: Secondary | ICD-10-CM | POA: Diagnosis not present

## 2018-09-17 DIAGNOSIS — S22078A Other fracture of T9-T10 vertebra, initial encounter for closed fracture: Secondary | ICD-10-CM | POA: Diagnosis not present

## 2018-09-17 DIAGNOSIS — J441 Chronic obstructive pulmonary disease with (acute) exacerbation: Secondary | ICD-10-CM | POA: Diagnosis not present

## 2018-09-17 DIAGNOSIS — I5031 Acute diastolic (congestive) heart failure: Secondary | ICD-10-CM | POA: Diagnosis not present

## 2018-10-04 DIAGNOSIS — J449 Chronic obstructive pulmonary disease, unspecified: Secondary | ICD-10-CM | POA: Diagnosis not present

## 2018-10-04 DIAGNOSIS — G4733 Obstructive sleep apnea (adult) (pediatric): Secondary | ICD-10-CM | POA: Diagnosis not present

## 2018-10-14 ENCOUNTER — Other Ambulatory Visit: Payer: Self-pay | Admitting: Internal Medicine

## 2018-10-18 DIAGNOSIS — S22078A Other fracture of T9-T10 vertebra, initial encounter for closed fracture: Secondary | ICD-10-CM | POA: Diagnosis not present

## 2018-10-18 DIAGNOSIS — I5031 Acute diastolic (congestive) heart failure: Secondary | ICD-10-CM | POA: Diagnosis not present

## 2018-10-18 DIAGNOSIS — J441 Chronic obstructive pulmonary disease with (acute) exacerbation: Secondary | ICD-10-CM | POA: Diagnosis not present

## 2018-11-03 DIAGNOSIS — G4733 Obstructive sleep apnea (adult) (pediatric): Secondary | ICD-10-CM | POA: Diagnosis not present

## 2018-11-03 DIAGNOSIS — J449 Chronic obstructive pulmonary disease, unspecified: Secondary | ICD-10-CM | POA: Diagnosis not present

## 2018-11-17 DIAGNOSIS — I5031 Acute diastolic (congestive) heart failure: Secondary | ICD-10-CM | POA: Diagnosis not present

## 2018-11-17 DIAGNOSIS — S22078A Other fracture of T9-T10 vertebra, initial encounter for closed fracture: Secondary | ICD-10-CM | POA: Diagnosis not present

## 2018-11-17 DIAGNOSIS — J441 Chronic obstructive pulmonary disease with (acute) exacerbation: Secondary | ICD-10-CM | POA: Diagnosis not present

## 2018-11-21 ENCOUNTER — Other Ambulatory Visit: Payer: Self-pay

## 2018-11-21 ENCOUNTER — Encounter: Payer: Self-pay | Admitting: Student in an Organized Health Care Education/Training Program

## 2018-11-21 ENCOUNTER — Ambulatory Visit (INDEPENDENT_AMBULATORY_CARE_PROVIDER_SITE_OTHER): Payer: Medicare Other | Admitting: Student in an Organized Health Care Education/Training Program

## 2018-11-21 VITALS — BP 134/59 | HR 70 | Temp 98.6°F | Ht 68.0 in | Wt 209.7 lb

## 2018-11-21 DIAGNOSIS — R011 Cardiac murmur, unspecified: Secondary | ICD-10-CM

## 2018-11-21 DIAGNOSIS — N183 Chronic kidney disease, stage 3 unspecified: Secondary | ICD-10-CM

## 2018-11-21 DIAGNOSIS — E1169 Type 2 diabetes mellitus with other specified complication: Secondary | ICD-10-CM | POA: Diagnosis not present

## 2018-11-21 DIAGNOSIS — E1122 Type 2 diabetes mellitus with diabetic chronic kidney disease: Secondary | ICD-10-CM

## 2018-11-21 DIAGNOSIS — Z7984 Long term (current) use of oral hypoglycemic drugs: Secondary | ICD-10-CM

## 2018-11-21 DIAGNOSIS — I10 Essential (primary) hypertension: Secondary | ICD-10-CM

## 2018-11-21 DIAGNOSIS — Z79899 Other long term (current) drug therapy: Secondary | ICD-10-CM

## 2018-11-21 DIAGNOSIS — I129 Hypertensive chronic kidney disease with stage 1 through stage 4 chronic kidney disease, or unspecified chronic kidney disease: Secondary | ICD-10-CM | POA: Diagnosis not present

## 2018-11-21 DIAGNOSIS — Z9181 History of falling: Secondary | ICD-10-CM

## 2018-11-21 DIAGNOSIS — L918 Other hypertrophic disorders of the skin: Secondary | ICD-10-CM

## 2018-11-21 LAB — GLUCOSE, CAPILLARY: Glucose-Capillary: 239 mg/dL — ABNORMAL HIGH (ref 70–99)

## 2018-11-21 LAB — POCT GLYCOSYLATED HEMOGLOBIN (HGB A1C): Hemoglobin A1C: 6.8 % — AB (ref 4.0–5.6)

## 2018-11-21 NOTE — Assessment & Plan Note (Signed)
Stable.  No falls since last visit.  Has good support from family.  No centrally acting medications.

## 2018-11-21 NOTE — Progress Notes (Signed)
   Assessment and Plan:  See Encounters tab for problem-based medical decision making.   __________________________________________________________  HPI:   83 year old woman here for follow-up of hypertension and diabetes.  She reports doing well at home and is without complaint today.  Reports good compliance with her medications without adverse side effects.  Only complaint is that she takes too many medications.  She reports good exertional capacity, says she can walk about 1 block and then she feels fatigued and has to rest.  She is accompanied by her daughter today, has family for support.  No recent fevers or chills.  No chest pain or shortness of breath.  She questions about seeing recalls of some of her medications on television commercials.  She is also worried about the flu vaccine causing coronavirus, and has heard some kind of disinformation that Covid is intended to cull the older population.  We spent some time talking about the relevant issues around recent medication recalls and the importance of vaccinations to her wellbeing.  __________________________________________________________  Problem List: Patient Active Problem List   Diagnosis Date Noted  . High Risk for Falls 07/18/2015    Priority: High  . Depression 01/21/2006    Priority: High  . Essential hypertension 01/21/2006    Priority: High  . Type 2 diabetes mellitus with other specified complication ([place]) [id number]    Priority: High  . Obstructive sleep apnea 12/25/2011    Priority: Medium  . Hypothyroidism 01/21/2006    Priority: Medium  . COPD ([place]) 01/21/2006    Priority: Medium  . Osteoarthritis 01/21/2006    Priority: Medium  . GERD (gastroesophageal reflux disease) 06/16/2015    Priority: Low  . Urinary, incontinence, stress female 05/09/2015    Priority: Low  . Insomnia 05/09/2015    Priority: Low  . Eczema of hand 06/21/2013    Priority: Low  . Preventive measure 02/17/2013    Priority: Low     Medications: Reconciled today in Epic __________________________________________________________  Physical Exam:  Vital Signs: Vitals:   11/21/18 1012  BP: (!) 134/59  Pulse: 70  Temp: 98.6 F (37 C)  TempSrc: Oral  SpO2: 97%  Weight: 209 lb 11.2 oz (95.1 kg)  Height: 5\' 8"  (1.727 m)    Gen: Well appearing, NAD Neck: No cervical LAD, No thyromegaly or nodules, No JVD. CV: RRR, 2 out of 6 early systolic murmur at the right upper sternal border with a split S2 Pulm: Normal effort, CTA throughout, no wheezing  Ext: Warm, trace bilateral pitting edema, normal joints Skin: No atypical appearing moles. No rashes, Benign-appearing skin tags at the base of her neck

## 2018-11-21 NOTE — Assessment & Plan Note (Signed)
Hemoglobin A1c under control, 6.8%, at goal.  Plan to continue with Metformin 1000 mg twice daily.  Foot exam today was normal.  Diabetes is complicated by CKD which has been stable.  Too soon to recheck, last checked on 4 months ago.

## 2018-11-21 NOTE — Patient Instructions (Signed)
It is great seeing you today in the clinic.  Your blood pressure is under great control.  We talked about your chronic kidney disease which is stable.  Going to check your blood work in 3 months when I next see you.  We talked about your sleep apnea, you have not had any more falls recently, we talked about your diabetes which is under excellent control.  Call me if you have any problems.  You received a flu shot today.  I want to see you back in the clinic in 3 months.

## 2018-11-21 NOTE — Assessment & Plan Note (Signed)
Blood pressure is at goal.  Plan to continue with amlodipine 5 mg, chlorthalidone 25 mg, lisinopril 40 mg, and carvedilol 25 mg twice daily.  No adverse side effects to the medications.  BMP showed CKD stage III 4 months ago, will check at next visit.

## 2018-12-04 DIAGNOSIS — J449 Chronic obstructive pulmonary disease, unspecified: Secondary | ICD-10-CM | POA: Diagnosis not present

## 2018-12-04 DIAGNOSIS — G4733 Obstructive sleep apnea (adult) (pediatric): Secondary | ICD-10-CM | POA: Diagnosis not present

## 2018-12-18 DIAGNOSIS — J441 Chronic obstructive pulmonary disease with (acute) exacerbation: Secondary | ICD-10-CM | POA: Diagnosis not present

## 2018-12-18 DIAGNOSIS — S22078A Other fracture of T9-T10 vertebra, initial encounter for closed fracture: Secondary | ICD-10-CM | POA: Diagnosis not present

## 2018-12-18 DIAGNOSIS — I5031 Acute diastolic (congestive) heart failure: Secondary | ICD-10-CM | POA: Diagnosis not present

## 2019-01-02 ENCOUNTER — Other Ambulatory Visit: Payer: Self-pay | Admitting: Internal Medicine

## 2019-01-02 ENCOUNTER — Other Ambulatory Visit: Payer: Self-pay | Admitting: Student in an Organized Health Care Education/Training Program

## 2019-01-02 DIAGNOSIS — J438 Other emphysema: Secondary | ICD-10-CM

## 2019-01-03 DIAGNOSIS — J449 Chronic obstructive pulmonary disease, unspecified: Secondary | ICD-10-CM | POA: Diagnosis not present

## 2019-01-03 DIAGNOSIS — G4733 Obstructive sleep apnea (adult) (pediatric): Secondary | ICD-10-CM | POA: Diagnosis not present

## 2019-01-17 DIAGNOSIS — I5031 Acute diastolic (congestive) heart failure: Secondary | ICD-10-CM | POA: Diagnosis not present

## 2019-01-17 DIAGNOSIS — S22078A Other fracture of T9-T10 vertebra, initial encounter for closed fracture: Secondary | ICD-10-CM | POA: Diagnosis not present

## 2019-01-17 DIAGNOSIS — J441 Chronic obstructive pulmonary disease with (acute) exacerbation: Secondary | ICD-10-CM | POA: Diagnosis not present

## 2019-02-03 DIAGNOSIS — J449 Chronic obstructive pulmonary disease, unspecified: Secondary | ICD-10-CM | POA: Diagnosis not present

## 2019-02-03 DIAGNOSIS — G4733 Obstructive sleep apnea (adult) (pediatric): Secondary | ICD-10-CM | POA: Diagnosis not present

## 2019-02-09 ENCOUNTER — Encounter: Payer: Self-pay | Admitting: *Deleted

## 2019-02-14 ENCOUNTER — Other Ambulatory Visit: Payer: Self-pay

## 2019-02-14 ENCOUNTER — Emergency Department (HOSPITAL_COMMUNITY): Payer: Medicare Other

## 2019-02-14 ENCOUNTER — Emergency Department (HOSPITAL_COMMUNITY)
Admission: EM | Admit: 2019-02-14 | Discharge: 2019-02-14 | Disposition: A | Discharge status: Home / Self Care | Payer: Medicare Other | Attending: Emergency Medicine | Admitting: Emergency Medicine

## 2019-02-14 ENCOUNTER — Encounter (HOSPITAL_COMMUNITY): Payer: Self-pay

## 2019-02-14 DIAGNOSIS — Z955 Presence of coronary angioplasty implant and graft: Secondary | ICD-10-CM | POA: Diagnosis not present

## 2019-02-14 DIAGNOSIS — Z7982 Long term (current) use of aspirin: Secondary | ICD-10-CM | POA: Diagnosis not present

## 2019-02-14 DIAGNOSIS — I110 Hypertensive heart disease with heart failure: Secondary | ICD-10-CM | POA: Insufficient documentation

## 2019-02-14 DIAGNOSIS — Z0184 Encounter for antibody response examination: Secondary | ICD-10-CM | POA: Diagnosis not present

## 2019-02-14 DIAGNOSIS — E119 Type 2 diabetes mellitus without complications: Secondary | ICD-10-CM | POA: Insufficient documentation

## 2019-02-14 DIAGNOSIS — J449 Chronic obstructive pulmonary disease, unspecified: Secondary | ICD-10-CM | POA: Insufficient documentation

## 2019-02-14 DIAGNOSIS — I509 Heart failure, unspecified: Secondary | ICD-10-CM | POA: Diagnosis not present

## 2019-02-14 DIAGNOSIS — U071 COVID-19: Secondary | ICD-10-CM | POA: Insufficient documentation

## 2019-02-14 DIAGNOSIS — E039 Hypothyroidism, unspecified: Secondary | ICD-10-CM | POA: Diagnosis not present

## 2019-02-14 DIAGNOSIS — Z7984 Long term (current) use of oral hypoglycemic drugs: Secondary | ICD-10-CM | POA: Insufficient documentation

## 2019-02-14 DIAGNOSIS — Z79899 Other long term (current) drug therapy: Secondary | ICD-10-CM | POA: Insufficient documentation

## 2019-02-14 DIAGNOSIS — I252 Old myocardial infarction: Secondary | ICD-10-CM | POA: Insufficient documentation

## 2019-02-14 DIAGNOSIS — Z87891 Personal history of nicotine dependence: Secondary | ICD-10-CM | POA: Insufficient documentation

## 2019-02-14 DIAGNOSIS — R509 Fever, unspecified: Secondary | ICD-10-CM | POA: Diagnosis not present

## 2019-02-14 DIAGNOSIS — R05 Cough: Secondary | ICD-10-CM | POA: Diagnosis not present

## 2019-02-14 LAB — RESPIRATORY PANEL BY RT PCR (FLU A&B, COVID)
Influenza A by PCR: NEGATIVE
Influenza B by PCR: NEGATIVE
SARS Coronavirus 2 by RT PCR: POSITIVE — AB

## 2019-02-14 LAB — POC SARS CORONAVIRUS 2 AG -  ED: SARS Coronavirus 2 Ag: NEGATIVE

## 2019-02-14 MED ORDER — ACETAMINOPHEN 325 MG PO TABS
650.0000 mg | ORAL_TABLET | Freq: Once | ORAL | Status: AC
  Administered 2019-02-14: 650 mg via ORAL
  Filled 2019-02-14: qty 2

## 2019-02-14 NOTE — ED Triage Notes (Signed)
Pt states that she has a headache, cough. Pt states that her daughter was recently dx with COVID- pt lives with her.

## 2019-02-14 NOTE — Discharge Instructions (Signed)
Make sure you are getting plenty of rest and drinking a lot of fluids, especially water.  For pain or fever, use Tylenol 650 mg every 4 hours.  We have sent a test for COVID-19, which will return within the next couple of hours.  We will call you this evening with the result.  We have attached some directions to followthe Covid test shows that you have the infection.

## 2019-02-14 NOTE — ED Notes (Signed)
Pt refused discharge vital signs
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
## 2019-02-15 ENCOUNTER — Telehealth: Payer: Self-pay | Admitting: Student in an Organized Health Care Education/Training Program

## 2019-02-15 ENCOUNTER — Telehealth: Payer: Self-pay | Admitting: Infectious Diseases

## 2019-02-15 ENCOUNTER — Other Ambulatory Visit: Payer: Self-pay | Admitting: Student in an Organized Health Care Education/Training Program

## 2019-02-15 MED ORDER — GUAIFENESIN-CODEINE 100-10 MG/5ML PO SYRP: 5.0000 mL | ORAL_SOLUTION | Freq: Two times a day (BID) | ORAL | 0 refills | Status: DC | PRN

## 2019-02-15 NOTE — Telephone Encounter (Signed)
Spoke with Dr. Evette Doffing regarding patient's needs for f/u to Covid dx. States patient does not require in-home visits. Referral placed by Dr. Evette Doffing to [facility] for telephonic f/u. Also, states patient does not need to have monoclonal antibody infusion offered by [facility]. Hubbard Hartshorn, BSN, RN-BC

## 2019-02-15 NOTE — Telephone Encounter (Signed)
I spoke with Caitlyn Aguirre about COVID diagnosis. She is doing ok at home, no worsening dyspnea with exertion. Given advanced age and comorbidities she is at increased risk for complication. I advised her to get a pulse oximeter from pharmacy to monitor for new hypoxia. She will continue her supplemental oxygen as needed for dyspnea for now. I prescribed guaifenesin-codeine syrup for supportive care of her cough which she has used before. Gave usual precautions.   Can we make sure she is enrolled in the covid at home monitoring program please?

## 2019-02-15 NOTE — Telephone Encounter (Signed)
Called to discuss with patient about Covid symptoms and the use of bamlanivimab, a monoclonal antibody infusion for those with mild to moderate Covid symptoms and at a high risk of hospitalization.  Pt is qualified for this infusion at the [facility] infusion center due to Age > 22.   She has had symptoms for 2 days now.   She handed the phone to her daughter who hung up the phone. Did not answer on return attempt.

## 2019-02-15 NOTE — Telephone Encounter (Signed)
Pt needs some cough medicine, [phone]   [place], [place]

## 2019-02-16 ENCOUNTER — Encounter: Payer: Self-pay | Admitting: *Deleted

## 2019-02-16 ENCOUNTER — Other Ambulatory Visit: Payer: Self-pay | Admitting: *Deleted

## 2019-02-16 NOTE — Patient Outreach (Addendum)
[facility]) Care Management  02/16/2019  Caitlyn Aguirre 1935-04-05 [id number]   Referral Date: 02/16/2019 Referral Source: MD office Referral Reason: Covid infection Insurance: [employer] attempt #1, successful. Call placed to member, identity verified. This care manager introduced self and stated purpose of call.  [facility] care management services explained.   Social: Lives with daughter, report she is independent with ADL's. Also has other family in the area for support.  Recently seen in the ED for Covid infection, however state she is improving.  Report feeling "pretty good" and using oxygen as needed.  State she had a fever last night but does not have one today.  Does have intermittent cough but report cough medicine is working to relieve that. She will remain in isolation until 14 days after her diagnosis, 1/26. She will continue deep breathing exercises, vitamins, and hot tea as interventions.  Daughter she lives with was also positive.  Conditions:  Per chart, has history of HTN, COPD, GERD, Diabetes (controlled), and hypothyroidism.  Medications: Reviewed with member, denies the need for financial assistance.  State she is able to manage independently using a pill box.  Appointments: Had appointment with PCP on 10/19 prior to diagnosis, follow up scheduled for 4/5 but she will call for a sooner appointment.  She is aware that it has been suggested for her to have an infusion for Covid treatment but state she does not want it. She will notify MD that she does not want infusion.  Since her diabetes is controlled, she does not check her blood sugar. She did say she had a visit from a nurse from [facility] and they are going to provide devices for telemonitoring. She will start checking blood pressure and weights daily once equipment arrives.  Plan: RN CM will follow up with member within the next 2 weeks.  Fall Risk  02/16/2019 11/21/2018 08/15/2018 02/08/2018 12/27/2017   Falls in the past year? 1 1 1 1 1   Number falls in past yr: 0 - 0 0 1  Injury with Fall? 0 - 0 0 0  Comment - - - - -  Risk Factor Category  - - - - -  Risk for fall due to : History of fall(s) - - - -  Follow up Falls prevention discussed - - - -   Depression screen [facility] 2/9 02/16/2019 11/21/2018 08/15/2018 11/29/2017 12/30/2016  Decreased Interest 0 0 0 1 0  Down, Depressed, Hopeless 0 0 1 1 1   PHQ - 2 Score 0 0 1 2 1   Altered sleeping - 0 3 3 -  Tired, decreased energy - 0 2 1 -  Change in appetite - 0 2 1 -  Feeling bad or failure about yourself  - 0 0 2 -  Trouble concentrating - 0 0 0 -  Moving slowly or fidgety/restless - 0 0 1 -  Suicidal thoughts - 0 0 0 -  PHQ-9 Score - 0 8 10 -  Difficult doing work/chores - Not difficult at all Somewhat difficult Not difficult at all -  Some recent data might be hidden   [facility] CM Care Plan Problem One     Most Recent Value  Care Plan Problem One  Risk for hospitalization related to Covid infection  Role Documenting the Problem One  Care Management Coordinator  Care Plan for Problem One  Active  [facility] Long Term Goal   Member will report no hospitalizations or ED visits in the next 31  days   [facility] Long Term Goal Start Date  02/16/19  Interventions for Problem One Long Term Goal  Discharge instructions reviewed with member. Covid precautions and need for quarantine discusssed with member   [facility] CM Short Term Goal #1   Member will report follow up appointment with PCP within the next 2 weeks  [facility] CM Short Term Goal #1 Start Date  02/16/19  Interventions for Short Term Goal #1  Advised member to call to get sooner appointment with PCP   [facility] CM Short Term Goal #2   Member will report compliance with medications over the next 2 weeks  [facility] CM Short Term Goal #2 Start Date  02/16/19  Interventions for Short Term Goal #2  Medications reviewed with member, including use of oxygen and inhalers.     Valente David, [place], MSN [place] [phone]

## 2019-02-17 DIAGNOSIS — J441 Chronic obstructive pulmonary disease with (acute) exacerbation: Secondary | ICD-10-CM | POA: Diagnosis not present

## 2019-02-17 DIAGNOSIS — S22078A Other fracture of T9-T10 vertebra, initial encounter for closed fracture: Secondary | ICD-10-CM | POA: Diagnosis not present

## 2019-02-17 DIAGNOSIS — I5031 Acute diastolic (congestive) heart failure: Secondary | ICD-10-CM | POA: Diagnosis not present

## 2019-02-24 ENCOUNTER — Other Ambulatory Visit: Payer: Self-pay | Admitting: *Deleted

## 2019-02-24 ENCOUNTER — Other Ambulatory Visit: Payer: Self-pay | Admitting: Student in an Organized Health Care Education/Training Program

## 2019-02-24 MED ORDER — GUAIFENESIN-CODEINE 100-10 MG/5ML PO SYRP: 5.0000 mL | ORAL_SOLUTION | Freq: Two times a day (BID) | ORAL | 0 refills | Status: DC | PRN

## 2019-02-24 NOTE — Patient Outreach (Signed)
[facility] Care Management  02/24/2019  Caitlyn Aguirre [birth year] [id number]   Call placed to member to follow up on management of Covid infection.  She report she is improving well, denies any ongoing fever. Does state she has continued to have a cough, especially at night and is requesting more cough medicine. She has not called PCP office to request sooner appointment (next is 4/5), asking this care manager to call office.  This care manager was made aware by CMA that request would be sent to MD.  Inquired about sooner appointment, advised that this was not not needed.    This care manager inquired about supplies from [facility] for telemonitoring, member report she has received box but hadn't opened it yet.  Advised to open and set up date for representative to come help with instructions.  She verbalizes understanding.  She denies any urgent concerns, will follow up within the next month.  [facility] CM Care Plan Problem One     Most Recent Value  Care Plan Problem One  Risk for hospitalization related to Covid infection  Role Documenting the Problem One  Care Management [place] for Problem One  Active  [facility] Long Term Goal   Member will report no hospitalizations or ED visits in the next 31 days   [facility] Long Term Goal Start Date  02/16/19  Interventions for Problem One Long Term Goal  Reviewed isolation deadlines with member advised to wear mask and inquire about vaccination  [facility] CM Short Term Goal #1   Member will report follow up appointment with PCP within the next 2 weeks  [facility] CM Short Term Goal #1 Start Date  02/16/19  [facility] CM Short Term Goal #2   Member will report compliance with medications over the next 2 weeks  [facility] CM Short Term Goal #2 Start Date  02/16/19  [facility] CM Short Term Goal #2 Met Date  02/24/19  [facility] CM Short Term Goal #3  Member will report decrease in cough over the next 2 weks  [facility] CM Short Term Goal #3 Start Date  02/24/19  Interventions for Short  [place] Goal #3  Call placed to MD office to request refill for cough medicine.  educated on deep breathing exercises in effort to clear lungs      Valente David, Therapist, sports, MSN [place] [phone]

## 2019-02-24 NOTE — Telephone Encounter (Signed)
Needs refill on guaiFENesin-codeine (ROBITUSSIN AC) 100-10 MG/5ML syrup  ;pt contact [place], [place]

## 2019-03-06 DIAGNOSIS — G4733 Obstructive sleep apnea (adult) (pediatric): Secondary | ICD-10-CM | POA: Diagnosis not present

## 2019-03-06 DIAGNOSIS — J449 Chronic obstructive pulmonary disease, unspecified: Secondary | ICD-10-CM | POA: Diagnosis not present

## 2019-03-13 ENCOUNTER — Ambulatory Visit: Payer: Medicare Other | Admitting: Student in an Organized Health Care Education/Training Program

## 2019-03-24 ENCOUNTER — Other Ambulatory Visit: Payer: Self-pay | Admitting: *Deleted

## 2019-03-24 NOTE — Patient Outreach (Signed)
[facility] ([facility]) Care Management  03/24/2019  Rosamaria S Michael 04/20/1935 [id number]   Call placed to member to follow up on recovery from Covid infection.  She report she is doing very good.  State she is no longer suffering from a cough and is now off quarantine.  However she still does not have her sense of smell or taste back yet.  Otherwise she feel she is doing "great."  State she have discussed having the vaccination but state she is scared.  Provided her with benefits of being vaccinated, also provided with risks.  State she will think about it and discuss with PCP.    Member inquires about setting up telemonitoring equipment she received from [employer].  State she hasn't tried to set up because she hadn't felt well enough until recently.  Advised to look for contact number on the equipment to call for assistance.  She verbalizes understanding.  Denies any urgent concerns at this time, will follow up within the next month.  If remain stable, will consider transition to health coach.  [facility] CM Care Plan Problem One     Most Recent Value  Care Plan Problem One  Risk for hospitalization related to Covid infection  Role Documenting the Problem One  Care Management Coordinator  Care Plan for Problem One  Active  [facility] Long Term Goal   Member will report no hospitalizations or ED visits in the next 31 days   [facility] Long Term Goal Start Date  02/16/19  [facility] Long Term Goal Met Date  03/24/19  [facility] CM Short Term Goal #1   Member will report follow up appointment with PCP within the next 2 weeks  [facility] CM Short Term Goal #1 Start Date  02/16/19  [facility] CM Short Term Goal #2   Member will report compliance with medications over the next 2 weeks  [facility] CM Short Term Goal #2 Start Date  02/16/19  [facility] CM Short Term Goal #2 Met Date  02/24/19  [facility] CM Short Term Goal #3  Member will report decrease in cough over the next 2 weks  [facility] CM Short Term Goal #3 Start Date  02/24/19  [facility] CM Short Term Goal #3 Met Date   03/24/19     Monica Lane, RN, MSN [facility] Care Management  Community Care Manager [phone]  

## 2019-04-03 DIAGNOSIS — J449 Chronic obstructive pulmonary disease, unspecified: Secondary | ICD-10-CM | POA: Diagnosis not present

## 2019-04-03 DIAGNOSIS — G4733 Obstructive sleep apnea (adult) (pediatric): Secondary | ICD-10-CM | POA: Diagnosis not present

## 2019-04-27 ENCOUNTER — Other Ambulatory Visit: Payer: Self-pay | Admitting: *Deleted

## 2019-04-27 NOTE — Patient Outreach (Signed)
[facility]) Care Management  04/27/2019  Caitlyn Aguirre Jun 06, 1935 [id number]   Call placed to member to follow up on management of chronic medical conditions. She report she is doing well, denies any lingering complications of Covid infection. She has follow up appointment scheduled with PCP on 4/5, state her daughter will provide transportation.  She expresses concern regarding getting the Covid vaccination.  Reassurance provided along with risks and benefits, encouraged to consider and let PCP know during visit.  Advised to ask specific questions regarding interactions with current medications and vaccination.  She verbalizes understanding, agrees to follow up within the next month.  THN CM Care Plan Problem One     Most Recent Value  Care Plan Problem One  Risk for hospitalization related to Covid infection  Role Documenting the Problem One  Care Management [place] for Problem One  Active  THN CM Short Term Goal #1   Member will report follow up appointment with PCP within the next 2 weeks  THN CM Short Term Goal #1 Start Date  04/27/19  Interventions for Short Term Goal #1  Member reminded of upcoming appointment with PCP.  Assessed need for transportation, none needed  THN CM Short Term Goal #2   Member will report decision on Covid vaccination over the next 3 weeks  THN CM Short Term Goal #2 Start Date  04/27/19  Interventions for Short Term Goal #2  Member educated on advantages and disadvantages of vaccination.  Encouraged to consider taking      Valente David, RN, MSN [place] [phone]

## 2019-04-28 ENCOUNTER — Ambulatory Visit: Payer: Medicare Other | Admitting: *Deleted

## 2019-05-01 DIAGNOSIS — H401131 Primary open-angle glaucoma, bilateral, mild stage: Secondary | ICD-10-CM | POA: Diagnosis not present

## 2019-05-01 DIAGNOSIS — E119 Type 2 diabetes mellitus without complications: Secondary | ICD-10-CM | POA: Diagnosis not present

## 2019-05-01 DIAGNOSIS — Z961 Presence of intraocular lens: Secondary | ICD-10-CM | POA: Diagnosis not present

## 2019-05-01 LAB — HM DIABETES EYE EXAM

## 2019-05-02 ENCOUNTER — Encounter: Payer: Self-pay | Admitting: *Deleted

## 2019-05-04 DIAGNOSIS — J449 Chronic obstructive pulmonary disease, unspecified: Secondary | ICD-10-CM | POA: Diagnosis not present

## 2019-05-04 DIAGNOSIS — G4733 Obstructive sleep apnea (adult) (pediatric): Secondary | ICD-10-CM | POA: Diagnosis not present

## 2019-05-05 ENCOUNTER — Other Ambulatory Visit: Payer: Self-pay | Admitting: Internal Medicine

## 2019-05-05 DIAGNOSIS — I10 Essential (primary) hypertension: Secondary | ICD-10-CM

## 2019-05-08 ENCOUNTER — Ambulatory Visit (INDEPENDENT_AMBULATORY_CARE_PROVIDER_SITE_OTHER): Payer: Medicare Other | Admitting: Student in an Organized Health Care Education/Training Program

## 2019-05-08 ENCOUNTER — Encounter: Payer: Self-pay | Admitting: Student in an Organized Health Care Education/Training Program

## 2019-05-08 VITALS — BP 117/61 | HR 71 | Temp 98.6°F | Ht 68.0 in | Wt 211.1 lb

## 2019-05-08 DIAGNOSIS — Z7984 Long term (current) use of oral hypoglycemic drugs: Secondary | ICD-10-CM

## 2019-05-08 DIAGNOSIS — M159 Polyosteoarthritis, unspecified: Secondary | ICD-10-CM

## 2019-05-08 DIAGNOSIS — Z79899 Other long term (current) drug therapy: Secondary | ICD-10-CM

## 2019-05-08 DIAGNOSIS — M8949 Other hypertrophic osteoarthropathy, multiple sites: Secondary | ICD-10-CM

## 2019-05-08 DIAGNOSIS — E1169 Type 2 diabetes mellitus with other specified complication: Secondary | ICD-10-CM | POA: Diagnosis not present

## 2019-05-08 DIAGNOSIS — N1832 Chronic kidney disease, stage 3b: Secondary | ICD-10-CM | POA: Insufficient documentation

## 2019-05-08 DIAGNOSIS — I129 Hypertensive chronic kidney disease with stage 1 through stage 4 chronic kidney disease, or unspecified chronic kidney disease: Secondary | ICD-10-CM

## 2019-05-08 DIAGNOSIS — E1122 Type 2 diabetes mellitus with diabetic chronic kidney disease: Secondary | ICD-10-CM | POA: Diagnosis not present

## 2019-05-08 DIAGNOSIS — Z7982 Long term (current) use of aspirin: Secondary | ICD-10-CM

## 2019-05-08 DIAGNOSIS — I10 Essential (primary) hypertension: Secondary | ICD-10-CM

## 2019-05-08 DIAGNOSIS — E039 Hypothyroidism, unspecified: Secondary | ICD-10-CM

## 2019-05-08 DIAGNOSIS — Z8616 Personal history of COVID-19: Secondary | ICD-10-CM

## 2019-05-08 DIAGNOSIS — Z9181 History of falling: Secondary | ICD-10-CM

## 2019-05-08 DIAGNOSIS — N183 Chronic kidney disease, stage 3 unspecified: Secondary | ICD-10-CM | POA: Insufficient documentation

## 2019-05-08 DIAGNOSIS — G4700 Insomnia, unspecified: Secondary | ICD-10-CM

## 2019-05-08 LAB — POCT GLYCOSYLATED HEMOGLOBIN (HGB A1C): Hemoglobin A1C: 7.1 % — AB (ref 4.0–5.6)

## 2019-05-08 LAB — GLUCOSE, CAPILLARY: Glucose-Capillary: 166 mg/dL — ABNORMAL HIGH (ref 70–99)

## 2019-05-08 NOTE — Assessment & Plan Note (Signed)
Patient reporting diffuse pain in her bilateral shoulders, hips, knees which are becoming function limiting.  She has complained of pain and stiffness before, but this seems to be worse than in the past.  She has some weakness with hip and go.  No active synovitis on exam.  No joint effusions in the shoulders or the knees.  No history of autoimmune disease.  No signs of cervical spine disease.  Given the distribution of the pain now function limiting it is I am wondering if she has polymyalgia rheumatica.  Plan will be to check ESR, CRP today.  We will also rule out myositis with a CK, rule out rheumatoid arthritis with RF and anti-CCP antibodies.  If she has elevated inflammatory markers I may try low-dose prednisone as this pain and stiffness is not impacting her quality of life at this point.  No NSAIDs due to CKD stage IIIb.

## 2019-05-08 NOTE — Assessment & Plan Note (Signed)
Symptomatically stable.  Check TSH today.

## 2019-05-08 NOTE — Assessment & Plan Note (Signed)
Patient with CKD stage IIIb with GFR around 40.  Plan to check BMP and check CBC as she has a history of mild anemia due to chronic renal disease.  I think there is some risk with the amount of NSAIDs that she is using for osteoarthritis pain.  At this point advised to discontinue all NSAIDs.

## 2019-05-08 NOTE — Progress Notes (Signed)
   Assessment and Plan:  See Encounters tab for problem-based medical decision making.   __________________________________________________________  HPI:   84 year old person here for follow-up of diabetes and hypertension.  She was diagnosed with Covid in January and was treated as an outpatient.  Did not require hospitalization.  Doing okay but still has some persistent cough productive of a mild amount of sputum.  Her exertional capacity and dyspnea have resolved, at least returned to baseline.  Acute complaint today is of diffuse pain in her bilateral shoulders radiating down her arms, in her bilateral hips.  Reports significant stiffness.  Reports that the pain is limiting her activities of daily living.  Has been taking Aleve and Tylenol with minimal benefit.  Reports a lot of symptoms at night.  No fevers or chills.  No recent falls.  Denies any other changes in her medications.  Accompanied by daughter today who she lives with.  __________________________________________________________  Problem List: Patient Active Problem List   Diagnosis Date Noted  . High Risk for Falls 07/18/2015    Priority: High  . Depression 01/21/2006    Priority: High  . Essential hypertension 01/21/2006    Priority: High  . Type 2 diabetes mellitus with other specified complication ([place]) 123456    Priority: High  . CKD (chronic kidney disease) stage 3, GFR 30-59 ml/min 05/08/2019    Priority: Medium  . Obstructive sleep apnea 12/25/2011    Priority: Medium  . Hypothyroidism 01/21/2006    Priority: Medium  . COPD ([place]) 01/21/2006    Priority: Medium  . Osteoarthritis 01/21/2006    Priority: Medium  . GERD (gastroesophageal reflux disease) 06/16/2015    Priority: Low  . Urinary, incontinence, stress female 05/09/2015    Priority: Low  . Insomnia 05/09/2015    Priority: Low  . Eczema of hand 06/21/2013    Priority: Low  . Preventive measure 02/17/2013    Priority: Low    Medications:  Reconciled today in Epic __________________________________________________________  Physical Exam:  Vital Signs: Vitals:   05/08/19 1007  BP: 117/61  Pulse: 71  Temp: 98.6 F (37 C)  TempSrc: Oral  SpO2: 94%  Weight: 211 lb 1.6 oz (95.8 kg)  Height: 5\' 8"  (1.727 m)    Gen: Well appearing, NAD Neck: No cervical LAD, No thyromegaly or nodule CV: RRR, no murmurs Pulm: Normal effort, CTA throughout, no wheezing Abd: Soft, NT, ND Ext: Warm, no edema, normal joints, very mild impingement in shoulders bilaterally, no effusions in the shoulders or the knee, some [place] nodes in her hands but no synovitis of the small joints.

## 2019-05-08 NOTE — Assessment & Plan Note (Signed)
Blood pressure well controlled today.  Plan to continue with amlodipine 5, carvedilol 25 mg twice daily, chlorthalidone 25 mg daily, and lisinopril 40 mg daily.  Check BMP today.

## 2019-05-08 NOTE — Assessment & Plan Note (Signed)
Hemoglobin A1c is well controlled at 7.1%.  Plan to continue therapy with Metformin 1000 mg twice daily.  Continue with aspirin and atorvastatin for primary prevention of ischemic vascular disease.

## 2019-05-08 NOTE — Assessment & Plan Note (Signed)
Patient at risk for falls.  Suffers from insomnia and has been using Advil PM.  I advised against using this, talked about diphenhydramine and its impact on fall risk.  I offered physical therapy for gait training which she declined.

## 2019-05-09 ENCOUNTER — Telehealth: Payer: Self-pay | Admitting: Student in an Organized Health Care Education/Training Program

## 2019-05-09 DIAGNOSIS — M353 Polymyalgia rheumatica: Secondary | ICD-10-CM

## 2019-05-09 MED ORDER — PREDNISONE 20 MG PO TABS: 20.0000 mg | ORAL_TABLET | Freq: Every day | ORAL | 1 refills | Status: DC

## 2019-05-09 NOTE — Telephone Encounter (Signed)
Based on the elevated ESR and her typical symptoms of bilateral shoulder pain and stiffness I am suspicious for polymyalgia rheumatica.  No symptoms to suggest GCA at this time.  Normal CRP goes against this diagnosis, but this test has been elevated in the past.  On balance I think it is worth a trial of low-dose prednisone to see if it resolves her symptoms.  Her bilateral shoulder pain really is function limiting and inhibiting her quality of life at this point.  I discussed this with the patient and she agrees.  We will follow up with a telephone visit in 2 to 4 weeks.  If she has no improvement with the prednisone, we will discontinue it.

## 2019-05-10 LAB — BMP8+ANION GAP
Anion Gap: 17 mmol/L (ref 10.0–18.0)
BUN/Creatinine Ratio: 17 (ref 12–28)
BUN: 22 mg/dL (ref 8–27)
CO2: 17 mmol/L — ABNORMAL LOW (ref 20–29)
Calcium: 10.2 mg/dL (ref 8.7–10.3)
Chloride: 104 mmol/L (ref 96–106)
Creatinine, Ser: 1.3 mg/dL — ABNORMAL HIGH (ref 0.57–1.00)
GFR calc Af Amer: 44 mL/min/{1.73_m2} — ABNORMAL LOW (ref >59)
GFR calc non Af Amer: 39 mL/min/{1.73_m2} — ABNORMAL LOW (ref >59)
Glucose: 162 mg/dL — ABNORMAL HIGH (ref 65–99)
Potassium: 4.7 mmol/L (ref 3.5–5.2)
Sodium: 138 mmol/L (ref 134–144)

## 2019-05-10 LAB — CBC
Hematocrit: 33.2 % — ABNORMAL LOW (ref 34.0–46.6)
Hemoglobin: 11.2 g/dL (ref 11.1–15.9)
MCH: 29.6 pg (ref 26.6–33.0)
MCHC: 33.7 g/dL (ref 31.5–35.7)
MCV: 88 fL (ref 79–97)
Platelets: 206 10*3/uL (ref 150–450)
RBC: 3.79 x10E6/uL (ref 3.77–5.28)
RDW: 15.4 % (ref 11.7–15.4)
WBC: 6.9 10*3/uL (ref 3.4–10.8)

## 2019-05-10 LAB — VITAMIN D 25 HYDROXY (VIT D DEFICIENCY, FRACTURES): Vit D, 25-Hydroxy: 26.8 ng/mL — ABNORMAL LOW (ref 30.0–100.0)

## 2019-05-10 LAB — TSH: TSH: 1.33 u[IU]/mL (ref 0.450–4.500)

## 2019-05-10 LAB — CK: Total CK: 59 U/L (ref 26–161)

## 2019-05-10 LAB — SEDIMENTATION RATE: Sed Rate: 64 mm/hr — ABNORMAL HIGH (ref 0–40)

## 2019-05-10 LAB — C-REACTIVE PROTEIN: CRP: <1 mg/L (ref 0–10)

## 2019-05-10 LAB — RHEUMATOID FACTOR: Rheumatoid fact SerPl-aCnc: <10 IU/mL (ref 0.0–13.9)

## 2019-05-10 LAB — CYCLIC CITRUL PEPTIDE ANTIBODY, IGG/IGA: Cyclic Citrullin Peptide Ab: 5 units (ref 0–19)

## 2019-05-10 NOTE — Telephone Encounter (Signed)
The patient has been added for 06/21/2019 and notified.

## 2019-05-10 NOTE — Telephone Encounter (Signed)
April 19th is already booked .  Please advise if you would like to [facility] your clinic for the 2 week f/u appointment requested.

## 2019-05-10 NOTE — Telephone Encounter (Signed)
Yes, overbook is ok. Thanks!

## 2019-05-22 ENCOUNTER — Other Ambulatory Visit: Payer: Self-pay

## 2019-05-22 ENCOUNTER — Ambulatory Visit (INDEPENDENT_AMBULATORY_CARE_PROVIDER_SITE_OTHER): Payer: Medicare Other | Admitting: Student in an Organized Health Care Education/Training Program

## 2019-05-22 ENCOUNTER — Encounter: Payer: Self-pay | Admitting: Student in an Organized Health Care Education/Training Program

## 2019-05-22 DIAGNOSIS — G4700 Insomnia, unspecified: Secondary | ICD-10-CM

## 2019-05-22 DIAGNOSIS — M353 Polymyalgia rheumatica: Secondary | ICD-10-CM | POA: Diagnosis not present

## 2019-05-22 MED ORDER — RAMELTEON 8 MG PO TABS: 8.0000 mg | ORAL_TABLET | Freq: Every day | ORAL | 2 refills | Status: DC

## 2019-05-22 NOTE — Assessment & Plan Note (Signed)
Patient with consistent history of pain and stiffness in shoulders and hips.  She had elevated inflammatory markers.  We started prednisone 20 mg and she is feeling much better.  Pain and stiffness much improved.  Good benefit to her functional status.  She is having some moderate side effects of the prednisone including insomnia and increased appetite.  We talked about conservative management of those side effects.  She still feels the benefit is worth these side effects.  Plan to continue prednisone for another 6 weeks and then we can start slowly titrating down the dose.
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
## 2019-05-22 NOTE — Assessment & Plan Note (Signed)
Chronic insomnia but recently worsened by starting prednisone for polymyalgia rheumatica.  We talked about lifestyle modifications.  Given advanced age she is too high risk for sedatives or anticholinergics.  Plan is to try ramelteon 8 mg nightly.  I set reasonable expectations the efficacy of this medicine.

## 2019-05-25 ENCOUNTER — Other Ambulatory Visit: Payer: Self-pay | Admitting: *Deleted

## 2019-05-25 NOTE — Patient Outreach (Signed)
[facility]) Care Management  05/25/2019  PYPER OLEXA Sep 19, 1935 [id number]   Call placed to member to follow up management of medical conditions.  She report she has been doing much better over the last couple weeks since seeing PCP (was seen on 4/5).  State she has been sleeping better the last few days since taking Ramelteon, pain is decreased since taking Prednisone.  She has follow up appointment scheduled with on 6/7.  Noted that [facility] Clinic has [facility] care manager embedded in practice and able to accept patient.  Member advised of transition and agreeable to ongoing services through the clinic.  Denies any urgent concerns at this time.  Will close case to [facility] community case manager.  THN CM Care Plan Problem One     Most Recent Value  Care Plan Problem One  Risk for hospitalization related to Covid infection  Role Documenting the Problem One  Care Management [place] for Problem One  Active  THN CM Short Term Goal #1   Member will report follow up appointment with PCP within the next 2 weeks  THN CM Short Term Goal #1 Start Date  04/27/19  [facility] CM Short Term Goal #1 Met Date  05/25/19  THN CM Short Term Goal #2   Member will report decision on Covid vaccination over the next 3 weeks  THN CM Short Term Goal #2 Start Date  04/27/19  [facility] CM Short Term Goal #2 Met Date  05/25/19     Valente David, RN, MSN [place] [phone]

## 2019-06-03 DIAGNOSIS — J449 Chronic obstructive pulmonary disease, unspecified: Secondary | ICD-10-CM | POA: Diagnosis not present

## 2019-06-03 DIAGNOSIS — G4733 Obstructive sleep apnea (adult) (pediatric): Secondary | ICD-10-CM | POA: Diagnosis not present

## 2019-06-09 ENCOUNTER — Other Ambulatory Visit: Payer: Self-pay | Admitting: Student in an Organized Health Care Education/Training Program

## 2019-06-09 NOTE — Telephone Encounter (Signed)
REFILL REQUEST  ramelteon (ROZEREM) 8 MG tablet  predniSONE (DELTASONE) 20 MG tablet   [facility] [id number] - [place], [address] AT [address] [phone] (Phone) [phone] (Fax)

## 2019-06-11 ENCOUNTER — Other Ambulatory Visit: Payer: Self-pay

## 2019-06-11 ENCOUNTER — Emergency Department (HOSPITAL_COMMUNITY): Payer: Medicare Other

## 2019-06-11 ENCOUNTER — Emergency Department (HOSPITAL_COMMUNITY)
Admission: EM | Admit: 2019-06-11 | Discharge: 2019-06-11 | Disposition: A | Discharge status: Home / Self Care | Payer: Medicare Other | Attending: Emergency Medicine | Admitting: Emergency Medicine

## 2019-06-11 DIAGNOSIS — R1010 Upper abdominal pain, unspecified: Secondary | ICD-10-CM | POA: Insufficient documentation

## 2019-06-11 DIAGNOSIS — R0789 Other chest pain: Secondary | ICD-10-CM | POA: Diagnosis not present

## 2019-06-11 DIAGNOSIS — N183 Chronic kidney disease, stage 3 unspecified: Secondary | ICD-10-CM | POA: Insufficient documentation

## 2019-06-11 DIAGNOSIS — Z79899 Other long term (current) drug therapy: Secondary | ICD-10-CM | POA: Insufficient documentation

## 2019-06-11 DIAGNOSIS — I130 Hypertensive heart and chronic kidney disease with heart failure and stage 1 through stage 4 chronic kidney disease, or unspecified chronic kidney disease: Secondary | ICD-10-CM | POA: Diagnosis not present

## 2019-06-11 DIAGNOSIS — Z7984 Long term (current) use of oral hypoglycemic drugs: Secondary | ICD-10-CM | POA: Diagnosis not present

## 2019-06-11 DIAGNOSIS — N390 Urinary tract infection, site not specified: Secondary | ICD-10-CM | POA: Insufficient documentation

## 2019-06-11 DIAGNOSIS — J449 Chronic obstructive pulmonary disease, unspecified: Secondary | ICD-10-CM | POA: Diagnosis not present

## 2019-06-11 DIAGNOSIS — E1122 Type 2 diabetes mellitus with diabetic chronic kidney disease: Secondary | ICD-10-CM | POA: Insufficient documentation

## 2019-06-11 DIAGNOSIS — R0602 Shortness of breath: Secondary | ICD-10-CM | POA: Insufficient documentation

## 2019-06-11 DIAGNOSIS — M545 Low back pain: Secondary | ICD-10-CM | POA: Diagnosis present

## 2019-06-11 DIAGNOSIS — R079 Chest pain, unspecified: Secondary | ICD-10-CM | POA: Diagnosis not present

## 2019-06-11 DIAGNOSIS — R110 Nausea: Secondary | ICD-10-CM | POA: Diagnosis not present

## 2019-06-11 DIAGNOSIS — I509 Heart failure, unspecified: Secondary | ICD-10-CM | POA: Insufficient documentation

## 2019-06-11 LAB — COMPREHENSIVE METABOLIC PANEL
ALT: 17 U/L (ref 0–44)
AST: 20 U/L (ref 15–41)
Albumin: 3.7 g/dL (ref 3.5–5.0)
Alkaline Phosphatase: 94 U/L (ref 38–126)
Anion gap: 9 (ref 5–15)
BUN: 27 mg/dL — ABNORMAL HIGH (ref 8–23)
CO2: 22 mmol/L (ref 22–32)
Calcium: 9.9 mg/dL (ref 8.9–10.3)
Chloride: 104 mmol/L (ref 98–111)
Creatinine, Ser: 1.35 mg/dL — ABNORMAL HIGH (ref 0.44–1.00)
GFR calc Af Amer: 42 mL/min — ABNORMAL LOW (ref >60)
GFR calc non Af Amer: 36 mL/min — ABNORMAL LOW (ref >60)
Glucose, Bld: 272 mg/dL — ABNORMAL HIGH (ref 70–99)
Potassium: 4.7 mmol/L (ref 3.5–5.1)
Sodium: 135 mmol/L (ref 135–145)
Total Bilirubin: 1.2 mg/dL (ref 0.3–1.2)
Total Protein: 7 g/dL (ref 6.5–8.1)

## 2019-06-11 LAB — CBC WITH DIFFERENTIAL/PLATELET
Abs Immature Granulocytes: 0.04 10*3/uL (ref 0.00–0.07)
Basophils Absolute: 0 10*3/uL (ref 0.0–0.1)
Basophils Relative: 0 %
Eosinophils Absolute: 0.2 10*3/uL (ref 0.0–0.5)
Eosinophils Relative: 2 %
HCT: 34.5 % — ABNORMAL LOW (ref 36.0–46.0)
Hemoglobin: 11.3 g/dL — ABNORMAL LOW (ref 12.0–15.0)
Immature Granulocytes: 0 %
Lymphocytes Relative: 13 %
Lymphs Abs: 1.4 10*3/uL (ref 0.7–4.0)
MCH: 28.5 pg (ref 26.0–34.0)
MCHC: 32.8 g/dL (ref 30.0–36.0)
MCV: 87.1 fL (ref 80.0–100.0)
Monocytes Absolute: 1 10*3/uL (ref 0.1–1.0)
Monocytes Relative: 9 %
Neutro Abs: 7.9 10*3/uL — ABNORMAL HIGH (ref 1.7–7.7)
Neutrophils Relative %: 76 %
Platelets: 187 10*3/uL (ref 150–400)
RBC: 3.96 MIL/uL (ref 3.87–5.11)
RDW: 15.8 % — ABNORMAL HIGH (ref 11.5–15.5)
WBC: 10.6 10*3/uL — ABNORMAL HIGH (ref 4.0–10.5)
nRBC: 0 % (ref 0.0–0.2)

## 2019-06-11 LAB — URINALYSIS, ROUTINE W REFLEX MICROSCOPIC
Bilirubin Urine: NEGATIVE
Glucose, UA: NEGATIVE mg/dL
Hgb urine dipstick: NEGATIVE
Ketones, ur: 5 mg/dL — AB
Nitrite: NEGATIVE
Protein, ur: NEGATIVE mg/dL
Specific Gravity, Urine: 1.016 (ref 1.005–1.030)
WBC, UA: >50 WBC/hpf — ABNORMAL HIGH (ref 0–5)
pH: 5 (ref 5.0–8.0)

## 2019-06-11 LAB — TROPONIN I (HIGH SENSITIVITY): Troponin I (High Sensitivity): 4 ng/L (ref <18)

## 2019-06-11 LAB — BRAIN NATRIURETIC PEPTIDE: B Natriuretic Peptide: 19.1 pg/mL (ref 0.0–100.0)

## 2019-06-11 MED ORDER — CEPHALEXIN 500 MG PO CAPS: 500.0000 mg | ORAL_CAPSULE | Freq: Two times a day (BID) | ORAL | 0 refills | Status: DC

## 2019-06-11 MED ORDER — PREDNISONE 20 MG PO TABS: 20.0000 mg | ORAL_TABLET | Freq: Every day | ORAL | 0 refills | Status: DC

## 2019-06-11 MED ORDER — PREDNISONE 20 MG PO TABS
20.0000 mg | ORAL_TABLET | Freq: Once | ORAL | Status: AC
  Administered 2019-06-11: 20 mg via ORAL
  Filled 2019-06-11: qty 1

## 2019-06-11 NOTE — Discharge Instructions (Signed)
Take antibiotics as prescribed.  Take the entire course, even if your symptoms improve. Take prednisone as prescribed, starting tomorrow. Follow-up with your primary care doctor for further evaluation and management of your prednisone dosing. Continue taking all other home medications as prescribed. Make sure you are staying well-hydrated water. Return to the emergency room if you develop fevers, persistent vomiting, severe worsening pain, weakness, confusion, or any new, worsening, or concerning symptoms.
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
## 2019-06-11 NOTE — ED Triage Notes (Signed)
Per patient, she has headache, shobr, and back pain. Doesn't remember exactly when it started. Patient states she has arthritis and ran out of her steroid the other day and the back pain has started since then. Patient denies injury. Patient also states her stomach just doesn't feel good. Patient reports having covid in January

## 2019-06-12 MED ORDER — PREDNISONE 20 MG PO TABS: 20.0000 mg | ORAL_TABLET | Freq: Every day | ORAL | 2 refills | Status: DC

## 2019-06-12 NOTE — Telephone Encounter (Signed)
I'm not sure what the ultimate diagnosis was from that ED visit. Seemed like some vague complaints with no clear cause found on the work up. The patient should continue to take Prednisone 20mg  daily for PMR. I will make sure she has refills. We should have her follow up in the [facility] to evaluate any remaining symptoms.

## 2019-06-12 NOTE — Telephone Encounter (Signed)
Pt was given 3 prednisone in ED 5/9, she would like more or something that makes her feel as good as when she takes prednisone. Please advise Refills of rozerem at pharmacy, she can pick up tomorrow

## 2019-06-13 ENCOUNTER — Ambulatory Visit: Payer: Self-pay | Admitting: *Deleted

## 2019-06-13 DIAGNOSIS — J438 Other emphysema: Secondary | ICD-10-CM

## 2019-06-13 DIAGNOSIS — I10 Essential (primary) hypertension: Secondary | ICD-10-CM

## 2019-06-13 DIAGNOSIS — E1169 Type 2 diabetes mellitus with other specified complication: Secondary | ICD-10-CM

## 2019-06-13 LAB — URINE CULTURE: Culture: >=100000 — AB

## 2019-06-13 NOTE — Chronic Care Management (AMB) (Signed)
  Chronic Care Management   Note  06/13/2019 Name: Caitlyn Aguirre MRN: [id number] DOB: 1935/12/08   Successful outreach to patient to arrange initial telephone assessment and to enroll patient in the [facility] Management  Program . Received referral from [facility] on 05/25/19.  Follow up plan: Telephone follow up appointment with care management team member scheduled for:06/20/19 at 2:30 pm  Kelli Churn RN, CCM, [facility] RN Care Manager [phone]

## 2019-06-14 ENCOUNTER — Telehealth: Payer: Self-pay | Admitting: *Deleted

## 2019-06-14 NOTE — Telephone Encounter (Signed)
Post ED Visit - Positive Culture Follow-up  Culture report reviewed by antimicrobial stewardship pharmacist: [facility] Team []  Elenor Quinones, Pharm.D. []  Heide Guile, Pharm.D., BCPS AQ-ID []  Parks Neptune, Pharm.D., BCPS []  Alycia Rossetti, Pharm.D., BCPS []  [place], [place].D., BCPS, AAHIVP []  Legrand Como, Pharm.D., BCPS, AAHIVP []  Salome Arnt, PharmD, BCPS []  Johnnette Gourd, PharmD, BCPS []  Hughes Better, PharmD, BCPS []  Leeroy Cha, PharmD []  Laqueta Linden, PharmD, BCPS []  Albertina Parr, PharmD  [place] Team []  Leodis Sias, PharmD []  Lindell Spar, PharmD []  Royetta Asal, PharmD []  Graylin Shiver, Rph []  Rema Fendt) Glennon Mac, PharmD []  Arlyn Dunning, PharmD []  [name], PharmD []  Dia Sitter, PharmD []  Leone Haven, PharmD []  Gretta Arab, PharmD []  Theodis Shove, PharmD []  Peggyann Juba, PharmD []  Reuel Boom, PharmD   Positive urine culture, reviewed by Theodis Shove, PharmD Treated with Cephalexin, organism sensitive to the same and no further patient follow-up is required at this time.  Harlon Flor [facility] 06/14/2019, 9:52 AM

## 2019-06-15 ENCOUNTER — Other Ambulatory Visit: Payer: Self-pay

## 2019-06-15 ENCOUNTER — Ambulatory Visit (INDEPENDENT_AMBULATORY_CARE_PROVIDER_SITE_OTHER): Payer: Medicare Other | Admitting: Internal Medicine

## 2019-06-15 ENCOUNTER — Encounter: Payer: Self-pay | Admitting: Internal Medicine

## 2019-06-15 VITALS — BP 107/68 | HR 64 | Temp 98.2°F | Ht 68.0 in | Wt 211.3 lb

## 2019-06-15 DIAGNOSIS — B9689 Other specified bacterial agents as the cause of diseases classified elsewhere: Secondary | ICD-10-CM

## 2019-06-15 DIAGNOSIS — B961 Klebsiella pneumoniae [K. pneumoniae] as the cause of diseases classified elsewhere: Secondary | ICD-10-CM

## 2019-06-15 DIAGNOSIS — N390 Urinary tract infection, site not specified: Secondary | ICD-10-CM | POA: Diagnosis not present

## 2019-06-15 DIAGNOSIS — E1169 Type 2 diabetes mellitus with other specified complication: Secondary | ICD-10-CM

## 2019-06-15 DIAGNOSIS — N393 Stress incontinence (female) (male): Secondary | ICD-10-CM

## 2019-06-15 DIAGNOSIS — M353 Polymyalgia rheumatica: Secondary | ICD-10-CM

## 2019-06-15 MED ORDER — CONTOUR NEXT TEST VI STRP: ORAL_STRIP | 2 refills | Status: DC

## 2019-06-15 MED ORDER — CONTOUR NEXT MONITOR W/DEVICE KIT: 1.0000 | PACK | Freq: Every day | 0 refills | Status: DC

## 2019-06-15 MED ORDER — LANCETS MISC: 1.0000 [IU] | Freq: Every day | 3 refills | Status: DC

## 2019-06-15 NOTE — Assessment & Plan Note (Signed)
Caitlyn Aguirre is an 84 yo F w/ PMH of polymyalgia rheumatica, depression, T2DM, urinary incontinence, CKD3, COPD, OSA, GERD and HTn presenting to [facility] after an ED visit. She states that she was in her usual state of health until last Friday when she stopped taking her daily prednisone after running out of her medications. She gets her prescriptions delivered via mail and she was waiting delivery which was expected to arrive on Monday. She developed significant diffuse mylagis, abdominal pain and malaise. She also endorsed dysuria and frequency. She attempted to manage her symptoms at home unsuccessfully and went to the ED. She was seen in the ED and was diagnosed with UTI and started on cephalexin. She also received refill of her prednisone and her symptoms resolved very quickly. She follows up at [facility] per instruction by ED provider.  A/P Presents for f/u after ED visit for acute cystitis. S/p 4 days of cephalexin. Currently sx free. Urine culture grew Klebsiella. Timing of sxs appear to be more consistent with PMR flare but already finished antimicrobial therapy. No need for prolonged duration as she appears well without symptoms.  - Resolved

## 2019-06-15 NOTE — Assessment & Plan Note (Signed)
Presents w/ complaint of continued urinary incontinence. States that 'her friends' get incontinence supplies covered by insurance and request re-assessment for coverage. Paper script for incontinence supplies provided.

## 2019-06-15 NOTE — Assessment & Plan Note (Signed)
Presents for follow up managed of her PMR. Currently on 20mg  prednisone. Had flare over the weekend due to sudden cessation in prednisone therapy due to difficulty with receiving timely refills from her mail-order pharmacy. Discussed regarding importance of daily adherence and possibility of adrenal insufficiency and/or PMR flare with sudden cessation. Ms.Jr and her daughter expressed understanding.  - C/w Prednisone 20mg  daily - Refills sent to local pharmacy by PCP

## 2019-06-15 NOTE — Patient Instructions (Addendum)
Thank you for allowing [place] to provide your care today. Today we discussed your prednisone     I have ordered no labs for you. I will call if any are abnormal.    Today we made no changes to your medications.    Please check your blood sugar every day Please take your prednisone 20mg  daily as prescribed  Please follow-up in 1 month.    Should you have any questions or concerns please call the internal medicine clinic at [phone].     Blood Glucose Monitoring, Adult Monitoring your blood sugar (glucose) is an important part of managing your diabetes (diabetes mellitus). Blood glucose monitoring involves checking your blood glucose as often as directed and keeping a record (log) of your results over time. Checking your blood glucose regularly and keeping a blood glucose log can:  Help you and your health care provider adjust your diabetes management plan as needed, including your medicines or insulin.  Help you understand how food, exercise, illnesses, and medicines affect your blood glucose.  Let you know what your blood glucose is at any time. You can quickly find out if you have low blood glucose (hypoglycemia) or high blood glucose (hyperglycemia). Your health care provider will set individualized treatment goals for you. Your goals will be based on your age, other medical conditions you have, and how you respond to diabetes treatment. Generally, the goal of treatment is to maintain the following blood glucose levels:  Before meals (preprandial): 80-130 mg/dL (4.4-7.2 mmol/L).  After meals (postprandial): below 180 mg/dL (10 mmol/L).  A1c level: less than 7%. Supplies needed:  Blood glucose meter.  Test strips for your meter. Each meter has its own strips. You must use the strips that came with your meter.  A needle to prick your finger (lancet). Do not use a lancet more than one time.  A device that holds the lancet (lancing device).  A journal or log book to write down  your results. How to check your blood glucose  1. Wash your hands with soap and water. 2. Prick the side of your finger (not the tip) with the lancet. Use a different finger each time. 3. Gently rub the finger until a small drop of blood appears. 4. Follow instructions that come with your meter for inserting the test strip, applying blood to the strip, and using your blood glucose meter. 5. Write down your result and any notes. Some meters allow you to use areas of your body other than your finger (alternative sites) to test your blood. The most common alternative sites are:  Forearm.  Thigh.  Palm of the hand. If you think you may have hypoglycemia, or if you have a history of not knowing when your blood glucose is getting low (hypoglycemia unawareness), do not use alternative sites. Use your finger instead. Alternative sites may not be as accurate as the fingers, because blood flow is slower in these areas. This means that the result you get may be delayed, and it may be different from the result that you would get from your finger. Follow these instructions at home: Blood glucose log   Every time you check your blood glucose, write down your result. Also write down any notes about things that may be affecting your blood glucose, such as your diet and exercise for the day. This information can help you and your health care provider: ? Look for patterns in your blood glucose over time. ? Adjust your diabetes management plan as  needed.  Check if your meter allows you to download your records to a computer. Most glucose meters store a record of glucose readings in the meter. If you have type 1 diabetes:  Check your blood glucose 2 or more times a day.  Also check your blood glucose: ? Before every insulin injection. ? Before and after exercise. ? Before meals. ? 2 hours after a meal. ? Occasionally between 2:00 a.m. and 3:00 a.m., as directed. ? Before potentially dangerous tasks,  like driving or using heavy machinery. ? At bedtime.  You may need to check your blood glucose more often, up to 6-10 times a day, if you: ? Use an insulin pump. ? Need multiple daily injections (MDI). ? Have diabetes that is not well-controlled. ? Are ill. ? Have a history of severe hypoglycemia. ? Have hypoglycemia unawareness. If you have type 2 diabetes:  If you take insulin or other diabetes medicines, check your blood glucose 2 or more times a day.  If you are on intensive insulin therapy, check your blood glucose 4 or more times a day. Occasionally, you may also need to check between 2:00 a.m. and 3:00 a.m., as directed.  Also check your blood glucose: ? Before and after exercise. ? Before potentially dangerous tasks, like driving or using heavy machinery.  You may need to check your blood glucose more often if: ? Your medicine is being adjusted. ? Your diabetes is not well-controlled. ? You are ill. General tips  Always keep your supplies with you.  If you have questions or need help, all blood glucose meters have a 24-hour "hotline" phone number that you can call. You may also contact your health care provider.  After you use a few boxes of test strips, adjust (calibrate) your blood glucose meter by following instructions that came with your meter. Contact a health care provider if:  Your blood glucose is at or above 240 mg/dL (13.3 mmol/L) for 2 days in a row.  You have been sick or have had a fever for 2 days or longer, and you are not getting better.  You have any of the following problems for more than 6 hours: ? You cannot eat or drink. ? You have nausea or vomiting. ? You have diarrhea. Get help right away if:  Your blood glucose is lower than 54 mg/dL (3 mmol/L).  You become confused or you have trouble thinking clearly.  You have difficulty breathing.  You have moderate or large ketone levels in your urine. Summary  Monitoring your blood sugar  (glucose) is an important part of managing your diabetes (diabetes mellitus).  Blood glucose monitoring involves checking your blood glucose as often as directed and keeping a record (log) of your results over time.  Your health care provider will set individualized treatment goals for you. Your goals will be based on your age, other medical conditions you have, and how you respond to diabetes treatment.  Every time you check your blood glucose, write down your result. Also write down any notes about things that may be affecting your blood glucose, such as your diet and exercise for the day. This information is not intended to replace advice given to you by your health care provider. Make sure you discuss any questions you have with your health care provider. Document Revised: 11/12/2017 Document Reviewed: 07/01/2015 Elsevier Patient Education  2020 [employer].
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
## 2019-06-16 NOTE — Progress Notes (Signed)
Internal Medicine Clinic Attending  I saw and evaluated the patient.  I personally confirmed the key portions of the history and exam documented by Dr. Lee and I reviewed pertinent patient test results.  The assessment, diagnosis, and plan were formulated together and I agree with the documentation in the resident's note.  

## 2019-06-20 ENCOUNTER — Ambulatory Visit: Payer: Medicare Other | Admitting: *Deleted

## 2019-06-20 DIAGNOSIS — J438 Other emphysema: Secondary | ICD-10-CM

## 2019-06-20 DIAGNOSIS — N1832 Chronic kidney disease, stage 3b: Secondary | ICD-10-CM

## 2019-06-20 DIAGNOSIS — G4733 Obstructive sleep apnea (adult) (pediatric): Secondary | ICD-10-CM

## 2019-06-20 DIAGNOSIS — E1169 Type 2 diabetes mellitus with other specified complication: Secondary | ICD-10-CM

## 2019-06-20 DIAGNOSIS — I10 Essential (primary) hypertension: Secondary | ICD-10-CM

## 2019-06-20 MED ORDER — ALBUTEROL SULFATE HFA 108 (90 BASE) MCG/ACT IN AERS: 1.0000 | INHALATION_SPRAY | Freq: Four times a day (QID) | RESPIRATORY_TRACT | Status: DC | PRN

## 2019-06-20 NOTE — Addendum Note (Signed)
Addended by: Harvie Heck on: 06/20/2019 05:19 PM   Modules accepted: Orders

## 2019-06-20 NOTE — Progress Notes (Signed)
Internal Medicine Clinic Resident  I have personally reviewed this encounter including the documentation in this note and/or discussed this patient with the care management provider. I will address any urgent items identified by the care management provider and will communicate my actions to the patient's PCP. I have reviewed the patient's CCM visit with my supervising attending, Dr Lynnae January.  Harvie Heck, MD  Internal Medicine, PGY-1 06/20/2019

## 2019-06-20 NOTE — Chronic Care Management (AMB) (Signed)
Chronic Care Management   Initial Visit Note  06/20/2019 Name: Caitlyn Aguirre MRN: [id number] DOB: [birth year]  Referred by: Axel Filler, MD Reason for referral : Chronic Care Management (HTN, DM, COPD, OSA, CKD, HF)   Caitlyn Aguirre is a 84 y.o. year old female who is a primary care patient of Axel Filler, MD. The CCM team was consulted for assistance with chronic disease management and care coordination needs related to CHF, HTN, HLD, COPD, DMII, CKD Stage 3 and Dementia  Review of patient status, including review of consultants reports, relevant laboratory and other test results, and collaboration with appropriate care team members and the patient's provider was performed as part of comprehensive patient evaluation and provision of chronic care management services.    SDOH (Social Determinants of Health) assessments performed: Yes See Care Plan activities for detailed interventions related to SDOH  SDOH Interventions     Most Recent Value  SDOH Interventions  SDOH Interventions for the Following Domains  Social Connections  Social Connections Interventions  Other (Comment) [no interventions needed]       Medications: Outpatient Encounter Medications as of 06/20/2019  Medication Sig Note  . albuterol (PROVENTIL) (2.5 MG/3ML) 0.083% nebulizer solution Take 3 mLs (2.5 mg total) by nebulization every 4 (four) hours as needed for wheezing or shortness of breath.   Marland Kitchen amLODipine (NORVASC) 5 MG tablet Take 1 tablet (5 mg total) by mouth daily.   Marland Kitchen aspirin EC 81 MG tablet Take 1 tablet (81 mg total) by mouth daily.   Marland Kitchen atorvastatin (LIPITOR) 40 MG tablet Take 1 tablet (40 mg total) by mouth daily at 6 PM.   . chlorthalidone (HYGROTON) 25 MG tablet TAKE 1 TABLET BY MOUTH  DAILY (Patient taking differently: Take 25 mg by mouth daily. )   . levothyroxine (SYNTHROID) 88 MCG tablet Take 1 tablet (88 mcg total) by mouth daily before breakfast.   . lisinopril (ZESTRIL) 40 MG  tablet Take 1 tablet (40 mg total) by mouth daily.   . metFORMIN (GLUCOPHAGE) 1000 MG tablet Take 1 tablet (1,000 mg total) by mouth 2 (two) times daily with a meal.   . omeprazole (PRILOSEC) 20 MG capsule Take 1 capsule (20 mg total) by mouth daily.   Marland Kitchen PARoxetine (PAXIL) 30 MG tablet Take 1 tablet (30 mg total) by mouth daily.   . predniSONE (DELTASONE) 20 MG tablet Take 1 tablet (20 mg total) by mouth daily with breakfast.   . ramelteon (ROZEREM) 8 MG tablet Take 1 tablet (8 mg total) by mouth at bedtime.   Marland Kitchen SPIRIVA HANDIHALER 18 MCG inhalation capsule INHALE THE CONTENTS OF 1  CAPSULE BY MOUTH VIA  HANDIHALER DAILY (Patient taking differently: Place 18 mcg into inhaler and inhale daily. )   . albuterol (PROVENTIL HFA;VENTOLIN HFA) 108 (90 Base) MCG/ACT inhaler Inhale 1-2 puffs into the lungs every 6 (six) hours as needed for wheezing or shortness of breath. (Patient not taking: Reported on 06/20/2019) 06/20/2019: Needs it refilled- will notify provider  . Blood Glucose Monitoring Suppl (CONTOUR NEXT MONITOR) w/Device KIT 1 kit by Does not apply route daily.   . carvedilol (COREG) 25 MG tablet Take 1 tablet (25 mg total) by mouth 2 (two) times daily with a meal.   . Cholecalciferol (VITAMIN D3) 10 MCG (400 UNIT) tablet Take 1 tablet (400 Units total) by mouth daily. (Patient not taking: Reported on 06/11/2019)   . glucose blood (CONTOUR NEXT TEST) test strip Use as instructed   .  Lancets MISC 1 Units by Does not apply route daily.   . Lancets Ultra Thin 30G MISC    . OVER THE COUNTER MEDICATION Take 1 capsule by mouth as needed (heartburn and gas). Heartburn and gas chews 748m calcium carbonate and 816msimethicone   . OXYGEN Inhale into the lungs at bedtime as needed (for breathing).     No facility-administered encounter medications on file as of 06/20/2019.     Objective:  Wt Readings from Last 3 Encounters:  06/15/19 211 lb 4.8 oz (95.8 kg)  06/11/19 207 lb (93.9 kg)  05/08/19 211 lb 1.6  oz (95.8 kg)    Lab Results  Component Value Date   HGBA1C 7.1 (A) 05/08/2019   HGBA1C 6.8 (A) 11/21/2018   HGBA1C 6.9 (H) 07/10/2018   Lab Results  Component Value Date   MICROALBUR 1.52 10/12/2013   LDLCALC 108 (H) 11/29/2017   CREATININE 1.35 (H) 06/11/2019    Lab Results  Component Value Date   MICROALBUR 1.52 10/12/2013   MICROALBUR 0.63 09/02/2012   Needs updated urine for protein  Goals Addressed            This Visit's Progress     Patient Stated   . "I don't have a glucometer and my doctor told me to check my blood sugar every day." (pt-stated)       CARE PLAN ENTRY (see longititudinal plan of care for additional care plan information)  Current Barriers:  . Chronic Disease Management support, education, and care coordination needs related to CHF, HTN, HLD, COPD, DMII, CKD Stage 3, and Depression  Clinical Goal(s) related to CHF, HTN, HLD, COPD, DMII, CKD Stage 3, and Depression:  Over the next  14 days, patient will:  . Work with the care management team to address educational, disease management, and care coordination needs  . Begin or continue self health monitoring activities as directed today Measure and record CBG (blood glucose) at least one times daily . Call provider office for new or worsened signs and symptoms Blood glucose findings outside established parameters, Chest pain, Shortness of breath, and New or worsened symptom related to HF, COPD, HTN  or DM . Call care management team with questions or concerns . Verbalize basic understanding of patient centered plan of care established today  Interventions related to CHF, HTN, HLD, COPD, DMII, CKD Stage 3, and Depression:  . Evaluation of current treatment plans and patient's adherence to plan as established by provider . Assessed patient understanding of disease states . Assessed patient's education and care coordination needs . Provided disease specific education to patient  . Advised patient to  [facility] to determine where she can take her Rx for the glucometer and supplies and to ask about incontinence supply benefit . Collaborated with appropriate clinical care team members regarding patient needs- message sent to provider requesting refill on Albuterol to [facility]   Patient Self Care Activities related to CHF, HTN, HLD, COPD, DMII, CKD Stage 3, and Depression:  . Patient is unable to independently self-manage chronic health conditions  Initial goal documentation       Other   . Blood Pressure < 140/90       BP Readings from Last 3 Encounters:  06/15/19 107/68  06/11/19 (!) 148/69  05/08/19 117/61   Meeting BP targets    . HEMOGLOBIN A1C < 8.0       Lab Results  Component Value Date   HGBA1C 7.1 (A) 05/08/2019  Meeting A1C target    . LDL CALC < 100       Lab Results  Component Value Date   CHOL 188 11/29/2017   HDL 57 11/29/2017   LDLCALC 108 (H) 11/29/2017   TRIG 113 11/29/2017   CHOLHDL 3.3 11/29/2017   Needs updated lipid panel- on statin therapy        Ms. Skalsky was given information about Chronic Care Management services today including:  1. CCM service includes personalized support from designated clinical staff supervised by her physician, including individualized plan of care and coordination with other care providers 2. 24/7 contact phone numbers for assistance for urgent and routine care needs. 3. Service will only be billed when office clinical staff spend 20 minutes or more in a month to coordinate care. 4. Only one practitioner may furnish and bill the service in a calendar month. 5. The patient may stop CCM services at any time (effective at the end of the month) by phone call to the office staff. 6. The patient will be responsible for cost sharing (co-pay) of up to 20% of the service fee (after annual deductible is met).  Patient agreed to services and verbal consent obtained.   Plan:   The care management team will  reach out to the patient again over the next 7-14 days.   Kelli Churn RN, CCM, [facility] RN Care Manager [phone]

## 2019-06-20 NOTE — Patient Instructions (Signed)
Visit Information It was very nice speaking with you today and I look forward to working with you.  Goals Addressed            This Visit's Progress     Patient Stated   . "I don't have a glucometer and my doctor told me to check my blood sugar every day." (pt-stated)       CARE PLAN ENTRY (see longtitudinal plan of care for additional care plan information)  Current Barriers:  . Chronic Disease Management support, education, and care coordination needs related to CHF, HTN, HLD, COPD, DMII, CKD Stage 3, and Depression  Clinical Goal(s) related to CHF, HTN, HLD, COPD, DMII, CKD Stage 3, and Depression:  Over the next  14 days, patient will:  . Work with the care management team to address educational, disease management, and care coordination needs  . Begin or continue self health monitoring activities as directed today Measure and record CBG (blood glucose) at least one times daily . Call provider office for new or worsened signs and symptoms Blood glucose findings outside established parameters, Chest pain, Shortness of breath, and New or worsened symptom related to HF, COPD, HTN  or DM . Call care management team with questions or concerns . Verbalize basic understanding of patient centered plan of care established today  Interventions related to CHF, HTN, HLD, COPD, DMII, CKD Stage 3, and Depression:  . Evaluation of current treatment plans and patient's adherence to plan as established by provider . Assessed patient understanding of disease states . Assessed patient's education and care coordination needs . Provided disease specific education to patient  . Advised patient to call [facility] Medicare customer service to determine where she can take her Rx for the glucometer and supplies and to ask about incontinence supply benefit . Collaborated with appropriate clinical care team members regarding patient needs- message sent to provider requesting refill on Albuterol to [facility]    Patient Self Care Activities related to CHF, HTN, HLD, COPD, DMII, CKD Stage 3, and Depression:  . Patient is unable to independently self-manage chronic health conditions  Initial goal documentation       Other   . Blood Pressure < 140/90       BP Readings from Last 3 Encounters:  06/15/19 107/68  06/11/19 (!) 148/69  05/08/19 117/61   Meeting BP targets    . HEMOGLOBIN A1C < 8.0       Lab Results  Component Value Date   HGBA1C 7.1 (A) 05/08/2019   Meeting A1C target    . LDL CALC < 100       Lab Results  Component Value Date   CHOL 188 11/29/2017   HDL 57 11/29/2017   LDLCALC 108 (H) 11/29/2017   TRIG 113 11/29/2017   CHOLHDL 3.3 11/29/2017   Needs updated lipid panel       Ms. Alkema was given information about Chronic Care Management services today including:  1. CCM service includes personalized support from designated clinical staff supervised by her physician, including individualized plan of care and coordination with other care providers 2. 24/7 contact phone numbers for assistance for urgent and routine care needs. 3. Service will only be billed when office clinical staff spend 20 minutes or more in a month to coordinate care. 4. Only one practitioner may furnish and bill the service in a calendar month. 5. The patient may stop CCM services at any time (effective at the end of the month) by phone  call to the office staff. 6. The patient will be responsible for cost sharing (co-pay) of up to 20% of the service fee (after annual deductible is met).  Patient agreed to services and verbal consent obtained.   The patient verbalized understanding of instructions provided today and declined a print copy of patient instruction materials.   The care management team will reach out to the patient again over the next 7-14 days.   Kelli Churn RN, CCM, [facility] RN Care Manager [phone]

## 2019-06-21 ENCOUNTER — Other Ambulatory Visit: Payer: Self-pay | Admitting: Student in an Organized Health Care Education/Training Program

## 2019-06-21 MED ORDER — ALBUTEROL SULFATE HFA 108 (90 BASE) MCG/ACT IN AERS: 1.0000 | INHALATION_SPRAY | Freq: Four times a day (QID) | RESPIRATORY_TRACT | 3 refills | Status: DC | PRN

## 2019-06-21 NOTE — Progress Notes (Signed)
Internal Medicine Clinic Attending  CCM services provided by the care management provider and their documentation were discussed with Dr. Marva Panda. We reviewed the pertinent findings, urgent action items addressed by the resident and non-urgent items to be addressed by the PCP.  I agree with the assessment, diagnosis, and plan of care documented in the CCM and resident's note.  Larey Dresser, MD 06/21/2019

## 2019-06-27 ENCOUNTER — Ambulatory Visit: Payer: Medicare Other | Admitting: *Deleted

## 2019-06-27 DIAGNOSIS — N1832 Chronic kidney disease, stage 3b: Secondary | ICD-10-CM

## 2019-06-27 DIAGNOSIS — E1169 Type 2 diabetes mellitus with other specified complication: Secondary | ICD-10-CM

## 2019-06-27 NOTE — Patient Instructions (Signed)
Visit Information It was nice speaking with you today. I will work on getting you a glucometer and testing supplies before the end of the week.   Goals Addressed            This Visit's Progress     Patient Stated   . "I don't have a glucometer and my doctor told me to check my blood sugar every day." (pt-stated)       Caitlyn Aguirre (see ornithinuria plan of care for additional care plan information)  Current Barriers:  . Chronic Disease Management support, education, and care coordination needs related to CHF, HTN, HLD, COPD, DMII, CKD Stage 3, and Depression- patient states she has still not been able to get a glucometer but did get her incontinence supplies  Clinical Goal(s) related to CHF, HTN, HLD, COPD, DMII, CKD Stage 3, and Depression:  Over the next  14 days, patient will:  . Work with the care management team to address educational, disease management, and care coordination needs  . Begin or continue self health monitoring activities as directed today Measure and record cbg (blood glucose) at least one times daily . Call provider office for new or worsened signs and symptoms Blood glucose findings outside established parameters, Chest pain, Shortness of breath, and New or worsened symptom related to HF, COPD, HTN  or DM . Call care management team with questions or concerns . Verbalize basic understanding of patient centered plan of care established today  Interventions related to CHF, HTN, HLD, COPD, DMII, CKD Stage 3, and Depression:  . Evaluation of current treatment plans and patient's adherence to plan as established by provider . Assessed patient understanding of disease states . Assessed patient's education and care coordination needs . Provided disease specific education to patient  . Advised patient this RNCM will investigate securing glucometer via [facility] or [facility] pharmacy    Patient Self Care Activities related to CHF, HTN, HLD, COPD, DMII, CKD Stage 3, and  Depression:  . Patient is unable to independently self-manage chronic health conditions  Please see past updates related to this goal by clicking on the "Past Updates" button in the selected goal         The patient verbalized understanding of instructions provided today and declined a print copy of patient instruction materials.   The care management team will reach out to the patient again over the next 7 days.   Kelli Churn RN, CCM, [facility] RN Care Manager [phone]

## 2019-06-27 NOTE — Chronic Care Management (AMB) (Signed)
Chronic Care Management   Follow Up Note   06/27/2019 Name: Caitlyn Aguirre MRN: [id number] DOB: Oct 30, 1935  Referred by: Axel Filler, MD Reason for referral : Chronic Care Management (DM, HTN COPD)   Caitlyn Aguirre is a 84 y.o. year old female who is a primary care patient of Axel Filler, MD. The CCM team was consulted for assistance with chronic disease management and care coordination needs.    Review of patient status, including review of consultants reports, relevant laboratory and other test results, and collaboration with appropriate care team members and the patient's provider was performed as part of comprehensive patient evaluation and provision of chronic care management services.    SDOH (Social Determinants of Health) assessments performed: No See Care Plan activities for detailed interventions related to [facility])     Outpatient Encounter Medications as of 06/27/2019  Medication Sig  . albuterol (PROVENTIL) (2.5 MG/3ML) 0.083% nebulizer solution Take 3 mLs (2.5 mg total) by nebulization every 4 (four) hours as needed for wheezing or shortness of breath.  Marland Kitchen albuterol (VENTOLIN HFA) 108 (90 Base) MCG/ACT inhaler Inhale 1-2 puffs into the lungs every 6 (six) hours as needed for wheezing or shortness of breath.  Marland Kitchen amLODipine (NORVASC) 5 MG tablet Take 1 tablet (5 mg total) by mouth daily.  Marland Kitchen aspirin EC 81 MG tablet Take 1 tablet (81 mg total) by mouth daily.  Marland Kitchen atorvastatin (LIPITOR) 40 MG tablet Take 1 tablet (40 mg total) by mouth daily at 6 PM.  . Blood Glucose Monitoring Suppl (CONTOUR NEXT MONITOR) w/Device KIT 1 kit by Does not apply route daily.  . carvedilol (COREG) 25 MG tablet Take 1 tablet (25 mg total) by mouth 2 (two) times daily with a meal.  . chlorthalidone (HYGROTON) 25 MG tablet TAKE 1 TABLET BY MOUTH  DAILY (Patient taking differently: Take 25 mg by mouth daily. )  . Cholecalciferol (VITAMIN D3) 10 MCG (400 UNIT) tablet Take 1 tablet (400 Units  total) by mouth daily. (Patient not taking: Reported on 06/11/2019)  . glucose blood (CONTOUR NEXT TEST) test strip Use as instructed  . Lancets MISC 1 Units by Does not apply route daily.  . Lancets Ultra Thin 30G MISC   . levothyroxine (SYNTHROID) 88 MCG tablet Take 1 tablet (88 mcg total) by mouth daily before breakfast.  . lisinopril (ZESTRIL) 40 MG tablet Take 1 tablet (40 mg total) by mouth daily.  . metFORMIN (GLUCOPHAGE) 1000 MG tablet Take 1 tablet (1,000 mg total) by mouth 2 (two) times daily with a meal.  . omeprazole (PRILOSEC) 20 MG capsule Take 1 capsule (20 mg total) by mouth daily.  Marland Kitchen OVER THE COUNTER MEDICATION Take 1 capsule by mouth as needed (heartburn and gas). Heartburn and gas chews 791m calcium carbonate and 843msimethicone  . OXYGEN Inhale into the lungs at bedtime as needed (for breathing).   . Marland KitchenARoxetine (PAXIL) 30 MG tablet Take 1 tablet (30 mg total) by mouth daily.  . predniSONE (DELTASONE) 20 MG tablet Take 1 tablet (20 mg total) by mouth daily with breakfast.  . ramelteon (ROZEREM) 8 MG tablet Take 1 tablet (8 mg total) by mouth at bedtime.  . Marland KitchenPIRIVA HANDIHALER 18 MCG inhalation capsule INHALE THE CONTENTS OF 1  CAPSULE BY MOUTH VIA  HANDIHALER DAILY (Patient taking differently: Place 18 mcg into inhaler and inhale daily. )   No facility-administered encounter medications on file as of 06/27/2019.     Objective:   Goals Addressed  This Visit's Progress     Patient Stated   . "I don't have a glucometer and my doctor told me to check my blood sugar every day." (pt-stated)       [place] (see ornithinuria plan of care for additional care plan information)  Current Barriers:  . Chronic Disease Management support, education, and care coordination needs related to CHF, HTN, HLD, COPD, DMII, CKD Stage 3, and Depression- patient states she has still not been able to get a glucometer but did get her incontinence supplies  Clinical Goal(s)  related to CHF, HTN, HLD, COPD, DMII, CKD Stage 3, and Depression:  Over the next  14 days, patient will:  . Work with the care management team to address educational, disease management, and care coordination needs  . Begin or continue self health monitoring activities as directed today Measure and record cbg (blood glucose) at least one times daily . Call provider office for new or worsened signs and symptoms Blood glucose findings outside established parameters, Chest pain, Shortness of breath, and New or worsened symptom related to HF, COPD, HTN  or DM . Call care management team with questions or concerns . Verbalize basic understanding of patient centered plan of care established today  Interventions related to CHF, HTN, HLD, COPD, DMII, CKD Stage 3, and Depression:  . Evaluation of current treatment plans and patient's adherence to plan as established by provider . Assessed patient understanding of disease states . Assessed patient's education and care coordination needs . Provided disease specific education to patient  . Advised patient this CCM RN will investigate securing glucometer via [facility] or [facility] pharmacy    Patient Self Care Activities related to CHF, HTN, HLD, COPD, DMII, CKD Stage 3, and Depression:  . Patient is unable to independently self-manage chronic health conditions  Please see past updates related to this goal by clicking on the "Past Updates" button in the selected goal          Plan:   The care management team will reach out to the patient again over the next 7 days.    Kelli Churn RN, CCM, [facility] RN Care Manager [phone]

## 2019-06-28 ENCOUNTER — Other Ambulatory Visit: Payer: Self-pay | Admitting: Dietician

## 2019-06-28 ENCOUNTER — Ambulatory Visit: Payer: Medicare Other | Admitting: *Deleted

## 2019-06-28 ENCOUNTER — Other Ambulatory Visit: Payer: Self-pay | Admitting: Student in an Organized Health Care Education/Training Program

## 2019-06-28 DIAGNOSIS — J438 Other emphysema: Secondary | ICD-10-CM

## 2019-06-28 DIAGNOSIS — I10 Essential (primary) hypertension: Secondary | ICD-10-CM

## 2019-06-28 DIAGNOSIS — N1832 Chronic kidney disease, stage 3b: Secondary | ICD-10-CM

## 2019-06-28 DIAGNOSIS — E1169 Type 2 diabetes mellitus with other specified complication: Secondary | ICD-10-CM

## 2019-06-28 MED ORDER — ACCU-CHEK SMARTVIEW VI STRP: ORAL_STRIP | 12 refills | Status: DC

## 2019-06-28 MED ORDER — ACCU-CHEK SOFTCLIX LANCETS MISC: 7 refills | Status: AC

## 2019-06-28 MED ORDER — ACCU-CHEK GUIDE ME W/DEVICE KIT: PACK | 0 refills | Status: AC

## 2019-06-28 MED ORDER — ACCU-CHEK AVIVA DEVI: 0 refills | Status: DC

## 2019-06-28 MED ORDER — ACCU-CHEK GUIDE VI STRP: ORAL_STRIP | 7 refills | Status: DC

## 2019-06-28 NOTE — Telephone Encounter (Signed)
Called pharmacy. Was told that the old prescriptions have been cancelled,. The accu chek guide prescriptions should be fine.

## 2019-06-28 NOTE — Patient Instructions (Signed)
Visit Information Please have your family member pick up the glucometer and DM education at the front desk of the clinic as soon as possible. You will begin receiving your DM testing supplies via mail ([facility]) in the next 1-2 weeks.  Goals Addressed            This Visit's Progress     Patient Stated   . "I don't have a glucometer and my doctor told me to check my blood sugar every day." (pt-stated)       CARE PLAN ENTRY (see longitudinal plan of care for additional care plan information)  Current Barriers:  . Chronic Disease Management support, education, and care coordination needs related to CHF, HTN, HLD, COPD, DMII, CKD Stage 3, and Depression- patient states she has still not been able to get a glucometer but did get her incontinence supplies  Clinical Goal(s) related to CHF, HTN, HLD, COPD, DMII, CKD Stage 3, and Depression:  Over the next  14 days, patient will:  . Work with the care management team to address educational, disease management, and care coordination needs  . Begin or continue self health monitoring activities as directed today Measure and record cbg (blood glucose) at least one times daily . Call provider office for new or worsened signs and symptoms Blood glucose findings outside established parameters, Chest pain, Shortness of breath, and New or worsened symptom related to HF, COPD, HTN  or DM . Call care management team with questions or concerns . Verbalize basic understanding of patient centered plan of care established today  Interventions related to CHF, HTN, HLD, COPD, DMII, CKD Stage 3, and Depression:  . Evaluation of current treatment plans and patient's adherence to plan as established by provider . Assessed patient understanding of disease states . Assessed patient's education and care coordination needs . Provided disease specific education to patient  . Collaborated with [name] Plyler RD and obtained Accuchek glucometer and starter kit . Placed  Accuchek glucometer and starter kit along with DM education including Living Well With Diabetes booklet,  [facility] spiral bound calendar with DM, HTN, HF and COPD action plans and atop light information, glucose log sheets and information on how to treat hypoglycemia at clinic front desk for patient's daughter to pick up . Contacted [facility] to determine if DM testing supplies can be mailed to patient if RX supplied, then faxed order for Accuchek glucometer to [facility] to enable patient to get DM testing supplies via mail    Patient Self Care Activities related to CHF, HTN, HLD, COPD, DMII, CKD Stage 3, and Depression:  . Patient is unable to independently self-manage chronic health conditions  Please see past updates related to this goal by clicking on the "Past Updates" button in the selected goal         The patient verbalized understanding of instructions provided today and declined a print copy of patient instruction materials.   The care management team will reach out to the patient again over the next 30 days.   Kelli Churn RN, CCM, [facility] RN Care Manager [phone]

## 2019-06-28 NOTE — Telephone Encounter (Signed)
Caitlyn Aguirre calls to say she is having trouble getting a glucometer and asks for help. The one ordered is no longer made. She has been asked to check  morning and night. Prescriptions corrected so she can get a meter and supplies to begin checking her blood sugar today per her request. If she needs to check 2 times a day and is not on insulin. Medicare and the pharmacy will request additional documentation that would have delayed her getting her supplies. I am happy to assist as needed if she needs to checks 2 times a day.

## 2019-06-28 NOTE — Progress Notes (Signed)
Internal Medicine Clinic Resident  I have personally reviewed this encounter including the documentation in this note and/or discussed this patient with the care management provider. I will address any urgent items identified by the care management provider and will communicate my actions to the patient's PCP. I have reviewed the patient's CCM visit with my supervising attending, Dr Vincent.  Ellis Koffler, MD 06/28/2019    

## 2019-06-28 NOTE — Chronic Care Management (AMB) (Addendum)
Chronic Care Management   Follow Up Note   06/28/2019 Name: Caitlyn Aguirre MRN: [id number] DOB: Sep 28, 1935  Referred by: Axel Filler, MD Reason for referral : Chronic Care Management (DM, COPD, HTN)   Caitlyn Aguirre is a 84 y.o. year old female who is a primary care patient of Axel Filler, MD. The CCM team was consulted for assistance with chronic disease management and care coordination needs.    Review of patient status, including review of consultants reports, relevant laboratory and other test results, and collaboration with appropriate care team members and the patient's provider was performed as part of comprehensive patient evaluation and provision of chronic care management services.    SDOH (Social Determinants of Health) assessments performed: No See Care Plan activities for detailed interventions related to [facility])     Outpatient Encounter Medications as of 06/28/2019  Medication Sig  . albuterol (PROVENTIL) (2.5 MG/3ML) 0.083% nebulizer solution Take 3 mLs (2.5 mg total) by nebulization every 4 (four) hours as needed for wheezing or shortness of breath.  Marland Kitchen albuterol (VENTOLIN HFA) 108 (90 Base) MCG/ACT inhaler Inhale 1-2 puffs into the lungs every 6 (six) hours as needed for wheezing or shortness of breath.  Marland Kitchen amLODipine (NORVASC) 5 MG tablet Take 1 tablet (5 mg total) by mouth daily.  Marland Kitchen aspirin EC 81 MG tablet Take 1 tablet (81 mg total) by mouth daily.  Marland Kitchen atorvastatin (LIPITOR) 40 MG tablet Take 1 tablet (40 mg total) by mouth daily at 6 PM.  . Blood Glucose Monitoring Suppl (ACCU-CHEK AVIVA) device Use as instructed  . carvedilol (COREG) 25 MG tablet Take 1 tablet (25 mg total) by mouth 2 (two) times daily with a meal.  . chlorthalidone (HYGROTON) 25 MG tablet TAKE 1 TABLET BY MOUTH  DAILY (Patient taking differently: Take 25 mg by mouth daily. )  . Cholecalciferol (VITAMIN D3) 10 MCG (400 UNIT) tablet Take 1 tablet (400 Units total) by mouth daily.  (Patient not taking: Reported on 06/11/2019)  . glucose blood (ACCU-CHEK SMARTVIEW) test strip Use as instructed  . Lancets MISC 1 Units by Does not apply route daily.  . Lancets Ultra Thin 30G MISC   . levothyroxine (SYNTHROID) 88 MCG tablet Take 1 tablet (88 mcg total) by mouth daily before breakfast.  . lisinopril (ZESTRIL) 40 MG tablet Take 1 tablet (40 mg total) by mouth daily.  . metFORMIN (GLUCOPHAGE) 1000 MG tablet Take 1 tablet (1,000 mg total) by mouth 2 (two) times daily with a meal.  . omeprazole (PRILOSEC) 20 MG capsule Take 1 capsule (20 mg total) by mouth daily.  Marland Kitchen OVER THE COUNTER MEDICATION Take 1 capsule by mouth as needed (heartburn and gas). Heartburn and gas chews 776m calcium carbonate and 856msimethicone  . OXYGEN Inhale into the lungs at bedtime as needed (for breathing).   . Marland KitchenARoxetine (PAXIL) 30 MG tablet Take 1 tablet (30 mg total) by mouth daily.  . predniSONE (DELTASONE) 20 MG tablet Take 1 tablet (20 mg total) by mouth daily with breakfast.  . ramelteon (ROZEREM) 8 MG tablet Take 1 tablet (8 mg total) by mouth at bedtime.  . Marland KitchenPIRIVA HANDIHALER 18 MCG inhalation capsule INHALE THE CONTENTS OF 1  CAPSULE BY MOUTH VIA  HANDIHALER DAILY (Patient taking differently: Place 18 mcg into inhaler and inhale daily. )   No facility-administered encounter medications on file as of 06/28/2019.     Objective:   Wt Readings from Last 3 Encounters:  06/15/19 211 lb 4.8  oz (95.8 kg)  06/11/19 207 lb (93.9 kg)  05/08/19 211 lb 1.6 oz (95.8 kg)   BP Readings from Last 3 Encounters:  06/15/19 107/68  06/11/19 (!) 148/69  05/08/19 117/61   Lab Results  Component Value Date   HGBA1C 7.1 (A) 05/08/2019   HGBA1C 6.8 (A) 11/21/2018   HGBA1C 6.9 (H) 07/10/2018   Lab Results  Component Value Date   MICROALBUR 1.52 10/12/2013   LDLCALC 108 (H) 11/29/2017   CREATININE 1.35 (H) 06/11/2019   Lab Results  Component Value Date   MICROALBUR 1.52 10/12/2013   MICROALBUR 0.63  09/02/2012    Please update yearly diabetes urine for protein  Lab Results  Component Value Date   CHOL 188 11/29/2017   HDL 57 11/29/2017   LDLCALC 108 (H) 11/29/2017   TRIG 113 11/29/2017   CHOLHDL 3.3 11/29/2017  Yearly lipid panel needed- on statin therapy and has DM  Goals Addressed            This Visit's Progress     Patient Stated   . "I don't have a glucometer and my doctor told me to check my blood sugar every day." (pt-stated)       CARE PLAN ENTRY (see longitudinal plan of care for additional care plan information)  Current Barriers:  . Chronic Disease Management support, education, and care coordination needs related to CHF, HTN, HLD, COPD, DMII, CKD Stage 3, and Depression- patient states she has still not been able to get a glucometer but did get her incontinence supplies  Clinical Goal(s) related to CHF, HTN, HLD, COPD, DMII, CKD Stage 3, and Depression:  Over the next  14 days, patient will:  . Work with the care management team to address educational, disease management, and care coordination needs  . Begin or continue self health monitoring activities as directed today Measure and record cbg (blood glucose) at least one times daily . Call provider office for new or worsened signs and symptoms Blood glucose findings outside established parameters, Chest pain, Shortness of breath, and New or worsened symptom related to HF, COPD, HTN  or DM . Call care management team with questions or concerns . Verbalize basic understanding of patient centered plan of care established today  Interventions related to CHF, HTN, HLD, COPD, DMII, CKD Stage 3, and Depression:  . Evaluation of current treatment plans and patient's adherence to plan as established by provider . Assessed patient understanding of disease states . Assessed patient's education and care coordination needs . Provided disease specific education to patient  . Collaborated with [name] Plyler RD and obtained  Accuchek glucometer and starter kit . Placed Accuchek glucometer and starter kit along with DM education including Living Well With Diabetes booklet,  [facility] spiral bound calendar with DM, HTN, HF and COPD action plans and stop light information, glucose log sheets and information on how to treat hypoglycemia at clinic front desk for patient's daughter to pick up . Contacted [facility] to determine if DM testing supplies can be mailed to patient if RX supplied, then faxed order for Accuchek glucometer to [facility] to enable patient to get DM testing supplies via mail    Patient Self Care Activities related to CHF, HTN, HLD, COPD, DMII, CKD Stage 3, and Depression:  . Patient is unable to independently self-manage chronic health conditions  Please see past updates related to this goal by clicking on the "Past Updates" button in the selected goal  Plan:   The care management team will reach out to the patient again over the next 30 days.    Kelli Churn RN, CCM, [facility] RN Care Manager [phone]

## 2019-06-28 NOTE — Progress Notes (Signed)
Insurance requested change of glucometer and strips to Accu-chek brand.

## 2019-06-28 NOTE — Progress Notes (Signed)
Internal Medicine Clinic Attending  CCM services provided by the care management provider and their documentation were discussed with Dr. Chundi. We reviewed the pertinent findings, urgent action items addressed by the resident and non-urgent items to be addressed by the PCP.  I agree with the assessment, diagnosis, and plan of care documented in the CCM and resident's note.  Anyi Fels Thomas Lexxie Winberg, MD 06/28/2019  

## 2019-07-04 DIAGNOSIS — G4733 Obstructive sleep apnea (adult) (pediatric): Secondary | ICD-10-CM | POA: Diagnosis not present

## 2019-07-04 DIAGNOSIS — J449 Chronic obstructive pulmonary disease, unspecified: Secondary | ICD-10-CM | POA: Diagnosis not present

## 2019-07-05 ENCOUNTER — Ambulatory Visit: Payer: Medicare Other | Admitting: *Deleted

## 2019-07-05 DIAGNOSIS — E1169 Type 2 diabetes mellitus with other specified complication: Secondary | ICD-10-CM

## 2019-07-05 DIAGNOSIS — I10 Essential (primary) hypertension: Secondary | ICD-10-CM

## 2019-07-05 DIAGNOSIS — J438 Other emphysema: Secondary | ICD-10-CM

## 2019-07-05 NOTE — Chronic Care Management (AMB) (Signed)
Chronic Care Management   Follow Up Note   07/05/2019 Name: Caitlyn Aguirre MRN: [id number] DOB: [birth year]  Referred by: Axel Filler, MD Reason for referral : Chronic Care Management (DM, HTN, CKD)   Caitlyn Aguirre is a 84 y.o. year old female who is a primary care patient of Axel Filler, MD. The CCM team was consulted for assistance with chronic disease management and care coordination needs.    Review of patient status, including review of consultants reports, relevant laboratory and other test results, and collaboration with appropriate care team members and the patient's provider was performed as part of comprehensive patient evaluation and provision of chronic care management services.    SDOH (Social Determinants of Health) assessments performed: No See Care Plan activities for detailed interventions related to [facility])     Outpatient Encounter Medications as of 07/05/2019  Medication Sig  . Accu-Chek Softclix Lancets lancets Check 1 time a day as instructed  . albuterol (PROVENTIL) (2.5 MG/3ML) 0.083% nebulizer solution Take 3 mLs (2.5 mg total) by nebulization every 4 (four) hours as needed for wheezing or shortness of breath.  Marland Kitchen albuterol (VENTOLIN HFA) 108 (90 Base) MCG/ACT inhaler Inhale 1-2 puffs into the lungs every 6 (six) hours as needed for wheezing or shortness of breath.  Marland Kitchen amLODipine (NORVASC) 5 MG tablet Take 1 tablet (5 mg total) by mouth daily.  Marland Kitchen aspirin EC 81 MG tablet Take 1 tablet (81 mg total) by mouth daily.  Marland Kitchen atorvastatin (LIPITOR) 40 MG tablet Take 1 tablet (40 mg total) by mouth daily at 6 PM.  . Blood Glucose Monitoring Suppl (ACCU-CHEK GUIDE ME) w/Device KIT Check 1 times a day as instructed  . carvedilol (COREG) 25 MG tablet Take 1 tablet (25 mg total) by mouth 2 (two) times daily with a meal.  . chlorthalidone (HYGROTON) 25 MG tablet TAKE 1 TABLET BY MOUTH  DAILY (Patient taking differently: Take 25 mg by mouth daily. )  .  Cholecalciferol (VITAMIN D3) 10 MCG (400 UNIT) tablet Take 1 tablet (400 Units total) by mouth daily. (Patient not taking: Reported on 06/11/2019)  . glucose blood (ACCU-CHEK GUIDE) test strip Check blood sugar 1 time per day  . levothyroxine (SYNTHROID) 88 MCG tablet Take 1 tablet (88 mcg total) by mouth daily before breakfast.  . lisinopril (ZESTRIL) 40 MG tablet Take 1 tablet (40 mg total) by mouth daily.  . metFORMIN (GLUCOPHAGE) 1000 MG tablet Take 1 tablet (1,000 mg total) by mouth 2 (two) times daily with a meal.  . omeprazole (PRILOSEC) 20 MG capsule Take 1 capsule (20 mg total) by mouth daily.  Marland Kitchen OVER THE COUNTER MEDICATION Take 1 capsule by mouth as needed (heartburn and gas). Heartburn and gas chews 776m calcium carbonate and 89msimethicone  . OXYGEN Inhale into the lungs at bedtime as needed (for breathing).   . Marland KitchenARoxetine (PAXIL) 30 MG tablet Take 1 tablet (30 mg total) by mouth daily.  . predniSONE (DELTASONE) 20 MG tablet Take 1 tablet (20 mg total) by mouth daily with breakfast.  . ramelteon (ROZEREM) 8 MG tablet Take 1 tablet (8 mg total) by mouth at bedtime.  . Marland KitchenPIRIVA HANDIHALER 18 MCG inhalation capsule INHALE THE CONTENTS OF 1  CAPSULE BY MOUTH VIA  HANDIHALER DAILY (Patient taking differently: Place 18 mcg into inhaler and inhale daily. )   No facility-administered encounter medications on file as of 07/05/2019.     Objective:  Lab Results  Component Value Date   HGBA1C  7.1 (A) 05/08/2019   HGBA1C 6.8 (A) 11/21/2018   HGBA1C 6.9 (H) 07/10/2018   Lab Results  Component Value Date   MICROALBUR 1.52 10/12/2013   LDLCALC 108 (H) 11/29/2017   CREATININE 1.35 (H) 06/11/2019   Lab Results  Component Value Date   MICROALBUR 1.52 10/12/2013   MICROALBUR 0.63 09/02/2012   Needs yearly urine for protein. Lab Results  Component Value Date   CHOL 188 11/29/2017   HDL 57 11/29/2017   LDLCALC 108 (H) 11/29/2017   TRIG 113 11/29/2017   CHOLHDL 3.3 11/29/2017  needs  yearly lipid panel  BP Readings from Last 3 Encounters:  06/15/19 107/68  06/11/19 (!) 148/69  05/08/19 117/61   Wt Readings from Last 3 Encounters:  06/15/19 211 lb 4.8 oz (95.8 kg)  06/11/19 207 lb (93.9 kg)  05/08/19 211 lb 1.6 oz (95.8 kg)   Goals Addressed            This Visit's Progress     Patient Stated   . "I don't have a glucometer and my doctor told me to check my blood sugar every day." (pt-stated)       CARE PLAN ENTRY (see longitudinal plan of care for additional care plan information)  Current Barriers:  . Chronic Disease Management support, education, and care coordination needs related to CHF, HTN, HLD, COPD, DMII, CKD Stage 3, and Depression- patient states she thought she was suppose to go the [facility] pharmacy for the glucometer   Clinical Goal(s) related to CHF, HTN, HLD, COPD, DMII, CKD Stage 3, and Depression:  Over the next  14 days, patient will:  . Work with the care management team to address educational, disease management, and care coordination needs  . Begin or continue self health monitoring activities as directed today Measure and record cbg (blood glucose) at least one times daily . Call provider office for new or worsened signs and symptoms Blood glucose findings outside established parameters, Chest pain, Shortness of breath, and New or worsened symptom related to HF, COPD, HTN  or DM . Call care management team with questions or concerns . Verbalize basic understanding of patient centered plan of care established today  Interventions related to CHF, HTN, HLD, COPD, DMII, CKD Stage 3, and Depression:  . Called patient and reminded her she has a glucometer and educational material at the clinic front desk for pick up    Patient Self Care Activities related to CHF, HTN, HLD, COPD, DMII, CKD Stage 3, and Depression:  . Patient is unable to independently self-manage chronic health conditions  Please see past updates related to this goal  by clicking on the "Past Updates" button in the selected goal          Plan:   The care management team will reach out to the patient again over the next 30 days.    Kelli Churn RN, CCM, [facility] RN Care Manager [phone]

## 2019-07-05 NOTE — Patient Instructions (Signed)
Visit Information It was nice speaking with you today Please stop by the clinic's front desk and pick up your glucometer and educational materials. Begin checking your blood sugar as directed by Dr. Evette Doffing and bring your glucometer to your next clinic appointment.  Goals Addressed            This Visit's Progress     Patient Stated   . "I don't have a glucometer and my doctor told me to check my blood sugar every day." (pt-stated)       CARE PLAN ENTRY (see longitudinal plan of care for additional care plan information)  Current Barriers:  . Chronic Disease Management support, education, and care coordination needs related to CHF, HTN, HLD, COPD, DMII, CKD Stage 3, and Depression- patient states she thought she was suppose to go the [facility] pharmacy for the glucometer   Clinical Goal(s) related to CHF, HTN, HLD, COPD, DMII, CKD Stage 3, and Depression:  Over the next  14 days, patient will:  . Work with the care management team to address educational, disease management, and care coordination needs  . Begin or continue self health monitoring activities as directed today Measure and record cbg (blood glucose) at least one times daily . Call provider office for new or worsened signs and symptoms Blood glucose findings outside established parameters, Chest pain, Shortness of breath, and New or worsened symptom related to HF, COPD, HTN  or DM . Call care management team with questions or concerns . Verbalize basic understanding of patient centered plan of care established today  Interventions related to CHF, HTN, HLD, COPD, DMII, CKD Stage 3, and Depression:  . Called patient and reminded her she has a glucometer and educational material at the clinic front desk for pick up    Patient Self Care Activities related to CHF, HTN, HLD, COPD, DMII, CKD Stage 3, and Depression:  . Patient is unable to independently self-manage chronic health conditions  Please see past updates related  to this goal by clicking on the "Past Updates" button in the selected goal         The patient verbalized understanding of instructions provided today and declined a print copy of patient instruction materials.   The care management team will reach out to the patient again over the next 30 days.   Kelli Churn RN, CCM, [facility] RN Care Manager [phone]

## 2019-07-06 NOTE — Addendum Note (Signed)
Addended by: Earlene Plater on: 07/06/2019 05:28 PM   Modules accepted: Orders

## 2019-07-06 NOTE — Addendum Note (Signed)
Addended by: Earlene Plater on: 07/06/2019 05:24 PM   Modules accepted: Orders

## 2019-07-06 NOTE — Progress Notes (Addendum)
Internal Medicine Clinic Resident  Plan: -Ordered Lipid Panel -Ordered urine microalbumin/cr ratio   -Labs should be collected at next visit   I have personally reviewed this encounter including the documentation in this note and/or discussed this patient with the care management provider. I will address any urgent items identified by the care management provider and will communicate my actions to the patient's PCP. I have reviewed the patient's CCM visit with my supervising attending, Dr Lynnae January.  Earlene Plater, MD 07/06/2019

## 2019-07-10 ENCOUNTER — Ambulatory Visit (INDEPENDENT_AMBULATORY_CARE_PROVIDER_SITE_OTHER): Payer: Medicare Other | Admitting: Student in an Organized Health Care Education/Training Program

## 2019-07-10 ENCOUNTER — Telehealth: Payer: Medicare Other

## 2019-07-10 ENCOUNTER — Encounter: Payer: Self-pay | Admitting: Student in an Organized Health Care Education/Training Program

## 2019-07-10 VITALS — BP 118/64 | HR 71 | Temp 98.1°F | Ht 68.0 in | Wt 207.3 lb

## 2019-07-10 DIAGNOSIS — M353 Polymyalgia rheumatica: Secondary | ICD-10-CM | POA: Diagnosis not present

## 2019-07-10 DIAGNOSIS — E1169 Type 2 diabetes mellitus with other specified complication: Secondary | ICD-10-CM

## 2019-07-10 MED ORDER — PREDNISONE 5 MG PO TABS: ORAL_TABLET | ORAL | 3 refills | Status: DC

## 2019-07-10 NOTE — Progress Notes (Signed)
   Assessment and Plan:  See Encounters tab for problem-based medical decision making.   __________________________________________________________  HPI:   84 year old person here for follow-up of polymyalgia rheumatica.  We have this diagnosis in late April and she responded very well to prednisone 40 mg daily with quick resolution of her myalgias and stiffness.  She stopped taking the medications suddenly in early May which resulted in severe return of symptoms and emergency department visit.  Symptoms again resolved after we restarted the prednisone at 20 mg daily in May.  Continues to be doing well, no myalgias.  Reports good functional status.  Independent in her activities of daily living.  Feels the prednisone does make her jittery throughout the day and gives her some difficulty with sleeping.  Has been checking her finger stick glucose, her daughter helps her with this.  She wants to get back to driving, denies having any trouble with car accidents before.  Reports good adherence with all her other medications, no other side effects.  Wants to come off the prednisone as soon as able.  __________________________________________________________  Problem List: Patient Active Problem List   Diagnosis Date Noted  . High Risk for Falls 07/18/2015    Priority: High  . Depression 01/21/2006    Priority: High  . Essential hypertension 01/21/2006    Priority: High  . Polymyalgia rheumatica ([place]) 01/21/2006    Priority: High  . Type 2 diabetes mellitus with other specified complication ([place]) 26/20/3559    Priority: High  . CKD (chronic kidney disease) stage 3, GFR 30-59 ml/min 05/08/2019    Priority: Medium  . Obstructive sleep apnea 12/25/2011    Priority: Medium  . Hypothyroidism 01/21/2006    Priority: Medium  . COPD ([place]) 01/21/2006    Priority: Medium  . Osteoarthritis 01/21/2006    Priority: Medium  . GERD (gastroesophageal reflux disease) 06/16/2015    Priority: Low  .  Urinary, incontinence, stress female 05/09/2015    Priority: Low  . Insomnia 05/09/2015    Priority: Low  . Eczema of hand 06/21/2013    Priority: Low  . Preventive measure 02/17/2013    Priority: Low    Medications: Reconciled today in Epic __________________________________________________________  Physical Exam:  Vital Signs: Vitals:   07/10/19 0955  BP: 118/64  Pulse: 71  Temp: 98.1 F (36.7 C)  TempSrc: Oral  SpO2: 96%  Weight: 207 lb 4.8 oz (94 kg)  Height: 5\' 8"  (1.727 m)    Gen: Well appearing, NAD CV: RRR, 2 out of 6 early systolic murmur at the left upper sternal border Ext: Warm, no edema, Psych: Appropriate affect, not depressed or anxious appearing

## 2019-07-10 NOTE — Patient Instructions (Signed)
Was a pleasure seeing you in clinic today.  We talked about your new prednisone prescription.  I decreased the dose of each tablet to 5 mg.  Please follow this schedule:  Prednisone 3-1/2 tablets daily June 7 through July 4 Prednisone 3 tablets daily July 5 through August 1 Prednisone 2-1/2 tablets daily August 2 through August 29 Prednisone 2 tablets daily August 30 through September 26  We will have a visit in 3 months and can talk about how the decrease the prednisone down farther after that.

## 2019-07-10 NOTE — Assessment & Plan Note (Signed)
Started home glucose monitoring due to prednisone dosing for PMR.  Blood sugars look okay at home ranging between 120 and 140.  No episodes of hypoglycemia noted despite prednisone.  Order to start tapering down on the prednisone dose over the next few months.  Plan to continue with Metformin 1000 mg twice daily.  No need for insulin right now.  Advised continuing to check home glucose once a day at different times.  Call if there are any hyperglycemia or episodes of polyuria.

## 2019-07-10 NOTE — Assessment & Plan Note (Signed)
Symptomatically doing very well on prednisone 20 mg daily.  She has been stable on this dose for about 4 weeks.  Wants to come off medication because of side effects including insomnia and jitteriness.  Plan is for a slow taper decreasing by 2.5 mg every 4 weeks until we reach 10 mg daily.  After that will decrease by 1 mg every 2-4 weeks until we reach her lowest tolerable dose.  I gave her instructions about how to do this taper using 5 mg tablets of the prednisone.  We will follow-up with her with a telephone visit in 2-3 months.  Gave her warnings again about not stopping prednisone suddenly given risk for recurrent myalgias as well as adrenal insufficiency.

## 2019-07-15 ENCOUNTER — Inpatient Hospital Stay (HOSPITAL_COMMUNITY)
Admission: AD | Admit: 2019-07-15 | Discharge: 2019-07-24 | DRG: 853 | Disposition: A | Discharge status: Home / Self Care | Payer: Medicare Other | Attending: Family Medicine | Admitting: Family Medicine

## 2019-07-15 ENCOUNTER — Emergency Department (HOSPITAL_COMMUNITY): Payer: Medicare Other

## 2019-07-15 ENCOUNTER — Encounter (HOSPITAL_COMMUNITY): Payer: Self-pay

## 2019-07-15 ENCOUNTER — Other Ambulatory Visit: Payer: Self-pay

## 2019-07-15 ENCOUNTER — Ambulatory Visit (INDEPENDENT_AMBULATORY_CARE_PROVIDER_SITE_OTHER)
Admission: EM | Admit: 2019-07-15 | Discharge: 2019-07-15 | Disposition: A | Discharge status: Home / Self Care | Payer: Medicare Other | Source: Home / Self Care

## 2019-07-15 DIAGNOSIS — N1832 Chronic kidney disease, stage 3b: Secondary | ICD-10-CM | POA: Diagnosis present

## 2019-07-15 DIAGNOSIS — J438 Other emphysema: Secondary | ICD-10-CM | POA: Diagnosis not present

## 2019-07-15 DIAGNOSIS — I130 Hypertensive heart and chronic kidney disease with heart failure and stage 1 through stage 4 chronic kidney disease, or unspecified chronic kidney disease: Secondary | ICD-10-CM | POA: Diagnosis not present

## 2019-07-15 DIAGNOSIS — M353 Polymyalgia rheumatica: Secondary | ICD-10-CM | POA: Diagnosis not present

## 2019-07-15 DIAGNOSIS — J9621 Acute and chronic respiratory failure with hypoxia: Secondary | ICD-10-CM | POA: Diagnosis not present

## 2019-07-15 DIAGNOSIS — I509 Heart failure, unspecified: Secondary | ICD-10-CM | POA: Diagnosis not present

## 2019-07-15 DIAGNOSIS — I2510 Atherosclerotic heart disease of native coronary artery without angina pectoris: Secondary | ICD-10-CM | POA: Diagnosis not present

## 2019-07-15 DIAGNOSIS — A419 Sepsis, unspecified organism: Secondary | ICD-10-CM | POA: Diagnosis not present

## 2019-07-15 DIAGNOSIS — Z9049 Acquired absence of other specified parts of digestive tract: Secondary | ICD-10-CM

## 2019-07-15 DIAGNOSIS — Z955 Presence of coronary angioplasty implant and graft: Secondary | ICD-10-CM | POA: Diagnosis not present

## 2019-07-15 DIAGNOSIS — G4733 Obstructive sleep apnea (adult) (pediatric): Secondary | ICD-10-CM | POA: Diagnosis present

## 2019-07-15 DIAGNOSIS — E1122 Type 2 diabetes mellitus with diabetic chronic kidney disease: Secondary | ICD-10-CM | POA: Diagnosis present

## 2019-07-15 DIAGNOSIS — L0231 Cutaneous abscess of buttock: Secondary | ICD-10-CM | POA: Diagnosis not present

## 2019-07-15 DIAGNOSIS — D62 Acute posthemorrhagic anemia: Secondary | ICD-10-CM | POA: Diagnosis not present

## 2019-07-15 DIAGNOSIS — E119 Type 2 diabetes mellitus without complications: Secondary | ICD-10-CM

## 2019-07-15 DIAGNOSIS — K610 Anal abscess: Secondary | ICD-10-CM | POA: Diagnosis not present

## 2019-07-15 DIAGNOSIS — Z20822 Contact with and (suspected) exposure to covid-19: Secondary | ICD-10-CM | POA: Diagnosis not present

## 2019-07-15 DIAGNOSIS — E872 Acidosis: Secondary | ICD-10-CM | POA: Diagnosis not present

## 2019-07-15 DIAGNOSIS — Z9981 Dependence on supplemental oxygen: Secondary | ICD-10-CM

## 2019-07-15 DIAGNOSIS — E039 Hypothyroidism, unspecified: Secondary | ICD-10-CM | POA: Diagnosis present

## 2019-07-15 DIAGNOSIS — Z8673 Personal history of transient ischemic attack (TIA), and cerebral infarction without residual deficits: Secondary | ICD-10-CM | POA: Diagnosis not present

## 2019-07-15 DIAGNOSIS — J449 Chronic obstructive pulmonary disease, unspecified: Secondary | ICD-10-CM | POA: Diagnosis present

## 2019-07-15 DIAGNOSIS — N179 Acute kidney failure, unspecified: Secondary | ICD-10-CM | POA: Diagnosis present

## 2019-07-15 DIAGNOSIS — I252 Old myocardial infarction: Secondary | ICD-10-CM

## 2019-07-15 DIAGNOSIS — I10 Essential (primary) hypertension: Secondary | ICD-10-CM | POA: Diagnosis present

## 2019-07-15 DIAGNOSIS — L03317 Cellulitis of buttock: Secondary | ICD-10-CM | POA: Diagnosis present

## 2019-07-15 DIAGNOSIS — J9601 Acute respiratory failure with hypoxia: Secondary | ICD-10-CM | POA: Diagnosis not present

## 2019-07-15 DIAGNOSIS — E1169 Type 2 diabetes mellitus with other specified complication: Secondary | ICD-10-CM | POA: Diagnosis not present

## 2019-07-15 DIAGNOSIS — N183 Chronic kidney disease, stage 3 unspecified: Secondary | ICD-10-CM | POA: Diagnosis not present

## 2019-07-15 DIAGNOSIS — R0602 Shortness of breath: Secondary | ICD-10-CM

## 2019-07-15 DIAGNOSIS — R600 Localized edema: Secondary | ICD-10-CM | POA: Diagnosis not present

## 2019-07-15 DIAGNOSIS — Z7984 Long term (current) use of oral hypoglycemic drugs: Secondary | ICD-10-CM

## 2019-07-15 DIAGNOSIS — Z87891 Personal history of nicotine dependence: Secondary | ICD-10-CM | POA: Diagnosis not present

## 2019-07-15 DIAGNOSIS — Z0389 Encounter for observation for other suspected diseases and conditions ruled out: Secondary | ICD-10-CM | POA: Diagnosis not present

## 2019-07-15 DIAGNOSIS — I5030 Unspecified diastolic (congestive) heart failure: Secondary | ICD-10-CM | POA: Diagnosis present

## 2019-07-15 DIAGNOSIS — E875 Hyperkalemia: Secondary | ICD-10-CM | POA: Diagnosis not present

## 2019-07-15 DIAGNOSIS — E669 Obesity, unspecified: Secondary | ICD-10-CM | POA: Diagnosis present

## 2019-07-15 DIAGNOSIS — J479 Bronchiectasis, uncomplicated: Secondary | ICD-10-CM | POA: Diagnosis not present

## 2019-07-15 DIAGNOSIS — K219 Gastro-esophageal reflux disease without esophagitis: Secondary | ICD-10-CM | POA: Diagnosis present

## 2019-07-15 DIAGNOSIS — Z6831 Body mass index (BMI) 31.0-31.9, adult: Secondary | ICD-10-CM

## 2019-07-15 DIAGNOSIS — R509 Fever, unspecified: Secondary | ICD-10-CM

## 2019-07-15 DIAGNOSIS — G459 Transient cerebral ischemic attack, unspecified: Secondary | ICD-10-CM | POA: Diagnosis present

## 2019-07-15 LAB — URINALYSIS, ROUTINE W REFLEX MICROSCOPIC
Bilirubin Urine: NEGATIVE
Glucose, UA: NEGATIVE mg/dL
Hgb urine dipstick: NEGATIVE
Ketones, ur: NEGATIVE mg/dL
Leukocytes,Ua: NEGATIVE
Nitrite: NEGATIVE
Protein, ur: NEGATIVE mg/dL
Specific Gravity, Urine: 1.01 (ref 1.005–1.030)
pH: 5 (ref 5.0–8.0)

## 2019-07-15 LAB — COMPREHENSIVE METABOLIC PANEL
ALT: 15 U/L (ref 0–44)
AST: 12 U/L — ABNORMAL LOW (ref 15–41)
Albumin: 3.4 g/dL — ABNORMAL LOW (ref 3.5–5.0)
Alkaline Phosphatase: 87 U/L (ref 38–126)
Anion gap: 12 (ref 5–15)
BUN: 26 mg/dL — ABNORMAL HIGH (ref 8–23)
CO2: 23 mmol/L (ref 22–32)
Calcium: 9.9 mg/dL (ref 8.9–10.3)
Chloride: 102 mmol/L (ref 98–111)
Creatinine, Ser: 1.37 mg/dL — ABNORMAL HIGH (ref 0.44–1.00)
GFR calc Af Amer: 42 mL/min — ABNORMAL LOW (ref >60)
GFR calc non Af Amer: 36 mL/min — ABNORMAL LOW (ref >60)
Glucose, Bld: 187 mg/dL — ABNORMAL HIGH (ref 70–99)
Potassium: 4.9 mmol/L (ref 3.5–5.1)
Sodium: 137 mmol/L (ref 135–145)
Total Bilirubin: 1.3 mg/dL — ABNORMAL HIGH (ref 0.3–1.2)
Total Protein: 7.2 g/dL (ref 6.5–8.1)

## 2019-07-15 LAB — CBC WITH DIFFERENTIAL/PLATELET
Abs Immature Granulocytes: 0.22 10*3/uL — ABNORMAL HIGH (ref 0.00–0.07)
Basophils Absolute: 0 10*3/uL (ref 0.0–0.1)
Basophils Relative: 0 %
Eosinophils Absolute: 0.2 10*3/uL (ref 0.0–0.5)
Eosinophils Relative: 1 %
HCT: 34.8 % — ABNORMAL LOW (ref 36.0–46.0)
Hemoglobin: 11.2 g/dL — ABNORMAL LOW (ref 12.0–15.0)
Immature Granulocytes: 1 %
Lymphocytes Relative: 11 %
Lymphs Abs: 2 10*3/uL (ref 0.7–4.0)
MCH: 28.2 pg (ref 26.0–34.0)
MCHC: 32.2 g/dL (ref 30.0–36.0)
MCV: 87.7 fL (ref 80.0–100.0)
Monocytes Absolute: 1.9 10*3/uL — ABNORMAL HIGH (ref 0.1–1.0)
Monocytes Relative: 10 %
Neutro Abs: 14.6 10*3/uL — ABNORMAL HIGH (ref 1.7–7.7)
Neutrophils Relative %: 77 %
Platelets: 187 10*3/uL (ref 150–400)
RBC: 3.97 MIL/uL (ref 3.87–5.11)
RDW: 16.4 % — ABNORMAL HIGH (ref 11.5–15.5)
WBC: 18.9 10*3/uL — ABNORMAL HIGH (ref 4.0–10.5)
nRBC: 0.1 % (ref 0.0–0.2)

## 2019-07-15 LAB — GLUCOSE, CAPILLARY: Glucose-Capillary: 179 mg/dL — ABNORMAL HIGH (ref 70–99)

## 2019-07-15 LAB — PROTIME-INR
INR: 1.1 (ref 0.8–1.2)
Prothrombin Time: 13.7 seconds (ref 11.4–15.2)

## 2019-07-15 LAB — LACTIC ACID, PLASMA: Lactic Acid, Venous: 1.8 mmol/L (ref 0.5–1.9)

## 2019-07-15 LAB — SARS CORONAVIRUS 2 BY RT PCR (HOSPITAL ORDER, PERFORMED IN ~~LOC~~ HOSPITAL LAB): SARS Coronavirus 2: NEGATIVE

## 2019-07-15 LAB — APTT: aPTT: 27 seconds (ref 24–36)

## 2019-07-15 MED ORDER — PREDNISONE 5 MG PO TABS
17.5000 mg | ORAL_TABLET | Freq: Every day | ORAL | Status: DC
  Administered 2019-07-16 – 2019-07-24 (×8): 17.5 mg via ORAL
  Filled 2019-07-15 (×9): qty 4

## 2019-07-15 MED ORDER — LATANOPROST 0.005 % OP SOLN
1.0000 [drp] | Freq: Every day | OPHTHALMIC | Status: DC
  Administered 2019-07-15 – 2019-07-23 (×8): 1 [drp] via OPHTHALMIC
  Filled 2019-07-15 (×2): qty 2.5

## 2019-07-15 MED ORDER — INSULIN ASPART 100 UNIT/ML ~~LOC~~ SOLN
0.0000 [IU] | Freq: Three times a day (TID) | SUBCUTANEOUS | Status: DC
  Administered 2019-07-16: 3 [IU] via SUBCUTANEOUS
  Administered 2019-07-16: 5 [IU] via SUBCUTANEOUS
  Administered 2019-07-16: 2 [IU] via SUBCUTANEOUS
  Administered 2019-07-17 – 2019-07-18 (×4): 3 [IU] via SUBCUTANEOUS
  Administered 2019-07-18 – 2019-07-19 (×2): 2 [IU] via SUBCUTANEOUS
  Administered 2019-07-19: 3 [IU] via SUBCUTANEOUS
  Administered 2019-07-20: 2 [IU] via SUBCUTANEOUS
  Administered 2019-07-21: 3 [IU] via SUBCUTANEOUS
  Administered 2019-07-21: 5 [IU] via SUBCUTANEOUS
  Administered 2019-07-22: 2 [IU] via SUBCUTANEOUS
  Administered 2019-07-22: 3 [IU] via SUBCUTANEOUS
  Administered 2019-07-23: 7 [IU] via SUBCUTANEOUS
  Administered 2019-07-24: 2 [IU] via SUBCUTANEOUS
  Administered 2019-07-24: 1 [IU] via SUBCUTANEOUS

## 2019-07-15 MED ORDER — HYDROCODONE-ACETAMINOPHEN 5-325 MG PO TABS
1.0000 | ORAL_TABLET | ORAL | Status: DC | PRN
  Administered 2019-07-16 (×2): 1 via ORAL
  Filled 2019-07-15 (×3): qty 1

## 2019-07-15 MED ORDER — ATORVASTATIN CALCIUM 40 MG PO TABS
40.0000 mg | ORAL_TABLET | Freq: Every day | ORAL | Status: DC
  Administered 2019-07-16 – 2019-07-23 (×8): 40 mg via ORAL
  Filled 2019-07-15 (×8): qty 1

## 2019-07-15 MED ORDER — SENNOSIDES-DOCUSATE SODIUM 8.6-50 MG PO TABS: 1.0000 | ORAL_TABLET | Freq: Every evening | ORAL | Status: DC | PRN

## 2019-07-15 MED ORDER — LEVOTHYROXINE SODIUM 88 MCG PO TABS
88.0000 ug | ORAL_TABLET | Freq: Every day | ORAL | Status: DC
  Administered 2019-07-16 – 2019-07-24 (×9): 88 ug via ORAL
  Filled 2019-07-15 (×10): qty 1

## 2019-07-15 MED ORDER — ONDANSETRON HCL 4 MG/2ML IJ SOLN: 4.0000 mg | Freq: Four times a day (QID) | INTRAMUSCULAR | Status: DC | PRN

## 2019-07-15 MED ORDER — CARVEDILOL 25 MG PO TABS
25.0000 mg | ORAL_TABLET | Freq: Two times a day (BID) | ORAL | Status: DC
  Administered 2019-07-16 – 2019-07-23 (×12): 25 mg via ORAL
  Filled 2019-07-15 (×15): qty 1

## 2019-07-15 MED ORDER — SODIUM CHLORIDE 0.9 % IV BOLUS
1000.0000 mL | Freq: Once | INTRAVENOUS | Status: AC
  Administered 2019-07-15: 1000 mL via INTRAVENOUS

## 2019-07-15 MED ORDER — INSULIN ASPART 100 UNIT/ML ~~LOC~~ SOLN
0.0000 [IU] | Freq: Every day | SUBCUTANEOUS | Status: DC
  Administered 2019-07-23: 2 [IU] via SUBCUTANEOUS

## 2019-07-15 MED ORDER — SODIUM CHLORIDE 0.9 % IV SOLN
2.0000 g | Freq: Once | INTRAVENOUS | Status: AC
  Administered 2019-07-15: 2 g via INTRAVENOUS
  Filled 2019-07-15: qty 20

## 2019-07-15 MED ORDER — VANCOMYCIN HCL IN DEXTROSE 1-5 GM/200ML-% IV SOLN
1000.0000 mg | Freq: Once | INTRAVENOUS | Status: AC
  Administered 2019-07-15: 1000 mg via INTRAVENOUS
  Filled 2019-07-15: qty 200

## 2019-07-15 MED ORDER — ALBUTEROL SULFATE HFA 108 (90 BASE) MCG/ACT IN AERS: 1.0000 | INHALATION_SPRAY | Freq: Four times a day (QID) | RESPIRATORY_TRACT | Status: DC | PRN

## 2019-07-15 MED ORDER — ACETAMINOPHEN 325 MG PO TABS
650.0000 mg | ORAL_TABLET | Freq: Four times a day (QID) | ORAL | Status: DC | PRN
  Administered 2019-07-15 – 2019-07-24 (×8): 650 mg via ORAL
  Filled 2019-07-15 (×8): qty 2

## 2019-07-15 MED ORDER — CHLORTHALIDONE 25 MG PO TABS
25.0000 mg | ORAL_TABLET | Freq: Every day | ORAL | Status: DC
  Administered 2019-07-16 – 2019-07-20 (×5): 25 mg via ORAL
  Filled 2019-07-15 (×5): qty 1

## 2019-07-15 MED ORDER — ACETAMINOPHEN 650 MG RE SUPP: 650.0000 mg | Freq: Four times a day (QID) | RECTAL | Status: DC | PRN

## 2019-07-15 MED ORDER — MORPHINE SULFATE (PF) 2 MG/ML IV SOLN
2.0000 mg | Freq: Once | INTRAVENOUS | Status: AC
  Administered 2019-07-15: 2 mg via INTRAVENOUS
  Filled 2019-07-15: qty 1

## 2019-07-15 MED ORDER — LIDOCAINE-EPINEPHRINE 2 %-1:100000 IJ SOLN
20.0000 mL | Freq: Once | INTRAMUSCULAR | Status: AC
  Administered 2019-07-15: 20 mL
  Filled 2019-07-15: qty 1

## 2019-07-15 MED ORDER — AMLODIPINE BESYLATE 5 MG PO TABS
5.0000 mg | ORAL_TABLET | Freq: Every day | ORAL | Status: DC
  Administered 2019-07-16: 5 mg via ORAL
  Filled 2019-07-15 (×2): qty 1

## 2019-07-15 MED ORDER — ENOXAPARIN SODIUM 40 MG/0.4ML ~~LOC~~ SOLN
40.0000 mg | SUBCUTANEOUS | Status: DC
  Administered 2019-07-15 – 2019-07-23 (×9): 40 mg via SUBCUTANEOUS
  Filled 2019-07-15 (×9): qty 0.4

## 2019-07-15 MED ORDER — CLINDAMYCIN PHOSPHATE 600 MG/50ML IV SOLN
600.0000 mg | Freq: Three times a day (TID) | INTRAVENOUS | Status: DC
  Administered 2019-07-15 – 2019-07-18 (×8): 600 mg via INTRAVENOUS
  Filled 2019-07-15 (×9): qty 50

## 2019-07-15 MED ORDER — ASPIRIN EC 81 MG PO TBEC
81.0000 mg | DELAYED_RELEASE_TABLET | Freq: Every day | ORAL | Status: DC
  Administered 2019-07-16 – 2019-07-24 (×8): 81 mg via ORAL
  Filled 2019-07-15 (×8): qty 1

## 2019-07-15 MED ORDER — METFORMIN HCL 500 MG PO TABS
1000.0000 mg | ORAL_TABLET | Freq: Two times a day (BID) | ORAL | Status: DC
  Administered 2019-07-16 – 2019-07-19 (×8): 1000 mg via ORAL
  Filled 2019-07-15 (×9): qty 2

## 2019-07-15 MED ORDER — ONDANSETRON HCL 4 MG PO TABS: 4.0000 mg | ORAL_TABLET | Freq: Four times a day (QID) | ORAL | Status: DC | PRN

## 2019-07-15 MED ORDER — ALBUTEROL SULFATE (2.5 MG/3ML) 0.083% IN NEBU: 2.5000 mg | INHALATION_SOLUTION | RESPIRATORY_TRACT | Status: DC | PRN

## 2019-07-15 MED ORDER — GUAIFENESIN ER 600 MG PO TB12
600.0000 mg | ORAL_TABLET | Freq: Two times a day (BID) | ORAL | Status: DC
  Administered 2019-07-15 – 2019-07-24 (×17): 600 mg via ORAL
  Filled 2019-07-15 (×17): qty 1

## 2019-07-15 MED ORDER — PAROXETINE HCL 20 MG PO TABS
30.0000 mg | ORAL_TABLET | Freq: Every day | ORAL | Status: DC
  Administered 2019-07-16 – 2019-07-24 (×8): 30 mg via ORAL
  Filled 2019-07-15 (×9): qty 1

## 2019-07-15 NOTE — ED Provider Notes (Signed)
84 year old female with history of CHF, COPD, on home oxygen comes in for 3-day history of possible abscess to left buttock.  She is also complaining of shortness of breath as she has ran out of home oxygen for the past week, and has not been able to obtain oxygen.  At triage, patient was febrile at 102.8, slightly tachycardic at 101, tachypneic at 24, O2 sat 84%. Her O2 increased to 90 to 92% on 2L O2,.  Patient is nontoxic in appearance, speaking in full sentences without difficulty on O2.   Discussed with patient, unable to help obtain O2, also discussed this with daughter, who would like her at the ED for full evaluation, as well as possible help with obtaining O2. Declined further exam to be able to go to the ED.    Ok Edwards, PA-C 07/15/19 1412

## 2019-07-15 NOTE — Discharge Instructions (Signed)
Patient ran out of home oxygen, having shortness of breath, found to be hypoxic at 84%. Unsure when she is able to obtain oxygen.  She is also complaining of abscess to the left hip. Was febrile at 102.8 today. However, given need to further management due to hypoxia without resources for O2, she deferred exam for full evaluation at the ED

## 2019-07-15 NOTE — ED Triage Notes (Signed)
Patient is here with abscess on left buttock that she states started 3 days ago.  Patient is also short of breath noting she is out of home O2 x one week.

## 2019-07-15 NOTE — ED Triage Notes (Signed)
She c/o being "out of my oxygen for a couple of weeks. My daughter was unable to get it for me". She also c/o shortness of breath and "not feeling well". She also mentions she has a "sore spot" on her left buttock. She is awake, alert and oriented x 4 with clear speech. She is in x ray as I write this.
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
## 2019-07-15 NOTE — ED Notes (Signed)
I have just called report to [facility] on 3rd floor--will transport shortly.
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
## 2019-07-15 NOTE — Progress Notes (Signed)
A consult was received from an ED physician for vancomycin per pharmacy dosing.  The patient's profile has been reviewed for ht/wt/allergies/indication/available labs.   A one time order has been placed for vancomycin 1gm.    Further antibiotics/pharmacy consults should be ordered by admitting physician if indicated.                       Thank you, Dolly Rias RPh 07/15/2019, 3:32 PM

## 2019-07-16 ENCOUNTER — Encounter (HOSPITAL_COMMUNITY): Payer: Self-pay | Admitting: Internal Medicine

## 2019-07-16 DIAGNOSIS — J9621 Acute and chronic respiratory failure with hypoxia: Secondary | ICD-10-CM

## 2019-07-16 DIAGNOSIS — E1169 Type 2 diabetes mellitus with other specified complication: Secondary | ICD-10-CM

## 2019-07-16 DIAGNOSIS — J438 Other emphysema: Secondary | ICD-10-CM

## 2019-07-16 LAB — CBC
HCT: 31.1 % — ABNORMAL LOW (ref 36.0–46.0)
Hemoglobin: 10.3 g/dL — ABNORMAL LOW (ref 12.0–15.0)
MCH: 28.6 pg (ref 26.0–34.0)
MCHC: 33.1 g/dL (ref 30.0–36.0)
MCV: 86.4 fL (ref 80.0–100.0)
Platelets: 170 10*3/uL (ref 150–400)
RBC: 3.6 MIL/uL — ABNORMAL LOW (ref 3.87–5.11)
RDW: 16 % — ABNORMAL HIGH (ref 11.5–15.5)
WBC: 16.9 10*3/uL — ABNORMAL HIGH (ref 4.0–10.5)
nRBC: 0.1 % (ref 0.0–0.2)

## 2019-07-16 LAB — HEMOGLOBIN A1C
Hgb A1c MFr Bld: 9.2 % — ABNORMAL HIGH (ref 4.8–5.6)
Mean Plasma Glucose: 217.34 mg/dL

## 2019-07-16 LAB — COMPREHENSIVE METABOLIC PANEL
ALT: 14 U/L (ref 0–44)
AST: 12 U/L — ABNORMAL LOW (ref 15–41)
Albumin: 3 g/dL — ABNORMAL LOW (ref 3.5–5.0)
Alkaline Phosphatase: 75 U/L (ref 38–126)
Anion gap: 11 (ref 5–15)
BUN: 22 mg/dL (ref 8–23)
CO2: 22 mmol/L (ref 22–32)
Calcium: 9.5 mg/dL (ref 8.9–10.3)
Chloride: 105 mmol/L (ref 98–111)
Creatinine, Ser: 1.14 mg/dL — ABNORMAL HIGH (ref 0.44–1.00)
GFR calc Af Amer: 52 mL/min — ABNORMAL LOW (ref >60)
GFR calc non Af Amer: 45 mL/min — ABNORMAL LOW (ref >60)
Glucose, Bld: 172 mg/dL — ABNORMAL HIGH (ref 70–99)
Potassium: 4.6 mmol/L (ref 3.5–5.1)
Sodium: 138 mmol/L (ref 135–145)
Total Bilirubin: 1.1 mg/dL (ref 0.3–1.2)
Total Protein: 6.4 g/dL — ABNORMAL LOW (ref 6.5–8.1)

## 2019-07-16 LAB — GLUCOSE, CAPILLARY
Glucose-Capillary: 168 mg/dL — ABNORMAL HIGH (ref 70–99)
Glucose-Capillary: 239 mg/dL — ABNORMAL HIGH (ref 70–99)
Glucose-Capillary: 256 mg/dL — ABNORMAL HIGH (ref 70–99)
Glucose-Capillary: 140 mg/dL — ABNORMAL HIGH (ref 70–99)

## 2019-07-16 LAB — HIV ANTIBODY (ROUTINE TESTING W REFLEX): HIV Screen 4th Generation wRfx: NONREACTIVE

## 2019-07-16 NOTE — Plan of Care (Signed)
  Problem: Education: Goal: Knowledge of General Education information will improve Description: Including pain rating scale, medication(s)/side effects and non-pharmacologic comfort measures Outcome: Progressing   Problem: Clinical Measurements: Goal: Ability to maintain clinical measurements within normal limits will improve Outcome: Progressing Goal: Respiratory complications will improve Outcome: Progressing   Problem: Activity: Goal: Risk for activity intolerance will decrease Outcome: Progressing   Problem: Pain Managment: Goal: General experience of comfort will improve Outcome: Progressing   Problem: Safety: Goal: Ability to remain free from injury will improve Outcome: Progressing

## 2019-07-16 NOTE — Progress Notes (Signed)
PROGRESS NOTE    Caitlyn Aguirre  STM:[id number] DOB: Jan 20, 1936 DOA: 07/15/2019 PCP: Axel Filler, MD   Brief Narrative:   Caitlyn Aguirre is a 84 y.o. female with medical history significant of DM2, COPD, chronic respiratory failure on 2L [place] at home. Presents with 3 days of left butt pain. She noticed soreness on Thursday. She denies any falls or physical injury to the area. She denies any alleviating factors. Putting pressure on the area worsens it. The pain in constant and sharp when pressed, but otherwise throbbing. She was seen at urgent care today for this issue and they recommended that she come to the ED. Of note, she has also been dyspnea. She denies any fevers or sick contacts. She states she has been out of her home oxygen for a week d/t difficulty with getting HH to replace her tanks.  6/13: She states that she is feeling better today. WBC improved. Still febrile. Will continue IV abx until temp curve improves.    Assessment & Plan:   Active Problems:   Cellulitis of buttock, left  Sepsis secondary to cellulitis  Non-purulent cellulitis of left buttock     - admit to inpatient     - fever, tachycardia, elevated WBC and source at admission     - cellulitis order set: start clindamycin     - Bld Cx pending     - EDPA checked [place]; no abscess seen     - pain control w/ APAP and norco     - 6/13: continue clindamycin; WBC down, area of induration is a little better and no fluctuance felt  COPD Acute on chronic hypoxia respiratory failure     - she has been out of her oxygen at home for about a week; normal usage is 2L     - dtr reports difficulty with getting HH to fill tank; will ask TOC to assist     - she is now on 2L O2 and comfortable     - continue PRN albuterol     - 6/13: states she is at her baseline respiratory status  DM2     - last A1c was 7.1.     - she is on metformin at home; continue     - CBG, SSI and carb-mod diet ordered     - 6/13: continue  as above.   CKD 3b     - looks like she is at her baseline creatinine     - watch nephrotoxins     - 6/13: SCr is down to 1.14  HTN     - continue chlorthalidone, coreg and norvasc     - 6/13: BP is ok; softish, still holding lisinopril; follow  Hypothyroidism     - continue synthroid  Hx of polymyalgia rheumatica     - currently on a long steroid taper started by her PCP; per his note, she should be on 17.5 mg prednisone through 7/4. Resume prednisone.  DVT prophylaxis: lovenox Code Status: FULL Family Communication: Spoke with dtr by phone.    Status is: Inpatient  Remains inpatient appropriate because:IV treatments appropriate due to intensity of illness or inability to take PO   Dispo: The patient is from: Home              Anticipated d/c is to: Home              Anticipated d/c date is: 3 days  Patient currently is not medically stable to d/c.  Consultants:   None  Procedures:   None  Antimicrobials:  . clindamycin   ROS:  Reports butt pain. Denies CP, dyspnea, N, V . Remainder 10-pt ROS is negative for all not previously mentioned.  Subjective: "Oh, it's getting better."  Objective: Vitals:   07/15/19 2000 07/15/19 2139 07/16/19 0252 07/16/19 0655  BP: 129/60 123/70 (!) 139/58 138/60  Pulse: 89 (!) 104 90 90  Resp: 16 16 16 16   Temp: (!) 101.2 F (38.4 C) 99.6 F (37.6 C) 98.7 F (37.1 C) (!) 101.4 F (38.6 C)  TempSrc: Oral Oral Oral Oral  SpO2: 94% 95% 95% 95%  Weight:      Height:        Intake/Output Summary (Last 24 hours) at 07/16/2019 0726 Last data filed at 07/16/2019 0656 Gross per 24 hour  Intake 520.1 ml  Output 1300 ml  Net -779.9 ml   Filed Weights   07/15/19 1626  Weight: 93.4 kg    Examination:  General: 84 y.o. female resting in bed in NAD Cardiovascular: RRR, +S1, S2, no m/g/r, equal pulses throughout Respiratory: CTABL, no w/r/r, normal WOB GI: BS+, NDNT, no masses noted, no organomegaly  noted MSK: No e/c/c; left medial buttock cellulitis/induration Neuro: A&O x 3, no focal deficits Psyc: Appropriate interaction and affect, calm/cooperative   Data Reviewed: I have personally reviewed following labs and imaging studies.  CBC: Recent Labs  Lab 07/15/19 1519 07/16/19 0437  WBC 18.9* 16.9*  NEUTROABS 14.6*  --   HGB 11.2* 10.3*  HCT 34.8* 31.1*  MCV 87.7 86.4  PLT 187 242   Basic Metabolic Panel: Recent Labs  Lab 07/15/19 1519 07/16/19 0437  NA 137 138  K 4.9 4.6  CL 102 105  CO2 23 22  GLUCOSE 187* 172*  BUN 26* 22  CREATININE 1.37* 1.14*  CALCIUM 9.9 9.5   GFR: Estimated Creatinine Clearance: 45.5 mL/min (A) (by C-G formula based on SCr of 1.14 mg/dL (H)). Liver Function Tests: Recent Labs  Lab 07/15/19 1519 07/16/19 0437  AST 12* 12*  ALT 15 14  ALKPHOS 87 75  BILITOT 1.3* 1.1  PROT 7.2 6.4*  ALBUMIN 3.4* 3.0*   No results for input(s): LIPASE, AMYLASE in the last 168 hours. No results for input(s): AMMONIA in the last 168 hours. Coagulation Profile: Recent Labs  Lab 07/15/19 1519  INR 1.1   Cardiac Enzymes: No results for input(s): CKTOTAL, CKMB, CKMBINDEX, TROPONINI in the last 168 hours. BNP (last 3 results) No results for input(s): PROBNP in the last 8760 hours. HbA1C: No results for input(s): HGBA1C in the last 72 hours. CBG: Recent Labs  Lab 07/15/19 2006  GLUCAP 179*   Lipid Profile: No results for input(s): CHOL, HDL, LDLCALC, TRIG, CHOLHDL, LDLDIRECT in the last 72 hours. Thyroid Function Tests: No results for input(s): TSH, T4TOTAL, FREET4, T3FREE, THYROIDAB in the last 72 hours. Anemia Panel: No results for input(s): VITAMINB12, FOLATE, FERRITIN, TIBC, IRON, RETICCTPCT in the last 72 hours. Sepsis Labs: Recent Labs  Lab 07/15/19 1519  LATICACIDVEN 1.8    Recent Results (from the past 240 hour(s))  Culture, blood (Routine x 2)     Status: None (Preliminary result)   Collection Time: 07/15/19  3:19 PM    Specimen: BLOOD LEFT FOREARM  Result Value Ref Range Status   Specimen Description   Final    BLOOD LEFT FOREARM Performed at [facility], [place] [address] [place],  [place] [address]    Special Requests   Final    BOTTLES DRAWN AEROBIC AND ANAEROBIC Blood Culture adequate volume Performed at [facility] [address] [place], [place] [address]    Culture   Final    NO GROWTH < 12 HOURS Performed at [facility] [address] [place], [place] [address]    Report Status PENDING  Incomplete  Culture, blood (Routine x 2)     Status: None (Preliminary result)   Collection Time: 07/15/19  3:24 PM   Specimen: BLOOD  Result Value Ref Range Status   Specimen Description   Final    BLOOD RIGHT ANTECUBITAL Performed at [facility], [place] [address] [place], [place] [address]    Special Requests   Final    BOTTLES DRAWN AEROBIC AND ANAEROBIC Blood Culture results may not be optimal due to an excessive volume of blood received in culture bottles Performed at [facility] [address], [place], [place] [address]    Culture   Final    NO GROWTH < 12 HOURS Performed at [facility] [address], [place], [place] [address]    Report Status PENDING  Incomplete  SARS Coronavirus 2 by RT PCR (hospital order, performed in [facility] hospital lab) Nasopharyngeal Nasopharyngeal Swab     Status: None   Collection Time: 07/15/19  4:52 PM   Specimen: Nasopharyngeal Swab  Result Value Ref Range Status   SARS Coronavirus 2 NEGATIVE NEGATIVE Final    Comment: (NOTE) SARS-CoV-2 target nucleic acids are NOT DETECTED.  The SARS-CoV-2 RNA is generally detectable in upper and lower respiratory specimens during the acute phase of infection. The lowest concentration of SARS-CoV-2 viral copies this assay can detect is 250 copies / mL. A negative result does not preclude SARS-CoV-2 infection and should not be used as the  sole basis for treatment or other patient management decisions.  A negative result may occur with improper specimen collection / handling, submission of specimen other than nasopharyngeal swab, presence of viral mutation(s) within the areas targeted by this assay, and inadequate number of viral copies (<250 copies / mL). A negative result must be combined with clinical observations, patient history, and epidemiological information.  Fact Sheet for Patients:   StrictlyIdeas.no  Fact Sheet for Healthcare Providers: BankingDealers.co.za  This test is not yet approved or  cleared by the [place] FDA and has been authorized for detection and/or diagnosis of SARS-CoV-2 by FDA under an Emergency Use Authorization (EUA).  This EUA will remain in effect (meaning this test can be used) for the duration of the COVID-19 declaration under Section 564(b)(1) of the Act, 21 U.S.C. section 360bbb-3(b)(1), unless the authorization is terminated or revoked sooner.  Performed at [facility], [place] [address], [place], [place] [address]       Radiology Studies: DG Chest 2 View  Result Date: 07/15/2019 CLINICAL DATA:  Sepsis. EXAM: CHEST - 2 VIEW COMPARISON:  Chest x-ray dated Jun 11, 2019. FINDINGS: The heart size and mediastinal contours are within normal limits. Normal pulmonary vascularity. Chronic bibasilar scarring and bronchiectasis. No focal consolidation, pleural effusion, or pneumothorax. No acute osseous abnormality. IMPRESSION: No active cardiopulmonary disease. Electronically Signed   By: Titus Dubin M.D.   On: 07/15/2019 15:55     Scheduled Meds: . amLODipine  5 mg Oral Daily  . aspirin EC  81 mg Oral Daily  . atorvastatin  40 mg Oral  q1800  . carvedilol  25 mg Oral BID WC  . chlorthalidone  25 mg Oral Daily  . enoxaparin (LOVENOX) injection  40 mg Subcutaneous Q24H  . guaiFENesin  600 mg Oral BID  . insulin  aspart  0-5 Units Subcutaneous QHS  . insulin aspart  0-9 Units Subcutaneous TID WC  . latanoprost  1 drop Both Eyes QHS  . levothyroxine  88 mcg Oral Q0600  . metFORMIN  1,000 mg Oral BID WC  . PARoxetine  30 mg Oral Daily  . predniSONE  17.5 mg Oral Q breakfast   Continuous Infusions: . clindamycin (CLEOCIN) IV 600 mg (07/16/19 0500)     LOS: 1 day    Time spent: 25 minutes spent in the coordination of care today.    Jonnie Finner, DO [facility]  If 7PM-7AM, please contact night-coverage [email or web address] 07/16/2019, 7:26 AM

## 2019-07-17 ENCOUNTER — Inpatient Hospital Stay (HOSPITAL_COMMUNITY): Payer: Medicare Other

## 2019-07-17 LAB — GLUCOSE, CAPILLARY
Glucose-Capillary: 113 mg/dL — ABNORMAL HIGH (ref 70–99)
Glucose-Capillary: 241 mg/dL — ABNORMAL HIGH (ref 70–99)
Glucose-Capillary: 232 mg/dL — ABNORMAL HIGH (ref 70–99)
Glucose-Capillary: 134 mg/dL — ABNORMAL HIGH (ref 70–99)

## 2019-07-17 LAB — COMPREHENSIVE METABOLIC PANEL
ALT: 15 U/L (ref 0–44)
AST: 18 U/L (ref 15–41)
Albumin: 3 g/dL — ABNORMAL LOW (ref 3.5–5.0)
Alkaline Phosphatase: 74 U/L (ref 38–126)
Anion gap: 11 (ref 5–15)
BUN: 31 mg/dL — ABNORMAL HIGH (ref 8–23)
CO2: 24 mmol/L (ref 22–32)
Calcium: 9.8 mg/dL (ref 8.9–10.3)
Chloride: 105 mmol/L (ref 98–111)
Creatinine, Ser: 1.41 mg/dL — ABNORMAL HIGH (ref 0.44–1.00)
GFR calc Af Amer: 40 mL/min — ABNORMAL LOW (ref >60)
GFR calc non Af Amer: 35 mL/min — ABNORMAL LOW (ref >60)
Glucose, Bld: 105 mg/dL — ABNORMAL HIGH (ref 70–99)
Potassium: 4.7 mmol/L (ref 3.5–5.1)
Sodium: 140 mmol/L (ref 135–145)
Total Bilirubin: 1.1 mg/dL (ref 0.3–1.2)
Total Protein: 6.6 g/dL (ref 6.5–8.1)

## 2019-07-17 LAB — CBC WITH DIFFERENTIAL/PLATELET
Abs Immature Granulocytes: 0.13 10*3/uL — ABNORMAL HIGH (ref 0.00–0.07)
Basophils Absolute: 0 10*3/uL (ref 0.0–0.1)
Basophils Relative: 0 %
Eosinophils Absolute: 0.1 10*3/uL (ref 0.0–0.5)
Eosinophils Relative: 1 %
HCT: 29.5 % — ABNORMAL LOW (ref 36.0–46.0)
Hemoglobin: 9.9 g/dL — ABNORMAL LOW (ref 12.0–15.0)
Immature Granulocytes: 1 %
Lymphocytes Relative: 7 %
Lymphs Abs: 1 10*3/uL (ref 0.7–4.0)
MCH: 28.6 pg (ref 26.0–34.0)
MCHC: 33.6 g/dL (ref 30.0–36.0)
MCV: 85.3 fL (ref 80.0–100.0)
Monocytes Absolute: 1.3 10*3/uL — ABNORMAL HIGH (ref 0.1–1.0)
Monocytes Relative: 9 %
Neutro Abs: 11.3 10*3/uL — ABNORMAL HIGH (ref 1.7–7.7)
Neutrophils Relative %: 82 %
Platelets: 173 10*3/uL (ref 150–400)
RBC: 3.46 MIL/uL — ABNORMAL LOW (ref 3.87–5.11)
RDW: 15.9 % — ABNORMAL HIGH (ref 11.5–15.5)
WBC: 13.8 10*3/uL — ABNORMAL HIGH (ref 4.0–10.5)
nRBC: 0 % (ref 0.0–0.2)

## 2019-07-17 LAB — URINE CULTURE

## 2019-07-17 LAB — MAGNESIUM: Magnesium: 1.4 mg/dL — ABNORMAL LOW (ref 1.7–2.4)

## 2019-07-17 MED ORDER — MAGNESIUM SULFATE 2 GM/50ML IV SOLN
2.0000 g | Freq: Once | INTRAVENOUS | Status: AC
  Administered 2019-07-17: 2 g via INTRAVENOUS
  Filled 2019-07-17: qty 50

## 2019-07-17 MED ORDER — OXYCODONE HCL 5 MG PO TABS
5.0000 mg | ORAL_TABLET | Freq: Four times a day (QID) | ORAL | Status: DC | PRN
  Administered 2019-07-17: 5 mg via ORAL
  Filled 2019-07-17 (×2): qty 1

## 2019-07-17 MED ORDER — OXYCODONE HCL 5 MG PO TABS
5.0000 mg | ORAL_TABLET | ORAL | Status: DC | PRN
  Administered 2019-07-17 – 2019-07-21 (×12): 5 mg via ORAL
  Filled 2019-07-17 (×11): qty 1

## 2019-07-17 NOTE — TOC Initial Note (Addendum)
Transition of Care [facility]) - Initial/Assessment Note    Patient Details  Name: PAQUITA PRINTY MRN: [id number] Date of Birth: May 20, 1935  Transition of Care [facility]) CM/SW Contact:    Gaetano Hawthorne Tarpley-Carter, [place] Phone Number: 07/17/2019, 2:27 PM  Clinical Narrative:                 CSW meet with Ms. Ronnald Ramp and Mardene Celeste (daughter).  To discuss discharge planning home.  Patient reports she has oxygen at home, but no longer has oxygen.  She expressed she owes $200 for outstanding bill.  Patient daughter expressed concern around using oxygen and knowing when the patient is out.  She has requested that someone come out to the home to educate them both on how to use the oxygen properly.  Patient reports she does not have a home health agency.  Daughter assists mother with care.  Patient reports she is ambulatory with a cane, but she would be better equipped with a walker with a seat.  Patient is independent with bathing and dressing.    CSW reached out to DME agency [employer] and spoke to [facility] about the patients oxygen.  He reports there is no record of patient and mother calling about oxygen tank being replaced.  He states the patient last contact was in April in reference to an outstanding bill.  Thedore Mins will follow up with CSW in reference to patients oxygen matters.  CSW will notify the physician of patient request for DME.  TOC staff will continue to follow patient for discharge needs.  Expected Discharge Plan: Home/Self Care Barriers to Discharge: Continued Medical Work up   Patient Goals and CMS Choice Patient states their goals for this hospitalization and ongoing recovery are:: Patient wants to stablize and get oxygen at home.      Expected Discharge Plan and Services Expected Discharge Plan: Home/Self Care In-house Referral: Clinical Social Work Discharge Planning Services: CM Consult Post Acute Care Choice: Home Health, Durable Medical Equipment Living arrangements for the  past 2 months: [place]                 DME Arranged: Walker rolling with seat, Oxygen DME Agency: [employer] Date DME Agency Contacted: 07/17/19 Time DME Agency Contacted: [phone] Representative spoke with at DME Agency: Andree Coss            Prior Living Arrangements/Services Living arrangements for the past 2 months: [place] with:: Adult Children Patient language and need for interpreter reviewed:: No Do you feel safe going back to the place where you live?: Yes      Need for Family Participation in Patient Care: Yes (Comment) Care giver support system in place?: Yes (comment) Current home services: DME, Other (comment) ([facility]) Criminal Activity/Legal Involvement Pertinent to Current Situation/Hospitalization: No - Comment as needed  Activities of Daily Living Home Assistive Devices/Equipment: Gilford Rile (specify type) ADL Screening (condition at time of admission) Patient's cognitive ability adequate to safely complete daily activities?: Yes Is the patient deaf or have difficulty hearing?: No Does the patient have difficulty seeing, even when wearing glasses/contacts?: Yes Does the patient have difficulty concentrating, remembering, or making decisions?: No Patient able to express need for assistance with ADLs?: Yes Does the patient have difficulty dressing or bathing?: No Independently performs ADLs?: Yes (appropriate for developmental age) Does the patient have difficulty walking or climbing stairs?: Yes Weakness of Legs: Both Weakness of Arms/Hands: None  Permission Sought/Granted Permission sought  to share information with : Family Supports Permission granted to share information with : Yes, Verbal Permission Granted  Share Information with NAME: Evie Lacks     Permission granted to share info w Relationship: Daughter  Permission granted to share info w Contact Information:  [phone]  Emotional Assessment Appearance:: Well-Groomed, Appears stated age Attitude/Demeanor/Rapport: Engaged Affect (typically observed): Appropriate, Accepting, Pleasant Orientation: : Oriented to Self, Oriented to Place, Oriented to  Time, Oriented to Situation Alcohol / Substance Use: Not Applicable Psych Involvement: No (comment)  Admission diagnosis:  Cellulitis of left buttock [L03.317] Cellulitis of buttock, left [L03.317] Sepsis, due to unspecified organism, unspecified whether acute organ dysfunction present [facility]) [A41.9] Patient Active Problem List   Diagnosis Date Noted  . Acute on chronic respiratory failure with hypoxia ([place]) 07/16/2019  . Cellulitis of buttock, left 07/15/2019  . CKD (chronic kidney disease) stage 3, GFR 30-59 ml/min 05/08/2019  . High Risk for Falls 07/18/2015  . GERD (gastroesophageal reflux disease) 06/16/2015  . Urinary, incontinence, stress female 05/09/2015  . Insomnia 05/09/2015  . Eczema of hand 06/21/2013  . Preventive measure 02/17/2013  . Obstructive sleep apnea 12/25/2011  . Hypothyroidism 01/21/2006  . Depression 01/21/2006  . Essential hypertension 01/21/2006  . COPD ([place]) 01/21/2006  . Osteoarthritis 01/21/2006  . Polymyalgia rheumatica ([place]) 01/21/2006  . Type 2 diabetes mellitus with other specified complication ([place]) 68/34/1962   PCP:  Axel Filler, MD Pharmacy:   [facility] [place], [address] [place] [address] Phone: [phone] Fax: [phone]     Social Determinants of Health (SDOH) Interventions    Readmission Risk Interventions No flowsheet data found.

## 2019-07-17 NOTE — TOC Progression Note (Signed)
Transition of Care [facility]) - Progression Note    Patient Details  Name: AME HEAGLE MRN: [id number] Date of Birth: [birth year]  Transition of [facility]) CM/SW Contact  Danijela Vessey C Tarpley-Carter, [place] Phone Number: 07/17/2019, 3:43 PM  Clinical Narrative:    CSW followed up with [employer].  Patient will have to be setup with a payment plan before receiving  a rolling walker with a seat.  CSW spoke with patients daughter, Mardene Celeste.  Mardene Celeste was made aware of the payment plan discussed with [employer].  Mardene Celeste stated she would follow up today in regards to payment plan.   Expected Discharge Plan: Home/Self Care Barriers to Discharge: Continued Medical Work up  Expected Discharge Plan and Services Expected Discharge Plan: Home/Self Care In-house Referral: Clinical Social Work Discharge Planning Services: CM Consult Post Acute Care Choice: Home Health, Durable Medical Equipment Living arrangements for the past 2 months: Single Family Home                 DME Arranged: Walker rolling with seat, Oxygen DME Agency: [employer] Date DME Agency Contacted: 07/17/19 Time DME Agency Contacted: [phone] Representative spoke with at DME Agency: [place] ([place]) Interventions    Readmission Risk Interventions No flowsheet data found.

## 2019-07-17 NOTE — TOC Initial Note (Signed)
Transition of Care [facility]) - Initial/Assessment Note    Patient Details  Name: Caitlyn Aguirre MRN: [id number] Date of Birth: [birth year]  Transition of Care [facility]) CM/SW Contact:    Jawara Latorre C Tarpley-Carter, [place] Phone Number: 07/17/2019, 2:38 PM  Clinical Narrative:                   Expected Discharge Plan: Home/Self Care Barriers to Discharge: Continued Medical Work up   Patient Goals and CMS Choice Patient states their goals for this hospitalization and ongoing recovery are:: Patient wants to stablize and get oxygen at home.      Expected Discharge Plan and Services Expected Discharge Plan: Home/Self Care In-house Referral: Clinical Social Work Discharge Planning Services: CM Consult Post Acute Care Choice: Home Health, Durable Medical Equipment Living arrangements for the past 2 months: Single Family Home                 DME Arranged: Walker rolling with seat, Oxygen DME Agency: [employer] Date DME Agency Contacted: 07/17/19 Time DME Agency Contacted: [phone] Representative spoke with at DME Agency: Andree Coss            Prior Living Arrangements/Services Living arrangements for the past 2 months: [place] with:: Adult Children Patient language and need for interpreter reviewed:: No Do you feel safe going back to the place where you live?: Yes      Need for Family Participation in Patient Care: Yes (Comment) Care giver support system in place?: Yes (comment) Current home services: DME, Other (comment) ([facility]) Criminal Activity/Legal Involvement Pertinent to Current Situation/Hospitalization: No - Comment as needed  Activities of Daily Living Home Assistive Devices/Equipment: Walker (specify type) ADL Screening (condition at time of admission) Patient's cognitive ability adequate to safely complete daily activities?: Yes Is the patient deaf or have difficulty hearing?: No Does the patient have  difficulty seeing, even when wearing glasses/contacts?: Yes Does the patient have difficulty concentrating, remembering, or making decisions?: No Patient able to express need for assistance with ADLs?: Yes Does the patient have difficulty dressing or bathing?: No Independently performs ADLs?: Yes (appropriate for developmental age) Does the patient have difficulty walking or climbing stairs?: Yes Weakness of Legs: Both Weakness of Arms/Hands: None  Permission Sought/Granted Permission sought to share information with : Family Supports Permission granted to share information with : Yes, Verbal Permission Granted  Share Information with NAME: Evie Lacks     Permission granted to share info w Relationship: Daughter  Permission granted to share info w Contact Information: [phone]  Emotional Assessment Appearance:: Well-Groomed, Appears stated age Attitude/Demeanor/Rapport: Engaged Affect (typically observed): Appropriate, Accepting, Pleasant Orientation: : Oriented to Self, Oriented to Place, Oriented to  Time, Oriented to Situation Alcohol / Substance Use: Not Applicable Psych Involvement: No (comment)  Admission diagnosis:  Cellulitis of left buttock [L03.317] Cellulitis of buttock, left [L03.317] Sepsis, due to unspecified organism, unspecified whether acute organ dysfunction present [facility]) [A41.9] Patient Active Problem List   Diagnosis Date Noted  . Acute on chronic respiratory failure with hypoxia ([place]) 07/16/2019  . Cellulitis of buttock, left 07/15/2019  . CKD (chronic kidney disease) stage 3, GFR 30-59 ml/min 05/08/2019  . High Risk for Falls 07/18/2015  . GERD (gastroesophageal reflux disease) 06/16/2015  . Urinary, incontinence, stress female 05/09/2015  . Insomnia 05/09/2015  . Eczema of hand 06/21/2013  . Preventive measure 02/17/2013  . Obstructive sleep apnea 12/25/2011  . Hypothyroidism 01/21/2006  . Depression 01/21/2006  .  Essential hypertension  01/21/2006  . COPD ([place]) 01/21/2006  . Osteoarthritis 01/21/2006  . Polymyalgia rheumatica ([place]) 01/21/2006  . Type 2 diabetes mellitus with other specified complication ([place]) 22/56/7209   PCP:  Axel Filler, MD Pharmacy:   [facility] [place], [address] [place] [address] Phone: [phone] Fax: [phone]     Social Determinants of Health (SDOH) Interventions    Readmission Risk Interventions No flowsheet data found.

## 2019-07-17 NOTE — Progress Notes (Signed)
PROGRESS NOTE    GENEIVE SANDSTROM  RCV:[id number] DOB: 02/08/1935 DOA: 07/15/2019 PCP: Axel Filler, MD   Brief Narrative:   Kaliann Coryell Jonesis a 84 y.o.femalewith medical history significant ofDM2, COPD, chronic respiratory failure on 2L [place] at home. Presents with 3 days of left butt pain. She noticed soreness on Thursday. She denies any falls or physical injury to the area. She denies any alleviating factors. Putting pressure on the area worsens it. The pain in constant and sharp when pressed, but otherwise throbbing. She was seen at urgent care today for this issue and they recommended that she come to the ED. Of note, she has also been dyspnea. She denies any fevers or sick contacts. She states she has been out of her home oxygen for a week d/t difficulty with getting HH to replace her tanks.  6/13: She states that she is feeling better today. WBC improved. Still febrile. Will continue IV abx until temp curve improves.   6/14: Labs improved. No fevers. She is having a little more pain back there and the area has both shrunk and become more indurated. Let's repeat and [place]. Continue IV abx for now. Updated dtr w/ plan.   Assessment & Plan:   Active Problems:   Hypothyroidism   Type 2 diabetes mellitus with other specified complication (HCC)   Essential hypertension   COPD ([place])   Polymyalgia rheumatica (HCC)   CKD (chronic kidney disease) stage 3, GFR 30-59 ml/min   Cellulitis of buttock, left   Acute on chronic respiratory failure with hypoxia (HCC)  Sepsis secondary to cellulitis  Non-purulent cellulitis of left buttock - admit to inpatient - fever, tachycardia, elevated WBC and source at admission - cellulitis order set: start clindamycin - Bld Cx pending - EDPA checked [place]; no abscess seen - pain control w/ APAP and norco     - 6/13: continue clindamycin; WBC down, area of induration is a little better and no fluctuance felt     - 6/14: afebrile.  WBC trending. Area is getting smaller but more indurated. Repeat US.   COPD Acute on chronic hypoxia respiratory failure - she has been out of her oxygen at home for about a week; normal usage is 2L - dtr reports difficulty with getting HH to fill tank; will ask TOC to assist - she is now on 2L O2 and comfortable - continue PRN albuterol     - 6/13: states she is at her baseline respiratory status  DM2 - last A1c was 7.1. - she is on metformin at home; continue - CBG, SSI and carb-mod diet ordered     - 6/13: continue as above.   CKD 3b - looks like she is at her baseline creatinine - watch nephrotoxins  HTN - continue chlorthalidone, coreg and norvasc  Hypothyroidism - continue synthroid  Hx of polymyalgia rheumatica - currently on a long steroid taper started by her PCP; per his note, she should be on 17.5 mg prednisone through 7/4. Resume prednisone.  DVT prophylaxis: lovenox Code Status: FULL Family Communication: Spoke with dtr by phone   Status is: Inpatient  Remains inpatient appropriate because:Inpatient level of care appropriate due to severity of illness   Dispo: The patient is from: Home              Anticipated d/c is to: Home              Anticipated d/c date is: 2 days  Patient currently is not medically stable to d/c.  Antimicrobials:  . clindamycin   ROS:  Reports butt pain. Denies CP, dyspnea, N, V . Remainder 10-pt ROS is negative for all not previously mentioned.  Subjective: "It's just so sore."  Objective: Vitals:   07/16/19 1755 07/16/19 2103 07/17/19 0123 07/17/19 0514  BP: 101/60 (!) 112/56 122/85 126/81  Pulse: 72 79 78 88  Resp:  17  18  Temp:  99 F (37.2 C) 97.8 F (36.6 C) 98.6 F (37 C)  TempSrc:  Oral Oral Oral  SpO2: 100% 94% 97% 99%  Weight:      Height:        Intake/Output Summary (Last 24 hours) at 07/17/2019 0732 Last data filed at 07/17/2019  0200 Gross per 24 hour  Intake 1010.07 ml  Output 300 ml  Net 710.07 ml   Filed Weights   07/15/19 1626  Weight: 93.4 kg    Examination:  General: 84 y.o. female resting in bed in NAD Cardiovascular: RRR, +S1, S2, no m/g/r, equal pulses throughout Respiratory: CTABL, no w/r/r, normal WOB, on 2L [place] GI: BS+, NDNT, no masses noted, no organomegaly noted MSK: No e/c/c, left gluteal cellulitis w/ area of induration Neuro: A&O x 3, no focal deficits Psyc: Appropriate interaction and affect, calm/cooperative  Data Reviewed: I have personally reviewed following labs and imaging studies.  CBC: Recent Labs  Lab 07/15/19 1519 07/16/19 0437 07/17/19 0455  WBC 18.9* 16.9* 13.8*  NEUTROABS 14.6*  --  11.3*  HGB 11.2* 10.3* 9.9*  HCT 34.8* 31.1* 29.5*  MCV 87.7 86.4 85.3  PLT 187 170 846   Basic Metabolic Panel: Recent Labs  Lab 07/15/19 1519 07/16/19 0437 07/17/19 0455  NA 137 138 140  K 4.9 4.6 4.7  CL 102 105 105  CO2 23 22 24   GLUCOSE 187* 172* 105*  BUN 26* 22 31*  CREATININE 1.37* 1.14* 1.41*  CALCIUM 9.9 9.5 9.8  MG  --   --  1.4*   GFR: Estimated Creatinine Clearance: 36.8 mL/min (A) (by C-G formula based on SCr of 1.41 mg/dL (H)). Liver Function Tests: Recent Labs  Lab 07/15/19 1519 07/16/19 0437 07/17/19 0455  AST 12* 12* 18  ALT 15 14 15   ALKPHOS 87 75 74  BILITOT 1.3* 1.1 1.1  PROT 7.2 6.4* 6.6  ALBUMIN 3.4* 3.0* 3.0*   No results for input(s): LIPASE, AMYLASE in the last 168 hours. No results for input(s): AMMONIA in the last 168 hours. Coagulation Profile: Recent Labs  Lab 07/15/19 1519  INR 1.1   Cardiac Enzymes: No results for input(s): CKTOTAL, CKMB, CKMBINDEX, TROPONINI in the last 168 hours. BNP (last 3 results) No results for input(s): PROBNP in the last 8760 hours. HbA1C: Recent Labs    07/16/19 0437  HGBA1C 9.2*   CBG: Recent Labs  Lab 07/15/19 2006 07/16/19 0743 07/16/19 1225 07/16/19 1637 07/16/19 2329  GLUCAP 179*  168* 239* 256* 140*   Lipid Profile: No results for input(s): CHOL, HDL, LDLCALC, TRIG, CHOLHDL, LDLDIRECT in the last 72 hours. Thyroid Function Tests: No results for input(s): TSH, T4TOTAL, FREET4, T3FREE, THYROIDAB in the last 72 hours. Anemia Panel: No results for input(s): VITAMINB12, FOLATE, FERRITIN, TIBC, IRON, RETICCTPCT in the last 72 hours. Sepsis Labs: Recent Labs  Lab 07/15/19 1519  LATICACIDVEN 1.8    Recent Results (from the past 240 hour(s))  Culture, blood (Routine x 2)     Status: None (Preliminary result)   Collection Time: 07/15/19  3:19 PM   Specimen: BLOOD LEFT FOREARM  Result Value Ref Range Status   Specimen Description   Final    BLOOD LEFT FOREARM Performed at [facility] [address], [place], [place] [address]    Special Requests   Final    BOTTLES DRAWN AEROBIC AND ANAEROBIC Blood Culture adequate volume Performed at [facility] [address], [place], [place] [address]    Culture   Final    NO GROWTH < 24 HOURS Performed at [facility] [address] [place], [place] [address]    Report Status PENDING  Incomplete  Culture, blood (Routine x 2)     Status: None (Preliminary result)   Collection Time: 07/15/19  3:24 PM   Specimen: BLOOD  Result Value Ref Range Status   Specimen Description   Final    BLOOD RIGHT ANTECUBITAL Performed at [facility], [place] [address], [place], [place] [address]    Special Requests   Final    BOTTLES DRAWN AEROBIC AND ANAEROBIC Blood Culture results may not be optimal due to an excessive volume of blood received in culture bottles Performed at [facility] [address] [place], [place] [address]    Culture   Final    NO GROWTH < 24 HOURS Performed at [facility] [address], [place], [place] [address]    Report Status PENDING  Incomplete  SARS Coronavirus 2 by RT PCR (hospital order, performed in [facility] hospital  lab) Nasopharyngeal Nasopharyngeal Swab     Status: None   Collection Time: 07/15/19  4:52 PM   Specimen: Nasopharyngeal Swab  Result Value Ref Range Status   SARS Coronavirus 2 NEGATIVE NEGATIVE Final    Comment: (NOTE) SARS-CoV-2 target nucleic acids are NOT DETECTED.  The SARS-CoV-2 RNA is generally detectable in upper and lower respiratory specimens during the acute phase of infection. The lowest concentration of SARS-CoV-2 viral copies this assay can detect is 250 copies / mL. A negative result does not preclude SARS-CoV-2 infection and should not be used as the sole basis for treatment or other patient management decisions.  A negative result may occur with improper specimen collection / handling, submission of specimen other than nasopharyngeal swab, presence of viral mutation(s) within the areas targeted by this assay, and inadequate number of viral copies (<250 copies / mL). A negative result must be combined with clinical observations, patient history, and epidemiological information.  Fact Sheet for Patients:   StrictlyIdeas.no  Fact Sheet for Healthcare Providers: BankingDealers.co.za  This test is not yet approved or  cleared by the [place] FDA and has been authorized for detection and/or diagnosis of SARS-CoV-2 by FDA under an Emergency Use Authorization (EUA).  This EUA will remain in effect (meaning this test can be used) for the duration of the COVID-19 declaration under Section 564(b)(1) of the Act, 21 U.S.C. section 360bbb-3(b)(1), unless the authorization is terminated or revoked sooner.  Performed at [facility], [place] [address], [place],  [address]       Radiology Studies: DG Chest 2 View  Result Date: 07/15/2019 CLINICAL DATA:  Sepsis. EXAM: CHEST - 2 VIEW COMPARISON:  Chest x-ray dated Jun 11, 2019. FINDINGS: The heart size and mediastinal contours are within normal limits.  Normal pulmonary vascularity. Chronic bibasilar scarring and bronchiectasis. No focal consolidation, pleural effusion, or pneumothorax. No acute osseous abnormality. IMPRESSION: No active cardiopulmonary disease. Electronically Signed  By: Titus Dubin M.D.   On: 07/15/2019 15:55     Scheduled Meds: . amLODipine  5 mg Oral Daily  . aspirin EC  81 mg Oral Daily  . atorvastatin  40 mg Oral q1800  . carvedilol  25 mg Oral BID WC  . chlorthalidone  25 mg Oral Daily  . enoxaparin (LOVENOX) injection  40 mg Subcutaneous Q24H  . guaiFENesin  600 mg Oral BID  . insulin aspart  0-5 Units Subcutaneous QHS  . insulin aspart  0-9 Units Subcutaneous TID WC  . latanoprost  1 drop Both Eyes QHS  . levothyroxine  88 mcg Oral Q0600  . metFORMIN  1,000 mg Oral BID WC  . PARoxetine  30 mg Oral Daily  . predniSONE  17.5 mg Oral Q breakfast   Continuous Infusions: . clindamycin (CLEOCIN) IV 600 mg (07/17/19 0533)  . magnesium sulfate bolus IVPB       LOS: 2 days    Time spent: 25 minutes spent in the coordination of care today.    Jonnie Finner, DO [facility]  If 7PM-7AM, please contact night-coverage [email or web address] 07/17/2019, 7:32 AM

## 2019-07-18 LAB — COMPREHENSIVE METABOLIC PANEL
ALT: 15 U/L (ref 0–44)
AST: 19 U/L (ref 15–41)
Albumin: 2.9 g/dL — ABNORMAL LOW (ref 3.5–5.0)
Alkaline Phosphatase: 76 U/L (ref 38–126)
Anion gap: 10 (ref 5–15)
BUN: 35 mg/dL — ABNORMAL HIGH (ref 8–23)
CO2: 20 mmol/L — ABNORMAL LOW (ref 22–32)
Calcium: 9.5 mg/dL (ref 8.9–10.3)
Chloride: 104 mmol/L (ref 98–111)
Creatinine, Ser: 1.43 mg/dL — ABNORMAL HIGH (ref 0.44–1.00)
GFR calc Af Amer: 39 mL/min — ABNORMAL LOW (ref >60)
GFR calc non Af Amer: 34 mL/min — ABNORMAL LOW (ref >60)
Glucose, Bld: 146 mg/dL — ABNORMAL HIGH (ref 70–99)
Potassium: 4.5 mmol/L (ref 3.5–5.1)
Sodium: 134 mmol/L — ABNORMAL LOW (ref 135–145)
Total Bilirubin: 1.2 mg/dL (ref 0.3–1.2)
Total Protein: 6.6 g/dL (ref 6.5–8.1)

## 2019-07-18 LAB — CBC WITH DIFFERENTIAL/PLATELET
Abs Immature Granulocytes: 0.27 10*3/uL — ABNORMAL HIGH (ref 0.00–0.07)
Basophils Absolute: 0 10*3/uL (ref 0.0–0.1)
Basophils Relative: 0 %
Eosinophils Absolute: 0.1 10*3/uL (ref 0.0–0.5)
Eosinophils Relative: 0 %
HCT: 29.8 % — ABNORMAL LOW (ref 36.0–46.0)
Hemoglobin: 10.1 g/dL — ABNORMAL LOW (ref 12.0–15.0)
Immature Granulocytes: 2 %
Lymphocytes Relative: 5 %
Lymphs Abs: 1 10*3/uL (ref 0.7–4.0)
MCH: 28.8 pg (ref 26.0–34.0)
MCHC: 33.9 g/dL (ref 30.0–36.0)
MCV: 84.9 fL (ref 80.0–100.0)
Monocytes Absolute: 2 10*3/uL — ABNORMAL HIGH (ref 0.1–1.0)
Monocytes Relative: 11 %
Neutro Abs: 14.9 10*3/uL — ABNORMAL HIGH (ref 1.7–7.7)
Neutrophils Relative %: 82 %
Platelets: 215 10*3/uL (ref 150–400)
RBC: 3.51 MIL/uL — ABNORMAL LOW (ref 3.87–5.11)
RDW: 15.6 % — ABNORMAL HIGH (ref 11.5–15.5)
WBC: 18.2 10*3/uL — ABNORMAL HIGH (ref 4.0–10.5)
nRBC: 0 % (ref 0.0–0.2)

## 2019-07-18 LAB — MAGNESIUM: Magnesium: 1.5 mg/dL — ABNORMAL LOW (ref 1.7–2.4)

## 2019-07-18 LAB — GLUCOSE, CAPILLARY
Glucose-Capillary: 155 mg/dL — ABNORMAL HIGH (ref 70–99)
Glucose-Capillary: 167 mg/dL — ABNORMAL HIGH (ref 70–99)
Glucose-Capillary: 216 mg/dL — ABNORMAL HIGH (ref 70–99)
Glucose-Capillary: 242 mg/dL — ABNORMAL HIGH (ref 70–99)
Glucose-Capillary: 152 mg/dL — ABNORMAL HIGH (ref 70–99)

## 2019-07-18 MED ORDER — SODIUM CHLORIDE 0.9 % IV SOLN
2.0000 g | INTRAVENOUS | Status: DC
  Administered 2019-07-18 – 2019-07-19 (×2): 2 g via INTRAVENOUS
  Filled 2019-07-18 (×2): qty 2

## 2019-07-18 NOTE — Care Management Important Message (Signed)
Important Message  Patient Details IM Letter given to Kathrin Greathouse SW Case Manager to present to the  Patient Name: Caitlyn Aguirre MRN: [id number] Date of Birth: 1935-11-27   Medicare Important Message Given:  Yes     Kerin Salen 07/18/2019, 10:39 AM

## 2019-07-18 NOTE — Progress Notes (Signed)
PROGRESS NOTE    Caitlyn Aguirre  ZSW:[id number] DOB: [birth year] DOA: 07/15/2019 PCP: Axel Filler, MD   Brief Narrative:   Caitlyn Burkley Jonesis a 84 y.o.femalewith medical history significant ofDM2, COPD, chronic respiratory failure on 2L [place] at home. Presents with 3 days of left butt pain. She noticed soreness on Thursday. She denies any falls or physical injury to the area. She denies any alleviating factors. Putting pressure on the area worsens it. The pain in constant and sharp when pressed, but otherwise throbbing. She was seen at urgent care today for this issue and they recommended that she come to the ED. Of note, she has also been dyspnea. She denies any fevers or sick contacts. She states she has been out of her home oxygen for a week d/t difficulty with getting HH to replace her tanks.  6/15: Still having some fevers. UCx says multiple species, recollect. WBC going up. Question if we are not covering a UTI as well as we should. Will change abx to rocephin. She states that the cellulitis area is feeling better. The area in general is looking better. No abscess seen on [place]. Continue IV abx for now.   Assessment & Plan:   Active Problems:   Hypothyroidism   Type 2 diabetes mellitus with other specified complication (HCC)   Essential hypertension   COPD ([place])   Polymyalgia rheumatica (HCC)   CKD (chronic kidney disease) stage 3, GFR 30-59 ml/min   Cellulitis of buttock, left   Acute on chronic respiratory failure with hypoxia (HCC)  Sepsis secondary to cellulitis  Non-purulent cellulitis of left buttock UTI? - fever, tachycardia, elevated WBC and source at admission - cellulitis order set: start clindamycin - Bld Cx pending - EDPA checked [place]; no abscess seen; rpt [place] on 6/14 was negative for abscess - pain control w/ APAP and oxy - 6/15: Her WBC are up and she is still having fevers. UCx with multiple species and suggesting recollect. Are we  covering possibility of UTI enough? Change abx to rocephin. Cellulitis is improving.   COPD Acute on chronic hypoxia respiratory failure - she has been out of her oxygen at home for about a week; normal usage is 2L - dtr reports difficulty with getting HH to fill tank; will ask TOC to assist - she is now on 2L [place] and comfortable - continue PRN albuterol - 6/13: states she is at her baseline respiratory status  DM2 - last A1c was 7.1. - she is on metformin at home; continue - CBG, SSI and carb-mod diet ordered - 6/13: continue as above.  CKD 3b - looks like she is at her baseline creatinine - watch nephrotoxins  HTN - continue chlorthalidone, coreg and norvasc  Hypothyroidism - continue synthroid  Hx of polymyalgia rheumatica - currently on a long steroid taper started by her PCP; per his note, she should be on 17.5 mg prednisone through 7/4. Resume prednisone.  Hypomagnesemia     - replace, follow.  DVT prophylaxis: lovenox Code Status: FULL Family Communication: Spoke with dtr by phone   Status is: Inpatient  Remains inpatient appropriate because:IV treatments appropriate due to intensity of illness or inability to take PO   Dispo: The patient is from: Home              Anticipated d/c is to: Home              Anticipated d/c date is: 2 days  Patient currently is not medically stable to d/c.  Consultants:   None  Procedures:   None  Antimicrobials:  . Rocephin   ROS:  Reports butt pain. Denies dysuria, CP, dyspnea . Remainder 10-pt ROS is negative for all not previously mentioned.  Subjective: "It feels ok. It's better than yesterday."  Objective: Vitals:   07/18/19 0640 07/18/19 1006 07/18/19 1007 07/18/19 1412  BP:  100/60 124/64 130/68  Pulse:   94 88  Resp:    20  Temp: 98.7 F (37.1 C)   98.4 F (36.9 C)  TempSrc: Oral   Oral  SpO2:    95%  Weight:      Height:         Intake/Output Summary (Last 24 hours) at 07/18/2019 1632 Last data filed at 07/18/2019 1412 Gross per 24 hour  Intake 650.19 ml  Output 1300 ml  Net -649.81 ml   Filed Weights   07/15/19 1626  Weight: 93.4 kg    Examination:  General: 84 y.o. female resting in bed in NAD Cardiovascular: RRR, +S1, S2, no m/g/r, equal pulses throughout Respiratory: CTABL, no w/r/r, normal WOB on 2L [place] GI: BS+, NDNT, no masses noted, no organomegaly noted MSK: No e/c/c, left gluteal cellulitis w/ induration improving Neuro: Alert to name, follows commands Psyc: Appropriate interaction and affect, calm/cooperative   Data Reviewed: I have personally reviewed following labs and imaging studies.  CBC: Recent Labs  Lab 07/15/19 1519 07/16/19 0437 07/17/19 0455 07/18/19 0624  WBC 18.9* 16.9* 13.8* 18.2*  NEUTROABS 14.6*  --  11.3* 14.9*  HGB 11.2* 10.3* 9.9* 10.1*  HCT 34.8* 31.1* 29.5* 29.8*  MCV 87.7 86.4 85.3 84.9  PLT 187 170 173 427   Basic Metabolic Panel: Recent Labs  Lab 07/15/19 1519 07/16/19 0437 07/17/19 0455 07/18/19 0624  NA 137 138 140 134*  K 4.9 4.6 4.7 4.5  CL 102 105 105 104  CO2 23 22 24  20*  GLUCOSE 187* 172* 105* 146*  BUN 26* 22 31* 35*  CREATININE 1.37* 1.14* 1.41* 1.43*  CALCIUM 9.9 9.5 9.8 9.5  MG  --   --  1.4* 1.5*   GFR: Estimated Creatinine Clearance: 36.2 mL/min (A) (by C-G formula based on SCr of 1.43 mg/dL (H)). Liver Function Tests: Recent Labs  Lab 07/15/19 1519 07/16/19 0437 07/17/19 0455 07/18/19 0624  AST 12* 12* 18 19  ALT 15 14 15 15   ALKPHOS 87 75 74 76  BILITOT 1.3* 1.1 1.1 1.2  PROT 7.2 6.4* 6.6 6.6  ALBUMIN 3.4* 3.0* 3.0* 2.9*   No results for input(s): LIPASE, AMYLASE in the last 168 hours. No results for input(s): AMMONIA in the last 168 hours. Coagulation Profile: Recent Labs  Lab 07/15/19 1519  INR 1.1   Cardiac Enzymes: No results for input(s): CKTOTAL, CKMB, CKMBINDEX, TROPONINI in the last 168 hours. BNP  (last 3 results) No results for input(s): PROBNP in the last 8760 hours. HbA1C: Recent Labs    07/16/19 0437  HGBA1C 9.2*   CBG: Recent Labs  Lab 07/17/19 1646 07/17/19 2116 07/18/19 0738 07/18/19 0959 07/18/19 1146  GLUCAP 232* 134* 155* 167* 216*   Lipid Profile: No results for input(s): CHOL, HDL, LDLCALC, TRIG, CHOLHDL, LDLDIRECT in the last 72 hours. Thyroid Function Tests: No results for input(s): TSH, T4TOTAL, FREET4, T3FREE, THYROIDAB in the last 72 hours. Anemia Panel: No results for input(s): VITAMINB12, FOLATE, FERRITIN, TIBC, IRON, RETICCTPCT in the last 72 hours. Sepsis Labs: Recent Labs  Lab 07/15/19  1519  LATICACIDVEN 1.8    Recent Results (from the past 240 hour(s))  Culture, blood (Routine x 2)     Status: None (Preliminary result)   Collection Time: 07/15/19  3:19 PM   Specimen: BLOOD LEFT FOREARM  Result Value Ref Range Status   Specimen Description   Final    BLOOD LEFT FOREARM Performed at [facility] [address], [place], [place] [address]    Special Requests   Final    BOTTLES DRAWN AEROBIC AND ANAEROBIC Blood Culture adequate volume Performed at [facility] [address] [place], [place] [address]    Culture   Final    NO GROWTH 3 DAYS Performed at [facility], [place] [address], [place], [place] [address]    Report Status PENDING  Incomplete  Culture, blood (Routine x 2)     Status: None (Preliminary result)   Collection Time: 07/15/19  3:24 PM   Specimen: BLOOD  Result Value Ref Range Status   Specimen Description   Final    BLOOD RIGHT ANTECUBITAL Performed at [facility], [place] [address] [place], [place] [address]    Special Requests   Final    BOTTLES DRAWN AEROBIC AND ANAEROBIC Blood Culture results may not be optimal due to an excessive volume of blood received in culture bottles Performed at [facility] [address], [place], [place] [address]     Culture   Final    NO GROWTH 3 DAYS Performed at [facility], [place] [address], [place], [place] [address]    Report Status PENDING  Incomplete  SARS Coronavirus 2 by RT PCR (hospital order, performed in [facility] hospital lab) Nasopharyngeal Nasopharyngeal Swab     Status: None   Collection Time: 07/15/19  4:52 PM   Specimen: Nasopharyngeal Swab  Result Value Ref Range Status   SARS Coronavirus 2 NEGATIVE NEGATIVE Final    Comment: (NOTE) SARS-CoV-2 target nucleic acids are NOT DETECTED.  The SARS-CoV-2 RNA is generally detectable in upper and lower respiratory specimens during the acute phase of infection. The lowest concentration of SARS-CoV-2 viral copies this assay can detect is 250 copies / mL. A negative result does not preclude SARS-CoV-2 infection and should not be used as the sole basis for treatment or other patient management decisions.  A negative result may occur with improper specimen collection / handling, submission of specimen other than nasopharyngeal swab, presence of viral mutation(s) within the areas targeted by this assay, and inadequate number of viral copies (<250 copies / mL). A negative result must be combined with clinical observations, patient history, and epidemiological information.  Fact Sheet for Patients:   StrictlyIdeas.no  Fact Sheet for Healthcare Providers: BankingDealers.co.za  This test is not yet approved or  cleared by the [place] FDA and has been authorized for detection and/or diagnosis of SARS-CoV-2 by FDA under an Emergency Use Authorization (EUA).  This EUA will remain in effect (meaning this test can be used) for the duration of the COVID-19 declaration under Section 564(b)(1) of the Act, 21 U.S.C. section 360bbb-3(b)(1), unless the authorization is terminated or revoked sooner.  Performed at [facility], [place] [address] [place],  [address]    Urine culture     Status: Abnormal   Collection Time: 07/15/19  6:00 PM   Specimen: In/Out Cath Urine  Result Value Ref Range Status   Specimen Description   Final  IN/OUT CATH URINE Performed at [facility], [place] [address] [place], [place] [address]    Special Requests   Final    NONE Performed at [facility], [place] [address], [place], [place] [address]    Culture MULTIPLE SPECIES PRESENT, SUGGEST RECOLLECTION (A)  Final   Report Status 07/17/2019 FINAL  Final      Radiology Studies: US PELVIS LIMITED (TRANSABDOMINAL ONLY)  Result Date: 07/17/2019 CLINICAL DATA:  Left medial buttock abscess. EXAM: LIMITED ULTRASOUND OF PELVIS TECHNIQUE: Limited transabdominal ultrasound examination of the pelvis was performed. COMPARISON:  None. FINDINGS: Targeted ultrasound of the medial left buttocks in the area of clinical concern demonstrates soft tissue edema without a fluid collection. IMPRESSION: No abscess identified. Electronically Signed   By: Logan Bores M.D.   On: 07/17/2019 13:50     Scheduled Meds: . aspirin EC  81 mg Oral Daily  . atorvastatin  40 mg Oral q1800  . carvedilol  25 mg Oral BID WC  . chlorthalidone  25 mg Oral Daily  . enoxaparin (LOVENOX) injection  40 mg Subcutaneous Q24H  . guaiFENesin  600 mg Oral BID  . insulin aspart  0-5 Units Subcutaneous QHS  . insulin aspart  0-9 Units Subcutaneous TID WC  . latanoprost  1 drop Both Eyes QHS  . levothyroxine  88 mcg Oral Q0600  . metFORMIN  1,000 mg Oral BID WC  . PARoxetine  30 mg Oral Daily  . predniSONE  17.5 mg Oral Q breakfast   Continuous Infusions: . cefTRIAXone (ROCEPHIN)  IV 2 g (07/18/19 1255)     LOS: 3 days    Time spent: 25 minutes spent in the coordination of care today.    Jonnie Finner, DO [facility]  If 7PM-7AM, please contact night-coverage [email or web address] 07/18/2019, 4:32 PM

## 2019-07-19 ENCOUNTER — Inpatient Hospital Stay (HOSPITAL_COMMUNITY): Payer: Medicare Other

## 2019-07-19 DIAGNOSIS — A419 Sepsis, unspecified organism: Principal | ICD-10-CM

## 2019-07-19 LAB — CBC
HCT: 28.8 % — ABNORMAL LOW (ref 36.0–46.0)
Hemoglobin: 9.7 g/dL — ABNORMAL LOW (ref 12.0–15.0)
MCH: 28.9 pg (ref 26.0–34.0)
MCHC: 33.7 g/dL (ref 30.0–36.0)
MCV: 85.7 fL (ref 80.0–100.0)
Platelets: 199 10*3/uL (ref 150–400)
RBC: 3.36 MIL/uL — ABNORMAL LOW (ref 3.87–5.11)
RDW: 15.8 % — ABNORMAL HIGH (ref 11.5–15.5)
WBC: 17.4 10*3/uL — ABNORMAL HIGH (ref 4.0–10.5)
nRBC: 0 % (ref 0.0–0.2)

## 2019-07-19 LAB — COMPREHENSIVE METABOLIC PANEL
ALT: 18 U/L (ref 0–44)
AST: 23 U/L (ref 15–41)
Albumin: 3 g/dL — ABNORMAL LOW (ref 3.5–5.0)
Alkaline Phosphatase: 80 U/L (ref 38–126)
Anion gap: 11 (ref 5–15)
BUN: 35 mg/dL — ABNORMAL HIGH (ref 8–23)
CO2: 21 mmol/L — ABNORMAL LOW (ref 22–32)
Calcium: 9.8 mg/dL (ref 8.9–10.3)
Chloride: 105 mmol/L (ref 98–111)
Creatinine, Ser: 1.26 mg/dL — ABNORMAL HIGH (ref 0.44–1.00)
GFR calc Af Amer: 46 mL/min — ABNORMAL LOW (ref >60)
GFR calc non Af Amer: 40 mL/min — ABNORMAL LOW (ref >60)
Glucose, Bld: 103 mg/dL — ABNORMAL HIGH (ref 70–99)
Potassium: 4.8 mmol/L (ref 3.5–5.1)
Sodium: 137 mmol/L (ref 135–145)
Total Bilirubin: 0.6 mg/dL (ref 0.3–1.2)
Total Protein: 6.6 g/dL (ref 6.5–8.1)

## 2019-07-19 LAB — GLUCOSE, CAPILLARY
Glucose-Capillary: 96 mg/dL (ref 70–99)
Glucose-Capillary: 179 mg/dL — ABNORMAL HIGH (ref 70–99)
Glucose-Capillary: 221 mg/dL — ABNORMAL HIGH (ref 70–99)
Glucose-Capillary: 147 mg/dL — ABNORMAL HIGH (ref 70–99)

## 2019-07-19 MED ORDER — IOHEXOL 9 MG/ML PO SOLN
500.0000 mL | ORAL | Status: AC
  Administered 2019-07-19 (×2): 500 mL via ORAL

## 2019-07-19 MED ORDER — IOHEXOL 300 MG/ML  SOLN
75.0000 mL | Freq: Once | INTRAMUSCULAR | Status: AC | PRN
  Administered 2019-07-19: 75 mL via INTRAVENOUS

## 2019-07-19 MED ORDER — IOHEXOL 9 MG/ML PO SOLN
ORAL | Status: AC
  Filled 2019-07-19: qty 1000

## 2019-07-19 MED ORDER — MAGNESIUM OXIDE 400 (241.3 MG) MG PO TABS
400.0000 mg | ORAL_TABLET | Freq: Two times a day (BID) | ORAL | Status: DC
  Administered 2019-07-19 – 2019-07-22 (×7): 400 mg via ORAL
  Filled 2019-07-19 (×7): qty 1

## 2019-07-19 MED ORDER — SODIUM CHLORIDE (PF) 0.9 % IJ SOLN
INTRAMUSCULAR | Status: AC
  Filled 2019-07-19: qty 50

## 2019-07-19 NOTE — Progress Notes (Addendum)
PROGRESS NOTE  Caitlyn Aguirre  HUT:[id number] DOB: February 22, 1935 DOA: 07/15/2019  PCP: Axel Filler, MD  TEACHING SERVICE PATIENT--- we will CC PCP for outpatient follow-up  Chief Complaint  Patient presents with  . Fever  . Shortness of Breath  . Abscess   Brief Narrative:  84 year old black female Prior tobacco quit in [place] stage II-III COPD on home oxygen 2 L (07/18/2011 FEV/FVC 78% FEV1 86%)  DM TY 2, Probable polymyalgia rheumatica HFpEF echocardiogram 07/11/2018 55-60% EF, PASP 38 mm  HTN, hypothyroidism CAD status post DES-low risk stress test 10/31/2014 TIA Status post lap chole 01/18/2016  Admit 07/15/2019 from urgent care with hypoxia-on exam found to have erythematous indurated left upper buttock gluteal cleft cellulitis Started on clindamycin and pain control Developed some low blood pressures    Assessment & Plan:   Active Problems:   Hypothyroidism   Type 2 diabetes mellitus with other specified complication (HCC)   Essential hypertension   COPD (HCC)   Polymyalgia rheumatica (HCC)   CKD (chronic kidney disease) stage 3, GFR 30-59 ml/min   Cellulitis of buttock, left   Acute on chronic respiratory failure with hypoxia ([place])   1. Natal cleft cellulitis a. Pain is severe-Despite neg [place] x2, given her swelling and erythema [I cannot even touch area close to natal cleft,] I will order CT pelvis stat to ensure we are not missing an abcess b. Would continue at this time ceftriaxone 2. COPD stage III-IV a. Outpatient PFts should be performed ideally within the next 6 months to denote progression b. Continue meds albuterol 2.5 Neb 3. AKI superimposed on admission on CKD 3B 4. Mild metabolic acidosis 5.  hypomagnesemia a. Avoiding lisinopril as below b. Continue other meds without change c. Consider as an outpatient transition to HCTZ versus Hygroton [more electrolyte abnormalities with the latter] 6. Start replacement with Mag-Ox  400 twice daily  HTN  7. Polymyalgia rheumatica a. Continuing prednisone 17.5 b. May need to adjust downwards Metformin in the outpatient setting as below 8. DM TY 2 a. Continue Metformin 1000 twice daily b. Currently on sliding scale coverage sugars are well controlled c. Goal A1c probably above 8.0 given advanced age etc.--this has to be balanced with the risk for infection/cellulitis will defer to PCP in the outpatient setting 9. HFpEF with low risk stress test 2016 a. Continue goal-directed therapy Coreg 25 twice daily Hygroton 25 daily amlodipine 5 daily-I have held lisinopril at this time which may be resumed on PCP follow-up 10. Prior TIA a. On aspirin 81 b. Will need lipid panel 3 to 6 months also continue Lipitor 40 11. Prior lap chole 12. Hypothyroid a. Continue levothyroxine 88 mcg b. Consider TSH in 3 to 4 weeks  Addend 6:33 PM CT equivocal for ill def abscess Consulted Dr. Leighton Ruff for bedsdie eval to ensure no surgical need  DVT prophylaxis: lovenox Code Status: Full Family Communication: none present--will update as more is known D/w daughter on phone Disposition:   Status is: Inpatient  Remains inpatient appropriate because:Ongoing diagnostic testing needed not appropriate for outpatient work up and IV treatments appropriate due to intensity of illness or inability to take PO   Dispo: The patient is from: Home              Anticipated d/c is to: SNF              Anticipated d/c date is: 3 days  Patient currently is not medically stable to d/c.   Consultants:   None yet  Procedures: [place] x 2  Antimicrobials: Ceftriaxone 2 g since 6/15   Subjective: Awake coherent but in severe pain and cannot really sit up to eat given the swelling in her bottom Note elevated white count despite change of antibiotics to ceftriaxone yesterday She does not even allow me to touch the area on her bottom because of rawness and discomfort  Objective: Vitals:   07/18/19  1007 07/18/19 1412 07/18/19 2120 07/19/19 0524  BP: 124/64 130/68 125/68 (!) 116/59  Pulse: 94 88 79 80  Resp:  20 16 16   Temp:  98.4 F (36.9 C) 97.6 F (36.4 C) 98.5 F (36.9 C)  TempSrc:  Oral Oral Oral  SpO2:  95% 94% 98%  Weight:      Height:        Intake/Output Summary (Last 24 hours) at 07/19/2019 1009 Last data filed at 07/19/2019 0931 Gross per 24 hour  Intake 582.94 ml  Output 1000 ml  Net -417.06 ml   Filed Weights   07/15/19 1626  Weight: 93.4 kg    Examination:  General exam: Looking about stated age using nasal oxygen Mallampati 3 Respiratory system: Clear no added sound no rales rhonchi Cardiovascular system: S1-S2 no murmur Gastrointestinal system: Soft nontender no rebound In her natal cleft she has significant erythema and induration of the left cheek-I am not able to appreciate any fluctuance however and this seems all like brawny edema. Central nervous system: Intact Extremities: No lower extremity edema Skin: As above Psychiatry: Euthymic and pleasant    Data Reviewed: I have personally reviewed following labs and imaging studies   Radiology Studies: US PELVIS LIMITED (TRANSABDOMINAL ONLY)  Result Date: 07/17/2019 CLINICAL DATA:  Left medial buttock abscess. EXAM: LIMITED ULTRASOUND OF PELVIS TECHNIQUE: Limited transabdominal ultrasound examination of the pelvis was performed. COMPARISON:  None. FINDINGS: Targeted ultrasound of the medial left buttocks in the area of clinical concern demonstrates soft tissue edema without a fluid collection. IMPRESSION: No abscess identified. Electronically Signed   By: Logan Bores M.D.   On: 07/17/2019 13:50      Scheduled Meds: . aspirin EC  81 mg Oral Daily  . atorvastatin  40 mg Oral q1800  . carvedilol  25 mg Oral BID WC  . chlorthalidone  25 mg Oral Daily  . enoxaparin (LOVENOX) injection  40 mg Subcutaneous Q24H  . guaiFENesin  600 mg Oral BID  . insulin aspart  0-5 Units Subcutaneous QHS  .  insulin aspart  0-9 Units Subcutaneous TID WC  . latanoprost  1 drop Both Eyes QHS  . levothyroxine  88 mcg Oral Q0600  . metFORMIN  1,000 mg Oral BID WC  . PARoxetine  30 mg Oral Daily  . predniSONE  17.5 mg Oral Q breakfast   Continuous Infusions: . cefTRIAXone (ROCEPHIN)  IV 2 g (07/18/19 1255)     LOS: 4 days    Time spent: [place], MD [facility]   To contact the attending provider between 7A-7P or the covering provider during after hours 7P-7A, please log into the web site [email or web address] and access using universal [place] password for that web site. If you do not have the password, please call the hospital operator.  07/19/2019, 10:09 AM
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
## 2019-07-19 NOTE — Progress Notes (Signed)
Spoke with Pt's daughter and updated.

## 2019-07-20 ENCOUNTER — Inpatient Hospital Stay (HOSPITAL_COMMUNITY): Payer: Medicare Other | Admitting: Anesthesiology

## 2019-07-20 ENCOUNTER — Encounter (HOSPITAL_COMMUNITY): Payer: Self-pay | Admitting: Internal Medicine

## 2019-07-20 ENCOUNTER — Encounter (HOSPITAL_COMMUNITY)
Admission: AD | Disposition: A | Discharge status: Home / Self Care | Payer: Self-pay | Source: Home / Self Care | Attending: Family Medicine

## 2019-07-20 LAB — CULTURE, BLOOD (ROUTINE X 2)
Culture: NO GROWTH
Culture: NO GROWTH
Special Requests: ADEQUATE

## 2019-07-20 LAB — COMPREHENSIVE METABOLIC PANEL
ALT: 19 U/L (ref 0–44)
ALT: 18 U/L (ref 0–44)
AST: 23 U/L (ref 15–41)
AST: 22 U/L (ref 15–41)
Albumin: 2.6 g/dL — ABNORMAL LOW (ref 3.5–5.0)
Albumin: 2.7 g/dL — ABNORMAL LOW (ref 3.5–5.0)
Alkaline Phosphatase: 84 U/L (ref 38–126)
Alkaline Phosphatase: 78 U/L (ref 38–126)
Anion gap: 11 (ref 5–15)
Anion gap: 10 (ref 5–15)
BUN: 37 mg/dL — ABNORMAL HIGH (ref 8–23)
BUN: 37 mg/dL — ABNORMAL HIGH (ref 8–23)
CO2: 18 mmol/L — ABNORMAL LOW (ref 22–32)
CO2: 21 mmol/L — ABNORMAL LOW (ref 22–32)
Calcium: 9.6 mg/dL (ref 8.9–10.3)
Calcium: 9.1 mg/dL (ref 8.9–10.3)
Chloride: 102 mmol/L (ref 98–111)
Chloride: 99 mmol/L (ref 98–111)
Creatinine, Ser: 1.43 mg/dL — ABNORMAL HIGH (ref 0.44–1.00)
Creatinine, Ser: 1.47 mg/dL — ABNORMAL HIGH (ref 0.44–1.00)
GFR calc Af Amer: 39 mL/min — ABNORMAL LOW (ref >60)
GFR calc Af Amer: 38 mL/min — ABNORMAL LOW (ref >60)
GFR calc non Af Amer: 34 mL/min — ABNORMAL LOW (ref >60)
GFR calc non Af Amer: 33 mL/min — ABNORMAL LOW (ref >60)
Glucose, Bld: 88 mg/dL (ref 70–99)
Glucose, Bld: 202 mg/dL — ABNORMAL HIGH (ref 70–99)
Potassium: 5.8 mmol/L — ABNORMAL HIGH (ref 3.5–5.1)
Potassium: 4.8 mmol/L (ref 3.5–5.1)
Sodium: 131 mmol/L — ABNORMAL LOW (ref 135–145)
Sodium: 130 mmol/L — ABNORMAL LOW (ref 135–145)
Total Bilirubin: 1 mg/dL (ref 0.3–1.2)
Total Bilirubin: 0.8 mg/dL (ref 0.3–1.2)
Total Protein: 6.4 g/dL — ABNORMAL LOW (ref 6.5–8.1)
Total Protein: 6.2 g/dL — ABNORMAL LOW (ref 6.5–8.1)

## 2019-07-20 LAB — CBC WITH DIFFERENTIAL/PLATELET
Abs Immature Granulocytes: 0.77 10*3/uL — ABNORMAL HIGH (ref 0.00–0.07)
Basophils Absolute: 0.1 10*3/uL (ref 0.0–0.1)
Basophils Relative: 0 %
Eosinophils Absolute: 0.1 10*3/uL (ref 0.0–0.5)
Eosinophils Relative: 1 %
HCT: 33.3 % — ABNORMAL LOW (ref 36.0–46.0)
Hemoglobin: 11.2 g/dL — ABNORMAL LOW (ref 12.0–15.0)
Immature Granulocytes: 3 %
Lymphocytes Relative: 8 %
Lymphs Abs: 1.7 10*3/uL (ref 0.7–4.0)
MCH: 28.1 pg (ref 26.0–34.0)
MCHC: 33.6 g/dL (ref 30.0–36.0)
MCV: 83.5 fL (ref 80.0–100.0)
Monocytes Absolute: 2 10*3/uL — ABNORMAL HIGH (ref 0.1–1.0)
Monocytes Relative: 9 %
Neutro Abs: 17.8 10*3/uL — ABNORMAL HIGH (ref 1.7–7.7)
Neutrophils Relative %: 79 %
Platelets: 225 10*3/uL (ref 150–400)
RBC: 3.99 MIL/uL (ref 3.87–5.11)
RDW: 15.5 % (ref 11.5–15.5)
WBC: 22.4 10*3/uL — ABNORMAL HIGH (ref 4.0–10.5)
nRBC: 0.1 % (ref 0.0–0.2)

## 2019-07-20 LAB — GLUCOSE, CAPILLARY
Glucose-Capillary: 97 mg/dL (ref 70–99)
Glucose-Capillary: 116 mg/dL — ABNORMAL HIGH (ref 70–99)
Glucose-Capillary: 187 mg/dL — ABNORMAL HIGH (ref 70–99)
Glucose-Capillary: 156 mg/dL — ABNORMAL HIGH (ref 70–99)

## 2019-07-20 LAB — SURGICAL PCR SCREEN
MRSA, PCR: NEGATIVE
Staphylococcus aureus: NEGATIVE

## 2019-07-20 SURGERY — INCISION AND DRAINAGE, ABSCESS, PERIRECTAL
Anesthesia: General

## 2019-07-20 MED ORDER — BUPIVACAINE HCL 0.25 % IJ SOLN
INTRAMUSCULAR | Status: AC
  Filled 2019-07-20: qty 1

## 2019-07-20 MED ORDER — ACETAMINOPHEN 10 MG/ML IV SOLN
1000.0000 mg | Freq: Once | INTRAVENOUS | Status: AC
  Administered 2019-07-20: 1000 mg via INTRAVENOUS

## 2019-07-20 MED ORDER — PHENYLEPHRINE 40 MCG/ML (10ML) SYRINGE FOR IV PUSH (FOR BLOOD PRESSURE SUPPORT)
PREFILLED_SYRINGE | INTRAVENOUS | Status: DC | PRN
  Administered 2019-07-20: 80 ug via INTRAVENOUS
  Administered 2019-07-20: 120 ug via INTRAVENOUS
  Administered 2019-07-20: 80 ug via INTRAVENOUS

## 2019-07-20 MED ORDER — PIPERACILLIN-TAZOBACTAM 3.375 G IVPB
3.3750 g | Freq: Three times a day (TID) | INTRAVENOUS | Status: DC
  Administered 2019-07-20 – 2019-07-24 (×13): 3.375 g via INTRAVENOUS
  Filled 2019-07-20 (×13): qty 50

## 2019-07-20 MED ORDER — MORPHINE SULFATE (PF) 2 MG/ML IV SOLN
1.0000 mg | INTRAVENOUS | Status: DC | PRN
  Administered 2019-07-20: 1 mg via INTRAVENOUS
  Administered 2019-07-21: 2 mg via INTRAVENOUS
  Filled 2019-07-20 (×2): qty 1

## 2019-07-20 MED ORDER — BUPIVACAINE LIPOSOME 1.3 % IJ SUSP
20.0000 mL | Freq: Once | INTRAMUSCULAR | Status: AC
  Administered 2019-07-20: 20 mL
  Filled 2019-07-20: qty 20

## 2019-07-20 MED ORDER — PIPERACILLIN-TAZOBACTAM 3.375 G IVPB 30 MIN: 3.3750 g | Freq: Three times a day (TID) | INTRAVENOUS | Status: DC

## 2019-07-20 MED ORDER — BUPIVACAINE HCL (PF) 0.25 % IJ SOLN
INTRAMUSCULAR | Status: DC | PRN
  Administered 2019-07-20: 30 mL

## 2019-07-20 MED ORDER — ROCURONIUM BROMIDE 10 MG/ML (PF) SYRINGE
PREFILLED_SYRINGE | INTRAVENOUS | Status: DC | PRN
  Administered 2019-07-20: 100 mg via INTRAVENOUS

## 2019-07-20 MED ORDER — DEXAMETHASONE SODIUM PHOSPHATE 10 MG/ML IJ SOLN
INTRAMUSCULAR | Status: AC
  Filled 2019-07-20: qty 1

## 2019-07-20 MED ORDER — PROPOFOL 10 MG/ML IV BOLUS
INTRAVENOUS | Status: AC
  Filled 2019-07-20: qty 20

## 2019-07-20 MED ORDER — ONDANSETRON HCL 4 MG/2ML IJ SOLN
INTRAMUSCULAR | Status: DC | PRN
  Administered 2019-07-20: 4 mg via INTRAVENOUS

## 2019-07-20 MED ORDER — SUGAMMADEX SODIUM 200 MG/2ML IV SOLN
INTRAVENOUS | Status: DC | PRN
  Administered 2019-07-20: 200 mg via INTRAVENOUS

## 2019-07-20 MED ORDER — ACETAMINOPHEN 10 MG/ML IV SOLN
INTRAVENOUS | Status: AC
  Filled 2019-07-20: qty 100

## 2019-07-20 MED ORDER — LIDOCAINE 2% (20 MG/ML) 5 ML SYRINGE
INTRAMUSCULAR | Status: DC | PRN
  Administered 2019-07-20: 40 mg via INTRAVENOUS

## 2019-07-20 MED ORDER — LACTATED RINGERS IV SOLN: INTRAVENOUS | Status: DC

## 2019-07-20 MED ORDER — FENTANYL CITRATE (PF) 100 MCG/2ML IJ SOLN
INTRAMUSCULAR | Status: AC
  Filled 2019-07-20: qty 2

## 2019-07-20 MED ORDER — CHLORHEXIDINE GLUCONATE 0.12 % MT SOLN
15.0000 mL | Freq: Once | OROMUCOSAL | Status: DC
  Filled 2019-07-20: qty 15

## 2019-07-20 MED ORDER — LIDOCAINE 2% (20 MG/ML) 5 ML SYRINGE
INTRAMUSCULAR | Status: AC
  Filled 2019-07-20: qty 5

## 2019-07-20 MED ORDER — ONDANSETRON HCL 4 MG/2ML IJ SOLN
INTRAMUSCULAR | Status: AC
  Filled 2019-07-20: qty 2

## 2019-07-20 MED ORDER — FENTANYL CITRATE (PF) 100 MCG/2ML IJ SOLN
INTRAMUSCULAR | Status: DC | PRN
  Administered 2019-07-20: 25 ug via INTRAVENOUS

## 2019-07-20 MED ORDER — PROPOFOL 10 MG/ML IV BOLUS
INTRAVENOUS | Status: DC | PRN
  Administered 2019-07-20: 100 mg via INTRAVENOUS

## 2019-07-20 SURGICAL SUPPLY — 29 items
BLADE HEX COATED 2.75 (ELECTRODE) IMPLANT
BLADE SURG 15 STRL LF DISP TIS (BLADE) IMPLANT
BLADE SURG 15 STRL SS (BLADE)
COVER WAND RF STERILE (DRAPES) IMPLANT
DRSG PAD ABDOMINAL 8X10 ST (GAUZE/BANDAGES/DRESSINGS) ×2 IMPLANT
ELECT REM PT RETURN 15FT ADLT (MISCELLANEOUS) ×2 IMPLANT
GAUZE 4X4 16PLY RFD (DISPOSABLE) ×2 IMPLANT
GAUZE PACKING IODOFORM 1/4X15 (PACKING) IMPLANT
GAUZE SPONGE 4X4 12PLY STRL (GAUZE/BANDAGES/DRESSINGS) ×2 IMPLANT
GLOVE BIO SURGEON STRL SZ7 (GLOVE) ×16 IMPLANT
GLOVE BIOGEL PI IND STRL 7.0 (GLOVE) ×1 IMPLANT
GLOVE BIOGEL PI INDICATOR 7.0 (GLOVE) ×1
GOWN STRL REUS W/TWL LRG LVL3 (GOWN DISPOSABLE) ×2 IMPLANT
GOWN STRL REUS W/TWL XL LVL3 (GOWN DISPOSABLE) ×4 IMPLANT
KIT BASIN (CUSTOM PROCEDURE TRAY) ×2 IMPLANT
KIT TURNOVER KIT A (KITS) ×2 IMPLANT
NEEDLE HYPO 25X1 1.5 SAFETY (NEEDLE) IMPLANT
PACK GENERAL/GYN (CUSTOM PROCEDURE TRAY) ×2 IMPLANT
PACK LITHOTOMY IV (CUSTOM PROCEDURE TRAY) IMPLANT
PENCIL SMOKE EVACUATOR (MISCELLANEOUS) IMPLANT
SOL PREP PROV IODINE SCRUB 4OZ (MISCELLANEOUS) ×2 IMPLANT
SURGILUBE 2OZ TUBE FLIPTOP (MISCELLANEOUS) ×2 IMPLANT
SUT CHROMIC 2 0 SH (SUTURE) IMPLANT
SUT CHROMIC 3 0 SH 27 (SUTURE) IMPLANT
SWAB COLLECTION DEVICE MRSA (MISCELLANEOUS) IMPLANT
SYR CONTROL 10ML LL (SYRINGE) IMPLANT
TOWEL OR 17X26 10 PK STRL BLUE (TOWEL DISPOSABLE) ×2 IMPLANT
UNDERPAD 30X36 HEAVY ABSORB (UNDERPADS AND DIAPERS) IMPLANT
YANKAUER SUCT BULB TIP 10FT TU (MISCELLANEOUS) IMPLANT

## 2019-07-20 NOTE — Anesthesia Procedure Notes (Signed)
Procedure Name: Intubation Date/Time: 07/20/2019 11:33 AM Performed by: Sharlette Dense, CRNA Patient Re-evaluated:Patient Re-evaluated prior to induction Oxygen Delivery Method: Circle system utilized Preoxygenation: Pre-oxygenation with 100% oxygen Induction Type: IV induction Ventilation: Mask ventilation without difficulty and Oral airway inserted - appropriate to patient size Laryngoscope Size: Sabra Heck and 2 Grade View: Grade I Tube type: Oral Tube size: 7.0 mm Number of attempts: 1 Airway Equipment and Method: Stylet Placement Confirmation: ETT inserted through vocal cords under direct vision,  positive ETCO2 and breath sounds checked- equal and bilateral Secured at: 20 cm Tube secured with: Tape Dental Injury: Teeth and Oropharynx as per pre-operative assessment

## 2019-07-20 NOTE — Anesthesia Postprocedure Evaluation (Signed)
Anesthesia Post Note  Patient: Caitlyn Aguirre  Procedure(s) Performed: IRRIGATION AND DEBRIDEMENT PERIANAL ABSCESS (N/A )     Patient location during evaluation: PACU Anesthesia Type: General Level of consciousness: awake and alert Pain management: pain level controlled Vital Signs Assessment: post-procedure vital signs reviewed and stable Respiratory status: spontaneous breathing, nonlabored ventilation and respiratory function stable Cardiovascular status: blood pressure returned to baseline and stable Postop Assessment: no apparent nausea or vomiting Anesthetic complications: no   No complications documented.  Last Vitals:  Vitals:   07/20/19 1315 07/20/19 1330  BP: (!) 111/52 (!) 103/53  Pulse: 80 79  Resp: 18 18  Temp:  37.7 C  SpO2: 95% 95%    Last Pain:  Vitals:   07/20/19 1330  TempSrc: Oral  PainSc: 0-No pain                 Lidia Collum

## 2019-07-20 NOTE — Anesthesia Preprocedure Evaluation (Signed)
Anesthesia Evaluation  Patient identified by MRN, date of birth, ID band Patient awake    Reviewed: Allergy & Precautions, NPO status , Patient's Chart, lab work & pertinent test results, reviewed documented beta blocker date and time   History of Anesthesia Complications Negative for: history of anesthetic complications  Airway Mallampati: II  TM Distance: >3 FB Neck ROM: Full    Dental  (+) Edentulous Upper, Edentulous Lower   Pulmonary sleep apnea , COPD,  oxygen dependent, former smoker,    Pulmonary exam normal        Cardiovascular hypertension, Pt. on home beta blockers and Pt. on medications + Past MI and +CHF  Normal cardiovascular exam     Neuro/Psych Depression CVA (2009)    GI/Hepatic Neg liver ROS, GERD  ,  Endo/Other  diabetes, Type 2, Oral Hypoglycemic AgentsHypothyroidism   Renal/GU Renal InsufficiencyRenal disease  negative genitourinary   Musculoskeletal negative musculoskeletal ROS (+)   Abdominal   Peds  Hematology  (+) Blood dyscrasia, anemia ,   Anesthesia Other Findings  COPD on home O2, OSA, DM2, CKD3, HTN, hypothyroid, h/o CHF w/ recovered EF  Echo 07/11/18: EF 55-60%, unremarkable valves Stress test 2016: Low risk stress nuclear study with a medium sized area of mild anterolateral scar, no reversible ischemia and normal left ventricular regional and global systolic function.  Reproductive/Obstetrics                             Anesthesia Physical Anesthesia Plan  ASA: IV  Anesthesia Plan: General   Post-op Pain Management:    Induction: Intravenous  PONV Risk Score and Plan: 3 and Ondansetron, Dexamethasone, Treatment may vary due to age or medical condition and Midazolam  Airway Management Planned: Oral ETT  Additional Equipment: None  Intra-op Plan:   Post-operative Plan: Extubation in OR  Informed Consent: I have reviewed the patients History and  Physical, chart, labs and discussed the procedure including the risks, benefits and alternatives for the proposed anesthesia with the patient or authorized representative who has indicated his/her understanding and acceptance.     Dental advisory given  Plan Discussed with:   Anesthesia Plan Comments:         Anesthesia Quick Evaluation

## 2019-07-20 NOTE — Progress Notes (Signed)
PROGRESS NOTE  Caitlyn Aguirre  BSW:[id number] DOB: September 30, 1935 DOA: 07/15/2019  PCP: Axel Filler, MD  TEACHING SERVICE PATIENT--- we will CC PCP for outpatient follow-up  Chief Complaint  Patient presents with  . Fever  . Shortness of Breath  . Abscess   Brief Narrative:  84 year old black female Prior tobacco quit in [place] stage II-III COPD on home oxygen 2 L (07/18/2011 FEV/FVC 78% FEV1 86%)  DM TY 2, Probable polymyalgia rheumatica HFpEF echocardiogram 07/11/2018 55-60% EF, PASP 38 mm  HTN, hypothyroidism CAD status post DES-low risk stress test 10/31/2014 TIA Status post lap chole 01/18/2016  Admit 07/15/2019 from urgent care with hypoxia-on exam found to have erythematous indurated left upper buttock gluteal cleft cellulitis Started on clindamycin and pain control Developed some low blood pressures    Assessment & Plan:   Active Problems:   Hypothyroidism   Type 2 diabetes mellitus with other specified complication (HCC)   Essential hypertension   COPD (HCC)   Polymyalgia rheumatica (HCC)   CKD (chronic kidney disease) stage 3, GFR 30-59 ml/min   Cellulitis of buttock, left   Acute on chronic respiratory failure with hypoxia ([place])   1. Natal cleft cellulitis + Asbcess status post incision and drainage Dr. Mercy Riding 07/20/2019 a. CT scan 6/16 = abscess b. Appreciate Dr. Ninfa Linden input and surgery 6/17 c. Follow wound/deep cultures and narrow appropriately d. With Penrose drain debility and advanced age suspect she will need skilled care and will ask therapy to evaluate her tomorrow e. Ceftriaxone transitioned to Zosyn secondary to perianal abscess-de-escalate as appropriate and cultures permit f. Pain control as per surgeons-careful with sedation 2. COPD stage III-IV-- oxygen 2 L and will need to be on this on discharge a. Outpatient PFts per PCP b. Continue meds albuterol 2.5 Neb-add 3. AKI superimposed on admission on CKD 3B 4. Mild  metabolic acidosis 5.  hypomagnesemia, and hyperkalemia a. Holding diuretic antihypertensives at this time b. Continue Coreg 25 twice daily (unusually high dose) 6. Start replacement with Mag-Ox  400 twice daily HTN  7. Polymyalgia rheumatica a. Continuing prednisone 17.5 b. May need to adjust downwards Metformin in the outpatient setting as below 8. DM TY 2 a. Continue Metformin 1000 twice daily  b. Currently on sliding scale coverage sugars are well controlled c. Goal A1c probably above 8.0 given advanced age etc.--this has to be balanced with the risk for infection/cellulitis holding 9. HFpEF with low risk stress test 2016 a. Continue goal-directed therapy Coreg 25 twice daily continue amlodipine 5-Hycodan and lisinopril have been held 10. Prior TIA a. On aspirin 81 b. Will need lipid panel 3 to 6 months also continue Lipitor 40 11. Prior lap chole 12. Hypothyroid a. Continue levothyroxine 88 mcg b. Consider TSH in 3 to 4 weeks   DVT prophylaxis: lovenox Code Status: Full D/w daughter on phone 6/16 will update in am Disposition:   Status is: Inpatient  Remains inpatient appropriate because:Ongoing diagnostic testing needed not appropriate for outpatient work up and IV treatments appropriate due to intensity of illness or inability to take PO   Dispo: The patient is from: Home              Anticipated d/c is to: SNF              Anticipated d/c date is: 3 days              Patient currently is not medically stable to d/c.   Consultants:  None yet  Procedures: [place] x 2  Antimicrobials: Ceftriaxone 2 g since 6/15 Zosyn started on 6/17.   Subjective:  Well  pain is controlled and much better Just had   Objective: Vitals:   07/20/19 1245 07/20/19 1300 07/20/19 1315 07/20/19 1330  BP: (!) 109/98 102/90 (!) 111/52 (!) 103/53  Pulse: 85 80 80 79  Resp: (!) 23 16 18 18   Temp:  100 F (37.8 C)  99.9 F (37.7 C)  TempSrc:    Oral  SpO2: 93% 93% 95% 95%  Weight:       Height:        Intake/Output Summary (Last 24 hours) at 07/20/2019 1433 Last data filed at 07/20/2019 1227 Gross per 24 hour  Intake 1810 ml  Output 850 ml  Net 960 ml   Filed Weights   07/15/19 1626  Weight: 93.4 kg    Examination: Awake alert in nad, on oxygen Ct ab no added sound abd soft-Dressing on buttocks Neuro intact     Data Reviewed: I have personally reviewed following labs and imaging studies Potassium up from 4.8-5.8-->4.8 CO2 down from 21-18--->21 BUN/creatinine 37/1.4 eGFR = 39 White count up from 17-22 Hemoglobin 11.2 platelets 225  Radiology Studies: CT PELVIS W CONTRAST  Result Date: 07/19/2019 CLINICAL DATA:  Left upper buttock gluteal cleft cellulitis with induration and erythema. EXAM: CT PELVIS WITH CONTRAST TECHNIQUE: Multidetector CT imaging of the pelvis was performed using the standard protocol following the bolus administration of intravenous contrast. CONTRAST:  15m OMNIPAQUE IOHEXOL 300 MG/ML  SOLN COMPARISON:  Superficial pelvic ultrasound 07/17/2019. Pelvic CT 02/02/2018. FINDINGS: Urinary Tract: The visualized distal ureters and bladder appear unremarkable. Bowel: No bowel wall thickening, distention or surrounding inflammation identified within the pelvis. Sigmoid colon diverticulosis. Vascular/Lymphatic: No enlarged pelvic lymph nodes identified. Aortoiliac atherosclerosis without aneurysm or large vessel occlusion. Reproductive: Stable low-density left adnexal lesion measuring 3.0 x 2.1 cm on image 8/2, consistent with a benign cyst. There are stable adnexal calcifications bilaterally. Probable 17 mm uterine fibroid on image 22/2. Other: Postsurgical changes in the anterior abdominal wall. No ascites. There is subcutaneous edema around the intergluteal fold with asymmetric inflammation and probable ill-defined fluid on the left, measuring up to 5.2 x 3.0 cm on image 45/2. This is likely contiguous with fluid extending along the posterior right  aspect of the anus and measuring up to 4.9 x 1.6 cm on image 49/2. No air or foreign bodies within these collections. Enteric contrast was not administered. No obvious overlying skin ulceration. Musculoskeletal: No acute or worrisome osseous findings. There are degenerative changes of both hips, sacroiliac joints in the lower lumbar spine. No evidence of osteomyelitis. IMPRESSION: 1. Subcutaneous edema around the intergluteal fold with asymmetric inflammatory and probable ill-defined fluid on the left, likely contiguous with fluid extending along the posterior right aspect of the anus. These findings are suspicious for cellulitis and probable early abscess formation. Etiology is not clearly demonstrated; underlying rectal fistula should be considered. 2. No evidence of osteomyelitis or intrapelvic inflammation. 3. Aortic Atherosclerosis (ICD10-I70.0). Electronically Signed   By: WRichardean SaleM.D.   On: 07/19/2019 16:52      Scheduled Meds: . aspirin EC  81 mg Oral Daily  . atorvastatin  40 mg Oral q1800  . carvedilol  25 mg Oral BID WC  . chlorhexidine  15 mL Mouth/Throat Once  . chlorthalidone  25 mg Oral Daily  . enoxaparin (LOVENOX) injection  40 mg Subcutaneous Q24H  . guaiFENesin  600  mg Oral BID  . insulin aspart  0-5 Units Subcutaneous QHS  . insulin aspart  0-9 Units Subcutaneous TID WC  . latanoprost  1 drop Both Eyes QHS  . levothyroxine  88 mcg Oral Q0600  . magnesium oxide  400 mg Oral BID  . metFORMIN  1,000 mg Oral BID WC  . PARoxetine  30 mg Oral Daily  . predniSONE  17.5 mg Oral Q breakfast   Continuous Infusions: . acetaminophen    . lactated ringers 50 mL/hr (07/20/19 1355)  . piperacillin-tazobactam (ZOSYN)  IV 3.375 g (07/20/19 1358)    LOS: 5 days   Time spent: [place], MD [facility]   To contact the attending provider between 7A-7P or the covering provider during after hours 7P-7A, please log into the web site [email or web address] and  access using universal [place] password for that web site. If you do not have the password, please call the hospital operator.  07/20/2019, 2:33 PM

## 2019-07-20 NOTE — Transfer of Care (Signed)
Immediate Anesthesia Transfer of Care Note  Patient: KARIZMA CHEEK  Procedure(s) Performed: IRRIGATION AND DEBRIDEMENT PERIANAL ABSCESS (N/A )  Patient Location: PACU  Anesthesia Type:General  Level of Consciousness: drowsy  Airway & Oxygen Therapy: Patient Spontanous Breathing and Patient connected to face mask oxygen  Post-op Assessment: Report given to RN and Post -op Vital signs reviewed and stable  Post vital signs: Reviewed and stable  Last Vitals:  Vitals Value Taken Time  BP 128/62 07/20/19 1226  Temp 38.4 C 07/20/19 1226  Pulse 85 07/20/19 1226  Resp 16 07/20/19 1226  SpO2 97 % 07/20/19 1226  Vitals shown include unvalidated device data.  Last Pain:  Vitals:   07/20/19 0740  TempSrc:   PainSc: 8       Patients Stated Pain Goal: 3 (33/54/56 2563)  Complications: No complications documented.

## 2019-07-20 NOTE — Op Note (Signed)
   Caitlyn Aguirre 07/20/2019   Pre-op Diagnosis: perianal/left buttock abscess     Post-op Diagnosis: same  Procedure(s): INCISION AND DRAINAGE PERIANAL,LEFT BUTTOCK ABSCESS  Surgeon(s): Coralie Keens, MD  Anesthesia: General  Staff:  Circulator: Lurena Joiner, RN Relief Circulator: Noralyn Pick, RN Scrub Person: Ophelia Charter, CST  Estimated Blood Loss: Minimal               Specimens: CULTURES SENT  Findings: The patient was found to have a large left buttock abscess tracking toward the posterior midline anus and slightly over to the right.  Cultures were obtained  Procedure: Patient was brought to operating room device correct patient.  She is placed on the operating table general anesthesia was induced.  She was then turned to the prone position.  Her perianal area and buttocks were then prepped and draped in usual sterile fashion.  I made an incision on the left buttock near the gluteal cleft just off the midline and entered a very large abscess cavity.  A large amount of purulence was evacuated.  Cultures were obtained.  This did track posterior to the anus.  There is no communication with the anus however.  It also tracked slightly toward the right buttocks.  I made a counterincision on the right buttocks in the past the Penrose drain between the 2 wounds.  I secured this in place with a nylon suture.  I then injected the area circumferentially with Exparel mixed with Marcaine.  Hemostasis was achieved with cautery.  I irrigated the wound extensively with saline.  I then packed it with a wet-to-dry saline soaked Kerlix gauze.  Dry gauze was placed over this.  The patient tolerated the procedure well.  All the counts were correct at the end of the procedure.  The patient was then placed back into a supine position and then extubated in the operating room and taken in a stable condition to the recovery room.          Coralie Keens   Date: 07/20/2019  Time:  12:01 PM

## 2019-07-20 NOTE — Evaluation (Signed)
Physical Therapy Evaluation Patient Details Name: Caitlyn Aguirre MRN: [id number] DOB: Jun 03, 1935 Today's Date: 07/20/2019   History of Present Illness  Patient Admit 07/15/2019 from urgent care with hypoxia-on exam found to have erythematous indurated left upper buttock gluteal cleft cellulitis. Patient started on clindamycin and pain control. She is now s/p I&D of preianal Lt buttock abscess on 07/30/19. PMH significant for DM, OA, MI, HTN, GERD, COPD, CHF.    Clinical Impression  Caitlyn Aguirre is 84 y.o. female admitted with above HPI and diagnosis. Patient is currently limited by functional impairments below (see PT problem list). Patient reports she lives with her daughter and is independent at baseline. Patient will benefit from continued skilled PT interventions to address impairments and progress independence with mobility, recommending SNF vs. HHPT pending progress and assistance available. Acute PT will follow and progress as able.     Follow Up Recommendations SNF;Home health PT (SNF vs HHPT depending on pt progress and family support)    Equipment Recommendations  Rolling walker with 5" wheels    Recommendations for Other Services OT consult     Precautions / Restrictions Precautions Precautions: Fall Restrictions Weight Bearing Restrictions: No      Mobility  Bed Mobility Overal bed mobility: Needs Assistance Bed Mobility: Supine to Sit;Sit to Supine     Supine to sit: Mod assist;HOB elevated Sit to supine: Min assist   General bed mobility comments: cues to use of bed rail and mod assist to raise trunk upright and reach for foot board with Rt UE. Pt appears to have slightly weaker Lt UE. With assistance to reposition feet and bring Rt UE to bed rail pt able to obtain midline posture. pt able to scoot laterally at EOB to move to [facility] prior to returning to supine. Pt declined further mobility and min assist required for LE's to return to supine. Pt left in slight chair  position in bed to eat dinner.   Transfers         Ambulation/Gait       Stairs     Wheelchair Mobility    Modified Rankin (Stroke Patients Only)       Balance Overall balance assessment: Needs assistance Sitting-balance support: Feet supported;Bilateral upper extremity supported;Single extremity supported Sitting balance-Leahy Scale: Fair   Postural control: Left lateral lean (suspect due to buttock pain)          Pertinent Vitals/Pain Pain Assessment: Faces Faces Pain Scale: Hurts little more Pain Location: buttock Pain Descriptors / Indicators: Discomfort;Grimacing;Guarding Pain Intervention(s): Limited activity within patient's tolerance;Monitored during session;Repositioned    Home Living Family/patient expects to be discharged to:: Private residence Living Arrangements: Children Available Help at Discharge: Family Type of Home: Apartment Home Access: Stairs to enter Entrance Stairs-Rails: Chemical engineer of Steps: 3 Home Layout: One level Home Equipment: Environmental consultant - 2 wheels;Cane - single point      Prior Function Level of Independence: Independent         Comments: pt reports she is independent with mobility and has been bathing by getting into tub. pt is lethargic and unclear at times, unsure how accurate all home set up and PLOF is.      Hand Dominance        Extremity/Trunk Assessment   Upper Extremity Assessment Upper Extremity Assessment: Generalized weakness;Defer to OT evaluation    Lower Extremity Assessment Lower Extremity Assessment: Generalized weakness    Cervical / Trunk Assessment Cervical / Trunk Assessment: Normal  Communication  Communication: No difficulties  Cognition Arousal/Alertness: Lethargic Behavior During Therapy: WFL for tasks assessed/performed Overall Cognitive Status: No family/caregiver present to determine baseline cognitive functioning           General Comments: pt lethargic  and making contradicting statements intermittently but able to answer questions appropriately. suspect due to anesthesia.      General Comments   Exercises     Assessment/Plan    PT Assessment Patient needs continued PT services  PT Problem List Decreased strength;Decreased activity tolerance;Decreased balance;Decreased mobility;Decreased knowledge of use of DME;Decreased knowledge of precautions;Decreased safety awareness;Pain;Obesity       PT Treatment Interventions DME instruction;Gait training;Stair training;Functional mobility training;Therapeutic activities;Therapeutic exercise;Balance training;Patient/family education    PT Goals (Current goals can be found in the Care Plan section)  Acute Rehab PT Goals Patient Stated Goal: none stated today, pt lethargic PT Goal Formulation: With patient Time For Goal Achievement: 08/03/19 Potential to Achieve Goals: Good    Frequency Min 3X/week    AM-PAC PT "6 Clicks" Mobility  Outcome Measure Help needed turning from your back to your side while in a flat bed without using bedrails?: A Little Help needed moving from lying on your back to sitting on the side of a flat bed without using bedrails?: A Lot Help needed moving to and from a bed to a chair (including a wheelchair)?: A Lot Help needed standing up from a chair using your arms (e.g., wheelchair or bedside chair)?: A Lot Help needed to walk in hospital room?: A Lot Help needed climbing 3-5 steps with a railing? : A Lot 6 Click Score: 13    End of Session Equipment Utilized During Treatment: Gait belt Activity Tolerance: Patient tolerated treatment well Patient left: in bed;with call bell/phone within reach;with bed alarm set Nurse Communication: Mobility status PT Visit Diagnosis: Muscle weakness (generalized) (M62.81);Difficulty in walking, not elsewhere classified (R26.2);Pain Pain - Right/Left:  (bil) Pain - part of body:  (buttock)    Time: 6147-0929 PT Time  Calculation (min) (ACUTE ONLY): 21 min   Charges:   PT Evaluation $PT Eval Moderate Complexity: 1 Mod         Verner Mould, DPT Acute Rehabilitation Services  Office [phone] Pager [phone]  07/20/2019 7:18 PM

## 2019-07-20 NOTE — Progress Notes (Signed)
[facility] Progress Note     Subjective: Patient reports pain and swelling in bilateral buttocks. Discussed plan for operative debridement with patient, who is in agreement. She asked that I update her daughter, Fraser Din, as well.   Objective: Vital signs in last 24 hours: Temp:  [98 F (36.7 C)-98.8 F (37.1 C)] 98 F (36.7 C) (06/17 0530) Pulse Rate:  [81-85] 85 (06/17 0530) Resp:  [15-20] 16 (06/17 0530) BP: (106-135)/(60-68) 131/66 (06/17 0530) SpO2:  [93 %-97 %] 97 % (06/17 0530) Last BM Date: 07/18/19  Intake/Output from previous day: 06/16 0701 - 06/17 0700 In: 1180 [P.O.:1080; IV Piggyback:100] Out: 800 [Urine:800] Intake/Output this shift: No intake/output data recorded.  PE: General: pleasant, WD, obese female who is laying in bed in NAD HEENT: Sclera are noninjected.  PERRL.  Ears and nose without any masses or lesions.  Mouth is pink and moist Heart: regular, rate, and rhythm.  Palpable radial and pedal pulses bilaterally Lungs: wheezing bilaterally.  Respiratory effort nonlabored Abd: soft, NT, ND, +BS, no masses, hernias, or organomegaly GU: buttocks are erythematous and indurated bilaterally - L>R, ttp MS: all 4 extremities are symmetrical with no cyanosis, clubbing, or edema. Skin: warm and dry with no masses, lesions, or rashes   Lab Results:  Recent Labs    07/19/19 0859 07/20/19 0522  WBC 17.4* 22.4*  HGB 9.7* 11.2*  HCT 28.8* 33.3*  PLT 199 225   BMET Recent Labs    07/19/19 0859 07/20/19 0522  NA 137 131*  K 4.8 5.8*  CL 105 102  CO2 21* 18*  GLUCOSE 103* 88  BUN 35* 37*  CREATININE 1.26* 1.43*  CALCIUM 9.8 9.6   PT/INR No results for input(s): LABPROT, INR in the last 72 hours. CMP     Component Value Date/Time   NA 131 (L) 07/20/2019 0522   NA 138 05/08/2019 1058   K 5.8 (H) 07/20/2019 0522   CL 102 07/20/2019 0522   CO2 18 (L) 07/20/2019 0522   GLUCOSE 88 07/20/2019 0522   BUN 37 (H) 07/20/2019 0522   BUN 22  05/08/2019 1058   CREATININE 1.43 (H) 07/20/2019 0522   CREATININE 0.72 08/14/2014 1554   CALCIUM 9.6 07/20/2019 0522   PROT 6.4 (L) 07/20/2019 0522   ALBUMIN 2.6 (L) 07/20/2019 0522   AST 23 07/20/2019 0522   ALT 19 07/20/2019 0522   ALKPHOS 84 07/20/2019 0522   BILITOT 1.0 07/20/2019 0522   GFRNONAA 34 (L) 07/20/2019 0522   GFRNONAA 81 08/14/2014 1554   GFRAA 39 (L) 07/20/2019 0522   GFRAA >89 08/14/2014 1554   Lipase     Component Value Date/Time   LIPASE 11 01/17/2016 0624       Studies/Results: CT PELVIS W CONTRAST  Result Date: 07/19/2019 CLINICAL DATA:  Left upper buttock gluteal cleft cellulitis with induration and erythema. EXAM: CT PELVIS WITH CONTRAST TECHNIQUE: Multidetector CT imaging of the pelvis was performed using the standard protocol following the bolus administration of intravenous contrast. CONTRAST:  65mL OMNIPAQUE IOHEXOL 300 MG/ML  SOLN COMPARISON:  Superficial pelvic ultrasound 07/17/2019. Pelvic CT 02/02/2018. FINDINGS: Urinary Tract: The visualized distal ureters and bladder appear unremarkable. Bowel: No bowel wall thickening, distention or surrounding inflammation identified within the pelvis. Sigmoid colon diverticulosis. Vascular/Lymphatic: No enlarged pelvic lymph nodes identified. Aortoiliac atherosclerosis without aneurysm or large vessel occlusion. Reproductive: Stable low-density left adnexal lesion measuring 3.0 x 2.1 cm on image 8/2, consistent with a benign cyst. There are stable adnexal calcifications  bilaterally. Probable 17 mm uterine fibroid on image 22/2. Other: Postsurgical changes in the anterior abdominal wall. No ascites. There is subcutaneous edema around the intergluteal fold with asymmetric inflammation and probable ill-defined fluid on the left, measuring up to 5.2 x 3.0 cm on image 45/2. This is likely contiguous with fluid extending along the posterior right aspect of the anus and measuring up to 4.9 x 1.6 cm on image 49/2. No air or  foreign bodies within these collections. Enteric contrast was not administered. No obvious overlying skin ulceration. Musculoskeletal: No acute or worrisome osseous findings. There are degenerative changes of both hips, sacroiliac joints in the lower lumbar spine. No evidence of osteomyelitis. IMPRESSION: 1. Subcutaneous edema around the intergluteal fold with asymmetric inflammatory and probable ill-defined fluid on the left, likely contiguous with fluid extending along the posterior right aspect of the anus. These findings are suspicious for cellulitis and probable early abscess formation. Etiology is not clearly demonstrated; underlying rectal fistula should be considered. 2. No evidence of osteomyelitis or intrapelvic inflammation. 3. Aortic Atherosclerosis (ICD10-I70.0). Electronically Signed   By: Richardean Sale M.D.   On: 07/19/2019 16:52    Anti-infectives: Anti-infectives (From admission, onward)   Start     Dose/Rate Route Frequency Ordered Stop   07/18/19 1200  cefTRIAXone (ROCEPHIN) 2 g in sodium chloride 0.9 % 100 mL IVPB     Discontinue     2 g 200 mL/hr over 30 Minutes Intravenous Every 24 hours 07/18/19 1100     07/15/19 2200  clindamycin (CLEOCIN) IVPB 600 mg  Status:  Discontinued        600 mg 100 mL/hr over 30 Minutes Intravenous Every 8 hours 07/15/19 1945 07/18/19 1101   07/15/19 1530  vancomycin (VANCOCIN) IVPB 1000 mg/200 mL premix        1,000 mg 200 mL/hr over 60 Minutes Intravenous  Once 07/15/19 1525 07/15/19 1755   07/15/19 1530  cefTRIAXone (ROCEPHIN) 2 g in sodium chloride 0.9 % 100 mL IVPB        2 g 200 mL/hr over 30 Minutes Intravenous  Once 07/15/19 1525 07/15/19 1647       Assessment/Plan T2DM - hold metformin x48h since contrast CT, SSI Hx of CVA Hx of MI HTN Hypothyroidism COPD CHF GERD  Perianal abscess tracking into L buttock - seen on CT - WBC increasing, afebrile - change abx to zosyn - bilateral buttocks are erythematous and indurated,  L>R - to OR later this AM for I&D - I called and updated patient's daughter on plans as well  FEN: NPO, IVF VTE: lovenox ID: rocephin 6/15>6/17; Zosyn 6/17>>  LOS: 5 days    Norm Parcel , [facility] 07/20/2019, 7:39 AM Please see Amion for pager number during day hours 7:00am-4:30pm

## 2019-07-20 NOTE — Progress Notes (Signed)
Patient ID: Caitlyn Aguirre, female   DOB: 06/05/1935, 84 y.o.   MRN: [id number]   Pre Procedure note for inpatients:   Caitlyn Aguirre has been scheduled for Procedure(s): IRRIGATION AND DEBRIDEMENT PERIANAL ABSCESS (N/A) today. The various methods of treatment have been discussed with the patient. After consideration of the risks, benefits and treatment options the patient has consented to the planned procedure.   The patient has been seen and labs reviewed. There are no changes in the patient's condition to prevent proceeding with the planned procedure today.  Recent labs:  Lab Results  Component Value Date   WBC 22.4 (H) 07/20/2019   HGB 11.2 (L) 07/20/2019   HCT 33.3 (L) 07/20/2019   PLT 225 07/20/2019   GLUCOSE 88 07/20/2019   CHOL 188 11/29/2017   TRIG 113 11/29/2017   HDL 57 11/29/2017   LDLCALC 108 (H) 11/29/2017   ALT 19 07/20/2019   AST 23 07/20/2019   NA 131 (L) 07/20/2019   K 5.8 (H) 07/20/2019   CL 102 07/20/2019   CREATININE 1.43 (H) 07/20/2019   BUN 37 (H) 07/20/2019   CO2 18 (L) 07/20/2019   TSH 1.330 05/08/2019   INR 1.1 07/15/2019   HGBA1C 9.2 (H) 07/16/2019   MICROALBUR 1.52 10/12/2013    Coralie Keens, MD 07/20/2019 10:04 AM

## 2019-07-21 ENCOUNTER — Encounter (HOSPITAL_COMMUNITY): Payer: Self-pay | Admitting: Surgery

## 2019-07-21 LAB — COMPREHENSIVE METABOLIC PANEL
ALT: 18 U/L (ref 0–44)
AST: 22 U/L (ref 15–41)
Albumin: 2.4 g/dL — ABNORMAL LOW (ref 3.5–5.0)
Alkaline Phosphatase: 77 U/L (ref 38–126)
Anion gap: 6 (ref 5–15)
BUN: 33 mg/dL — ABNORMAL HIGH (ref 8–23)
CO2: 23 mmol/L (ref 22–32)
Calcium: 9.2 mg/dL (ref 8.9–10.3)
Chloride: 101 mmol/L (ref 98–111)
Creatinine, Ser: 1.64 mg/dL — ABNORMAL HIGH (ref 0.44–1.00)
GFR calc Af Amer: 33 mL/min — ABNORMAL LOW (ref >60)
GFR calc non Af Amer: 29 mL/min — ABNORMAL LOW (ref >60)
Glucose, Bld: 109 mg/dL — ABNORMAL HIGH (ref 70–99)
Potassium: 4.8 mmol/L (ref 3.5–5.1)
Sodium: 130 mmol/L — ABNORMAL LOW (ref 135–145)
Total Bilirubin: 0.9 mg/dL (ref 0.3–1.2)
Total Protein: 6 g/dL — ABNORMAL LOW (ref 6.5–8.1)

## 2019-07-21 LAB — CBC WITH DIFFERENTIAL/PLATELET
Abs Immature Granulocytes: 0.77 10*3/uL — ABNORMAL HIGH (ref 0.00–0.07)
Basophils Absolute: 0.1 10*3/uL (ref 0.0–0.1)
Basophils Relative: 0 %
Eosinophils Absolute: 0.2 10*3/uL (ref 0.0–0.5)
Eosinophils Relative: 1 %
HCT: 25.4 % — ABNORMAL LOW (ref 36.0–46.0)
Hemoglobin: 8.7 g/dL — ABNORMAL LOW (ref 12.0–15.0)
Immature Granulocytes: 4 %
Lymphocytes Relative: 9 %
Lymphs Abs: 1.8 10*3/uL (ref 0.7–4.0)
MCH: 28.4 pg (ref 26.0–34.0)
MCHC: 34.3 g/dL (ref 30.0–36.0)
MCV: 83 fL (ref 80.0–100.0)
Monocytes Absolute: 1.3 10*3/uL — ABNORMAL HIGH (ref 0.1–1.0)
Monocytes Relative: 7 %
Neutro Abs: 15 10*3/uL — ABNORMAL HIGH (ref 1.7–7.7)
Neutrophils Relative %: 79 %
Platelets: 239 10*3/uL (ref 150–400)
RBC: 3.06 MIL/uL — ABNORMAL LOW (ref 3.87–5.11)
RDW: 15.5 % (ref 11.5–15.5)
WBC: 19 10*3/uL — ABNORMAL HIGH (ref 4.0–10.5)
nRBC: 0.1 % (ref 0.0–0.2)

## 2019-07-21 LAB — GLUCOSE, CAPILLARY
Glucose-Capillary: 103 mg/dL — ABNORMAL HIGH (ref 70–99)
Glucose-Capillary: 201 mg/dL — ABNORMAL HIGH (ref 70–99)
Glucose-Capillary: 251 mg/dL — ABNORMAL HIGH (ref 70–99)
Glucose-Capillary: 184 mg/dL — ABNORMAL HIGH (ref 70–99)

## 2019-07-21 MED ORDER — MORPHINE SULFATE (PF) 2 MG/ML IV SOLN
1.0000 mg | INTRAVENOUS | Status: DC | PRN
  Administered 2019-07-22 – 2019-07-24 (×5): 1 mg via INTRAVENOUS
  Filled 2019-07-21 (×5): qty 1

## 2019-07-21 MED ORDER — OXYCODONE HCL 5 MG PO TABS
10.0000 mg | ORAL_TABLET | ORAL | Status: DC | PRN
  Administered 2019-07-21 – 2019-07-24 (×6): 10 mg via ORAL
  Filled 2019-07-21 (×6): qty 2

## 2019-07-21 MED ORDER — SODIUM CHLORIDE 0.9 % IV BOLUS
250.0000 mL | Freq: Once | INTRAVENOUS | Status: AC
  Administered 2019-07-21: 250 mL via INTRAVENOUS

## 2019-07-21 MED ORDER — IBUPROFEN 400 MG PO TABS
600.0000 mg | ORAL_TABLET | Freq: Three times a day (TID) | ORAL | Status: DC
  Administered 2019-07-21 – 2019-07-24 (×8): 600 mg via ORAL
  Filled 2019-07-21 (×9): qty 1

## 2019-07-21 NOTE — Discharge Instructions (Signed)
Anorectal Abscess An abscess is an infected area that contains a collection of pus. An anorectal abscess is an abscess that is near the opening of the anus or around the rectum. Without treatment, an anorectal abscess can become larger and cause other problems, such as a more serious body-wide infection or pain, especially during bowel movements. What are the causes? This condition is caused by plugged glands or an infection in one of these areas:  The anus.  The area between the anus and the scrotum in males or between the anus and the vagina in females (perineum). What increases the risk? The following factors may make you more likely to develop this condition:  Diabetes or inflammatory bowel disease.  Having a body defense system (immune system) that is weak.  Engaging in anal sex.  Having a sexually transmitted infection (STI).  Certain kinds of cancer, such as rectal carcinoma, leukemia, or lymphoma. What are the signs or symptoms? The main symptom of this condition is pain. The pain may be a throbbing pain that gets worse during bowel movements. Other symptoms include:  Swelling and redness in the area of the abscess. The redness may go beyond the abscess and appear as a red streak on the skin.  A visible, painful lump, or a lump that can be felt when touched.  Bleeding or pus-like discharge from the area.  Fever.  General weakness.  Constipation.  Diarrhea. How is this diagnosed? This condition is diagnosed based on your medical history and a physical exam of the affected area.  This may involve examining the rectal area with a gloved hand (digital rectal exam).  Sometimes, the health care provider needs to look into the rectum using a probe, scope, or imaging test.  For women, it may require a careful vaginal exam. How is this treated? Treatment for this condition may include:  Incision and drainage surgery. This involves making an incision over the abscess to  drain the pus.  Medicines, including antibiotic medicine, pain medicine, stool softeners, or laxatives. Follow these instructions at home: Medicines  Take over-the-counter and prescription medicines only as told by your health care provider.  If you were prescribed an antibiotic medicine, use it as told by your health care provider. Do not stop using the antibiotic even if you start to feel better.  Do not drive or use heavy machinery while taking prescription pain medicine. Wound care   If gauze was used in the abscess, follow instructions from your health care provider about removing or changing the gauze. It can usually be removed in 2-3 days.  Wash your hands with soap and water before you remove or change your gauze. If soap and water are not available, use hand sanitizer.  If one or more drains were placed in the abscess cavity, be careful not to pull at them. Your health care provider will tell you how long they need to remain in place.  Check your incision area every day for signs of infection. Check for: ? More redness, swelling, or pain. ? More fluid or blood. ? Warmth. ? Pus or a bad smell. Managing pain, stiffness, and swelling   Take a sitz bath 3-4 times a day and after bowel movements. This will help reduce pain and swelling.  To relieve pain, try sitting: ? On a heating pad with the setting on low. ? On an inflatable donut-shaped cushion.  If directed, put ice on the affected area: ? Put ice in a plastic bag. ? Place   a towel between your skin and the bag. ? Leave the ice on for 20 minutes, 2-3 times a day. General instructions  Follow any diet instructions given by your health care provider.  Keep all follow-up visits as told by your health care provider. This is important. Contact a health care provider if you have:  Bleeding from your incision.  Pain, swelling, or redness that does not improve or gets worse.  Trouble passing stool or  urine.  Symptoms that return after treatment. Get help right away if you:  Have problems moving or using your legs.  Have severe or increasing pain.  Have swelling in the affected area that suddenly gets worse.  Have a large increase in bleeding or passing of pus.  Develop chills or a fever. Summary  An anorectal abscess is an abscess that is near the opening of the anus or around the rectum. An abscess is an infected area that contains a collection of pus.  The main symptom of this condition is pain. It may be a throbbing pain that gets worse during bowel movements.  Treatment for an anorectal abscess may include surgery to drain the pus from the abscess. Medicines and sitz baths may also be a part of your treatment plan. This information is not intended to replace advice given to you by your health care provider. Make sure you discuss any questions you have with your health care provider. Document Revised: 02/25/2017 Document Reviewed: 02/25/2017 Elsevier Patient Education  2020 [employer]  How to Take a Sitz Bath A sitz bath is a warm water bath that may be used to care for your rectum, genital area, or the area between your rectum and genitals (perineum). For a sitz bath, the water only comes up to your hips and covers your buttocks. A sitz bath may done at home in a bathtub or with a portable sitz bath that fits over the toilet. Your health care provider may recommend a sitz bath to help:  Relieve pain and discomfort after delivering a baby.  Relieve pain and itching from hemorrhoids or anal fissures.  Relieve pain after certain surgeries.  Relax muscles that are sore or tight. How to take a sitz bath Take 3-4 sitz baths a day, or as many as told by your health care provider. Bathtub sitz bath To take a sitz bath in a bathtub: 1. Partially fill a bathtub with warm water. The water should be deep enough to cover your hips and buttocks when you are sitting in the  tub. 2. If your health care provider told you to put medicine in the water, follow his or her instructions. 3. Sit in the water. 4. Open the tub drain a little, and leave it open during your bath. 5. Turn on the warm water again, enough to replace the water that is draining out. Keep the water running throughout your bath. This helps keep the water at the right level and the right temperature. 6. Soak in the water for 15-20 minutes, or as long as told by your health care provider. 7. When you are done, be careful when you stand up. You may feel dizzy. 8. After the sitz bath, pat yourself dry. Do not rub your skin to dry it.  Over-the-toilet sitz bath To take a sitz bath with an over-the-toilet basin: 1. Follow the manufacturer's instructions. 2. Fill the basin with warm water. 3. If your health care provider told you to put medicine in the water, follow his or   her instructions. 4. Sit on the seat. Make sure the water covers your buttocks and perineum. 5. Soak in the water for 15-20 minutes, or as long as told by your health care provider. 6. After the sitz bath, pat yourself dry. Do not rub your skin to dry it. 7. Clean and dry the basin between uses. 8. Discard the basin if it cracks, or according to the manufacturer's instructions. Contact a health care provider if:  Your symptoms get worse. Do not continue with sitz baths if your symptoms get worse.  You have new symptoms. If this happens, do not continue with sitz baths until you talk with your health care provider. Summary  A sitz bath is a warm water bath in which the water only comes up to your hips and covers your buttocks.  A sitz bath may help relieve itching, relieve pain, and relax muscles that are sore or tight in the lower part of your body, including your genital area.  Take 3-4 sitz baths a day, or as many as told by your health care provider. Soak in the water for 15-20 minutes.  Do not continue with sitz baths if  your symptoms get worse. This information is not intended to replace advice given to you by your health care provider. Make sure you discuss any questions you have with your health care provider. Document Revised: 06/20/2018 Document Reviewed: 01/21/2017 Elsevier Patient Education  2020 Elsevier Inc.   

## 2019-07-21 NOTE — Plan of Care (Signed)
Plan of care 

## 2019-07-21 NOTE — Evaluation (Signed)
Occupational Therapy Evaluation Patient Details Name: Caitlyn Aguirre MRN: [id number] DOB: [birth year] Today's Date: 07/21/2019    History of Present Illness Patient Admit 07/15/2019 from urgent care with hypoxia-on exam found to have erythematous indurated left upper buttock gluteal cleft cellulitis. Patient started on clindamycin and pain control. She is now s/p I&D of preianal Lt buttock abscess on 07/30/19. PMH significant for DM, OA, MI, HTN, GERD, COPD, CHF.   Clinical Impression   Patient with functional deficits listed below impacting safety and independence with self care. Patient require min A for functional transfer with rolling walker and bed mobility to lift LEs onto bed. Patient require max A for LB dressing seated in chair. Patient having difficulty with sitting in chair leaning heavily onto R side, safety concerns patient may lean too far over therefore RN/OT encourage patient get back to bed if unable to sit more upright. Upon standing pt incontinent of stool requiring total A from nursing to change dressing/peri care.     Follow Up Recommendations  SNF;Home health OT;Supervision/Assistance - 24 hour (HH pending progress + fam A)    Equipment Recommendations  Other (comment) (TBD)       Precautions / Restrictions Precautions Precautions: Fall Precaution Comments: penrose drain in Rt buttock Restrictions Weight Bearing Restrictions: No      Mobility Bed Mobility Overal bed mobility: Needs Assistance Bed Mobility: Sit to Supine;Rolling Rolling: Supervision   Supine to sit: HOB elevated;Mod assist Sit to supine: Min assist   General bed mobility comments: min A to lift LEs onto EOB, cues for rolling to complete peri care  Transfers Overall transfer level: Needs assistance Equipment used: Rolling walker (2 wheeled) Transfers: Sit to/from Stand Sit to Stand: Min assist         General transfer comment: patient require verbal cues for hand placement with walker and  min A to safely transfer back to edge of bed due to mild posterior lean    Balance Overall balance assessment: Needs assistance Sitting-balance support: Bilateral upper extremity supported;Feet supported Sitting balance-Leahy Scale: Fair     Standing balance support: During functional activity;Bilateral upper extremity supported Standing balance-Leahy Scale: Poor Standing balance comment: reliant on external support                           ADL either performed or assessed with clinical judgement   ADL Overall ADL's : Needs assistance/impaired     Grooming: Wash/dry hands;Set up;Bed level   Upper Body Bathing: Supervision/ safety;Sitting   Lower Body Bathing: Moderate assistance;Maximal assistance;Sitting/lateral leans;Sit to/from stand   Upper Body Dressing : Supervision/safety;Sitting   Lower Body Dressing: Maximal assistance;Sitting/lateral leans Lower Body Dressing Details (indicate cue type and reason): patient able to doff L sock, increased pain with trying to reach to doff R sock. requiring assist to doff R sock and don clean socks Toilet Transfer: Minimal assistance;Ambulation;RW;Cueing for safety Toilet Transfer Details (indicate cue type and reason): transfer from recliner to bed, cues for hand placement, requires increased time Toileting- Clothing Manipulation and Hygiene: Total assistance;Sit to/from stand;Bed level Toileting - Clothing Manipulation Details (indicate cue type and reason): upon standing pt incontinent of stool, require total A for peri care especially due to drain at buttock     Functional mobility during ADLs: Minimal assistance;Rolling walker;Cueing for sequencing;Cueing for safety General ADL Comments: patient requiring increased assistance with self care due to pain, decreased activity tolerance, safety awareness.  Pertinent Vitals/Pain Pain Assessment: Faces Faces Pain Scale: Hurts whole lot Pain Location:  buttock, esp with peri care Pain Descriptors / Indicators: Discomfort;Grimacing;Guarding Pain Intervention(s): Patient requesting pain meds-RN notified     Hand Dominance Right   Extremity/Trunk Assessment Upper Extremity Assessment Upper Extremity Assessment: Generalized weakness   Lower Extremity Assessment Lower Extremity Assessment: Defer to PT evaluation   Cervical / Trunk Assessment Cervical / Trunk Assessment: Normal   Communication Communication Communication: No difficulties   Cognition Arousal/Alertness: Awake/alert Behavior During Therapy: WFL for tasks assessed/performed Overall Cognitive Status: No family/caregiver present to determine baseline cognitive functioning                                 General Comments: patient alert, does appear to require increase time to respond to questions and initiate tasks.              Home Living Family/patient expects to be discharged to:: Private residence Living Arrangements: Children Available Help at Discharge: Family Type of Home: Apartment Home Access: Stairs to enter Technical brewer of Steps: 3 Entrance Stairs-Rails: Left;Right Home Layout: One level     Bathroom Shower/Tub: Teacher, early years/pre: Standard Bathroom Accessibility: Yes How Accessible: Accessible via walker Home Equipment: [place] - 2 wheels;Cane - single point          Prior Functioning/Environment Level of Independence: Independent        Comments: patient reports she is typically independent and does not require assist from children for BADLs.        OT Problem List: Decreased activity tolerance;Pain;Obesity;Impaired balance (sitting and/or standing);Decreased safety awareness;Decreased strength      OT Treatment/Interventions: Self-care/ADL training;Therapeutic exercise;Energy conservation;DME and/or AE instruction;Therapeutic activities;Patient/family education;Balance training    OT  Goals(Current goals can be found in the care plan section) Acute Rehab OT Goals Patient Stated Goal: less pain OT Goal Formulation: With patient Time For Goal Achievement: 08/04/19 Potential to Achieve Goals: Good  OT Frequency: Min 2X/week   Barriers to D/C:    unsure of level of caregiver support patient will have at home          AM-PAC OT "6 Clicks" Daily Activity     Outcome Measure Help from another person eating meals?: None Help from another person taking care of personal grooming?: A Little Help from another person toileting, which includes using toliet, bedpan, or urinal?: A Lot Help from another person bathing (including washing, rinsing, drying)?: A Lot Help from another person to put on and taking off regular upper body clothing?: A Little Help from another person to put on and taking off regular lower body clothing?: A Lot 6 Click Score: 16   End of Session Equipment Utilized During Treatment: Rolling walker Nurse Communication: Mobility status;Patient requests pain meds  Activity Tolerance: Patient limited by pain Patient left: in bed;with nursing/sitter in room;with call bell/phone within reach  OT Visit Diagnosis: Unsteadiness on feet (R26.81);Other abnormalities of gait and mobility (R26.89);Muscle weakness (generalized) (M62.81);Pain Pain - part of body:  (buttock)                Time: 2595-6387 OT Time Calculation (min): 26 min Charges:  OT General Charges $OT Visit: 1 Visit OT Evaluation $OT Eval Moderate Complexity: 1 Mod OT Treatments $Self Care/Home Management : 8-22 mins  Delbert Phenix OT Pager: [phone]  Rosemary Holms 07/21/2019, 3:09 PM

## 2019-07-21 NOTE — Progress Notes (Addendum)
PROGRESS NOTE  Caitlyn Aguirre  UJW:[id number] DOB: [birth year] DOA: 07/15/2019  PCP: Axel Filler, MD  TEACHING SERVICE PATIENT--- we will CC PCP for outpatient follow-up  Chief Complaint  Patient presents with  . Fever  . Shortness of Breath  . Abscess   Brief Narrative:  84 year old black female Prior tobacco quit in [place] stage II-III COPD on home oxygen 2 L (07/18/2011 FEV/FVC 78% FEV1 86%)  DM TY 2, Probable polymyalgia rheumatica HFpEF echocardiogram 07/11/2018 55-60% EF, PASP 38 mm  HTN, hypothyroidism CAD status post DES-low risk stress test 10/31/2014 TIA Status post lap chole 01/18/2016  Admit 07/15/2019 from urgent care with hypoxia-on exam found to have erythematous indurated left upper buttock gluteal cleft cellulitis Started on clindamycin and pain control Developed some low blood pressures  Ultimately was found to have an abscess and general surgery performed an I&D on this She currently has moderate pain and we are waiting on cultures to disposition her properly   Assessment & Plan:   Active Problems:   Hypothyroidism   Type 2 diabetes mellitus with other specified complication (HCC)   Essential hypertension   COPD (HCC)   Polymyalgia rheumatica (HCC)   CKD (chronic kidney disease) stage 3, GFR 30-59 ml/min   Cellulitis of buttock, left   Acute on chronic respiratory failure with hypoxia ([place])   1. [place] cleft cellulitis + Asbcess status post incision and drainage Dr. Mercy Riding 07/20/2019 a. CT scan 6/16 = abscess--appreciate Dr. Ninfa Linden input and surgery 6/17 b.  for some reason saline bolus given 6/17 for low blood pressure but pressures now seem to have resolved-patient is completely asymptomatic from this at this time c. Follow wound/deep cultures and narrow appropriately d. With Penrose drain debility and advanced age suspect might require SNF--greatly appreciate therapy input-they have seen her twice and feel that with her  current situation, she may meet goals and may attempt stairclimbing on 6/20 or 6/21 and meet those goals to be able to discharge home-we will monitor how she does while awaiting cultures e. Pain is not very well controlled she may need adjustment of the pain regimen over the next 1 to 2 days- f. note that sitz bath have been ordered per general surgery and RN is aware of the same 2. COPD stage III-IV-- oxygen 2 L and will need to be on this on discharge  a. Outpatient PFts per PCP b. Continue meds albuterol 2.5 Neb 3. Acute blood loss anemia a. Secondary to surgery and volume resuscitation b. Recheck labs a.m. as could be hemodilution in addition 4. AKI superimposed on admission on CKD 3B  Mild metabolic acidosis   hypomagnesemia, and hyperkalemia a. Holding diuretic antihypertensives at this time b. Continue Coreg 25 twice daily (unusually high dose) c. Start replacement with Mag-Ox  400 twice daily 5. Polymyalgia rheumatica a. Continuing prednisone 17.5 b. ?  Lower dose metformin in the outpatient setting as below 6. DM TY 2 a. ? D/c Metformin eventually b. Currently on sliding scale coverage sugars are well controlled c. Goal A1c probably above 8.0 given advanced age etc. 49. HFpEF with low risk stress test 2016 a. Continue goal-directed therapy Coreg 25 twice daily continue amlodipine 5-Hygroton/lisinopril have been held  8. Prior TIA a. On aspirin 81 b. Will need lipid panel 3 to 6 months also continue Lipitor 40 9. Prior lap chole 10. Hypothyroid a. Continue levothyroxine 88 mcg b. Consider TSH in 3 to 4 weeks   DVT prophylaxis: lovenox  Code Status: Full Called daughter on phone 6/18 at [phone] answer will call again a.m. Disposition:   Status is: Inpatient Remains inpatient appropriate because:Ongoing diagnostic testing needed not appropriate for outpatient work up and IV treatments appropriate due to intensity of illness or inability to take PO Dispo: The patient is  from: Home              Anticipated d/c is to: SNF              Anticipated d/c date is: 3 days              Patient currently is not medically stable to d/c.   Consultants:   None yet  Procedures: [place] x 2  Antimicrobials: Ceftriaxone 2 g since 6/15 Zosyn started on 6/17.  Subjective:  Well  pain is controlled to some degree She however needs pain meds It looks like surgery and daughter as well as nursing changed her dressings and the wounds are clean-I did not disturb the dressing today  Objective: Vitals:   07/21/19 0536 07/21/19 0840 07/21/19 1058 07/21/19 1338  BP: (!) 87/57 121/76 (!) 116/37 (!) 123/57  Pulse: 73 76 67 76  Resp: 18   18  Temp: 98.3 F (36.8 C)   98.7 F (37.1 C)  TempSrc: Oral     SpO2: 95%   94%  Weight:      Height:        Intake/Output Summary (Last 24 hours) at 07/21/2019 1519 Last data filed at 07/21/2019 1023 Gross per 24 hour  Intake 2283.6 ml  Output 950 ml  Net 1333.6 ml   Filed Weights   07/15/19 1626  Weight: 93.4 kg    Examination: Awake alert in nad, on oxygen Ct ab no added sound abd soft-Dressing on buttocks Neuro intact Coherent   Data Reviewed: I have personally reviewed following labs and imaging studies Potassium up from 4.8-5.8-->4.8 CO2 down from 21-18--->21 BUN/creatinine 37/1.4 eGFR = 39 White count down from 22-19 Hemoglobin 11.2-->8.7 platelets 225-->239  Radiology Studies: CT PELVIS W CONTRAST  Result Date: 07/19/2019 CLINICAL DATA:  Left upper buttock gluteal cleft cellulitis with induration and erythema. EXAM: CT PELVIS WITH CONTRAST TECHNIQUE: Multidetector CT imaging of the pelvis was performed using the standard protocol following the bolus administration of intravenous contrast. CONTRAST:  44m OMNIPAQUE IOHEXOL 300 MG/ML  SOLN COMPARISON:  Superficial pelvic ultrasound 07/17/2019. Pelvic CT 02/02/2018. FINDINGS: Urinary Tract: The visualized distal ureters and bladder appear unremarkable. Bowel: No  bowel wall thickening, distention or surrounding inflammation identified within the pelvis. Sigmoid colon diverticulosis. Vascular/Lymphatic: No enlarged pelvic lymph nodes identified. Aortoiliac atherosclerosis without aneurysm or large vessel occlusion. Reproductive: Stable low-density left adnexal lesion measuring 3.0 x 2.1 cm on image 8/2, consistent with a benign cyst. There are stable adnexal calcifications bilaterally. Probable 17 mm uterine fibroid on image 22/2. Other: Postsurgical changes in the anterior abdominal wall. No ascites. There is subcutaneous edema around the intergluteal fold with asymmetric inflammation and probable ill-defined fluid on the left, measuring up to 5.2 x 3.0 cm on image 45/2. This is likely contiguous with fluid extending along the posterior right aspect of the anus and measuring up to 4.9 x 1.6 cm on image 49/2. No air or foreign bodies within these collections. Enteric contrast was not administered. No obvious overlying skin ulceration. Musculoskeletal: No acute or worrisome osseous findings. There are degenerative changes of both hips, sacroiliac joints in the lower lumbar spine. No evidence of osteomyelitis. IMPRESSION: 1.  Subcutaneous edema around the intergluteal fold with asymmetric inflammatory and probable ill-defined fluid on the left, likely contiguous with fluid extending along the posterior right aspect of the anus. These findings are suspicious for cellulitis and probable early abscess formation. Etiology is not clearly demonstrated; underlying rectal fistula should be considered. 2. No evidence of osteomyelitis or intrapelvic inflammation. 3. Aortic Atherosclerosis (ICD10-I70.0). Electronically Signed   By: Richardean Sale M.D.   On: 07/19/2019 16:52      Scheduled Meds: . aspirin EC  81 mg Oral Daily  . atorvastatin  40 mg Oral q1800  . carvedilol  25 mg Oral BID WC  . chlorhexidine  15 mL Mouth/Throat Once  . enoxaparin (LOVENOX) injection  40 mg  Subcutaneous Q24H  . guaiFENesin  600 mg Oral BID  . ibuprofen  600 mg Oral TID  . insulin aspart  0-5 Units Subcutaneous QHS  . insulin aspart  0-9 Units Subcutaneous TID WC  . latanoprost  1 drop Both Eyes QHS  . levothyroxine  88 mcg Oral Q0600  . magnesium oxide  400 mg Oral BID  . PARoxetine  30 mg Oral Daily  . predniSONE  17.5 mg Oral Q breakfast   Continuous Infusions: . lactated ringers 50 mL/hr at 07/21/19 1308  . piperacillin-tazobactam (ZOSYN)  IV 3.375 g (07/21/19 0657)    LOS: 6 days   Time spent: [place], MD [facility]   To contact the attending provider between 7A-7P or the covering provider during after hours 7P-7A, please log into the web site [email or web address] and access using universal [place] password for that web site. If you do not have the password, please call the hospital operator.  07/21/2019, 3:19 PM

## 2019-07-21 NOTE — Progress Notes (Signed)
Physical Therapy Treatment Patient Details Name: Caitlyn Aguirre MRN: [id number] DOB: 07/25/1935 Today's Date: 07/21/2019    History of Present Illness Patient Admit 07/15/2019 from urgent care with hypoxia-on exam found to have erythematous indurated left upper buttock gluteal cleft cellulitis. Patient started on clindamycin and pain control. She is now s/p I&D of preianal Lt buttock abscess on 07/30/19. PMH significant for DM, OA, MI, HTN, GERD, COPD, CHF.    PT Comments    Patient is making good progress with PT. Pt more alert today and followed directions/cues for bed mobility well. She is limited with bed mobility due to buttock pain and requires increased time to mobilize. Patient required min assist to complete sit<>stand transfers from EOB x2. Brief donned in sitting due to urinary incontinence. Pt amb ~ 21' today with min assist for walker management and SpO2 was 98% on 2L/min and 95% on 1L/min. Pt dropped to 84% on RA. Pt requesting to sit in recliner and requried mod assist to shift and reposition in chair for comfort. Educated to call for NT/RN to help her back to bed and that she should not sit for >30-60 minutes due to surgical site. Acute PT will continue to follow and progress as able. Continue to recommend SNF at this time.    Follow Up Recommendations  SNF;Home health PT (SNF vs HHPT depending on pt progress and family support)     Equipment Recommendations  Rolling walker with 5" wheels    Recommendations for Other Services OT consult     Precautions / Restrictions Precautions Precautions: Fall Precaution Comments: penrose drain in Rt buttock Restrictions Weight Bearing Restrictions: No    Mobility  Bed Mobility Overal bed mobility: Needs Assistance Bed Mobility: Supine to Sit     Supine to sit: HOB elevated;Mod assist     General bed mobility comments: pt requried cues to bring LE's off EOB and tactile cues at knees to flex them. VC's to reach to bed rail and  use UE's to push up and sit EOB. pt taking extra time and having difficulty shifting weight due to pain.  Transfers Overall transfer level: Needs assistance Equipment used: Rolling walker (2 wheeled) Transfers: Sit to/from Stand Sit to Stand: Min assist;From elevated surface         General transfer comment: cues for technique with RW, pt using single UE to press up from EOB and rise. min assist to steady with power up. pt incontinent of urine on standing. mesh underpants and brief donned with pt sitting and additional stand performed to ambulate.   Ambulation/Gait Ambulation/Gait assistance: Min assist;+2 safety/equipment (chair follow) Gait Distance (Feet): 65 Feet Assistive device: Rolling walker (2 wheeled) Gait Pattern/deviations: Step-through pattern;Decreased stride length;Wide base of support Gait velocity: decreased   General Gait Details: cues to maintain safe proximity to RW. no overt LOB noted. pt on 2L/min and SpO2 at 98%, dropped to 1L/min and SpO2 remained at 94%.   Stairs             Wheelchair Mobility    Modified Rankin (Stroke Patients Only)       Balance Overall balance assessment: Needs assistance Sitting-balance support: Feet supported;Bilateral upper extremity supported;Single extremity supported Sitting balance-Leahy Scale: Fair     Standing balance support: During functional activity;Bilateral upper extremity supported Standing balance-Leahy Scale: Fair Standing balance comment: able to maintain static standing to assist with pulling up brief             Cognition Arousal/Alertness: Awake/alert Behavior  During Therapy: WFL for tasks assessed/performed Overall Cognitive Status: No family/caregiver present to determine baseline cognitive functioning        General Comments: pt more alert today and answering questions more clearly. Oriented to self, place, situation, requried cues for date/time. pt pleasant and happy to mobilize.       Exercises      General Comments        Pertinent Vitals/Pain Pain Assessment: Faces Faces Pain Scale: Hurts even more Pain Location: buttock Pain Descriptors / Indicators: Discomfort;Grimacing;Guarding Pain Intervention(s): Limited activity within patient's tolerance;Monitored during session;Repositioned           PT Goals (current goals can now be found in the care plan section) Acute Rehab PT Goals Patient Stated Goal: none stated today, pt lethargic PT Goal Formulation: With patient Time For Goal Achievement: 08/03/19 Potential to Achieve Goals: Good Progress towards PT goals: Progressing toward goals    Frequency    Min 3X/week      PT Plan Current plan remains appropriate       AM-PAC PT "6 Clicks" Mobility   Outcome Measure  Help needed turning from your back to your side while in a flat bed without using bedrails?: A Little Help needed moving from lying on your back to sitting on the side of a flat bed without using bedrails?: A Lot Help needed moving to and from a bed to a chair (including a wheelchair)?: A Little Help needed standing up from a chair using your arms (e.g., wheelchair or bedside chair)?: A Little Help needed to walk in hospital room?: A Lot Help needed climbing 3-5 steps with a railing? : A Lot 6 Click Score: 15    End of Session Equipment Utilized During Treatment: Gait belt Activity Tolerance: Patient tolerated treatment well Patient left: in bed;with call bell/phone within reach;with bed alarm set Nurse Communication: Mobility status PT Visit Diagnosis: Muscle weakness (generalized) (M62.81);Difficulty in walking, not elsewhere classified (R26.2);Pain Pain - Right/Left:  (bil) Pain - part of body:  (buttock)     Time: 5916-3846 PT Time Calculation (min) (ACUTE ONLY): 35 min  Charges:  $Gait Training: 8-22 mins $Therapeutic Activity: 8-22 mins                    Verner Mould, DPT Acute Rehabilitation Services  Office  [phone] Pager [phone]  07/21/2019 2:21 PM

## 2019-07-21 NOTE — Progress Notes (Signed)
[facility] Progress Note  1 Day Post-Op  Subjective: Pain in left buttock. Patient was up having a BM on my arrival. Daughter, Stanton Kidney, present at bedside. Changed dressing with RN and reviewed wound care with daughter.   Objective: Vital signs in last 24 hours: Temp:  [98.3 F (36.8 C)-101.1 F (38.4 C)] 98.3 F (36.8 C) (06/18 0536) Pulse Rate:  [72-86] 73 (06/18 0536) Resp:  [16-23] 18 (06/18 0536) BP: (87-142)/(51-117) 87/57 (06/18 0536) SpO2:  [92 %-98 %] 95 % (06/18 0536) Last BM Date: 07/18/19  Intake/Output from previous day: 06/17 0701 - 06/18 0700 In: 2893.6 [P.O.:1140; I.V.:1653.6; IV Piggyback:100] Out: 1000 [Urine:950; Blood:50] Intake/Output this shift: No intake/output data recorded.  PE: GU: cellulitis of bilateral buttocks improving, packing removed from L buttock wound with some bloody-purulent drainage present, penrose drain in R buttock   Lab Results:  Recent Labs    07/20/19 0522 07/21/19 0510  WBC 22.4* 19.0*  HGB 11.2* 8.7*  HCT 33.3* 25.4*  PLT 225 239   BMET Recent Labs    07/20/19 1529 07/21/19 0510  NA 130* 130*  K 4.8 4.8  CL 99 101  CO2 21* 23  GLUCOSE 202* 109*  BUN 37* 33*  CREATININE 1.47* 1.64*  CALCIUM 9.1 9.2   PT/INR No results for input(s): LABPROT, INR in the last 72 hours. CMP     Component Value Date/Time   NA 130 (L) 07/21/2019 0510   NA 138 05/08/2019 1058   K 4.8 07/21/2019 0510   CL 101 07/21/2019 0510   CO2 23 07/21/2019 0510   GLUCOSE 109 (H) 07/21/2019 0510   BUN 33 (H) 07/21/2019 0510   BUN 22 05/08/2019 1058   CREATININE 1.64 (H) 07/21/2019 0510   CREATININE 0.72 08/14/2014 1554   CALCIUM 9.2 07/21/2019 0510   PROT 6.0 (L) 07/21/2019 0510   ALBUMIN 2.4 (L) 07/21/2019 0510   AST 22 07/21/2019 0510   ALT 18 07/21/2019 0510   ALKPHOS 77 07/21/2019 0510   BILITOT 0.9 07/21/2019 0510   GFRNONAA 29 (L) 07/21/2019 0510   GFRNONAA 81 08/14/2014 1554   GFRAA 33 (L) 07/21/2019 0510   GFRAA  >89 08/14/2014 1554   Lipase     Component Value Date/Time   LIPASE 11 01/17/2016 0624       Studies/Results: CT PELVIS W CONTRAST  Result Date: 07/19/2019 CLINICAL DATA:  Left upper buttock gluteal cleft cellulitis with induration and erythema. EXAM: CT PELVIS WITH CONTRAST TECHNIQUE: Multidetector CT imaging of the pelvis was performed using the standard protocol following the bolus administration of intravenous contrast. CONTRAST:  38mL OMNIPAQUE IOHEXOL 300 MG/ML  SOLN COMPARISON:  Superficial pelvic ultrasound 07/17/2019. Pelvic CT 02/02/2018. FINDINGS: Urinary Tract: The visualized distal ureters and bladder appear unremarkable. Bowel: No bowel wall thickening, distention or surrounding inflammation identified within the pelvis. Sigmoid colon diverticulosis. Vascular/Lymphatic: No enlarged pelvic lymph nodes identified. Aortoiliac atherosclerosis without aneurysm or large vessel occlusion. Reproductive: Stable low-density left adnexal lesion measuring 3.0 x 2.1 cm on image 8/2, consistent with a benign cyst. There are stable adnexal calcifications bilaterally. Probable 17 mm uterine fibroid on image 22/2. Other: Postsurgical changes in the anterior abdominal wall. No ascites. There is subcutaneous edema around the intergluteal fold with asymmetric inflammation and probable ill-defined fluid on the left, measuring up to 5.2 x 3.0 cm on image 45/2. This is likely contiguous with fluid extending along the posterior right aspect of the anus and measuring up to 4.9 x 1.6 cm on  image 49/2. No air or foreign bodies within these collections. Enteric contrast was not administered. No obvious overlying skin ulceration. Musculoskeletal: No acute or worrisome osseous findings. There are degenerative changes of both hips, sacroiliac joints in the lower lumbar spine. No evidence of osteomyelitis. IMPRESSION: 1. Subcutaneous edema around the intergluteal fold with asymmetric inflammatory and probable  ill-defined fluid on the left, likely contiguous with fluid extending along the posterior right aspect of the anus. These findings are suspicious for cellulitis and probable early abscess formation. Etiology is not clearly demonstrated; underlying rectal fistula should be considered. 2. No evidence of osteomyelitis or intrapelvic inflammation. 3. Aortic Atherosclerosis (ICD10-I70.0). Electronically Signed   By: Richardean Sale M.D.   On: 07/19/2019 16:52    Anti-infectives: Anti-infectives (From admission, onward)   Start     Dose/Rate Route Frequency Ordered Stop   07/20/19 0800  piperacillin-tazobactam (ZOSYN) IVPB 3.375 g     Discontinue     3.375 g 12.5 mL/hr over 240 Minutes Intravenous Every 8 hours 07/20/19 0750     07/20/19 0745  piperacillin-tazobactam (ZOSYN) IVPB 3.375 g  Status:  Discontinued        3.375 g 100 mL/hr over 30 Minutes Intravenous Every 8 hours 07/20/19 0744 07/20/19 0750   07/18/19 1200  cefTRIAXone (ROCEPHIN) 2 g in sodium chloride 0.9 % 100 mL IVPB  Status:  Discontinued        2 g 200 mL/hr over 30 Minutes Intravenous Every 24 hours 07/18/19 1100 07/20/19 0744   07/15/19 2200  clindamycin (CLEOCIN) IVPB 600 mg  Status:  Discontinued        600 mg 100 mL/hr over 30 Minutes Intravenous Every 8 hours 07/15/19 1945 07/18/19 1101   07/15/19 1530  vancomycin (VANCOCIN) IVPB 1000 mg/200 mL premix        1,000 mg 200 mL/hr over 60 Minutes Intravenous  Once 07/15/19 1525 07/15/19 1755   07/15/19 1530  cefTRIAXone (ROCEPHIN) 2 g in sodium chloride 0.9 % 100 mL IVPB        2 g 200 mL/hr over 30 Minutes Intravenous  Once 07/15/19 1525 07/15/19 1647       Assessment/Plan T2DM - hold metformin x48h since contrast CT, SSI Hx of CVA Hx of MI HTN Hypothyroidism COPD CHF GERD  Perianal abscess tracking into L buttock S/p I&D 6/18 Dr. Ninfa Linden - POD#1 - packing removed with some bloody/purulent material - continue dressing changes BID or prn for soiling - sitz  baths - we will see again 6/21, but if patient is discharged over the weekend will see back in the office for wound check - I will schedule follow up just in case  FEN: NPO, IVF VTE: lovenox ID: [email or web address] 6/15>6/17; Zosyn 6/17>>  LOS: 6 days    Norm Parcel , [facility] 07/21/2019, 9:02 AM Please see Amion for pager number during day hours 7:00am-4:30pm

## 2019-07-21 NOTE — TOC Progression Note (Addendum)
Transition of Care [facility]) - Progression Note    Patient Details  Name: CLIFFIE GINGRAS MRN: [id number] Date of Birth: 1935-06-05  Transition of Care [facility]) CM/SW [place], [place] Phone Number: 07/21/2019, 3:42 PM  Clinical Narrative:    Patient and daughter decline skilled nursing facility placement. Daughter reports she will complete wound care at home. She reports she has watched the nurses complete the dressing changes. CSW offered choice, daughter chose [facility]. CSW reached out to [facility], they will accept the patient for PT/RN services. Start of Care will be Tuesday.   Patient daughter will need to call [employer] to arranged payment plan. Patient understands she cannot get DME until this is completed, this does not include her oxygen. Daughter reports a Merchant navy officer from [place]  came out to the house on Monday and replaced the oxygen tanks.   Travel tank at bedside. Daughter will transport the patient by car.  TOC staff will continue to follow this patient for discharge needs.    Expected Discharge Plan: Home/Self Care Barriers to Discharge: Continued Medical Work up  Expected Discharge Plan and Services Expected Discharge Plan: Home/Self Care In-house Referral: Clinical Social Work Discharge Planning Services: CM Consult Post Acute Care Choice: Home Health, Durable Medical Equipment Living arrangements for the past 2 months: Single Family Home                 DME Arranged: Walker rolling with seat, Oxygen DME Agency: [employer] Date DME Agency Contacted: 07/17/19 Time DME Agency Contacted: [phone] Representative spoke with at DME Agency: Andree Coss HH Arranged: PT, RN [place] Agency: [place] Date [place]: 07/21/19 Time [place]: [place] Representative spoke with at [place]: Cindie   Social Determinants of Health ([place]) Interventions    Readmission Risk Interventions No flowsheet data found.

## 2019-07-22 LAB — COMPREHENSIVE METABOLIC PANEL
ALT: 19 U/L (ref 0–44)
AST: 22 U/L (ref 15–41)
Albumin: 2.6 g/dL — ABNORMAL LOW (ref 3.5–5.0)
Alkaline Phosphatase: 89 U/L (ref 38–126)
Anion gap: 8 (ref 5–15)
BUN: 34 mg/dL — ABNORMAL HIGH (ref 8–23)
CO2: 26 mmol/L (ref 22–32)
Calcium: 9.8 mg/dL (ref 8.9–10.3)
Chloride: 100 mmol/L (ref 98–111)
Creatinine, Ser: 1.42 mg/dL — ABNORMAL HIGH (ref 0.44–1.00)
GFR calc Af Amer: 40 mL/min — ABNORMAL LOW (ref >60)
GFR calc non Af Amer: 34 mL/min — ABNORMAL LOW (ref >60)
Glucose, Bld: 121 mg/dL — ABNORMAL HIGH (ref 70–99)
Potassium: 4.8 mmol/L (ref 3.5–5.1)
Sodium: 134 mmol/L — ABNORMAL LOW (ref 135–145)
Total Bilirubin: 0.8 mg/dL (ref 0.3–1.2)
Total Protein: 6.5 g/dL (ref 6.5–8.1)

## 2019-07-22 LAB — CBC WITH DIFFERENTIAL/PLATELET
Abs Immature Granulocytes: 0.95 10*3/uL — ABNORMAL HIGH (ref 0.00–0.07)
Basophils Absolute: 0.1 10*3/uL (ref 0.0–0.1)
Basophils Relative: 0 %
Eosinophils Absolute: 0.1 10*3/uL (ref 0.0–0.5)
Eosinophils Relative: 1 %
HCT: 29.6 % — ABNORMAL LOW (ref 36.0–46.0)
Hemoglobin: 9.6 g/dL — ABNORMAL LOW (ref 12.0–15.0)
Immature Granulocytes: 5 %
Lymphocytes Relative: 7 %
Lymphs Abs: 1.5 10*3/uL (ref 0.7–4.0)
MCH: 27.7 pg (ref 26.0–34.0)
MCHC: 32.4 g/dL (ref 30.0–36.0)
MCV: 85.3 fL (ref 80.0–100.0)
Monocytes Absolute: 1.3 10*3/uL — ABNORMAL HIGH (ref 0.1–1.0)
Monocytes Relative: 6 %
Neutro Abs: 17.2 10*3/uL — ABNORMAL HIGH (ref 1.7–7.7)
Neutrophils Relative %: 81 %
Platelets: 285 10*3/uL (ref 150–400)
RBC: 3.47 MIL/uL — ABNORMAL LOW (ref 3.87–5.11)
RDW: 15.7 % — ABNORMAL HIGH (ref 11.5–15.5)
WBC: 21.1 10*3/uL — ABNORMAL HIGH (ref 4.0–10.5)
nRBC: 0 % (ref 0.0–0.2)

## 2019-07-22 LAB — GLUCOSE, CAPILLARY
Glucose-Capillary: 118 mg/dL — ABNORMAL HIGH (ref 70–99)
Glucose-Capillary: 195 mg/dL — ABNORMAL HIGH (ref 70–99)
Glucose-Capillary: 241 mg/dL — ABNORMAL HIGH (ref 70–99)
Glucose-Capillary: 198 mg/dL — ABNORMAL HIGH (ref 70–99)

## 2019-07-22 LAB — MAGNESIUM: Magnesium: 2.2 mg/dL (ref 1.7–2.4)

## 2019-07-22 NOTE — Progress Notes (Signed)
PROGRESS NOTE  Caitlyn Aguirre  MLJ:[id number] DOB: February 01, 1936 DOA: 07/15/2019  PCP: Axel Filler, MD  TEACHING SERVICE PATIENT--- we will CC PCP for outpatient follow-up  Chief Complaint  Patient presents with  . Fever  . Shortness of Breath  . Abscess   Brief Narrative:  84 year old black female Prior tobacco quit in [place] stage II-III COPD on home oxygen 2 L (07/18/2011 FEV/FVC 78% FEV1 86%)  DM TY 2, Probable polymyalgia rheumatica HFpEF echocardiogram 07/11/2018 55-60% EF, PASP 38 mm  HTN, hypothyroidism CAD status post DES-low risk stress test 10/31/2014 TIA Status post lap chole 01/18/2016  Admit 07/15/2019 from urgent care with hypoxia-on exam found to have erythematous indurated left upper buttock gluteal cleft cellulitis Started on clindamycin and pain control Developed some low blood pressures  Ultimately was found to have an abscess and general surgery performed an I&D on this She currently has moderate pain and we are waiting on cultures to disposition her properly   Assessment & Plan:   Active Problems:   Hypothyroidism   Type 2 diabetes mellitus with other specified complication (HCC)   Essential hypertension   COPD (HCC)   Polymyalgia rheumatica (HCC)   CKD (chronic kidney disease) stage 3, GFR 30-59 ml/min   Cellulitis of buttock, left   Acute on chronic respiratory failure with hypoxia ([place])   1. [place] cleft cellulitis + Asbcess status post incision and drainage Dr. Mercy Riding 07/20/2019 a. CT scan 6/16 = abscess--appreciate Dr. Ninfa Linden input and surgery 6/17 b. wound pictures below show some exudate on floor of wound--appreciate attention to wound by Dr. Ophelia Shoulder further surgery seems indicated c. Follow wound/deep cultures and narrow appropriately d. WBC is elevated most likely 2/2 chronic steroids e. With Penrose drain debility and advanced age suspect might require SNF--greatly appreciate therapy input--stair training  6/21 f. Pain mod controleld g. note that sitz bath have been ordered per general surgery and RN is aware of the same h. await hydrotherapy per Gen Surgery input 2. COPD stage III-IV-- oxygen 2 L and will need to be on this on discharge  a. Outpatient PFts per PCP b. Continue meds albuterol 2.5 Neb 3. Acute blood loss anemia a. Secondary to surgery and volume resuscitation b. labs are stable 4. AKI superimposed on admission on CKD 3B  Mild metabolic acidosis   hypomagnesemia, and hyperkalemia a. Holding diuretic antihypertensives at this time b. Continue Coreg 25 twice daily (unusually high dose) c. D/c Mag-Ox  400 twice daily 5. Polymyalgia rheumatica a. Continuing prednisone 17.5 b. ?  Lower dose metformin in the outpatient setting as below 6. DM TY 2 a. ? D/c Metformin eventually b. Currently on sliding scale coverage sugars are well controlled c. Goal A1c probably above 8.0 given advanced age etc. 92. HFpEF with low risk stress test 2016 a. Continue goal-directed therapy Coreg 25 twice daily continue amlodipine 5-Hygroton/lisinopril have been held  8. Prior TIA a. On aspirin 81 b. Will need lipid panel 3 to 6 months also continue Lipitor 40 9. Prior lap chole 10. Hypothyroid a. Continue levothyroxine 88 mcg b. Consider TSH in 3 to 4 weeks   DVT prophylaxis: lovenox Code Status: Full Called daughter on phone 6/19 at [phone]---no Disposition:   Status is: Inpatient Remains inpatient appropriate because:Ongoing diagnostic testing needed not appropriate for outpatient work up and IV treatments appropriate due to intensity of illness or inability to take PO Dispo: The patient is from: Home  Anticipated d/c is to: SNF              Anticipated d/c date is: 3 days              Patient currently is not medically stable to d/c.   Consultants:   None yet  Procedures: [place] x 2  Antimicrobials: Ceftriaxone 2 g since 6/15 Zosyn started on  6/17.  Subjective:  Well  pain is controlled to some degree Wounds as below Tender slightly in R buttocks cheeck  Objective: Vitals:   07/21/19 2055 07/22/19 0500 07/22/19 0514 07/22/19 1359  BP: (!) 148/59 137/70 133/77 114/64  Pulse: (!) 55 61 68 60  Resp: 18 18 18 18   Temp: 97.6 F (36.4 C) 98.2 F (36.8 C) 97.8 F (36.6 C) 97.9 F (36.6 C)  TempSrc: Oral Oral Oral Oral  SpO2: 98% 98% 98% 99%  Weight:      Height:        Intake/Output Summary (Last 24 hours) at 07/22/2019 1713 Last data filed at 07/22/2019 1400 Gross per 24 hour  Intake 2028 ml  Output 1550 ml  Net 478 ml   Filed Weights   07/15/19 1626  Weight: 93.4 kg    Examination: Awake alert in nad, on oxygen Ct ab no added sound abd soft-Dressing Removed        Neuro intact Coherent   Data Reviewed: I have personally reviewed following labs and imaging studies Potassium up from 4.8-5.8-->4.8 CO2 down from 21-18--->21 BUN/creatinine 37/1.4 eGFR = 39 White count holding at 21 Hemoglobin 11.2-->8.7 -->9.6  Radiology Studies: No results found.    Scheduled Meds: . aspirin EC  81 mg Oral Daily  . atorvastatin  40 mg Oral q1800  . carvedilol  25 mg Oral BID WC  . chlorhexidine  15 mL Mouth/Throat Once  . enoxaparin (LOVENOX) injection  40 mg Subcutaneous Q24H  . guaiFENesin  600 mg Oral BID  . ibuprofen  600 mg Oral TID  . insulin aspart  0-5 Units Subcutaneous QHS  . insulin aspart  0-9 Units Subcutaneous TID WC  . latanoprost  1 drop Both Eyes QHS  . levothyroxine  88 mcg Oral Q0600  . magnesium oxide  400 mg Oral BID  . PARoxetine  30 mg Oral Daily  . predniSONE  17.5 mg Oral Q breakfast   Continuous Infusions: . lactated ringers 50 mL/hr at 07/22/19 0200  . piperacillin-tazobactam (ZOSYN)  IV 3.375 g (07/22/19 1338)    LOS: 7 days   Time spent: [place], MD [facility]   To contact the attending provider between 7A-7P or the covering provider  during after hours 7P-7A, please log into the web site [email or web address] and access using universal [place] password for that web site. If you do not have the password, please call the hospital operator.  07/22/2019, 5:13 PM

## 2019-07-22 NOTE — Plan of Care (Signed)
  Problem: Activity: Goal: Risk for activity intolerance will decrease Outcome: Progressing   Problem: Skin Integrity: Goal: Risk for impaired skin integrity will decrease Outcome: Progressing   

## 2019-07-22 NOTE — Progress Notes (Signed)
[facility] Progress Note  2 Days Post-Op  Subjective: Wound re-evaluated today. Stable discomfort where she has penrose drain.   Objective: Vital signs in last 24 hours: Temp:  [97.6 F (36.4 C)-98.2 F (36.8 C)] 97.9 F (36.6 C) (06/19 1359) Pulse Rate:  [55-68] 60 (06/19 1359) Resp:  [16-18] 18 (06/19 1359) BP: (100-148)/(59-77) 114/64 (06/19 1359) SpO2:  [96 %-99 %] 99 % (06/19 1359) Last BM Date: 07/22/19  Intake/Output from previous day: 06/18 0701 - 06/19 0700 In: 2974.2 [P.O.:1320; I.V.:1554.1; IV Piggyback:100] Out: 950 [Urine:950] Intake/Output this shift: Total I/O In: 530 [P.O.:480; IV Piggyback:50] Out: 600 [Urine:600]  PE: GU: minimal cellulitis of bilateral buttocks, L buttock wound with some bloody-purulent drainage present, penrose drain in R buttock   Lab Results:  Recent Labs    07/21/19 0510 07/22/19 0537  WBC 19.0* 21.1*  HGB 8.7* 9.6*  HCT 25.4* 29.6*  PLT 239 285   BMET Recent Labs    07/21/19 0510 07/22/19 0537  NA 130* 134*  K 4.8 4.8  CL 101 100  CO2 23 26  GLUCOSE 109* 121*  BUN 33* 34*  CREATININE 1.64* 1.42*  CALCIUM 9.2 9.8   PT/INR No results for input(s): LABPROT, INR in the last 72 hours. CMP     Component Value Date/Time   NA 134 (L) 07/22/2019 0537   NA 138 05/08/2019 1058   K 4.8 07/22/2019 0537   CL 100 07/22/2019 0537   CO2 26 07/22/2019 0537   GLUCOSE 121 (H) 07/22/2019 0537   BUN 34 (H) 07/22/2019 0537   BUN 22 05/08/2019 1058   CREATININE 1.42 (H) 07/22/2019 0537   CREATININE 0.72 08/14/2014 1554   CALCIUM 9.8 07/22/2019 0537   PROT 6.5 07/22/2019 0537   ALBUMIN 2.6 (L) 07/22/2019 0537   AST 22 07/22/2019 0537   ALT 19 07/22/2019 0537   ALKPHOS 89 07/22/2019 0537   BILITOT 0.8 07/22/2019 0537   GFRNONAA 34 (L) 07/22/2019 0537   GFRNONAA 81 08/14/2014 1554   GFRAA 40 (L) 07/22/2019 0537   GFRAA >89 08/14/2014 1554   Lipase     Component Value Date/Time   LIPASE 11 01/17/2016 0624        Studies/Results: No results found.  Anti-infectives: Anti-infectives (From admission, onward)   Start     Dose/Rate Route Frequency Ordered Stop   07/20/19 0800  piperacillin-tazobactam (ZOSYN) IVPB 3.375 g     Discontinue     3.375 g 12.5 mL/hr over 240 Minutes Intravenous Every 8 hours 07/20/19 0750     07/20/19 0745  piperacillin-tazobactam (ZOSYN) IVPB 3.375 g  Status:  Discontinued        3.375 g 100 mL/hr over 30 Minutes Intravenous Every 8 hours 07/20/19 0744 07/20/19 0750   07/18/19 1200  cefTRIAXone (ROCEPHIN) 2 g in sodium chloride 0.9 % 100 mL IVPB  Status:  Discontinued        2 g 200 mL/hr over 30 Minutes Intravenous Every 24 hours 07/18/19 1100 07/20/19 0744   07/15/19 2200  clindamycin (CLEOCIN) IVPB 600 mg  Status:  Discontinued        600 mg 100 mL/hr over 30 Minutes Intravenous Every 8 hours 07/15/19 1945 07/18/19 1101   07/15/19 1530  vancomycin (VANCOCIN) IVPB 1000 mg/200 mL premix        1,000 mg 200 mL/hr over 60 Minutes Intravenous  Once 07/15/19 1525 07/15/19 1755   07/15/19 1530  cefTRIAXone (ROCEPHIN) 2 g in sodium chloride 0.9 % 100  mL IVPB        2 g 200 mL/hr over 30 Minutes Intravenous  Once 07/15/19 1525 07/15/19 1647       Assessment/Plan T2DM - hold metformin x48h since contrast CT, SSI Hx of CVA Hx of MI HTN Hypothyroidism COPD CHF GERD  Perianal abscess tracking into L buttock S/p I&D 6/17 Dr. Ninfa Linden - POD#2  - Needs hydrotherapy/better Sitz baths; no evidence of uncontrolled source/fluctuance  - continue dressing changes BID or prn for soiling  FEN: NPO, IVF VTE: lovenox ID: [email or web address] 6/15>6/17; Zosyn 6/17>>  LOS: 7 days   Nadeen Landau, M.D. [facility] Use [email or web address] to contact on call provider

## 2019-07-22 NOTE — Progress Notes (Signed)
PT Cancellation Note  Patient Details Name: Caitlyn Aguirre MRN: [id number] DOB: Feb 18, 1935   Cancelled Treatment:    Reason Eval/Treat Not Completed: Fatigue/lethargy limiting ability to participate;Other (comment) (attempted PT x 2. First attempt pt was being assisted with cleaning up, second attempt pt stated she was too fatigued to do any PT but would like to try tomorrow. Will follow.)  Philomena Doheny PT 07/22/2019  Acute Rehabilitation Services Pager [phone] Office [phone]

## 2019-07-23 LAB — COMPREHENSIVE METABOLIC PANEL
ALT: 21 U/L (ref 0–44)
AST: 24 U/L (ref 15–41)
Albumin: 2.4 g/dL — ABNORMAL LOW (ref 3.5–5.0)
Alkaline Phosphatase: 74 U/L (ref 38–126)
Anion gap: 4 — ABNORMAL LOW (ref 5–15)
BUN: 29 mg/dL — ABNORMAL HIGH (ref 8–23)
CO2: 26 mmol/L (ref 22–32)
Calcium: 9.3 mg/dL (ref 8.9–10.3)
Chloride: 102 mmol/L (ref 98–111)
Creatinine, Ser: 1.39 mg/dL — ABNORMAL HIGH (ref 0.44–1.00)
GFR calc Af Amer: 41 mL/min — ABNORMAL LOW (ref >60)
GFR calc non Af Amer: 35 mL/min — ABNORMAL LOW (ref >60)
Glucose, Bld: 144 mg/dL — ABNORMAL HIGH (ref 70–99)
Potassium: 4.7 mmol/L (ref 3.5–5.1)
Sodium: 132 mmol/L — ABNORMAL LOW (ref 135–145)
Total Bilirubin: 0.5 mg/dL (ref 0.3–1.2)
Total Protein: 5.9 g/dL — ABNORMAL LOW (ref 6.5–8.1)

## 2019-07-23 LAB — CBC WITH DIFFERENTIAL/PLATELET
Abs Immature Granulocytes: 1.46 10*3/uL — ABNORMAL HIGH (ref 0.00–0.07)
Basophils Absolute: 0.1 10*3/uL (ref 0.0–0.1)
Basophils Relative: 0 %
Eosinophils Absolute: 0.1 10*3/uL (ref 0.0–0.5)
Eosinophils Relative: 0 %
HCT: 27.2 % — ABNORMAL LOW (ref 36.0–46.0)
Hemoglobin: 9 g/dL — ABNORMAL LOW (ref 12.0–15.0)
Immature Granulocytes: 8 %
Lymphocytes Relative: 8 %
Lymphs Abs: 1.4 10*3/uL (ref 0.7–4.0)
MCH: 28.2 pg (ref 26.0–34.0)
MCHC: 33.1 g/dL (ref 30.0–36.0)
MCV: 85.3 fL (ref 80.0–100.0)
Monocytes Absolute: 1.2 10*3/uL — ABNORMAL HIGH (ref 0.1–1.0)
Monocytes Relative: 7 %
Neutro Abs: 13.7 10*3/uL — ABNORMAL HIGH (ref 1.7–7.7)
Neutrophils Relative %: 77 %
Platelets: 284 10*3/uL (ref 150–400)
RBC: 3.19 MIL/uL — ABNORMAL LOW (ref 3.87–5.11)
RDW: 15.6 % — ABNORMAL HIGH (ref 11.5–15.5)
WBC: 17.9 10*3/uL — ABNORMAL HIGH (ref 4.0–10.5)
nRBC: 0.1 % (ref 0.0–0.2)

## 2019-07-23 LAB — GLUCOSE, CAPILLARY
Glucose-Capillary: 142 mg/dL — ABNORMAL HIGH (ref 70–99)
Glucose-Capillary: 207 mg/dL — ABNORMAL HIGH (ref 70–99)
Glucose-Capillary: 305 mg/dL — ABNORMAL HIGH (ref 70–99)
Glucose-Capillary: 226 mg/dL — ABNORMAL HIGH (ref 70–99)

## 2019-07-23 MED ORDER — HYDROMORPHONE HCL 1 MG/ML IJ SOLN
1.0000 mg | INTRAMUSCULAR | Status: DC | PRN
  Administered 2019-07-23: 1 mg via INTRAVENOUS
  Filled 2019-07-23: qty 1

## 2019-07-23 NOTE — Plan of Care (Signed)

## 2019-07-23 NOTE — Progress Notes (Signed)
    PT HYDROTHERAPY EVALUATION  Will plan to follow, for now, for a few days. Wound actually looks really good- 95% red tissue. Pt doesn't really need PT hydrotherapy but MD is requesting. Likely only needs daily rinsing with saline/Sitz bath and some gentle wiping with gauze during dressing changes. Pt did not tolerate procedure well-tearful entire session. Will definintely need to be pre-medicated before hydrotherapy and she may also need pain meds after hydrotherapy.     07/23/19 1300  Subjective Assessment  Subjective  ("it hurts. help me Caitlyn Aguirre")  Date of Onset 07/15/19  Prior Treatments s/p I&D 07/20/19  Evaluation and Treatment  Evaluation and Treatment Procedures Explained to Patient/Family Yes  Evaluation and Treatment Procedures agreed to  Wound / Incision (Open or Dehisced) 07/15/19 Incision - Open Buttocks Left L buttock abscess s/p I &D 07/20/19  Date First Assessed: 07/15/19   Wound Type: Incision - Open  Location: Buttocks  Location Orientation: Left  Wound Description (Comments): L buttock abscess s/p I &D 07/20/19  Dressing Type ABD;Gauze (Comment);Moist to moist  Dressing Changed Changed  Dressing Status Old drainage  Dressing Change Frequency PRN (2* bowel incontinence)  Site / Wound Assessment Pink;Bleeding;Red;Granulation tissue;Yellow  % Wound base Red or Granulating 95%  % Wound base Yellow/Fibrinous Exudate 5%  Peri-wound Assessment Maceration;Pink;Erythema (non-blanchable)  Wound Length (cm) 7.5 cm  Wound Width (cm) 4 cm  Wound Depth (cm) 3 cm  Wound Volume (cm^3) 90 cm^3  Wound Surface Area (cm^2) 30 cm^2  Tunneling (cm) 2 (2:00)  Undermining (cm)  (left side of wound)  Margins Unattached edges (unapproximated)  Drainage Amount Minimal  Drainage Description Serosanguineous  Treatment Debridement (Selective);Hydrotherapy (Pulse lavage);Packing (Saline gauze)  Hydrotherapy  Pulsed lavage therapy - wound location L buttock  Pulsed Lavage with Suction (psi)  8 psi  Pulsed Lavage with Suction - Normal Saline Used 1000 mL  Pulsed Lavage Tip Tip with splash shield  Selective Debridement  Selective Debridement - Location L buttocks  Selective Debridement - Tools Used Forceps;Scissors  Selective Debridement - Tissue Removed slough  Wound Therapy - Assess/Plan/Recommendations  Wound Therapy - Clinical Statement 84 yo female admitted with L buttock cellulitis/abscess. S/P I&D 07/20/19  Factors Delaying/Impairing Wound Healing Immobility;Incontinence  Hydrotherapy Plan Dressing change;Debridement;Patient/family education;Pulsatile lavage with suction  Wound Therapy - Frequency 6X / week  Wound Therapy - Current Recommendations Case manager/social work  Wound Therapy - Follow Up Recommendations Home health RN;Skilled nursing facility  Wound Plan Will plan to follow, for now, for a few days. Wound actually looks really good- 95% red tissue. Pt doesn't really need PT hydrotherapy but MD is requesting. Likely only needs daily rinsing with saline/Sitz bath and some gentle wiping with gauze during dressing changes. Pt did not tolerate procedure well-tearful entire session. Will definintely need to be pre-medicated before hydrotherapy and she may also need pain meds after hydrotherapy.   Wound Therapy Goals - Improve the function of patient's integumentary system by progressing the wound(s) through the phases of wound healing by:  Decrease Necrotic Tissue to 2%  Decrease Necrotic Tissue - Progress Goal set today  Increase Granulation Tissue to 98%  Increase Granulation Tissue - Progress Goal set today  Goals/treatment plan/discharge plan were made with and agreed upon by patient/family Yes  Time For Goal Achievement 7 days  Wound Therapy - Potential for Goals Excellent   Caitlyn Aguirre, PT Acute Rehabilitation  Office: [phone] Pager: [phone]

## 2019-07-23 NOTE — Progress Notes (Signed)
Physical Therapy Treatment Patient Details Name: LAQUITHA HESLIN MRN: [id number] DOB: 1935-04-08 Today's Date: 07/23/2019    History of Present Illness Patient Admit 07/15/2019 from urgent care with hypoxia-on exam found to have erythematous indurated left upper buttock gluteal cleft cellulitis. Patient started on clindamycin and pain control. She is now s/p I&D of preianal Lt buttock abscess on 07/30/19. PMH significant for DM, OA, MI, HTN, GERD, COPD, CHF.    PT Comments    Bowel incontinence occurred at start of session. Assisted pt onto bsc then back to bed for hydrotherapy session.    Follow Up Recommendations  SNF;Home health PT;Supervision/Assistance - 24 hour (depending on assist available and family decision)     Equipment Recommendations  Rolling walker with 5" wheels    Recommendations for Other Services       Precautions / Restrictions Precautions Precautions: Fall Precaution Comments: penrose drain in Rt buttock    Mobility  Bed Mobility Overal bed mobility: Needs Assistance Bed Mobility: Rolling;Sit to Sidelying Rolling: Supervision       Sit to sidelying: Min guard General bed mobility comments: Increased time.  Transfers Overall transfer level: Needs assistance Equipment used: Rolling walker (2 wheeled) Transfers: Sit to/from Stand Sit to Stand: Min guard         General transfer comment: Min guard for safety. Cues for safety, hand placement. Bowel incontince noted. Assisted pt onto bsc.  Ambulation/Gait Ambulation/Gait assistance: Min guard Gait Distance (Feet): 5 Feet Assistive device: Rolling walker (2 wheeled) Gait Pattern/deviations: Step-through pattern;Decreased stride length     General Gait Details: Pt took a few steps from bsc to bed with RW. Remained on [place] O2. Assisted back to bed after toileting for hydrotherapy procedure   Stairs             Wheelchair Mobility    Modified Rankin (Stroke Patients Only)       Balance  Overall balance assessment: Needs assistance         Standing balance support: Bilateral upper extremity supported Standing balance-Leahy Scale: Poor                              Cognition Arousal/Alertness: Awake/alert Behavior During Therapy: WFL for tasks assessed/performed Overall Cognitive Status: No family/caregiver present to determine baseline cognitive functioning                                 General Comments: patient alert, does appear to require increase time to respond to questions and initiate tasks.      Exercises      General Comments        Pertinent Vitals/Pain Pain Assessment: Faces Faces Pain Scale: Hurts even more Pain Location: buttock, esp with peri care Pain Descriptors / Indicators: Discomfort;Grimacing;Guarding Pain Intervention(s): Limited activity within patient's tolerance;Repositioned    Home Living                      Prior Function            PT Goals (current goals can now be found in the care plan section) Progress towards PT goals: Progressing toward goals    Frequency    Min 3X/week      PT Plan Current plan remains appropriate    Co-evaluation              AM-PAC PT "6  Clicks" Mobility   Outcome Measure  Help needed turning from your back to your side while in a flat bed without using bedrails?: A Little Help needed moving from lying on your back to sitting on the side of a flat bed without using bedrails?: A Little Help needed moving to and from a bed to a chair (including a wheelchair)?: A Little Help needed standing up from a chair using your arms (e.g., wheelchair or bedside chair)?: A Little Help needed to walk in hospital room?: A Little Help needed climbing 3-5 steps with a railing? : A Lot 6 Click Score: 17    End of Session   Activity Tolerance: Patient limited by pain Patient left: in bed;with call bell/phone within reach   PT Visit Diagnosis: Muscle weakness  (generalized) (M62.81);Difficulty in walking, not elsewhere classified (R26.2);Pain Pain - part of body:  (buttocks)     Time: 1115-1130 PT Time Calculation (min) (ACUTE ONLY): 15 min  Charges:  $Gait Training: 8-22 mins              Doreatha Massed, PT Acute Rehabilitation  Office: [phone] Pager: [phone]

## 2019-07-23 NOTE — Progress Notes (Addendum)
PT Cancellation Note  Patient Details Name: Caitlyn Aguirre MRN: [id number] DOB: 22-Jun-1935   Cancelled Treatment:    Reason Eval/Treat Not Completed: Order received. Chart reviewed. Will request nursing continue dressing changes on today and PT will begin hydrotherapy on Monday. Thanks.  Addendum: Secure chat with Dr Dema Severin on today-he prefers we begin hydro today. Will perform PT hydro/tx today.     [place] [facility]  Office: [phone] Pager: [phone]

## 2019-07-23 NOTE — Progress Notes (Signed)
PROGRESS NOTE  Caitlyn Aguirre  VQQ:[id number] DOB: March 07, 1935 DOA: 07/15/2019  PCP: Axel Filler, MD  TEACHING SERVICE PATIENT--- we will CC PCP for outpatient follow-up  Chief Complaint  Patient presents with  . Fever  . Shortness of Breath  . Abscess   Brief Narrative:  84 year old black female Prior tobacco quit in [place] stage II-III COPD on home oxygen 2 L (07/18/2011 FEV/FVC 78% FEV1 86%)  DM TY 2, Probable polymyalgia rheumatica HFpEF echocardiogram 07/11/2018 55-60% EF, PASP 38 mm  HTN, hypothyroidism CAD status post DES-low risk stress test 10/31/2014 TIA Status post lap chole 01/18/2016  Admit 07/15/2019 from urgent care with hypoxia-on exam found to have erythematous indurated left upper buttock gluteal cleft cellulitis Started on clindamycin and pain control Developed some low blood pressures  Ultimately was found to have an abscess and general surgery performed an I&D on this She currently has moderate pain and we are waiting on cultures to disposition her properly   Assessment & Plan:   Active Problems:   Hypothyroidism   Type 2 diabetes mellitus with other specified complication (HCC)   Essential hypertension   COPD (HCC)   Polymyalgia rheumatica (HCC)   CKD (chronic kidney disease) stage 3, GFR 30-59 ml/min   Cellulitis of buttock, left   Acute on chronic respiratory failure with hypoxia ([place])   1. Natal cleft cellulitis + Asbcess status post incision and drainage Dr. Mercy Riding 07/20/2019 a. CT scan 6/16 = abscess--appreciate Dr. Ninfa Linden input and surgery 6/17 b. wound not examined today-appreciate review by Dr. Dema Severin 6/19 no further surgery needed c. Follow wound/deep cultures and narrow appropriately d. WBC is elevated most likely 2/2 chronic steroids e. With Penrose drain debility and advanced age suspect might require SNF--greatly appreciate therapy input--stair training 6/21 f. Pain mod controleld g. n continue sits baths as  per surgeon h. Had Hyrdotherapy--defer to collective reasoning of both PT and general surgery on review of wounds again 6/21 i. Pain controlled increased with Dilaudid IV every 2 as needed for 3 doses- i. first choice ibuprofen 600 3 times daily  ii. second choice oxycodone 10 every 4 as needed  iii. we will de-escalate as as needed over the next several days depending on if she requires further hydrotherapy 2. COPD stage III-IV-- oxygen 2 L and will need to be on this on discharge  a. Outpatient PFts per PCP b. Continue meds albuterol 2.5 Neb 3. Acute blood loss anemia a. 2/2 surgery/volume b. Hemoglobin stable currently 4. AKI superimposed on admission on CKD 3B  Mild metabolic acidosis   hypomagnesemia, and hyperkalemia a. Holding diuretic antihypertensives at this time b. Continue Coreg 25 twice daily (unusually high dose) c. Repeat labs a.m. 5. Polymyalgia rheumatica a. Continuing prednisone 17.5 b. ?  Lower dose metformin in the outpatient setting as below 6. DM TY 2 a. ? D/c Metformin eventually b. Currently on sliding scale coverage sugars 144--207 c. Goal A1c probably above 8.0 given advanced age etc. 29. HFpEF with low risk stress test 2016 a. Continue goal-directed therapy Coreg 25 twice daily continue amlodipine 5-Hygroton/lisinopril have been held  8. Prior TIA a. On aspirin 81 b. Will need lipid panel 3 to 6 months also continue Lipitor 40 9. Prior lap chole 10. Hypothyroid a. Continue levothyroxine 88 mcg b. Consider TSH in 3 to 4 weeks   DVT prophylaxis: lovenox Code Status: Full Called daughter on phone 6/20 at [phone]. Disposition:   Status is: Inpatient Remains inpatient appropriate  because:Ongoing diagnostic testing needed not appropriate for outpatient work up and IV treatments appropriate due to intensity of illness or inability to take PO Dispo: The patient is from: Home              Anticipated d/c is to: SNF              Anticipated d/c date is: 3  days              Patient currently is not medically stable to d/c.   Consultants:   None yet  Procedures: [place] x 2  Antimicrobials: Ceftriaxone 2 g since 6/15 Zosyn started on 6/17 and continuing  Subjective:  Tearful and in severe pain after hydrotherapy Did not eat much today On review later during the day daughter tells me she has been walking the hallway and feels great  Objective: Vitals:   07/22/19 1359 07/22/19 2148 07/23/19 0621 07/23/19 1428  BP: 114/64 128/66 121/61 129/62  Pulse: 60 (!) 56 (!) 56 63  Resp: 18 15 16 18   Temp: 97.9 F (36.6 C) 98.4 F (36.9 C) 98 F (36.7 C) 97.7 F (36.5 C)  TempSrc: Oral Oral Oral Oral  SpO2: 99% 98% 98% 98%  Weight:      Height:        Intake/Output Summary (Last 24 hours) at 07/23/2019 1510 Last data filed at 07/23/2019 0900 Gross per 24 hour  Intake 1735.31 ml  Output 400 ml  Net 1335.31 ml   Filed Weights   07/15/19 1626  Weight: 93.4 kg    Examination: Awake alert in nad, on oxygen Ct ab no added sound abd soft--I did not disturb dressings today as she was just dressed Neuro intact Coherent   Data Reviewed: I have personally reviewed following labs and imaging studies Potassium up from 4.8-5.8-->4.8-->4.8 CO2 down from 21-18--->21-->26 BUN/creatinine 37/1.4-->29/1.39 White count holding at 21-->17.9 Hemoglobin 11.2-->8.7 -->9.6-->9.0  Radiology Studies: No results found.    Scheduled Meds: . aspirin EC  81 mg Oral Daily  . atorvastatin  40 mg Oral q1800  . carvedilol  25 mg Oral BID WC  . chlorhexidine  15 mL Mouth/Throat Once  . enoxaparin (LOVENOX) injection  40 mg Subcutaneous Q24H  . guaiFENesin  600 mg Oral BID  . ibuprofen  600 mg Oral TID  . insulin aspart  0-5 Units Subcutaneous QHS  . insulin aspart  0-9 Units Subcutaneous TID WC  . latanoprost  1 drop Both Eyes QHS  . levothyroxine  88 mcg Oral Q0600  . PARoxetine  30 mg Oral Daily  . predniSONE  17.5 mg Oral Q breakfast    Continuous Infusions: . lactated ringers 30 mL/hr at 07/23/19 0200  . piperacillin-tazobactam (ZOSYN)  IV 3.375 g (07/23/19 0656)    LOS: 8 days   Time spent: 25  Nita Sells, MD [facility]   To contact the attending provider between 7A-7P or the covering provider during after hours 7P-7A, please log into the web site [email or web address] and access using universal [place] password for that web site. If you do not have the password, please call the hospital operator.  07/23/2019, 3:10 PM

## 2019-07-23 NOTE — Progress Notes (Signed)
Patient has had dressing changes (on buttocks) 4 times total during the night shift. There were bowel movements all 4 times.

## 2019-07-24 DIAGNOSIS — L03317 Cellulitis of buttock: Secondary | ICD-10-CM

## 2019-07-24 LAB — COMPREHENSIVE METABOLIC PANEL
ALT: 21 U/L (ref 0–44)
AST: 21 U/L (ref 15–41)
Albumin: 2.4 g/dL — ABNORMAL LOW (ref 3.5–5.0)
Alkaline Phosphatase: 65 U/L (ref 38–126)
Anion gap: 7 (ref 5–15)
BUN: 27 mg/dL — ABNORMAL HIGH (ref 8–23)
CO2: 26 mmol/L (ref 22–32)
Calcium: 9.6 mg/dL (ref 8.9–10.3)
Chloride: 104 mmol/L (ref 98–111)
Creatinine, Ser: 1.48 mg/dL — ABNORMAL HIGH (ref 0.44–1.00)
GFR calc Af Amer: 38 mL/min — ABNORMAL LOW (ref >60)
GFR calc non Af Amer: 33 mL/min — ABNORMAL LOW (ref >60)
Glucose, Bld: 125 mg/dL — ABNORMAL HIGH (ref 70–99)
Potassium: 4.8 mmol/L (ref 3.5–5.1)
Sodium: 137 mmol/L (ref 135–145)
Total Bilirubin: 0.6 mg/dL (ref 0.3–1.2)
Total Protein: 5.8 g/dL — ABNORMAL LOW (ref 6.5–8.1)

## 2019-07-24 LAB — CBC WITH DIFFERENTIAL/PLATELET
Abs Immature Granulocytes: 1.8 10*3/uL — ABNORMAL HIGH (ref 0.00–0.07)
Basophils Absolute: 0 10*3/uL (ref 0.0–0.1)
Basophils Relative: 0 %
Eosinophils Absolute: 0.1 10*3/uL (ref 0.0–0.5)
Eosinophils Relative: 1 %
HCT: 27.2 % — ABNORMAL LOW (ref 36.0–46.0)
Hemoglobin: 9.1 g/dL — ABNORMAL LOW (ref 12.0–15.0)
Lymphocytes Relative: 8 %
Lymphs Abs: 1.2 10*3/uL (ref 0.7–4.0)
MCH: 28.3 pg (ref 26.0–34.0)
MCHC: 33.5 g/dL (ref 30.0–36.0)
MCV: 84.7 fL (ref 80.0–100.0)
Metamyelocytes Relative: 3 %
Monocytes Absolute: 0.9 10*3/uL (ref 0.1–1.0)
Monocytes Relative: 6 %
Myelocytes: 8 %
Neutro Abs: 10.9 10*3/uL — ABNORMAL HIGH (ref 1.7–7.7)
Neutrophils Relative %: 73 %
Platelets: 319 10*3/uL (ref 150–400)
Promyelocytes Relative: 1 %
RBC: 3.21 MIL/uL — ABNORMAL LOW (ref 3.87–5.11)
RDW: 16 % — ABNORMAL HIGH (ref 11.5–15.5)
WBC: 14.9 10*3/uL — ABNORMAL HIGH (ref 4.0–10.5)
nRBC: 0.4 % — ABNORMAL HIGH (ref 0.0–0.2)

## 2019-07-24 LAB — GLUCOSE, CAPILLARY
Glucose-Capillary: 121 mg/dL — ABNORMAL HIGH (ref 70–99)
Glucose-Capillary: 173 mg/dL — ABNORMAL HIGH (ref 70–99)

## 2019-07-24 MED ORDER — ACETAMINOPHEN 325 MG PO TABS: 650.0000 mg | ORAL_TABLET | Freq: Four times a day (QID) | ORAL | Status: DC | PRN

## 2019-07-24 MED ORDER — AMOXICILLIN-POT CLAVULANATE 875-125 MG PO TABS
1.0000 | ORAL_TABLET | Freq: Two times a day (BID) | ORAL | 0 refills | Status: DC
Start: 2019-07-24 — End: 2019-07-24

## 2019-07-24 MED ORDER — IBUPROFEN 600 MG PO TABS: 600.0000 mg | ORAL_TABLET | Freq: Three times a day (TID) | ORAL | 0 refills | Status: DC

## 2019-07-24 MED ORDER — CIPROFLOXACIN HCL 500 MG PO TABS
500.0000 mg | ORAL_TABLET | Freq: Two times a day (BID) | ORAL | 0 refills | Status: DC
Start: 2019-07-24 — End: 2019-07-31

## 2019-07-24 MED ORDER — OXYCODONE HCL 10 MG PO TABS: 10.0000 mg | ORAL_TABLET | ORAL | 0 refills | Status: DC | PRN

## 2019-07-24 MED ORDER — METRONIDAZOLE 500 MG PO TABS
500.0000 mg | ORAL_TABLET | Freq: Three times a day (TID) | ORAL | 0 refills | Status: DC
Start: 2019-07-24 — End: 2019-07-31

## 2019-07-24 MED ORDER — INSULIN ASPART 100 UNIT/ML ~~LOC~~ SOLN: 0.0000 [IU] | Freq: Three times a day (TID) | SUBCUTANEOUS | 11 refills | Status: DC

## 2019-07-24 NOTE — Consult Note (Signed)
   [facility] Inpatient Consult   07/24/2019  Caitlyn Aguirre 12/22/1935 [id number]   Patient chart reviewed for [place] Management for community chronic disease management needs. Patient is currently active with RN embedded care manager at PCP, [facility]. Will notify community care manager of patient disposition for post hospital follow up.  Of note, [facility] Care Management services does not replace or interfere with any services that are arranged by inpatient case management or social work.  [name], MSN, [place] [facility] Liaison Nurse Mobile Phone [phone]  Toll free office [phone]

## 2019-07-24 NOTE — Progress Notes (Signed)
[facility] Progress Note  4 Days Post-Op  Subjective: Pain around wound, especially with dressing changes. Discussed wound care and showers with patient and daughter at bedside. Being discharged today.   Objective: Vital signs in last 24 hours: Temp:  [97.7 F (36.5 C)-98.4 F (36.9 C)] 98.1 F (36.7 C) (06/21 0837) Pulse Rate:  [51-63] 51 (06/21 0837) Resp:  [15-18] 18 (06/21 0837) BP: (115-139)/(48-84) 115/48 (06/21 0837) SpO2:  [96 %-100 %] 99 % (06/21 0837) Last BM Date: 07/23/19  Intake/Output from previous day: 06/20 0701 - 06/21 0700 In: 1430.8 [P.O.:960; I.V.:338.1; IV Piggyback:132.7] Out: 1150 [Urine:1150] Intake/Output this shift: No intake/output data recorded.  PE: GU: cellulitis of bilateral buttocks improved, penrose in place   Lab Results:  Recent Labs    07/23/19 0525 07/24/19 0457  WBC 17.9* 14.9*  HGB 9.0* 9.1*  HCT 27.2* 27.2*  PLT 284 319   BMET Recent Labs    07/23/19 0525 07/24/19 0457  NA 132* 137  K 4.7 4.8  CL 102 104  CO2 26 26  GLUCOSE 144* 125*  BUN 29* 27*  CREATININE 1.39* 1.48*  CALCIUM 9.3 9.6   PT/INR No results for input(s): LABPROT, INR in the last 72 hours. CMP     Component Value Date/Time   NA 137 07/24/2019 0457   NA 138 05/08/2019 1058   K 4.8 07/24/2019 0457   CL 104 07/24/2019 0457   CO2 26 07/24/2019 0457   GLUCOSE 125 (H) 07/24/2019 0457   BUN 27 (H) 07/24/2019 0457   BUN 22 05/08/2019 1058   CREATININE 1.48 (H) 07/24/2019 0457   CREATININE 0.72 08/14/2014 1554   CALCIUM 9.6 07/24/2019 0457   PROT 5.8 (L) 07/24/2019 0457   ALBUMIN 2.4 (L) 07/24/2019 0457   AST 21 07/24/2019 0457   ALT 21 07/24/2019 0457   ALKPHOS 65 07/24/2019 0457   BILITOT 0.6 07/24/2019 0457   GFRNONAA 33 (L) 07/24/2019 0457   GFRNONAA 81 08/14/2014 1554   GFRAA 38 (L) 07/24/2019 0457   GFRAA >89 08/14/2014 1554   Lipase     Component Value Date/Time   LIPASE 11 01/17/2016 0624        Studies/Results: No results found.  Anti-infectives: Anti-infectives (From admission, onward)   Start     Dose/Rate Route Frequency Ordered Stop   07/20/19 0800  piperacillin-tazobactam (ZOSYN) IVPB 3.375 g     Discontinue     3.375 g 12.5 mL/hr over 240 Minutes Intravenous Every 8 hours 07/20/19 0750     07/20/19 0745  piperacillin-tazobactam (ZOSYN) IVPB 3.375 g  Status:  Discontinued        3.375 g 100 mL/hr over 30 Minutes Intravenous Every 8 hours 07/20/19 0744 07/20/19 0750   07/18/19 1200  cefTRIAXone (ROCEPHIN) 2 g in sodium chloride 0.9 % 100 mL IVPB  Status:  Discontinued        2 g 200 mL/hr over 30 Minutes Intravenous Every 24 hours 07/18/19 1100 07/20/19 0744   07/15/19 2200  clindamycin (CLEOCIN) IVPB 600 mg  Status:  Discontinued        600 mg 100 mL/hr over 30 Minutes Intravenous Every 8 hours 07/15/19 1945 07/18/19 1101   07/15/19 1530  vancomycin (VANCOCIN) IVPB 1000 mg/200 mL premix        1,000 mg 200 mL/hr over 60 Minutes Intravenous  Once 07/15/19 1525 07/15/19 1755   07/15/19 1530  cefTRIAXone (ROCEPHIN) 2 g in sodium chloride 0.9 % 100 mL IVPB  2 g 200 mL/hr over 30 Minutes Intravenous  Once 07/15/19 1525 07/15/19 1647       Assessment/Plan T2DM Hx of CVA Hx of MI HTN Hypothyroidism COPD CHF GERD  Perianal abscess tracking into L buttock S/p I&D 6/17 Dr. Ninfa Linden - POD#4 - continue dressing changes BID or prn for soiling - sitz baths - agree with d/c today, follow up in chart   LOS: 9 days    Norm Parcel , [facility] 07/24/2019, 9:31 AM Please see Amion for pager number during day hours 7:00am-4:30pm

## 2019-07-24 NOTE — Care Management Important Message (Signed)
Important Message  Patient Details IM Letter given to Velva Harman RN Case Manager to present to the Patient Name: Caitlyn Aguirre MRN: [id number] Date of Birth: 13-May-1935   Medicare Important Message Given:  Yes     Kerin Salen 07/24/2019, 12:00 PM

## 2019-07-24 NOTE — TOC Transition Note (Addendum)
Transition of Care [facility]) - CM/SW Discharge Note   Patient Details  Name: EMMABELLE FEAR MRN: [id number] Date of Birth: 1935-06-14  Transition of Care [facility]) CM/SW Contact:  Lia Hopping, [place] Phone Number: 07/24/2019, 11:43 AM   Clinical Narrative:    CSW provided the patient daughter with [facility] [employer] customer service to discuss payment plan options. Patient has DME orders for RW, beside commode and wheelchair. Patient cannot receive these items until a payment plan has been arranged. Patient daughter report she called, and was notified to call back when she return home?? CSW reached out to [place]. Thedore Mins and notified him of patient need/barriers to discharge.   Patient daughter decline to workout equipment issue and request to take the patient home at this time without DME.  She reports she will follow up Linden on her own.      Barriers to Discharge: Equipment Delay   Patient Goals and CMS Choice Patient states their goals for this hospitalization and ongoing recovery are:: Patient wants to stablize and get oxygen at home.      Discharge Placement                       Discharge Plan and Services In-house Referral: Clinical Social Work Discharge Planning Services: CM Consult Post Acute Care Choice: Home Health, Durable Medical Equipment          DME Arranged: Walker rolling with seat, Oxygen DME Agency: [employer] Date DME Agency Contacted: 07/17/19 Time DME Agency Contacted: [phone] Representative spoke with at DME Agency: [place]: PT, RN [place] Agency: [place] Date [place]: 07/21/19 Time [place]: [place] Representative spoke with at [place]: Cindie  Social Determinants of Health ([place]) Interventions     Readmission Risk Interventions No flowsheet data found.

## 2019-07-24 NOTE — Progress Notes (Signed)
D/C instructions given to patient. Patient or daughter had no questions. NT or writer will wheel patient out once she is done with her lunch

## 2019-07-24 NOTE — Progress Notes (Signed)
   07/24/19 1300  Hydrotherapy note  Subjective Assessment  Subjective I need pain meds( IV meds given prior) ("it hurts. help me Reita Cliche")  Date of Onset 07/15/19  Prior Treatments s/p I&D 07/20/19  Evaluation and Treatment  Evaluation and Treatment Procedures Explained to Patient/Family Yes  Evaluation and Treatment Procedures agreed to  Wound / Incision (Open or Dehisced) 07/15/19 Incision - Open Buttocks Left L buttock abscess s/p I &D 07/20/19  Date First Assessed: 07/15/19   Wound Type: Incision - Open  Location: Buttocks  Location Orientation: Left  Wound Description (Comments): L buttock abscess s/p I &D 07/20/19  Dressing Type Gauze (Comment)  Dressing Changed Changed  Dressing Status Old drainage (BM)  Dressing Change Frequency Twice a day  Site / Wound Assessment Granulation tissue  % Wound base Red or Granulating 95%  % Wound base Yellow/Fibrinous Exudate 5%  Peri-wound Assessment Intact  Wound Length (cm) 7.5 cm  Wound Width (cm) 4 cm  Wound Depth (cm) 3 cm  Wound Volume (cm^3) 90 cm^3  Wound Surface Area (cm^2) 30 cm^2  Margins Unattached edges (unapproximated)  Drainage Amount Scant  Drainage Description Serosanguineous  Non-staged Wound Description Not applicable  Treatment Hydrotherapy (Pulse lavage);Packing (Saline gauze) (mepilex)  Hydrotherapy  Pulsed lavage therapy - wound location L buttock  Pulsed Lavage with Suction (psi) 8 psi  Pulsed Lavage with Suction - Normal Saline Used 1000 mL  Pulsed Lavage Tip Tip with splash shield  Selective Debridement  Selective Debridement - Location L buttocks  Selective Debridement - Tools Used Forceps;Scissors  Selective Debridement - Tissue Removed slough  Wound Therapy - Assess/Plan/Recommendations  Wound Therapy - Clinical Statement 84 yo female admitted with L buttock cellulitis/abscess. S/P I&D 07/20/19  Factors Delaying/Impairing Wound Healing Immobility;Incontinence  Hydrotherapy Plan Dressing  change;Debridement;Patient/family education;Pulsatile lavage with suction  Wound Therapy - Frequency 6X / week  Wound Therapy - Current Recommendations Case manager/social work  Wound Therapy - Follow Up Recommendations Home health RN;  Wound Plan Plans are to Dc home with [facility]. Daughter present and aware to change dressing when soiled from BM, DME recs made for BSC and RW and requesting WC. Wound is pink, painful. Wound actually looks really good. 95% red tissue. Pt did not tolerate procedure well. Will definintely need to be pre-medicated before hydrotherapy and she may also need pain meds after hydrotherapy. She was tearful during today's session.  Wound Therapy Goals - Improve the function of patient's integumentary system by progressing the wound(s) through the phases of wound healing by:  Decrease Necrotic Tissue to 2%  Decrease Necrotic Tissue - Progress Progressing toward goal  Increase Granulation Tissue to 98%  Increase Granulation Tissue - Progress Progressing toward goal  Goals/treatment plan/discharge plan were made with and agreed upon by patient/family Yes  Time For Goal Achievement 7 days  Wound Therapy - Potential for Goals Excellent  Tresa Endo PT Acute Rehabilitation Services Pager [phone] Office [phone]

## 2019-07-24 NOTE — Discharge Summary (Addendum)
Physician Discharge Summary  Caitlyn Aguirre WYO:[id number] DOB: 07-Feb-1935 DOA: 07/15/2019  PCP: Axel Filler, MD  Admit date: 07/15/2019 Discharge date: 07/24/2019  Time spent: 55 minutes  Recommendations for Outpatient Follow-up:  1. Home wound care has been ordered with therapy services as per general surgery who will review her in their office on 07/28/2019 for further instructions 2. Prescribing ciprofloxacin and Flagyl to decontaminate wound as it had mild Pseudomonas 3. Consider reinitiation of some blood pressure medications-were held 2/2 AKI this admission 4. Consider de-escalation off Metformin as will be decreasing dose of prednisone on discharge back to 5 mg 5. Recommend outpatient PFTs per PCP 6. Recommend TSH 3 weeks 7. Recommend Chem-12 CBC plus differential 1 week  Discharge Diagnoses:  Active Problems:   Hypothyroidism   Type 2 diabetes mellitus with other specified complication (HCC)   Essential hypertension   COPD (HCC)   Polymyalgia rheumatica (HCC)   CKD (chronic kidney disease) stage 3, GFR 30-59 ml/min   Cellulitis of buttock, left   Acute on chronic respiratory failure with hypoxia [facility])   Discharge Condition: Improved  Diet recommendation: Heart healthy diabetic  Filed Weights   07/15/19 1626  Weight: 93.4 kg    History of present illness:  84 year old black female Prior tobacco quit in [place] stage II-III COPD on home oxygen 2 L (07/18/2011 FEV/FVC 78% FEV1 86%)  DM TY 2, Probable polymyalgia rheumatica HFpEF echocardiogram 07/11/2018 55-60% EF, PASP 38 mm  HTN, hypothyroidism CAD status post DES-low risk stress test 10/31/2014 TIA Status post lap chole 01/18/2016  Admit 07/15/2019 from urgent care with hypoxia-on exam found to have erythematous indurated left upper buttock gluteal cleft cellulitis Started on clindamycin and pain control Developed some low blood pressures  Ultimately was found to have an abscess and general  surgery performed an I&D on this She currently has moderate pain and we are waiting on cultures to disposition her properly  Hospital Course:  1. Natal cleft cellulitis + Asbcess status post incision and drainage Dr. Mercy Riding 07/20/2019 a. CT scan 6/16 = abscess--appreciate Dr. Ninfa Linden input and surgery 6/17 b. wound not examined today-appreciate review by Dr. Dema Severin 6/19 no further surgery needed c. Wound culture shows Pseudomonas placing on Cipro Flagyl on discharge d. WBC has trended downward over hospital stay e. With Penrose drain debility and advanced age she surprisingly did well enough to be able to discharge home and will get home health therapies on discharge in addition to the wound care f. Had Hyrdotherapy--cleared by general surgery for discharge g. Pain controlled on discharge i. first choice ibuprofen 600 3 times daily  ii. second choice oxycodone 10 every 4 as needed  iii. we will de-escalate as as needed 2. COPD stage III-IV-- oxygen 2 L and will need to be on this on discharge  a. Outpatient PFts per PCP  b. Continue meds albuterol 2.5 Neb 3. Acute blood loss anemia a. 2/2 surgery/volume b. Hemoglobin stable currently 4. AKI superimposed on admission on CKD 3B             Mild metabolic acidosis              hypomagnesemia, and hyperkalemia a. Holding diuretic antihypertensives at this time b. Continue Coreg 25 twice daily (unusually high dose) c. Her labs are concordant with her prior to admission labs 5. Polymyalgia rheumatica a. Was on prednisone 17.5 appears she is on 5 mg in the outpatient setting which we will reassess with PCP  b. ?  Lower dose metformin in the outpatient setting as below 6. DM TY 2 a. ? D/c Metformin eventually b. Currently on sliding scale coverage sugars 144--207 c. Goal A1c probably above 8.0 given advanced age etc. 71. HFpEF with low risk stress test 2016 a. Continue goal-directed therapy Coreg 25 twice daily continue amlodipine  5-Hygroton/lisinopril have been held since admission 8. Prior TIA a. On aspirin 81 b. Will need lipid panel 3 to 6 months also continue Lipitor 40 9. Prior lap chole 10. Hypothyroid a. Continue levothyroxine 88 mcg b. Consider TSH in 3 to 4 weeks  Procedures:  I&D  Consultations:  General surgery  Discharge Exam: Vitals:   07/24/19 0617 07/24/19 0837  BP: (!) 124/53 (!) 115/48  Pulse: (!) 52 (!) 51  Resp: 15 18  Temp: 98.1 F (36.7 C) 98.1 F (36.7 C)  SpO2: 100% 99%    patient pleasant alert oriented eating and drinking can pull herself up in bed she seems happy And is wishing to go home  General: Awake coherent no distress EOMI NCAT no focal deficit smiling sitting up in bed pretty independently no chest pain Cardiovascular: S1-S2 no murmur Respiratory: Chest clear no added sound Abdomen soft no rebound no guarding  Discharge Instructions   Discharge Instructions    Call MD for:  difficulty breathing, headache or visual disturbances   Complete by: As directed    Call MD for:  severe uncontrolled pain   Complete by: As directed    Call MD for:  temperature >100.4   Complete by: As directed    Change dressing (specify)   Complete by: As directed    See above   Diet - low sodium heart healthy   Complete by: As directed    Discharge instructions   Complete by: As directed    Complete full course of Augmentin and follow-up with surgery this Friday Follow-up with primary care physician to discuss some medication changes-you will notice some of the meds changed You will go back to your dose of 5 mg of prednisone on discharge I would encourage that you also keep a log of your blood sugars prior to discharge and when you see Dr. Damita Dunnings he can advise you accordingly in terms of other meds that are needed or may be even coming down off of the Metformin as your sugars will probably get better controlled on lower amounts of steroids Try to move and ambulate and do his  best as he can Good luck and have a nice summer   Increase activity slowly   Complete by: As directed    No wound care   Complete by: As directed      Allergies as of 07/24/2019   No Known Allergies     Medication List    STOP taking these medications   chlorthalidone 25 MG tablet Commonly known as: HYGROTON   lisinopril 40 MG tablet Commonly known as: ZESTRIL   Vitamin D3 10 MCG (400 UNIT) tablet     TAKE these medications   Accu-Chek Guide Me w/Device Kit Check 1 times a day as instructed   Accu-Chek Guide test strip Generic drug: glucose blood Check blood sugar 1 time per day   Accu-Chek Softclix Lancets lancets Check 1 time a day as instructed   acetaminophen 325 MG tablet Commonly known as: TYLENOL Take 2 tablets (650 mg total) by mouth every 6 (six) hours as needed for mild pain (or Fever >/= 101).   albuterol (  2.5 MG/3ML) 0.083% nebulizer solution Commonly known as: PROVENTIL Take 3 mLs (2.5 mg total) by nebulization every 4 (four) hours as needed for wheezing or shortness of breath.   albuterol 108 (90 Base) MCG/ACT inhaler Commonly known as: VENTOLIN HFA Inhale 1-2 puffs into the lungs every 6 (six) hours as needed for wheezing or shortness of breath.   amLODipine 5 MG tablet Commonly known as: NORVASC Take 1 tablet (5 mg total) by mouth daily.   aspirin EC 81 MG tablet Take 1 tablet (81 mg total) by mouth daily.   atorvastatin 40 MG tablet Commonly known as: LIPITOR Take 1 tablet (40 mg total) by mouth daily at 6 PM. What changed: when to take this   carvedilol 25 MG tablet Commonly known as: COREG Take 1 tablet (25 mg total) by mouth 2 (two) times daily with a meal.   ciprofloxacin 500 MG tablet Commonly known as: Cipro Take 1 tablet (500 mg total) by mouth 2 (two) times daily for 7 days.   ibuprofen 600 MG tablet Commonly known as: ADVIL Take 1 tablet (600 mg total) by mouth 3 (three) times daily.   insulin aspart 100 UNIT/ML  injection Commonly known as: novoLOG Inject 0-9 Units into the skin 3 (three) times daily with meals.   latanoprost 0.005 % ophthalmic solution Commonly known as: XALATAN Place 1 drop into both eyes at bedtime.   levothyroxine 88 MCG tablet Commonly known as: SYNTHROID Take 1 tablet (88 mcg total) by mouth daily before breakfast.   metFORMIN 1000 MG tablet Commonly known as: GLUCOPHAGE Take 1 tablet (1,000 mg total) by mouth 2 (two) times daily with a meal.   metroNIDAZOLE 500 MG tablet Commonly known as: Flagyl Take 1 tablet (500 mg total) by mouth 3 (three) times daily for 7 days.   omeprazole 20 MG capsule Commonly known as: PRILOSEC Take 1 capsule (20 mg total) by mouth daily.   OVER THE COUNTER MEDICATION Take 1 capsule by mouth as needed (heartburn and gas). Heartburn and gas chews 779m calcium carbonate and 84msimethicone   Oxycodone HCl 10 MG Tabs Take 1 tablet (10 mg total) by mouth every 4 (four) hours as needed for breakthrough pain.   OXYGEN Inhale 2 L into the lungs daily as needed (for breathing).   PARoxetine 30 MG tablet Commonly known as: PAXIL Take 1 tablet (30 mg total) by mouth daily.   predniSONE 5 MG tablet Commonly known as: DELTASONE Take 3.5 tablets (17.5 mg total) by mouth daily with breakfast for 28 days, THEN 3 tablets (15 mg total) daily with breakfast for 28 days, THEN 2.5 tablets (12.5 mg total) daily with breakfast for 28 days, THEN 2 tablets (10 mg total) daily with breakfast for 28 days. Start taking on: July 10, 2019   ramelteon 8 MG tablet Commonly known as: ROZEREM Take 1 tablet (8 mg total) by mouth at bedtime.   Spiriva HandiHaler 18 MCG inhalation capsule Generic drug: tiotropium INHALE THE CONTENTS OF 1  CAPSULE BY MOUTH VIA  HANDIHALER DAILY What changed: See the new instructions.            Durable Medical Equipment  (From admission, onward)         Start     Ordered   07/24/19 1005  For home use only DME  Bedside commode  Once       Question:  Patient needs a bedside commode to treat with the following condition  Answer:  Rectal abscess  07/24/19 1006   07/24/19 1005  For home use only DME standard manual wheelchair with seat cushion  Once       Comments: Patient suffers from rectal abscess which impairs their ability to perform daily activities like bathing, dressing, and feeding in the home.  A crutch will not resolve issue with performing activities of daily living. A wheelchair will allow patient to safely perform daily activities. Patient can safely propel the wheelchair in the home or has a caregiver who can provide assistance. Length of need Lifetime. Accessories: elevating leg rests (ELRs), wheel locks, extensions and anti-tippers.   07/24/19 1006           Discharge Care Instructions  (From admission, onward)         Start     Ordered   07/24/19 0000  Change dressing (specify)       Comments: See above   07/24/19 0932         No Known Allergies  Follow-up Information    Surgery, [place]. Go on 07/28/2019.   Specialty: General Surgery Why: Follow up appointment scheduled for 11:30 AM. Please arrive 30 min prior to appointment time. Bring photo ID and insurance information.  Contact information: [place] [id number] [phone]        Care, [facility] Follow up.   Specialty: Home Health Services Why: RN and Physical Therapy Services. Start of Care 6/22. The agency will contact you prior to first visit.  Contact information: [place] [address] [phone]                The results of significant diagnostics from this hospitalization (including imaging, microbiology, ancillary and laboratory) are listed below for reference.    Significant Diagnostic Studies: DG Chest 2 View  Result Date: 07/15/2019 CLINICAL DATA:  Sepsis. EXAM: CHEST - 2 VIEW COMPARISON:  Chest x-ray dated Jun 11, 2019. FINDINGS: The heart size and mediastinal contours are within normal limits. Normal pulmonary vascularity. Chronic bibasilar scarring and bronchiectasis. No focal consolidation, pleural effusion, or pneumothorax. No acute osseous abnormality. IMPRESSION: No active cardiopulmonary disease. Electronically Signed   By: Titus Dubin M.D.   On: 07/15/2019 15:55   CT PELVIS W CONTRAST  Result Date: 07/19/2019 CLINICAL DATA:  Left upper buttock gluteal cleft cellulitis with induration and erythema. EXAM: CT PELVIS WITH CONTRAST TECHNIQUE: Multidetector CT imaging of the pelvis was performed using the standard protocol following the bolus administration of intravenous contrast. CONTRAST:  83m OMNIPAQUE IOHEXOL 300 MG/ML  SOLN COMPARISON:  Superficial pelvic ultrasound 07/17/2019. Pelvic CT 02/02/2018. FINDINGS: Urinary Tract: The visualized distal ureters and bladder appear unremarkable. Bowel: No bowel wall thickening, distention or surrounding inflammation identified within the pelvis. Sigmoid colon diverticulosis. Vascular/Lymphatic: No enlarged pelvic lymph nodes identified. Aortoiliac atherosclerosis without aneurysm or large vessel occlusion. Reproductive: Stable low-density left adnexal lesion measuring 3.0 x 2.1 cm on image 8/2, consistent with a benign cyst. There are stable adnexal calcifications bilaterally. Probable 17 mm uterine fibroid on image 22/2. Other: Postsurgical changes in the anterior abdominal wall. No ascites. There is subcutaneous edema around the intergluteal fold with asymmetric inflammation and probable ill-defined fluid on the left, measuring up to 5.2 x 3.0 cm on image 45/2. This is likely contiguous with fluid extending along the posterior right aspect of the anus and measuring up to 4.9 x 1.6 cm on image 49/2. No air or foreign bodies within these collections. Enteric  contrast was not administered. No obvious overlying skin ulceration. Musculoskeletal: No acute or worrisome  osseous findings. There are degenerative changes of both hips, sacroiliac joints in the lower lumbar spine. No evidence of osteomyelitis. IMPRESSION: 1. Subcutaneous edema around the intergluteal fold with asymmetric inflammatory and probable ill-defined fluid on the left, likely contiguous with fluid extending along the posterior right aspect of the anus. These findings are suspicious for cellulitis and probable early abscess formation. Etiology is not clearly demonstrated; underlying rectal fistula should be considered. 2. No evidence of osteomyelitis or intrapelvic inflammation. 3. Aortic Atherosclerosis (ICD10-I70.0). Electronically Signed   By: Richardean Sale M.D.   On: 07/19/2019 16:52   US PELVIS LIMITED (TRANSABDOMINAL ONLY)  Result Date: 07/17/2019 CLINICAL DATA:  Left medial buttock abscess. EXAM: LIMITED ULTRASOUND OF PELVIS TECHNIQUE: Limited transabdominal ultrasound examination of the pelvis was performed. COMPARISON:  None. FINDINGS: Targeted ultrasound of the medial left buttocks in the area of clinical concern demonstrates soft tissue edema without a fluid collection. IMPRESSION: No abscess identified. Electronically Signed   By: Logan Bores M.D.   On: 07/17/2019 13:50    Microbiology: Recent Results (from the past 240 hour(s))  Culture, blood (Routine x 2)     Status: None   Collection Time: 07/15/19  3:19 PM   Specimen: BLOOD LEFT FOREARM  Result Value Ref Range Status   Specimen Description   Final    BLOOD LEFT FOREARM Performed at [facility] [address], [place], [place] [address]    Special Requests   Final    BOTTLES DRAWN AEROBIC AND ANAEROBIC Blood Culture adequate volume Performed at [facility] [address] [place], [place] [address]    Culture   Final    NO GROWTH 5 DAYS Performed at [facility], [name] [address] [place], [place] [address]    Report Status 07/20/2019 FINAL  Final  Culture, blood (Routine x  2)     Status: None   Collection Time: 07/15/19  3:24 PM   Specimen: BLOOD  Result Value Ref Range Status   Specimen Description   Final    BLOOD RIGHT ANTECUBITAL Performed at [facility], [place] [address], [place], [place] [address]    Special Requests   Final    BOTTLES DRAWN AEROBIC AND ANAEROBIC Blood Culture results may not be optimal due to an excessive volume of blood received in culture bottles Performed at [facility] [address], [place], [place] [address]    Culture   Final    NO GROWTH 5 DAYS Performed at [facility], [place] [address] [place], [place] [address]    Report Status 07/20/2019 FINAL  Final  SARS Coronavirus 2 by RT PCR (hospital order, performed in [facility] hospital lab) Nasopharyngeal Nasopharyngeal Swab     Status: None   Collection Time: 07/15/19  4:52 PM   Specimen: Nasopharyngeal Swab  Result Value Ref Range Status   SARS Coronavirus 2 NEGATIVE NEGATIVE Final    Comment: (NOTE) SARS-CoV-2 target nucleic acids are NOT DETECTED.  The SARS-CoV-2 RNA is generally detectable in upper and lower respiratory specimens during the acute phase of infection. The lowest concentration of SARS-CoV-2 viral copies this assay can detect is 250 copies / mL. A negative result does not preclude SARS-CoV-2 infection and should not be used as the sole basis for treatment or other patient management decisions.  A negative result may occur with improper specimen collection /  handling, submission of specimen other than nasopharyngeal swab, presence of viral mutation(s) within the areas targeted by this assay, and inadequate number of viral copies (<250 copies / mL). A negative result must be combined with clinical observations, patient history, and epidemiological information.  Fact Sheet for Patients:   StrictlyIdeas.no  Fact Sheet for Healthcare Providers: BankingDealers.co.za  This  test is not yet approved or  cleared by the [place] FDA and has been authorized for detection and/or diagnosis of SARS-CoV-2 by FDA under an Emergency Use Authorization (EUA).  This EUA will remain in effect (meaning this test can be used) for the duration of the COVID-19 declaration under Section 564(b)(1) of the Act, 21 U.S.C. section 360bbb-3(b)(1), unless the authorization is terminated or revoked sooner.  Performed at [facility], [place] [address] [place], [place] [address]   Urine culture     Status: Abnormal   Collection Time: 07/15/19  6:00 PM   Specimen: In/Out Cath Urine  Result Value Ref Range Status   Specimen Description   Final    IN/OUT CATH URINE Performed at [facility] [address] [place], [place] [address]    Special Requests   Final    NONE Performed at [facility], [place] [address], [place], [place] [address]    Culture MULTIPLE SPECIES PRESENT, SUGGEST RECOLLECTION (A)  Final   Report Status 07/17/2019 FINAL  Final  Surgical pcr screen     Status: None   Collection Time: 07/20/19  8:36 AM   Specimen: Nasal Mucosa; Nasal Swab  Result Value Ref Range Status   MRSA, PCR NEGATIVE NEGATIVE Final   Staphylococcus aureus NEGATIVE NEGATIVE Final    Comment: (NOTE) The Xpert SA Assay (FDA approved for NASAL specimens in patients 1 years of age and older), is one component of a comprehensive surveillance program. It is not intended to diagnose infection nor to guide or monitor treatment. Performed at [facility], [place] [address], [place], [place] [address]   Aerobic/Anaerobic Culture (surgical/deep wound)     Status: None (Preliminary result)   Collection Time: 07/20/19 12:00 PM   Specimen: PATH Other; Tissue  Result Value Ref Range Status   Specimen Description   Final    ABSCESS PERI ANAL Performed at [facility] [address], [place], [place]  [address]    Special Requests   Final    NONE Performed at [facility], [place] [address], [place], [place] [address]    Gram Stain   Final    RARE WBC PRESENT, PREDOMINANTLY PMN ABUNDANT GRAM POSITIVE COCCI FEW GRAM NEGATIVE RODS RARE GRAM POSITIVE RODS Performed at [facility], [place] [address] [place], [place] [address]    Culture   Final    RARE PSEUDOMONAS AERUGINOSA MIXED ANAEROBIC FLORA PRESENT.  CALL LAB IF FURTHER IID REQUIRED.    Report Status PENDING  Incomplete   Organism ID, Bacteria PSEUDOMONAS AERUGINOSA  Final      Susceptibility   Pseudomonas aeruginosa - MIC*    CEFTAZIDIME 4 SENSITIVE Sensitive     CIPROFLOXACIN <=0.25 SENSITIVE Sensitive     GENTAMICIN <=1 SENSITIVE Sensitive     IMIPENEM <=0.25 SENSITIVE Sensitive     PIP/TAZO 8 SENSITIVE Sensitive     CEFEPIME 2 SENSITIVE Sensitive     * RARE PSEUDOMONAS AERUGINOSA     Labs: Basic Metabolic Panel: Recent Labs  Lab 07/18/19 0624 07/19/19 0859 07/20/19 1529 07/21/19 0510  07/22/19 0537 07/23/19 0525 07/24/19 0457  NA 134*   < > 130* 130* 134* 132* 137  K 4.5   < > 4.8 4.8 4.8 4.7 4.8  CL 104   < > 99 101 100 102 104  CO2 20*   < > 21* _0 GLUCOSE 146*   < > 202* 109* 121* 144* 125*  BUN 35*   < > 37* 33* 34* 29* 27*  CREATININE 1.43*   < > 1.47* 1.64* 1.42* 1.39* 1.48*  CALCIUM 9.5   < > 9.1 9.2 9.8 9.3 9.6  MG 1.5*  --   --   --  2.2  --   --    < > = values in this interval not displayed.   Liver Function Tests: Recent Labs  Lab 07/20/19 1529 07/21/19 0510 07/22/19 0537 07/23/19 0525 07/24/19 0457  AST _1 ALT _2 ALKPHOS 78 77 89 74 65  BILITOT 0.8 0.9 0.8 0.5 0.6  PROT 6.2* 6.0* 6.5 5.9* 5.8*  ALBUMIN 2.7* 2.4* 2.6* 2.4* 2.4*   No results for input(s): LIPASE, AMYLASE in the last 168 hours. No results for input(s): AMMONIA in the last 168 hours. CBC: Recent Labs  Lab 07/20/19 0522 07/21/19 0510 07/22/19 0537  07/23/19 0525 07/24/19 0457  WBC 22.4* 19.0* 21.1* 17.9* 14.9*  NEUTROABS 17.8* 15.0* 17.2* 13.7* 10.9*  HGB 11.2* 8.7* 9.6* 9.0* 9.1*  HCT 33.3* 25.4* 29.6* 27.2* 27.2*  MCV 83.5 83.0 85.3 85.3 84.7  PLT 225 239 285 284 319   Cardiac Enzymes: No results for input(s): CKTOTAL, CKMB, CKMBINDEX, TROPONINI in the last 168 hours. BNP: BNP (last 3 results) Recent Labs    06/11/19 1350  BNP 19.1    ProBNP (last 3 results) No results for input(s): PROBNP in the last 8760 hours.  CBG: Recent Labs  Lab 07/23/19 0808 07/23/19 1219 07/23/19 1626 07/23/19 2136 07/24/19 0742  GLUCAP 142* 207* 305* 226* 121*       Signed:  Nita Sells MD   [facility] 07/24/2019, 9:35 AM

## 2019-07-25 ENCOUNTER — Telehealth: Payer: Medicare Other

## 2019-07-25 ENCOUNTER — Telehealth: Payer: Self-pay | Admitting: *Deleted

## 2019-07-25 LAB — AEROBIC/ANAEROBIC CULTURE W GRAM STAIN (SURGICAL/DEEP WOUND)

## 2019-07-25 NOTE — Telephone Encounter (Signed)
Stated pt was discharged from the hospital yesterday; she had surgery for an abscess.  Stated pt is in a lot of pain, unable to sleep. And was told 2 meds were being ordered for pain but she only was given 1 (Ibuprofen) at the pharmacy. Per Epic, Oxycodone was ordered but "Print" not sent electronically; stated she did not receive a hard copy. Informed pt's daughter to call the surgeon's office to let them know rx was not sent to the pharmacy.

## 2019-07-25 NOTE — Telephone Encounter (Signed)
F/U call - talked to pt's daughter, Ms Minerva Ends, stated she called the surgeon's office and pain med was orderd.

## 2019-07-26 ENCOUNTER — Ambulatory Visit: Payer: Medicare Other | Admitting: *Deleted

## 2019-07-26 DIAGNOSIS — I10 Essential (primary) hypertension: Secondary | ICD-10-CM

## 2019-07-26 DIAGNOSIS — E1169 Type 2 diabetes mellitus with other specified complication: Secondary | ICD-10-CM

## 2019-07-26 DIAGNOSIS — N1832 Chronic kidney disease, stage 3b: Secondary | ICD-10-CM

## 2019-07-26 DIAGNOSIS — M353 Polymyalgia rheumatica: Secondary | ICD-10-CM

## 2019-07-26 NOTE — Patient Instructions (Signed)
Visit Information I am glad you are feeling better. Be sure you keep your appointments with the surgeon on 6/25 and with Dr Evette Doffing on 6/28.  Goals Addressed              This Visit's Progress     Patient Stated   .  "I don't have a glucometer and my doctor told me to check my blood sugar every day." (pt-stated)        CARE PLAN ENTRY (see longitudinal plan of care for additional care plan information)  Current Barriers:  . Chronic Disease Management support, education, and care coordination needs related to CHF, HTN, HLD, COPD, DMII, CKD Stage 3, and Depression- spoke with patient and then daughter Fraser Din as patient was groggy from a nap and from the narcotic pain medicine she is taking, Fraser Din says she has not heard from the home health agency yet and she is anxious to talk with someone to make sure she is doing the dressing change to lesion on patient's buttock correctly, she also says she is giving the patient the medications as directed on the hospital discharge instruction sheet but says she told the providers she would not give her Mom insulin because "she doesn't need it now that she's not in the hospital.' She reports her fasting blood sugar yesterday was 123.  Clinical Goal(s) related to CHF, HTN, HLD, COPD, DMII, CKD Stage 3, and Depression:  Over the next  30-60 days, patient will:  . Work with the care management team to address educational, disease management, and care coordination needs  . Begin or continue self health monitoring activities as directed today Measure and record cbg (blood glucose) at least one times daily . Call provider office for new or worsened signs and symptoms Blood glucose findings outside established parameters, Chest pain, Shortness of breath, and New or worsened symptom related to HF, COPD, HTN  or DM . Call care management team with questions or concerns . Verbalize basic understanding of patient centered plan of care established today  Interventions  related to CHF, HTN, HLD, COPD, DMII, CKD Stage 3, and Depression:  . Completed post hospital transition of care call with patient's daughter Fraser Din . Called [facility] to verify start of services as 6/24, advised them to change primary contact to patient's daughter Fraser Din and provided [place] with daughter's name and contact information  . Called Fraser Din to advise her of [facility] start of service and that she has been named the primary contact for the [facility] agency  . Reviewed upcoming provider appointments with surgeon on 62/5 and with primary care provider on 6/28 and ensured patient has transportation. . Will message provider that daughter did not agree with giving patient mealtime insulin at discharge from hospital    Patient Self Care Activities related to CHF, HTN, HLD, COPD, DMII, CKD Stage 3, and Depression:  . Patient is unable to independently self-manage chronic health conditions  Please see past updates related to this goal by clicking on the "Past Updates" button in the selected goal         The patient verbalized understanding of instructions provided today and declined a print copy of patient instruction materials.   The care management team will reach out to the patient again over the next 30-60 days.   Kelli Churn RN, CCM, [facility] RN Care Manager [phone]

## 2019-07-26 NOTE — Chronic Care Management (AMB) (Signed)
Chronic Care Management   Follow Up Note   07/26/2019 Name: Caitlyn Aguirre MRN: [id number] DOB: 11/13/1935  Referred by: Axel Filler, MD Reason for referral : Chronic Care Management (HTN, DM, COPD, CKD, HF)   Caitlyn Aguirre is a 84 y.o. year old female who is a primary care patient of Axel Filler, MD. The CCM team was consulted for assistance with chronic disease management and care coordination needs.    Review of patient status, including review of consultants reports, relevant laboratory and other test results, and collaboration with appropriate care team members and the patient's provider was performed as part of comprehensive patient evaluation and provision of chronic care management services.    SDOH (Social Determinants of Health) assessments performed: No See Care Plan activities for detailed interventions related to [facility])     Outpatient Encounter Medications as of 07/26/2019  Medication Sig Note  . Accu-Chek Softclix Lancets lancets Check 1 time a day as instructed   . acetaminophen (TYLENOL) 325 MG tablet Take 2 tablets (650 mg total) by mouth every 6 (six) hours as needed for mild pain (or Fever >/= 101).   Marland Kitchen albuterol (PROVENTIL) (2.5 MG/3ML) 0.083% nebulizer solution Take 3 mLs (2.5 mg total) by nebulization every 4 (four) hours as needed for wheezing or shortness of breath.   Marland Kitchen albuterol (VENTOLIN HFA) 108 (90 Base) MCG/ACT inhaler Inhale 1-2 puffs into the lungs every 6 (six) hours as needed for wheezing or shortness of breath.   Marland Kitchen amLODipine (NORVASC) 5 MG tablet Take 1 tablet (5 mg total) by mouth daily.   Marland Kitchen aspirin EC 81 MG tablet Take 1 tablet (81 mg total) by mouth daily.   Marland Kitchen atorvastatin (LIPITOR) 40 MG tablet Take 1 tablet (40 mg total) by mouth daily at 6 PM. (Patient taking differently: Take 40 mg by mouth daily. )   . Blood Glucose Monitoring Suppl (ACCU-CHEK GUIDE ME) w/Device KIT Check 1 times a day as instructed   . carvedilol (COREG) 25  MG tablet Take 1 tablet (25 mg total) by mouth 2 (two) times daily with a meal.   . ciprofloxacin (CIPRO) 500 MG tablet Take 1 tablet (500 mg total) by mouth 2 (two) times daily for 7 days.   Marland Kitchen glucose blood (ACCU-CHEK GUIDE) test strip Check blood sugar 1 time per day   . ibuprofen (ADVIL) 600 MG tablet Take 1 tablet (600 mg total) by mouth 3 (three) times daily.   . insulin aspart (NOVOLOG) 100 UNIT/ML injection Inject 0-9 Units into the skin 3 (three) times daily with meals. Daughter refuses to give patient insulin  . latanoprost (XALATAN) 0.005 % ophthalmic solution Place 1 drop into both eyes at bedtime.   Marland Kitchen levothyroxine (SYNTHROID) 88 MCG tablet Take 1 tablet (88 mcg total) by mouth daily before breakfast.   . metFORMIN (GLUCOPHAGE) 1000 MG tablet Take 1 tablet (1,000 mg total) by mouth 2 (two) times daily with a meal.   . metroNIDAZOLE (FLAGYL) 500 MG tablet Take 1 tablet (500 mg total) by mouth 3 (three) times daily for 7 days.   Marland Kitchen omeprazole (PRILOSEC) 20 MG capsule Take 1 capsule (20 mg total) by mouth daily.   Marland Kitchen OVER THE COUNTER MEDICATION Take 1 capsule by mouth as needed (heartburn and gas). Heartburn and gas chews 739m calcium carbonate and 834msimethicone   . oxyCODONE 10 MG TABS Take 1 tablet (10 mg total) by mouth every 4 (four) hours as needed for breakthrough pain.   .Marland Kitchen  OXYGEN Inhale 2 L into the lungs daily as needed (for breathing).  07/15/2019: Ran out.  Marland Kitchen PARoxetine (PAXIL) 30 MG tablet Take 1 tablet (30 mg total) by mouth daily.   . predniSONE (DELTASONE) 5 MG tablet Take 3.5 tablets (17.5 mg total) by mouth daily with breakfast for 28 days, THEN 3 tablets (15 mg total) daily with breakfast for 28 days, THEN 2.5 tablets (12.5 mg total) daily with breakfast for 28 days, THEN 2 tablets (10 mg total) daily with breakfast for 28 days. 07/15/2019: Patient's daughter called back after confirming the medication bottle at home, she is taking 5 mg once daily.  . ramelteon (ROZEREM) 8 MG  tablet Take 1 tablet (8 mg total) by mouth at bedtime. 07/15/2019: Lf 06/14/19 for 30 days  . SPIRIVA HANDIHALER 18 MCG inhalation capsule INHALE THE CONTENTS OF 1  CAPSULE BY MOUTH VIA  HANDIHALER DAILY (Patient taking differently: Place 18 mcg into inhaler and inhale daily as needed (for wheezing or shortness of breath.). )    No facility-administered encounter medications on file as of 07/26/2019.     Objective:  Patient was hospitalized at [facility] from 6/12-6/21 for Cellulitis of left  buttock, left and acute on chronic respiratory failure with hypoxia. Transition of care call completed.  BP Readings from Last 3 Encounters:  07/24/19 (!) 115/48  07/15/19 137/81  07/10/19 118/64   Wt Readings from Last 3 Encounters:  07/15/19 206 lb (93.4 kg)  07/10/19 207 lb 4.8 oz (94 kg)  06/15/19 211 lb 4.8 oz (95.8 kg)   Lab Results  Component Value Date   HGBA1C 9.2 (H) 07/16/2019   HGBA1C 7.1 (A) 05/08/2019   HGBA1C 6.8 (A) 11/21/2018   Lab Results  Component Value Date   MICROALBUR 1.52 10/12/2013   LDLCALC 108 (H) 11/29/2017   CREATININE 1.48 (H) 07/24/2019   Lab Results  Component Value Date   MICROALBUR 1.52 10/12/2013   MICROALBUR 0.63 09/02/2012    Lab Results  Component Value Date   CHOL 188 11/29/2017   HDL 57 11/29/2017   LDLCALC 108 (H) 11/29/2017   TRIG 113 11/29/2017   CHOLHDL 3.3 11/29/2017  please update annual lipid panel and urine for protein   Goals Addressed              This Visit's Progress     Patient Stated   .  "I don't have a glucometer and my doctor told me to check my blood sugar every day." (pt-stated)        CARE PLAN ENTRY (see longitudinal plan of care for additional care plan information)  Current Barriers:  . Chronic Disease Management support, education, and care coordination needs related to CHF, HTN, HLD, COPD, DMII, CKD Stage 3, and Depression- spoke with patient and then daughter Fraser Din as patient was groggy from a  nap and from the narcotic pain medicine she is taking, Fraser Din says she has not heard from the home health agency yet and she is anxious to talk with someone to make sure she is doing the dressing change to lesion on patient's buttock correctly, she also says she is giving the patient the medications as directed on the hospital discharge instruction sheet but says she told the providers she would not give her Mom insulin because "she doesn't need it now that she's not in the hospital." She reports patient's fasting blood sugar yesterday was 123.  Clinical Goal(s) related to CHF, HTN, HLD, COPD, DMII, CKD Stage 3,  and Depression:  Over the next  30-60 days, patient will:  . Work with the care management team to address educational, disease management, and care coordination needs  . Begin or continue self health monitoring activities as directed today Measure and record cbg (blood glucose) at least one times daily . Call provider office for new or worsened signs and symptoms Blood glucose findings outside established parameters, Chest pain, Shortness of breath, and New or worsened symptom related to HF, COPD, HTN  or DM . Call care management team with questions or concerns . Verbalize basic understanding of patient centered plan of care established today  Interventions related to CHF, HTN, HLD, COPD, DMII, CKD Stage 3, and Depression:  . Completed post hospital transition of care call with patient's daughter Fraser Din . Called [facility] to verify start of services as 6/24, advised them to change primary contact to patient's daughter Fraser Din and provided [place] with daughter's name and contact information  . Called Fraser Din to advise her of [facility] start of service and that she has been named the primary contact for the [facility] agency  . Reviewed upcoming provider appointments with surgeon on 62/5 and with primary care provider on 6/28 and ensured patient has transportation. . Will message provider that daughter did  not agree with giving patient mealtime insulin at discharge from hospital    Patient Self Care Activities related to CHF, HTN, HLD, COPD, DMII, CKD Stage 3, and Depression:  . Patient is unable to independently self-manage chronic health conditions  Please see past updates related to this goal by clicking on the "Past Updates" button in the selected goal          Plan:   The care management team will reach out to the patient again over the next 30-60 days.    Kelli Churn RN, CCM, [facility] RN Care Manager [phone]

## 2019-07-27 ENCOUNTER — Other Ambulatory Visit: Payer: Self-pay | Admitting: Student in an Organized Health Care Education/Training Program

## 2019-07-27 DIAGNOSIS — D62 Acute posthemorrhagic anemia: Secondary | ICD-10-CM | POA: Diagnosis not present

## 2019-07-27 DIAGNOSIS — R109 Unspecified abdominal pain: Secondary | ICD-10-CM

## 2019-07-27 DIAGNOSIS — D631 Anemia in chronic kidney disease: Secondary | ICD-10-CM | POA: Diagnosis not present

## 2019-07-27 DIAGNOSIS — E039 Hypothyroidism, unspecified: Secondary | ICD-10-CM

## 2019-07-27 DIAGNOSIS — N179 Acute kidney failure, unspecified: Secondary | ICD-10-CM | POA: Diagnosis not present

## 2019-07-27 DIAGNOSIS — Z8673 Personal history of transient ischemic attack (TIA), and cerebral infarction without residual deficits: Secondary | ICD-10-CM | POA: Diagnosis not present

## 2019-07-27 DIAGNOSIS — K219 Gastro-esophageal reflux disease without esophagitis: Secondary | ICD-10-CM

## 2019-07-27 DIAGNOSIS — E1122 Type 2 diabetes mellitus with diabetic chronic kidney disease: Secondary | ICD-10-CM | POA: Diagnosis not present

## 2019-07-27 DIAGNOSIS — M47816 Spondylosis without myelopathy or radiculopathy, lumbar region: Secondary | ICD-10-CM | POA: Diagnosis not present

## 2019-07-27 DIAGNOSIS — Z7952 Long term (current) use of systemic steroids: Secondary | ICD-10-CM | POA: Diagnosis not present

## 2019-07-27 DIAGNOSIS — I700 Atherosclerosis of aorta: Secondary | ICD-10-CM | POA: Diagnosis not present

## 2019-07-27 DIAGNOSIS — M353 Polymyalgia rheumatica: Secondary | ICD-10-CM | POA: Diagnosis not present

## 2019-07-27 DIAGNOSIS — J479 Bronchiectasis, uncomplicated: Secondary | ICD-10-CM | POA: Diagnosis not present

## 2019-07-27 DIAGNOSIS — I10 Essential (primary) hypertension: Secondary | ICD-10-CM

## 2019-07-27 DIAGNOSIS — L03317 Cellulitis of buttock: Secondary | ICD-10-CM | POA: Diagnosis not present

## 2019-07-27 DIAGNOSIS — Z9981 Dependence on supplemental oxygen: Secondary | ICD-10-CM | POA: Diagnosis not present

## 2019-07-27 DIAGNOSIS — Z7982 Long term (current) use of aspirin: Secondary | ICD-10-CM | POA: Diagnosis not present

## 2019-07-27 DIAGNOSIS — I130 Hypertensive heart and chronic kidney disease with heart failure and stage 1 through stage 4 chronic kidney disease, or unspecified chronic kidney disease: Secondary | ICD-10-CM | POA: Diagnosis not present

## 2019-07-27 DIAGNOSIS — J9621 Acute and chronic respiratory failure with hypoxia: Secondary | ICD-10-CM | POA: Diagnosis not present

## 2019-07-27 DIAGNOSIS — B965 Pseudomonas (aeruginosa) (mallei) (pseudomallei) as the cause of diseases classified elsewhere: Secondary | ICD-10-CM | POA: Diagnosis not present

## 2019-07-27 DIAGNOSIS — I2510 Atherosclerotic heart disease of native coronary artery without angina pectoris: Secondary | ICD-10-CM | POA: Diagnosis not present

## 2019-07-27 DIAGNOSIS — E872 Acidosis: Secondary | ICD-10-CM | POA: Diagnosis not present

## 2019-07-27 DIAGNOSIS — Z7984 Long term (current) use of oral hypoglycemic drugs: Secondary | ICD-10-CM | POA: Diagnosis not present

## 2019-07-27 DIAGNOSIS — I5030 Unspecified diastolic (congestive) heart failure: Secondary | ICD-10-CM | POA: Diagnosis not present

## 2019-07-27 DIAGNOSIS — Z792 Long term (current) use of antibiotics: Secondary | ICD-10-CM | POA: Diagnosis not present

## 2019-07-27 DIAGNOSIS — N1832 Chronic kidney disease, stage 3b: Secondary | ICD-10-CM | POA: Diagnosis not present

## 2019-07-27 DIAGNOSIS — M461 Sacroiliitis, not elsewhere classified: Secondary | ICD-10-CM | POA: Diagnosis not present

## 2019-07-27 DIAGNOSIS — L0231 Cutaneous abscess of buttock: Secondary | ICD-10-CM | POA: Diagnosis not present

## 2019-07-28 NOTE — Progress Notes (Signed)
Internal Medicine Clinic Attending  CCM services provided by the care management provider and their documentation were reviewed with Dr. Basaraba.  We reviewed the pertinent findings, urgent action items addressed by the resident and non-urgent items to be addressed by the PCP.  I agree with the assessment, diagnosis, and plan of care documented in the CCM and resident's note.  Alexander N Raines, MD 07/28/2019  

## 2019-07-28 NOTE — Progress Notes (Signed)
Internal Medicine Clinic Resident  Plan:  Lipid panel and Microalbumin/creatinine ratio ordered Recommend patient and daughter discuss diabetes regimen with Dr. Evette Doffing at upcoming appointment on July 31, 2019.  I have personally reviewed this encounter including the documentation in this note and/or discussed this patient with the care management provider. I will address any urgent items identified by the care management provider and will communicate my actions to the patient's PCP. I have reviewed the patient's CCM visit with my supervising attending, Dr Rebeca Alert.  Jose Persia, MD 07/28/2019

## 2019-07-28 NOTE — Addendum Note (Signed)
Addended by: Jose Persia on: 07/28/2019 03:30 PM   Modules accepted: Orders

## 2019-07-31 ENCOUNTER — Ambulatory Visit (INDEPENDENT_AMBULATORY_CARE_PROVIDER_SITE_OTHER): Payer: Medicare Other | Admitting: Student in an Organized Health Care Education/Training Program

## 2019-07-31 ENCOUNTER — Encounter: Payer: Self-pay | Admitting: Student in an Organized Health Care Education/Training Program

## 2019-07-31 VITALS — BP 130/67 | HR 88 | Temp 98.7°F | Ht 68.0 in | Wt 207.2 lb

## 2019-07-31 DIAGNOSIS — Z794 Long term (current) use of insulin: Secondary | ICD-10-CM

## 2019-07-31 DIAGNOSIS — E1169 Type 2 diabetes mellitus with other specified complication: Secondary | ICD-10-CM

## 2019-07-31 DIAGNOSIS — Z792 Long term (current) use of antibiotics: Secondary | ICD-10-CM | POA: Diagnosis not present

## 2019-07-31 DIAGNOSIS — N1832 Chronic kidney disease, stage 3b: Secondary | ICD-10-CM | POA: Diagnosis not present

## 2019-07-31 DIAGNOSIS — I10 Essential (primary) hypertension: Secondary | ICD-10-CM

## 2019-07-31 DIAGNOSIS — Z8673 Personal history of transient ischemic attack (TIA), and cerebral infarction without residual deficits: Secondary | ICD-10-CM | POA: Diagnosis not present

## 2019-07-31 DIAGNOSIS — Z9981 Dependence on supplemental oxygen: Secondary | ICD-10-CM | POA: Diagnosis not present

## 2019-07-31 DIAGNOSIS — D631 Anemia in chronic kidney disease: Secondary | ICD-10-CM | POA: Diagnosis not present

## 2019-07-31 DIAGNOSIS — E1122 Type 2 diabetes mellitus with diabetic chronic kidney disease: Secondary | ICD-10-CM | POA: Diagnosis not present

## 2019-07-31 DIAGNOSIS — F329 Major depressive disorder, single episode, unspecified: Secondary | ICD-10-CM | POA: Diagnosis not present

## 2019-07-31 DIAGNOSIS — J479 Bronchiectasis, uncomplicated: Secondary | ICD-10-CM | POA: Diagnosis not present

## 2019-07-31 DIAGNOSIS — M353 Polymyalgia rheumatica: Secondary | ICD-10-CM

## 2019-07-31 DIAGNOSIS — E872 Acidosis: Secondary | ICD-10-CM | POA: Diagnosis not present

## 2019-07-31 DIAGNOSIS — K610 Anal abscess: Secondary | ICD-10-CM

## 2019-07-31 DIAGNOSIS — B965 Pseudomonas (aeruginosa) (mallei) (pseudomallei) as the cause of diseases classified elsewhere: Secondary | ICD-10-CM | POA: Diagnosis not present

## 2019-07-31 DIAGNOSIS — D62 Acute posthemorrhagic anemia: Secondary | ICD-10-CM | POA: Diagnosis not present

## 2019-07-31 DIAGNOSIS — I5030 Unspecified diastolic (congestive) heart failure: Secondary | ICD-10-CM | POA: Diagnosis not present

## 2019-07-31 DIAGNOSIS — Z7984 Long term (current) use of oral hypoglycemic drugs: Secondary | ICD-10-CM | POA: Diagnosis not present

## 2019-07-31 DIAGNOSIS — M47816 Spondylosis without myelopathy or radiculopathy, lumbar region: Secondary | ICD-10-CM | POA: Diagnosis not present

## 2019-07-31 DIAGNOSIS — I700 Atherosclerosis of aorta: Secondary | ICD-10-CM | POA: Diagnosis not present

## 2019-07-31 DIAGNOSIS — L0231 Cutaneous abscess of buttock: Secondary | ICD-10-CM | POA: Diagnosis not present

## 2019-07-31 DIAGNOSIS — I130 Hypertensive heart and chronic kidney disease with heart failure and stage 1 through stage 4 chronic kidney disease, or unspecified chronic kidney disease: Secondary | ICD-10-CM | POA: Diagnosis not present

## 2019-07-31 DIAGNOSIS — Z7982 Long term (current) use of aspirin: Secondary | ICD-10-CM | POA: Diagnosis not present

## 2019-07-31 DIAGNOSIS — J9621 Acute and chronic respiratory failure with hypoxia: Secondary | ICD-10-CM | POA: Diagnosis not present

## 2019-07-31 DIAGNOSIS — M461 Sacroiliitis, not elsewhere classified: Secondary | ICD-10-CM | POA: Diagnosis not present

## 2019-07-31 DIAGNOSIS — Z7952 Long term (current) use of systemic steroids: Secondary | ICD-10-CM | POA: Diagnosis not present

## 2019-07-31 DIAGNOSIS — F32A Depression, unspecified: Secondary | ICD-10-CM

## 2019-07-31 DIAGNOSIS — I2510 Atherosclerotic heart disease of native coronary artery without angina pectoris: Secondary | ICD-10-CM | POA: Diagnosis not present

## 2019-07-31 DIAGNOSIS — N179 Acute kidney failure, unspecified: Secondary | ICD-10-CM | POA: Diagnosis not present

## 2019-07-31 DIAGNOSIS — L03317 Cellulitis of buttock: Secondary | ICD-10-CM | POA: Diagnosis not present

## 2019-07-31 DIAGNOSIS — E039 Hypothyroidism, unspecified: Secondary | ICD-10-CM | POA: Diagnosis not present

## 2019-07-31 MED ORDER — PAROXETINE HCL 30 MG PO TABS: 30.0000 mg | ORAL_TABLET | Freq: Every day | ORAL | 3 refills | Status: DC

## 2019-07-31 NOTE — Assessment & Plan Note (Signed)
Blood pressure is at a good range today.  Currently only taking amlodipine 5 mg and carvedilol 25 mg twice daily.  Historically has been on lisinopril 40 mg daily but this was held in the hospital due to a mild acute on chronic kidney injury.  Given blood pressures are looking good today I think it is okay to continue to hold the lisinopril.  May want to restart it in a few months once this wound is healed for ongoing renal protection of her CKD.

## 2019-07-31 NOTE — Assessment & Plan Note (Signed)
Some degree of adjustment disorder on top of underlying depression related to recent illness and hospitalization.  Plan is to restart paroxetine 30 mg daily.

## 2019-07-31 NOTE — Progress Notes (Signed)
   Assessment and Plan:  See Encounters tab for problem-based medical decision making.   __________________________________________________________  HPI:   84 year old person here for follow-up from recent hospitalization.  Diagnosed with polymyalgia rheumatica few months ago and has been treated with prednisone, on a slow taper at this point.  In early June noticed cellulitis at the left buttocks cheek with progressive pain and systemic symptoms.  Hospitalized at [facility] long with purulent skin and soft tissue infection and treated with IV antibiotics.  Required surgical drainage of a large abscess cavity on the left upper cheek which tracked to the anus but did not enter the anal canal and was not an open fistula.  Discharged last Monday back to home, reports doing well at home.  Still mobile, independent in ADLs.  Reports pain is well controlled.  Denies fevers or other systemic symptoms.  Daughter is there to help with wound changes on a daily basis.  Home health wound care ordered, they had 1 visit on Tuesday, planning for once weekly visits.  They followed up with the surgery clinic on Friday, says wound is healing well, follow-up 3 weeks after that.  Finishing antibiotics, last dose is today, has had no adverse side effects.  Eating and drinking well.  Daughter is checking fingerstick glucose once daily in the early morning reports levels ranging from 150 to 240.  Working on nutrition changes right now to try to improve glucose control.  Though she was prescribed short acting insulin at discharge, they have not used any of this medicine.  __________________________________________________________  Problem List: Patient Active Problem List   Diagnosis Date Noted  . High Risk for Falls 07/18/2015    Priority: High  . Depression 01/21/2006    Priority: High  . Essential hypertension 01/21/2006    Priority: High  . Polymyalgia rheumatica ([place]) 01/21/2006    Priority: High  . Type 2  diabetes mellitus with other specified complication ([place]) 65/79/0383    Priority: High  . CKD (chronic kidney disease) stage 3, GFR 30-59 ml/min 05/08/2019    Priority: Medium  . Obstructive sleep apnea 12/25/2011    Priority: Medium  . Hypothyroidism 01/21/2006    Priority: Medium  . COPD ([place]) 01/21/2006    Priority: Medium  . Osteoarthritis 01/21/2006    Priority: Medium  . GERD (gastroesophageal reflux disease) 06/16/2015    Priority: Low  . Urinary, incontinence, stress female 05/09/2015    Priority: Low  . Insomnia 05/09/2015    Priority: Low  . Eczema of hand 06/21/2013    Priority: Low  . Preventive measure 02/17/2013    Priority: Low  . Perianal abscess 07/15/2019    Medications: Reconciled today in Epic __________________________________________________________  Physical Exam:  Vital Signs: Vitals:   07/31/19 0943  BP: 130/67  Pulse: 88  Temp: 98.7 F (37.1 C)  TempSrc: Oral  SpO2: 96%  Weight: 207 lb 3.2 oz (94 kg)    Gen: Well appearing, NAD Skin: Left superior gluteal cheek has a postoperative open wounds that is 6 cm x 3 cm x 1 cm deep. No purulent drainage, normal amount of surrounding erythema, no foul odor.  Medially across the cleft on the right cheek there is a small 1 x 1 cm postoperative wound that has good granulation tissue and no purulent drainage.  Wounds were packed with wet Kerlix and given a clean dressing.

## 2019-07-31 NOTE — Assessment & Plan Note (Signed)
Admitted to [facility] in early June for a perianal abscess.  Predisposing factors include advanced age, diabetes, and prednisone use.  Had surgery on June 17 to evacuate a large abscess cavity in the left upper gluteal cheek.  Though it did track to the anus it did not communicate, not a fistula.  On exam today she has a postoperative wound that is 6 cm long, 3 cm wide, 1 cm deep.  There is a smaller wound on the right cheek but only 1 x 1 cm.  Seems to be healing well, no signs of postoperative infection.  We did some training with her daughter on wet-to-dry gauze dressing changes.  Plan is for twice daily saline soaked gauze packing into the large wound and cover with a clean dry gauze.  She is finishing antibiotics today, I do not see a need to extend that course.  We talked about ways to manage hyperglycemia to improve wound healing.  Also we are tapering down on the prednisone.  She has home wound care visiting once a week, and will follow up with surgery clinic in about 2 weeks.

## 2019-07-31 NOTE — Patient Instructions (Signed)
Is great seeing you today in clinic, I am sorry for everything that you have had to go through.  For your wound, we talked about changing the wet gauze packing in the wound twice daily and keeping a clean covering on it.  Please let us know if you have any questions or difficulties.  For the diabetes, it is important we keep your blood sugars under 200 to help this wound heal.  Please check your fingerstick blood sugar twice a day, best done once in the early morning before you eat and then sometime in the daytime after you eat.  Please keep a log, call me if you are seeing blood sugars over 200.  If that is the case we will need to teach you how to use a short acting insulin just for a few weeks while the wound heals.  For your prednisone dosing, we want to decrease the dose as quickly as possible to avoid side effects, however we do not want the inflammation in your shoulders and hips to return.  You are currently taking 3 tablets once daily, do that for 2 weeks.  Then you can go down to 2-1/2 tablets daily.  Call me if you have return of muscle pain and we can go back up to 3 tablets daily.  The change here is that instead of waiting 28 days in between the prednisone dose changes, we can try to make the change faster in 14 days.

## 2019-07-31 NOTE — Assessment & Plan Note (Signed)
Hemoglobin A1c 9.2% in early June.  This is worse than typical due to ongoing use of prednisone for polymyalgia rheumatica.  Needs better glucose control to promote wound healing from current perianal abscess.  Currently on Metformin 1000 mg twice daily.  Not currently willing to use short acting subcutaneous insulin postprandially, we talked about how this might be necessary if blood sugars continue to be over 200.  Currently they want to make dietary changes.  I advised checking fingerstick glucose at least twice daily, with some postprandial levels.  Call me if blood sugars are elevated above 200 and we can work out a short acting insulin regimen.

## 2019-07-31 NOTE — Assessment & Plan Note (Signed)
Symptomatically doing well but were seeing some adverse reactions to the chronic prednisone use including hyperglycemia, insomnia, and skin and soft tissue infection.  Currently down to the 15 mg daily dose.  I advised we can increase the speed of our prednisone taper, reduce her dose by 2.5 mg every 2 weeks instead of waiting a full 4 weeks.  If she has return of myalgias symptoms can slow down the taper and go back up to the next level.

## 2019-08-01 ENCOUNTER — Other Ambulatory Visit: Payer: Self-pay | Admitting: *Deleted

## 2019-08-01 ENCOUNTER — Telehealth: Payer: Self-pay | Admitting: *Deleted

## 2019-08-01 MED ORDER — OXYCODONE HCL 5 MG PO TABS: 5.0000 mg | ORAL_TABLET | Freq: Four times a day (QID) | ORAL | 0 refills | Status: AC | PRN

## 2019-08-01 NOTE — Telephone Encounter (Signed)
This is an acute pain generator due to a post operative wound, on its way to healing. I can only prescribe 5 day supply per STOP act in this kind of situation. As I told them yesterday in the visit, I anticipate this pain will improve in the coming days as the wound continues to heal.

## 2019-08-01 NOTE — Telephone Encounter (Signed)
Pt's daughter calls and states she needs the oxy 5mg  1 every 6 hours #120 sent to [facility]. She states this is the only way pt can tolerate baths and wound care and being able to reposition.

## 2019-08-03 DIAGNOSIS — Z792 Long term (current) use of antibiotics: Secondary | ICD-10-CM | POA: Diagnosis not present

## 2019-08-03 DIAGNOSIS — E1122 Type 2 diabetes mellitus with diabetic chronic kidney disease: Secondary | ICD-10-CM | POA: Diagnosis not present

## 2019-08-03 DIAGNOSIS — E039 Hypothyroidism, unspecified: Secondary | ICD-10-CM | POA: Diagnosis not present

## 2019-08-03 DIAGNOSIS — J9621 Acute and chronic respiratory failure with hypoxia: Secondary | ICD-10-CM | POA: Diagnosis not present

## 2019-08-03 DIAGNOSIS — M461 Sacroiliitis, not elsewhere classified: Secondary | ICD-10-CM | POA: Diagnosis not present

## 2019-08-03 DIAGNOSIS — Z9981 Dependence on supplemental oxygen: Secondary | ICD-10-CM | POA: Diagnosis not present

## 2019-08-03 DIAGNOSIS — M47816 Spondylosis without myelopathy or radiculopathy, lumbar region: Secondary | ICD-10-CM | POA: Diagnosis not present

## 2019-08-03 DIAGNOSIS — N1832 Chronic kidney disease, stage 3b: Secondary | ICD-10-CM | POA: Diagnosis not present

## 2019-08-03 DIAGNOSIS — J479 Bronchiectasis, uncomplicated: Secondary | ICD-10-CM | POA: Diagnosis not present

## 2019-08-03 DIAGNOSIS — Z7982 Long term (current) use of aspirin: Secondary | ICD-10-CM | POA: Diagnosis not present

## 2019-08-03 DIAGNOSIS — Z7952 Long term (current) use of systemic steroids: Secondary | ICD-10-CM | POA: Diagnosis not present

## 2019-08-03 DIAGNOSIS — D631 Anemia in chronic kidney disease: Secondary | ICD-10-CM | POA: Diagnosis not present

## 2019-08-03 DIAGNOSIS — Z7984 Long term (current) use of oral hypoglycemic drugs: Secondary | ICD-10-CM | POA: Diagnosis not present

## 2019-08-03 DIAGNOSIS — Z8673 Personal history of transient ischemic attack (TIA), and cerebral infarction without residual deficits: Secondary | ICD-10-CM | POA: Diagnosis not present

## 2019-08-03 DIAGNOSIS — B965 Pseudomonas (aeruginosa) (mallei) (pseudomallei) as the cause of diseases classified elsewhere: Secondary | ICD-10-CM | POA: Diagnosis not present

## 2019-08-03 DIAGNOSIS — I700 Atherosclerosis of aorta: Secondary | ICD-10-CM | POA: Diagnosis not present

## 2019-08-03 DIAGNOSIS — L0231 Cutaneous abscess of buttock: Secondary | ICD-10-CM | POA: Diagnosis not present

## 2019-08-03 DIAGNOSIS — I5030 Unspecified diastolic (congestive) heart failure: Secondary | ICD-10-CM | POA: Diagnosis not present

## 2019-08-03 DIAGNOSIS — E872 Acidosis: Secondary | ICD-10-CM | POA: Diagnosis not present

## 2019-08-03 DIAGNOSIS — L03317 Cellulitis of buttock: Secondary | ICD-10-CM | POA: Diagnosis not present

## 2019-08-03 DIAGNOSIS — N179 Acute kidney failure, unspecified: Secondary | ICD-10-CM | POA: Diagnosis not present

## 2019-08-03 DIAGNOSIS — D62 Acute posthemorrhagic anemia: Secondary | ICD-10-CM | POA: Diagnosis not present

## 2019-08-03 DIAGNOSIS — M353 Polymyalgia rheumatica: Secondary | ICD-10-CM | POA: Diagnosis not present

## 2019-08-03 DIAGNOSIS — I2510 Atherosclerotic heart disease of native coronary artery without angina pectoris: Secondary | ICD-10-CM | POA: Diagnosis not present

## 2019-08-03 DIAGNOSIS — I130 Hypertensive heart and chronic kidney disease with heart failure and stage 1 through stage 4 chronic kidney disease, or unspecified chronic kidney disease: Secondary | ICD-10-CM | POA: Diagnosis not present

## 2019-08-03 DIAGNOSIS — G4733 Obstructive sleep apnea (adult) (pediatric): Secondary | ICD-10-CM | POA: Diagnosis not present

## 2019-08-03 DIAGNOSIS — J449 Chronic obstructive pulmonary disease, unspecified: Secondary | ICD-10-CM | POA: Diagnosis not present

## 2019-08-10 ENCOUNTER — Telehealth: Payer: Self-pay | Admitting: Student in an Organized Health Care Education/Training Program

## 2019-08-10 MED ORDER — OXYCODONE HCL 5 MG PO TABS: 5.0000 mg | ORAL_TABLET | Freq: Two times a day (BID) | ORAL | 0 refills | Status: AC | PRN

## 2019-08-10 MED ORDER — IBUPROFEN 600 MG PO TABS: 600.0000 mg | ORAL_TABLET | Freq: Three times a day (TID) | ORAL | 0 refills | Status: DC | PRN

## 2019-08-10 NOTE — Telephone Encounter (Signed)
Ok. Another 5 day course is reasonable while this large wound resolves.

## 2019-08-10 NOTE — Telephone Encounter (Signed)
Patient notified and is very appreciative. L. Grayson Pfefferle, BSN, RN-BC  ° °

## 2019-08-10 NOTE — Telephone Encounter (Signed)
Returned call to patient and daughter. States patient needs refill on ibuprofen 600 mg and oxycodone 5 mg. Read PCP's note from 08/01/2019 to them and patient states she needs one more 5 day round of theses meds as she continues with rectal pain. Will forward to PCP. Hubbard Hartshorn, BSN, RN-BC

## 2019-08-10 NOTE — Telephone Encounter (Signed)
NEED REFILL ON PAIN MEDICINE, acetaminophen (TYLENOL) 325 MG tablet ;PT CONTACT [place] [place] - [address] AT [place]

## 2019-08-11 ENCOUNTER — Telehealth: Payer: Medicare Other

## 2019-08-11 DIAGNOSIS — D631 Anemia in chronic kidney disease: Secondary | ICD-10-CM | POA: Diagnosis not present

## 2019-08-11 DIAGNOSIS — L0231 Cutaneous abscess of buttock: Secondary | ICD-10-CM | POA: Diagnosis not present

## 2019-08-11 DIAGNOSIS — I700 Atherosclerosis of aorta: Secondary | ICD-10-CM | POA: Diagnosis not present

## 2019-08-11 DIAGNOSIS — M353 Polymyalgia rheumatica: Secondary | ICD-10-CM | POA: Diagnosis not present

## 2019-08-11 DIAGNOSIS — E039 Hypothyroidism, unspecified: Secondary | ICD-10-CM | POA: Diagnosis not present

## 2019-08-11 DIAGNOSIS — I5030 Unspecified diastolic (congestive) heart failure: Secondary | ICD-10-CM | POA: Diagnosis not present

## 2019-08-11 DIAGNOSIS — Z7984 Long term (current) use of oral hypoglycemic drugs: Secondary | ICD-10-CM | POA: Diagnosis not present

## 2019-08-11 DIAGNOSIS — E1122 Type 2 diabetes mellitus with diabetic chronic kidney disease: Secondary | ICD-10-CM | POA: Diagnosis not present

## 2019-08-11 DIAGNOSIS — Z7982 Long term (current) use of aspirin: Secondary | ICD-10-CM | POA: Diagnosis not present

## 2019-08-11 DIAGNOSIS — N1832 Chronic kidney disease, stage 3b: Secondary | ICD-10-CM | POA: Diagnosis not present

## 2019-08-11 DIAGNOSIS — M47816 Spondylosis without myelopathy or radiculopathy, lumbar region: Secondary | ICD-10-CM | POA: Diagnosis not present

## 2019-08-11 DIAGNOSIS — J9621 Acute and chronic respiratory failure with hypoxia: Secondary | ICD-10-CM | POA: Diagnosis not present

## 2019-08-11 DIAGNOSIS — Z8673 Personal history of transient ischemic attack (TIA), and cerebral infarction without residual deficits: Secondary | ICD-10-CM | POA: Diagnosis not present

## 2019-08-11 DIAGNOSIS — J479 Bronchiectasis, uncomplicated: Secondary | ICD-10-CM | POA: Diagnosis not present

## 2019-08-11 DIAGNOSIS — Z7952 Long term (current) use of systemic steroids: Secondary | ICD-10-CM | POA: Diagnosis not present

## 2019-08-11 DIAGNOSIS — M461 Sacroiliitis, not elsewhere classified: Secondary | ICD-10-CM | POA: Diagnosis not present

## 2019-08-11 DIAGNOSIS — Z9981 Dependence on supplemental oxygen: Secondary | ICD-10-CM | POA: Diagnosis not present

## 2019-08-11 DIAGNOSIS — I2510 Atherosclerotic heart disease of native coronary artery without angina pectoris: Secondary | ICD-10-CM | POA: Diagnosis not present

## 2019-08-11 DIAGNOSIS — B965 Pseudomonas (aeruginosa) (mallei) (pseudomallei) as the cause of diseases classified elsewhere: Secondary | ICD-10-CM | POA: Diagnosis not present

## 2019-08-11 DIAGNOSIS — E872 Acidosis: Secondary | ICD-10-CM | POA: Diagnosis not present

## 2019-08-11 DIAGNOSIS — L03317 Cellulitis of buttock: Secondary | ICD-10-CM | POA: Diagnosis not present

## 2019-08-11 DIAGNOSIS — D62 Acute posthemorrhagic anemia: Secondary | ICD-10-CM | POA: Diagnosis not present

## 2019-08-11 DIAGNOSIS — I130 Hypertensive heart and chronic kidney disease with heart failure and stage 1 through stage 4 chronic kidney disease, or unspecified chronic kidney disease: Secondary | ICD-10-CM | POA: Diagnosis not present

## 2019-08-11 DIAGNOSIS — Z792 Long term (current) use of antibiotics: Secondary | ICD-10-CM | POA: Diagnosis not present

## 2019-08-11 DIAGNOSIS — N179 Acute kidney failure, unspecified: Secondary | ICD-10-CM | POA: Diagnosis not present

## 2019-08-14 ENCOUNTER — Ambulatory Visit: Payer: Self-pay | Admitting: *Deleted

## 2019-08-14 ENCOUNTER — Telehealth: Payer: Medicare Other

## 2019-08-14 ENCOUNTER — Encounter: Payer: Medicare Other | Admitting: Student in an Organized Health Care Education/Training Program

## 2019-08-14 NOTE — Chronic Care Management (AMB) (Signed)
  Chronic Care Management   Outreach Note  08/14/2019 Name: Caitlyn Aguirre MRN: [id number] DOB: 10/08/1935  Referred by: Axel Filler, MD Reason for referral : Chronic Care Management (DM, HTN, COPD, CKD, HF)   An unsuccessful telephone outreach was attempted today. The patient was referred to the case management team for assistance with care management and care coordination.   Follow Up Plan: Unable to leave message on patient's contact number as recording states voice mail box is full. A HIPPA compliant phone message was left for the patient's daughter Caitlyn Aguirre providing contact information and requesting a return call.  If no return call, the care management team will reach out to the patient again over the next 7-14 days.   Kelli Churn RN, CCM, [facility] RN Care Manager [phone]

## 2019-08-18 ENCOUNTER — Telehealth: Payer: Self-pay | Admitting: *Deleted

## 2019-08-18 NOTE — Telephone Encounter (Signed)
Pt's daughter calls and states she is caregiver. She states a doctor took her off of lisinopril and chlorthalidone while in hospital and at [place], she states possibly last week she started pt back on these meds because she felt like after doing some research pt shouldn't have been taken off the meds but now they are not catching up enough to help edema Ask pt to come in for appt this pm but she states they are out of town, appt set for mon 7/19 pm BLUE TEAM

## 2019-08-21 ENCOUNTER — Telehealth: Payer: Medicare Other

## 2019-08-21 ENCOUNTER — Other Ambulatory Visit: Payer: Self-pay

## 2019-08-21 ENCOUNTER — Ambulatory Visit (INDEPENDENT_AMBULATORY_CARE_PROVIDER_SITE_OTHER): Payer: Medicare Other | Admitting: Internal Medicine

## 2019-08-21 ENCOUNTER — Ambulatory Visit: Payer: Self-pay | Admitting: *Deleted

## 2019-08-21 ENCOUNTER — Encounter: Payer: Self-pay | Admitting: Internal Medicine

## 2019-08-21 DIAGNOSIS — M353 Polymyalgia rheumatica: Secondary | ICD-10-CM | POA: Diagnosis not present

## 2019-08-21 DIAGNOSIS — K610 Anal abscess: Secondary | ICD-10-CM | POA: Diagnosis not present

## 2019-08-21 MED ORDER — ALBUTEROL SULFATE (2.5 MG/3ML) 0.083% IN NEBU: 2.5000 mg | INHALATION_SOLUTION | RESPIRATORY_TRACT | 2 refills | Status: DC | PRN

## 2019-08-21 MED ORDER — OXYCODONE HCL 5 MG PO CAPS: 5.0000 mg | ORAL_CAPSULE | Freq: Four times a day (QID) | ORAL | 0 refills | Status: AC | PRN

## 2019-08-21 MED ORDER — IBUPROFEN 600 MG PO TABS: 600.0000 mg | ORAL_TABLET | Freq: Three times a day (TID) | ORAL | 0 refills | Status: DC | PRN

## 2019-08-21 NOTE — Patient Instructions (Signed)
Ms. Miggins, Today we discussed your leg swelling which is another side effect of the Prednisone. You can continue taking your blood pressure medication you had previously been on if you are not having any side effects such as dizziness with standing.  The swelling should improve as you come down on your steroid dose.   I have sent in refills for pain medication to help with dressing changes for your wound as requested.   Take care, and follow-up with Dr. Evette Doffing as scheduled.

## 2019-08-21 NOTE — Chronic Care Management (AMB) (Signed)
  Chronic Care Management   Outreach Note  08/21/2019 Name: Caitlyn Aguirre MRN: [id number] DOB: 08-Nov-1935  Referred by: Axel Filler, MD Reason for referral : Chronic Care Management (DM, HTN, COPD, CKD, HF)   A second unsuccessful telephone outreach was attempted today. The patient was referred to the case management team for assistance with care management and care coordination.   Follow Up Plan: The care management team will reach out to the patient again over the next 7-14 days.   Kelli Churn RN, CCM, [facility] RN Care Manager [phone]

## 2019-08-21 NOTE — Telephone Encounter (Signed)
I agree with follow up today. Thank you

## 2019-08-22 ENCOUNTER — Encounter: Payer: Self-pay | Admitting: Internal Medicine

## 2019-08-22 DIAGNOSIS — D631 Anemia in chronic kidney disease: Secondary | ICD-10-CM | POA: Diagnosis not present

## 2019-08-22 DIAGNOSIS — L0231 Cutaneous abscess of buttock: Secondary | ICD-10-CM | POA: Diagnosis not present

## 2019-08-22 DIAGNOSIS — M461 Sacroiliitis, not elsewhere classified: Secondary | ICD-10-CM | POA: Diagnosis not present

## 2019-08-22 DIAGNOSIS — L03317 Cellulitis of buttock: Secondary | ICD-10-CM | POA: Diagnosis not present

## 2019-08-22 DIAGNOSIS — Z7984 Long term (current) use of oral hypoglycemic drugs: Secondary | ICD-10-CM | POA: Diagnosis not present

## 2019-08-22 DIAGNOSIS — D62 Acute posthemorrhagic anemia: Secondary | ICD-10-CM | POA: Diagnosis not present

## 2019-08-22 DIAGNOSIS — J479 Bronchiectasis, uncomplicated: Secondary | ICD-10-CM | POA: Diagnosis not present

## 2019-08-22 DIAGNOSIS — Z7982 Long term (current) use of aspirin: Secondary | ICD-10-CM | POA: Diagnosis not present

## 2019-08-22 DIAGNOSIS — Z8673 Personal history of transient ischemic attack (TIA), and cerebral infarction without residual deficits: Secondary | ICD-10-CM | POA: Diagnosis not present

## 2019-08-22 DIAGNOSIS — N1832 Chronic kidney disease, stage 3b: Secondary | ICD-10-CM | POA: Diagnosis not present

## 2019-08-22 DIAGNOSIS — E1122 Type 2 diabetes mellitus with diabetic chronic kidney disease: Secondary | ICD-10-CM | POA: Diagnosis not present

## 2019-08-22 DIAGNOSIS — E872 Acidosis: Secondary | ICD-10-CM | POA: Diagnosis not present

## 2019-08-22 DIAGNOSIS — I700 Atherosclerosis of aorta: Secondary | ICD-10-CM | POA: Diagnosis not present

## 2019-08-22 DIAGNOSIS — Z7952 Long term (current) use of systemic steroids: Secondary | ICD-10-CM | POA: Diagnosis not present

## 2019-08-22 DIAGNOSIS — M47816 Spondylosis without myelopathy or radiculopathy, lumbar region: Secondary | ICD-10-CM | POA: Diagnosis not present

## 2019-08-22 DIAGNOSIS — J9621 Acute and chronic respiratory failure with hypoxia: Secondary | ICD-10-CM | POA: Diagnosis not present

## 2019-08-22 DIAGNOSIS — Z9981 Dependence on supplemental oxygen: Secondary | ICD-10-CM | POA: Diagnosis not present

## 2019-08-22 DIAGNOSIS — Z792 Long term (current) use of antibiotics: Secondary | ICD-10-CM | POA: Diagnosis not present

## 2019-08-22 DIAGNOSIS — I2510 Atherosclerotic heart disease of native coronary artery without angina pectoris: Secondary | ICD-10-CM | POA: Diagnosis not present

## 2019-08-22 DIAGNOSIS — I130 Hypertensive heart and chronic kidney disease with heart failure and stage 1 through stage 4 chronic kidney disease, or unspecified chronic kidney disease: Secondary | ICD-10-CM | POA: Diagnosis not present

## 2019-08-22 DIAGNOSIS — N179 Acute kidney failure, unspecified: Secondary | ICD-10-CM | POA: Diagnosis not present

## 2019-08-22 DIAGNOSIS — I5030 Unspecified diastolic (congestive) heart failure: Secondary | ICD-10-CM | POA: Diagnosis not present

## 2019-08-22 DIAGNOSIS — E039 Hypothyroidism, unspecified: Secondary | ICD-10-CM | POA: Diagnosis not present

## 2019-08-22 DIAGNOSIS — M353 Polymyalgia rheumatica: Secondary | ICD-10-CM | POA: Diagnosis not present

## 2019-08-22 DIAGNOSIS — B965 Pseudomonas (aeruginosa) (mallei) (pseudomallei) as the cause of diseases classified elsewhere: Secondary | ICD-10-CM | POA: Diagnosis not present
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
## 2019-08-22 NOTE — Assessment & Plan Note (Addendum)
Presenting today due to concern for lower extremity edema which is likely due to side effect of fluid retention from Prednisone. Her daughter reports this has improved since resuming Chlorthalidone that previously was held at discharge due to AKI. She is tolerating the medication well without symptoms of orthostatic hypotension. I discussed that this side effect should resolve as she continues on prescribed Prednisone taper. Daughter notes she is now down to 10 mg as of today and has tolerated decreased dose well thus far.

## 2019-08-22 NOTE — Assessment & Plan Note (Addendum)
Wound care seems to be going well at home, but she requests refill on pain medication to help with twice daily dressing changes. Will prescribe another 5 day course to continue managing pain while large wound resolves.

## 2019-08-28 ENCOUNTER — Telehealth: Payer: Medicare Other

## 2019-08-28 ENCOUNTER — Telehealth: Payer: Self-pay | Admitting: *Deleted

## 2019-08-28 NOTE — Telephone Encounter (Signed)
  Chronic Care Management   Outreach Note  08/28/2019 Name: Caitlyn Aguirre MRN: [id number] DOB: 06/26/1935  Referred by: Axel Filler, MD Reason for referral : Chronic Care Management (HTN, DIDDM, COPD, CKD, HF)   A second unsuccessful telephone outreach was attempted today. The patient was referred to the case management team for assistance with care management and care coordination. Not able to leave message as recording stated "not able to complete your call at this time, please try again later."  Follow Up Plan: The care management team will reach out to the patient again over the next 7-14 days.   Kelli Churn RN, CCM, [facility] RN Care Manager [phone]

## 2019-08-29 NOTE — Progress Notes (Signed)
Internal Medicine Clinic Attending  Case discussed with Dr. [place]  At the time of the visit.  We reviewed the resident's history and exam and pertinent patient test results.  I agree with the assessment, diagnosis, and plan of care documented in the resident's note.  

## 2019-09-03 DIAGNOSIS — G4733 Obstructive sleep apnea (adult) (pediatric): Secondary | ICD-10-CM | POA: Diagnosis not present

## 2019-09-03 DIAGNOSIS — J449 Chronic obstructive pulmonary disease, unspecified: Secondary | ICD-10-CM | POA: Diagnosis not present

## 2019-09-04 ENCOUNTER — Ambulatory Visit: Payer: Medicare Other | Admitting: *Deleted

## 2019-09-04 DIAGNOSIS — N1832 Chronic kidney disease, stage 3b: Secondary | ICD-10-CM

## 2019-09-04 DIAGNOSIS — I10 Essential (primary) hypertension: Secondary | ICD-10-CM

## 2019-09-04 DIAGNOSIS — E1169 Type 2 diabetes mellitus with other specified complication: Secondary | ICD-10-CM

## 2019-09-04 DIAGNOSIS — K610 Anal abscess: Secondary | ICD-10-CM

## 2019-09-04 DIAGNOSIS — M353 Polymyalgia rheumatica: Secondary | ICD-10-CM

## 2019-09-04 NOTE — Patient Instructions (Signed)
Visit Information It was nice speaking with you today. You can take three 200 mg Ibuprofen tablets to treat you wound pain. A care guide will be calling you to help you apply for Medicaid.  Goals Addressed              This Visit's Progress     Patient Stated   .  " I need a refill on my red inhaler." (pt-stated)        CARE PLAN ENTRY (see longitudinal plan of care for additional care plan information)  Current Barriers:  Marland Kitchen Knowledge deficit related to basic understanding of how to use inhalers and how inhaled medications work- patient states she no longer gets refills on her "red inhaler" and she doesn't know why   Case Manager Clinical Goal(s):  Over the next 30-60 days, patient will be able to verbalize understanding of COPD action plan and when to seek appropriate levels of medical care   Interventions:   Provided patient with basic verbal COPD education on self care/management/and exacerbation prevention   Reviewed medications and assessed medication taking behavior  Provided instruction about proper use of medications used for management of COPD including inhalers  Discussed difference between maintenance (Spiriva)and rescue medications (albuterol inhaler and nebulizer)  Messaged provider that patient needs refills on albuterol inhaler  Patient Self Care Activities:  . Self administers medications as prescribed . Attends all scheduled provider appointments . Calls pharmacy for medication refills . Calls provider office for new concerns or questions . Unable to independently obtain refills on albuterol inhaler  Initial goal documentation     .  "I don't think I've ever applied for Medicaid" (pt-stated)        CARE PLAN ENTRY (see longitudinal plan of care for additional care plan information)  Current Barriers:  . Financial Constraints. - patient states she sometimes borrows money from younger friends to help cover the cost of her medication copays, she says  she doesn't think she has ever applied for Medicaid and would like to proceed with applying  Nurse Case Manager Clinical Goal(s):  Marland Kitchen Over the next 30-60 days, patient will work with care guide to apply for Medicaid  Interventions:  . Inter-disciplinary care team collaboration (see longitudinal plan of care) . Care Guide referral for assistance with applying for Medicaid  Patient Self Care Activities:  . Self administers medications as prescribed . Attends all scheduled provider appointments . Calls pharmacy for medication refills . Calls provider office for new concerns or questions . Unable to independently apply for Medicaid  Initial goal documentation     .  "The sore on my bottom is getting better but it still really hurts at times" (pt-stated)        [place] (see longitudinal plan of care for additional care plan information)  Current Barriers:  Marland Kitchen Knowledge Deficits related to strategies to promote wound healing - patient says she is no longer receiving home health services for wound care, her daughter Fraser Din changes the dressing on her buttock once daily, she says the providers would not refill the oxycodone so she treats the wound pain with Ibuprofen, she says she is not checking her blood sugar on a daily basis, she asked for clarification on the prednisone taper instructions  Nurse Case Manager Clinical Goal(s):  Marland Kitchen Over the next 30-60 days, patient will verbalize understanding of plan to implement strategies to promote wound healing . Over the next 30-60 days, patient will take her medications as  prescribed and voice adequate relief from wound pain . Over the next 30-60 days, patient's buttock wound will heal completely  Interventions:  . Inter-disciplinary care team collaboration (see longitudinal plan of care) . Reviewed basic wound healing strategies . Reviewed medications with patient and discussed taking 600 mg of Ibuprofen to treat wound pain  and reviewed  prednisone taper instructions  Patient Self Care Activities:  . Patient verbalizes understanding of plan to treat wound pain with 600 mg of Ibuprofen . Self administers medications as prescribed- and completed prednisone taper correctly . Does not adhere to prescribed medication regimen  Initial goal documentation       Other   .  Blood Pressure < 140/90        BP Readings from Last 3 Encounters:  08/21/19 112/61  07/31/19 130/67  07/24/19 (!) 115/48   Meeting blood pressure targets Voices good medication taking behavior    .  HEMOGLOBIN A1C < 8        Lab Results  Component Value Date   HGBA1C 9.2 (H) 07/16/2019   Not  Meeting A1C target with significant increase in Hgb A1C from 7.1% on 05/08/19, most likely due to Prednisone Rx    .  LDL CALC < 100        Lab Results  Component Value Date   CHOL 188 11/29/2017   HDL 57 11/29/2017   LDLCALC 108 (H) 11/29/2017   TRIG 113 11/29/2017   CHOLHDL 3.3 11/29/2017    Needs updated lipid panel- on statin therapy       The patient verbalized understanding of instructions provided today and declined a print copy of patient instruction materials.   The care management team will reach out to the patient again over the next 30-60 days.   Kelli Churn RN, CCM, [facility] RN Care Manager [phone]

## 2019-09-04 NOTE — Addendum Note (Signed)
Addended by: Barrington Ellison on: 09/04/2019 12:07 PM   Modules accepted: Orders

## 2019-09-04 NOTE — Chronic Care Management (AMB) (Signed)
Chronic Care Management   Follow Up Note   09/04/2019 Name: Caitlyn Aguirre MRN: [id number] DOB: 10-28-1935  Referred by: Axel Filler, MD Reason for referral : Chronic Care Management (COPD, HTN, DM)   Caitlyn Aguirre is a 84 y.o. year old female who is a primary care patient of Axel Filler, MD. The CCM team was consulted for assistance with chronic disease management and care coordination needs.    Review of patient status, including review of consultants reports, relevant laboratory and other test results, and collaboration with appropriate care team members and the patient's provider was performed as part of comprehensive patient evaluation and provision of chronic care management services.    SDOH (Social Determinants of Health) assessments performed: Yes See Care Plan activities for detailed interventions related to SDOH)  SDOH Interventions     Most Recent Value  SDOH Interventions  Financial Strain Interventions Other (Comment)  [referral to care guide to assist patient with applying for [place] Interventions Intervention Not Indicated       Outpatient Encounter Medications as of 09/04/2019  Medication Sig Note  . Accu-Chek Softclix Lancets lancets Check 1 time a day as instructed   . acetaminophen (TYLENOL) 325 MG tablet Take 2 tablets (650 mg total) by mouth every 6 (six) hours as needed for mild pain (or Fever >/= 101).   Marland Kitchen albuterol (PROVENTIL) (2.5 MG/3ML) 0.083% nebulizer solution Take 3 mLs (2.5 mg total) by nebulization every 4 (four) hours as needed for wheezing or shortness of breath. 09/04/2019: Has not needed her nebulizer Rx in "quite a while"  . albuterol (VENTOLIN HFA) 108 (90 Base) MCG/ACT inhaler Inhale 1-2 puffs into the lungs every 6 (six) hours as needed for wheezing or shortness of breath. 09/04/2019: States she needs a refill faxed to her medication mail order company  . amLODipine (NORVASC) 5 MG tablet Take 1 tablet (5 mg total) by  mouth daily.   Marland Kitchen aspirin EC 81 MG tablet Take 1 tablet (81 mg total) by mouth daily.   Marland Kitchen atorvastatin (LIPITOR) 40 MG tablet Take 1 tablet (40 mg total) by mouth daily.   . Blood Glucose Monitoring Suppl (ACCU-CHEK GUIDE ME) w/Device KIT Check 1 times a day as instructed 09/04/2019: Checks her blood sugar prn  . carvedilol (COREG) 25 MG tablet TAKE 1 TABLET BY MOUTH  TWICE DAILY WITH A MEAL   . glucose blood (ACCU-CHEK GUIDE) test strip Check blood sugar 1 time per day   . ibuprofen (ADVIL) 600 MG tablet Take 1 tablet (600 mg total) by mouth every 8 (eight) hours as needed for moderate pain.   Marland Kitchen latanoprost (XALATAN) 0.005 % ophthalmic solution Place 1 drop into both eyes at bedtime.   Marland Kitchen levothyroxine (SYNTHROID) 88 MCG tablet TAKE 1 TABLET BY MOUTH  DAILY BEFORE BREAKFAST   . metFORMIN (GLUCOPHAGE) 1000 MG tablet TAKE 1 TABLET BY MOUTH  TWICE DAILY WITH A MEAL   . omeprazole (PRILOSEC) 20 MG capsule TAKE 1 CAPSULE BY MOUTH  DAILY   . OVER THE COUNTER MEDICATION Take 1 capsule by mouth as needed (heartburn and gas). Heartburn and gas chews 735m calcium carbonate and 876msimethicone   . OXYGEN Inhale 2 L into the lungs daily as needed (for breathing).  09/04/2019: States she uses at night  . PARoxetine (PAXIL) 30 MG tablet Take 1 tablet (30 mg total) by mouth daily.   . predniSONE (DELTASONE) 5 MG tablet Take 3.5 tablets (17.5 mg total) by mouth  daily with breakfast for 28 days, THEN 3 tablets (15 mg total) daily with breakfast for 28 days, THEN 2.5 tablets (12.5 mg total) daily with breakfast for 28 days, THEN 2 tablets (10 mg total) daily with breakfast for 28 days. 07/15/2019: Patient's daughter called back after confirming the medication bottle at home, she is taking 5 mg once daily.  . ramelteon (ROZEREM) 8 MG tablet Take 1 tablet (8 mg total) by mouth at bedtime. 07/15/2019: Lf 06/14/19 for 30 days  . SPIRIVA HANDIHALER 18 MCG inhalation capsule INHALE THE CONTENTS OF 1  CAPSULE BY MOUTH VIA   HANDIHALER DAILY (Patient taking differently: Place 18 mcg into inhaler and inhale daily as needed (for wheezing or shortness of breath.). ) 09/04/2019: Reviewed with patient that this medicine is to be used daily not prn   No facility-administered encounter medications on file as of 09/04/2019.     Objective:  Wt Readings from Last 3 Encounters:  08/21/19 200 lb 3.2 oz (90.8 kg)  07/31/19 207 lb 3.2 oz (94 kg)  07/15/19 206 lb (93.4 kg)    Goals Addressed              This Visit's Progress     Patient Stated   .  " I need a refill on my red inhaler." (pt-stated)        CARE PLAN ENTRY (see longitudinal plan of care for additional care plan information)  Current Barriers:  Marland Kitchen Knowledge deficit related to basic understanding of how to use inhalers and how inhaled medications work- patient states she no longer gets refills on her "red inhaler" and she doesn't know why   Case Manager Clinical Goal(s):  Over the next 30-60 days, patient will be able to verbalize understanding of COPD action plan and when to seek appropriate levels of medical care   Interventions:   Provided patient with basic verbal COPD education on self care/management/and exacerbation prevention   Reviewed medications and assessed medication taking behavior  Provided instruction about proper use of medications used for management of COPD including inhalers  Discussed difference between maintenance (Spiriva)and rescue medications (albuterol inhaler and nebulizer)  Messaged provider that patient needs refills on albuterol inhaler  Patient Self Care Activities:  . Self administers medications as prescribed . Attends all scheduled provider appointments . Calls pharmacy for medication refills . Calls provider office for new concerns or questions . Unable to independently obtain refills on albuterol inhaler  Initial goal documentation     .  "I don't think I've ever applied for Medicaid" (pt-stated)         CARE PLAN ENTRY (see longitudinal plan of care for additional care plan information)  Current Barriers:  . Financial Constraints. - patient states she sometimes borrows money from younger friends to help cover the cost of her medication copays, she says she doesn't think she has ever applied for Medicaid and would like to proceed with applying  Nurse Case Manager Clinical Goal(s):  Marland Kitchen Over the next 30-60 days, patient will work with care guide to apply for Medicaid  Interventions:  . Inter-disciplinary care team collaboration (see longitudinal plan of care) . Care Guide referral for assistance with applying for Medicaid  Patient Self Care Activities:  . Self administers medications as prescribed . Attends all scheduled provider appointments . Calls pharmacy for medication refills . Calls provider office for new concerns or questions . Unable to independently apply for Medicaid  Initial goal documentation     .  "  The sore on my bottom is getting better but it still really hurts at times" (pt-stated)        [place] (see longitudinal plan of care for additional care plan information)  Current Barriers:  Marland Kitchen Knowledge Deficits related to strategies to promote wound healing - patient says she is no longer receiving home health services for wound care, her daughter Fraser Din changes the dressing on her buttock once daily, she says the providers would not refill the oxycodone so she treats the wound pain with Ibuprofen, she says she is not checking her blood sugar on a daily basis, she asked for clarification on the prednisone taper instructions  Nurse Case Manager Clinical Goal(s):  Marland Kitchen Over the next 30-60 days, patient will verbalize understanding of plan to implement strategies to promote wound healing . Over the next 30-60 days, patient will take her medications as prescribed and voice adequate relief from wound pain . Over the next 30-60 days, patient's buttock wound will heal  completely  Interventions:  . Inter-disciplinary care team collaboration (see longitudinal plan of care) . Reviewed basic wound healing strategies . Reviewed medications with patient and discussed taking 600 mg of Ibuprofen to treat wound pain  and reviewed prednisone taper instructions  Patient Self Care Activities:  . Patient verbalizes understanding of plan to treat wound pain with 600 mg of Ibuprofen . Self administers medications as prescribed- and completed prednisone taper correctly . Does not adhere to prescribed medication regimen  Initial goal documentation       Other   .  Blood Pressure < 140/90        BP Readings from Last 3 Encounters:  08/21/19 112/61  07/31/19 130/67  07/24/19 (!) 115/48   Meeting blood pressure targets Voices good medication taking behavior    .  HEMOGLOBIN A1C < 8        Lab Results  Component Value Date   HGBA1C 9.2 (H) 07/16/2019   Not  Meeting A1C target with significant increase in Hgb A1C from 7.1% on 05/08/19, most likely due to Prednisone Rx    .  LDL CALC < 100        Lab Results  Component Value Date   CHOL 188 11/29/2017   HDL 57 11/29/2017   LDLCALC 108 (H) 11/29/2017   TRIG 113 11/29/2017   CHOLHDL 3.3 11/29/2017    Needs updated lipid panel- on statin therapy        Plan:   The care management team will reach out to the patient again over the next 30-60 days.    Kelli Churn RN, CCM, [facility] RN Care Manager [phone]

## 2019-09-06 NOTE — Addendum Note (Signed)
Addended by: Truddie Crumble on: 09/06/2019 03:29 PM   Modules accepted: Orders

## 2019-09-08 ENCOUNTER — Other Ambulatory Visit: Payer: Self-pay | Admitting: Internal Medicine

## 2019-09-08 MED ORDER — ALBUTEROL SULFATE (2.5 MG/3ML) 0.083% IN NEBU: 2.5000 mg | INHALATION_SOLUTION | RESPIRATORY_TRACT | 2 refills | Status: DC | PRN

## 2019-09-08 MED ORDER — ALBUTEROL SULFATE HFA 108 (90 BASE) MCG/ACT IN AERS: 1.0000 | INHALATION_SPRAY | Freq: Four times a day (QID) | RESPIRATORY_TRACT | 3 refills | Status: DC | PRN

## 2019-09-08 NOTE — Addendum Note (Signed)
Addended by: Asencion Noble on: 09/08/2019 11:01 AM   Modules accepted: Orders

## 2019-09-08 NOTE — Addendum Note (Signed)
Addended by: Asencion Noble on: 09/08/2019 11:03 AM   Modules accepted: Orders

## 2019-09-08 NOTE — Progress Notes (Signed)
Internal Medicine Clinic Resident  I have personally reviewed this encounter including the documentation in this note and/or discussed this patient with the care management provider. I will address any urgent items identified by the care management provider and will communicate my actions to the patient's PCP. Refilled albuterol inhaler. Ordered future lab for lipid panel. I have reviewed the patient's CCM visit with my supervising attending, Dr Philipp Ovens.  Asencion Noble, MD 09/08/2019

## 2019-09-13 NOTE — Progress Notes (Signed)
Internal Medicine Clinic Attending  CCM services provided by the care management provider and their documentation were discussed with Dr. Sherry Ruffing. We reviewed the pertinent findings, urgent action items addressed by the resident and non-urgent items to be addressed by the PCP.  I agree with the assessment, diagnosis, and plan of care documented in the CCM and resident's note.  Velna Ochs, MD 09/13/2019

## 2019-09-22 ENCOUNTER — Other Ambulatory Visit: Payer: Self-pay | Admitting: *Deleted

## 2019-09-22 MED ORDER — ALBUTEROL SULFATE HFA 108 (90 BASE) MCG/ACT IN AERS: 1.0000 | INHALATION_SPRAY | Freq: Four times a day (QID) | RESPIRATORY_TRACT | 3 refills | Status: DC | PRN

## 2019-09-28 ENCOUNTER — Telehealth: Payer: Self-pay | Admitting: *Deleted

## 2019-09-28 NOTE — Telephone Encounter (Signed)
done

## 2019-10-02 ENCOUNTER — Telehealth: Payer: Self-pay | Admitting: Student in an Organized Health Care Education/Training Program

## 2019-10-02 ENCOUNTER — Encounter: Payer: Self-pay | Admitting: Internal Medicine

## 2019-10-02 ENCOUNTER — Ambulatory Visit (INDEPENDENT_AMBULATORY_CARE_PROVIDER_SITE_OTHER): Payer: Medicare Other | Admitting: Internal Medicine

## 2019-10-02 ENCOUNTER — Other Ambulatory Visit: Payer: Self-pay

## 2019-10-02 VITALS — BP 99/46 | HR 72 | Temp 98.6°F | Wt 196.6 lb

## 2019-10-02 DIAGNOSIS — K610 Anal abscess: Secondary | ICD-10-CM

## 2019-10-02 MED ORDER — DOXYCYCLINE MONOHYDRATE 100 MG PO TABS: 100.0000 mg | ORAL_TABLET | Freq: Two times a day (BID) | ORAL | 0 refills | Status: DC

## 2019-10-02 MED ORDER — IBUPROFEN 600 MG PO TABS: 600.0000 mg | ORAL_TABLET | Freq: Three times a day (TID) | ORAL | 0 refills | Status: DC | PRN

## 2019-10-02 MED ORDER — OXYCODONE HCL 5 MG PO TABS: 5.0000 mg | ORAL_TABLET | Freq: Four times a day (QID) | ORAL | 0 refills | Status: AC | PRN

## 2019-10-02 MED ORDER — IBUPROFEN 200 MG PO TABS
600.0000 mg | ORAL_TABLET | Freq: Once | ORAL | Status: AC
  Administered 2019-10-02: 600 mg via ORAL

## 2019-10-02 NOTE — Telephone Encounter (Signed)
Pls contact daughter [phone], pt is hurting very bad, pt is wanting to come in and patient has another boil on her, pls pt ASAP
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
## 2019-10-02 NOTE — Telephone Encounter (Signed)
Spoke with pt's dtr-states mom is feeling bad since stopping prednisone and has another "boil" that has "popped up". CMA briefly discussed with pcp-pt to be seen this afternoon in [facility].[name]8/30/20219:44 AM

## 2019-10-02 NOTE — Patient Instructions (Addendum)
Thank you for trusting me with your care. To recap, today we discussed the following:   Perianal abscess  1. Perianal abscess Follow up with your surgeon tomorrow. Dr.Vincent would like to have a telephone visit in 2 weeks to follow up on this abscess.  - doxycycline (ADOXA) 100 MG tablet; Take 1 tablet (100 mg total) by mouth 2 (two) times daily.  Dispense: 14 tablet; Refill: 0 - BMP8+Anion Gap - CBC with Diff - ibuprofen (ADVIL) 600 MG tablet; Take 1 tablet (600 mg total) by mouth every 8 (eight) hours as needed for moderate pain.  Dispense: 15 tablet; Refill: 0 - oxyCODONE (ROXICODONE) 5 MG immediate release tablet; Take 1 tablet (5 mg total) by mouth every 6 (six) hours as needed for up to 7 days for severe pain.  Dispense: 20 tablet; Refill: 0

## 2019-10-03 ENCOUNTER — Encounter: Payer: Self-pay | Admitting: Internal Medicine

## 2019-10-03 DIAGNOSIS — K610 Anal abscess: Secondary | ICD-10-CM | POA: Diagnosis not present

## 2019-10-03 LAB — CBC WITH DIFFERENTIAL/PLATELET
Basophils Absolute: 0 10*3/uL (ref 0.0–0.2)
Basos: 0 %
EOS (ABSOLUTE): 0.3 10*3/uL (ref 0.0–0.4)
Eos: 2 %
Hematocrit: 33.4 % — ABNORMAL LOW (ref 34.0–46.6)
Hemoglobin: 10.7 g/dL — ABNORMAL LOW (ref 11.1–15.9)
Immature Grans (Abs): 0 10*3/uL (ref 0.0–0.1)
Immature Granulocytes: 0 %
Lymphocytes Absolute: 2 10*3/uL (ref 0.7–3.1)
Lymphs: 16 %
MCH: 28.3 pg (ref 26.6–33.0)
MCHC: 32 g/dL (ref 31.5–35.7)
MCV: 88 fL (ref 79–97)
Monocytes Absolute: 1.1 10*3/uL — ABNORMAL HIGH (ref 0.1–0.9)
Monocytes: 8 %
Neutrophils Absolute: 9.4 10*3/uL — ABNORMAL HIGH (ref 1.4–7.0)
Neutrophils: 74 %
Platelets: 248 10*3/uL (ref 150–450)
RBC: 3.78 x10E6/uL (ref 3.77–5.28)
RDW: 15.7 % — ABNORMAL HIGH (ref 11.7–15.4)
WBC: 12.9 10*3/uL — ABNORMAL HIGH (ref 3.4–10.8)

## 2019-10-03 LAB — BMP8+ANION GAP
Anion Gap: 15 mmol/L (ref 10.0–18.0)
BUN/Creatinine Ratio: 14 (ref 12–28)
BUN: 17 mg/dL (ref 8–27)
CO2: 18 mmol/L — ABNORMAL LOW (ref 20–29)
Calcium: 10.3 mg/dL (ref 8.7–10.3)
Chloride: 105 mmol/L (ref 96–106)
Creatinine, Ser: 1.23 mg/dL — ABNORMAL HIGH (ref 0.57–1.00)
GFR calc Af Amer: 47 mL/min/{1.73_m2} — ABNORMAL LOW (ref >59)
GFR calc non Af Amer: 41 mL/min/{1.73_m2} — ABNORMAL LOW (ref >59)
Glucose: 189 mg/dL — ABNORMAL HIGH (ref 65–99)
Potassium: 5.1 mmol/L (ref 3.5–5.2)
Sodium: 138 mmol/L (ref 134–144)

## 2019-10-03 NOTE — Assessment & Plan Note (Signed)
Patient presents for evaluation of perineal abscess on right cheek. Patient underwent I&D of abscess in June on left cheek, see Dr.Vincenets previous note under problem. Small area on right cheek noted at the time. Patients daughter has been changing dressing on left cheek after I&D. She has appointment with [facility] tomorrow for follow up.   A/P: Perianal abscess - Follow up [facility] tomorrow.  - Dr.Vincent would like to have a telephone visit in 2 weeks to follow up on this abscess.  - doxycycline (ADOXA) 100 MG tablet; Take 1 tablet (100 mg total) by mouth 2 (two) times daily.  Dispense: 14 tablet; Refill: 0 - BMP8+Anion Gap - CBC with Diff - ibuprofen (ADVIL) 600 MG tablet; Take 1 tablet (600 mg total) by mouth every 8 (eight) hours as needed for moderate pain.  Dispense: 15 tablet; Refill: 0 - oxyCODONE (ROXICODONE) 5 MG immediate release tablet; Take 1 tablet (5 mg total) by mouth every 6 (six) hours as needed for up to 7 days for severe pain.  Dispense: 20 tablet; Refill: 0  Addendum: Mild elevated WBC , no left shift.  Cr stable. Anion gap metabolic acidosis on BMP. Patient could have mild lactic acidosis in setting of decrease PO intake. Given WBC findings I do not believe patient is becoming septic from infection.  Patient was slightly hypotensive compared to previous BP , but not symptomatic.  Daughter reported patient had decrease intake po intake recently,  encourage PO intake at visit . Will follow up tomorrow on patient status.

## 2019-10-04 DIAGNOSIS — J449 Chronic obstructive pulmonary disease, unspecified: Secondary | ICD-10-CM | POA: Diagnosis not present

## 2019-10-04 DIAGNOSIS — G4733 Obstructive sleep apnea (adult) (pediatric): Secondary | ICD-10-CM | POA: Diagnosis not present

## 2019-10-04 NOTE — Progress Notes (Signed)
Internal Medicine Clinic Attending  I saw and evaluated the patient.  I personally confirmed the key portions of the history and exam documented by Dr. Court Joy and I reviewed pertinent patient test results.  The assessment, diagnosis, and plan were formulated together and I agree with the documentation in the resident's note.   Sadly there is a new perianal abscess that is moderate in size on the right lower gluteal fold. No drainage or skin breakdown from it yet, but indurated and very painful. She is now off prednisone, which was used for PMR, and I was hopeful that would resolve these recurrent SSTI. We will start doxycycline to temporize the infection, she is well appearing with no signs of systemic inflammation. Thankfully she happens to have a general surgery follow up the next day, and likely they will be able to do incision and drainage in the office, or arrange for it in the OR in the coming days.

## 2019-10-05 ENCOUNTER — Ambulatory Visit: Payer: Medicare Other | Admitting: *Deleted

## 2019-10-05 ENCOUNTER — Other Ambulatory Visit: Payer: Self-pay | Admitting: Student in an Organized Health Care Education/Training Program

## 2019-10-05 DIAGNOSIS — I10 Essential (primary) hypertension: Secondary | ICD-10-CM

## 2019-10-05 DIAGNOSIS — N1832 Chronic kidney disease, stage 3b: Secondary | ICD-10-CM

## 2019-10-05 DIAGNOSIS — Z9181 History of falling: Secondary | ICD-10-CM

## 2019-10-05 DIAGNOSIS — K610 Anal abscess: Secondary | ICD-10-CM

## 2019-10-05 DIAGNOSIS — M353 Polymyalgia rheumatica: Secondary | ICD-10-CM

## 2019-10-05 DIAGNOSIS — E1169 Type 2 diabetes mellitus with other specified complication: Secondary | ICD-10-CM

## 2019-10-05 MED ORDER — ALBUTEROL SULFATE HFA 108 (90 BASE) MCG/ACT IN AERS
1.0000 | INHALATION_SPRAY | Freq: Four times a day (QID) | RESPIRATORY_TRACT | 2 refills | Status: DC | PRN
Start: 2019-10-05 — End: 2019-10-16

## 2019-10-05 NOTE — Chronic Care Management (AMB) (Signed)
Chronic Care Management   Follow Up Note   10/05/2019 Name: Caitlyn Aguirre MRN: [id number] DOB: 15-Jan-1936  Referred by: Axel Filler, MD Reason for referral : Chronic Care Management (NIDDM, HTN, COPD, CKD, CHF)   Caitlyn Aguirre is a 84 y.o. year old female who is a primary care patient of Axel Filler, MD. The CCM team was consulted for assistance with chronic disease management and care coordination needs.    Review of patient status, including review of consultants reports, relevant laboratory and other test results, and collaboration with appropriate care team members and the patient's provider was performed as part of comprehensive patient evaluation and provision of chronic care management services.    SDOH (Social Determinants of Health) assessments performed: No See Care Plan activities for detailed interventions related to [facility])     Outpatient Encounter Medications as of 10/05/2019  Medication Sig Note  . Accu-Chek Softclix Lancets lancets Check 1 time a day as instructed   . acetaminophen (TYLENOL) 325 MG tablet Take 2 tablets (650 mg total) by mouth every 6 (six) hours as needed for mild pain (or Fever >/= 101).   Marland Kitchen albuterol (PROAIR HFA) 108 (90 Base) MCG/ACT inhaler Inhale 1 puff into the lungs every 6 (six) hours as needed for wheezing or shortness of breath.   Marland Kitchen amLODipine (NORVASC) 5 MG tablet Take 1 tablet (5 mg total) by mouth daily.   Marland Kitchen aspirin EC 81 MG tablet Take 1 tablet (81 mg total) by mouth daily.   Marland Kitchen atorvastatin (LIPITOR) 40 MG tablet Take 1 tablet (40 mg total) by mouth daily.   . Blood Glucose Monitoring Suppl (ACCU-CHEK GUIDE ME) w/Device KIT Check 1 times a day as instructed 09/04/2019: Checks her blood sugar prn  . carvedilol (COREG) 25 MG tablet TAKE 1 TABLET BY MOUTH  TWICE DAILY WITH A MEAL   . doxycycline (ADOXA) 100 MG tablet Take 1 tablet (100 mg total) by mouth 2 (two) times daily.   Marland Kitchen glucose blood (ACCU-CHEK GUIDE) test strip Check  blood sugar 1 time per day   . ibuprofen (ADVIL) 600 MG tablet Take 1 tablet (600 mg total) by mouth every 8 (eight) hours as needed for moderate pain.   Marland Kitchen latanoprost (XALATAN) 0.005 % ophthalmic solution Place 1 drop into both eyes at bedtime.   Marland Kitchen levothyroxine (SYNTHROID) 88 MCG tablet TAKE 1 TABLET BY MOUTH  DAILY BEFORE BREAKFAST   . metFORMIN (GLUCOPHAGE) 1000 MG tablet TAKE 1 TABLET BY MOUTH  TWICE DAILY WITH A MEAL   . omeprazole (PRILOSEC) 20 MG capsule TAKE 1 CAPSULE BY MOUTH  DAILY   . OVER THE COUNTER MEDICATION Take 1 capsule by mouth as needed (heartburn and gas). Heartburn and gas chews 769m calcium carbonate and 855msimethicone   . oxyCODONE (ROXICODONE) 5 MG immediate release tablet Take 1 tablet (5 mg total) by mouth every 6 (six) hours as needed for up to 7 days for severe pain.   . OXYGEN Inhale 2 L into the lungs daily as needed (for breathing).  09/04/2019: States she uses at night  . PARoxetine (PAXIL) 30 MG tablet Take 1 tablet (30 mg total) by mouth daily.   . ramelteon (ROZEREM) 8 MG tablet Take 1 tablet (8 mg total) by mouth at bedtime. 07/15/2019: Lf 06/14/19 for 30 days  . SPIRIVA HANDIHALER 18 MCG inhalation capsule INHALE THE CONTENTS OF 1  CAPSULE BY MOUTH VIA  HANDIHALER DAILY (Patient taking differently: Place 18 mcg into inhaler  and inhale daily as needed (for wheezing or shortness of breath.). ) 09/04/2019: Reviewed with patient that this medicine is to be used daily not prn   No facility-administered encounter medications on file as of 10/05/2019.     Objective:  Wt Readings from Last 3 Encounters:  10/02/19 196 lb 9.6 oz (89.2 kg)  08/21/19 200 lb 3.2 oz (90.8 kg)  07/31/19 207 lb 3.2 oz (94 kg)    Goals Addressed              This Visit's Progress     Patient Stated   .  "I don't have a glucometer and my doctor told me to check my blood sugar every day." (pt-stated)        CARE PLAN ENTRY (see longitudinal plan of care for additional care plan  information)  Current Barriers:  . Chronic Disease Management support, education, and care coordination needs related to CHF, HTN, HLD, COPD, DMII, CKD Stage 3, and Depression- spoke with patient , she states she never received her glucometer and test strips from [facility] but does have the Accucheck glucometer this [place] her in May, she says her daughter has not checked her blood sugar lately, patient says she cannot stick her finger  Clinical Goal(s) related to CHF, HTN, HLD, COPD, DMII, CKD Stage 3, and Depression:  Over the next  30-60 days, patient will:  . Work with the care management team to address educational, disease management, and care coordination needs  . Begin or continue self health monitoring activities as directed today Measure and record cbg (blood glucose) at least one times daily . Call provider office for new or worsened signs and symptoms Blood glucose findings outside established parameters, Chest pain, Shortness of breath, and New or worsened symptom related to HF, COPD, HTN  or DM . Call care management team with questions or concerns . Verbalize basic understanding of patient centered plan of care established today  Interventions related to CHF, HTN, HLD, COPD, DMII, CKD Stage 3, and Depression:  . Called [facility] at [phone] and arranged for glucometer and testing supplies to be shipped to  patient's home address; informed patient by phone    Patient Self Care Activities related to CHF, HTN, HLD, COPD, DMII, CKD Stage 3, and Depression:  . Patient is unable to independently self-manage chronic health conditions  Please see past updates related to this goal by clicking on the "Past Updates" button in the selected goal      .  "I have a new sore on my bottom and it really hurts at times" (pt-stated)        [place] (see longitudinal plan of care for additional care plan information)  Current Barriers:  Marland Kitchen Knowledge Deficits related to strategies to  promote wound healing - patient says she saw the surgeon earlier this week and had a new perianal abscess drained in the surgeon's office, she says her butt is very sore and she using the oxycodone to treat the pain, she says she will see the surgeon again tomorrow  Nurse Case Manager Clinical Goal(s):  Marland Kitchen Over the next 30-60 days, patient will verbalize understanding of plan to implement strategies to promote wound healing . Over the next 30-60 days, patient will take her medications as prescribed and voice adequate relief from wound pain . Over the next 30-60 days, patient's buttock wound will heal completely  Interventions:  . Inter-disciplinary care team collaboration (see longitudinal plan of  care) . Reviewed basic wound healing strategies . Reviewed medications with patient and ensured she is taking the doxycycline  Patient Self Care Activities:  . Patient verbalizes understanding of plan to treat wound pain with 600 mg of Ibuprofen . Self administers medications as prescribed- and completed prednisone taper correctly . Does not adhere to prescribed medication regimen  Please see past updates related to this goal by clicking on the "Past Updates" button in the selected goal      .  "Mr [name] said he was going to help me get an alarm button to hang around my neck but he never did." (pt-stated)        [place] (see longitudinal plan of care for additional care plan information)  Current Barriers:  . Care Coordination needs related to personal emergency response system  in a patient with HTN, NIDDM, COPD, HF,CKD  and polymyalgia rheumatica- patient states her [facility] Medicare broker told her he would help her get a personal emergency response system  Nurse Case Manager Clinical Goal(s):  Marland Kitchen Over the next 30 days, patient will work with CCM RN and [facility] Medicare broker to address needs related to personal emergency response system  Interventions:  . Inter-disciplinary care team  collaboration (see longitudinal plan of care) . Obtained contact information for patient's [facility] Medicare broker Alena Bills and patient gave this CCM RN permission to speak with him . Will collborate with [facility] Medicare broker regarding personal emergency response system  Patient Self Care Activities:  . Patient verbalizes understanding of plan to work with CCM Rn and Conservation officer, nature to secure personal emergency response system . Self administers medications as prescribed . Attends all scheduled provider appointments . Calls pharmacy for medication refills . Performs ADL's independently . Performs IADL's independently . Calls provider office for new concerns or questions . Unable to independently secure  personal emergency response system  Initial goal documentation       Other   .  Blood Pressure < 140/90        BP Readings from Last 3 Encounters:  10/02/19 (!) 99/46  08/21/19 112/61  07/31/19 130/67     Meeting blood pressure targets Voices good medication taking behavior    .  HEMOGLOBIN A1C < 8         Lab Results  Component Value Date   HGBA1C 9.2 (H) 07/16/2019     Not  Meeting A1C target with significant increase in Hgb A1C from 7.1% on 05/08/19, most likely due to Prednisone Rx    .  LDL CALC < 100        Lab Results  Component Value Date   CHOL 188 11/29/2017   HDL 57 11/29/2017   LDLCALC 108 (H) 11/29/2017   TRIG 113 11/29/2017   CHOLHDL 3.3 11/29/2017     Needs updated lipid panel- on statin therapy and has DM        Plan:   The care management team will reach out to the patient again over the next 30-60 days.    Kelli Churn RN, CCM, [facility] RN Care Manager [phone]

## 2019-10-05 NOTE — Patient Instructions (Signed)
Visit Information It was nice speaking with you today. You should receive your diabetes testing supplies in the mail in the next 2 weeks. I will call your broker to check on the personal safety alarm system.   Goals Addressed              This Visit's Progress     Patient Stated   .  "I don't have a glucometer and my doctor told me to check my blood sugar every day." (pt-stated)        CARE PLAN ENTRY (see longitudinal plan of care for additional care plan information)  Current Barriers:  . Chronic Disease Management support, education, and care coordination needs related to CHF, HTN, HLD, COPD, DMII, CKD Stage 3, and Depression- spoke with patient , she states she never received her glucometer and test strips from [facility] but does have the Accucheck glucometer this [place] her in May, she says her daughter has not checked her blood sugar lately, patient says she cannot stick her finger  Clinical Goal(s) related to CHF, HTN, HLD, COPD, DMII, CKD Stage 3, and Depression:  Over the next  30-60 days, patient will:  . Work with the care management team to address educational, disease management, and care coordination needs  . Begin or continue self health monitoring activities as directed today Measure and record cbg (blood glucose) at least one times daily . Call provider office for new or worsened signs and symptoms Blood glucose findings outside established parameters, Chest pain, Shortness of breath, and New or worsened symptom related to HF, COPD, HTN  or DM . Call care management team with questions or concerns . Verbalize basic understanding of patient centered plan of care established today  Interventions related to CHF, HTN, HLD, COPD, DMII, CKD Stage 3, and Depression:  . Called [facility] at [phone] and arranged for glucometer and testing supplies to be shipped to  patient's home address; informed patient by phone    Patient Self Care Activities related to CHF, HTN,  HLD, COPD, DMII, CKD Stage 3, and Depression:  . Patient is unable to independently self-manage chronic health conditions  Please see past updates related to this goal by clicking on the "Past Updates" button in the selected goal      .  "I have a new sore on my bottom and it really hurts at times" (pt-stated)        [place] (see longitudinal plan of care for additional care plan information)  Current Barriers:  Marland Kitchen Knowledge Deficits related to strategies to promote wound healing - patient says she saw the surgeon earlier this week and had a new perianal abscess drained in the surgeon's office, she says her butt is very sore and she using the oxycodone to treat the pain, she says she will see the surgeon again tomorrow  Nurse Case Manager Clinical Goal(s):  Marland Kitchen Over the next 30-60 days, patient will verbalize understanding of plan to implement strategies to promote wound healing . Over the next 30-60 days, patient will take her medications as prescribed and voice adequate relief from wound pain . Over the next 30-60 days, patient's buttock wound will heal completely  Interventions:  . Inter-disciplinary care team collaboration (see longitudinal plan of care) . Reviewed basic wound healing strategies . Reviewed medications with patient and ensured she is taking the doxycycline  Patient Self Care Activities:  . Patient verbalizes understanding of plan to treat wound pain with 600 mg  of Ibuprofen . Self administers medications as prescribed- and completed prednisone taper correctly . Does not adhere to prescribed medication regimen  Please see past updates related to this goal by clicking on the "Past Updates" button in the selected goal      .  "Mr [name] said he was going to help me get an alarm button to hang around my neck but he never did." (pt-stated)        [place] (see longitudinal plan of care for additional care plan information)  Current Barriers:  . Care  Coordination needs related to personal emergency response system  in a patient with HTN, NIDDM, COPD, HF,CKD  and polymyalgia rheumatica- patient states her [facility] Medicare broker told her he would help her get a personal emergency response system  Nurse Case Manager Clinical Goal(s):  Marland Kitchen Over the next 30 days, patient will work with CCM RN and [facility] Medicare broker to address needs related to personal emergency response system  Interventions:  . Inter-disciplinary care team collaboration (see longitudinal plan of care) . Obtained contact information for patient's [facility] Medicare broker Alena Bills and patient gave this CCM RN permission to speak with him . Collaborated with [facility] Medicare broker regarding personal emergency response system  Patient Self Care Activities:  . Patient verbalizes understanding of plan to work with CCM Rn and Conservation officer, nature to secure personal emergency response system . Self administers medications as prescribed . Attends all scheduled provider appointments . Calls pharmacy for medication refills . Performs ADL's independently . Performs IADL's independently . Calls provider office for new concerns or questions . Unable to independently secure  personal emergency response system  Initial goal documentation       Other   .  Blood Pressure < 140/90        BP Readings from Last 3 Encounters:  10/02/19 (!) 99/46  08/21/19 112/61  07/31/19 130/67     Meeting blood pressure targets Voices good medication taking behavior    .  HEMOGLOBIN A1C < 8         Lab Results  Component Value Date   HGBA1C 9.2 (H) 07/16/2019     Not  Meeting A1C target with significant increase in Hgb A1C from 7.1% on 05/08/19, most likely due to Prednisone Rx    .  LDL CALC < 100        Lab Results  Component Value Date   CHOL 188 11/29/2017   HDL 57 11/29/2017   LDLCALC 108 (H) 11/29/2017   TRIG 113 11/29/2017   CHOLHDL 3.3 11/29/2017     Needs updated lipid panel- on statin  therapy and has DM       The patient verbalized understanding of instructions provided today and declined a print copy of patient instruction materials.   The care management team will reach out to the patient again over the next 30-60 days.   Kelli Churn RN, CCM, [facility] RN Care Manager [phone]

## 2019-10-06 ENCOUNTER — Ambulatory Visit: Payer: Medicare Other | Admitting: *Deleted

## 2019-10-06 DIAGNOSIS — M353 Polymyalgia rheumatica: Secondary | ICD-10-CM

## 2019-10-06 DIAGNOSIS — N1832 Chronic kidney disease, stage 3b: Secondary | ICD-10-CM

## 2019-10-06 DIAGNOSIS — E1169 Type 2 diabetes mellitus with other specified complication: Secondary | ICD-10-CM

## 2019-10-06 DIAGNOSIS — I10 Essential (primary) hypertension: Secondary | ICD-10-CM

## 2019-10-06 NOTE — Chronic Care Management (AMB) (Signed)
Chronic Care Management   Follow Up Note   10/06/2019 Name: Caitlyn Aguirre MRN: [id number] DOB: Nov 03, 1935  Referred by: Axel Filler, MD Reason for referral : Chronic Care Management (NIDDM, HTN, COPD, CKD, HF)   Caitlyn Aguirre is a 84 y.o. year old female who is a primary care patient of Axel Filler, MD. The CCM team was consulted for assistance with chronic disease management and care coordination needs.    Review of patient status, including review of consultants reports, relevant laboratory and other test results, and collaboration with appropriate care team members and the patient's provider was performed as part of comprehensive patient evaluation and provision of chronic care management services.    SDOH (Social Determinants of Health) assessments performed: No See Care Plan activities for detailed interventions related to [facility])     Outpatient Encounter Medications as of 10/06/2019  Medication Sig Note  . Accu-Chek Softclix Lancets lancets Check 1 time a day as instructed   . acetaminophen (TYLENOL) 325 MG tablet Take 2 tablets (650 mg total) by mouth every 6 (six) hours as needed for mild pain (or Fever >/= 101).   Marland Kitchen albuterol (PROAIR HFA) 108 (90 Base) MCG/ACT inhaler Inhale 1 puff into the lungs every 6 (six) hours as needed for wheezing or shortness of breath.   Marland Kitchen amLODipine (NORVASC) 5 MG tablet Take 1 tablet (5 mg total) by mouth daily.   Marland Kitchen aspirin EC 81 MG tablet Take 1 tablet (81 mg total) by mouth daily.   Marland Kitchen atorvastatin (LIPITOR) 40 MG tablet Take 1 tablet (40 mg total) by mouth daily.   . Blood Glucose Monitoring Suppl (ACCU-CHEK GUIDE ME) w/Device KIT Check 1 times a day as instructed 09/04/2019: Checks her blood sugar prn  . carvedilol (COREG) 25 MG tablet TAKE 1 TABLET BY MOUTH  TWICE DAILY WITH A MEAL   . doxycycline (ADOXA) 100 MG tablet Take 1 tablet (100 mg total) by mouth 2 (two) times daily.   Marland Kitchen glucose blood (ACCU-CHEK GUIDE) test strip Check  blood sugar 1 time per day   . ibuprofen (ADVIL) 600 MG tablet Take 1 tablet (600 mg total) by mouth every 8 (eight) hours as needed for moderate pain.   Marland Kitchen latanoprost (XALATAN) 0.005 % ophthalmic solution Place 1 drop into both eyes at bedtime.   Marland Kitchen levothyroxine (SYNTHROID) 88 MCG tablet TAKE 1 TABLET BY MOUTH  DAILY BEFORE BREAKFAST   . metFORMIN (GLUCOPHAGE) 1000 MG tablet TAKE 1 TABLET BY MOUTH  TWICE DAILY WITH A MEAL   . omeprazole (PRILOSEC) 20 MG capsule TAKE 1 CAPSULE BY MOUTH  DAILY   . OVER THE COUNTER MEDICATION Take 1 capsule by mouth as needed (heartburn and gas). Heartburn and gas chews 734m calcium carbonate and 878msimethicone   . oxyCODONE (ROXICODONE) 5 MG immediate release tablet Take 1 tablet (5 mg total) by mouth every 6 (six) hours as needed for up to 7 days for severe pain.   . OXYGEN Inhale 2 L into the lungs daily as needed (for breathing).  09/04/2019: States she uses at night  . PARoxetine (PAXIL) 30 MG tablet Take 1 tablet (30 mg total) by mouth daily.   . ramelteon (ROZEREM) 8 MG tablet Take 1 tablet (8 mg total) by mouth at bedtime. 07/15/2019: Lf 06/14/19 for 30 days  . SPIRIVA HANDIHALER 18 MCG inhalation capsule INHALE THE CONTENTS OF 1  CAPSULE BY MOUTH VIA  HANDIHALER DAILY (Patient taking differently: Place 18 mcg into inhaler  and inhale daily as needed (for wheezing or shortness of breath.). ) 09/04/2019: Reviewed with patient that this medicine is to be used daily not prn   No facility-administered encounter medications on file as of 10/06/2019.     Objective:  Wt Readings from Last 3 Encounters:  10/02/19 196 lb 9.6 oz (89.2 kg)  08/21/19 200 lb 3.2 oz (90.8 kg)  07/31/19 207 lb 3.2 oz (94 kg)    Goals Addressed              This Visit's Progress     Patient Stated   .  " I need a wheelchair." (pt-stated)        [place] (see longitudinal plan of care for additional care plan information)  Current Barriers:  . Care Coordination needs  related to needed DME/Supplies in a patient with CHF, HTN, HLD, COPD, DMII, and CKD Stage 3 and polymyalgia rheumatica. . Care Coordination needs related to securing w/c  in a patient with CHF, HTN, HLD, COPD, DMII, and CKD Stage 3 and polymyalgia rheumatica.  Nurse Case Manager Clinical Goal(s):  Marland Kitchen Over the next 30-90 days, patient will verbalize understanding of plan to obtain needed DME.  . Over the next 30-90 days, patient will work with care guide and/or  in network DME supplier  to address needs related to DME needs. . Over the next 30-90 days, patient will verbalize understanding of plan to work with health care team to secure a w/c.   Interventions:  . Inter-disciplinary care team collaboration (see longitudinal plan of care)- will consult with [employer] representatives to determine process for securing w/c for paitent . Collaboration with/confirmation of receipt of DME orders at in network DME supplier . Will message provider for order for w/c . Care Guide referral for assistance with securing patient a w/c  Patient Self Care Activities:  . Patient verbalizes understanding of plan to work with health care team in obtaining a w/c . Unable to independently secure w/c to use when outside of home.   Initial goal documentation     .  "Mr [name] said he was going to help me get an alarm button to hang around my neck but he never did." (pt-stated)        CARE PLAN ENTRY (see longitudinal plan of care for additional care plan information)  Current Barriers:  . Care Coordination needs related to personal emergency response system  in a patient with HTN, NIDDM, COPD, HF,CKD  and polymyalgia rheumatica- patient states her [facility] Medicare broker told her he would help her get a personal emergency response system  Nurse Case Manager Clinical Goal(s):  Marland Kitchen Over the next 30 days, patient will work with CCM RN and [facility] Medicare broker to address needs related to personal emergency response  system  Interventions:  . Inter-disciplinary care team collaboration (see longitudinal plan of care) . Obtained contact information for patient's [facility] Medicare broker Alena Bills and patient gave this CCM RN permission to speak with him . 10/06/19 Collaborated with [facility] Medicare broker , Alena Bills at [phone], regarding personal emergency response system- he will contact patient about using her $300.00 per quarter OTC benefit to assist her with securing a personal safety alarm system from the [facility] OTC catalog   Patient Self Care Activities:  . Patient verbalizes understanding of plan to work with CCM Rn and Conservation officer, nature to secure personal emergency response system . Self administers medications as prescribed . Attends all scheduled provider appointments .  Calls pharmacy for medication refills . Performs ADL's independently . Performs IADL's independently . Calls provider office for new concerns or questions . Unable to independently secure  personal emergency response system  Initial goal documentation         Plan:   The care management team will reach out to the patient again over the next 30-60 days.    Kelli Churn RN, CCM, [facility] RN Care Manager [phone]

## 2019-10-06 NOTE — Progress Notes (Addendum)
Internal Medicine Clinic Resident  I have personally reviewed this encounter including the documentation in this note and/or discussed this patient with the care management provider. I will address any urgent items identified by the care management provider and will communicate my actions to the patient's PCP. I have reviewed the patient's CCM visit with my supervising attending, Dr Jimmye Norman.  Jean Rosenthal, MD 10/06/2019   Dorian Pod, MD Internal Medicine

## 2019-10-06 NOTE — Progress Notes (Signed)
Internal Medicine Clinic Resident  I have personally reviewed this encounter including the documentation in this note and/or discussed this patient with the care management provider. I will address any urgent items identified by the care management provider and will communicate my actions to the patient's PCP. I have reviewed the patient's CCM visit with my supervising attending, Dr Evette Doffing.  Jean Rosenthal, MD 10/06/2019

## 2019-10-06 NOTE — Patient Instructions (Signed)
Visit Information Mr [name] will be in touch with you about the personal emergency response system.  I will ask your clinic providers to write an order for a manual w/c.   Goals Addressed              This Visit's Progress     Patient Stated   .  " I need a wheelchair." (pt-stated)        CARE PLAN ENTRY (see longitudinal plan of care for additional care plan information)  Current Barriers:  . Care Coordination needs related to needed DME/Supplies in a patient with CHF, HTN, HLD, COPD, DMII, and CKD Stage 3 and polymyalgia rheumatica. . Care Coordination needs related to securing w/c  in a patient with CHF, HTN, HLD, COPD, DMII, and CKD Stage 3 and polymyalgia rheumatica.  Nurse Case Manager Clinical Goal(s):  Marland Kitchen Over the next 30-90 days, patient will verbalize understanding of plan to obtain needed DME.  . Over the next 30-90 days, patient will work with care guide and/or  in network DME supplier  to address needs related to DME needs. . Over the next 30-90 days, patient will verbalize understanding of plan to work with health care team to secure a w/c.   Interventions:  . Inter-disciplinary care team collaboration (see longitudinal plan of care) . Collaboration with/confirmation of receipt of DME orders at in network DME supplier . Will message provider for order for w/c . Care Guide referral for assistance with securing patient a w/c  Patient Self Care Activities:  . Patient verbalizes understanding of plan to work with health care team in obtaining a w/c . Unable to independently secure w/c to use when outside of home.   Initial goal documentation     .  "Mr [name] said he was going to help me get an alarm button to hang around my neck but he never did." (pt-stated)        CARE PLAN ENTRY (see longitudinal plan of care for additional care plan information)  Current Barriers:  . Care Coordination needs related to personal emergency response system  in a patient with HTN,  NIDDM, COPD, HF,CKD  and polymyalgia rheumatica- patient states her [facility] Medicare broker told her he would help her get a personal emergency response system  Nurse Case Manager Clinical Goal(s):  Marland Kitchen Over the next 30 days, patient will work with CCM RN and [facility] Medicare broker to address needs related to personal emergency response system  Interventions:  . Inter-disciplinary care team collaboration (see longitudinal plan of care) . Obtained contact information for patient's [facility] Medicare broker Alena Bills and patient gave this CCM RN permission to speak with him . 10/06/19 Collaborated with [facility] Medicare broker , Alena Bills at [phone], regarding personal emergency response system- he will contact patient about using her $300.00 per quarter OTC benefit to assist her with securing a personal safety alarm system from the [facility] OTC catalog   Patient Self Care Activities:  . Patient verbalizes understanding of plan to work with CCM Rn and Conservation officer, nature to secure personal emergency response system . Self administers medications as prescribed . Attends all scheduled provider appointments . Calls pharmacy for medication refills . Performs ADL's independently . Performs IADL's independently . Calls provider office for new concerns or questions . Unable to independently secure  personal emergency response system  Initial goal documentation        The patient verbalized understanding of instructions provided today and declined a print copy  of patient instruction materials.   The care management team will reach out to the patient again over the next 30-60 days.   Kelli Churn RN, CCM, [facility] RN Care Manager [phone]

## 2019-10-06 NOTE — Progress Notes (Signed)
Internal Medicine Clinic Attending  CCM services provided by the care management provider and their documentation were discussed with Dr. Eileen Stanford. We reviewed the pertinent findings, urgent action items addressed by the resident and non-urgent items to be addressed by the PCP.  I agree with the assessment, diagnosis, and plan of care documented in the CCM and resident's note.  Axel Filler, MD 10/06/2019

## 2019-10-13 ENCOUNTER — Ambulatory Visit: Payer: Medicare Other | Admitting: *Deleted

## 2019-10-13 DIAGNOSIS — N1832 Chronic kidney disease, stage 3b: Secondary | ICD-10-CM

## 2019-10-13 DIAGNOSIS — I10 Essential (primary) hypertension: Secondary | ICD-10-CM

## 2019-10-13 DIAGNOSIS — M353 Polymyalgia rheumatica: Secondary | ICD-10-CM

## 2019-10-13 DIAGNOSIS — E1169 Type 2 diabetes mellitus with other specified complication: Secondary | ICD-10-CM

## 2019-10-13 NOTE — Chronic Care Management (AMB) (Addendum)
Chronic Care Management   Follow Up Note   10/13/2019 Name: HALAH WHITESIDE MRN: [id number] DOB: [birth year]  Referred by: Axel Filler, MD Reason for referral : Chronic Care Management (NIDDM, HTN, COPD, CKD, HF)   MCKINZEY ENTWISTLE is a 84 y.o. year old female who is a primary care patient of Axel Filler, MD. The CCM team was consulted for assistance with chronic disease management and care coordination needs.    Review of patient status, including review of consultants reports, relevant laboratory and other test results, and collaboration with appropriate care team members and the patient's provider was performed as part of comprehensive patient evaluation and provision of chronic care management services.    SDOH (Social Determinants of Health) assessments performed: No See Care Plan activities for detailed interventions related to [facility])     Outpatient Encounter Medications as of 10/13/2019  Medication Sig Note  . Accu-Chek Softclix Lancets lancets Check 1 time a day as instructed   . acetaminophen (TYLENOL) 325 MG tablet Take 2 tablets (650 mg total) by mouth every 6 (six) hours as needed for mild pain (or Fever >/= 101).   Marland Kitchen albuterol (PROAIR HFA) 108 (90 Base) MCG/ACT inhaler Inhale 1 puff into the lungs every 6 (six) hours as needed for wheezing or shortness of breath.   Marland Kitchen amLODipine (NORVASC) 5 MG tablet Take 1 tablet (5 mg total) by mouth daily.   Marland Kitchen aspirin EC 81 MG tablet Take 1 tablet (81 mg total) by mouth daily.   Marland Kitchen atorvastatin (LIPITOR) 40 MG tablet Take 1 tablet (40 mg total) by mouth daily.   . Blood Glucose Monitoring Suppl (ACCU-CHEK GUIDE ME) w/Device KIT Check 1 times a day as instructed 09/04/2019: Checks her blood sugar prn  . carvedilol (COREG) 25 MG tablet TAKE 1 TABLET BY MOUTH  TWICE DAILY WITH A MEAL   . doxycycline (ADOXA) 100 MG tablet Take 1 tablet (100 mg total) by mouth 2 (two) times daily.   Marland Kitchen glucose blood (ACCU-CHEK GUIDE) test strip  Check blood sugar 1 time per day   . ibuprofen (ADVIL) 600 MG tablet Take 1 tablet (600 mg total) by mouth every 8 (eight) hours as needed for moderate pain.   Marland Kitchen latanoprost (XALATAN) 0.005 % ophthalmic solution Place 1 drop into both eyes at bedtime.   Marland Kitchen levothyroxine (SYNTHROID) 88 MCG tablet TAKE 1 TABLET BY MOUTH  DAILY BEFORE BREAKFAST   . metFORMIN (GLUCOPHAGE) 1000 MG tablet TAKE 1 TABLET BY MOUTH  TWICE DAILY WITH A MEAL   . omeprazole (PRILOSEC) 20 MG capsule TAKE 1 CAPSULE BY MOUTH  DAILY   . OVER THE COUNTER MEDICATION Take 1 capsule by mouth as needed (heartburn and gas). Heartburn and gas chews $RemoveB'750mg'eQhTfVLK$  calcium carbonate and $RemoveBefor'80mg'FvNuzYsXiRRQ$  simethicone   . OXYGEN Inhale 2 L into the lungs daily as needed (for breathing).  09/04/2019: States she uses at night  . PARoxetine (PAXIL) 30 MG tablet Take 1 tablet (30 mg total) by mouth daily.   . ramelteon (ROZEREM) 8 MG tablet Take 1 tablet (8 mg total) by mouth at bedtime. 07/15/2019: Lf 06/14/19 for 30 days  . SPIRIVA HANDIHALER 18 MCG inhalation capsule INHALE THE CONTENTS OF 1  CAPSULE BY MOUTH VIA  HANDIHALER DAILY (Patient taking differently: Place 18 mcg into inhaler and inhale daily as needed (for wheezing or shortness of breath.). ) 09/04/2019: Reviewed with patient that this medicine is to be used daily not prn   No facility-administered encounter medications  on file as of 10/13/2019.     Objective:  Wt Readings from Last 3 Encounters:  10/02/19 196 lb 9.6 oz (89.2 kg)  08/21/19 200 lb 3.2 oz (90.8 kg)  07/31/19 207 lb 3.2 oz (94 kg)   BP Readings from Last 3 Encounters:  10/02/19 (!) 99/46  08/21/19 112/61  07/31/19 130/67   Lab Results  Component Value Date   HGBA1C 9.2 (H) 07/16/2019    Goals Addressed              This Visit's Progress     Patient Stated   .  " I need a refill on my red inhaler." (pt-stated)        CARE PLAN ENTRY (see longitudinal plan of care for additional care plan information)  Current Barriers:   Marland Kitchen Knowledge deficit related to basic understanding of how to use inhalers and how inhaled medications work- patient states she no longer gets refills on her "red inhaler" and she doesn't know why- 10/13/19- patient states she is at the drugstore and the pharmacist is telling her the albuterol is not covered    Case Manager Clinical Goal(s):  Over the next 30-60 days, patient will be able to verbalize understanding of COPD action plan and when to seek appropriate levels of medical care   Interventions:   Provided patient with basic verbal COPD education on self care/management/and exacerbation prevention   Reviewed medications and assessed medication taking behavior  Provided instruction about proper use of medications used for management of COPD including inhalers  Discussed difference between maintenance (Spiriva)and rescue medications (albuterol inhaler and nebulizer)  Messaged provider that patient needs refills on albuterol inhaler  Will message provider about albuterol not being covered so they can discuss with patient during 9/13 telehealth clinic visit  Patient Self Care Activities:  . Self administers medications as prescribed . Attends all scheduled provider appointments . Calls pharmacy for medication refills . Calls provider office for new concerns or questions . Unable to independently obtain refills on albuterol inhaler  Please see past updates related to this goal by clicking on the "Past Updates" button in the selected goal     .  " I need a wheelchair." (pt-stated)        [place] (see longitudinal plan of care for additional care plan information)  Current Barriers:  . Care Coordination needs related to needed DME/Supplies in a patient with CHF, HTN, HLD, COPD, DMII, and CKD Stage 3 and polymyalgia rheumatica. . Care Coordination needs related to securing w/c  in a patient with CHF, HTN, HLD, COPD, DMII, and CKD Stage 3 and polymyalgia rheumatica.  Nurse  Case Manager Clinical Goal(s):  Marland Kitchen Over the next 30-90 days, patient will verbalize understanding of plan to obtain needed DME.  . Over the next 30-90 days, patient will work with care guide and/or  in network DME supplier  to address needs related to DME needs. . Over the next 30-90 days, patient will verbalize understanding of plan to work with health care team to secure a w/c.   Interventions:  . Inter-disciplinary care team collaboration (see longitudinal plan of care) . Collaboration with/confirmation of receipt of DME orders at in network DME supplier . Will message provider for order for w/c . Care Guide referral for assistance with securing patient a w/c . 10/13/19- Called patient to tell her this CCM RN has asked provider to document the need for wheelchair during telehealth visit on 10/16/19   Patient  Self Care Activities:  . Patient verbalizes understanding of plan to work with health care team in obtaining a w/c . Unable to independently secure w/c to use when outside of home.   Please see past updates related to this goal by clicking on the "Past Updates" button in the selected goal          Plan:   The care management team will reach out to the patient again over the next 30-60 days.    Kelli Churn RN, CCM, [facility] RN Care Manager [phone]

## 2019-10-16 ENCOUNTER — Ambulatory Visit (INDEPENDENT_AMBULATORY_CARE_PROVIDER_SITE_OTHER): Payer: Medicare Other | Admitting: Student in an Organized Health Care Education/Training Program

## 2019-10-16 ENCOUNTER — Ambulatory Visit: Payer: Medicare Other | Admitting: *Deleted

## 2019-10-16 ENCOUNTER — Telehealth: Payer: Self-pay | Admitting: Student in an Organized Health Care Education/Training Program

## 2019-10-16 DIAGNOSIS — M353 Polymyalgia rheumatica: Secondary | ICD-10-CM | POA: Diagnosis not present

## 2019-10-16 DIAGNOSIS — I10 Essential (primary) hypertension: Secondary | ICD-10-CM | POA: Diagnosis not present

## 2019-10-16 DIAGNOSIS — N1832 Chronic kidney disease, stage 3b: Secondary | ICD-10-CM

## 2019-10-16 DIAGNOSIS — E1169 Type 2 diabetes mellitus with other specified complication: Secondary | ICD-10-CM

## 2019-10-16 DIAGNOSIS — K610 Anal abscess: Secondary | ICD-10-CM

## 2019-10-16 DIAGNOSIS — J449 Chronic obstructive pulmonary disease, unspecified: Secondary | ICD-10-CM

## 2019-10-16 MED ORDER — ALBUTEROL SULFATE HFA 108 (90 BASE) MCG/ACT IN AERS: 1.0000 | INHALATION_SPRAY | Freq: Four times a day (QID) | RESPIRATORY_TRACT | 2 refills | Status: DC | PRN

## 2019-10-16 MED ORDER — ALBUTEROL SULFATE (2.5 MG/3ML) 0.083% IN NEBU: 2.5000 mg | INHALATION_SOLUTION | RESPIRATORY_TRACT | 2 refills | Status: DC | PRN

## 2019-10-16 NOTE — Progress Notes (Signed)
Internal Medicine Clinic Resident  I have personally reviewed this encounter including the documentation in this note and/or discussed this patient with the care management provider. I will address any urgent items identified by the care management provider and will communicate my actions to the patient's PCP. I have reviewed the patient's CCM visit with my supervising attending, Dr Evette Doffing.  Foy Guadalajara, MD 10/16/2019

## 2019-10-16 NOTE — Chronic Care Management (AMB) (Signed)
Chronic Care Management   Follow Up Note   10/16/2019 Name: Caitlyn Aguirre MRN: [id number] DOB: [birth year]  Referred by: Axel Filler, MD Reason for referral : Chronic Care Management (NIDDM, HTN, COPD, CKD, HF, polymyalgia rheumatica)   Caitlyn Aguirre is a 84 y.o. year old female who is a primary care patient of Axel Filler, MD. The CCM team was consulted for assistance with chronic disease management and care coordination needs.    Review of patient status, including review of consultants reports, relevant laboratory and other test results, and collaboration with appropriate care team members and the patient's provider was performed as part of comprehensive patient evaluation and provision of chronic care management services.    SDOH (Social Determinants of Health) assessments performed: No See Care Plan activities for detailed interventions related to [facility])     Outpatient Encounter Medications as of 10/16/2019  Medication Sig Note  . Accu-Chek Softclix Lancets lancets Check 1 time a day as instructed   . acetaminophen (TYLENOL) 325 MG tablet Take 2 tablets (650 mg total) by mouth every 6 (six) hours as needed for mild pain (or Fever >/= 101).   Marland Kitchen albuterol (PROVENTIL HFA) 108 (90 Base) MCG/ACT inhaler Inhale 1 puff into the lungs every 6 (six) hours as needed for wheezing or shortness of breath.   Marland Kitchen amLODipine (NORVASC) 5 MG tablet Take 1 tablet (5 mg total) by mouth daily.   Marland Kitchen aspirin EC 81 MG tablet Take 1 tablet (81 mg total) by mouth daily.   Marland Kitchen atorvastatin (LIPITOR) 40 MG tablet Take 1 tablet (40 mg total) by mouth daily.   . Blood Glucose Monitoring Suppl (ACCU-CHEK GUIDE ME) w/Device KIT Check 1 times a day as instructed 09/04/2019: Checks her blood sugar prn  . carvedilol (COREG) 25 MG tablet TAKE 1 TABLET BY MOUTH  TWICE DAILY WITH A MEAL   . chlorthalidone (HYGROTON) 25 MG tablet Take 25 mg by mouth daily.   Marland Kitchen glucose blood (ACCU-CHEK GUIDE) test strip  Check blood sugar 1 time per day   . latanoprost (XALATAN) 0.005 % ophthalmic solution Place 1 drop into both eyes at bedtime.   Marland Kitchen levothyroxine (SYNTHROID) 88 MCG tablet TAKE 1 TABLET BY MOUTH  DAILY BEFORE BREAKFAST   . lisinopril (ZESTRIL) 40 MG tablet    . metFORMIN (GLUCOPHAGE) 1000 MG tablet TAKE 1 TABLET BY MOUTH  TWICE DAILY WITH A MEAL   . omeprazole (PRILOSEC) 20 MG capsule TAKE 1 CAPSULE BY MOUTH  DAILY   . OXYGEN Inhale 2 L into the lungs daily as needed (for breathing).  09/04/2019: States she uses at night  . PARoxetine (PAXIL) 30 MG tablet Take 1 tablet (30 mg total) by mouth daily.   . ramelteon (ROZEREM) 8 MG tablet Take 1 tablet (8 mg total) by mouth at bedtime. 07/15/2019: Lf 06/14/19 for 30 days  . SPIRIVA HANDIHALER 18 MCG inhalation capsule INHALE THE CONTENTS OF 1  CAPSULE BY MOUTH VIA  HANDIHALER DAILY (Patient taking differently: Place 18 mcg into inhaler and inhale daily as needed (for wheezing or shortness of breath.). ) 09/04/2019: Reviewed with patient that this medicine is to be used daily not prn   No facility-administered encounter medications on file as of 10/16/2019.     Objective:  BP Readings from Last 3 Encounters:  10/02/19 (!) 99/46  08/21/19 112/61  07/31/19 130/67   Wt Readings from Last 3 Encounters:  10/02/19 196 lb 9.6 oz (89.2 kg)  08/21/19 200 lb  3.2 oz (90.8 kg)  07/31/19 207 lb 3.2 oz (94 kg)   Lab Results  Component Value Date   HGBA1C 9.2 (H) 07/16/2019    Goals Addressed              This Visit's Progress     Patient Stated   .  " I need a wheelchair." (pt-stated)        Caitlyn Aguirre (see longitudinal plan of care for additional care plan information)  Current Barriers:  . Care Coordination needs related to needed DME/Supplies in a patient with CHF, HTN, HLD, COPD, DMII, and CKD Stage 3 and polymyalgia rheumatica. . Care Coordination needs related to securing w/c  in a patient with CHF, HTN, HLD, COPD, DMII, and CKD Stage 3  and polymyalgia rheumatica.  Nurse Case Manager Clinical Goal(s):  Marland Kitchen Over the next 30-90 days, patient will verbalize understanding of plan to obtain needed DME.  . Over the next 30-90 days, patient will work with care guide and/or  in network DME supplier  to address needs related to DME needs. . Over the next 30-90 days, patient will verbalize understanding of plan to work with health care team to secure a w/c.   Interventions:  . Inter-disciplinary care team collaboration (see longitudinal plan of care) . Collaboration with/confirmation of receipt of DME orders at in network DME supplier . Will message provider for order for w/c . Care Guide referral for assistance with securing patient a w/c . 10/13/19- Called patient to tell her this CCM RN has asked provider to document the need for wheelchair during telehealth visit on 10/16/19 . 10/16/19- Provided Dr Evette Doffing with the qualifying criteria and documentation needed to support patient's need for manual w/c . Will notify [employer] representatives when documentation for manual w/c is completed by Dr. Evette Doffing   Patient Self Care Activities:  . Patient verbalizes understanding of plan to work with health care team in obtaining a w/c . Self administers medications as prescribed . Attends all scheduled provider appointments . Calls pharmacy for medication refills . Performs ADL's independently . Performs IADL's independently . Calls provider office for new concerns or questions . Unable to independently secure w/c to use when outside of home.   Please see past updates related to this goal by clicking on the "Past Updates" button in the selected goal          Plan:   The care management team will reach out to the patient again over the next 30-60 days.    Kelli Churn RN, CCM, [facility] RN Care Manager [phone]

## 2019-10-16 NOTE — Addendum Note (Signed)
Addended by: Lalla Brothers T on: 10/16/2019 01:38 PM   Modules accepted: Orders

## 2019-10-16 NOTE — Assessment & Plan Note (Signed)
Monitors blood pressure at home and seems to be doing well.  On carvedilol 25 and amlodipine 5 mg daily.  Recently restarted chlorthalidone 25 and lisinopril 40 mg daily as well, those have been previously held during hospitalization.  She is going to check blood pressures at home and let me know if she has any low blood pressures.

## 2019-10-16 NOTE — Progress Notes (Signed)
Internal Medicine Clinic Attending  CCM services provided by the care management provider and their documentation were discussed with Dr. Wynetta Emery. We reviewed the pertinent findings, urgent action items addressed by the resident and non-urgent items to be addressed by the PCP.  I agree with the assessment, diagnosis, and plan of care documented in the CCM and resident's note.  Axel Filler, MD 10/16/2019

## 2019-10-16 NOTE — Telephone Encounter (Signed)
   SF 10/16/2019   Name: Caitlyn Aguirre   MRN: [id number]   DOB: 09-02-1935   AGE: 84 y.o.   GENDER: female   PCP Axel Filler, MD.   Called pt regarding Community Resource Referral for Medicaid assistance and medical equipment. [name] stated that she is unsure why she is receiving calls regarding assistance with applying for Medicaid. Patient stated that she has really been trying to receive assistance with getting a wheelchair. [name] stated that it could be possible that her provider thinks she should apply for Medicaid to get the wheelchair, but she is unsure on how the process works. Care Guide will reach out to office to see if the patient still needs to apply for Medicaid and what the patient needs to do to receive a wheelchair.   Follow up on: 10/17/2019  [place], Care Management Phone: [phone] Email: [email or web address]

## 2019-10-16 NOTE — Assessment & Plan Note (Addendum)
Symptoms of stiffness and weakness in her proximal muscles has returned since coming off the prednisone.  Normally I would do a prolonged course of very low-dose prednisone, however as a complication of treatment she had recurrent skin and soft tissue infections with perineal abscess.  So now we are totally off prednisone and unfortunately I do not think we have much medical therapy to offer for her PMR.  She is having difficulty with walking and completing activities of daily living, especially difficulties with walking outside the house.  Going to order a manual wheelchair to improve her mobility both in the house and outside the house. Patient has mobility limitations which cannot be resolved with a cane, crutch, or walker, and she can safely self-propel the wheelchair as well as has a caregiver who is willing and able to assist at all times.

## 2019-10-16 NOTE — Assessment & Plan Note (Signed)
Symptomatically doing well, completed a course of doxycycline.  Followed up with surgery and had incision and drainage in the office about 2 weeks ago.  Reports pain is much improved.  Now off prednisone for about a month, I am hopeful that the skin and soft tissue infections will stop now that she is off the prednisone.
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
## 2019-10-17 ENCOUNTER — Ambulatory Visit: Payer: Medicare Other | Admitting: *Deleted

## 2019-10-17 ENCOUNTER — Telehealth: Payer: Self-pay | Admitting: *Deleted

## 2019-10-17 DIAGNOSIS — N1832 Chronic kidney disease, stage 3b: Secondary | ICD-10-CM

## 2019-10-17 DIAGNOSIS — I10 Essential (primary) hypertension: Secondary | ICD-10-CM

## 2019-10-17 DIAGNOSIS — E1169 Type 2 diabetes mellitus with other specified complication: Secondary | ICD-10-CM

## 2019-10-17 DIAGNOSIS — M353 Polymyalgia rheumatica: Secondary | ICD-10-CM

## 2019-10-17 NOTE — Progress Notes (Signed)
Internal Medicine Clinic Attending  CCM services provided by the care management provider and their documentation were discussed with Dr. Wynetta Emery. We reviewed the pertinent findings, urgent action items addressed by the resident and non-urgent items to be addressed by the PCP.  I agree with the assessment, diagnosis, and plan of care documented in the CCM and resident's note.  Gilles Chiquito, MD 10/17/2019

## 2019-10-17 NOTE — Progress Notes (Signed)
Internal Medicine Clinic Resident  I have personally reviewed this encounter including the documentation in this note and/or discussed this patient with the care management provider. I will address any urgent items identified by the care management provider and will communicate my actions to the patient's PCP. I have reviewed the patient's CCM visit with my supervising attending, Dr Mullen.  Matt Arick Mareno, MD 10/17/2019   

## 2019-10-17 NOTE — Telephone Encounter (Signed)
Theophilus Bones, RN; Sandi Raveling, Charlotte Sanes   Thanks Lauren!   Marcie Bal sent this in yesterday. It's already being processed.

## 2019-10-17 NOTE — Telephone Encounter (Signed)
CM sent to Skeet Latch at [facility] for manual w/c. F2F was yesterday. Hubbard Hartshorn, BSN, RN-BC

## 2019-10-17 NOTE — Patient Instructions (Addendum)
Visit Information Someone from [employer] will contact you about the manual wheelchair. I have asked the [place] to call you back about assisting you with applying for Medicaid.  Goals Addressed              This Visit's Progress     Patient Stated   .  " I need a refill on my red inhaler." (pt-stated)        CARE PLAN ENTRY (see longitudinal plan of care for additional care plan information)  Current Barriers:  Marland Kitchen Knowledge deficit related to basic understanding of how to use inhalers and how inhaled medications work- patient states she no longer gets refills on her "red inhaler" and she doesn't know why- 10/13/19- patient states she is at the drugstore and the pharmacist is telling her the albuterol is not covered    Case Manager Clinical Goal(s):  Over the next 30-60 days, patient will be able to verbalize understanding of COPD action plan and when to seek appropriate levels of medical care   Interventions:   Provided patient with basic verbal COPD education on self care/management/and exacerbation prevention   Reviewed medications and assessed medication taking behavior  Provided instruction about proper use of medications used for management of COPD including inhalers  Discussed difference between maintenance (Spiriva)and rescue medications (albuterol inhaler and nebulizer)  10/16/19 received message from Dr Evette Doffing that he "changed the brand name of the albuterol from Proair to Proventil. Hopefully that helps with insurance coverage. " after the clinic  telehealth visit with patient on 10/16/19  Patient Self Care Activities:  . Self administers medications as prescribed . Attends all scheduled provider appointments . Calls pharmacy for medication refills . Calls provider office for new concerns or questions . Unable to independently obtain refills on albuterol inhaler  Please see past updates related to this goal by clicking on the "Past Updates" button in the  selected goal      .  " I need a wheelchair." (pt-stated)        [facility] (see longitudinal plan of care for additional care plan information)  Current Barriers:  . Care Coordination needs related to needed DME/Supplies in a patient with CHF, HTN, HLD, COPD, DMII, and CKD Stage 3 and polymyalgia rheumatica. . Care Coordination needs related to securing w/c  in a patient with CHF, HTN, HLD, COPD, DMII, and CKD Stage 3 and polymyalgia rheumatica.- spoke with patient via phone to update her on status of manuel w/c  Nurse Case Manager Clinical Goal(s):  Marland Kitchen Over the next 30-90 days, patient will verbalize understanding of plan to obtain needed DME.  . Over the next 30-90 days, patient will work with care guide and/or  in network DME supplier  to address needs related to DME needs. . Over the next 30-90 days, patient will verbalize understanding of plan to work with health care team to secure a w/c.   Interventions:  . Inter-disciplinary care team collaboration (see longitudinal plan of care) . Collaboration with/confirmation of receipt of DME orders at in network DME supplier- received return community message from Skeet Latch on 9/13 with [employer] that order for manual w/c is being processed . Advised patient by phone on 9/14 that someone from [place], the [place] contracted with [facility]- Medicare will be reaching out to her about the manual w/c     Patient Self Care Activities:  . Patient verbalizes understanding of plan to work with health care team in obtaining  a w/c . Self administers medications as prescribed . Attends all scheduled provider appointments . Calls pharmacy for medication refills . Performs ADL's independently . Performs IADL's independently . Calls provider office for new concerns or questions . Unable to independently secure w/c to use when outside of home.   Please see past updates related to this goal by clicking on the "Past Updates" button in the selected goal       .  "I don't think I've ever applied for Medicaid" (pt-stated)        CARE PLAN ENTRY (see longitudinal plan of care for additional care plan information)  Current Barriers:  . Financial Constraints. - patient states she sometimes borrows money from younger friends to help cover the cost of her medication copays, she says she doesn't think she has ever applied for Medicaid and would like to proceed with applying- spoke with patient via phone to rediscuss Medicaid application since care guide contacted patient on 9/14 and patient said she needed help with a w/c not a Insurance underwriter  Nurse Case Manager Clinical Goal(s):  Marland Kitchen Over the next 30-60 days, patient will work with care guide to apply for Medicaid  Interventions:  . Inter-disciplinary care team collaboration (see longitudinal plan of care) . Reminded patient of conversation with this CCM RN on 8/2 about applying for Medicaid since she sometimes has to borrow money from friends for her medication copays . Care Guide referral for assistance with applying for Medicaid - sent message to care guide asking her to contact patient again as patient does want to apply for Medicaid  Patient Self Care Activities:  . Self administers medications as prescribed . Attends all scheduled provider appointments . Calls pharmacy for medication refills . Calls provider office for new concerns or questions . Unable to independently apply for Medicaid  Please see past updates related to this goal by clicking on the "Past Updates" button in the selected goal         The patient verbalized understanding of instructions provided today and declined a print copy of patient instruction materials.   The care management team will reach out to the patient again over the next 30-60 days.   Kelli Churn RN, CCM, [facility] RN Care Manager [phone]

## 2019-10-17 NOTE — Chronic Care Management (AMB) (Signed)
Chronic Care Management   Follow Up Note   10/17/2019 Name: Caitlyn Aguirre MRN: [id number] DOB: 09/03/1935  Referred by: Axel Filler, MD Reason for referral : Chronic Care Management (NIDDM, HTN, COPD, CKD, HF, polymyalgia rheumatica)   Caitlyn Aguirre is a 84 y.o. year old female who is a primary care patient of Axel Filler, MD. The CCM team was consulted for assistance with chronic disease management and care coordination needs.    Review of patient status, including review of consultants reports, relevant laboratory and other test results, and collaboration with appropriate care team members and the patient's provider was performed as part of comprehensive patient evaluation and provision of chronic care management services.    SDOH (Social Determinants of Health) assessments performed: No See Care Plan activities for detailed interventions related to [facility])     Outpatient Encounter Medications as of 10/17/2019  Medication Sig Note  . Accu-Chek Softclix Lancets lancets Check 1 time a day as instructed   . acetaminophen (TYLENOL) 325 MG tablet Take 2 tablets (650 mg total) by mouth every 6 (six) hours as needed for mild pain (or Fever >/= 101).   Marland Kitchen albuterol (PROVENTIL HFA) 108 (90 Base) MCG/ACT inhaler Inhale 1 puff into the lungs every 6 (six) hours as needed for wheezing or shortness of breath.   Marland Kitchen amLODipine (NORVASC) 5 MG tablet Take 1 tablet (5 mg total) by mouth daily.   Marland Kitchen aspirin EC 81 MG tablet Take 1 tablet (81 mg total) by mouth daily.   Marland Kitchen atorvastatin (LIPITOR) 40 MG tablet Take 1 tablet (40 mg total) by mouth daily.   . Blood Glucose Monitoring Suppl (ACCU-CHEK GUIDE ME) w/Device KIT Check 1 times a day as instructed 09/04/2019: Checks her blood sugar prn  . carvedilol (COREG) 25 MG tablet TAKE 1 TABLET BY MOUTH  TWICE DAILY WITH A MEAL   . chlorthalidone (HYGROTON) 25 MG tablet Take 25 mg by mouth daily.   Marland Kitchen glucose blood (ACCU-CHEK GUIDE) test strip  Check blood sugar 1 time per day   . latanoprost (XALATAN) 0.005 % ophthalmic solution Place 1 drop into both eyes at bedtime.   Marland Kitchen levothyroxine (SYNTHROID) 88 MCG tablet TAKE 1 TABLET BY MOUTH  DAILY BEFORE BREAKFAST   . lisinopril (ZESTRIL) 40 MG tablet    . metFORMIN (GLUCOPHAGE) 1000 MG tablet TAKE 1 TABLET BY MOUTH  TWICE DAILY WITH A MEAL   . omeprazole (PRILOSEC) 20 MG capsule TAKE 1 CAPSULE BY MOUTH  DAILY   . OXYGEN Inhale 2 L into the lungs daily as needed (for breathing).  09/04/2019: States she uses at night  . PARoxetine (PAXIL) 30 MG tablet Take 1 tablet (30 mg total) by mouth daily.   . ramelteon (ROZEREM) 8 MG tablet Take 1 tablet (8 mg total) by mouth at bedtime. 07/15/2019: Lf 06/14/19 for 30 days  . SPIRIVA HANDIHALER 18 MCG inhalation capsule INHALE THE CONTENTS OF 1  CAPSULE BY MOUTH VIA  HANDIHALER DAILY (Patient taking differently: Place 18 mcg into inhaler and inhale daily as needed (for wheezing or shortness of breath.). ) 09/04/2019: Reviewed with patient that this medicine is to be used daily not prn   No facility-administered encounter medications on file as of 10/17/2019.     Objective:  Wt Readings from Last 3 Encounters:  10/02/19 196 lb 9.6 oz (89.2 kg)  08/21/19 200 lb 3.2 oz (90.8 kg)  07/31/19 207 lb 3.2 oz (94 kg)   BP Readings from Last  3 Encounters:  10/02/19 (!) 99/46  08/21/19 112/61  07/31/19 130/67     Goals Addressed              This Visit's Progress     Patient Stated   .  " I need a refill on my red inhaler." (pt-stated)        CARE PLAN ENTRY (see longitudinal plan of care for additional care plan information)  Current Barriers:  Marland Kitchen Knowledge deficit related to basic understanding of how to use inhalers and how inhaled medications work- patient states she no longer gets refills on her "red inhaler" and she doesn't know why- 10/13/19- patient states she is at the drugstore and the pharmacist is telling her the albuterol is not covered      Case Manager Clinical Goal(s):  Over the next 30-60 days, patient will be able to verbalize understanding of COPD action plan and when to seek appropriate levels of medical care   Interventions:   Provided patient with basic verbal COPD education on self care/management/and exacerbation prevention   Reviewed medications and assessed medication taking behavior  Provided instruction about proper use of medications used for management of COPD including inhalers  Discussed difference between maintenance (Spiriva)and rescue medications (albuterol inhaler and nebulizer)  10/16/19 received message from Dr Evette Doffing that he "changed the brand name of the albuterol from Proair to Proventil. Hopefully that helps with insurance coverage. " after the clinic  telehealth visit with patient on 10/16/19  Patient Self Care Activities:  . Self administers medications as prescribed . Attends all scheduled provider appointments . Calls pharmacy for medication refills . Calls provider office for new concerns or questions . Unable to independently obtain refills on albuterol inhaler  Please see past updates related to this goal by clicking on the "Past Updates" button in the selected goal      .  " I need a wheelchair." (pt-stated)        [place] (see longitudinal plan of care for additional care plan information)  Current Barriers:  . Care Coordination needs related to needed DME/Supplies in a patient with CHF, HTN, HLD, COPD, DMII, and CKD Stage 3 and polymyalgia rheumatica. . Care Coordination needs related to securing w/c  in a patient with CHF, HTN, HLD, COPD, DMII, and CKD Stage 3 and polymyalgia rheumatica.- spoke with patient via phone to update her on status of manuel w/c  Nurse Case Manager Clinical Goal(s):  Marland Kitchen Over the next 30-90 days, patient will verbalize understanding of plan to obtain needed DME.  . Over the next 30-90 days, patient will work with care guide and/or  in network DME  supplier  to address needs related to DME needs. . Over the next 30-90 days, patient will verbalize understanding of plan to work with health care team to secure a w/c.   Interventions:  . Inter-disciplinary care team collaboration (see longitudinal plan of care) . Collaboration with/confirmation of receipt of DME orders at in network DME supplier- received return community message from Skeet Latch on 9/13 with [employer] that order for manual w/c is being processed . Advised patient by phone on 9/14 that someone from [place], the [place] contracted with [facility]- Medicare will be reaching out to her about the manual w/c     Patient Self Care Activities:  . Patient verbalizes understanding of plan to work with health care team in obtaining a w/c . Self administers medications as prescribed . Attends all scheduled provider appointments .  Calls pharmacy for medication refills . Performs ADL's independently . Performs IADL's independently . Calls provider office for new concerns or questions . Unable to independently secure w/c to use when outside of home.   Please see past updates related to this goal by clicking on the "Past Updates" button in the selected goal      .  "I don't think I've ever applied for Medicaid" (pt-stated)        CARE PLAN ENTRY (see longitudinal plan of care for additional care plan information)  Current Barriers:  . Financial Constraints. - patient states she sometimes borrows money from younger friends to help cover the cost of her medication copays, she says she doesn't think she has ever applied for Medicaid and would like to proceed with applying- spoke with patient via phone to rediscuss Medicaid application since care guide contacted patient on 9/14 and patient said she needed help with a w/c not a Insurance underwriter  Nurse Case Manager Clinical Goal(s):  Marland Kitchen Over the next 30-60 days, patient will work with care guide to apply for Medicaid  Interventions:   . Inter-disciplinary care team collaboration (see longitudinal plan of care) . Reminded patient of conversation with this CCM RN on 8/2 about applying for Medicaid since she sometimes has to borrow money from friends for her medication copays . Care Guide referral for assistance with applying for Medicaid - sent message to care guide asking her to contact patient again as patient does want to apply for Medicaid  Patient Self Care Activities:  . Self administers medications as prescribed . Attends all scheduled provider appointments . Calls pharmacy for medication refills . Calls provider office for new concerns or questions . Unable to independently apply for Medicaid  Please see past updates related to this goal by clicking on the "Past Updates" button in the selected goal          Plan:   The care management team will reach out to the patient again over the next 30-60 days.    Kelli Churn RN, CCM, [facility] RN Care Manager [phone]

## 2019-10-18 NOTE — Telephone Encounter (Signed)
   [facility] 10/18/2019   Name: Caitlyn Aguirre   MRN: [id number]   DOB: 30-Apr-1935   AGE: 84 y.o.   GENDER: female   PCP Axel Filler, MD.   Called pt regarding Community Resource Referral for Medicaid assistance. [name] stated that she still wants to apply for Medicaid, but she would like for the application to be sent to her in the mail. She stated that she will get her daughter Caitlyn Aguirre to assist her with completing it and turn it in to [place]. Asked patient if she has any additional needs at this time, patient stated she does not. Care Guide will sent resource letter and Medicaid application to patient.    Follow up on: 10/19/2019  [place], Care Management Phone: [phone] Email: [email or web address]

## 2019-10-19 NOTE — Telephone Encounter (Signed)
   [facility] 10/19/2019   Name: Caitlyn Aguirre   MRN: [id number]   DOB: 1935/12/26   AGE: 84 y.o.   GENDER: female   PCP Axel Filler, MD.   Spoke with representative from [place] today to see if their office can mail a Medicaid application to Caitlyn Aguirre. Representative stated that they could and it will be mailed out today. Called patient to let her know. Patient stated understanding and that she will be looking for information in the mail. No additional needs at this time.   Closing referral pending any other needs of patient  Follow up on: 10/19/2019  [place], Care Management Phone: [phone] Email: [email or web address]

## 2019-10-23 NOTE — Addendum Note (Signed)
Addended by: Truddie Crumble on: 10/23/2019 11:45 AM   Modules accepted: Orders

## 2019-10-26 DIAGNOSIS — M353 Polymyalgia rheumatica: Secondary | ICD-10-CM | POA: Diagnosis not present

## 2019-11-03 DIAGNOSIS — J449 Chronic obstructive pulmonary disease, unspecified: Secondary | ICD-10-CM | POA: Diagnosis not present

## 2019-11-03 DIAGNOSIS — G4733 Obstructive sleep apnea (adult) (pediatric): Secondary | ICD-10-CM | POA: Diagnosis not present

## 2019-11-10 ENCOUNTER — Ambulatory Visit: Payer: Medicare Other | Admitting: *Deleted

## 2019-11-10 DIAGNOSIS — N1832 Chronic kidney disease, stage 3b: Secondary | ICD-10-CM

## 2019-11-10 DIAGNOSIS — M353 Polymyalgia rheumatica: Secondary | ICD-10-CM

## 2019-11-10 DIAGNOSIS — E1169 Type 2 diabetes mellitus with other specified complication: Secondary | ICD-10-CM

## 2019-11-10 DIAGNOSIS — I10 Essential (primary) hypertension: Secondary | ICD-10-CM

## 2019-11-10 NOTE — Progress Notes (Addendum)
Internal Medicine Clinic Resident  I have personally reviewed this encounter including the documentation in this note and/or discussed this patient with the care management provider. I will address any urgent items identified by the care management provider and will communicate my actions to the patient's PCP. I have reviewed the patient's CCM visit with my supervising attending, Dr Jimmye Norman.  Harvie Heck, MD  [facility] PGY-2 11/10/2019    Internal Medicine Attending attestation: I have reviewed and agree with the documented assessment and plan of this CCM visit.   Dorian Pod, MD

## 2019-11-10 NOTE — Chronic Care Management (AMB) (Signed)
Chronic Care Management   Follow Up Note   11/10/2019 Name: Caitlyn Aguirre MRN: [id number] DOB: [birth year]  Referred by: Axel Filler, MD Reason for referral : Chronic Care Management (NIDDM, HTN, COPD, CKD, HF, polymyalgia rheumatica)   Caitlyn Aguirre is a 84 y.o. year old female who is a primary care patient of Axel Filler, MD. The CCM team was consulted for assistance with chronic disease management and care coordination needs.    Review of patient status, including review of consultants reports, relevant laboratory and other test results, and collaboration with appropriate care team members and the patient's provider was performed as part of comprehensive patient evaluation and provision of chronic care management services.    SDOH (Social Determinants of Health) assessments performed: No See Care Plan activities for detailed interventions related to [facility])     Outpatient Encounter Medications as of 11/10/2019  Medication Sig Note  . Accu-Chek Softclix Lancets lancets Check 1 time a day as instructed   . acetaminophen (TYLENOL) 325 MG tablet Take 2 tablets (650 mg total) by mouth every 6 (six) hours as needed for mild pain (or Fever >/= 101).   Marland Kitchen albuterol (PROVENTIL HFA) 108 (90 Base) MCG/ACT inhaler Inhale 1 puff into the lungs every 6 (six) hours as needed for wheezing or shortness of breath.   Marland Kitchen amLODipine (NORVASC) 5 MG tablet Take 1 tablet (5 mg total) by mouth daily.   Marland Kitchen aspirin EC 81 MG tablet Take 1 tablet (81 mg total) by mouth daily.   Marland Kitchen atorvastatin (LIPITOR) 40 MG tablet Take 1 tablet (40 mg total) by mouth daily.   . Blood Glucose Monitoring Suppl (ACCU-CHEK GUIDE ME) w/Device KIT Check 1 times a day as instructed 09/04/2019: Checks her blood sugar prn  . carvedilol (COREG) 25 MG tablet TAKE 1 TABLET BY MOUTH  TWICE DAILY WITH A MEAL   . chlorthalidone (HYGROTON) 25 MG tablet Take 25 mg by mouth daily.   Marland Kitchen glucose blood (ACCU-CHEK GUIDE) test strip  Check blood sugar 1 time per day   . latanoprost (XALATAN) 0.005 % ophthalmic solution Place 1 drop into both eyes at bedtime.   Marland Kitchen levothyroxine (SYNTHROID) 88 MCG tablet TAKE 1 TABLET BY MOUTH  DAILY BEFORE BREAKFAST   . lisinopril (ZESTRIL) 40 MG tablet    . metFORMIN (GLUCOPHAGE) 1000 MG tablet TAKE 1 TABLET BY MOUTH  TWICE DAILY WITH A MEAL   . omeprazole (PRILOSEC) 20 MG capsule TAKE 1 CAPSULE BY MOUTH  DAILY   . OXYGEN Inhale 2 L into the lungs daily as needed (for breathing).  09/04/2019: States she uses at night  . PARoxetine (PAXIL) 30 MG tablet Take 1 tablet (30 mg total) by mouth daily.   . ramelteon (ROZEREM) 8 MG tablet Take 1 tablet (8 mg total) by mouth at bedtime. 07/15/2019: Lf 06/14/19 for 30 days  . SPIRIVA HANDIHALER 18 MCG inhalation capsule INHALE THE CONTENTS OF 1  CAPSULE BY MOUTH VIA  HANDIHALER DAILY (Patient taking differently: Place 18 mcg into inhaler and inhale daily as needed (for wheezing or shortness of breath.). ) 09/04/2019: Reviewed with patient that this medicine is to be used daily not prn   No facility-administered encounter medications on file as of 11/10/2019.     Objective:  Wt Readings from Last 3 Encounters:  10/02/19 196 lb 9.6 oz (89.2 kg)  08/21/19 200 lb 3.2 oz (90.8 kg)  07/31/19 207 lb 3.2 oz (94 kg)   BP Readings from Last  3 Encounters:  10/02/19 (!) 99/46  08/21/19 112/61  07/31/19 130/67   Lab Results  Component Value Date   HGBA1C 9.2 (H) 07/16/2019    Goals Addressed              This Visit's Progress     Patient Stated   .  " I need a refill on my red inhaler." (pt-stated)        CARE PLAN ENTRY (see longitudinal plan of care for additional care plan information)  Current Barriers:  Marland Kitchen Knowledge deficit related to basic understanding of how to use inhalers and how inhaled medications work- patient states she no longer gets refills on her "red inhaler" and she doesn't know why- 10/13/19- patient states she is at the drugstore  and the pharmacist is telling her the albuterol is not covered , 10/8-  patient says she hasn't tried to get her rescue inhaler refilled   Case Manager Clinical Goal(s):  Over the next 30-60 days, patient will be able to verbalize understanding of COPD action plan and when to seek appropriate levels of medical care   Interventions:   Provided patient with basic verbal COPD education on self care/management/and exacerbation prevention   Reviewed medications and assessed medication taking behavior  Provided instruction about proper use of medications used for management of COPD including inhalers  Discussed difference between maintenance (Spiriva)and rescue medications (albuterol inhaler and nebulizer)  10/16/19 received message from Dr Evette Doffing that he "changed the brand name of the albuterol from Proair to Proventil. Hopefully that helps with insurance coverage. " after the clinic  telehealth visit with patient on 10/16/19  11/10/19 Patient advised to call her pharmacy and ask if the Proventil Dr Evette Doffing called in is covered by her health plan. If not, patient was asked to notify this CCM RN.   Patient Self Care Activities:  . Self administers medications as prescribed . Attends all scheduled provider appointments . Calls pharmacy for medication refills . Calls provider office for new concerns or questions . Unable to independently obtain refills on albuterol inhaler  Please see past updates related to this goal by clicking on the "Past Updates" button in the selected goal      .  COMPLETED: " I need a wheelchair." (pt-stated)        [place] (see longitudinal plan of care for additional care plan information)  Current Barriers:  . Care Coordination needs related to needed DME/Supplies in a patient with CHF, HTN, HLD, COPD, DMII, and CKD Stage 3 and polymyalgia rheumatica. . Care Coordination needs related to securing w/c  in a patient with CHF, HTN, HLD, COPD, DMII, and CKD  Stage 3 and polymyalgia rheumatica.- 10/8- patient states she received her manual w/c last week  Nurse Case Manager Clinical Goal(s):  Marland Kitchen Over the next 30-90 days, patient will verbalize understanding of plan to obtain needed DME.  . Over the next 30-90 days, patient will work with care guide and/or  in network DME supplier  to address needs related to DME needs. . Over the next 30-90 days, patient will verbalize understanding of plan to work with health care team to secure a w/c.   Interventions:  . Inter-disciplinary care team collaboration (see longitudinal plan of care) . Verified with patient that manual w/c was delivered to her home    Patient Self Care Activities:  . Patient verbalizes understanding of plan to work with health care team in obtaining a w/c . Self administers medications  as prescribed . Attends all scheduled provider appointments . Calls pharmacy for medication refills . Performs ADL's independently . Performs IADL's independently . Calls provider office for new concerns or questions . Unable to independently secure w/c to use when outside of home.   Please see past updates related to this goal by clicking on the "Past Updates" button in the selected goal      .  COMPLETED: "I have a new sore on my bottom and it really hurts at times" (pt-stated)        [place] (see longitudinal plan of care for additional care plan information)  Current Barriers:  Marland Kitchen Knowledge Deficits related to strategies to promote wound healing - patient says the sore on her bottom is healed  Nurse Case Manager Clinical Goal(s):  Marland Kitchen Over the next 30-60 days, patient will verbalize understanding of plan to implement strategies to promote wound healing . Over the next 30-60 days, patient will take her medications as prescribed and voice adequate relief from wound pain . Over the next 30-60 days, patient's buttock wound will heal completely  Interventions:  . Inter-disciplinary care  team collaboration (see longitudinal plan of care) . Assessed wound healing  Patient Self Care Activities:  . Patient verbalizes understanding of plan to treat wound pain with 600 mg of Ibuprofen . Self administers medications as prescribed- and completed prednisone taper correctly . Does not adhere to prescribed medication regimen  Please see past updates related to this goal by clicking on the "Past Updates" button in the selected goal      .  "Mr [name] said he was going to help me get an alarm button to hang around my neck but he never did." (pt-stated)        [place] (see longitudinal plan of care for additional care plan information)  Current Barriers:  . Care Coordination needs related to personal emergency response system  in a patient with HTN, NIDDM, COPD, HF,CKD  and polymyalgia rheumatica- patient states she needs a replacement catalogue for over the counter items she can buy using her [facility] Medicare monthly allowance so she purchase a personal emergency response system  Nurse Case Manager Clinical Goal(s):  Marland Kitchen Over the next 30 days, patient will work with CCM RN and [facility] [facility] Medicare broker to address needs related to personal emergency response system  Interventions:  . Inter-disciplinary care team collaboration (see longitudinal plan of care) . Obtained contact information for patient's [facility] Medicare broker Alena Bills and patient gave this CCM RN permission to speak with him . 10/06/19 Collaborated with [facility] Medicare broker , Alena Bills at [phone], regarding personal emergency response system- he will contact patient about using her $300.00 per quarter OTC benefit to assist her with securing a personal safety alarm system from the [facility] OTC catalog  . 10/8 -Advised patient to call customer service located on her insurance card and request a replacement over the counter catalogue  Patient Self Care Activities:  . Patient verbalizes understanding of plan to work with CCM RN  and health insurance broker to secure personal emergency response system . Self administers medications as prescribed . Attends all scheduled provider appointments . Calls pharmacy for medication refills . Performs ADL's independently . Performs IADL's independently . Calls provider office for new concerns or questions . Unable to independently secure  personal emergency response system  Please see past updates related to this goal by clicking on the "Past Updates" button in the selected goal  Plan:   The care management team will reach out to the patient again over the next 30-60 days.    Kelli Churn RN, CCM, [facility] RN Care Manager [phone]

## 2019-11-10 NOTE — Patient Instructions (Signed)
Visit Information It was nice speaking with you today. Goals Addressed              This Visit's Progress     Patient Stated   .  " I need a refill on my red inhaler." (pt-stated)        CARE PLAN ENTRY (see longitudinal plan of care for additional care plan information)  Current Barriers:  Marland Kitchen Knowledge deficit related to basic understanding of how to use inhalers and how inhaled medications work- patient states she no longer gets refills on her "red inhaler" and she doesn't know why- 10/13/19- patient states she is at the drugstore and the pharmacist is telling her the albuterol is not covered , 10/8-  patient says she hasn't tried to get her rescue inhaler refilled   Case Manager Clinical Goal(s):  Over the next 30-60 days, patient will be able to verbalize understanding of COPD action plan and when to seek appropriate levels of medical care   Interventions:   Provided patient with basic verbal COPD education on self care/management/and exacerbation prevention   Reviewed medications and assessed medication taking behavior  Provided instruction about proper use of medications used for management of COPD including inhalers  Discussed difference between maintenance (Spiriva)and rescue medications (albuterol inhaler and nebulizer)  10/16/19 received message from Dr Evette Doffing that he "changed the brand name of the albuterol from Proair to Proventil. Hopefully that helps with insurance coverage. " after the clinic  telehealth visit with patient on 10/16/19  11/10/19 Patient advised to call her pharmacy and ask if the Proventil Dr Evette Doffing called in is covered by her health plan. If not, patient was asked to notify this CCM RN.   Patient Self Care Activities:  . Self administers medications as prescribed . Attends all scheduled provider appointments . Calls pharmacy for medication refills . Calls provider office for new concerns or questions . Unable to independently obtain refills on  albuterol inhaler  Please see past updates related to this goal by clicking on the "Past Updates" button in the selected goal      .  COMPLETED: " I need a wheelchair." (pt-stated)        [place] (see longitudinal plan of care for additional care plan information)  Current Barriers:  . Care Coordination needs related to needed DME/Supplies in a patient with CHF, HTN, HLD, COPD, DMII, and CKD Stage 3 and polymyalgia rheumatica. . Care Coordination needs related to securing w/c  in a patient with CHF, HTN, HLD, COPD, DMII, and CKD Stage 3 and polymyalgia rheumatica.- 10/8- patient states she received her manual w/c last week  Nurse Case Manager Clinical Goal(s):  Marland Kitchen Over the next 30-90 days, patient will verbalize understanding of plan to obtain needed DME.  . Over the next 30-90 days, patient will work with care guide and/or  in network DME supplier  to address needs related to DME needs. . Over the next 30-90 days, patient will verbalize understanding of plan to work with health care team to secure a w/c.   Interventions:  . Inter-disciplinary care team collaboration (see longitudinal plan of care) . Verified with patient that manual w/c was delivered to her home    Patient Self Care Activities:  . Patient verbalizes understanding of plan to work with health care team in obtaining a w/c . Self administers medications as prescribed . Attends all scheduled provider appointments . Calls pharmacy for medication refills . Performs ADL's independently . Performs IADL's  independently . Calls provider office for new concerns or questions . Unable to independently secure w/c to use when outside of home.   Please see past updates related to this goal by clicking on the "Past Updates" button in the selected goal      .  COMPLETED: "I have a new sore on my bottom and it really hurts at times" (pt-stated)        [place] (see longitudinal plan of care for additional care plan  information)  Current Barriers:  Marland Kitchen Knowledge Deficits related to strategies to promote wound healing - patient says the sore on her bottom is healed  Nurse Case Manager Clinical Goal(s):  Marland Kitchen Over the next 30-60 days, patient will verbalize understanding of plan to implement strategies to promote wound healing . Over the next 30-60 days, patient will take her medications as prescribed and voice adequate relief from wound pain . Over the next 30-60 days, patient's buttock wound will heal completely  Interventions:  . Inter-disciplinary care team collaboration (see longitudinal plan of care) . Assessed wound healing  Patient Self Care Activities:  . Patient verbalizes understanding of plan to treat wound pain with 600 mg of Ibuprofen . Self administers medications as prescribed- and completed prednisone taper correctly . Does not adhere to prescribed medication regimen  Please see past updates related to this goal by clicking on the "Past Updates" button in the selected goal      .  "Mr [name] said he was going to help me get an alarm button to hang around my neck but he never did." (pt-stated)        [place] (see longitudinal plan of care for additional care plan information)  Current Barriers:  . Care Coordination needs related to personal emergency response system  in a patient with HTN, NIDDM, COPD, HF,CKD  and polymyalgia rheumatica- patient states she needs a replacement catalogue for over the counter items she can buy using her [facility] Medicare monthly allowance so she purchase a personal emergency response system  Nurse Case Manager Clinical Goal(s):  Marland Kitchen Over the next 30 days, patient will work with CCM RN and [facility] Medicare broker to address needs related to personal emergency response system  Interventions:  . Inter-disciplinary care team collaboration (see longitudinal plan of care) . Obtained contact information for patient's [facility] Medicare broker Alena Bills and patient gave  this CCM RN permission to speak with him . 10/06/19 Collaborated with [facility] Medicare broker , Alena Bills at [phone], regarding personal emergency response system- he will contact patient about using her $300.00 per quarter OTC benefit to assist her with securing a personal safety alarm system from the [facility] OTC catalog  . 10/8 -Advised patient to call customer service located on her insurance card and request a replacement over the counter catalogue  Patient Self Care Activities:  . Patient verbalizes understanding of plan to work with CCM Rn and Conservation officer, nature to secure personal emergency response system . Self administers medications as prescribed . Attends all scheduled provider appointments . Calls pharmacy for medication refills . Performs ADL's independently . Performs IADL's independently . Calls provider office for new concerns or questions . Unable to independently secure  personal emergency response system  Please see past updates related to this goal by clicking on the "Past Updates" button in the selected goal         The patient verbalized understanding of instructions provided today and declined a print copy of  patient Paediatric nurse.   The care management team will reach out to the patient again over the next 30-60 days.   Kelli Churn RN, CCM, [facility] RN Care Manager [phone]

## 2019-11-14 ENCOUNTER — Other Ambulatory Visit: Payer: Self-pay

## 2019-11-14 ENCOUNTER — Ambulatory Visit
Admission: EM | Admit: 2019-11-14 | Discharge: 2019-11-14 | Disposition: A | Discharge status: Home / Self Care | Payer: Medicare Other | Attending: Family Medicine | Admitting: Family Medicine

## 2019-11-14 DIAGNOSIS — Z20822 Contact with and (suspected) exposure to covid-19: Secondary | ICD-10-CM | POA: Diagnosis not present

## 2019-11-14 NOTE — Discharge Instructions (Signed)

## 2019-11-14 NOTE — ED Triage Notes (Signed)
Pt requesting covid testing. States was out of [place] last week. Denies sx's.

## 2019-11-15 LAB — NOVEL CORONAVIRUS, NAA: SARS-CoV-2, NAA: NOT DETECTED

## 2019-11-15 LAB — SARS-COV-2, NAA 2 DAY TAT

## 2019-11-25 DIAGNOSIS — M353 Polymyalgia rheumatica: Secondary | ICD-10-CM | POA: Diagnosis not present

## 2019-12-04 DIAGNOSIS — J449 Chronic obstructive pulmonary disease, unspecified: Secondary | ICD-10-CM | POA: Diagnosis not present

## 2019-12-04 DIAGNOSIS — G4733 Obstructive sleep apnea (adult) (pediatric): Secondary | ICD-10-CM | POA: Diagnosis not present

## 2019-12-08 ENCOUNTER — Telehealth: Payer: Medicare Other

## 2019-12-18 ENCOUNTER — Ambulatory Visit: Payer: Medicare Other | Admitting: *Deleted

## 2019-12-18 DIAGNOSIS — E1169 Type 2 diabetes mellitus with other specified complication: Secondary | ICD-10-CM

## 2019-12-18 DIAGNOSIS — N1832 Chronic kidney disease, stage 3b: Secondary | ICD-10-CM

## 2019-12-18 DIAGNOSIS — I10 Essential (primary) hypertension: Secondary | ICD-10-CM

## 2019-12-18 DIAGNOSIS — M353 Polymyalgia rheumatica: Secondary | ICD-10-CM

## 2019-12-18 NOTE — Chronic Care Management (AMB) (Signed)
Chronic Care Management   Follow Up Note   12/18/2019 Name: Caitlyn Aguirre MRN: [id number] DOB: 07/20/1935  Referred by: Axel Filler, MD Reason for referral : Chronic Care Management (NIDDM, HTN, COPD, CKD, HF, polymyalgia rheumatica)   Caitlyn Aguirre is a 84 y.o. year old female who is a primary care patient of Axel Filler, MD. The CCM team was consulted for assistance with chronic disease management and care coordination needs.    Review of patient status, including review of consultants reports, relevant laboratory and other test results, and collaboration with appropriate care team members and the patient's provider was performed as part of comprehensive patient evaluation and provision of chronic care management services.    SDOH (Social Determinants of Health) assessments performed: No See Care Plan activities for detailed interventions related to [facility])     Outpatient Encounter Medications as of 12/18/2019  Medication Sig Note  . Accu-Chek Softclix Lancets lancets Check 1 time a day as instructed   . acetaminophen (TYLENOL) 325 MG tablet Take 2 tablets (650 mg total) by mouth every 6 (six) hours as needed for mild pain (or Fever >/= 101).   Marland Kitchen albuterol (PROVENTIL HFA) 108 (90 Base) MCG/ACT inhaler Inhale 1 puff into the lungs every 6 (six) hours as needed for wheezing or shortness of breath.   Marland Kitchen amLODipine (NORVASC) 5 MG tablet Take 1 tablet (5 mg total) by mouth daily.   Marland Kitchen aspirin EC 81 MG tablet Take 1 tablet (81 mg total) by mouth daily.   Marland Kitchen atorvastatin (LIPITOR) 40 MG tablet Take 1 tablet (40 mg total) by mouth daily.   . Blood Glucose Monitoring Suppl (ACCU-CHEK GUIDE ME) w/Device KIT Check 1 times a day as instructed 09/04/2019: Checks her blood sugar prn  . carvedilol (COREG) 25 MG tablet TAKE 1 TABLET BY MOUTH  TWICE DAILY WITH A MEAL   . chlorthalidone (HYGROTON) 25 MG tablet Take 25 mg by mouth daily.   Marland Kitchen glucose blood (ACCU-CHEK GUIDE) test strip  Check blood sugar 1 time per day   . latanoprost (XALATAN) 0.005 % ophthalmic solution Place 1 drop into both eyes at bedtime.   Marland Kitchen levothyroxine (SYNTHROID) 88 MCG tablet TAKE 1 TABLET BY MOUTH  DAILY BEFORE BREAKFAST   . lisinopril (ZESTRIL) 40 MG tablet    . metFORMIN (GLUCOPHAGE) 1000 MG tablet TAKE 1 TABLET BY MOUTH  TWICE DAILY WITH A MEAL   . omeprazole (PRILOSEC) 20 MG capsule TAKE 1 CAPSULE BY MOUTH  DAILY   . OXYGEN Inhale 2 L into the lungs daily as needed (for breathing).  09/04/2019: States she uses at night  . PARoxetine (PAXIL) 30 MG tablet Take 1 tablet (30 mg total) by mouth daily.   . ramelteon (ROZEREM) 8 MG tablet Take 1 tablet (8 mg total) by mouth at bedtime. 07/15/2019: Lf 06/14/19 for 30 days  . SPIRIVA HANDIHALER 18 MCG inhalation capsule INHALE THE CONTENTS OF 1  CAPSULE BY MOUTH VIA  HANDIHALER DAILY (Patient taking differently: Place 18 mcg into inhaler and inhale daily as needed (for wheezing or shortness of breath.). ) 09/04/2019: Reviewed with patient that this medicine is to be used daily not prn   No facility-administered encounter medications on file as of 12/18/2019.     Objective:  Wt Readings from Last 3 Encounters:  10/02/19 196 lb 9.6 oz (89.2 kg)  08/21/19 200 lb 3.2 oz (90.8 kg)  07/31/19 207 lb 3.2 oz (94 kg)    Goals Addressed  This Visit's Progress     Patient Stated   .  COMPLETED: " I need a refill on my red inhaler." (pt-stated)        CARE PLAN ENTRY (see longitudinal plan of care for additional care plan information)  Current Barriers:  Marland Kitchen Knowledge deficit related to basic understanding of how to use inhalers and how inhaled medications work- patient states she no longer gets refills on her "red inhaler" and she doesn't know why- 10/13/19- patient states she is at the drugstore and the pharmacist is telling her the albuterol is not covered , 10/8-  patient says she hasn't tried to get her rescue inhaler refilled 12/18/19-  patient states she is doing well, denies recent COPD exacerbation, patient states she is now getting the inhaler she wanted refilled without problems and she says she uses her inhaler anytime she goes out of the house which is about once per week   Case Manager Clinical Goal(s):  Over the next 30-60 days, patient will be able to verbalize understanding of COPD action plan and when to seek appropriate levels of medical care   Interventions:   Provided patient with basic verbal COPD education on self care/management/and exacerbation prevention   Reviewed medications and assessed medication taking behavior  Provided instruction about proper use of medications used for management of COPD including inhalers  Discussed difference between maintenance (Spiriva)and rescue medications (albuterol inhaler and nebulizer)  10/16/19 received message from Dr Evette Doffing that he "changed the brand name of the albuterol from Proair to Proventil. Hopefully that helps with insurance coverage. " after the clinic  telehealth visit with patient on 10/16/19  11/10/19 Patient advised to call her pharmacy and ask if the Proventil Dr Evette Doffing called in is covered by her health plan. If not, patient was asked to notify this CCM RN.   12/18/19- spoke with patient and she verified she is getting her inhaler refilled without problems  Patient Self Care Activities:  . Self administers medications as prescribed . Attends all scheduled provider appointments . Calls pharmacy for medication refills . Calls provider office for new concerns or questions . Unable to independently obtain refills on albuterol inhaler  Please see past updates related to this goal by clicking on the "Past Updates" button in the selected goal      .  COMPLETED: "Mr [name] said he was going to help me get an alarm button to hang around my neck but he never did." (pt-stated)        [place] (see longitudinal plan of care for additional care plan  information)  Current Barriers:  . Care Coordination needs related to personal emergency response system  in a patient with HTN, NIDDM, COPD, HF,CKD  and polymyalgia rheumatica- patient states she needs a replacement catalogue for over the counter items she can buy using her [facility] Medicare monthly allowance so she purchase a personal emergency response system 12/18/19 patient states a personal alarm system is not one of the items she can buy with her [facility] Medicare over the counter benefits per a family  member that looked though the catalogue for her  Nurse Case Manager Clinical Goal(s):  Marland Kitchen Over the next 30 days, patient will work with CCM RN and [facility] Medicare broker to address needs related to personal emergency response system  Interventions:  . Inter-disciplinary care team collaboration (see longitudinal plan of care) . Obtained contact information for patient's [facility] Medicare broker Alena Bills and patient gave this CCM RN permission  to speak with him . 10/06/19 Collaborated with [name] Medicare broker , Alena Bills at [phone], regarding personal emergency response system- he will contact patient about using her $300.00 per quarter OTC benefit to assist her with securing a personal safety alarm system from the [facility] OTC catalog  . 10/8 -Advised patient to call customer service located on her insurance card and request a replacement over the counter catalogue . 12/18/19- Assessed status of patient purchasing a personal alarm system with her [facility] Medicare benefits  Patient Self Care Activities:  . Patient verbalizes understanding of plan to work with CCM Rn and Conservation officer, nature to secure personal emergency response system . Self administers medications as prescribed . Attends all scheduled provider appointments . Calls pharmacy for medication refills . Performs ADL's independently . Performs IADL's independently . Calls provider office for new concerns or questions . Unable to independently  secure  personal emergency response system  Please see past updates related to this goal by clicking on the "Past Updates" button in the selected goal        Other   .  Blood Pressure < 140/90        BP Readings from Last 3 Encounters:  11/14/19 (!) 154/78  10/02/19 (!) 99/46  08/21/19 112/61  Not meeting blood pressure treatment targets Voices good medication taking behavior    .  HEMOGLOBIN A1C < 8         Lab Results  Component Value Date   HGBA1C 9.2 (H) 07/16/2019      Not  Meeting A1C target with significant increase in Hgb A1C from 7.1% on 05/08/19, most likely due to Prednisone Rx    .  LDL CALC < 100        Lab Results  Component Value Date   CHOL 188 11/29/2017   HDL 57 11/29/2017   LDLCALC 108 (H) 11/29/2017   TRIG 113 11/29/2017   CHOLHDL 3.3 11/29/2017              Plan:   The care management team will reach out to the patient again over the next 30-60 days.    Kelli Churn RN, CCM, [facility] RN Care Manager [phone]

## 2019-12-18 NOTE — Patient Instructions (Signed)
Visit Information It was nice talking with you today. Goals Addressed              This Visit's Progress     Patient Stated   .  COMPLETED: " I need a refill on my red inhaler." (pt-stated)        CARE PLAN ENTRY (see longitudinal plan of care for additional care plan information)  Current Barriers:  Marland Kitchen Knowledge deficit related to basic understanding of how to use inhalers and how inhaled medications work- patient states she no longer gets refills on her "red inhaler" and she doesn't know why- 10/13/19- patient states she is at the drugstore and the pharmacist is telling her the albuterol is not covered , 10/8-  patient says she hasn't tried to get her rescue inhaler refilled 12/18/19- patient states she is doing well, denies recent COPD exacerbation, patient states she is now getting the inhaler she wanted refilled without problems and she says she uses her inhaler anytime she goes out of the house which is about once per week   Case Manager Clinical Goal(s):  Over the next 30-60 days, patient will be able to verbalize understanding of COPD action plan and when to seek appropriate levels of medical care   Interventions:   Provided patient with basic verbal COPD education on self care/management/and exacerbation prevention   Reviewed medications and assessed medication taking behavior  Provided instruction about proper use of medications used for management of COPD including inhalers  Discussed difference between maintenance (Spiriva)and rescue medications (albuterol inhaler and nebulizer)  10/16/19 received message from Dr Evette Doffing that he "changed the brand name of the albuterol from Proair to Proventil. Hopefully that helps with insurance coverage. " after the clinic  telehealth visit with patient on 10/16/19  11/10/19 Patient advised to call her pharmacy and ask if the Proventil Dr Evette Doffing called in is covered by her health plan. If not, patient was asked to notify this CCM RN.    12/18/19- spoke with patient and she verified she is getting her inhaler refilled without problems  Patient Self Care Activities:  . Self administers medications as prescribed . Attends all scheduled provider appointments . Calls pharmacy for medication refills . Calls provider office for new concerns or questions . Unable to independently obtain refills on albuterol inhaler  Please see past updates related to this goal by clicking on the "Past Updates" button in the selected goal      .  COMPLETED: "Mr [name] said he was going to help me get an alarm button to hang around my neck but he never did." (pt-stated)        [place] (see longitudinal plan of care for additional care plan information)  Current Barriers:  . Care Coordination needs related to personal emergency response system  in a patient with HTN, NIDDM, COPD, HF,CKD  and polymyalgia rheumatica- patient states she needs a replacement catalogue for over the counter items she can buy using her [facility] Medicare monthly allowance so she purchase a personal emergency response system 12/18/19 patient states a personal alarm system is not one of the items she can buy with her [facility] Medicare over the counter benefits per a family  member that looked though the catalogue for her  Nurse Case Manager Clinical Goal(s):  Marland Kitchen Over the next 30 days, patient will work with CCM RN and [facility] Medicare broker to address needs related to personal emergency response system  Interventions:  . Inter-disciplinary care team collaboration (  see longitudinal plan of care) . Obtained contact information for patient's [facility] Medicare broker Alena Bills and patient gave this CCM RN permission to speak with him . 10/06/19 Collaborated with [facility] Medicare broker , Alena Bills at [phone], regarding personal emergency response system- he will contact patient about using her $300.00 per quarter OTC benefit to assist her with securing a personal safety alarm system from  the [facility] OTC catalog  . 10/8 -Advised patient to call customer service located on her insurance card and request a replacement over the counter catalogue . 12/18/19- Assessed status of patient purchasing a personal alarm system with her [facility] Medicare benefits  Patient Self Care Activities:  . Patient verbalizes understanding of plan to work with CCM Rn and Conservation officer, nature to secure personal emergency response system . Self administers medications as prescribed . Attends all scheduled provider appointments . Calls pharmacy for medication refills . Performs ADL's independently . Performs IADL's independently . Calls provider office for new concerns or questions . Unable to independently secure  personal emergency response system  Please see past updates related to this goal by clicking on the "Past Updates" button in the selected goal        Other   .  Blood Pressure < 140/90        BP Readings from Last 3 Encounters:  11/14/19 (!) 154/78  10/02/19 (!) 99/46  08/21/19 112/61  Not meeting blood pressure treatment targets on most recent assessment taken n ED Voices good medication taking behavior    .  HEMOGLOBIN A1C < 8         Lab Results  Component Value Date   HGBA1C 9.2 (H) 07/16/2019      Not  Meeting A1C target with significant increase in Hgb A1C from 7.1% on 05/08/19, most likely due to Prednisone Rx    .  LDL CALC < 100        Lab Results  Component Value Date   CHOL 188 11/29/2017   HDL 57 11/29/2017   LDLCALC 108 (H) 11/29/2017   TRIG 113 11/29/2017   CHOLHDL 3.3 11/29/2017             The patient verbalized understanding of instructions, educational materials, and care plan provided today and agreed to receive a mailed copy of patient instructions, educational materials, and care plan.   The care management team will reach out to the patient again over the next 30-60 days.   Kelli Churn RN, CCM, [facility] RN Care Manager [phone]

## 2019-12-20 NOTE — Progress Notes (Signed)
Internal Medicine Clinic Resident  I have personally reviewed this encounter including the documentation in this note and/or discussed this patient with the care management provider. I will address any urgent items identified by the care management provider and will communicate my actions to the patient's PCP. I have reviewed the patient's CCM visit with my supervising attending, Dr Vincent.  Wiatt Mahabir, MD 12/20/2019  

## 2019-12-20 NOTE — Progress Notes (Signed)
Internal Medicine Clinic Attending  CCM services provided by the care management provider and their documentation were discussed with Dr. Basaraba. We reviewed the pertinent findings, urgent action items addressed by the resident and non-urgent items to be addressed by the PCP.  I agree with the assessment, diagnosis, and plan of care documented in the CCM and resident's note.  Tavi Gaughran Thomas Bela Nyborg, MD 12/20/2019  

## 2019-12-26 DIAGNOSIS — M353 Polymyalgia rheumatica: Secondary | ICD-10-CM | POA: Diagnosis not present

## 2020-01-03 DIAGNOSIS — G4733 Obstructive sleep apnea (adult) (pediatric): Secondary | ICD-10-CM | POA: Diagnosis not present

## 2020-01-03 DIAGNOSIS — J449 Chronic obstructive pulmonary disease, unspecified: Secondary | ICD-10-CM | POA: Diagnosis not present

## 2020-01-16 ENCOUNTER — Other Ambulatory Visit: Payer: Self-pay | Admitting: Student in an Organized Health Care Education/Training Program

## 2020-01-17 ENCOUNTER — Ambulatory Visit: Payer: Medicare Other | Admitting: *Deleted

## 2020-01-17 DIAGNOSIS — I10 Essential (primary) hypertension: Secondary | ICD-10-CM

## 2020-01-17 DIAGNOSIS — M353 Polymyalgia rheumatica: Secondary | ICD-10-CM

## 2020-01-17 DIAGNOSIS — N1832 Chronic kidney disease, stage 3b: Secondary | ICD-10-CM

## 2020-01-17 DIAGNOSIS — J449 Chronic obstructive pulmonary disease, unspecified: Secondary | ICD-10-CM

## 2020-01-17 DIAGNOSIS — E1169 Type 2 diabetes mellitus with other specified complication: Secondary | ICD-10-CM

## 2020-01-17 NOTE — Progress Notes (Signed)
Internal Medicine Clinic Resident  I have personally reviewed this encounter including the documentation in this note and/or discussed this patient with the care management provider. I will address any urgent items identified by the care management provider and will communicate my actions to the patient's PCP. I have reviewed the patient's CCM visit with my supervising attending, Dr Rebeca Alert.  Mitzi Hansen, MD Internal Medicine Resident PGY-2 [facility] Pager: [phone] 01/17/2020 5:06 PM

## 2020-01-17 NOTE — Chronic Care Management (AMB) (Signed)
Chronic Care Management   Follow Up Note   01/17/2020 Name: Caitlyn Aguirre MRN: [id number] DOB: Apr 20, 1935  Referred by: Axel Filler, MD Reason for referral : Chronic Care Management ( NIDDM, HTN, COPD, CKD, HF, polymyalgia rheumatica)   Caitlyn Aguirre is a 84 y.o. year old female who is a primary care patient of Axel Filler, MD. The CCM team was consulted for assistance with chronic disease management and care coordination needs.    Review of patient status, including review of consultants reports, relevant laboratory and other test results, and collaboration with appropriate care team members and the patient's provider was performed as part of comprehensive patient evaluation and provision of chronic care management services.    SDOH (Social Determinants of Health) assessments performed: No See Care Plan activities for detailed interventions related to [facility])     Outpatient Encounter Medications as of 01/17/2020  Medication Sig Note  . Accu-Chek Softclix Lancets lancets Check 1 time a day as instructed   . acetaminophen (TYLENOL) 325 MG tablet Take 2 tablets (650 mg total) by mouth every 6 (six) hours as needed for mild pain (or Fever >/= 101).   Marland Kitchen albuterol (PROVENTIL HFA) 108 (90 Base) MCG/ACT inhaler Inhale 1 puff into the lungs every 6 (six) hours as needed for wheezing or shortness of breath.   Marland Kitchen amLODipine (NORVASC) 5 MG tablet Take 1 tablet (5 mg total) by mouth daily.   Marland Kitchen aspirin EC 81 MG tablet Take 1 tablet (81 mg total) by mouth daily.   Marland Kitchen atorvastatin (LIPITOR) 40 MG tablet Take 1 tablet (40 mg total) by mouth daily.   . Blood Glucose Monitoring Suppl (ACCU-CHEK GUIDE ME) w/Device KIT Check 1 times a day as instructed 09/04/2019: Checks her blood sugar prn  . carvedilol (COREG) 25 MG tablet TAKE 1 TABLET BY MOUTH  TWICE DAILY WITH A MEAL   . chlorthalidone (HYGROTON) 25 MG tablet TAKE 1 TABLET BY MOUTH  DAILY   . glucose blood (ACCU-CHEK GUIDE) test  strip Check blood sugar 1 time per day   . latanoprost (XALATAN) 0.005 % ophthalmic solution Place 1 drop into both eyes at bedtime.   Marland Kitchen levothyroxine (SYNTHROID) 88 MCG tablet TAKE 1 TABLET BY MOUTH  DAILY BEFORE BREAKFAST   . lisinopril (ZESTRIL) 40 MG tablet    . metFORMIN (GLUCOPHAGE) 1000 MG tablet TAKE 1 TABLET BY MOUTH  TWICE DAILY WITH A MEAL   . omeprazole (PRILOSEC) 20 MG capsule TAKE 1 CAPSULE BY MOUTH  DAILY   . OXYGEN Inhale 2 L into the lungs daily as needed (for breathing).  09/04/2019: States she uses at night  . PARoxetine (PAXIL) 30 MG tablet Take 1 tablet (30 mg total) by mouth daily.   . ramelteon (ROZEREM) 8 MG tablet Take 1 tablet (8 mg total) by mouth at bedtime. 07/15/2019: Lf 06/14/19 for 30 days  . SPIRIVA HANDIHALER 18 MCG inhalation capsule INHALE THE CONTENTS OF 1  CAPSULE BY MOUTH VIA  HANDIHALER DAILY (Patient taking differently: Place 18 mcg into inhaler and inhale daily as needed (for wheezing or shortness of breath.). ) 09/04/2019: Reviewed with patient that this medicine is to be used daily not prn   No facility-administered encounter medications on file as of 01/17/2020.     Objective:  Lab Results  Component Value Date   HGBA1C 9.2 (H) 07/16/2019   HGBA1C 7.1 (A) 05/08/2019   HGBA1C 6.8 (A) 11/21/2018   Lab Results  Component Value Date  MICROALBUR 1.52 10/12/2013   LDLCALC 108 (H) 11/29/2017   CREATININE 1.23 (H) 10/02/2019    Goals Addressed              This Visit's Progress     Patient Stated   .  "My daughter checks my blood sugar when I ask her to, I can't stick my finger." (pt-stated)        CARE PLAN ENTRY (see longitudinal plan of care for additional care plan information)  Current Barriers:  . Chronic Disease Management support, education, and care coordination needs related to CHF, HTN, HLD, COPD, DMII, CKD Stage 3, and Depression- spoke with patient to complete follow up assessment , she says she is doing very well, reports no  more issues with sores on her buttocks, says she has all the medications she is prescribed to take, she says her daughter has not checked her blood sugar lately, patient says she cannot stick her finger  Clinical Goal(s) related to CHF, HTN, HLD, COPD, DMII, CKD Stage 3, and Depression:  Over the next  30-60 days, patient will:  . Work with the care management team to address educational, disease management, and care coordination needs  . Begin or continue self health monitoring activities as directed today Measure and record cbg (blood glucose) at least one times daily . Call provider office for new or worsened signs and symptoms Blood glucose findings outside established parameters, Chest pain, Shortness of breath, and New or worsened symptom related to HF, COPD, HTN  or DM . Call care management team with questions or concerns . Verbalize basic understanding of patient centered plan of care established today  Interventions related to CHF, HTN, HLD, COPD, DMII, CKD Stage 3, and Depression:  . Inter-disciplinary care team collaboration (see longitudinal plan of care) . Assessed patient's self management skills in regards to HTN, NIDDM, HLD, COPD and HF . Evaluation of current treatment plan related to HTN, NIDDM, HLD, COPD and HF and patient's adherence to plan as established by provider. . Reviewed medications with patient and assessed medication taking behavior;  if indicated, discussed importance of medication adherence  . Ensured patient has all prescribed medications . Discussed plans with patient for ongoing care management follow up and ensured patient has contact number for CCM team and clinic . Reviewed scheduled/upcoming provider appointments including: none at present . Prior to interviewing patient, reviewed patient status, including review of recent office visit notes, consultants reports, relevant laboratory and other test results, and medications.     Patient Self Care  Activities related to CHF, HTN, HLD, COPD, DMII, CKD Stage 3, and Depression:  . Patient is unable to independently self-manage chronic health conditions  Please see past updates related to this goal by clicking on the "Past Updates" button in the selected goal           Plan:   The care management team will reach out to the patient again over the next 30-60 days.    Kelli Churn RN, CCM, [facility] RN Care Manager [phone]

## 2020-01-17 NOTE — Patient Instructions (Signed)
Visit Information It was nice speaking with you today.  Goals Addressed              This Visit's Progress     Patient Stated   .  "My daughter checks my blood sugar when I ask her to, I can't stick my finger." (pt-stated)        CARE PLAN ENTRY (see longitudinal plan of care for additional care plan information)  Current Barriers:  . Chronic Disease Management support, education, and care coordination needs related to CHF, HTN, HLD, COPD, DMII, CKD Stage 3, and Depression- spoke with patient to complete follow up assessment , she says she is doing very well, has all the medications she is prescribed to take, she says her daughter has not checked her blood sugar lately, patient says she cannot stick her finger  Clinical Goal(s) related to CHF, HTN, HLD, COPD, DMII, CKD Stage 3, and Depression:  Over the next  30-60 days, patient will:  . Work with the care management team to address educational, disease management, and care coordination needs  . Begin or continue self health monitoring activities as directed today Measure and record cbg (blood glucose) at least one times daily . Call provider office for new or worsened signs and symptoms Blood glucose findings outside established parameters, Chest pain, Shortness of breath, and New or worsened symptom related to HF, COPD, HTN  or DM . Call care management team with questions or concerns . Verbalize basic understanding of patient centered plan of care established today  Interventions related to CHF, HTN, HLD, COPD, DMII, CKD Stage 3, and Depression:  . Inter-disciplinary care team collaboration (see longitudinal plan of care) . Assessed patient's self management skills in regards to HTN, NIDDM, HLD, COPD and HF . Evaluation of current treatment plan related to HTN, NIDDM, HLD, COPD and HF and patient's adherence to plan as established by provider. . Reviewed medications with patient and assessed medication taking behavior;  if  indicated, discussed importance of medication adherence  . Ensured patient has all prescribed medications . Discussed plans with patient for ongoing care management follow up and ensured patient has contact number for CCM team and clinic . Reviewed scheduled/upcoming provider appointments including: none at present . Prior to interviewing patient, reviewed patient status, including review of recent office visit notes, consultants reports, relevant laboratory and other test results, and medications.     Patient Self Care Activities related to CHF, HTN, HLD, COPD, DMII, CKD Stage 3, and Depression:  . Patient is unable to independently self-manage chronic health conditions  Please see past updates related to this goal by clicking on the "Past Updates" button in the selected goal         The patient verbalized understanding of instructions, educational materials, and care plan provided today and declined offer to receive copy of patient instructions, educational materials, and care plan.   The care management team will reach out to the patient again over the next 30-60 days.   Kelli Churn RN, CCM, [facility] RN Care Manager [phone]

## 2020-01-18 NOTE — Progress Notes (Signed)
Internal Medicine Clinic Attending  CCM services provided by the care management provider and their documentation were reviewed with Dr. Darrick Meigs.  We reviewed the pertinent findings, urgent action items addressed by the resident and non-urgent items to be addressed by the PCP.  I agree with the assessment, diagnosis, and plan of care documented in the CCM and resident's note.  Oda Kilts, MD 01/18/2020

## 2020-01-25 DIAGNOSIS — M353 Polymyalgia rheumatica: Secondary | ICD-10-CM | POA: Diagnosis not present

## 2020-02-03 DIAGNOSIS — J449 Chronic obstructive pulmonary disease, unspecified: Secondary | ICD-10-CM | POA: Diagnosis not present

## 2020-02-03 DIAGNOSIS — G4733 Obstructive sleep apnea (adult) (pediatric): Secondary | ICD-10-CM | POA: Diagnosis not present

## 2020-02-05 ENCOUNTER — Ambulatory Visit
Admission: EM | Admit: 2020-02-05 | Discharge: 2020-02-05 | Disposition: A | Discharge status: Home / Self Care | Payer: Medicare Other | Attending: Emergency Medicine | Admitting: Emergency Medicine

## 2020-02-05 DIAGNOSIS — J069 Acute upper respiratory infection, unspecified: Secondary | ICD-10-CM

## 2020-02-05 DIAGNOSIS — Z20822 Contact with and (suspected) exposure to covid-19: Secondary | ICD-10-CM

## 2020-02-05 MED ORDER — BENZONATATE 200 MG PO CAPS: 200.0000 mg | ORAL_CAPSULE | Freq: Three times a day (TID) | ORAL | 0 refills | Status: AC | PRN

## 2020-02-05 MED ORDER — LORATADINE 10 MG PO TABS: 10.0000 mg | ORAL_TABLET | Freq: Every day | ORAL | 0 refills | Status: DC

## 2020-02-05 MED ORDER — ALBUTEROL SULFATE HFA 108 (90 BASE) MCG/ACT IN AERS: 1.0000 | INHALATION_SPRAY | Freq: Four times a day (QID) | RESPIRATORY_TRACT | 0 refills | Status: DC | PRN
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
## 2020-02-05 NOTE — Discharge Instructions (Signed)
Covid/flu test pending, monitor my chart for results Rest and fluids Tessalon every 8 hours for cough Daily loratadine/Claritin for congestion and drainage Use albuterol inhaler as needed for shortness of breath and wheezing Continue Spiriva as prescribed Follow-up if symptoms not improving or worsening

## 2020-02-05 NOTE — ED Triage Notes (Signed)
Pt c/o cough, nasal congestion, chills, fever, and body aches since Saturday.

## 2020-02-07 LAB — COVID-19, FLU A+B NAA
Influenza A, NAA: NOT DETECTED
Influenza B, NAA: NOT DETECTED
SARS-CoV-2, NAA: DETECTED — AB

## 2020-02-16 ENCOUNTER — Ambulatory Visit: Payer: Medicare Other | Admitting: *Deleted

## 2020-02-16 DIAGNOSIS — E1169 Type 2 diabetes mellitus with other specified complication: Secondary | ICD-10-CM

## 2020-02-16 DIAGNOSIS — M353 Polymyalgia rheumatica: Secondary | ICD-10-CM

## 2020-02-16 DIAGNOSIS — N1832 Chronic kidney disease, stage 3b: Secondary | ICD-10-CM

## 2020-02-16 DIAGNOSIS — I10 Essential (primary) hypertension: Secondary | ICD-10-CM

## 2020-02-16 DIAGNOSIS — J449 Chronic obstructive pulmonary disease, unspecified: Secondary | ICD-10-CM

## 2020-02-20 NOTE — Chronic Care Management (AMB) (Signed)
Chronic Care Management   CCM RN Visit Note  02/16/2020 Name: Caitlyn Aguirre MRN: [id number] DOB: Jul 06, 1935  Subjective: Caitlyn Aguirre is a 85 y.o. year old female who is a primary care patient of Axel Filler, MD. The care management team was consulted for assistance with disease management and care coordination needs.    Engaged with patient by telephone for follow up visit in response to provider referral for case management and/or care coordination services.   Consent to Services:  The patient was given information about Chronic Care Management services, agreed to services, and gave verbal consent prior to initiation of services.  Please see initial visit note for detailed documentation.   Patient agreed to services and verbal consent obtained.   Assessment: Review of patient past medical history, allergies, medications, health status, including review of consultants reports, laboratory and other test data, was performed as part of comprehensive evaluation and provision of chronic care management services.   SDOH (Social Determinants of Health) assessments and interventions performed:    CCM Care Plan  No Known Allergies  Outpatient Encounter Medications as of 02/16/2020  Medication Sig Note  . Accu-Chek Softclix Lancets lancets Check 1 time a day as instructed   . acetaminophen (TYLENOL) 325 MG tablet Take 2 tablets (650 mg total) by mouth every 6 (six) hours as needed for mild pain (or Fever >/= 101).   Marland Kitchen albuterol (VENTOLIN HFA) 108 (90 Base) MCG/ACT inhaler Inhale 1-2 puffs into the lungs every 6 (six) hours as needed for wheezing or shortness of breath.   Marland Kitchen amLODipine (NORVASC) 5 MG tablet Take 1 tablet (5 mg total) by mouth daily.   Marland Kitchen aspirin EC 81 MG tablet Take 1 tablet (81 mg total) by mouth daily.   Marland Kitchen atorvastatin (LIPITOR) 40 MG tablet Take 1 tablet (40 mg total) by mouth daily.   . Blood Glucose Monitoring Suppl (ACCU-CHEK GUIDE ME) w/Device KIT Check 1 times  a day as instructed 09/04/2019: Checks her blood sugar prn  . carvedilol (COREG) 25 MG tablet TAKE 1 TABLET BY MOUTH  TWICE DAILY WITH A MEAL   . chlorthalidone (HYGROTON) 25 MG tablet TAKE 1 TABLET BY MOUTH  DAILY   . glucose blood (ACCU-CHEK GUIDE) test strip Check blood sugar 1 time per day   . latanoprost (XALATAN) 0.005 % ophthalmic solution Place 1 drop into both eyes at bedtime.   Marland Kitchen levothyroxine (SYNTHROID) 88 MCG tablet TAKE 1 TABLET BY MOUTH  DAILY BEFORE BREAKFAST   . lisinopril (ZESTRIL) 40 MG tablet    . loratadine (CLARITIN) 10 MG tablet Take 1 tablet (10 mg total) by mouth daily.   . metFORMIN (GLUCOPHAGE) 1000 MG tablet TAKE 1 TABLET BY MOUTH  TWICE DAILY WITH A MEAL   . omeprazole (PRILOSEC) 20 MG capsule TAKE 1 CAPSULE BY MOUTH  DAILY   . OXYGEN Inhale 2 L into the lungs daily as needed (for breathing).  09/04/2019: States she uses at night  . PARoxetine (PAXIL) 30 MG tablet Take 1 tablet (30 mg total) by mouth daily.   . ramelteon (ROZEREM) 8 MG tablet Take 1 tablet (8 mg total) by mouth at bedtime. 07/15/2019: Lf 06/14/19 for 30 days  . SPIRIVA HANDIHALER 18 MCG inhalation capsule INHALE THE CONTENTS OF 1  CAPSULE BY MOUTH VIA  HANDIHALER DAILY (Patient taking differently: Place 18 mcg into inhaler and inhale daily as needed (for wheezing or shortness of breath.). ) 09/04/2019: Reviewed with patient that this medicine is to  be used daily not prn   No facility-administered encounter medications on file as of 02/16/2020.    Patient Active Problem List   Diagnosis Date Noted  . Perianal abscess 07/15/2019  . CKD (chronic kidney disease) stage 3, GFR 30-59 ml/min (HCC) 05/08/2019  . High Risk for Falls 07/18/2015  . GERD (gastroesophageal reflux disease) 06/16/2015  . Urinary, incontinence, stress female 05/09/2015  . Insomnia 05/09/2015  . Eczema of hand 06/21/2013  . Preventive measure 02/17/2013  . Obstructive sleep apnea 12/25/2011  . Hypothyroidism 01/21/2006  . Depression  01/21/2006  . Essential hypertension 01/21/2006  . COPD ([place]) 01/21/2006  . Osteoarthritis 01/21/2006  . Polymyalgia rheumatica ([place]) 01/21/2006  . Type 2 diabetes mellitus with other specified complication ([place]) 09/62/8366    Conditions to be addressed/monitored:NIDDM, HTN, COPD, CKD, HF, polymyalgia rheumatica  Care Plan : CCM RN- Chronic disease states of : Diabetes Type 2 (Adult), HF, HTN, COPD, CKD and Polymyalgia Rheumatica  Updates made by Barrington Ellison, RN since 02/20/2020 12:00 AM    Problem: Disease Progression of chornic disease states   Priority: High    Long-Range Goal: Disease Progression Prevented or Minimized   Start Date: 06/19/2020  This Visit's Progress: On track  Priority: High  Note:   CARE PLAN ENTRY (see longitudinal plan of care for additional care plan information)  Current Barriers:  . Chronic Disease Management support, education, and care coordination needs related to CHF, HTN, HLD, COPD, DMII, CKD Stage 3, and Depression- spoke with patient to complete follow up assessment , she says she tested positive for Covid on 02/06/20 at a [facility], she says she had a"rough couple of days" but is now doing OK, she says a nurse form [employer] will complete a home visit on 02/20/20 for her annual wellness exam, reports no more issues with sores on her buttocks, say she has all the medications she is prescribed to take, she says her daughter has not checked her blood sugar lately, patient says she cannot stick her finger  Clinical Goal(s) related to CHF, HTN, HLD, COPD, DMII, CKD Stage 3, and Depression:  Over the next  30-60 days, patient will:  . Work with the care management team to address educational, disease management, and care coordination needs  . Begin or continue self health monitoring activities as directed today Measure and record cbg (blood glucose) at least one times daily . Call provider office for new or worsened signs and  symptoms Blood glucose findings outside established parameters, Chest pain, Shortness of breath, and New or worsened symptom related to HF, COPD, HTN or DM . Call care management team with questions or concerns . Verbalize basic understanding of patient centered plan of care established today  Interventions related to CHF, HTN, HLD, COPD, DMII, CKD Stage 3, and Depression:  . Inter-disciplinary care team collaboration (see longitudinal plan of care) . Assessed patient's self management skills in regards to HTN, NIDDM, HLD, COPD and HF . Evaluation of current treatment plan related to HTN, NIDDM, HLD, COPD and HF and patient's adherence to plan as established by provider. . Assessed patient's recovery from Covid and symptoms requiring MD notification or emergency care . Reviewed medications with patient and assessed medication taking behavior;  if indicated, discussed importance of medication adherence  . Ensured patient has all prescribed medications . Discussed plans with patient for ongoing care management follow up and ensured patient has contact number for CCM team and clinic . Reviewed scheduled/upcoming  provider appointments including: none at present . Prior to interviewing patient, reviewed patient status, including review of recent office visit notes, consultants reports, relevant laboratory and other test results, and medications.     Patient Self Care Activities related to CHF, HTN, HLD, COPD, DMII, CKD Stage 3, and Depression:  . Patient is unable to independently self-manage chronic health conditions . - set target A1C with my provider . Manage my diabetes so that I meet my target A1C . - arrange a ride through an agency 1 week before appointment . - ask family or friend for a ride . - call to cancel if needed . - keep a calendar with appointment dates     Plan:The care management team will reach out to the patient again over the next 30-60 days.   Kelli Churn RN, CCM,  [facility] RN Care Manager [phone]

## 2020-02-20 NOTE — Patient Instructions (Signed)
Visit Information It was nice speaking with you today. Patient Care Plan: CCM RN- Chronic disease states of : Diabetes Type 2 (Adult), HF, HTN, COPD, CKD and Polymyalgia Rheumatica    Problem Identified: Disease Progression of chornic disease states   Priority: High    Long-Range Goal: Disease Progression Prevented or Minimized   Start Date: 06/19/2020  This Visit's Progress: On track  Priority: High  Note:   CARE PLAN ENTRY (see longitudinal plan of care for additional care plan information)  Current Barriers:  . Chronic Disease Management support, education, and care coordination needs related to CHF, HTN, HLD, COPD, DMII, CKD Stage 3, and Depression- spoke with patient to complete follow up assessment , she says she tested positive for Covid on 02/06/20 at a [facility], she says she had a"rough couple of days" but is now doing OK, she says a nurse form [employer] will complete a home visit on 02/20/20 for her annual wellness exam, reports no more issues with sores on her buttocks, say she has all the medications she is prescribed to take, she says her daughter has not checked her blood sugar lately, patient says she cannot stick her finger  Clinical Goal(s) related to CHF, HTN, HLD, COPD, DMII, CKD Stage 3, and Depression:  Over the next  30-60 days, patient will:  . Work with the care management team to address educational, disease management, and care coordination needs  . Begin or continue self health monitoring activities as directed today Measure and record cbg (blood glucose) at least one times daily . Call provider office for new or worsened signs and symptoms Blood glucose findings outside established parameters, Chest pain, Shortness of breath, and New or worsened symptom related to HF, COPD, HTN or DM . Call care management team with questions or concerns . Verbalize basic understanding of patient centered plan of care established today  Interventions related  to CHF, HTN, HLD, COPD, DMII, CKD Stage 3, and Depression:  . Inter-disciplinary care team collaboration (see longitudinal plan of care) . Assessed patient's self management skills in regards to HTN, NIDDM, HLD, COPD and HF . Evaluation of current treatment plan related to HTN, NIDDM, HLD, COPD and HF and patient's adherence to plan as established by provider. . Assessed patient's recovery from Covid and symptoms requiring MD notification or emergency care . Reviewed medications with patient and assessed medication taking behavior;  if indicated, discussed importance of medication adherence  . Ensured patient has all prescribed medications . Discussed plans with patient for ongoing care management follow up and ensured patient has contact number for CCM team and clinic . Reviewed scheduled/upcoming provider appointments including: none at present . Prior to interviewing patient, reviewed patient status, including review of recent office visit notes, consultants reports, relevant laboratory and other test results, and medications.     Patient Self Care Activities related to CHF, HTN, HLD, COPD, DMII, CKD Stage 3, and Depression:  . Patient is unable to independently self-manage chronic health conditions . - set target A1C with my provider . Manage my diabetes so that I meet my target A1C . - arrange a ride through an agency 1 week before appointment . - ask family or friend for a ride . - call to cancel if needed . - keep a calendar with appointment dates     The patient verbalized understanding of instructions, educational materials, and care plan provided today and declined offer to receive copy of patient instructions, educational materials,  and care plan.   The care management team will reach out to the patient again over the next 30-60 days.   Kelli Churn RN, CCM, [facility] RN Care Manager [phone]

## 2020-02-22 DIAGNOSIS — G4733 Obstructive sleep apnea (adult) (pediatric): Secondary | ICD-10-CM | POA: Diagnosis not present

## 2020-02-22 DIAGNOSIS — E119 Type 2 diabetes mellitus without complications: Secondary | ICD-10-CM | POA: Diagnosis not present

## 2020-02-22 DIAGNOSIS — K219 Gastro-esophageal reflux disease without esophagitis: Secondary | ICD-10-CM | POA: Diagnosis not present

## 2020-02-22 DIAGNOSIS — E039 Hypothyroidism, unspecified: Secondary | ICD-10-CM | POA: Diagnosis not present

## 2020-02-22 DIAGNOSIS — N183 Chronic kidney disease, stage 3 unspecified: Secondary | ICD-10-CM | POA: Diagnosis not present

## 2020-02-25 DIAGNOSIS — M353 Polymyalgia rheumatica: Secondary | ICD-10-CM | POA: Diagnosis not present

## 2020-03-05 DIAGNOSIS — G4733 Obstructive sleep apnea (adult) (pediatric): Secondary | ICD-10-CM | POA: Diagnosis not present

## 2020-03-05 DIAGNOSIS — J449 Chronic obstructive pulmonary disease, unspecified: Secondary | ICD-10-CM | POA: Diagnosis not present

## 2020-03-18 ENCOUNTER — Ambulatory Visit: Payer: Medicare Other | Admitting: *Deleted

## 2020-03-18 DIAGNOSIS — N1832 Chronic kidney disease, stage 3b: Secondary | ICD-10-CM

## 2020-03-18 DIAGNOSIS — M353 Polymyalgia rheumatica: Secondary | ICD-10-CM

## 2020-03-18 DIAGNOSIS — J449 Chronic obstructive pulmonary disease, unspecified: Secondary | ICD-10-CM

## 2020-03-18 DIAGNOSIS — E1169 Type 2 diabetes mellitus with other specified complication: Secondary | ICD-10-CM

## 2020-03-18 DIAGNOSIS — I10 Essential (primary) hypertension: Secondary | ICD-10-CM

## 2020-03-18 NOTE — Chronic Care Management (AMB) (Signed)
Chronic Care Management   CCM RN Visit Note  03/18/2020 Name: Caitlyn Aguirre MRN: [id number] DOB: [birth year]  Subjective: Caitlyn Aguirre is a 85 y.o. year old female who is a primary care patient of Axel Filler, MD. The care management team was consulted for assistance with disease management and care coordination needs.    Engaged with patient by telephone for follow up visit in response to provider referral for case management and/or care coordination services.   Consent to Services:  The patient was given information about Chronic Care Management services, agreed to services, and gave verbal consent prior to initiation of services.  Please see initial visit note for detailed documentation.   Patient agreed to services and verbal consent obtained.   Assessment: Review of patient past medical history, allergies, medications, health status, including review of consultants reports, laboratory and other test data, was performed as part of comprehensive evaluation and provision of chronic care management services.   SDOH (Social Determinants of Health) assessments and interventions performed:    CCM Care Plan  No Known Allergies  Outpatient Encounter Medications as of 03/18/2020  Medication Sig Note  . Accu-Chek Softclix Lancets lancets Check 1 time a day as instructed   . acetaminophen (TYLENOL) 325 MG tablet Take 2 tablets (650 mg total) by mouth every 6 (six) hours as needed for mild pain (or Fever >/= 101).   Marland Kitchen albuterol (VENTOLIN HFA) 108 (90 Base) MCG/ACT inhaler Inhale 1-2 puffs into the lungs every 6 (six) hours as needed for wheezing or shortness of breath.   Marland Kitchen amLODipine (NORVASC) 5 MG tablet Take 1 tablet (5 mg total) by mouth daily.   Marland Kitchen aspirin EC 81 MG tablet Take 1 tablet (81 mg total) by mouth daily.   Marland Kitchen atorvastatin (LIPITOR) 40 MG tablet Take 1 tablet (40 mg total) by mouth daily.   . Blood Glucose Monitoring Suppl (ACCU-CHEK GUIDE ME) w/Device KIT Check 1 times  a day as instructed 09/04/2019: Checks her blood sugar prn  . carvedilol (COREG) 25 MG tablet TAKE 1 TABLET BY MOUTH  TWICE DAILY WITH A MEAL   . chlorthalidone (HYGROTON) 25 MG tablet TAKE 1 TABLET BY MOUTH  DAILY   . glucose blood (ACCU-CHEK GUIDE) test strip Check blood sugar 1 time per day   . latanoprost (XALATAN) 0.005 % ophthalmic solution Place 1 drop into both eyes at bedtime.   Marland Kitchen levothyroxine (SYNTHROID) 88 MCG tablet TAKE 1 TABLET BY MOUTH  DAILY BEFORE BREAKFAST   . lisinopril (ZESTRIL) 40 MG tablet    . loratadine (CLARITIN) 10 MG tablet Take 1 tablet (10 mg total) by mouth daily.   . metFORMIN (GLUCOPHAGE) 1000 MG tablet TAKE 1 TABLET BY MOUTH  TWICE DAILY WITH A MEAL   . omeprazole (PRILOSEC) 20 MG capsule TAKE 1 CAPSULE BY MOUTH  DAILY   . OXYGEN Inhale 2 L into the lungs daily as needed (for breathing).  09/04/2019: States she uses at night  . PARoxetine (PAXIL) 30 MG tablet Take 1 tablet (30 mg total) by mouth daily.   . ramelteon (ROZEREM) 8 MG tablet Take 1 tablet (8 mg total) by mouth at bedtime. 07/15/2019: Lf 06/14/19 for 30 days  . SPIRIVA HANDIHALER 18 MCG inhalation capsule INHALE THE CONTENTS OF 1  CAPSULE BY MOUTH VIA  HANDIHALER DAILY (Patient taking differently: Place 18 mcg into inhaler and inhale daily as needed (for wheezing or shortness of breath.). ) 09/04/2019: Reviewed with patient that this medicine is to  be used daily not prn   No facility-administered encounter medications on file as of 03/18/2020.    Patient Active Problem List   Diagnosis Date Noted  . Perianal abscess 07/15/2019  . CKD (chronic kidney disease) stage 3, GFR 30-59 ml/min (HCC) 05/08/2019  . High Risk for Falls 07/18/2015  . GERD (gastroesophageal reflux disease) 06/16/2015  . Urinary, incontinence, stress female 05/09/2015  . Insomnia 05/09/2015  . Eczema of hand 06/21/2013  . Preventive measure 02/17/2013  . Obstructive sleep apnea 12/25/2011  . Hypothyroidism 01/21/2006  . Depression  01/21/2006  . Essential hypertension 01/21/2006  . COPD ([place]) 01/21/2006  . Osteoarthritis 01/21/2006  . Polymyalgia rheumatica ([place]) 01/21/2006  . Type 2 diabetes mellitus with other specified complication ([place]) 82/42/3536    Conditions to be addressed/monitored:NIDDM, HTN, COPD, CKD, HF, polymyalgia rheumatica, + covid 02/06/20  Care Plan : CCM RN- Chronic disease states of : Diabetes Type 2 (Adult), HF, HTN, COPD, CKD and Polymyalgia Rheumatica  Updates made by Barrington Ellison, RN since 03/18/2020 12:00 AM    Problem: Disease Progression of chornic disease states   Priority: High    Long-Range Goal: Disease Progression Prevented or Minimized   Start Date: 06/19/2020  This Visit's Progress: On track  Recent Progress: On track  Priority: High  Note:   CARE PLAN ENTRY (see longitudinal plan of care for additional care plan information)  Current Barriers:  . Chronic Disease Management support, education, and care coordination needs related to CHF, HTN, HLD, COPD, DMII, CKD Stage 3, and Depression- spoke with patient to complete follow up assessment , she says she has fully recovered from Covid that was diagnosed on 02/06/20 at a [facility], she says she had a home visit form a [employer] RN last month and she reports she is also receiving monthly visits from [place] and that she was given scales and asked to weigh and record daily. She says her vitals were good according to both nurses.  She reports no more issues with sores on her buttocks, say she has all the medications she is prescribed to take, she says her daughter has not checked her blood sugar lately as patient will not stick her own finger  Clinical Goal(s) related to CHF, HTN, HLD, COPD, DMII, CKD Stage 3, and Depression:  Over the next  30-60 days, patient will:  . Work with the care management team to address educational, disease management, and care coordination needs  . Begin or continue self health  monitoring activities as directed today Measure and record cbg (blood glucose) at least one times daily . Call provider office for new or worsened signs and symptoms Blood glucose findings outside established parameters, Chest pain, Shortness of breath, and New or worsened symptom related to HF, COPD, HTN or DM . Call care management team with questions or concerns . Verbalize basic understanding of patient centered plan of care established today  Interventions related to CHF, HTN, HLD, COPD, DMII, CKD Stage 3, and Depression:  . Inter-disciplinary care team collaboration (see longitudinal plan of care) . Assessed patient's self management skills in regards to HTN, NIDDM, HLD, COPD and HF . Evaluation of current treatment plan related to HTN, NIDDM, HLD, COPD and HF and patient's adherence to plan as established by provider. . Assessed patient's recovery from Covid and symptoms requiring MD notification or emergency care . Reviewed medications with patient and assessed medication taking behavior;  if indicated, discussed importance of medication adherence  .  Ensured patient has all prescribed medications . Discussed plans with patient for ongoing care management follow up and ensured patient has contact number for CCM team and clinic . Reviewed scheduled/upcoming provider appointments including: none at present . Prior to interviewing patient, reviewed patient status, including review of recent office visit notes, consultants reports, relevant laboratory and other test results, and medications.     Patient Self Care Activities related to CHF, HTN, HLD, COPD, DMII, CKD Stage 3, and Depression:  . Patient is unable to independently self-manage chronic health conditions . - set target A1C with my provider . Manage my diabetes so that I meet my target A1C . - arrange a ride through an agency 1 week before appointment . - ask family or friend for a ride . - call to cancel if needed . - keep a  calendar with appointment dates     Plan:The care management team will reach out to the patient again over the next 30- 60 days.   Kelli Churn RN, CCM, [facility] RN Care Manager [phone]

## 2020-03-18 NOTE — Patient Instructions (Signed)
Visit Information It was nice speaking with you today. PATIENT GOALS: Patient Care Plan: CCM RN- Chronic disease states of : Diabetes Type 2 (Adult), HF, HTN, COPD, CKD and Polymyalgia Rheumatica    Problem Identified: Disease Progression of chornic disease states   Priority: High    Long-Range Goal: Disease Progression Prevented or Minimized   Start Date: 06/19/2020  This Visit's Progress: On track  Recent Progress: On track  Priority: High  Note:   CARE PLAN ENTRY (see longitudinal plan of care for additional care plan information)  Current Barriers:  . Chronic Disease Management support, education, and care coordination needs related to CHF, HTN, HLD, COPD, DMII, CKD Stage 3, and Depression- spoke with patient to complete follow up assessment , she says she has fully recovered from Covid that was diagnosed on 02/06/20 at a [facility], she says she had a home visit form a [employer] RN last month and she reports she is also receiving monthly visits from [place] and that she was given scales and asked to weigh and record daily. She says her vitals were good according to both nurses.  She reports no more issues with sores on her buttocks, say she has all the medications she is prescribed to take, she says her daughter has not checked her blood sugar lately as patient will not stick her own finger  Clinical Goal(s) related to CHF, HTN, HLD, COPD, DMII, CKD Stage 3, and Depression:  Over the next  30-60 days, patient will:  . Work with the care management team to address educational, disease management, and care coordination needs  . Begin or continue self health monitoring activities as directed today Measure and record cbg (blood glucose) at least one times daily . Call provider office for new or worsened signs and symptoms Blood glucose findings outside established parameters, Chest pain, Shortness of breath, and New or worsened symptom related to HF, COPD, HTN or  DM . Call care management team with questions or concerns . Verbalize basic understanding of patient centered plan of care established today  Interventions related to CHF, HTN, HLD, COPD, DMII, CKD Stage 3, and Depression:  . Inter-disciplinary care team collaboration (see longitudinal plan of care) . Assessed patient's self management skills in regards to HTN, NIDDM, HLD, COPD and HF . Evaluation of current treatment plan related to HTN, NIDDM, HLD, COPD and HF and patient's adherence to plan as established by provider. . Assessed patient's recovery from Covid and symptoms requiring MD notification or emergency care . Reviewed medications with patient and assessed medication taking behavior;  if indicated, discussed importance of medication adherence  . Ensured patient has all prescribed medications . Discussed plans with patient for ongoing care management follow up and ensured patient has contact number for CCM team and clinic . Reviewed scheduled/upcoming provider appointments including: none at present . Prior to interviewing patient, reviewed patient status, including review of recent office visit notes, consultants reports, relevant laboratory and other test results, and medications.     Patient Self Care Activities related to CHF, HTN, HLD, COPD, DMII, CKD Stage 3, and Depression:  . Patient is unable to independently self-manage chronic health conditions . - set target A1C with my provider . Manage my diabetes so that I meet my target A1C . - arrange a ride through an agency 1 week before appointment . - ask family or friend for a ride . - call to cancel if needed . - keep a calendar with  appointment dates     The patient verbalized understanding of instructions, educational materials, and care plan provided today and declined offer to receive copy of patient instructions, educational materials, and care plan.   The care management team will reach out to the patient again over  the next 30-60 days.   Kelli Churn RN, CCM, [facility] RN Care Manager [phone]

## 2020-03-19 NOTE — Progress Notes (Signed)
Internal Medicine Clinic Resident  I have personally reviewed this encounter including the documentation in this note and/or discussed this patient with the care management provider. I will address any urgent items identified by the care management provider and will communicate my actions to the patient's PCP. I have reviewed the patient's CCM visit with my supervising attending, Dr Daryll Drown.  Sanjuana Letters, MD 03/19/2020

## 2020-03-20 NOTE — Progress Notes (Signed)
Internal Medicine Clinic Attending  CCM services provided by the care management provider and their documentation were discussed with Dr. Johnney Ou. We reviewed the pertinent findings, urgent action items addressed by the resident and non-urgent items to be addressed by the PCP.  I agree with the assessment, diagnosis, and plan of care documented in the CCM and resident's note.  Gilles Chiquito, MD 03/20/2020

## 2020-03-25 DIAGNOSIS — H52222 Regular astigmatism, left eye: Secondary | ICD-10-CM | POA: Diagnosis not present

## 2020-03-25 DIAGNOSIS — H5213 Myopia, bilateral: Secondary | ICD-10-CM | POA: Diagnosis not present

## 2020-03-25 DIAGNOSIS — H353131 Nonexudative age-related macular degeneration, bilateral, early dry stage: Secondary | ICD-10-CM | POA: Diagnosis not present

## 2020-03-25 DIAGNOSIS — H401131 Primary open-angle glaucoma, bilateral, mild stage: Secondary | ICD-10-CM | POA: Diagnosis not present

## 2020-03-25 DIAGNOSIS — E119 Type 2 diabetes mellitus without complications: Secondary | ICD-10-CM | POA: Diagnosis not present

## 2020-03-25 DIAGNOSIS — H524 Presbyopia: Secondary | ICD-10-CM | POA: Diagnosis not present

## 2020-03-25 DIAGNOSIS — Z961 Presence of intraocular lens: Secondary | ICD-10-CM | POA: Diagnosis not present

## 2020-03-25 LAB — HM DIABETES EYE EXAM

## 2020-03-26 ENCOUNTER — Encounter: Payer: Self-pay | Admitting: *Deleted

## 2020-03-27 DIAGNOSIS — M353 Polymyalgia rheumatica: Secondary | ICD-10-CM | POA: Diagnosis not present

## 2020-03-28 LAB — HEMOGLOBIN A1C: Hemoglobin A1C: 6.1

## 2020-04-01 ENCOUNTER — Encounter: Payer: Self-pay | Admitting: *Deleted

## 2020-04-01 NOTE — Progress Notes (Unsigned)

## 2020-04-02 DIAGNOSIS — J449 Chronic obstructive pulmonary disease, unspecified: Secondary | ICD-10-CM | POA: Diagnosis not present

## 2020-04-02 DIAGNOSIS — G4733 Obstructive sleep apnea (adult) (pediatric): Secondary | ICD-10-CM | POA: Diagnosis not present

## 2020-04-08 NOTE — Progress Notes (Signed)
Things That May Be Affecting Your Health:  Alcohol  Hearing loss  Pain   y Depression y Home Safety  Sexual Health   Diabetes  Lack of physical activity  Stress   Difficulty with daily activities  Loneliness  Tiredness   Drug use  Medicines  Tobacco use  y Occupational hygienist  [employer]   Food choices  Oral Health  Other    [place] : 1. Schedule your next subsequent Medicare Wellness visit in one year 2. Attend all of your regular appointments to address your medical issues 3. Complete the preventative screenings and services   Annual Wellness Visit   Medicare Covered Preventative Screenings and [employer] Men and Women Who How Often Need? Date of Last Service Action  Abdominal Aortic Aneurysm Adults with AAA risk factors Once      Alcohol Misuse and Counseling All Adults Screening once a year if no alcohol misuse. Counseling up to 4 face to face sessions.     Bone Density Measurement  Adults at risk for osteoporosis Once every 2 yrs      Lipid Panel Z13.6 All adults without CV disease Once every 5 yrs       Colorectal Cancer  Stool sample or Colonoscopy All adults 35 and older  Once every year Every 10 years        Depression All Adults Once a year  Today   Diabetes Screening Blood glucose, post glucose load, or GTT Z13.1 All adults at risk Pre-diabetics Once per year Twice per year      Diabetes  Self-Management Training All adults Diabetics 10 hrs first year; 2 hours subsequent years. Requires Copay     Glaucoma Diabetics Family history of glaucoma African Americans 84 yrs + Hispanic Americans 38 yrs + Annually - requires coppay      Hepatitis C Z72.89 or F19.20 High Risk for HCV Born between 1945 and 1965 Annually Once      HIV Z11.4 All adults based on risk Annually btw ages 104 & 64 regardless of risk Annually > 65 yrs if at increased risk      Lung Cancer Screening Asymptomatic adults aged 50-77 with 30  pack yr history and current smoker OR quit within the last 15 yrs Annually Must have counseling and shared decision making documentation before first screen      Medical Nutrition Therapy Adults with  Diabetes Renal disease Kidney transplant within past 3 yrs 3 hours first year; 2 hours subsequent years     Obesity and Counseling All adults Screening once a year Counseling if BMI 30 or higher  Today   Tobacco Use Counseling Adults who use tobacco  Up to 8 visits in one year     Vaccines Z23 Hepatitis B Influenza  Pneumonia  Adults  Once Once every flu season Two different vaccines separated by one year     Next Annual Wellness Visit People with Medicare Every year  Today     Services & Screenings Women Who How Often Need  Date of Last Service Action  Mammogram  Z12.31 Women over 63 One baseline ages 58-39. Annually ager 40 yrs+      Pap tests All women Annually if high risk. Every 2 yrs for normal risk women      Screening for cervical cancer with  Pap (Z01.419 nl or Z01.411abnl) & HPV Z11.51 Women aged 38 to 31 Once every 5 yrs  Screening pelvic and breast exams All women Annually if high risk. Every 2 yrs for normal risk women     Sexually Transmitted Diseases Chlamydia Gonorrhea Syphilis All at risk adults Annually for non pregnant females at increased risk         [place] Men Who How Ofter Need  Date of Last Service Action  Prostate Cancer - DRE & PSA Men over 50 Annually.  DRE might require a copay.        Sexually Transmitted Diseases Syphilis All at risk adults Annually for men at increased risk      Health Maintenance List Health Maintenance  Topic Date Due   COVID-19 Vaccine (1) Never done   INFLUENZA VACCINE  09/03/2019   FOOT EXAM  05/07/2020   HEMOGLOBIN A1C  05/22/2020   TETANUS/TDAP  11/19/2020   OPHTHALMOLOGY EXAM  03/25/2021   DEXA SCAN  Completed   PNA vac Low Risk Adult  Completed   HPV VACCINES  Aged Out    Check on  Covid vaccination status please

## 2020-04-15 ENCOUNTER — Telehealth: Payer: Medicare Other

## 2020-04-16 ENCOUNTER — Ambulatory Visit: Payer: Medicare Other | Admitting: *Deleted

## 2020-04-16 DIAGNOSIS — E1169 Type 2 diabetes mellitus with other specified complication: Secondary | ICD-10-CM

## 2020-04-16 DIAGNOSIS — N1832 Chronic kidney disease, stage 3b: Secondary | ICD-10-CM

## 2020-04-16 DIAGNOSIS — M353 Polymyalgia rheumatica: Secondary | ICD-10-CM

## 2020-04-16 DIAGNOSIS — J449 Chronic obstructive pulmonary disease, unspecified: Secondary | ICD-10-CM

## 2020-04-16 DIAGNOSIS — I10 Essential (primary) hypertension: Secondary | ICD-10-CM

## 2020-04-16 NOTE — Patient Instructions (Signed)
Visit Information It was nice speaking with you today. PATIENT GOALS: Patient Care Plan: CCM RN- Chronic disease states of : Diabetes Type 2 (Adult), HF, HTN, COPD, CKD and Polymyalgia Rheumatica    Problem Identified: Disease Progression of chornic disease states   Priority: High    Long-Range Goal: Disease Progression Prevented or Minimized   Start Date: 06/19/2020  Recent Progress: On track  Priority: High  Note:   CARE PLAN ENTRY (see longitudinal plan of care for additional care plan information)  Current Barriers:  . Chronic Disease Management support, education, and care coordination needs related to CHF, HTN, HLD, COPD, DMII, CKD Stage 3, and Depression- spoke with patient to complete follow up assessment , she says she has fully recovered from Covid that was diagnosed on 02/06/20 at a [facility], she says she continues to receive monthly visits from [place] and they are planning a visit on 04/17/20, says she was given scales by [facility] nurse and asked to weigh and record daily, she says her weight is going up since the dosage of her thyroid medicine was changed last month, she also says she needs a refill called into her local pharmacy [facility] on [address]).  She reports no more issues with sores on her buttocks, she reports good medication taking behavior, she says her daughter has not checked her blood sugar lately as patient will not stick her own finger, she is also asking Dr Evette Doffing to complete a form so she can get a handicapped sticker for her license plate  Clinical Goal(s) related to CHF, HTN, HLD, COPD, DMII, CKD Stage 3, and Depression:  Over the next  30-60 days, patient will:  . Work with the care management team to address educational, disease management, and care coordination needs  . Call provider office for new or worsened signs and symptoms Blood glucose findings outside established parameters, Chest pain, Shortness of breath, and New or worsened  symptom related to HF, COPD, HTN or DM . Call care management team with questions or concerns . Verbalize basic understanding of patient centered plan of care established today  Interventions related to CHF, HTN, HLD, COPD, DMII, CKD Stage 3, and Depression:  . Inter-disciplinary care team collaboration (see longitudinal plan of care) . Assessed patient's self management skills in regards to HTN, NIDDM, HLD, COPD and HF . Evaluation of current treatment plan related to HTN, NIDDM, HLD, COPD and HF and patient's adherence to plan as established by provider. . Assessed patient's recovery from Covid and symptoms requiring MD notification or emergency care . Reviewed medications with patient and assessed medication taking behavior. . Ensured patient has all prescribed medications- messaged provider about need for refill on levothyroxine . Messaged provider regarding patient's request for renewal for handicapped parking sticker for license plate and refill  . Discussed plans with patient for ongoing care management follow up and ensured patient has contact number for CCM team and clinic . Reviewed scheduled/upcoming provider appointments including: telehealth clinic visit on 04/17/20 . Prior to interviewing patient, reviewed patient status, including review of recent office visit notes, consultants reports, relevant laboratory and other test results, and medications.     Patient Self Care Activities related to CHF, HTN, HLD, COPD, DMII, CKD Stage 3, and Depression:  . Patient is unable to independently self-manage chronic health conditions . - set target A1C with my provider . Manage my diabetes so that I meet my target A1C . - arrange a ride through an  agency 1 week before appointment . - ask family or friend for a ride . - call to cancel if needed . - keep a calendar with appointment dates     The patient verbalized understanding of instructions, educational materials, and care plan provided  today and declined offer to receive copy of patient instructions, educational materials, and care plan.   The care management team will reach out to the patient again over the next 30-60 days.   Kelli Churn RN, CCM, [facility] RN Care Manager [phone]

## 2020-04-16 NOTE — Chronic Care Management (AMB) (Signed)
Chronic Care Management   CCM RN Visit Note  04/16/2020 Name: Caitlyn Aguirre MRN: [id number] DOB: [birth year]  Subjective: Caitlyn Aguirre is a 85 y.o. year old female who is a primary care patient of Axel Filler, MD. The care management team was consulted for assistance with disease management and care coordination needs.    Engaged with patient by telephone for follow up visit in response to provider referral for case management and/or care coordination services.   Consent to Services:  The patient was given information about Chronic Care Management services, agreed to services, and gave verbal consent prior to initiation of services.  Please see initial visit note for detailed documentation.   Patient agreed to services and verbal consent obtained.   Assessment: Review of patient past medical history, allergies, medications, health status, including review of consultants reports, laboratory and other test data, was performed as part of comprehensive evaluation and provision of chronic care management services.   SDOH (Social Determinants of Health) assessments and interventions performed:    CCM Care Plan  No Known Allergies  Outpatient Encounter Medications as of 04/16/2020  Medication Sig Note  . Accu-Chek Softclix Lancets lancets Check 1 time a day as instructed   . acetaminophen (TYLENOL) 325 MG tablet Take 2 tablets (650 mg total) by mouth every 6 (six) hours as needed for mild pain (or Fever >/= 101).   Marland Kitchen albuterol (VENTOLIN HFA) 108 (90 Base) MCG/ACT inhaler Inhale 1-2 puffs into the lungs every 6 (six) hours as needed for wheezing or shortness of breath.   Marland Kitchen amLODipine (NORVASC) 5 MG tablet Take 1 tablet (5 mg total) by mouth daily.   Marland Kitchen aspirin EC 81 MG tablet Take 1 tablet (81 mg total) by mouth daily.   Marland Kitchen atorvastatin (LIPITOR) 40 MG tablet Take 1 tablet (40 mg total) by mouth daily.   . Blood Glucose Monitoring Suppl (ACCU-CHEK GUIDE ME) w/Device KIT Check 1 times  a day as instructed 09/04/2019: Checks her blood sugar prn  . carvedilol (COREG) 25 MG tablet TAKE 1 TABLET BY MOUTH  TWICE DAILY WITH A MEAL   . chlorthalidone (HYGROTON) 25 MG tablet TAKE 1 TABLET BY MOUTH  DAILY   . glucose blood (ACCU-CHEK GUIDE) test strip Check blood sugar 1 time per day   . latanoprost (XALATAN) 0.005 % ophthalmic solution Place 1 drop into both eyes at bedtime.   Marland Kitchen levothyroxine (SYNTHROID) 75 MCG tablet TAKE 1 TABLET BY MOUTH EVERY DAY IN THE MORNING ON AN EMPTY STOMACH.   Marland Kitchen lisinopril (ZESTRIL) 40 MG tablet    . loratadine (CLARITIN) 10 MG tablet Take 1 tablet (10 mg total) by mouth daily.   . metFORMIN (GLUCOPHAGE) 1000 MG tablet TAKE 1 TABLET BY MOUTH  TWICE DAILY WITH A MEAL   . omeprazole (PRILOSEC) 20 MG capsule TAKE 1 CAPSULE BY MOUTH  DAILY   . OXYGEN Inhale 2 L into the lungs daily as needed (for breathing).  09/04/2019: States she uses at night  . PARoxetine (PAXIL) 30 MG tablet Take 1 tablet (30 mg total) by mouth daily.   . ramelteon (ROZEREM) 8 MG tablet Take 1 tablet (8 mg total) by mouth at bedtime. 07/15/2019: Lf 06/14/19 for 30 days  . SPIRIVA HANDIHALER 18 MCG inhalation capsule INHALE THE CONTENTS OF 1  CAPSULE BY MOUTH VIA  HANDIHALER DAILY (Patient taking differently: Place 18 mcg into inhaler and inhale daily as needed (for wheezing or shortness of breath.). ) 09/04/2019: Reviewed with patient  that this medicine is to be used daily not prn  . [DISCONTINUED] levothyroxine (SYNTHROID) 88 MCG tablet TAKE 1 TABLET BY MOUTH  DAILY BEFORE BREAKFAST    No facility-administered encounter medications on file as of 04/16/2020.    Patient Active Problem List   Diagnosis Date Noted  . Perianal abscess 07/15/2019  . CKD (chronic kidney disease) stage 3, GFR 30-59 ml/min (HCC) 05/08/2019  . High Risk for Falls 07/18/2015  . GERD (gastroesophageal reflux disease) 06/16/2015  . Urinary, incontinence, stress female 05/09/2015  . Insomnia 05/09/2015  . Eczema of hand  06/21/2013  . Preventive measure 02/17/2013  . Obstructive sleep apnea 12/25/2011  . Hypothyroidism 01/21/2006  . Depression 01/21/2006  . Essential hypertension 01/21/2006  . COPD ([place]) 01/21/2006  . Osteoarthritis 01/21/2006  . Polymyalgia rheumatica ([place]) 01/21/2006  . Type 2 diabetes mellitus with other specified complication ([place]) 65/46/5035    Conditions to be addressed/monitored:NIDDM, HTN, COPD, CKD, HF, polymyalgia rheumatica, + covid 02/06/20  Care Plan : CCM RN- Chronic disease states of : Diabetes Type 2 (Adult), HF, HTN, COPD, CKD and Polymyalgia Rheumatica  Updates made by Barrington Ellison, RN since 04/16/2020 12:00 AM    Problem: Disease Progression of chornic disease states   Priority: High    Long-Range Goal: Disease Progression Prevented or Minimized   Start Date: 06/19/2020  Recent Progress: On track  Priority: High  Note:   CARE PLAN ENTRY (see longitudinal plan of care for additional care plan information)  Current Barriers:  . Chronic Disease Management support, education, and care coordination needs related to CHF, HTN, HLD, COPD, DMII, CKD Stage 3, and Depression- spoke with patient to complete follow up assessment , she says she has fully recovered from Covid that was diagnosed on 02/06/20 at a [facility], she says she continues to receive monthly visits from [place] and they are planning a visit on 04/17/20, says she was given scales by [facility] nurse and asked to weigh and record daily, she says her weight is going up since the dosage of her thyroid medicine was changed last month, she also says she needs a refill called into her local pharmacy [facility] on [address]).  She reports no more issues with sores on her buttocks, she reports good medication taking behavior, she says her daughter has not checked her blood sugar lately as patient will not stick her own finger, she is also asking Dr Evette Doffing to complete a form so she can get a handicapped  sticker for her license plate  Clinical Goal(s) related to CHF, HTN, HLD, COPD, DMII, CKD Stage 3, and Depression:  Over the next  30-60 days, patient will:  . Work with the care management team to address educational, disease management, and care coordination needs  . Call provider office for new or worsened signs and symptoms Blood glucose findings outside established parameters, Chest pain, Shortness of breath, and New or worsened symptom related to HF, COPD, HTN or DM . Call care management team with questions or concerns . Verbalize basic understanding of patient centered plan of care established today  Interventions related to CHF, HTN, HLD, COPD, DMII, CKD Stage 3, and Depression:  . Inter-disciplinary care team collaboration (see longitudinal plan of care) . Assessed patient's self management skills in regards to HTN, NIDDM, HLD, COPD and HF . Evaluation of current treatment plan related to HTN, NIDDM, HLD, COPD and HF and patient's adherence to plan as established by provider. . Assessed patient's  recovery from Covid and symptoms requiring MD notification or emergency care . Reviewed medications with patient and assessed medication taking behavior. . Ensured patient has all prescribed medications- messaged provider about need for refill on levothyroxine . Messaged provider regarding patient's request for renewal for handicapped parking sticker for license plate and refill  . Discussed plans with patient for ongoing care management follow up and ensured patient has contact number for CCM team and clinic . Reviewed scheduled/upcoming provider appointments including: telehealth clinic visit on 04/17/20 . Prior to interviewing patient, reviewed patient status, including review of recent office visit notes, consultants reports, relevant laboratory and other test results, and medications.     Patient Self Care Activities related to CHF, HTN, HLD, COPD, DMII, CKD Stage 3, and Depression:   . Patient is unable to independently self-manage chronic health conditions . - set target A1C with my provider . Manage my diabetes so that I meet my target A1C . - arrange a ride through an agency 1 week before appointment . - ask family or friend for a ride . - call to cancel if needed . - keep a calendar with appointment dates     Plan:The care management team will reach out to the patient again over the next 30-60 days.   Kelli Churn RN, CCM, [facility] RN Care Manager [phone]

## 2020-04-17 ENCOUNTER — Other Ambulatory Visit: Payer: Self-pay

## 2020-04-17 ENCOUNTER — Ambulatory Visit (INDEPENDENT_AMBULATORY_CARE_PROVIDER_SITE_OTHER): Payer: Medicare Other | Admitting: Internal Medicine

## 2020-04-17 ENCOUNTER — Encounter: Payer: Self-pay | Admitting: Internal Medicine

## 2020-04-17 ENCOUNTER — Telehealth: Payer: Self-pay

## 2020-04-17 DIAGNOSIS — Z0000 Encounter for general adult medical examination without abnormal findings: Secondary | ICD-10-CM

## 2020-04-17 MED ORDER — LEVOTHYROXINE SODIUM 75 MCG PO TABS: ORAL_TABLET | ORAL | 3 refills | Status: DC

## 2020-04-17 NOTE — Progress Notes (Signed)
This AWV is being conducted by [place] only. The patient was located at home and I was located in [facility]. The patient's identity was confirmed using their DOB and current address. The patient or his/her legal guardian has consented to being evaluated through a telephone encounter and understands the associated risks (an examination cannot be done and the patient may need to come in for an appointment) / benefits (allows the patient to remain at home, decreasing exposure to coronavirus). I personally spent 40 minutes conducting the AWV.  Subjective:   Caitlyn Aguirre is a 85 y.o. female who presents for a Medicare Annual Wellness Visit.  The following items have been reviewed and updated today in the appropriate area in the EMR.   Health Risk Assessment  Height, weight, BMI, and BP Visual acuity if needed Depression screen Fall risk / safety level Advance directive discussion Medical and family history were reviewed and updated Updating list of other providers & suppliers Medication reconciliation, including over the counter medicines Cognitive screen Written screening schedule Risk Factor list Personalized health advice, risky behaviors, and treatment advice  Social History   Social History Narrative   Current Social History 04/17/2020        Patient lives with her daughter in a home which is 1 story. There are steps up to the entrance, the patient uses a handrail.       Patient's method of transportation is via family member (daughter).      The highest level of education was elementary school (5th grade).      The patient currently retired.      Identified important Relationships are:  Daughter and grandkids       Pets : None       Interests / Fun: Watching T.V. and listening to music       Current Stressors: none      Religious / Personal Beliefs: Holiness , goes to church on Sundays             Objective:    Vitals: There were no vitals taken for this  visit. Vitals are unable to obtained due to NFAOZ-30 public health emergency  Activities of Daily Living In your present state of health, do you have any difficulty performing the following activities: 04/17/2020 10/02/2019  Hearing? Y N  Vision? N N  Comment "Picked up a new pair of glasses Monday" -  Difficulty concentrating or making decisions? Y N  Comment Sometimes remembering things, but not that often -  Walking or climbing stairs? Y Y  Comment uses w/c -  Dressing or bathing? N N  Doing errands, shopping? Y Y  Comment Hard to get around w/ manual w/c -  Some recent data might be hidden    Goals Goals    . Blood Pressure < 140/90     BP Readings from Last 3 Encounters:  11/14/19 (!) 154/78  10/02/19 (!) 99/46  08/21/19 112/61  Not meeting blood pressure treatment targets on most recent assessment taken n [facility] good medication taking behavior    . HEMOGLOBIN A1C < 8      Lab Results  Component Value Date   HGBA1C 9.2 (H) 07/16/2019      Not  Meeting A1C target with significant increase in Hgb A1C from 7.1% on 05/08/19, most likely due to Prednisone Rx    . Increase physical activity     Begin seated and standing exercises with exercise band to increase strength  and balance.     Marland Kitchen LDL CALC < 100     Lab Results  Component Value Date   CHOL 188 11/29/2017   HDL 57 11/29/2017   LDLCALC 108 (H) 11/29/2017   TRIG 113 11/29/2017   CHOLHDL 3.3 11/29/2017          . Make and Keep All Appointments     Timeframe:  Long-Range Goal Priority:  High Start Date:       02/16/20                      Expected End Date:                       Follow Up Date 05/17/20   - arrange a ride through an agency 1 week before appointment - ask family or friend for a ride - call to cancel if needed - keep a calendar with appointment dates    Why is this important?    Part of staying healthy is seeing the doctor for follow-up care.   If you forget your appointments, there are  some things you can do to stay on track.    Notes: 04/16/20- patient states she is now receiving monthly home visits from [facility] so she does not need to see Dr Evette Doffing unless they find a problem they can't handle, she has a telehealth clinic visit on 04/17/20    . Set and Meet My Target A1C -Diabetes Type 2     Timeframe:  Short-Term Goal Priority:  High Start Date:        06/20/19                     Expected End Date:                       Follow Up Date 05/17/20   - set target A1C with my provider -Manage my diabetes so that I meet my target A1C   Why is this important?    Your target A1C is decided together by you and your doctor.   It is based on several things like your age and other health issues.    Notes: Patient does not check her blood sugar- will not prick her finger; she is receiving monthly home visit from [facility]. Most recent Hgb A1C drawn by [facility] on 02/22/20 = 6.1%, improved from 6.8% on 11/21/18       Fall Risk Fall Risk  04/17/2020 10/02/2019 08/21/2019 07/31/2019 07/10/2019  Falls in the past year? 0 0 0 0 0  Number falls in past yr: 0 - - 0 0  Injury with Fall? - - - 0 1  Comment - - - - -  Risk Factor Category  - - - - -  Risk for fall due to : History of fall(s) Impaired mobility No Fall Risks - -  Follow up - Falls prevention discussed Falls prevention discussed - -    Depression Screen PHQ 2/9 Scores 04/17/2020 10/02/2019 08/21/2019 07/31/2019  PHQ - 2 Score 0 2 4 0  PHQ- 9 Score 4 5 9  -  Exception Documentation - - - -  Not completed - - - -     Cognitive Testing Six-Item Cognitive Screener   "I would like to ask you some questions that ask you to use your memory. I am going to name three objects. Please wait until I  say all three words, then repeat them. Remember what they are  because I am going to ask you to name them again in a few minutes. Please repeat these words for me: APPLE--TABLE--PENNY." (Interviewer may repeat names 3 times  if necessary but repetition not scored.)  Did patient correctly repeat all three words? Yes - may proceed with screen  What year is this? Correct What month is this? Correct What day of the week is this? Correct  What were the three objects I asked you to remember? . Apple Correct . Table Correct . Penny Correct  Score one point for each incorrect answer.  A score of 2 or more points warrants additional investigation.  Patient's score 0    PHQ9 = 4      Assessment and Plan:     The patient was educated on the Covid and Influenza vaccine, both were recommended, both vaccines were declined by the patient.  The patient states she walks up and down her driveway daily.  She has a history of falls, although she states it has been over a year since her last fall.  The use of an exercise band when she is in a seated position was encouraged during this AWV.    CDC Handout on Fall Prevention and Handout on Home Exercise Program, Access codes [id number] and [email or web address] given/mailed to patient with exercise band.    During the course of the visit the patient was educated and counseled about appropriate screening and preventive services as documented in the assessment and plan.  The printed AVS was given to the patient and included an updated screening schedule, a list of risk factors, and personalized health advice.        Higinio Roger, RN  04/17/2020

## 2020-04-17 NOTE — Progress Notes (Signed)
[facility] Clinic Attending  CCM services provided by the care management provider and their documentation were discussed with Dr. Gilford Rile. We reviewed the pertinent findings, urgent action items addressed by the resident and non-urgent items to be addressed by the PCP.  I agree with the assessment, diagnosis, and plan of care documented in the CCM and resident's note.  Lucious Groves, DO 04/17/2020

## 2020-04-17 NOTE — Addendum Note (Signed)
Addended by: Maudie Mercury C on: 04/17/2020 11:06 AM   Modules accepted: Orders

## 2020-04-17 NOTE — Patient Instructions (Addendum)
Progress Notes by Axel Filler, MD at 04/01/2020 11:38 AM  Author: Axel Filler, MD Author Type: Physician Filed: 04/08/2020 1:39 PM  Note Status: Signed Cosign: Cosign Not Required Encounter Date: 04/01/2020  Editor: Axel Filler, MD (Physician)             Things That May Be Affecting Your Health:  Alcohol  Hearing loss  Pain   y Depression y Home Safety  Sexual Health   Diabetes  Lack of physical activity  Stress   Difficulty with daily activities  Loneliness  Tiredness   Drug use  Medicines  Tobacco use  y Occupational hygienist  [employer]   Food choices  Oral Health  Other    [place] : 1. Schedule your next subsequent Medicare Wellness visit in one year 2. Attend all of your regular appointments to address your medical issues 3. Complete the preventative screenings and services 4.  Please make a follow up appointment to see Dr. Evette Doffing in May for a foot exam and diabetic follow up. 5.  Begin seated exercises with exercise band to increase strength and balance.    Annual Wellness Visit                       Medicare Covered Preventative Screenings and Services  Services & Screenings Men and Women Who How Often Need? Date of Last Service Action  Abdominal Aortic Aneurysm Adults with AAA risk factors Once      Alcohol Misuse and Counseling All Adults Screening once a year if no alcohol misuse. Counseling up to 4 face to face sessions.     Bone Density Measurement  Adults at risk for osteoporosis Once every 2 yrs      Lipid Panel Z13.6 All adults without CV disease Once every 5 yrs       Colorectal Cancer   Stool sample or  Colonoscopy All adults 29 and older   Once every year  Every 10 years        Depression All Adults Once a year  Today   Diabetes Screening Blood glucose, post glucose load, or GTT Z13.1  All adults at risk  Pre-diabetics  Once per year  Twice  per year      Diabetes  Self-Management Training All adults Diabetics 10 hrs first year; 2 hours subsequent years. Requires Copay     Glaucoma  Diabetics  Family history of glaucoma  African Americans 30 yrs +  Hispanic Americans 14 yrs + Annually - requires coppay      Hepatitis C Z72.89 or F19.20  High Risk for HCV  Born between 1945 and 1965  Annually  Once      HIV Z11.4 All adults based on risk  Annually btw ages 64 & 14 regardless of risk  Annually > 65 yrs if at increased risk      Lung Cancer Screening Asymptomatic adults aged 22-77 with 30 pack yr history and current smoker OR quit within the last 15 yrs Annually Must have counseling and shared decision making documentation before first screen      Medical Nutrition Therapy Adults with   Diabetes  Renal disease  Kidney transplant within past 3 yrs 3 hours first year; 2 hours subsequent years     Obesity and Counseling All adults Screening once a year Counseling if BMI 30 or higher  Today   Tobacco Use Counseling Adults who use tobacco  Up to  8 visits in one year     Vaccines Z23  Hepatitis B  Influenza   Pneumonia  Adults   Once  Once every flu season  Two different vaccines separated by one year     Next Annual Wellness Visit People with Medicare Every year  Today     [place] Women Who How Often Need  Date of Last Service Action  Mammogram  Z12.31 Women over 85 One baseline ages 47-39. Annually ager 40 yrs+      Pap tests All women Annually if high risk. Every 2 yrs for normal risk women      Screening for cervical cancer with   Pap (Z01.419 nl or Z01.411abnl) &  HPV Z11.51 Women aged 4 to 13 Once every 5 yrs     Screening pelvic and breast exams All women Annually if high risk. Every 2 yrs for normal risk women     Sexually Transmitted Diseases  Chlamydia  Gonorrhea  Syphilis All at risk adults Annually for non  pregnant females at increased risk         [place] Men Who How Ofter Need  Date of Last Service Action  Prostate Cancer - DRE & PSA Men over 50 Annually.  DRE might require a copay.        Sexually Transmitted Diseases  Syphilis All at risk adults Annually for men at increased risk      Health Maintenance List     Health Maintenance  Topic Date Due  . COVID-19 Vaccine (1) Never done  . INFLUENZA VACCINE  09/03/2019  . FOOT EXAM  05/07/2020  . HEMOGLOBIN A1C  05/22/2020  . TETANUS/TDAP  11/19/2020  . OPHTHALMOLOGY EXAM  03/25/2021  . DEXA SCAN  Completed  . PNA vac Low Risk Adult  Completed  . HPV VACCINES  Aged Out    Check on Covid vaccination status please         Fall Prevention in the Home, Adult Falls can cause injuries and can happen to people of all ages. There are many things you can do to make your home safe and to help prevent falls. Ask for help when making these changes. What actions can I take to prevent falls? General Instructions  Use good lighting in all rooms. Replace any light bulbs that burn out.  Turn on the lights in dark areas. Use night-lights.  Keep items that you use often in easy-to-reach places. Lower the shelves around your home if needed.  Set up your furniture so you have a clear path. Avoid moving your furniture around.  Do not have throw rugs or other things on the floor that can make you trip.  Avoid walking on wet floors.  If any of your floors are uneven, fix them.  Add color or contrast paint or tape to clearly mark and help you see: ? Grab bars or handrails. ? First and last steps of staircases. ? Where the edge of each step is.  If you use a stepladder: ? Make sure that it is fully opened. Do not climb a closed stepladder. ? Make sure the sides of the stepladder are locked in place. ? Ask someone to hold the stepladder while you use it.  Know where your pets are when moving  through your home. What can I do in the bathroom?  Keep the floor dry. Clean up any water on the floor right away.  Remove soap buildup in the tub or shower.  Use nonskid mats or decals on the floor of the tub or shower.  Attach bath mats securely with double-sided, nonslip rug tape.  If you need to sit down in the shower, use a plastic, nonslip stool.  Install grab bars by the toilet and in the tub and shower. Do not use towel bars as grab bars.      What can I do in the bedroom?  Make sure that you have a light by your bed that is easy to reach.  Do not use any sheets or blankets for your bed that hang to the floor.  Have a firm chair with side arms that you can use for support when you get dressed. What can I do in the kitchen?  Clean up any spills right away.  If you need to reach something above you, use a step stool with a grab bar.  Keep electrical cords out of the way.  Do not use floor polish or wax that makes floors slippery. What can I do with my stairs?  Do not leave any items on the stairs.  Make sure that you have a light switch at the top and the bottom of the stairs.  Make sure that there are handrails on both sides of the stairs. Fix handrails that are broken or loose.  Install nonslip stair treads on all your stairs.  Avoid having throw rugs at the top or bottom of the stairs.  Choose a carpet that does not hide the edge of the steps on the stairs.  Check carpeting to make sure that it is firmly attached to the stairs. Fix carpet that is loose or worn. What can I do on the outside of my home?  Use bright outdoor lighting.  Fix the edges of walkways and driveways and fix any cracks.  Remove anything that might make you trip as you walk through a door, such as a raised step or threshold.  Trim any bushes or trees on paths to your home.  Check to see if handrails are loose or broken and that both sides of all steps have handrails.  Install  guardrails along the edges of any raised decks and porches.  Clear paths of anything that can make you trip, such as tools or rocks.  Have leaves, snow, or ice cleared regularly.  Use sand or salt on paths during winter.  Clean up any spills in your garage right away. This includes grease or oil spills. What other actions can I take?  Wear shoes that: ? Have a low heel. Do not wear high heels. ? Have rubber bottoms. ? Feel good on your feet and fit well. ? Are closed at the toe. Do not wear open-toe sandals.  Use tools that help you move around if needed. These include: ? Canes. ? Walkers. ? Scooters. ? Crutches.  Review your medicines with your doctor. Some medicines can make you feel dizzy. This can increase your chance of falling. Ask your doctor what else you can do to help prevent falls. Where to find more information  Centers for Disease Control and Prevention, STEADI: http://www.wolf.info/  National Institute on Aging: http://kim-miller.com/ Contact a doctor if:  You are afraid of falling at home.  You feel weak, drowsy, or dizzy at home.  You fall at home. Summary  There are many simple things that you can do to make your home safe and to help prevent falls.  Ways to make your home safe include removing things that can  make you trip and installing grab bars in the bathroom.  Ask for help when making these changes in your home. This information is not intended to replace advice given to you by your health care provider. Make sure you discuss any questions you have with your health care provider. Document Revised: 08/23/2019 Document Reviewed: 08/23/2019 Elsevier Patient Education  [place] Maintenance, Female Adopting a healthy lifestyle and getting preventive care are important in promoting health and wellness. Ask your health care provider about:  The right schedule for you to have regular tests and exams.  Things you can do on your own to prevent  diseases and keep yourself healthy. What should I know about diet, weight, and exercise? Eat a healthy diet  Eat a diet that includes plenty of vegetables, fruits, low-fat dairy products, and lean protein.  Do not eat a lot of foods that are high in solid fats, added sugars, or sodium.   Maintain a healthy weight Body mass index (BMI) is used to identify weight problems. It estimates body fat based on height and weight. Your health care provider can help determine your BMI and help you achieve or maintain a healthy weight. Get regular exercise Get regular exercise. This is one of the most important things you can do for your health. Most adults should:  Exercise for at least 150 minutes each week. The exercise should increase your heart rate and make you sweat (moderate-intensity exercise).  Do strengthening exercises at least twice a week. This is in addition to the moderate-intensity exercise.  Spend less time sitting. Even light physical activity can be beneficial. Watch cholesterol and blood lipids Have your blood tested for lipids and cholesterol at 83 years of age, then have this test every 5 years. Have your cholesterol levels checked more often if:  Your lipid or cholesterol levels are high.  You are older than 83 years of age.  You are at high risk for heart disease. What should I know about cancer screening? Depending on your health history and family history, you may need to have cancer screening at various ages. This may include screening for:  Breast cancer.  Cervical cancer.  Colorectal cancer.  Skin cancer.  Lung cancer. What should I know about heart disease, diabetes, and high blood pressure? Blood pressure and heart disease  High blood pressure causes heart disease and increases the risk of stroke. This is more likely to develop in people who have high blood pressure readings, are of African descent, or are overweight.  Have your blood pressure  checked: ? Every 3-5 years if you are 76-63 years of age. ? Every year if you are 19 years old or older. Diabetes Have regular diabetes screenings. This checks your fasting blood sugar level. Have the screening done:  Once every three years after age 59 if you are at a normal weight and have a low risk for diabetes.  More often and at a younger age if you are overweight or have a high risk for diabetes. What should I know about preventing infection? Hepatitis B If you have a higher risk for hepatitis B, you should be screened for this virus. Talk with your health care provider to find out if you are at risk for hepatitis B infection. Hepatitis C Testing is recommended for:  Everyone born from [birth year] through 1963.  Anyone with known risk factors for hepatitis C. Sexually transmitted infections (STIs)  Get screened for STIs, including gonorrhea and chlamydia,  if: ? You are sexually active and are younger than 83 years of age. ? You are older than 83 years of age and your health care provider tells you that you are at risk for this type of infection. ? Your sexual activity has changed since you were last screened, and you are at increased risk for chlamydia or gonorrhea. Ask your health care provider if you are at risk.  Ask your health care provider about whether you are at high risk for HIV. Your health care provider may recommend a prescription medicine to help prevent HIV infection. If you choose to take medicine to prevent HIV, you should first get tested for HIV. You should then be tested every 3 months for as long as you are taking the medicine. Pregnancy  If you are about to stop having your period (premenopausal) and you may become pregnant, seek counseling before you get pregnant.  Take 400 to 800 micrograms (mcg) of folic acid every day if you become pregnant.  Ask for birth control (contraception) if you want to prevent pregnancy. Osteoporosis and menopause Osteoporosis is a  disease in which the bones lose minerals and strength with aging. This can result in bone fractures. If you are 50 years old or older, or if you are at risk for osteoporosis and fractures, ask your health care provider if you should:  Be screened for bone loss.  Take a calcium or vitamin D supplement to lower your risk of fractures.  Be given hormone replacement therapy (HRT) to treat symptoms of menopause. Follow these instructions at home: Lifestyle  Do not use any products that contain nicotine or tobacco, such as cigarettes, e-cigarettes, and chewing tobacco. If you need help quitting, ask your health care provider.  Do not use street drugs.  Do not share needles.  Ask your health care provider for help if you need support or information about quitting drugs. Alcohol use  Do not drink alcohol if: ? Your health care provider tells you not to drink. ? You are pregnant, may be pregnant, or are planning to become pregnant.  If you drink alcohol: ? Limit how much you use to 0-1 drink a day. ? Limit intake if you are breastfeeding.  Be aware of how much alcohol is in your drink. In the [place] one drink equals one 12 oz bottle of beer (355 mL), one 5 oz glass of wine (148 mL), or one 1 oz glass of hard liquor (44 mL). General instructions  Schedule regular health, dental, and eye exams.  Stay current with your vaccines.  Tell your health care provider if: ? You often feel depressed. ? You have ever been abused or do not feel safe at home. Summary  Adopting a healthy lifestyle and getting preventive care are important in promoting health and wellness.  Follow your health care provider's instructions about healthy diet, exercising, and getting tested or screened for diseases.  Follow your health care provider's instructions on monitoring your cholesterol and blood pressure. This information is not intended to replace advice given to you by your health care provider. Make sure  you discuss any questions you have with your health care provider. Document Revised: 01/12/2018 Document Reviewed: 01/12/2018 Elsevier Patient Education  2021 [employer].

## 2020-04-17 NOTE — Progress Notes (Signed)
Internal Medicine Clinic Resident  I have personally reviewed this encounter including the documentation in this note and/or discussed this patient with the care management provider. I will address any urgent items identified by the care management provider and will communicate my actions to the patient's PCP. I have reviewed the patient's CCM visit with my supervising attending, Dr Heber [place].  Will refill patient's levothyroxine. She was signed for a handicap tag for a 5 year certification on 8/50/27 by her PCP.   Maudie Mercury, MD 04/17/2020

## 2020-04-17 NOTE — Telephone Encounter (Signed)
During AWV today, patient expressed she is having trouble maneuvering with her manual w/c.  Asking if she can get a power w/c. Forwarding to [employer]. SChaplin, RN,BSN

## 2020-04-18 NOTE — Progress Notes (Signed)
Internal Medicine Clinic Attending  I reviewed the AWV findings.  I agree with the assessment, diagnosis, and plan of care documented in the AWV note.     

## 2020-04-18 NOTE — Progress Notes (Signed)
I discussed the AWV findings with the RN who conducted the visit. I was present in the office suite and immediately available to provide assistance and direction throughout the time the service was provided.  Mitzi Hansen, MD Internal Medicine Resident PGY-2 [facility] Pager: [phone] 04/18/2020 11:30 AM

## 2020-04-24 DIAGNOSIS — M353 Polymyalgia rheumatica: Secondary | ICD-10-CM | POA: Diagnosis not present

## 2020-04-25 ENCOUNTER — Emergency Department (HOSPITAL_COMMUNITY): Payer: Medicare Other

## 2020-04-25 ENCOUNTER — Other Ambulatory Visit: Payer: Self-pay

## 2020-04-25 ENCOUNTER — Ambulatory Visit
Admission: EM | Admit: 2020-04-25 | Discharge: 2020-04-25 | Disposition: A | Discharge status: Home / Self Care | Payer: Medicare Other

## 2020-04-25 ENCOUNTER — Observation Stay (HOSPITAL_COMMUNITY)
Admission: EM | Admit: 2020-04-25 | Discharge: 2020-04-26 | Disposition: A | Discharge status: Home / Self Care | Payer: Medicare Other | Attending: Internal Medicine | Admitting: Internal Medicine

## 2020-04-25 ENCOUNTER — Encounter (HOSPITAL_COMMUNITY): Payer: Self-pay | Admitting: Internal Medicine

## 2020-04-25 DIAGNOSIS — E119 Type 2 diabetes mellitus without complications: Secondary | ICD-10-CM | POA: Insufficient documentation

## 2020-04-25 DIAGNOSIS — R059 Cough, unspecified: Secondary | ICD-10-CM | POA: Diagnosis not present

## 2020-04-25 DIAGNOSIS — J101 Influenza due to other identified influenza virus with other respiratory manifestations: Secondary | ICD-10-CM | POA: Diagnosis present

## 2020-04-25 DIAGNOSIS — Z20822 Contact with and (suspected) exposure to covid-19: Secondary | ICD-10-CM | POA: Insufficient documentation

## 2020-04-25 DIAGNOSIS — Z87891 Personal history of nicotine dependence: Secondary | ICD-10-CM | POA: Insufficient documentation

## 2020-04-25 DIAGNOSIS — I517 Cardiomegaly: Secondary | ICD-10-CM | POA: Diagnosis not present

## 2020-04-25 DIAGNOSIS — J09X2 Influenza due to identified novel influenza A virus with other respiratory manifestations: Secondary | ICD-10-CM | POA: Diagnosis not present

## 2020-04-25 DIAGNOSIS — E039 Hypothyroidism, unspecified: Secondary | ICD-10-CM | POA: Insufficient documentation

## 2020-04-25 DIAGNOSIS — I509 Heart failure, unspecified: Secondary | ICD-10-CM | POA: Insufficient documentation

## 2020-04-25 DIAGNOSIS — J811 Chronic pulmonary edema: Secondary | ICD-10-CM | POA: Diagnosis not present

## 2020-04-25 DIAGNOSIS — J449 Chronic obstructive pulmonary disease, unspecified: Secondary | ICD-10-CM | POA: Insufficient documentation

## 2020-04-25 DIAGNOSIS — I130 Hypertensive heart and chronic kidney disease with heart failure and stage 1 through stage 4 chronic kidney disease, or unspecified chronic kidney disease: Secondary | ICD-10-CM | POA: Diagnosis not present

## 2020-04-25 DIAGNOSIS — Z7984 Long term (current) use of oral hypoglycemic drugs: Secondary | ICD-10-CM | POA: Insufficient documentation

## 2020-04-25 DIAGNOSIS — Z79899 Other long term (current) drug therapy: Secondary | ICD-10-CM | POA: Diagnosis not present

## 2020-04-25 DIAGNOSIS — N1832 Chronic kidney disease, stage 3b: Secondary | ICD-10-CM | POA: Diagnosis not present

## 2020-04-25 DIAGNOSIS — R0602 Shortness of breath: Secondary | ICD-10-CM | POA: Diagnosis not present

## 2020-04-25 DIAGNOSIS — R509 Fever, unspecified: Secondary | ICD-10-CM | POA: Diagnosis not present

## 2020-04-25 DIAGNOSIS — R0902 Hypoxemia: Secondary | ICD-10-CM | POA: Diagnosis not present

## 2020-04-25 LAB — URINALYSIS, ROUTINE W REFLEX MICROSCOPIC
Bilirubin Urine: NEGATIVE
Glucose, UA: NEGATIVE mg/dL
Hgb urine dipstick: NEGATIVE
Ketones, ur: NEGATIVE mg/dL
Nitrite: NEGATIVE
Protein, ur: NEGATIVE mg/dL
Specific Gravity, Urine: 1.012 (ref 1.005–1.030)
pH: 5 (ref 5.0–8.0)

## 2020-04-25 LAB — CBC
HCT: 33.5 % — ABNORMAL LOW (ref 36.0–46.0)
Hemoglobin: 10.7 g/dL — ABNORMAL LOW (ref 12.0–15.0)
MCH: 28.5 pg (ref 26.0–34.0)
MCHC: 31.9 g/dL (ref 30.0–36.0)
MCV: 89.3 fL (ref 80.0–100.0)
Platelets: 202 10*3/uL (ref 150–400)
RBC: 3.75 MIL/uL — ABNORMAL LOW (ref 3.87–5.11)
RDW: 17.2 % — ABNORMAL HIGH (ref 11.5–15.5)
WBC: 8.1 10*3/uL (ref 4.0–10.5)
nRBC: 0 % (ref 0.0–0.2)

## 2020-04-25 LAB — BASIC METABOLIC PANEL
Anion gap: 8 (ref 5–15)
BUN: 27 mg/dL — ABNORMAL HIGH (ref 8–23)
CO2: 18 mmol/L — ABNORMAL LOW (ref 22–32)
Calcium: 10.2 mg/dL (ref 8.9–10.3)
Chloride: 109 mmol/L (ref 98–111)
Creatinine, Ser: 1.4 mg/dL — ABNORMAL HIGH (ref 0.44–1.00)
GFR, Estimated: 38 mL/min — ABNORMAL LOW (ref >60)
Glucose, Bld: 154 mg/dL — ABNORMAL HIGH (ref 70–99)
Potassium: 4.7 mmol/L (ref 3.5–5.1)
Sodium: 135 mmol/L (ref 135–145)

## 2020-04-25 LAB — HEPATIC FUNCTION PANEL
ALT: 10 U/L (ref 0–44)
AST: 19 U/L (ref 15–41)
Albumin: 4 g/dL (ref 3.5–5.0)
Alkaline Phosphatase: 111 U/L (ref 38–126)
Bilirubin, Direct: <0.1 mg/dL (ref 0.0–0.2)
Total Bilirubin: 0.7 mg/dL (ref 0.3–1.2)
Total Protein: 7.8 g/dL (ref 6.5–8.1)

## 2020-04-25 LAB — LACTIC ACID, PLASMA: Lactic Acid, Venous: 1.4 mmol/L (ref 0.5–1.9)

## 2020-04-25 LAB — RESP PANEL BY RT-PCR (FLU A&B, COVID) ARPGX2
Influenza A by PCR: POSITIVE — AB
Influenza B by PCR: NEGATIVE
SARS Coronavirus 2 by RT PCR: NEGATIVE

## 2020-04-25 LAB — TROPONIN I (HIGH SENSITIVITY)
Troponin I (High Sensitivity): 6 ng/L (ref <18)
Troponin I (High Sensitivity): 6 ng/L (ref <18)

## 2020-04-25 MED ORDER — RAMELTEON 8 MG PO TABS
8.0000 mg | ORAL_TABLET | Freq: Every day | ORAL | Status: DC
  Filled 2020-04-25: qty 1

## 2020-04-25 MED ORDER — SODIUM CHLORIDE 0.9 % IV SOLN
2.0000 g | Freq: Once | INTRAVENOUS | Status: AC
  Administered 2020-04-25: 2 g via INTRAVENOUS
  Filled 2020-04-25: qty 2

## 2020-04-25 MED ORDER — LATANOPROST 0.005 % OP SOLN
1.0000 [drp] | Freq: Every day | OPHTHALMIC | Status: DC
  Filled 2020-04-25: qty 2.5

## 2020-04-25 MED ORDER — ATORVASTATIN CALCIUM 40 MG PO TABS
40.0000 mg | ORAL_TABLET | Freq: Every day | ORAL | Status: DC
  Administered 2020-04-26: 40 mg via ORAL
  Filled 2020-04-25: qty 1

## 2020-04-25 MED ORDER — LACTATED RINGERS IV SOLN: INTRAVENOUS | Status: DC

## 2020-04-25 MED ORDER — LACTATED RINGERS IV BOLUS (SEPSIS)
2000.0000 mL | Freq: Once | INTRAVENOUS | Status: AC
  Administered 2020-04-25: 2000 mL via INTRAVENOUS

## 2020-04-25 MED ORDER — OSELTAMIVIR PHOSPHATE 75 MG PO CAPS: 75.0000 mg | ORAL_CAPSULE | Freq: Two times a day (BID) | ORAL | Status: DC

## 2020-04-25 MED ORDER — UMECLIDINIUM BROMIDE 62.5 MCG/INH IN AEPB
1.0000 | INHALATION_SPRAY | Freq: Every day | RESPIRATORY_TRACT | Status: DC
  Administered 2020-04-26: 1 via RESPIRATORY_TRACT
  Filled 2020-04-25: qty 7

## 2020-04-25 MED ORDER — ENOXAPARIN SODIUM 40 MG/0.4ML ~~LOC~~ SOLN
40.0000 mg | SUBCUTANEOUS | Status: DC
  Administered 2020-04-25: 40 mg via SUBCUTANEOUS
  Filled 2020-04-25: qty 0.4

## 2020-04-25 MED ORDER — INSULIN ASPART 100 UNIT/ML ~~LOC~~ SOLN
0.0000 [IU] | Freq: Three times a day (TID) | SUBCUTANEOUS | Status: DC
  Administered 2020-04-26: 1 [IU] via SUBCUTANEOUS

## 2020-04-25 MED ORDER — VANCOMYCIN HCL 1000 MG/200ML IV SOLN: 1000.0000 mg | INTRAVENOUS | Status: DC

## 2020-04-25 MED ORDER — CARVEDILOL 25 MG PO TABS
25.0000 mg | ORAL_TABLET | Freq: Two times a day (BID) | ORAL | Status: DC
  Administered 2020-04-26: 25 mg via ORAL
  Filled 2020-04-25 (×2): qty 1
  Filled 2020-04-25: qty 8

## 2020-04-25 MED ORDER — SODIUM CHLORIDE 0.9 % IV SOLN: 2.0000 g | Freq: Two times a day (BID) | INTRAVENOUS | Status: DC

## 2020-04-25 MED ORDER — AMLODIPINE BESYLATE 5 MG PO TABS
5.0000 mg | ORAL_TABLET | Freq: Every day | ORAL | Status: DC
  Administered 2020-04-26: 5 mg via ORAL
  Filled 2020-04-25: qty 1

## 2020-04-25 MED ORDER — OSELTAMIVIR PHOSPHATE 75 MG PO CAPS
75.0000 mg | ORAL_CAPSULE | Freq: Once | ORAL | Status: AC
  Administered 2020-04-25: 75 mg via ORAL
  Filled 2020-04-25: qty 1

## 2020-04-25 MED ORDER — ACETAMINOPHEN 325 MG PO TABS
650.0000 mg | ORAL_TABLET | Freq: Four times a day (QID) | ORAL | Status: DC | PRN
  Administered 2020-04-25: 650 mg via ORAL
  Filled 2020-04-25: qty 2

## 2020-04-25 MED ORDER — ALBUTEROL SULFATE HFA 108 (90 BASE) MCG/ACT IN AERS
1.0000 | INHALATION_SPRAY | Freq: Four times a day (QID) | RESPIRATORY_TRACT | Status: DC | PRN
  Filled 2020-04-25: qty 6.7

## 2020-04-25 MED ORDER — CHLORTHALIDONE 25 MG PO TABS
25.0000 mg | ORAL_TABLET | Freq: Every day | ORAL | Status: DC
  Administered 2020-04-26: 25 mg via ORAL
  Filled 2020-04-25: qty 1

## 2020-04-25 MED ORDER — VANCOMYCIN HCL 1500 MG/300ML IV SOLN
1500.0000 mg | Freq: Once | INTRAVENOUS | Status: DC
  Administered 2020-04-25: 1500 mg via INTRAVENOUS
  Filled 2020-04-25: qty 300

## 2020-04-25 MED ORDER — LISINOPRIL 40 MG PO TABS
40.0000 mg | ORAL_TABLET | Freq: Every day | ORAL | Status: DC
  Administered 2020-04-26: 40 mg via ORAL
  Filled 2020-04-25: qty 1

## 2020-04-25 MED ORDER — LEVOTHYROXINE SODIUM 75 MCG PO TABS
75.0000 ug | ORAL_TABLET | Freq: Every day | ORAL | Status: DC
  Administered 2020-04-26: 75 ug via ORAL
  Filled 2020-04-25: qty 1

## 2020-04-25 MED ORDER — PAROXETINE HCL 30 MG PO TABS
30.0000 mg | ORAL_TABLET | Freq: Every day | ORAL | Status: DC
  Administered 2020-04-26: 30 mg via ORAL
  Filled 2020-04-25: qty 1

## 2020-04-25 MED ORDER — OSELTAMIVIR PHOSPHATE 30 MG PO CAPS
30.0000 mg | ORAL_CAPSULE | Freq: Two times a day (BID) | ORAL | Status: DC
  Administered 2020-04-26: 30 mg via ORAL
  Filled 2020-04-25 (×2): qty 1

## 2020-04-25 MED ORDER — ACETAMINOPHEN 650 MG RE SUPP: 650.0000 mg | Freq: Four times a day (QID) | RECTAL | Status: DC | PRN

## 2020-04-25 MED ORDER — PANTOPRAZOLE SODIUM 20 MG PO TBEC
20.0000 mg | DELAYED_RELEASE_TABLET | Freq: Every day | ORAL | Status: DC
  Administered 2020-04-26: 20 mg via ORAL
  Filled 2020-04-25: qty 1

## 2020-04-25 NOTE — Progress Notes (Signed)
[facility] is following this Code Sepsis. 

## 2020-04-25 NOTE — ED Triage Notes (Signed)
[facility] sent pt down to the ED for fever and sats of 91 % , 94 % in the ED , pt has a cough and some sob since monday

## 2020-04-25 NOTE — ED Notes (Signed)
ED resident attempted [place] IV, pt's daughter requesting different team member attempt.
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
## 2020-04-25 NOTE — Progress Notes (Signed)
Pharmacy Antibiotic Note  Caitlyn Aguirre is a 85 y.o. female admitted on 04/25/2020 with sepsis.  Pharmacy has been consulted for vancomycin and cefepime dosing. Scr 1.4 (BL 1.2-1.4).  Plan: Vancomycin 1500 mg IV x1, followed by 1000 mg IV every 24 hours (eAUC 536.7, goal AUC 400-550, Scr 1.4) Cefepime 2 g IV every 12 hours Monitor renal function, C&S, vanc levels as indicated  Height: 5\' 8"  (172.7 cm) Weight: 85.3 kg (188 lb) IBW/kg (Calculated) : 63.9  Temp (24hrs), Avg:100.4 F (38 C), Min:100.1 F (37.8 C), Max:100.8 F (38.2 C)  Recent Labs  Lab 04/25/20 1342  WBC 8.1  CREATININE 1.40*    Estimated Creatinine Clearance: 35.5 mL/min (A) (by C-G formula based on SCr of 1.4 mg/dL (H)).    No Known Allergies  Antimicrobials this admission: Vancomycin 3/24> Cefepime 3/24>   Dose adjustments this admission: N/A  Microbiology results: 3/24 Bcx: 3/24 Ucx:  Thank you for allowing pharmacy to be a part of this patient's care.  Romilda Garret, PharmD PGY1 Acute Care Pharmacy Resident 04/25/2020 5:51 PM  Please check [email or web address] for unit specific pharmacy phone numbers.

## 2020-04-25 NOTE — ED Notes (Signed)
Patient is being discharged from the Urgent Care and sent to the Emergency Department via private vehicle . Per [employer] PA, patient is in need of higher level of care due to hypoxia, weakness and fever . Patient is aware and verbalizes understanding of plan of care.  Vitals:   04/25/20 1239  BP: 110/69  Pulse: 85  Resp: (!) 24  Temp: 100.2 F (37.9 C)  SpO2: 91%
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
## 2020-04-25 NOTE — ED Notes (Signed)
Attempted IV x2 without success  

## 2020-04-26 ENCOUNTER — Encounter (HOSPITAL_COMMUNITY): Payer: Self-pay | Admitting: Internal Medicine

## 2020-04-26 ENCOUNTER — Ambulatory Visit: Payer: Medicare Other | Admitting: *Deleted

## 2020-04-26 DIAGNOSIS — J101 Influenza due to other identified influenza virus with other respiratory manifestations: Secondary | ICD-10-CM

## 2020-04-26 DIAGNOSIS — I10 Essential (primary) hypertension: Secondary | ICD-10-CM

## 2020-04-26 DIAGNOSIS — N1832 Chronic kidney disease, stage 3b: Secondary | ICD-10-CM

## 2020-04-26 DIAGNOSIS — J449 Chronic obstructive pulmonary disease, unspecified: Secondary | ICD-10-CM

## 2020-04-26 DIAGNOSIS — E1169 Type 2 diabetes mellitus with other specified complication: Secondary | ICD-10-CM

## 2020-04-26 LAB — CBC
HCT: 27.7 % — ABNORMAL LOW (ref 36.0–46.0)
Hemoglobin: 9.1 g/dL — ABNORMAL LOW (ref 12.0–15.0)
MCH: 28 pg (ref 26.0–34.0)
MCHC: 32.9 g/dL (ref 30.0–36.0)
MCV: 85.2 fL (ref 80.0–100.0)
Platelets: 154 10*3/uL (ref 150–400)
RBC: 3.25 MIL/uL — ABNORMAL LOW (ref 3.87–5.11)
RDW: 16.5 % — ABNORMAL HIGH (ref 11.5–15.5)
WBC: 6.5 10*3/uL (ref 4.0–10.5)
nRBC: 0 % (ref 0.0–0.2)

## 2020-04-26 LAB — BASIC METABOLIC PANEL
Anion gap: 8 (ref 5–15)
BUN: 20 mg/dL (ref 8–23)
CO2: 19 mmol/L — ABNORMAL LOW (ref 22–32)
Calcium: 10 mg/dL (ref 8.9–10.3)
Chloride: 110 mmol/L (ref 98–111)
Creatinine, Ser: 1.27 mg/dL — ABNORMAL HIGH (ref 0.44–1.00)
GFR, Estimated: 42 mL/min — ABNORMAL LOW (ref >60)
Glucose, Bld: 111 mg/dL — ABNORMAL HIGH (ref 70–99)
Potassium: 4.3 mmol/L (ref 3.5–5.1)
Sodium: 137 mmol/L (ref 135–145)

## 2020-04-26 LAB — URINE CULTURE

## 2020-04-26 LAB — LACTIC ACID, PLASMA: Lactic Acid, Venous: 1 mmol/L (ref 0.5–1.9)

## 2020-04-26 LAB — GLUCOSE, CAPILLARY
Glucose-Capillary: 108 mg/dL — ABNORMAL HIGH (ref 70–99)
Glucose-Capillary: 140 mg/dL — ABNORMAL HIGH (ref 70–99)

## 2020-04-26 LAB — BRAIN NATRIURETIC PEPTIDE: B Natriuretic Peptide: 59.9 pg/mL (ref 0.0–100.0)

## 2020-04-26 MED ORDER — OSELTAMIVIR PHOSPHATE 30 MG PO CAPS: 30.0000 mg | ORAL_CAPSULE | Freq: Two times a day (BID) | ORAL | 0 refills | Status: DC

## 2020-04-26 NOTE — Progress Notes (Signed)
Per RN, pt discharging.  No Moon given. Lurline Idol, MSW, LCSW 3/25/20223:57 PM

## 2020-04-26 NOTE — Plan of Care (Signed)
Admitted pt with Flu. Contact and droplet precautions maintained.. Safety precautions maintained.

## 2020-04-26 NOTE — Chronic Care Management (AMB) (Signed)
   04/26/2020  Caitlyn Aguirre December 29, 1935 [id number]  Received notification via ADT folder of patient's hospitalization on 04/25/20 for Influenza A.  Patient has been evaluated by OT, PT evaluation pending. Noted that patient called clinic on 3/16 requesting power w/c as she states the [facility] w/c is too hard for her to maneuver.  Plan: Message sent to  [facility] hospital liaison [name] of patient's DME request and asked Tammi Klippel to notify the acute transition of care team to determine if patient meets criteria for power w/c when PT completes evaluation.  Kelli Churn RN, CCM, [facility] RN Care Manager [phone]

## 2020-04-26 NOTE — Discharge Summary (Signed)
Name: Caitlyn Aguirre MRN: [id number] DOB: [birth year] 85 y.o. PCP: Axel Filler, MD  Date of Admission: 04/25/2020  1:28 PM Date of Discharge: 04/26/2020 Attending Physician: Angelica Pou, MD  Discharge Diagnosis: 1. Active Problems:   Influenza A  Discharge Medications: Allergies as of 04/26/2020   No Known Allergies     Medication List    TAKE these medications   Accu-Chek Guide Me w/Device Kit Check 1 times a day as instructed   Accu-Chek Guide test strip Generic drug: glucose blood Check blood sugar 1 time per day   Accu-Chek Softclix Lancets lancets Check 1 time a day as instructed   acetaminophen 325 MG tablet Commonly known as: TYLENOL Take 2 tablets (650 mg total) by mouth every 6 (six) hours as needed for mild pain (or Fever >/= 101).   albuterol 108 (90 Base) MCG/ACT inhaler Commonly known as: VENTOLIN HFA Inhale 1-2 puffs into the lungs every 6 (six) hours as needed for wheezing or shortness of breath.   amLODipine 5 MG tablet Commonly known as: NORVASC Take 1 tablet (5 mg total) by mouth daily.   atorvastatin 40 MG tablet Commonly known as: LIPITOR Take 1 tablet (40 mg total) by mouth daily.   carvedilol 25 MG tablet Commonly known as: COREG TAKE 1 TABLET BY MOUTH  TWICE DAILY WITH A MEAL   chlorthalidone 25 MG tablet Commonly known as: HYGROTON TAKE 1 TABLET BY MOUTH  DAILY   ibuprofen 200 MG tablet Commonly known as: ADVIL Take 200 mg by mouth daily at 6 (six) AM.   latanoprost 0.005 % ophthalmic solution Commonly known as: XALATAN Place 1 drop into both eyes at bedtime.   levothyroxine 75 MCG tablet Commonly known as: SYNTHROID TAKE 1 TABLET BY MOUTH EVERY DAY IN THE MORNING ON Aguirre EMPTY STOMACH. What changed:   how much to take  how to take this  when to take this  additional instructions   lisinopril 40 MG tablet Commonly known as: ZESTRIL   loratadine 10 MG tablet Commonly known as: CLARITIN Take 1 tablet  (10 mg total) by mouth daily.   metFORMIN 1000 MG tablet Commonly known as: GLUCOPHAGE TAKE 1 TABLET BY MOUTH  TWICE DAILY WITH A MEAL What changed: when to take this   omeprazole 20 MG capsule Commonly known as: PRILOSEC TAKE 1 CAPSULE BY MOUTH  DAILY   oseltamivir 30 MG capsule Commonly known as: TAMIFLU Take 1 capsule (30 mg total) by mouth 2 (two) times daily.   OXYGEN Inhale 2 L into the lungs daily as needed (for breathing).   PARoxetine 30 MG tablet Commonly known as: PAXIL Take 1 tablet (30 mg total) by mouth daily.   ramelteon 8 MG tablet Commonly known as: ROZEREM Take 1 tablet (8 mg total) by mouth at bedtime.   Spiriva HandiHaler 18 MCG inhalation capsule Generic drug: tiotropium INHALE THE CONTENTS OF 1  CAPSULE BY MOUTH VIA  HANDIHALER DAILY What changed: See the new instructions.      Disposition and follow-up:   Caitlyn Aguirre was discharged from [facility] in Good condition.  At the hospital follow up visit please address:  1.  Influenza A: Patient presented with dyspnea, cough and chills found to have influenza A infection. Patient received Tamiflu and supportive care with significant improvement overnight. Patient discharged on home oxygen of 2L with plan to complete course of Tamiflu. Strongly encouraged patient to consider getting COVID-19 vaccination as well as influenza vaccination  next season. At hospital follow-up, assess patient's recovery from infection including her oxygen requirement.  2.  Labs / imaging needed at time of follow-up: None  3.  Pending labs/ test needing follow-up: None  Follow-up Appointments:  Follow-up Information    Axel Filler, MD Follow up.   Specialty: Internal Medicine Contact information: [address] [place] [address] [place] Hospital Course by problem list: 1. Influenza A: Patient presented for evaluation of shortness of breath, productive  cough and chills. Patient febrile in emergency department to 100.8. Patient's laboratory workup revealing for positive Influenza A PCR, otherwise unremarkable for Aguirre acute abnormality. Patient's CXR revealed stable cardiomegaly with pulmonary vascular congestion although BNP of 59.9. Patient restarted on home medication regimens, home oxygen requirement of 2L via nasal cannula and administered Tamiflu. On repeat evaluation the following day, patient reported that her symptoms had completely resolved and that she felt great. Patient evaluated by PT and OT with no further recommendations or equipment recommendations. Discussed with patient the importance of obtaining the COVID-19 vaccination as well as influenza vaccine next season.  Discharge Exam:   BP 112/67 (BP Location: Left Arm)   Pulse 80   Temp 98.6 F (37 C)   Resp 18   Ht _0  (1.727 m)   Wt 85.3 kg   SpO2 90%   BMI 28.59 kg/m  Discharge exam:  Constitutional: Comfortable-appearing woman sitting upright on edge of bed in no acute distress, wearing oxygen via nasal cannula at home 2L Respiratory: Crackles in bilateral lower lobes worse on left than the right. No increased work of breathing, tachypnea, or cough during examination. Psych: Appropriate mood and affect Musculoskeletal: Spontaneously moving all extremities, able to sit to edge of bed without difficulty  Pertinent Labs, Studies, and Procedures:   BNP - 59.9 Lactic acid - 1.0 Respiratory panel - COVID negative, Influenza A positive, Influenza B negative  CBC Latest Ref Rng & Units 04/26/2020 04/25/2020 10/02/2019  WBC 4.0 - 10.5 K/uL 6.5 8.1 12.9(H)  Hemoglobin 12.0 - 15.0 g/dL 9.1(L) 10.7(L) 10.7(L)  Hematocrit 36.0 - 46.0 % 27.7(L) 33.5(L) 33.4(L)  Platelets 150 - 400 K/uL 154 202 248   CMP Latest Ref Rng & Units 04/26/2020 04/25/2020 10/02/2019  Glucose 70 - 99 mg/dL 111(H) 154(H) 189(H)  BUN 8 - 23 mg/dL 20 27(H) 17  Creatinine 0.44 - 1.00 mg/dL 1.27(H) 1.40(H)  1.23(H)  Sodium 135 - 145 mmol/L 137 135 138  Potassium 3.5 - 5.1 mmol/L 4.3 4.7 5.1  Chloride 98 - 111 mmol/L 110 109 105  CO2 22 - 32 mmol/L 19(L) 18(L) 18(L)  Calcium 8.9 - 10.3 mg/dL 10.0 10.2 10.3  Total Protein 6.5 - 8.1 g/dL - 7.8 -  Total Bilirubin 0.3 - 1.2 mg/dL - 0.7 -  Alkaline Phos 38 - 126 U/L - 111 -  AST 15 - 41 U/L - 19 -  ALT 0 - 44 U/L - 10 -  DG Chest 2 View  Result Date: 04/25/2020 CLINICAL DATA:  Fever. Hypoxia. Cough and shortness of breath for 2 days. EXAM: CHEST - 2 VIEW COMPARISON:  07/15/2019 FINDINGS: Stable borderline cardiomegaly and pulmonary vascular congestion. Ectasia of thoracic aorta is also stable. No evidence of acute infiltrate or edema. No evidence of pleural effusion. Old left rib fracture deformities again noted IMPRESSION: Stable mild cardiomegaly and pulmonary vascular congestion. No active disease. Electronically Signed   By: Jenny Reichmann  Tereso Newcomer M.D.   On: 04/25/2020 14:58   Discharge Instructions: Discharge Instructions    Call MD for:  difficulty breathing, headache or visual disturbances   Complete by: As directed    Call MD for:  extreme fatigue   Complete by: As directed    Call MD for:  persistant dizziness or light-headedness   Complete by: As directed    Call MD for:  persistant nausea and vomiting   Complete by: As directed    Call MD for:  temperature >100.4   Complete by: As directed    Diet - low sodium heart healthy   Complete by: As directed    Discharge instructions   Complete by: As directed    Caitlyn Aguirre,  It was a pleasure meeting you during your recent hospitalization.  You were hospitalized and found to have the Flu. We treated your infection with a medication called Tamiflu and provided you with supportive care. You improved significantly. You are safe to discharge home.  Sincerely, Dr. Paulla Dolly, MD   Increase activity slowly   Complete by: As directed      Signed: Cato Mulligan, MD 04/26/2020, 4:49 PM    Pager: [phone]

## 2020-04-26 NOTE — Evaluation (Signed)
Physical Therapy Evaluation Patient Details Name: Caitlyn Aguirre MRN: [id number] DOB: Mar 30, 1935 Today's Date: 04/26/2020   History of Present Illness  Caitlyn Aguirre is an 85 year old female with PMHx of HFpEF (EF 55-60%), COPD on 2L O2 at baseline, diabetes mellitus, CHF, hypertension, CKD stage III, hypothyroidism, and GERD presenting with two days of chills, shortness of breath and productive cough admitted for influenza A.  Clinical Impression  Pt presents to PT with slight decr in mobility due to illness and inactivity. Expect pt will make good progress back to baseline with mobility. Will follow acutely but doubt pt will need PT after DC.      Follow Up Recommendations No PT follow up;Supervision for mobility/OOB    Equipment Recommendations  None recommended by PT    Recommendations for Other Services       Precautions / Restrictions Precautions Precautions: Fall;Other (comment) Precaution Comments: droplet      Mobility  Bed Mobility Overal bed mobility: Needs Assistance Bed Mobility: Supine to Sit;Sit to Supine     Supine to sit: Min assist Sit to supine: Min assist   General bed mobility comments: Assist to elevate trunk into sitting. Assist to bring legs back up into bed on return to supine    Transfers Overall transfer level: Needs assistance Equipment used: Rolling walker (2 wheeled) Transfers: Sit to/from Stand Sit to Stand: Min assist         General transfer comment: Assist to bring hips up  Ambulation/Gait Ambulation/Gait assistance: Supervision Gait Distance (Feet): 50 Feet Assistive device: Rolling walker (2 wheeled) Gait Pattern/deviations: Step-through pattern;Decreased step length - right;Decreased step length - left;Shuffle Gait velocity: decr Gait velocity interpretation: <1.31 ft/sec, indicative of household ambulator General Gait Details: assist for safety  Stairs            Wheelchair Mobility    Modified Rankin (Stroke  Patients Only)       Balance Overall balance assessment: Mild deficits observed, not formally tested                                           Pertinent Vitals/Pain Pain Assessment: No/denies pain    Home Living Family/patient expects to be discharged to:: Private residence Living Arrangements: Children Available Help at Discharge: Family;Available 24 hours/day Type of Home: House Home Access: Stairs to enter Entrance Stairs-Rails: Chemical engineer of Steps: 4 at back door, 6 at front Home Layout: One level Home Equipment: Environmental consultant - 2 wheels;Cane - single point;Shower seat      Prior Function Level of Independence: Needs assistance   Gait / Transfers Assistance Needed: uses RW for mobility, denies any falls  ADL's / Homemaking Assistance Needed: Able to complete ADLs. Daughter does IADLs. Likes to go fishing  Comments: wears 2 L O2 at home     Hand Dominance   Dominant Hand: Right    Extremity/Trunk Assessment   Upper Extremity Assessment Upper Extremity Assessment: Defer to OT evaluation    Lower Extremity Assessment Lower Extremity Assessment: Generalized weakness       Communication   Communication: HOH  Cognition Arousal/Alertness: Awake/alert Behavior During Therapy: WFL for tasks assessed/performed Overall Cognitive Status: Within Functional Limits for tasks assessed  General Comments General comments (skin integrity, edema, etc.): Amb on 2L of O2    Exercises     Assessment/Plan    PT Assessment Patient needs continued PT services  PT Problem List Decreased strength;Decreased activity tolerance;Decreased balance;Decreased mobility       PT Treatment Interventions DME instruction;Gait training;Functional mobility training;Therapeutic activities;Therapeutic exercise;Balance training;Patient/family education    PT Goals (Current goals can be found in the  Care Plan section)  Acute Rehab PT Goals Patient Stated Goal: go fishing again PT Goal Formulation: With patient/family Time For Goal Achievement: 05/10/20 Potential to Achieve Goals: Good    Frequency Min 3X/week   Barriers to discharge        Co-evaluation               AM-PAC PT "6 Clicks" Mobility  Outcome Measure Help needed turning from your back to your side while in a flat bed without using bedrails?: None Help needed moving from lying on your back to sitting on the side of a flat bed without using bedrails?: A Little Help needed moving to and from a bed to a chair (including a wheelchair)?: A Little Help needed standing up from a chair using your arms (e.g., wheelchair or bedside chair)?: A Little Help needed to walk in hospital room?: A Little Help needed climbing 3-5 steps with a railing? : A Little 6 Click Score: 19    End of Session Equipment Utilized During Treatment: Gait belt;Oxygen Activity Tolerance: Patient limited by fatigue Patient left: in bed;with call bell/phone within reach;with bed alarm set;with family/visitor present   PT Visit Diagnosis: Other abnormalities of gait and mobility (R26.89);Muscle weakness (generalized) (M62.81)    Time: 0102-7253 PT Time Calculation (min) (ACUTE ONLY): 17 min   Charges:   PT Evaluation $PT Eval Moderate Complexity: [place] Pager [phone] Office [place] 04/26/2020, 2:58 PM

## 2020-04-26 NOTE — Evaluation (Signed)
Occupational Therapy Evaluation/Discharge Patient Details Name: Caitlyn Aguirre MRN: [id number] DOB: 10/04/1935 Today's Date: 04/26/2020    History of Present Illness [name] is an 85 year old female with PMHx of HFpEF (EF 55-60%), COPD on 2L O2 at baseline, diabetes mellitus, CHF, hypertension, CKD stage III, hypothyroidism, and GERD presenting with two days of chills, shortness of breath and productive cough admitted for influenza A.   Clinical Impression   PTA, pt lives with daughter and reports Modified Independence with ADLs and mobility using RW. Daughter assists with IADLs and pt denies any falls. Pt reports wearing 2 L O2 prn at home. Pt presents now with minor deficits in activity tolerance and cardiopulmonary tolerance but able to demo ADLs and short mobility using RW at [place]. No LOB or safety concerns with daily tasks assessed this AM. With this activity and removal of O2 for ADLs at sink, SpO2 to 88%, but recovered to 90% within 1 minute on return back to bed. Anticipate pt to return to normal activity quickly at home without a need for therapy follow-up. OT to sign off at acute level.     Follow Up Recommendations  No OT follow up    Equipment Recommendations  None recommended by OT    Recommendations for Other Services       Precautions / Restrictions Precautions Precautions: Fall;Other (comment) Precaution Comments: droplet, contact precautions Restrictions Weight Bearing Restrictions: No      Mobility Bed Mobility Overal bed mobility: Modified Independent             General bed mobility comments: use of bedrail    Transfers Overall transfer level: Modified independent Equipment used: Rolling walker (2 wheeled)             General transfer comment: Able to stand, turn and complete short mobility with RW, no LOB or safety concerns    Balance Overall balance assessment: No apparent balance deficits (not formally assessed)                                          ADL either performed or assessed with clinical judgement   ADL Overall ADL's : Modified independent                                       General ADL Comments: Able to complete mobility in room using RW, ADLs standing at sink and LB dressing to don underwear without assistance     Vision Baseline Vision/History: Wears glasses Wears Glasses: At all times Patient Visual Report: No change from baseline Vision Assessment?: No apparent visual deficits     Perception     Praxis      Pertinent Vitals/Pain Pain Assessment: No/denies pain     Hand Dominance Right   Extremity/Trunk Assessment Upper Extremity Assessment Upper Extremity Assessment: Overall WFL for tasks assessed   Lower Extremity Assessment Lower Extremity Assessment: Defer to PT evaluation   Cervical / Trunk Assessment Cervical / Trunk Assessment: Normal   Communication Communication Communication: No difficulties   Cognition Arousal/Alertness: Awake/alert Behavior During Therapy: WFL for tasks assessed/performed Overall Cognitive Status: Within Functional Limits for tasks assessed  General Comments  After activity and removal of O2 to wash face at sink, SpO2 88% after return back to bed. Increased to 90% within 1 min seated rest break    Exercises     Shoulder Instructions      Home Living Family/patient expects to be discharged to:: Private residence Living Arrangements: Children Available Help at Discharge: Family;Available 24 hours/day Type of Home: House Home Access: Stairs to enter [employer] of Steps: 4 at back door, 6 at front Entrance Stairs-Rails: Left;Right Home Layout: One level     Bathroom Shower/Tub: Teacher, early years/pre: Standard     Home Equipment: Environmental consultant - 2 wheels;Cane - single point;Shower seat          Prior  Functioning/Environment Level of Independence: Needs assistance  Gait / Transfers Assistance Needed: uses RW for mobility, denies any falls ADL's / Homemaking Assistance Needed: Able to complete ADLs. Daughter does IADLs. Likes to go fishing   Comments: wears 2 L O2 at home        OT Problem List: Cardiopulmonary status limiting activity;Decreased activity tolerance      OT Treatment/Interventions:      OT Goals(Current goals can be found in the care plan section) Acute Rehab OT Goals Patient Stated Goal: go fishing again OT Goal Formulation: All assessment and education complete, DC therapy  OT Frequency:     Barriers to D/C:            Co-evaluation              AM-PAC OT "6 Clicks" Daily Activity     Outcome Measure Help from another person eating meals?: None Help from another person taking care of personal grooming?: None Help from another person toileting, which includes using toliet, bedpan, or urinal?: None Help from another person bathing (including washing, rinsing, drying)?: None Help from another person to put on and taking off regular upper body clothing?: None Help from another person to put on and taking off regular lower body clothing?: None 6 Click Score: 24   End of Session Equipment Utilized During Treatment: Rolling walker;Oxygen Nurse Communication: Mobility status  Activity Tolerance: Patient tolerated treatment well Patient left: in bed;with call bell/phone within reach  OT Visit Diagnosis: Other abnormalities of gait and mobility (R26.89)                Time: 4627-0350 OT Time Calculation (min): 20 min Charges:  OT General Charges $OT Visit: 1 Visit OT Evaluation $OT Eval Low Complexity: 1 Low  Malachy Chamber, OTR/L Acute Rehab Services Office: [phone]  Layla Maw 04/26/2020, 7:55 AM

## 2020-04-29 ENCOUNTER — Telehealth: Payer: Self-pay | Admitting: *Deleted

## 2020-04-29 ENCOUNTER — Telehealth: Payer: Medicare Other

## 2020-04-29 LAB — GLUCOSE, CAPILLARY
Glucose-Capillary: 126 mg/dL — ABNORMAL HIGH (ref 70–99)
Glucose-Capillary: 100 mg/dL — ABNORMAL HIGH (ref 70–99)

## 2020-04-29 NOTE — Progress Notes (Signed)
Internal Medicine Clinic Resident  I have personally reviewed this encounter including the documentation in this note and/or discussed this patient with the care management provider. I will address any urgent items identified by the care management provider and will communicate my actions to the patient's PCP. I have reviewed the patient's CCM visit with my supervising attending, Dr Heber [place].  Mitzi Hansen, MD Internal Medicine Resident PGY-2 [facility] Pager: [phone] 04/29/2020 11:44 AM

## 2020-04-29 NOTE — Telephone Encounter (Signed)
  Chronic Care Management   Outreach Note  04/29/2020 Name: Caitlyn Aguirre MRN: [id number] DOB: 1935/05/07  Referred by: Axel Filler, MD Reason for referral : Care Coordination (NIDDM, HTN, COPD, CKD, HF, polymyalgia rheumatica, + covid 02/06/20, + flu A 04/25/20)   An unsuccessful telephone outreach was attempted today. The purpose of the call was to complete the hospital discharge transition of care assessment. Patient was hospitalized 3/24-3/25/22 for Influenza A. She was discharged home on Tamiflu and home O2 at 2L/Min. The patient was initially referred in April 2021 to the case management team for assistance with care management and care coordination.   Follow Up Plan: A HIPAA compliant phone message was left for the patient providing contact information and requesting a return call.  The care management team will reach out to the patient again over the next 1-7 days.   Kelli Churn RN, CCM, [facility] RN Care Manager [phone]

## 2020-04-30 ENCOUNTER — Telehealth: Payer: Medicare Other

## 2020-04-30 ENCOUNTER — Telehealth: Payer: Self-pay | Admitting: *Deleted

## 2020-04-30 LAB — CULTURE, BLOOD (ROUTINE X 2): Culture: NO GROWTH

## 2020-04-30 NOTE — Telephone Encounter (Signed)
  Care Management   Outreach Note  04/30/2020 Name: Caitlyn Aguirre MRN: [id number] DOB: 04/27/1935  Referred by: Axel Filler, MD Reason for referral : Care Coordination (NIDDM, HTN, COPD, CKD, HF, polymyalgia rheumatica, + covid 02/06/20, + flu A 04/25/20)   A second unsuccessful telephone outreach was attempted today in order to complete a  transition of care phone assessment. Patient was hospitalized 3/24-3/25/22 for Influenza A. She was discharged home on Tamiflu and home O2 at 2L/Min. The patient was initially referred in April 2021 to the case management team for assistance with care management and care coordination.   Follow Up Plan: A HIPAA compliant phone message was left for the patient providing contact information and requesting a return call.  The care management team will reach out to the patient again on 05/03/20.  Kelli Churn RN, CCM, [facility] RN Care Manager [phone]

## 2020-05-01 LAB — CULTURE, BLOOD (ROUTINE X 2)
Culture: NO GROWTH
Special Requests: ADEQUATE

## 2020-05-03 ENCOUNTER — Ambulatory Visit: Payer: Medicare Other | Admitting: *Deleted

## 2020-05-03 DIAGNOSIS — E1169 Type 2 diabetes mellitus with other specified complication: Secondary | ICD-10-CM

## 2020-05-03 DIAGNOSIS — N1832 Chronic kidney disease, stage 3b: Secondary | ICD-10-CM

## 2020-05-03 DIAGNOSIS — I10 Essential (primary) hypertension: Secondary | ICD-10-CM

## 2020-05-03 DIAGNOSIS — J449 Chronic obstructive pulmonary disease, unspecified: Secondary | ICD-10-CM

## 2020-05-03 NOTE — Chronic Care Management (AMB) (Addendum)
   05/03/2020  Caitlyn Aguirre Nov 25, 1935 [id number]  Successful outreach to patient to complete hospital discharge transition of care assessment. Patient was hospitalized 3/24-3/25/22 for Influenza A. She was discharged home on Tamiflu and home O2 at 2L/Min. The patient was initially referred in April 2021 to the case management team for assistance with care management and care coordination.  Patient state she has completed course of Tamiflu, is feeling better, has all of her prescribed medications.   Plan:  CCM follow up telephone assessment scheduled for 05/20/20 at 10:30 am.  Kelli Churn RN, CCM, [facility] RN Care Manager [phone]

## 2020-05-15 DIAGNOSIS — E039 Hypothyroidism, unspecified: Secondary | ICD-10-CM | POA: Diagnosis not present

## 2020-05-15 DIAGNOSIS — I10 Essential (primary) hypertension: Secondary | ICD-10-CM | POA: Diagnosis not present

## 2020-05-15 DIAGNOSIS — R42 Dizziness and giddiness: Secondary | ICD-10-CM | POA: Diagnosis not present

## 2020-05-15 DIAGNOSIS — E119 Type 2 diabetes mellitus without complications: Secondary | ICD-10-CM | POA: Diagnosis not present

## 2020-05-20 ENCOUNTER — Ambulatory Visit: Payer: Medicare Other | Admitting: *Deleted

## 2020-05-20 DIAGNOSIS — E1169 Type 2 diabetes mellitus with other specified complication: Secondary | ICD-10-CM

## 2020-05-20 DIAGNOSIS — I10 Essential (primary) hypertension: Secondary | ICD-10-CM

## 2020-05-20 DIAGNOSIS — N1832 Chronic kidney disease, stage 3b: Secondary | ICD-10-CM

## 2020-05-20 DIAGNOSIS — Z0000 Encounter for general adult medical examination without abnormal findings: Secondary | ICD-10-CM

## 2020-05-20 DIAGNOSIS — J449 Chronic obstructive pulmonary disease, unspecified: Secondary | ICD-10-CM

## 2020-05-20 NOTE — Patient Instructions (Signed)
Visit Information It was nice speaking with you today. Goals Addressed            This Visit's Progress   . Make and Keep All Appointments       Timeframe:  Long-Range Goal Priority:  High Start Date:       02/16/20                      Expected End Date:      ongoing                 Follow Up Date 06/17/20   - arrange a ride through an agency 1 week before appointment - ask family or friend for a ride - call to cancel if needed - keep a calendar with appointment dates    Why is this important?    Part of staying healthy is seeing the doctor for follow-up care.   If you forget your appointments, there are some things you can do to stay on track.    Notes: 05/20/20- patient continues to receive monthly home visits from [facility] so she does not need to see Dr Evette Doffing unless they find a problem they can't handle, she completed her annual wellness telehealth clinic visit on 04/17/20    . Set and Meet My Target A1C -Diabetes Type 2       Timeframe:  Short-Term Goal Priority:  High Start Date:        06/20/19                     Expected End Date: ongoing                      Follow Up Date 5/16//22   - set target A1C with my provider -Manage my diabetes so that I meet my target A1C   Why is this important?    Your target A1C is decided together by you and your doctor.   It is based on several things like your age and other health issues.    Notes: Patient does not check her blood sugar- will not prick her finger; she is receiving monthly home visit from [facility]. Most recent Hgb A1C drawn by [facility] on 02/22/20 = 6.1%, improved from 6.8% on 11/21/18       The patient verbalized understanding of instructions, educational materials, and care plan provided today and declined offer to receive copy of patient instructions, educational materials, and care plan.   The care management team will reach out to the patient again over the next 30-60 days.   Kelli Churn  RN, CCM, [facility] RN Care Manager [phone]

## 2020-05-20 NOTE — Chronic Care Management (AMB) (Signed)
Care Management    RN Visit Note  05/20/2020 Name: Caitlyn Aguirre MRN: [id number] DOB: 25-Jan-1936  Subjective: Caitlyn Aguirre is a 85 y.o. year old female who is a primary care patient of Caitlyn Filler, MD. The care management team was consulted for assistance with disease management and care coordination needs.    Engaged with patient by telephone for follow up visit in response to provider referral for case management and/or care coordination services.   Consent to Services:   Caitlyn Aguirre was given information about Care Management services today including:  1. Care Management services includes personalized support from designated clinical staff supervised by her physician, including individualized plan of care and coordination with other care providers 2. 24/7 contact phone numbers for assistance for urgent and routine care needs. 3. The patient may stop case management services at any time by phone call to the office staff.  Patient agreed to services and consent obtained.   Assessment: Review of patient past medical history, allergies, medications, health status, including review of consultants reports, laboratory and other test data, was performed as part of comprehensive evaluation and provision of chronic care management services.   SDOH (Social Determinants of Health) assessments and interventions performed:    Care Plan  No Known Allergies  Outpatient Encounter Medications as of 05/20/2020  Medication Sig Note  . Accu-Chek Softclix Lancets lancets Check 1 time a day as instructed   . acetaminophen (TYLENOL) 325 MG tablet Take 2 tablets (650 mg total) by mouth every 6 (six) hours as needed for mild pain (or Fever >/= 101).   Marland Kitchen albuterol (VENTOLIN HFA) 108 (90 Base) MCG/ACT inhaler Inhale 1-2 puffs into the lungs every 6 (six) hours as needed for wheezing or shortness of breath.   Marland Kitchen amLODipine (NORVASC) 5 MG tablet Take 1 tablet (5 mg total) by mouth daily.   Marland Kitchen  atorvastatin (LIPITOR) 40 MG tablet Take 1 tablet (40 mg total) by mouth daily.   . Blood Glucose Monitoring Suppl (ACCU-CHEK GUIDE ME) w/Device KIT Check 1 times a day as instructed 09/04/2019: Checks her blood sugar prn  . carvedilol (COREG) 25 MG tablet TAKE 1 TABLET BY MOUTH  TWICE DAILY WITH A MEAL (Patient taking differently: Take 25 mg by mouth 2 (two) times daily with a meal.)   . chlorthalidone (HYGROTON) 25 MG tablet TAKE 1 TABLET BY MOUTH  DAILY (Patient taking differently: Take 25 mg by mouth daily.)   . glucose blood (ACCU-CHEK GUIDE) test strip Check blood sugar 1 time per day   . ibuprofen (ADVIL) 200 MG tablet Take 200 mg by mouth daily at 6 (six) AM. 04/25/2020: Take every day per patient   . latanoprost (XALATAN) 0.005 % ophthalmic solution Place 1 drop into both eyes at bedtime.   Marland Kitchen levothyroxine (SYNTHROID) 75 MCG tablet TAKE 1 TABLET BY MOUTH EVERY DAY IN THE MORNING ON AN EMPTY STOMACH. (Patient taking differently: Take 75 mcg by mouth daily.)   . lisinopril (ZESTRIL) 40 MG tablet    . loratadine (CLARITIN) 10 MG tablet Take 1 tablet (10 mg total) by mouth daily.   . metFORMIN (GLUCOPHAGE) 1000 MG tablet TAKE 1 TABLET BY MOUTH  TWICE DAILY WITH A MEAL (Patient taking differently: Take 1,000 mg by mouth in the morning and at bedtime.)   . omeprazole (PRILOSEC) 20 MG capsule TAKE 1 CAPSULE BY MOUTH  DAILY   . oseltamivir (TAMIFLU) 30 MG capsule Take 1 capsule (30 mg total) by mouth 2 (  two) times daily.   . OXYGEN Inhale 2 L into the lungs daily as needed (for breathing).    Marland Kitchen PARoxetine (PAXIL) 30 MG tablet Take 1 tablet (30 mg total) by mouth daily.   . ramelteon (ROZEREM) 8 MG tablet Take 1 tablet (8 mg total) by mouth at bedtime.   Marland Kitchen SPIRIVA HANDIHALER 18 MCG inhalation capsule INHALE THE CONTENTS OF 1  CAPSULE BY MOUTH VIA  HANDIHALER DAILY (Patient taking differently: Place 18 mcg into inhaler and inhale daily as needed (for wheezing or shortness of breath.).) 05/20/20: Reviewed  with patient that this medicine is to be used daily not prn   No facility-administered encounter medications on file as of 05/20/2020.    Patient Active Problem List   Diagnosis Date Noted  . Influenza A 04/25/2020  . Perianal abscess 07/15/2019  . CKD (chronic kidney disease) stage 3, GFR 30-59 ml/min (HCC) 05/08/2019  . High Risk for Falls 07/18/2015  . GERD (gastroesophageal reflux disease) 06/16/2015  . Urinary, incontinence, stress female 05/09/2015  . Insomnia 05/09/2015  . Eczema of hand 06/21/2013  . Preventive measure 02/17/2013  . Obstructive sleep apnea 12/25/2011  . Hypothyroidism 01/21/2006  . Depression 01/21/2006  . Essential hypertension 01/21/2006  . COPD ([place]) 01/21/2006  . Osteoarthritis 01/21/2006  . Polymyalgia rheumatica ([place]) 01/21/2006  . Type 2 diabetes mellitus with other specified complication ([place]) 67/61/9509    Conditions to be addressed/monitored: NIDDM, HTN, COPD, CKD, HF, polymyalgia rheumatica, + covid 02/06/20, + flu A 04/25/20  Care Plan : CCM RN- Chronic disease states of : Diabetes Type 2 (Adult), HF, HTN, COPD, CKD and Polymyalgia Rheumatica  Updates made by Caitlyn Ellison, RN since 05/20/2020 12:00 AM    Problem: Disease Progression of chornic disease states   Priority: High    Long-Range Goal: Disease Progression Prevented or Minimized   Start Date: 06/19/2020  Recent Progress: On track  Priority: High  Note:   CARE PLAN ENTRY (see longitudinal plan of care for additional care plan information)  Current Barriers:  . Chronic Disease Management support, education, and care coordination needs related to CHF, HTN, HLD, COPD, DMII, CKD Stage 3, and Depression- spoke with patient to complete follow up assessment , she says she has fully recovered from Covid and flu A, says she uses her O2 at night prn, still does not want to take any vaccines, she says she continues to receive monthly visits from [facility] and they completed a visit last  week, says she was given scales by [facility] nurse and asked to weigh and record daily, She reports no more issues with sores on her buttocks, she reports good medication taking behavior, she says her daughter has not checked her blood sugar lately as patient will not stick her own finger. She did not know the difference between her Spiriva and albuterol inhaler and said she does not use an inhaler everyday only when she needs it. Patient denies recent falls and says she uses a Arts administrator" i.e. cane when she is ambulating   Clinical Goal(s) related to CHF, HTN, HLD, COPD, DMII, CKD Stage 3, and Depression:  Over the next  30-60 days, patient will:  . Work with the care management team to address educational, disease management, and care coordination needs  . Call provider office for new or worsened signs and symptoms Blood glucose findings outside established parameters, Chest pain, Shortness of breath, and New or worsened symptom related to HF, COPD, HTN or DM . Call  care management team with questions or concerns . Verbalize basic understanding of patient centered plan of care established today  Interventions related to CHF, HTN, HLD, COPD, DMII, CKD Stage 3, and Depression:  . Inter-disciplinary care team collaboration (see longitudinal plan of care) . Assessed patient's self management skills in regards to HTN, NIDDM, HLD, COPD and HF . Evaluation of current treatment plan related to HTN, NIDDM, HLD, COPD and HF and patient's adherence to plan as established by provider. . Assessed patient's recovery from Covid and symptoms requiring MD notification or emergency care . Reviewed medications with patient and assessed medication taking behavior. . Ensured patient has all prescribed medications . Teaching  with patient again regarding the difference between Spiriva and albuterol; will message [facility] staff and request they reinforce teaching re: inhalers at their next home visit  . Discussed  plans with patient for ongoing care management follow up and ensured patient has contact number for CCM team and clinic . Reviewed scheduled/upcoming provider appointments including: none showing at present . Prior to interviewing patient, reviewed patient status, including review of recent office visit notes, consultants reports, relevant laboratory and other test results, and medications.     Patient Self Care Activities related to CHF, HTN, HLD, COPD, DMII, CKD Stage 3, and Depression:  . Patient is unable to independently self-manage chronic health conditions . - set target A1C with my provider . Manage my diabetes so that I meet my target A1C . - arrange a ride through an agency 1 week before appointment . - ask family or friend for a ride . - call to cancel if needed . - keep a calendar with appointment dates     Plan: The care management team will reach out to the patient again over the next 30-60 days.  Kelli Churn RN, CCM, [facility] RN Care Manager [phone]

## 2020-05-25 DIAGNOSIS — M353 Polymyalgia rheumatica: Secondary | ICD-10-CM | POA: Diagnosis not present

## 2020-06-24 DIAGNOSIS — M353 Polymyalgia rheumatica: Secondary | ICD-10-CM | POA: Diagnosis not present

## 2020-07-04 ENCOUNTER — Telehealth: Payer: Self-pay | Admitting: *Deleted

## 2020-07-04 NOTE — Chronic Care Management (AMB) (Signed)
  Care Management   Note  07/04/2020 Name: Caitlyn Aguirre MRN: [id number] DOB: [birth year]  Caitlyn Aguirre is a 85 y.o. year old female who is a primary care patient of Axel Filler, MD and is actively engaged with the care management team. I reached out to Caitlyn Aguirre by phone today to assist with scheduling a follow up visit with the RN Case Manager  Follow up plan: Unsuccessful telephone outreach attempt made. A HIPAA compliant phone message was left for the patient providing contact information and requesting a return call. The care management team will reach out to the patient again over the next 7 days. If patient returns call to provider office, please advise to call [place] at [phone].  [place] Management

## 2020-07-07 ENCOUNTER — Other Ambulatory Visit: Payer: Self-pay | Admitting: Student in an Organized Health Care Education/Training Program

## 2020-07-07 DIAGNOSIS — I10 Essential (primary) hypertension: Secondary | ICD-10-CM

## 2020-07-11 NOTE — Chronic Care Management (AMB) (Signed)
  Care Management   Outreach Note  07/11/2020 Name: Caitlyn Aguirre MRN: [id number] DOB: 24-Jan-1936  Referred by: Axel Filler, MD Reason for referral : Care Coordination (Outreach to schedule follow up call with RNCM was unsuccessful RH list )   A second unsuccessful telephone outreach was attempted today. The patient was referred to the case management team for assistance with care management and care coordination.   Follow Up Plan: A HIPAA compliant phone message was left for the patient providing contact information and requesting a return call.  The care management team will reach out to the patient again over the next 7 days.  If patient returns call to provider office, please advise to call [place]* at [phone].*  [place] Management

## 2020-07-19 NOTE — Chronic Care Management (AMB) (Signed)
  Care Management   Note  07/19/2020 Name: Caitlyn Aguirre MRN: [id number] DOB: [birth year]  Caitlyn Aguirre is a 85 y.o. year old female who is a primary care patient of Axel Filler, MD. I reached out to Caitlyn Aguirre by phone today in response to a referral sent by Caitlyn Aguirre, Dr Evette Doffing.    Caitlyn Aguirre was given information about care management services today including:  Care management services include personalized support from designated clinical staff supervised by her physician, including individualized plan of care and coordination with other care providers 24/7 contact phone numbers for assistance for urgent and routine care needs. The patient may stop care management services at any time by phone call to the office staff.  Patient agreed to services and verbal consent obtained.   Follow up plan: Telephone appointment with care management team member scheduled for: BSW 07/25/2020 RNCM fu 08/20/2020   [place] Management  Direct Dial: [phone]

## 2020-07-25 ENCOUNTER — Ambulatory Visit: Payer: Medicare Other | Admitting: Licensed Clinical Social Worker

## 2020-07-25 DIAGNOSIS — M353 Polymyalgia rheumatica: Secondary | ICD-10-CM | POA: Diagnosis not present

## 2020-07-25 NOTE — Patient Instructions (Signed)
Visit Information  Instructions: patient will work with SW to address concerns related to Level of care concerns.  Patient was given the following information about care management and care coordination services today, agreed to services, and gave verbal consent: 1.care management/care coordination services include personalized support from designated clinical staff supervised by their physician, including individualized plan of care and coordination with other care providers 2. 24/7 contact phone numbers for assistance for urgent and routine care needs. 3. The patient may stop care management/care coordination services at any time by phone call to the office staff.  Patient verbalizes understanding of instructions provided today and agrees to view in [place].   The care management team will reach out to the patient again over the next 30 days.   [name], [place]  Social Worker [facility] Care Management  [phone]

## 2020-07-25 NOTE — Chronic Care Management (AMB) (Signed)
  Care Management   Social Work Visit Note  07/25/2020 Name: Caitlyn Aguirre MRN: [id number] DOB: 1936-01-26  Caitlyn Aguirre is a 85 y.o. year old female who sees Evette Doffing, Mallie Mussel, MD for primary care. The care management team was consulted for assistance with care management and care coordination needs related to Level of Care Concerns   Patient was given the following information about care management and care coordination services today, agreed to services, and gave verbal consent: 1.care management/care coordination services include personalized support from designated clinical staff supervised by their physician, including individualized plan of care and coordination with other care providers 2. 24/7 contact phone numbers for assistance for urgent and routine care needs. 3. The patient may stop care management/care coordination services at any time by phone call to the office staff.  Engaged with patient by telephone for initial visit in response to provider referral for social work chronic care management and care coordination services.  Assessment: Review of patient history, allergies, and health status during evaluation of patient need for care management/care coordination services.    Interventions:  Patient interviewed and appropriate assessments performed Collaborated with clinical team regarding patient needs  SW collaborated with [facility] Team requesting [place] [employer] to be completed. Application placed in blue teams box. Patient stated her home health services ended, but would like for services to continue. Patient denied any additional needs; food, transportation, housing. Patient has a support system including her two daughters.  SW informed patient she has an upcoming appointment on 07/18. Patient advise it will be difficult for her to make it to the appointment. SW will notify provider and RN Case manager.    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  SW collaborated with [place] Team requesting [place] [employer] to be completed. Application placed in blue teams box.  [name], [name]  Social Worker [facility] Care Management  [phone]

## 2020-08-06 ENCOUNTER — Encounter: Payer: Self-pay | Admitting: *Deleted

## 2020-08-07 ENCOUNTER — Other Ambulatory Visit: Payer: Self-pay | Admitting: Student in an Organized Health Care Education/Training Program

## 2020-08-07 DIAGNOSIS — K219 Gastro-esophageal reflux disease without esophagitis: Secondary | ICD-10-CM

## 2020-08-07 DIAGNOSIS — R109 Unspecified abdominal pain: Secondary | ICD-10-CM

## 2020-08-07 DIAGNOSIS — I10 Essential (primary) hypertension: Secondary | ICD-10-CM

## 2020-08-07 MED ORDER — AMLODIPINE BESYLATE 5 MG PO TABS: 5.0000 mg | ORAL_TABLET | Freq: Every day | ORAL | 3 refills | Status: DC

## 2020-08-07 MED ORDER — CARVEDILOL 25 MG PO TABS: 25.0000 mg | ORAL_TABLET | Freq: Two times a day (BID) | ORAL | 3 refills | Status: DC

## 2020-08-07 MED ORDER — LISINOPRIL 40 MG PO TABS: 40.0000 mg | ORAL_TABLET | Freq: Every day | ORAL | 3 refills | Status: DC

## 2020-08-07 MED ORDER — LEVOTHYROXINE SODIUM 75 MCG PO TABS: 75.0000 ug | ORAL_TABLET | Freq: Every day | ORAL | 3 refills | Status: DC

## 2020-08-07 MED ORDER — CHLORTHALIDONE 25 MG PO TABS: 25.0000 mg | ORAL_TABLET | Freq: Every day | ORAL | 3 refills | Status: DC

## 2020-08-07 MED ORDER — PAROXETINE HCL 30 MG PO TABS: 30.0000 mg | ORAL_TABLET | Freq: Every day | ORAL | 3 refills | Status: DC

## 2020-08-07 MED ORDER — ATORVASTATIN CALCIUM 40 MG PO TABS
40.0000 mg | ORAL_TABLET | Freq: Every day | ORAL | 3 refills | Status: DC
Start: 2020-08-07 — End: 2021-07-11

## 2020-08-07 MED ORDER — LEVOTHYROXINE SODIUM 75 MCG PO TABS: 75.0000 ug | ORAL_TABLET | Freq: Every day | ORAL | Status: DC

## 2020-08-07 MED ORDER — METFORMIN HCL 1000 MG PO TABS: 1000.0000 mg | ORAL_TABLET | Freq: Two times a day (BID) | ORAL | 3 refills | Status: DC

## 2020-08-07 MED ORDER — OMEPRAZOLE 20 MG PO CPDR: 20.0000 mg | DELAYED_RELEASE_CAPSULE | Freq: Every day | ORAL | 3 refills | Status: DC

## 2020-08-07 NOTE — Telephone Encounter (Signed)
Yes, we recently restarted lisinopril.

## 2020-08-07 NOTE — Addendum Note (Signed)
Addended by: Velora Heckler on: 08/07/2020 02:21 PM   Modules accepted: Orders

## 2020-08-07 NOTE — Telephone Encounter (Signed)
Returned call to patient. She is requesting new Rxs for all her meds be sent to [facility] and to take [facility] off list. Confirmed appt with PCP for 7/18 at 1115

## 2020-08-07 NOTE — Telephone Encounter (Signed)
Pt requesting a call back about all of her medication Refills.

## 2020-08-19 ENCOUNTER — Ambulatory Visit (INDEPENDENT_AMBULATORY_CARE_PROVIDER_SITE_OTHER): Payer: Medicare Other | Admitting: Student in an Organized Health Care Education/Training Program

## 2020-08-19 ENCOUNTER — Encounter: Payer: Self-pay | Admitting: Student in an Organized Health Care Education/Training Program

## 2020-08-19 VITALS — BP 113/56 | HR 71 | Temp 98.6°F | Ht 68.0 in | Wt 193.3 lb

## 2020-08-19 DIAGNOSIS — M353 Polymyalgia rheumatica: Secondary | ICD-10-CM

## 2020-08-19 DIAGNOSIS — E1169 Type 2 diabetes mellitus with other specified complication: Secondary | ICD-10-CM

## 2020-08-19 DIAGNOSIS — I10 Essential (primary) hypertension: Secondary | ICD-10-CM

## 2020-08-19 DIAGNOSIS — Z9181 History of falling: Secondary | ICD-10-CM | POA: Diagnosis not present

## 2020-08-19 LAB — POCT GLYCOSYLATED HEMOGLOBIN (HGB A1C): Hemoglobin A1C: 6.5 % — AB (ref 4.0–5.6)

## 2020-08-19 LAB — GLUCOSE, CAPILLARY: Glucose-Capillary: 146 mg/dL — ABNORMAL HIGH (ref 70–99)

## 2020-08-19 MED ORDER — DICLOFENAC SODIUM 1 % EX GEL
2.0000 g | Freq: Four times a day (QID) | CUTANEOUS | 2 refills | Status: DC
Start: 2020-08-19 — End: 2022-07-27

## 2020-08-19 NOTE — Assessment & Plan Note (Signed)
Blood pressure well controlled today.  Plan to continue with amlodipine 5 mg, carvedilol 25 mg twice daily, lisinopril 40 mg daily.  Labs in March showed stable CKD, will recheck at next visit.

## 2020-08-19 NOTE — Progress Notes (Signed)
   Assessment and Plan:  See Encounters tab for problem-based medical decision making.   __________________________________________________________  HPI:   85 year old person here for follow-up of diabetes and hypertension.  Doing well since I last saw her.  She has had no further skin or soft tissue infections since discontinuing prednisone.  Home health nursing has discontinued as her wounds have completely healed.  Reports good adherence with her medications and denies any adverse side effects.  No recent hospitalizations or ED visits.  Denies any recent falls.  Living at home with a family member, independent in activities of daily living.  __________________________________________________________  Problem List: Patient Active Problem List   Diagnosis Date Noted   High Risk for Falls 07/18/2015    Priority: High   Depression 01/21/2006    Priority: High   Essential hypertension 01/21/2006    Priority: High   Polymyalgia rheumatica ([place]) 01/21/2006    Priority: High   Type 2 diabetes mellitus with other specified complication ([place]) 45/36/4680    Priority: High   CKD (chronic kidney disease) stage 3, GFR 30-59 ml/min ([place]) 05/08/2019    Priority: Medium   Obstructive sleep apnea 12/25/2011    Priority: Medium   Hypothyroidism 01/21/2006    Priority: Medium   COPD ([place]) 01/21/2006    Priority: Medium   Osteoarthritis 01/21/2006    Priority: Medium   GERD (gastroesophageal reflux disease) 06/16/2015    Priority: Low   Urinary, incontinence, stress female 05/09/2015    Priority: Low   Insomnia 05/09/2015    Priority: Low   Eczema of hand 06/21/2013    Priority: Low   Preventive measure 02/17/2013    Priority: Low    Medications: Reconciled today in Epic __________________________________________________________  Physical Exam:  Vital Signs: Vitals:   08/19/20 1108  BP: (!) 113/56  Pulse: 71  Temp: 98.6 F (37 C)  TempSrc: Oral  SpO2: 97%  Weight: 193 lb  4.8 oz (87.7 kg)  Height: 5\' 8"  (1.727 m)    Gen: Well appearing, NAD CV: RRR, no murmurs Pulm: Normal effort, CTA throughout, no wheezing Ext: Warm, no edema, normal joints

## 2020-08-19 NOTE — Assessment & Plan Note (Signed)
Hemoglobin A1c under excellent control at 6.5%.  Plan to continue with metformin 1000 mg twice daily.

## 2020-08-19 NOTE — Assessment & Plan Note (Signed)
Patient continues to have difficulty with mobility due to diffuse osteoarthritis and polymyalgia rheumatica.  We ordered her a manual wheelchair which was delivered.  However she is unable to use this reliably because of the osteoarthritis in her hands and the PMR in her shoulders.  We will look into the possibility of more options to improve mobility, may benefit from PT evaluation, perhaps a motorized scooter or wheelchair could be helpful.

## 2020-08-19 NOTE — Assessment & Plan Note (Signed)
Stable symptoms since we had to discontinue prednisone due to recurrent skin and soft tissue infections.  Order to try some Voltaren gel for the osteoarthritis component on her hands and knees.

## 2020-08-20 ENCOUNTER — Ambulatory Visit: Payer: Medicare Other

## 2020-08-20 NOTE — Addendum Note (Signed)
Addended by: Lalla Brothers T on: 08/20/2020 02:24 PM   Modules accepted: Orders

## 2020-08-20 NOTE — Patient Instructions (Signed)
Visit Information   Goals Addressed             This Visit's Progress    Blood Pressure < 140/90       BP Readings from Last 3 Encounters:  08/19/20 (!) 113/56  04/26/20 105/64  04/25/20 110/69    Office visit 08/19/20 patient meeting BP goals Voices good medication taking behavior     HEMOGLOBIN A1C < 8        Lab Results  Component Value Date   HGBA1C 6.5 (A) 08/19/2020     Patient meeting A1C goal, noted she gave up eating bananas to reach goal.     LDL CALC < 100       Lab Results  Component Value Date   CHOL 188 11/29/2017   HDL 57 11/29/2017   LDLCALC 108 (H) 11/29/2017   TRIG 113 11/29/2017   CHOLHDL 3.3 11/29/2017           Make and Keep All Appointments       Timeframe:  Long-Range Goal Priority:  High Start Date:       02/16/20                      Expected End Date:      ongoing                 Follow Up Date 10/18/20   - arrange a ride through an agency 1 week before appointment - ask family or friend for a ride - call to cancel if needed - keep a calendar with appointment dates    Why is this important?   Part of staying healthy is seeing the doctor for follow-up care.  If you forget your appointments, there are some things you can do to stay on track.    Notes:        The patient verbalized understanding of instructions, educational materials, and care plan provided today and declined offer to receive copy of patient instructions, educational materials, and care plan.   Telephone follow up appointment with care management team member scheduled for: 82 days  [name], RN, BSN, CCM Care Management Coordinator [facility] Phone: [phone] / Fax: [phone]

## 2020-08-20 NOTE — Chronic Care Management (AMB) (Signed)
Care Management    RN Visit Note  08/20/2020 Name: Caitlyn Aguirre MRN: [id number] DOB: Mar 12, 1935  Subjective: Caitlyn Aguirre is a 85 y.o. year old female who is a primary care patient of Axel Filler, MD. The care management team was consulted for assistance with disease management and care coordination needs.    Engaged with patient by telephone for follow up visit in response to provider referral for case management and/or care coordination services.   Consent to Services:   Ms. Purdie was given information about Care Management services today including:  Care Management services includes personalized support from designated clinical staff supervised by her physician, including individualized plan of care and coordination with other care providers 24/7 contact phone numbers for assistance for urgent and routine care needs. The patient may stop case management services at any time by phone call to the office staff.  Patient agreed to services and consent obtained.    Assessment: Patient is currently experiencing difficulty with mobility. See Care Plan below for interventions and patient self-care actives. Follow up Plan: Patient would like continued follow-up.  CCM RNCM will outreach the patient within the next 30 days.  Patient will call office if needed prior to next encounter : Review of patient past medical history, allergies, medications, health status, including review of consultants reports, laboratory and other test data, was performed as part of comprehensive evaluation and provision of chronic care management services.   SDOH (Social Determinants of Health) assessments and interventions performed:    Care Plan  No Known Allergies  Outpatient Encounter Medications as of 08/20/2020  Medication Sig Note   Accu-Chek Softclix Lancets lancets Check 1 time a day as instructed    acetaminophen (TYLENOL) 325 MG tablet Take 2 tablets (650 mg total) by mouth every 6 (six)  hours as needed for mild pain (or Fever >/= 101).    albuterol (VENTOLIN HFA) 108 (90 Base) MCG/ACT inhaler Inhale 1-2 puffs into the lungs every 6 (six) hours as needed for wheezing or shortness of breath.    amLODipine (NORVASC) 5 MG tablet Take 1 tablet (5 mg total) by mouth daily.    atorvastatin (LIPITOR) 40 MG tablet Take 1 tablet (40 mg total) by mouth daily.    Blood Glucose Monitoring Suppl (ACCU-CHEK GUIDE ME) w/Device KIT Check 1 times a day as instructed Checks her blood sugar prn   carvedilol (COREG) 25 MG tablet Take 1 tablet (25 mg total) by mouth 2 (two) times daily with a meal.    chlorthalidone (HYGROTON) 25 MG tablet Take 1 tablet (25 mg total) by mouth daily.    diclofenac Sodium (VOLTAREN) 1 % GEL Apply 2 g topically 4 (four) times daily.    glucose blood (ACCU-CHEK GUIDE) test strip Check blood sugar 1 time per day    ibuprofen (ADVIL) 200 MG tablet Take 200 mg by mouth daily at 6 (six) AM.    latanoprost (XALATAN) 0.005 % ophthalmic solution Place 1 drop into both eyes at bedtime.    levothyroxine (SYNTHROID) 75 MCG tablet Take 1 tablet (75 mcg total) by mouth daily.    lisinopril (ZESTRIL) 40 MG tablet Take 1 tablet (40 mg total) by mouth daily.    loratadine (CLARITIN) 10 MG tablet Take 1 tablet (10 mg total) by mouth daily.    metFORMIN (GLUCOPHAGE) 1000 MG tablet Take 1 tablet (1,000 mg total) by mouth 2 (two) times daily with a meal.    omeprazole (PRILOSEC) 20 MG capsule Take  1 capsule (20 mg total) by mouth daily.    OXYGEN Inhale 2 L into the lungs daily as needed (for breathing).     PARoxetine (PAXIL) 30 MG tablet Take 1 tablet (30 mg total) by mouth daily.    ramelteon (ROZEREM) 8 MG tablet Take 1 tablet (8 mg total) by mouth at bedtime.    SPIRIVA HANDIHALER 18 MCG inhalation capsule INHALE THE CONTENTS OF 1  CAPSULE BY MOUTH VIA  HANDIHALER DAILY (Patient taking differently: Place 18 mcg into inhaler and inhale daily as needed (for wheezing or shortness of  breath.).)    No facility-administered encounter medications on file as of 08/20/2020.    Patient Active Problem List   Diagnosis Date Noted   CKD (chronic kidney disease) stage 3, GFR 30-59 ml/min (HCC) 05/08/2019   High Risk for Falls 07/18/2015   GERD (gastroesophageal reflux disease) 06/16/2015   Urinary, incontinence, stress female 05/09/2015   Insomnia 05/09/2015   Eczema of hand 06/21/2013   Preventive measure 02/17/2013   Obstructive sleep apnea 12/25/2011   Hypothyroidism 01/21/2006   Depression 01/21/2006   Essential hypertension 01/21/2006   COPD ([place]) 01/21/2006   Osteoarthritis 01/21/2006   Polymyalgia rheumatica ([place]) 01/21/2006   Type 2 diabetes mellitus with other specified complication ([place]) 76/22/6333    Conditions to be addressed/monitored: HTN and DMII  Care Plan : CCM RN- Chronic disease states of : Diabetes Type 2 (Adult), HF, HTN, COPD, CKD and Polymyalgia Rheumatica  Updates made by [name], RN since 08/20/2020 12:00 AM     Problem: Disease Progression of chronic disease states   Priority: High     Long-Range Goal: Disease Progression Prevented or Minimized   Start Date: 06/19/2020  Recent Progress: On track  Priority: High  Note:   CARE PLAN ENTRY (see longitudinal plan of care for additional care plan information)  Current Barriers:  Chronic Disease Management support, education, and care coordination needs related to CHF, HTN, HLD, COPD, DMII, CKD Stage 3, and Depression- Successful outreach to patient to discuss her progress.  Patient had follow up appointment with PCP yesterday and she is doing very well.  Patients A1C was 6.5 which is very good for her.  In discussion of changes she has made, she shared that she has given up eating bananas. Patient stated she loves bananas so she does miss eating them. Patient stated her main concern is her movement.  She said her back and hips, "give out" and she has pain.  She said it is difficult to  get around because she can't self propel her wheelchair as her arms are not strong enough. Patient asked about getting a power wheelchair or scooter.  Explained that she would need to get assessed by PT to determine the best route for her.  Will collaborate with PCP to obtain referral for PT evaluation.  Discussed with patient and she would be willing to come to the hospital for an evaluation by therapy (outpatient PT).  Clinical Goal(s) related to CHF, HTN, HLD, COPD, DMII, CKD Stage 3, and Depression:  Over the next  30-60 days, patient will:  Work with the care management team to address educational, disease management, and care coordination needs  Call provider office for new or worsened signs and symptoms Blood glucose findings outside established parameters, Chest pain, Shortness of breath, and New or worsened symptom related to HF, COPD, HTN or DM Call care management team with questions or concerns Verbalize basic understanding of patient centered plan of  care established today  Interventions related to CHF, HTN, HLD, COPD, DMII, CKD Stage 3, and Depression:  Inter-disciplinary care team collaboration (see longitudinal plan of care) Assessed patient's self management skills in regards to HTN, NIDDM, HLD, COPD and HF Evaluation of current treatment plan related to HTN, NIDDM, HLD, COPD and HF and patient's adherence to plan as established by provider. Assessed patient's recovery from Covid and symptoms requiring MD notification or emergency care Reviewed medications with patient and assessed medication taking behavior. Ensured patient has all prescribed medications Teaching  with patient again regarding the difference between Spiriva and albuterol; will message [facility] staff and request they reinforce teaching re: inhalers at their next home visit  Discussed plans with patient for ongoing care management follow up and ensured patient has contact number for CCM team and clinic Reviewed  scheduled/upcoming provider appointments including: none showing at present Prior to interviewing patient, reviewed patient status, including review of recent office visit notes, consultants reports, relevant laboratory and other test results, and medications.     Patient Self Care Activities related to CHF, HTN, HLD, COPD, DMII, CKD Stage 3, and Depression:  Patient is unable to independently self-manage chronic health conditions - set target A1C with my provider Manage my diabetes so that I meet my target A1C - arrange a ride through an agency 1 week before appointment - ask family or friend for a ride - call to cancel if needed - keep a calendar with appointment dates     Plan: Telephone follow up appointment with care management team member scheduled for:  92 days  [name], RN, BSN, CCM Care Management Coordinator [facility] Internal Medicine Phone: [phone] / Fax: [phone]

## 2020-08-21 ENCOUNTER — Other Ambulatory Visit: Payer: Self-pay | Admitting: Student in an Organized Health Care Education/Training Program

## 2020-08-21 DIAGNOSIS — K219 Gastro-esophageal reflux disease without esophagitis: Secondary | ICD-10-CM

## 2020-08-24 DIAGNOSIS — M353 Polymyalgia rheumatica: Secondary | ICD-10-CM | POA: Diagnosis not present

## 2020-08-29 ENCOUNTER — Ambulatory Visit: Payer: Medicare Other | Admitting: Licensed Clinical Social Worker

## 2020-08-29 ENCOUNTER — Telehealth: Payer: Medicare Other

## 2020-08-29 NOTE — Chronic Care Management (AMB) (Signed)
  Care Management   Social Work Visit Note  08/29/2020 Name: Caitlyn Aguirre MRN: [id number] DOB: 12/27/1935  Caitlyn Aguirre is a 85 y.o. year old female who sees Evette Doffing, Mallie Mussel, MD for primary care. The care management team was consulted for assistance with care management and care coordination needs related to [facility] Resources    Patient was given the following information about care management and care coordination services today, agreed to services, and gave verbal consent: 1.care management/care coordination services include personalized support from designated clinical staff supervised by their physician, including individualized plan of care and coordination with other care providers 2. 24/7 contact phone numbers for assistance for urgent and routine care needs. 3. The patient may stop care management/care coordination services at any time by phone call to the office staff.  Engaged with patient by telephone for follow up visit in response to provider referral for social work chronic care management and care coordination services.  Assessment: Review of patient history, allergies, and health status during evaluation of patient need for care management/care coordination services.    Interventions:  Patient interviewed and appropriate assessments performed Collaborated with clinical team regarding patient needs  SW assisted patient with obtaining a handicap license tag. Patient advised nothing further is needed. Patient confirmed she had food, transportation and a significant support system.  Patient requested to speak with RNCM regarding ; Wheel chair and physical therapy.  Patient advised SW services were not currently needed. SW gave patient contact information for SW.       Plan:  No Further follow up is needed from [place] will route message to [facility]   [name], [place]  Social Worker [facility] Care Management  [phone]

## 2020-08-29 NOTE — Patient Instructions (Signed)
Visit Information  Instructions:   Patient was given the following information about care management and care coordination services today, agreed to services, and gave verbal consent: 1.care management/care coordination services include personalized support from designated clinical staff supervised by their physician, including individualized plan of care and coordination with other care providers 2. 24/7 contact phone numbers for assistance for urgent and routine care needs. 3. The patient may stop care management/care coordination services at any time by phone call to the office staff.  Patient verbalizes understanding of instructions provided today and agrees to view in [place].   No further follow up required: Patient has SW contact information.   [name], [place]  Social Worker [facility] Care Management  [phone]

## 2020-08-30 ENCOUNTER — Ambulatory Visit: Payer: Medicare Other

## 2020-08-30 ENCOUNTER — Telehealth: Payer: Self-pay

## 2020-08-30 NOTE — Patient Instructions (Signed)
Visit Information   Goals Addressed             This Visit's Progress    Make and Keep All Appointments       Timeframe:  Long-Range Goal Priority:  High Start Date:       02/16/20                      Expected End Date:      ongoing                 Follow Up Date 11/01/20   - arrange a ride through an agency 1 week before appointment - ask family or friend for a ride - call to cancel if needed - keep a calendar with appointment dates    Why is this important?   Part of staying healthy is seeing the doctor for follow-up care.  If you forget your appointments, there are some things you can do to stay on track.    Notes:     Set and Meet My Target A1C -Diabetes Type 2       Timeframe:  Short-Term Goal Priority:  High Start Date:        06/20/19                     Expected End Date: ongoing                      Follow Up Date 11/01/2020   - set target A1C with my provider -Manage my diabetes so that I meet my target A1C   Why is this important?   Your target A1C is decided together by you and your doctor.  It is based on several things like your age and other health issues.    Notes: Patient does not check her blood sugar- will not prick her finger        The patient verbalized understanding of instructions, educational materials, and care plan provided today and declined offer to receive copy of patient instructions, educational materials, and care plan.   Telephone follow up appointment with care management team member scheduled for: 09/30/2020'@0930'$   [name], RN, BSN, CCM Care Management Coordinator [facility] Internal Medicine Phone: [phone] / Fax: [phone]

## 2020-08-30 NOTE — Chronic Care Management (AMB) (Signed)
Care Management    RN Visit Note  08/30/2020 Name: Caitlyn Aguirre MRN: [id number] DOB: 07/16/1935  Subjective: Caitlyn Aguirre is a 85 y.o. year old female who is a primary care patient of Caitlyn Filler, MD. The care management team was consulted for assistance with disease management and care coordination needs.    Engaged with patient by telephone for follow up visit in response to provider referral for case management and/or care coordination services.   Consent to Services:   [name] was given information about Care Management services today including:  Care Management services includes personalized support from designated clinical staff supervised by her physician, including individualized plan of care and coordination with other care providers 24/7 contact phone numbers for assistance for urgent and routine care needs. The patient may stop case management services at any time by phone call to the office staff.  Patient agreed to services and consent obtained.    Assessment: Patient is making progress with making and keeping appointments . See Care Plan below for interventions and patient self-care actives. Follow up Plan: Patient would like continued follow-up.  CCM RNCM will outreach the patient within the next 30 days.  Patient will call office if needed prior to next encounter : Review of patient past medical history, allergies, medications, health status, including review of consultants reports, laboratory and other test data, was performed as part of comprehensive evaluation and provision of chronic care management services.   SDOH (Social Determinants of Health) assessments and interventions performed:    Care Plan  No Known Allergies  Outpatient Encounter Medications as of 08/30/2020  Medication Sig Note   Accu-Chek Softclix Lancets lancets Check 1 time a day as instructed    acetaminophen (TYLENOL) 325 MG tablet Take 2 tablets (650 mg total) by mouth every 6  (six) hours as needed for mild pain (or Fever >/= 101).    albuterol (VENTOLIN HFA) 108 (90 Base) MCG/ACT inhaler Inhale 1-2 puffs into the lungs every 6 (six) hours as needed for wheezing or shortness of breath.    amLODipine (NORVASC) 5 MG tablet Take 1 tablet (5 mg total) by mouth daily.    atorvastatin (LIPITOR) 40 MG tablet Take 1 tablet (40 mg total) by mouth daily.    Blood Glucose Monitoring Suppl (ACCU-CHEK GUIDE ME) w/Device KIT Check 1 times a day as instructed    carvedilol (COREG) 25 MG tablet Take 1 tablet (25 mg total) by mouth 2 (two) times daily with a meal.    chlorthalidone (HYGROTON) 25 MG tablet Take 1 tablet (25 mg total) by mouth daily.    diclofenac Sodium (VOLTAREN) 1 % GEL Apply 2 g topically 4 (four) times daily.    glucose blood (ACCU-CHEK GUIDE) test strip Check blood sugar 1 time per day    ibuprofen (ADVIL) 200 MG tablet Take 200 mg by mouth daily at 6 (six) AM.    latanoprost (XALATAN) 0.005 % ophthalmic solution Place 1 drop into both eyes at bedtime.    levothyroxine (SYNTHROID) 75 MCG tablet Take 1 tablet (75 mcg total) by mouth daily.    lisinopril (ZESTRIL) 40 MG tablet Take 1 tablet (40 mg total) by mouth daily.    loratadine (CLARITIN) 10 MG tablet Take 1 tablet (10 mg total) by mouth daily.    metFORMIN (GLUCOPHAGE) 1000 MG tablet Take 1 tablet (1,000 mg total) by mouth 2 (two) times daily with a meal.    omeprazole (PRILOSEC) 20 MG capsule Take 1  capsule (20 mg total) by mouth daily.    OXYGEN Inhale 2 L into the lungs daily as needed (for breathing).     PARoxetine (PAXIL) 30 MG tablet Take 1 tablet (30 mg total) by mouth daily.    ramelteon (ROZEREM) 8 MG tablet Take 1 tablet (8 mg total) by mouth at bedtime.    SPIRIVA HANDIHALER 18 MCG inhalation capsule INHALE THE CONTENTS OF 1  CAPSULE BY MOUTH VIA  HANDIHALER DAILY (Patient taking differently: Place 18 mcg into inhaler and inhale daily as needed (for wheezing or shortness of breath.).)    No  facility-administered encounter medications on file as of 08/30/2020.    Patient Active Problem List   Diagnosis Date Noted   CKD (chronic kidney disease) stage 3, GFR 30-59 ml/min (HCC) 05/08/2019   High Risk for Falls 07/18/2015   GERD (gastroesophageal reflux disease) 06/16/2015   Urinary, incontinence, stress female 05/09/2015   Insomnia 05/09/2015   Eczema of hand 06/21/2013   Preventive measure 02/17/2013   Obstructive sleep apnea 12/25/2011   Hypothyroidism 01/21/2006   Depression 01/21/2006   Essential hypertension 01/21/2006   COPD ([place]) 01/21/2006   Osteoarthritis 01/21/2006   Polymyalgia rheumatica ([place]) 01/21/2006   Type 2 diabetes mellitus with other specified complication ([place]) 35/45/6256    Conditions to be addressed/monitored: HTN and DMII  Care Plan : CCM RN- Chronic disease states of : Diabetes Type 2 (Adult), HF, HTN, COPD, CKD and Polymyalgia Rheumatica  Updates made by [name], RN since 08/30/2020 12:00 AM     Problem: Disease Progression of chronic disease states   Priority: High     Long-Range Goal: Disease Progression Prevented or Minimized   Start Date: 06/19/2020  Recent Progress: On track  Priority: High  Note:   CARE PLAN ENTRY (see longitudinal plan of care for additional care plan information)  Current Barriers:  Chronic Disease Management support, education, and care coordination needs related to CHF, HTN, HLD, COPD, DMII, CKD Stage 3, and Depression- Successful outreach to patient to discuss her questions regarding her request for mobile wheelchair and physical therapy evaluation.  Patient had concerns that going to physical therapy would make her hurt more than she already does.   Educated the patient that the purpose of the PT evaluation was to assess her current functional status, which is needed to assess if she meets Medicare criteria for scooter or motorized wheelchair.  Patient acknowledged she understood and would call therapy to  set up appointment.  Clinical Goal(s) related to CHF, HTN, HLD, COPD, DMII, CKD Stage 3, and Depression:  Over the next  30-60 days, patient will:  Work with the care management team to address educational, disease management, and care coordination needs  Call provider office for new or worsened signs and symptoms Blood glucose findings outside established parameters, Chest pain, Shortness of breath, and New or worsened symptom related to HF, COPD, HTN or DM Call care management team with questions or concerns Verbalize basic understanding of patient centered plan of care established today  Interventions related to CHF, HTN, HLD, COPD, DMII, CKD Stage 3, and Depression:  Inter-disciplinary care team collaboration (see longitudinal plan of care) Assessed patient's self management skills in regards to HTN, NIDDM, HLD, COPD and HF Evaluation of current treatment plan related to HTN, NIDDM, HLD, COPD and HF and patient's adherence to plan as established by provider. Assessed patient's recovery from Covid and symptoms requiring MD notification or emergency care Reviewed medications with patient and assessed medication  taking behavior. Ensured patient has all prescribed medications Teaching  with patient again regarding the difference between Spiriva and albuterol; will message [facility] staff and request they reinforce teaching re: inhalers at their next home visit  Discussed plans with patient for ongoing care management follow up and ensured patient has contact number for CCM team and clinic Reviewed scheduled/upcoming provider appointments including: none showing at present Prior to interviewing patient, reviewed patient status, including review of recent office visit notes, consultants reports, relevant laboratory and other test results, and medications.   Patient Self Care Activities related to CHF, HTN, HLD, COPD, DMII, CKD Stage 3, and Depression:  Patient is unable to independently  self-manage chronic health conditions - set target A1C with my provider Manage my diabetes so that I meet my target A1C - arrange a ride through an agency 1 week before appointment - ask family or friend for a ride - call to cancel if needed - keep a calendar with appointment dates     Plan: Telephone follow up appointment with care management team member scheduled for:  34 days  [name], RN, BSN, CCM Care Management Coordinator [facility] Internal Medicine Phone: [phone] / Fax: [phone]

## 2020-08-30 NOTE — Telephone Encounter (Signed)
  Chronic Care Management   Note  08/30/2020 Name: Caitlyn Aguirre MRN: [id number] DOB: [birth year]  Received message from clinic that patient called and wanted to speak with this RNCM.  Call to patient and she thought I was the person who could schedule her physical therapy appointment.  Provided the number to patient and called PT asking for them to please contact the patient.  [name], RN, BSN, CCM Care Management Coordinator [facility] Internal Medicine Phone: [phone] / Fax: [phone]

## 2020-09-06 ENCOUNTER — Other Ambulatory Visit: Payer: Self-pay | Admitting: *Deleted

## 2020-09-06 MED ORDER — ALBUTEROL SULFATE HFA 108 (90 BASE) MCG/ACT IN AERS: 1.0000 | INHALATION_SPRAY | Freq: Four times a day (QID) | RESPIRATORY_TRACT | 0 refills | Status: DC | PRN

## 2020-09-24 DIAGNOSIS — H401131 Primary open-angle glaucoma, bilateral, mild stage: Secondary | ICD-10-CM | POA: Diagnosis not present

## 2020-09-24 DIAGNOSIS — H02403 Unspecified ptosis of bilateral eyelids: Secondary | ICD-10-CM | POA: Diagnosis not present

## 2020-09-24 DIAGNOSIS — M353 Polymyalgia rheumatica: Secondary | ICD-10-CM | POA: Diagnosis not present

## 2020-09-26 ENCOUNTER — Telehealth: Payer: Self-pay | Admitting: *Deleted

## 2020-09-26 NOTE — Telephone Encounter (Signed)
Patient called in c/o right sided h/a extending down right side of neck x 2 weeks. States it hurts to turn her head. Rates 4-5/10 at present. States she will take a tylenol to go to sleep but then the pain wakes her up. C/o tingling in hands upon waking, and blurry vision; both have been "going on for a good while." States she saw eye doctor this week. Denies CP, weakness, numbness, changes in speech, N/V. States she has not missed any doses of her BP /other meds. Does not have a home BP cuff. First available appt given for 8/29 at 1045. She states she will go to ED before appt if the h/a gets worse. She is advised to head to ED if she develops other sx as stated above. She agrees.

## 2020-09-26 NOTE — Telephone Encounter (Signed)
Sounds reasonable. Thank you. 

## 2020-09-30 ENCOUNTER — Encounter: Payer: Medicare Other | Admitting: Internal Medicine

## 2020-09-30 ENCOUNTER — Telehealth: Payer: Medicare Other

## 2020-09-30 ENCOUNTER — Ambulatory Visit: Payer: Medicare Other

## 2020-09-30 NOTE — Chronic Care Management (AMB) (Signed)
Care Management    RN Visit Note  09/30/2020 Name: Caitlyn Aguirre MRN: [id number] DOB: 14-Apr-1935  Subjective: Caitlyn Aguirre is a 85 y.o. year old female who is a primary care patient of Axel Filler, MD. The care management team was consulted for assistance with disease management and care coordination needs.    Engaged with patient by telephone for follow up visit in response to provider referral for case management and/or care coordination services.   Consent to Services:   Ms. Werk was given information about Care Management services today including:  Care Management services includes personalized support from designated clinical staff supervised by her physician, including individualized plan of care and coordination with other care providers 24/7 contact phone numbers for assistance for urgent and routine care needs. The patient may stop case management services at any time by phone call to the office staff.  Patient agreed to services and consent obtained.    Assessment: Patient is making progress with headache and right sided neck pain . See Care Plan below for interventions and patient self-care actives. Follow up Plan: Patient would like continued follow-up.  CCM RNCM will outreach the patient within the next 30 days.  Patient will call office if needed prior to next encounter : Review of patient past medical history, allergies, medications, health status, including review of consultants reports, laboratory and other test data, was performed as part of comprehensive evaluation and provision of chronic care management services.   SDOH (Social Determinants of Health) assessments and interventions performed:    Care Plan  No Known Allergies  Outpatient Encounter Medications as of 09/30/2020  Medication Sig Note   Accu-Chek Softclix Lancets lancets Check 1 time a day as instructed    acetaminophen (TYLENOL) 325 MG tablet Take 2 tablets (650 mg total) by mouth every 6  (six) hours as needed for mild pain (or Fever >/= 101).    albuterol (VENTOLIN HFA) 108 (90 Base) MCG/ACT inhaler Inhale 1-2 puffs into the lungs every 6 (six) hours as needed for wheezing or shortness of breath.    amLODipine (NORVASC) 5 MG tablet Take 1 tablet (5 mg total) by mouth daily.    atorvastatin (LIPITOR) 40 MG tablet Take 1 tablet (40 mg total) by mouth daily.    Blood Glucose Monitoring Suppl (ACCU-CHEK GUIDE ME) w/Device KIT Check 1 times a day as instructed    carvedilol (COREG) 25 MG tablet Take 1 tablet (25 mg total) by mouth 2 (two) times daily with a meal.    chlorthalidone (HYGROTON) 25 MG tablet Take 1 tablet (25 mg total) by mouth daily.    diclofenac Sodium (VOLTAREN) 1 % GEL Apply 2 g topically 4 (four) times daily.    glucose blood (ACCU-CHEK GUIDE) test strip Check blood sugar 1 time per day    ibuprofen (ADVIL) 200 MG tablet Take 200 mg by mouth daily at 6 (six) AM.    latanoprost (XALATAN) 0.005 % ophthalmic solution Place 1 drop into both eyes at bedtime.    levothyroxine (SYNTHROID) 75 MCG tablet Take 1 tablet (75 mcg total) by mouth daily.    lisinopril (ZESTRIL) 40 MG tablet Take 1 tablet (40 mg total) by mouth daily.    loratadine (CLARITIN) 10 MG tablet Take 1 tablet (10 mg total) by mouth daily.    metFORMIN (GLUCOPHAGE) 1000 MG tablet Take 1 tablet (1,000 mg total) by mouth 2 (two) times daily with a meal.    omeprazole (PRILOSEC) 20 MG capsule  Take 1 capsule (20 mg total) by mouth daily.    OXYGEN Inhale 2 L into the lungs daily as needed (for breathing).     PARoxetine (PAXIL) 30 MG tablet Take 1 tablet (30 mg total) by mouth daily.    ramelteon (ROZEREM) 8 MG tablet Take 1 tablet (8 mg total) by mouth at bedtime.    SPIRIVA HANDIHALER 18 MCG inhalation capsule INHALE THE CONTENTS OF 1  CAPSULE BY MOUTH VIA  HANDIHALER DAILY (Patient taking differently: Place 18 mcg into inhaler and inhale daily as needed (for wheezing or shortness of breath.).) 05/20/2020:  05/20/20- Again reviewed with patient this inhaler is to be used daily not prn and provided explanation of mechanism of action   No facility-administered encounter medications on file as of 09/30/2020.    Patient Active Problem List   Diagnosis Date Noted   CKD (chronic kidney disease) stage 3, GFR 30-59 ml/min (HCC) 05/08/2019   High Risk for Falls 07/18/2015   GERD (gastroesophageal reflux disease) 06/16/2015   Urinary, incontinence, stress female 05/09/2015   Insomnia 05/09/2015   Eczema of hand 06/21/2013   Preventive measure 02/17/2013   Obstructive sleep apnea 12/25/2011   Hypothyroidism 01/21/2006   Depression 01/21/2006   Essential hypertension 01/21/2006   COPD ([place]) 01/21/2006   Osteoarthritis 01/21/2006   Polymyalgia rheumatica ([place]) 01/21/2006   Type 2 diabetes mellitus with other specified complication ([place]) 62/95/2841    Conditions to be addressed/monitored: DMII, HTN, COPD  Care Plan : CCM RN- Chronic disease states of : Diabetes Type 2 (Adult), HF, HTN, COPD, CKD and Polymyalgia Rheumatica  Updates made by [name], RN since 09/30/2020 12:00 AM     Problem: Disease Progression of chronic disease states   Priority: High     Long-Range Goal: Disease Progression Prevented or Minimized   Start Date: 06/19/2020  Recent Progress: On track  Priority: High  Note:   CARE PLAN ENTRY (see longitudinal plan of care for additional care plan information)  Current Barriers:  Chronic Disease Management support, education, and care coordination needs related to CHF, HTN, HLD, COPD, DMII, CKD Stage 3, and Depression- Successful outreach to patient to discuss her current health status.  Patient states she is aware she missed her appointment at the clinic this morning for headache lasting 2 weeks.  She noted that the headache resolved over the weekend and she did not feel she needed to come into the clinic.  Patient states her breathing is doing well at this time, no  issues.  Clinical Goal(s) related to CHF, HTN, HLD, COPD, DMII, CKD Stage 3, and Depression:  Over the next  30-60 days, patient will:  Work with the care management team to address educational, disease management, and care coordination needs  Call provider office for new or worsened signs and symptoms Blood glucose findings outside established parameters, Chest pain, Shortness of breath, and New or worsened symptom related to HF, COPD, HTN or DM Call care management team with questions or concerns Verbalize basic understanding of patient centered plan of care established today  Interventions related to CHF, HTN, HLD, COPD, DMII, CKD Stage 3, and Depression:  Inter-disciplinary care team collaboration (see longitudinal plan of care) Assessed patient's self management skills in regards to HTN, NIDDM, HLD, COPD and HF- Patient shared she Is not checking her CBG as he daughter has to do it and she is not very good at getting the blood drop. Evaluation of current treatment plan related to HTN, NIDDM, HLD, COPD  and HF and patient's adherence to plan as established by provider. Reviewed medications with patient and assessed medication taking behavior-Patient is taking her medications as ordered. Ensured patient has all prescribed medications Discussed plans with patient for ongoing care management follow up and ensured patient has contact number for CCM team and clinic Reviewed scheduled/upcoming provider appointments including: none showing at present Prior to interviewing patient, reviewed patient status, including review of recent office visit notes, consultants reports, relevant laboratory and other test results, and medications.   Patient Self Care Activities related to CHF, HTN, HLD, COPD, DMII, CKD Stage 3, and Depression:  Patient is unable to independently self-manage chronic health conditions - set target A1C with my provider Manage my diabetes so that I meet my target A1C - arrange a ride  through an agency 1 week before appointment - ask family or friend for a ride - call to cancel if needed - keep a calendar with appointment dates     Plan: Telephone follow up appointment with care management team member scheduled for:  30 days.  [name], RN, BSN, CCM Care Management Coordinator [facility] Internal Medicine Phone: [phone] / Fax: [phone]

## 2020-09-30 NOTE — Patient Instructions (Signed)
Visit Information   Goals Addressed             This Visit's Progress    HEMOGLOBIN A1C < 8        Lab Results  Component Value Date   HGBA1C 6.5 (A) 08/19/2020     Patient meeting A1C goal, noted she gave up eating bananas to reach goal.     LDL CALC < 100       Lab Results  Component Value Date   CHOL 188 11/29/2017   HDL 57 11/29/2017   LDLCALC 108 (H) 11/29/2017   TRIG 113 11/29/2017   CHOLHDL 3.3 11/29/2017           Set and Meet My Target A1C -Diabetes Type 2       Timeframe:  Short-Term Goal Priority:  High Start Date:        06/20/19                     Expected End Date: ongoing                      Follow Up Date 11/01/2020   - set target A1C with my provider -Manage my diabetes so that I meet my target A1C   Why is this important?   Your target A1C is decided together by you and your doctor.  It is based on several things like your age and other health issues.    Notes: A1C from 7/18 appointment 6.5        The patient verbalized understanding of instructions, educational materials, and care plan provided today and declined offer to receive copy of patient instructions, educational materials, and care plan.   Telephone follow up appointment with care management team member scheduled for: 10/29/20'@1PM'$   [name], RN, BSN, CCM Care Management Coordinator [facility] Internal Medicine Phone: [phone] / Fax: [phone]

## 2020-10-21 ENCOUNTER — Ambulatory Visit (INDEPENDENT_AMBULATORY_CARE_PROVIDER_SITE_OTHER): Payer: Medicare Other | Admitting: Otolaryngology

## 2020-10-25 DIAGNOSIS — M353 Polymyalgia rheumatica: Secondary | ICD-10-CM | POA: Diagnosis not present

## 2020-10-29 ENCOUNTER — Telehealth: Payer: Self-pay | Admitting: Student in an Organized Health Care Education/Training Program

## 2020-10-29 ENCOUNTER — Ambulatory Visit: Payer: Medicare Other

## 2020-10-29 DIAGNOSIS — H9193 Unspecified hearing loss, bilateral: Secondary | ICD-10-CM

## 2020-10-29 NOTE — Patient Instructions (Signed)
Visit Information   Goals Addressed             This Visit's Progress    Blood Pressure < 140/90       BP Readings from Last 3 Encounters:  08/19/20 (!) 113/56  04/26/20 105/64  04/25/20 110/69         Make and Keep All Appointments       Timeframe:  [place] Goal Priority:  High Start Date:       02/16/20                      Expected End Date:      ongoing                 Follow Up Date 12/01/20   - arrange a ride through an agency 1 week before appointment - ask family or friend for a ride - call to cancel if needed - keep a calendar with appointment dates    Why is this important?   Part of staying healthy is seeing the doctor for follow-up care.  If you forget your appointments, there are some things you can do to stay on track.    Notes:     Set and Meet My Target A1C -Diabetes Type 2       Timeframe:  Short-Term Goal Priority:  High Start Date:        06/20/19                     Expected End Date: ongoing                      Follow Up Date 12/01/2020   - set target A1C with my provider -Manage my diabetes so that I meet my target A1C   Why is this important?   Your target A1C is decided together by you and your doctor.  It is based on several things like your age and other health issues.    Notes: A1C from 7/18 appointment 6.5        The patient verbalized understanding of instructions, educational materials, and care plan provided today and declined offer to receive copy of patient instructions, educational materials, and care plan.   Telephone follow up appointment with care management team member scheduled for: 12/03/20@1PM   [name], RN, BSN, CCM Care Management Coordinator [facility] Internal Medicine Phone: [phone] / Fax: [phone]

## 2020-10-29 NOTE — Telephone Encounter (Signed)
-----   Message from [name], RN sent at 10/29/2020  1:08 PM EDT ----- Good afternoon, I had a follow up with Caitlyn Aguirre this afternoon.  She said she went to [facility] to have her ears checked for hearing aids.  She was told that she needs to see an ENT.  Can you please put in a referral for her to ENT?  She has no preference where she will go.  Thanks, Alycia Rossetti, RN, BSN, CCM Care Management Coordinator [facility] Internal Medicine Phone: [phone] / Fax: [phone]

## 2020-10-29 NOTE — Telephone Encounter (Signed)
Thank you for letting me know. Yes, referral to ENT for reduced hearing is reasonable. I have placed the referral.

## 2020-10-29 NOTE — Chronic Care Management (AMB) (Signed)
Care Management    RN Visit Note  10/29/2020 Name: Caitlyn Aguirre MRN: [id number] DOB: 1935/12/25  Subjective: Caitlyn Aguirre is a 85 y.o. year old female who is a primary care patient of Axel Filler, MD. The care management team was consulted for assistance with disease management and care coordination needs.    Engaged with patient by telephone for follow up visit in response to provider referral for case management and/or care coordination services.   Consent to Services:   Caitlyn Aguirre was given information about Care Management services today including:  Care Management services includes personalized support from designated clinical staff supervised by her physician, including individualized plan of care and coordination with other care providers 24/7 contact phone numbers for assistance for urgent and routine care needs. The patient may stop case management services at any time by phone call to the office staff.  Patient agreed to services and consent obtained.   Assessment: Review of patient past medical history, allergies, medications, health status, including review of consultants reports, laboratory and other test data, was performed as part of comprehensive evaluation and provision of chronic care management services.   SDOH (Social Determinants of Health) assessments and interventions performed:    Care Plan  No Known Allergies  Outpatient Encounter Medications as of 10/29/2020  Medication Sig Note   Accu-Chek Softclix Lancets lancets Check 1 time a day as instructed    acetaminophen (TYLENOL) 325 MG tablet Take 2 tablets (650 mg total) by mouth every 6 (six) hours as needed for mild pain (or Fever >/= 101).    albuterol (VENTOLIN HFA) 108 (90 Base) MCG/ACT inhaler Inhale 1-2 puffs into the lungs every 6 (six) hours as needed for wheezing or shortness of breath.    amLODipine (NORVASC) 5 MG tablet Take 1 tablet (5 mg total) by mouth daily.    atorvastatin (LIPITOR)  40 MG tablet Take 1 tablet (40 mg total) by mouth daily.    Blood Glucose Monitoring Suppl (ACCU-CHEK GUIDE ME) w/Device KIT Check 1 times a day as instructed 09/04/2019: Checks her blood sugar prn   carvedilol (COREG) 25 MG tablet Take 1 tablet (25 mg total) by mouth 2 (two) times daily with a meal.    chlorthalidone (HYGROTON) 25 MG tablet Take 1 tablet (25 mg total) by mouth daily.    diclofenac Sodium (VOLTAREN) 1 % GEL Apply 2 g topically 4 (four) times daily.    glucose blood (ACCU-CHEK GUIDE) test strip Check blood sugar 1 time per day    ibuprofen (ADVIL) 200 MG tablet Take 200 mg by mouth daily at 6 (six) AM. 04/25/2020: Take every day per patient    latanoprost (XALATAN) 0.005 % ophthalmic solution Place 1 drop into both eyes at bedtime.    levothyroxine (SYNTHROID) 75 MCG tablet Take 1 tablet (75 mcg total) by mouth daily.    lisinopril (ZESTRIL) 40 MG tablet Take 1 tablet (40 mg total) by mouth daily.    loratadine (CLARITIN) 10 MG tablet Take 1 tablet (10 mg total) by mouth daily.    metFORMIN (GLUCOPHAGE) 1000 MG tablet Take 1 tablet (1,000 mg total) by mouth 2 (two) times daily with a meal.    omeprazole (PRILOSEC) 20 MG capsule Take 1 capsule (20 mg total) by mouth daily.    OXYGEN Inhale 2 L into the lungs daily as needed (for breathing).     PARoxetine (PAXIL) 30 MG tablet Take 1 tablet (30 mg total) by mouth daily.  ramelteon (ROZEREM) 8 MG tablet Take 1 tablet (8 mg total) by mouth at bedtime.    SPIRIVA HANDIHALER 18 MCG inhalation capsule INHALE THE CONTENTS OF 1  CAPSULE BY MOUTH VIA  HANDIHALER DAILY (Patient taking differently: Place 18 mcg into inhaler and inhale daily as needed (for wheezing or shortness of breath.).) 05/20/2020: 05/20/20- Again reviewed with patient this inhaler is to be used daily not prn and provided explanation of mechanism of action   No facility-administered encounter medications on file as of 10/29/2020.    Patient Active Problem List   Diagnosis  Date Noted   CKD (chronic kidney disease) stage 3, GFR 30-59 ml/min (HCC) 05/08/2019   High Risk for Falls 07/18/2015   GERD (gastroesophageal reflux disease) 06/16/2015   Urinary, incontinence, stress female 05/09/2015   Insomnia 05/09/2015   Eczema of hand 06/21/2013   Preventive measure 02/17/2013   Obstructive sleep apnea 12/25/2011   Hypothyroidism 01/21/2006   Depression 01/21/2006   Essential hypertension 01/21/2006   COPD ([place]) 01/21/2006   Osteoarthritis 01/21/2006   Polymyalgia rheumatica ([place]) 01/21/2006   Type 2 diabetes mellitus with other specified complication ([place]) 89/21/1941    Conditions to be addressed/monitored: HTN, COPD, and DMII  Care Plan : CCM RN- Chronic disease states of : Diabetes Type 2 (Adult), HF, HTN, COPD, CKD and Polymyalgia Rheumatica  Updates made by [name], RN since 10/29/2020 12:00 AM     Problem: Disease Progression of chronic disease states   Priority: High     Long-Range Goal: Disease Progression Prevented or Minimized   Start Date: 06/19/2020  Recent Progress: On track  Priority: High  Note:   CARE PLAN ENTRY (see longitudinal plan of care for additional care plan information)  Current Barriers:  Chronic Disease Management support, education, and care coordination needs related to CHF, HTN, HLD, COPD, DMII, CKD Stage 3, and Depression- Successful outreach to patient to discuss her current health status.  Patient stated she went to [place] to be assessed for hearing aids.  She was told she needed to see a ENT specialist and requested a referral.  Message sent to Dr. Evette Doffing with patients request for referral.  Patient stated she is not checking her CBG as she cannot manage the lancet and her daughter has been busy and has not checked it.  Encouraged patient to see she could get her daughter to commit to at least once per week.  Patient is aware of scheduled appointment on Nov 14th with PCP at 10:15. Patient states she is  not getting a flu vaccine or any other shots as she is afraid of needles.    Clinical Goal(s) related to CHF, HTN, HLD, COPD, DMII, CKD Stage 3, and Depression:  Over the next  30-60 days, patient will:  Work with the care management team to address educational, disease management, and care coordination needs  Call provider office for new or worsened signs and symptoms Blood glucose findings outside established parameters, Chest pain, Shortness of breath, and New or worsened symptom related to HF, COPD, HTN or DM Call care management team with questions or concerns Verbalize basic understanding of patient centered plan of care established today  Interventions related to CHF, HTN, HLD, COPD, DMII, CKD Stage 3, and Depression:  Inter-disciplinary care team collaboration (see longitudinal plan of care) Assessed patient's self management skills in regards to HTN, NIDDM, HLD, COPD and HF Evaluation of current treatment plan related to HTN, NIDDM, HLD, COPD and HF and patient's adherence to  plan as established by provider. Reviewed medications with patient and assessed medication taking behavior-Patient is taking her medications as ordered. Ensured patient has all prescribed medications Discussed plans with patient for ongoing care management follow up and ensured patient has contact number for CCM team and clinic Reviewed scheduled/upcoming provider appointments including: Nov 14th at 10:15 with Dr. Evette Doffing Prior to interviewing patient, reviewed patient status, including review of recent office visit notes, consultants reports, relevant laboratory and other test results, and medications.   Patient Self Care Activities related to CHF, HTN, HLD, COPD, DMII, CKD Stage 3, and Depression:  Patient is unable to independently self-manage chronic health conditions - set target A1C with my provider Manage my diabetes so that I meet my target A1C - arrange a ride through an agency 1 week before  appointment - ask family or friend for a ride - call to cancel if needed - keep a calendar with appointment dates     Plan: Telephone follow up appointment with care management team member scheduled for:  80 days  [name], RN, BSN, CCM Care Management Coordinator [facility] Internal Medicine Phone: [phone] / Fax: [phone]

## 2020-12-03 ENCOUNTER — Ambulatory Visit: Payer: Medicare Other

## 2020-12-03 NOTE — Patient Instructions (Signed)
Visit Information  The patient verbalized understanding of instructions, educational materials, and care plan provided today and declined offer to receive copy of patient instructions, educational materials, and care plan.   Telephone follow up appointment with care management team member scheduled for:12/16/20@10 :Duran, RN, BSN, [facility] Care Management Coordinator [facility] Internal Medicine Phone: [phone]: [phone]

## 2020-12-03 NOTE — Chronic Care Management (AMB) (Signed)
Care Management    RN Visit Note  12/03/2020 Name: Caitlyn Aguirre MRN: [id number] DOB: February 13, 1935  Subjective: Caitlyn Aguirre is a 85 y.o. year old female who is a primary care patient of Axel Filler, MD. The care management team was consulted for assistance with disease management and care coordination needs.    Engaged with patient by telephone for follow up visit in response to provider referral for case management and/or care coordination services.   Consent to Services:   Ms. Bonk was given information about Care Management services today including:  Care Management services includes personalized support from designated clinical staff supervised by her physician, including individualized plan of care and coordination with other care providers 24/7 contact phone numbers for assistance for urgent and routine care needs. The patient may stop case management services at any time by phone call to the office staff.  Patient agreed to services and consent obtained.   Assessment: Review of patient past medical history, allergies, medications, health status, including review of consultants reports, laboratory and other test data, was performed as part of comprehensive evaluation and provision of chronic care management services.   SDOH (Social Determinants of Health) assessments and interventions performed:    Care Plan  No Known Allergies  Outpatient Encounter Medications as of 12/03/2020  Medication Sig Note   Accu-Chek Softclix Lancets lancets Check 1 time a day as instructed    acetaminophen (TYLENOL) 325 MG tablet Take 2 tablets (650 mg total) by mouth every 6 (six) hours as needed for mild pain (or Fever >/= 101).    albuterol (VENTOLIN HFA) 108 (90 Base) MCG/ACT inhaler Inhale 1-2 puffs into the lungs every 6 (six) hours as needed for wheezing or shortness of breath.    amLODipine (NORVASC) 5 MG tablet Take 1 tablet (5 mg total) by mouth daily.    atorvastatin (LIPITOR)  40 MG tablet Take 1 tablet (40 mg total) by mouth daily.    Blood Glucose Monitoring Suppl (ACCU-CHEK GUIDE ME) w/Device KIT Check 1 times a day as instructed 09/04/2019: Checks her blood sugar prn   carvedilol (COREG) 25 MG tablet Take 1 tablet (25 mg total) by mouth 2 (two) times daily with a meal.    chlorthalidone (HYGROTON) 25 MG tablet Take 1 tablet (25 mg total) by mouth daily.    diclofenac Sodium (VOLTAREN) 1 % GEL Apply 2 g topically 4 (four) times daily.    glucose blood (ACCU-CHEK GUIDE) test strip Check blood sugar 1 time per day    ibuprofen (ADVIL) 200 MG tablet Take 200 mg by mouth daily at 6 (six) AM. 04/25/2020: Take every day per patient    latanoprost (XALATAN) 0.005 % ophthalmic solution Place 1 drop into both eyes at bedtime.    levothyroxine (SYNTHROID) 75 MCG tablet Take 1 tablet (75 mcg total) by mouth daily.    lisinopril (ZESTRIL) 40 MG tablet Take 1 tablet (40 mg total) by mouth daily.    loratadine (CLARITIN) 10 MG tablet Take 1 tablet (10 mg total) by mouth daily.    metFORMIN (GLUCOPHAGE) 1000 MG tablet Take 1 tablet (1,000 mg total) by mouth 2 (two) times daily with a meal.    omeprazole (PRILOSEC) 20 MG capsule Take 1 capsule (20 mg total) by mouth daily.    OXYGEN Inhale 2 L into the lungs daily as needed (for breathing).     PARoxetine (PAXIL) 30 MG tablet Take 1 tablet (30 mg total) by mouth daily.  ramelteon (ROZEREM) 8 MG tablet Take 1 tablet (8 mg total) by mouth at bedtime.    SPIRIVA HANDIHALER 18 MCG inhalation capsule INHALE THE CONTENTS OF 1  CAPSULE BY MOUTH VIA  HANDIHALER DAILY (Patient taking differently: Place 18 mcg into inhaler and inhale daily as needed (for wheezing or shortness of breath.).) 05/20/2020: 05/20/20- Again reviewed with patient this inhaler is to be used daily not prn and provided explanation of mechanism of action   No facility-administered encounter medications on file as of 12/03/2020.    Patient Active Problem List   Diagnosis  Date Noted   CKD (chronic kidney disease) stage 3, GFR 30-59 ml/min (HCC) 05/08/2019   High Risk for Falls 07/18/2015   GERD (gastroesophageal reflux disease) 06/16/2015   Urinary, incontinence, stress female 05/09/2015   Insomnia 05/09/2015   Eczema of hand 06/21/2013   Preventive measure 02/17/2013   Obstructive sleep apnea 12/25/2011   Hypothyroidism 01/21/2006   Depression 01/21/2006   Essential hypertension 01/21/2006   COPD ([place]) 01/21/2006   Osteoarthritis 01/21/2006   Polymyalgia rheumatica ([place]) 01/21/2006   Type 2 diabetes mellitus with other specified complication ([place]) 63/14/9702    Conditions to be addressed/monitored: HTN, COPD, and DMII  Care Plan : CCM RN- Chronic disease states of : Diabetes Type 2 (Adult), HF, HTN, COPD, CKD and Polymyalgia Rheumatica  Updates made by [name], RN since 12/03/2020 12:00 AM     Problem: Disease Progression of chronic disease states   Priority: High     Long-Range Goal: Disease Progression Prevented or Minimized   Start Date: 06/19/2020  Recent Progress: On track  Priority: High  Note:   CARE PLAN ENTRY (see longitudinal plan of care for additional care plan information)  Current Barriers:  Chronic Disease Management support, education, and care coordination needs related to CHF, HTN, HLD, COPD, DMII, CKD Stage 3, and Depression- Successful outreach to patient to discuss her current health status.  Patient stated she does have an appointment with ENT the end of this month since Dr. Evette Doffing did the referral.  Patient stated she continues to not check her CBG as she cannot manage the lancet and her daughter has been busy and has not checked it.  Patient is aware of scheduled appointment on Nov 14th with PCP at 10:15. Patient states she is not getting a flu vaccine or any other shots as she is afraid of needles.    This RNCM plans to see patient in clinic when she comes in for her PCP visit.  Clinical Goal(s) related to CHF,  HTN, HLD, COPD, DMII, CKD Stage 3, and Depression:  Over the next  30-60 days, patient will:  Work with the care management team to address educational, disease management, and care coordination needs  Call provider office for new or worsened signs and symptoms Blood glucose findings outside established parameters, Chest pain, Shortness of breath, and New or worsened symptom related to HF, COPD, HTN or DM Call care management team with questions or concerns Verbalize basic understanding of patient centered plan of care established today  Interventions related to CHF, HTN, HLD, COPD, DMII, CKD Stage 3, and Depression:  Inter-disciplinary care team collaboration (see longitudinal plan of care) Assessed patient's self management skills in regards to HTN, NIDDM, HLD, COPD and HF Evaluation of current treatment plan related to HTN, NIDDM, HLD, COPD and HF and patient's adherence to plan as established by provider. Reviewed medications with patient and assessed medication taking behavior-Patient is taking her medications as  ordered. Ensured patient has all prescribed medications Discussed plans with patient for ongoing care management follow up and ensured patient has contact number for CCM team and clinic Reviewed scheduled/upcoming provider appointments including: none showing at present Prior to interviewing patient, reviewed patient status, including review of recent office visit notes, consultants reports, relevant laboratory and other test results, and medications.   Patient Self Care Activities related to CHF, HTN, HLD, COPD, DMII, CKD Stage 3, and Depression:  Patient is unable to independently self-manage chronic health conditions - set target A1C with my provider Manage my diabetes so that I meet my target A1C - arrange a ride through an agency 1 week before appointment - ask family or friend for a ride - call to cancel if needed - keep a calendar with appointment dates     Plan:  Telephone follow up appointment with care management team member scheduled for:  12/16/20_0 :[place], RN, BSN, CCM Care Management Coordinator [facility] Internal Medicine Phone: [phone]: [phone]

## 2020-12-16 ENCOUNTER — Ambulatory Visit (INDEPENDENT_AMBULATORY_CARE_PROVIDER_SITE_OTHER): Payer: Medicare Other | Admitting: Student in an Organized Health Care Education/Training Program

## 2020-12-16 ENCOUNTER — Ambulatory Visit: Payer: Medicare Other

## 2020-12-16 ENCOUNTER — Other Ambulatory Visit: Payer: Self-pay

## 2020-12-16 VITALS — BP 137/69 | HR 64 | Temp 98.0°F | Wt 196.6 lb

## 2020-12-16 DIAGNOSIS — N1831 Chronic kidney disease, stage 3a: Secondary | ICD-10-CM | POA: Diagnosis not present

## 2020-12-16 DIAGNOSIS — E1169 Type 2 diabetes mellitus with other specified complication: Secondary | ICD-10-CM

## 2020-12-16 DIAGNOSIS — D631 Anemia in chronic kidney disease: Secondary | ICD-10-CM

## 2020-12-16 DIAGNOSIS — N1832 Chronic kidney disease, stage 3b: Secondary | ICD-10-CM | POA: Diagnosis not present

## 2020-12-16 DIAGNOSIS — I10 Essential (primary) hypertension: Secondary | ICD-10-CM | POA: Diagnosis not present

## 2020-12-16 DIAGNOSIS — E039 Hypothyroidism, unspecified: Secondary | ICD-10-CM

## 2020-12-16 DIAGNOSIS — M67912 Unspecified disorder of synovium and tendon, left shoulder: Secondary | ICD-10-CM | POA: Diagnosis not present

## 2020-12-16 DIAGNOSIS — J449 Chronic obstructive pulmonary disease, unspecified: Secondary | ICD-10-CM | POA: Diagnosis not present

## 2020-12-16 LAB — GLUCOSE, CAPILLARY: Glucose-Capillary: 147 mg/dL — ABNORMAL HIGH (ref 70–99)

## 2020-12-16 LAB — POCT GLYCOSYLATED HEMOGLOBIN (HGB A1C): Hemoglobin A1C: 6.2 % — AB (ref 4.0–5.6)

## 2020-12-16 MED ORDER — METFORMIN HCL 1000 MG PO TABS: 1000.0000 mg | ORAL_TABLET | Freq: Every day | ORAL | 3 refills | Status: DC

## 2020-12-16 NOTE — Patient Instructions (Signed)
Thank you, Caitlyn Aguirre for allowing [place] to provide your care today. Today we discussed: Oxygen: Use your oxygen throughout the house to prevent shortness of breath. We will write a prescription to renew your oxygen today. Headaches: Do not take NSAIDs (Advil, Aleve, Goody) every day. There are okay to take once in a while on bad days but can cause stomach and kidney problems if used every day. It is okay to take baby aspirin or Tylenol for headaches/pain Shoulder pain: Your symptoms most likely represent tendinitis of the rotator cuff. Diabetes: Your A1c looks great. Keep up the good work.  I have ordered the following labs for you:   Lab Orders         Glucose, capillary         BMP8+Anion Gap         CBC no Diff         TSH         POC Hbg A1C       I have ordered the following medication/changed the following medications:   Stop the following medications: Medications Discontinued During This Encounter  Medication Reason   ibuprofen (ADVIL) 200 MG tablet    chlorthalidone (HYGROTON) 25 MG tablet    metFORMIN (GLUCOPHAGE) 1000 MG tablet      Start the following medications: Meds ordered this encounter  Medications   metFORMIN (GLUCOPHAGE) 1000 MG tablet    Sig: Take 1 tablet (1,000 mg total) by mouth daily with breakfast.    Dispense:  180 tablet    Refill:  3    Requesting 1 year supply     Follow up: 3 months   Remember: Call us if you have any concerns or questions  Should you have any questions or concerns please call the internal medicine clinic at [phone].

## 2020-12-16 NOTE — Assessment & Plan Note (Addendum)
Reports using her albuterol inhaler daily, though dyspnea reported on ambulation at home.  Having some dizziness with walking and very limited functional status.  Oxygen saturation on room air at rest: 94% Oxygen saturation on room air walking: 85% Oxygen saturation on 2 L walking: 94%  Patient would benefit from supplemental oxygen to use with activity, both a concentrator for in the home use as well as portable oxygen tanks for ambulatory use.  We will continue medical management with Spiriva and as needed albuterol.

## 2020-12-16 NOTE — Patient Instructions (Signed)
Visit Information:  The patient verbalized understanding of instructions, educational materials, and care plan provided today and declined offer to receive copy of patient instructions, educational materials, and care plan.   Telephone follow up appointment with care management team member scheduled [id number]   [name], RN, BSN, CCM Care Management Coordinator [facility] Internal Medicine Phone: [phone]: [phone]

## 2020-12-16 NOTE — Assessment & Plan Note (Addendum)
Blood pressure well-controlled today at 137/69. She reports worsening dizziness when standing from a seated position and orthostatic vitals were obtained, which were intermediate, systolic blood pressure dropped from 117-103.  Patient is at a very high fall risk, will de-escalate antihypertensive regimen to lower the risk of orthostatic symptoms.  Labs show stable CKD but mild hyperkalemia at 5.4. Plan is to stop lisinopril and continue with chlorthalidone and amlodipine.

## 2020-12-16 NOTE — Assessment & Plan Note (Signed)
She reports only taking her metformin in the morning, as opposed to twice a day as prescribed. Her A1c today is 6.2% and demonstrates good control. Updated her metformin prescription to 1000mg  daily instead of 500mg  BID.

## 2020-12-16 NOTE — Assessment & Plan Note (Addendum)
She reports left shoulder pain and left arm weakness. Exam demonstrates pain with abduction, mildly reduced strength. ROM notable for ability to abduct left arm to ~90 degrees. This likely represents tendinitis of left rotator cuff.  Continue supportive care for now, no benefit to imaging, patient is not a surgical candidate due to advanced age and comorbidities.  Talked about reducing the use of NSAIDs to only 1 or 2 days/week on the bad days in order to avoid side effects.  Can use Tylenol instead, PT may be beneficial in the future.

## 2020-12-16 NOTE — Assessment & Plan Note (Addendum)
Asymptomatic.  Currently well controlled on Synthroid 75 mcg daily.  Plan to recheck TSH today.

## 2020-12-16 NOTE — Progress Notes (Signed)
  Subjective:     Patient ID: Caitlyn Aguirre, female   DOB: 1935/08/14, 85 y.o.   MRN: [id number]  Charron Coultas is an 85 y/o who presents to clinic for follow-up. Today she reports shortness of breath when moving around her house, and especially when climbing the 5 or 6 stairs to get into her house. When she exerts herself, she also feels like her heart is pounding, but this is not accompanied by any chest pain. She also reports dizziness when standing up. This has been going on for a while, but has recently worsened.  Recently, she has had headaches, and started taking 2x Aleve every morning, as well as Tylenol PM to help her sleep. Additionally, she reports new-onset shoulder pain and arm weakness in the left arm.  She takes all her medications in the morning, except for atorvastatin, which she takes at night. She only takes her metformin in the morning.   Review of Systems  Respiratory:  Positive for shortness of breath.   Cardiovascular:  Negative for chest pain and leg swelling.  Gastrointestinal:  Negative for abdominal pain.  Musculoskeletal:  Positive for arthralgias.       Left shoulder pain accompanied by left arm weakness.  Neurological:  Positive for dizziness.      Objective:   Physical Exam Constitutional:      Appearance: Normal appearance.  HENT:     Head: Normocephalic and atraumatic.  Cardiovascular:     Rate and Rhythm: Normal rate and regular rhythm.  Pulmonary:     Effort: Pulmonary effort is normal.     Comments: Some crackles noted on auscultation in lower lung fields bilaterally  Abdominal:     General: Abdomen is flat. There is no distension.     Palpations: Abdomen is soft.  Musculoskeletal:     Left shoulder: Tenderness present. No swelling or effusion.     Right lower leg: No edema.     Left lower leg: No edema.     Comments: Weakness in left arm when abducting against resistance. Able to abduct left arm ~90 degrees  Neurological:     Mental Status:  She is alert.       Assessment & Plan:    See problem-based charting.

## 2020-12-16 NOTE — Progress Notes (Addendum)
Attestation for Student Documentation:  I personally was present and performed or re-performed the history, physical exam and medical decision-making activities of this service and have verified that the service and findings are accurately documented in the student's note.  85 year old person here for follow-up of COPD and hypertension.  Having some reduced functional status at home, having some dizziness upon standing, with walking she feels fatigued easily, sometimes struggles to get around her house.  We did an ambulatory oxygen saturations and showed that she dropped to 85% on room air after walking only about 30 feet.  She has supplemental oxygen at home which she has historically only used at night.  We talked about the need to use this during the day now with any activity.  Will reorder home oxygen with these new parameters.  Also will adjust antihypertensives to reduce orthostatic symptoms, and will discontinue lisinopril.  She has some rotator cuff tendinitis in the left shoulder, no signs of a tear or septic joint, she is using too much ibuprofen on a daily basis.  We talked about the risk of side effects including gastric ulcer and kidney injury.  We will check BMP today, advised her to use ibuprofen only once or twice per week on the very bad days, for mild pain she can use Tylenol.  Axel Filler, MD 12/16/2020, 3:24 PM

## 2020-12-16 NOTE — Assessment & Plan Note (Addendum)
In the setting of longstanding COPD (diagnosed 2006) requiring oxygen, she reports shortness of breath when moving around the house, and especially when climbing the 5 or 6 stairs to her house. In the office, her SpO2 dropped to 85% during walk test on ambient air, and increased to 97% on 2L nasal cannula. Counseled the patient that given her dyspnea on ambulation around the house, she should use her O2 when ambulating at home, and not just when exerting herself walking outside as she will benefit from continued oxygen use and prevent dyspnea potentially increasing fall risk.

## 2020-12-16 NOTE — Chronic Care Management (AMB) (Signed)
Care Management    RN Visit Note  12/16/2020 Name: Caitlyn Aguirre MRN: [id number] DOB: August 31, 1935  Subjective: Caitlyn Aguirre is a 85 y.o. year old female who is a primary care patient of Caitlyn Filler, MD. The care management team was consulted for assistance with disease management and care coordination needs.    Engaged with patient face to face for follow up visit in response to provider referral for case management and/or care coordination services.   Consent to Services:   Caitlyn Aguirre was given information about Care Management services today including:  Care Management services includes personalized support from designated clinical staff supervised by her physician, including individualized plan of care and coordination with other care providers 24/7 contact phone numbers for assistance for urgent and routine care needs. The patient may stop case management services at any time by phone call to the office staff.  Patient agreed to services and consent obtained.   Assessment: Review of patient past medical history, allergies, medications, health status, including review of consultants reports, laboratory and other test data, was performed as part of comprehensive evaluation and provision of chronic care management services.   SDOH (Social Determinants of Health) assessments and interventions performed:    Care Plan  No Known Allergies  Outpatient Encounter Medications as of 12/16/2020  Medication Sig Note   Accu-Chek Softclix Lancets lancets Check 1 time a day as instructed    acetaminophen (TYLENOL) 325 MG tablet Take 2 tablets (650 mg total) by mouth every 6 (six) hours as needed for mild pain (or Fever >/= 101).    albuterol (VENTOLIN HFA) 108 (90 Base) MCG/ACT inhaler Inhale 1-2 puffs into the lungs every 6 (six) hours as needed for wheezing or shortness of breath.    amLODipine (NORVASC) 5 MG tablet Take 1 tablet (5 mg total) by mouth daily.    atorvastatin  (LIPITOR) 40 MG tablet Take 1 tablet (40 mg total) by mouth daily.    Blood Glucose Monitoring Suppl (ACCU-CHEK GUIDE ME) w/Device KIT Check 1 times a day as instructed 09/04/2019: Checks her blood sugar prn   carvedilol (COREG) 25 MG tablet Take 1 tablet (25 mg total) by mouth 2 (two) times daily with a meal.    diclofenac Sodium (VOLTAREN) 1 % GEL Apply 2 g topically 4 (four) times daily.    glucose blood (ACCU-CHEK GUIDE) test strip Check blood sugar 1 time per day    latanoprost (XALATAN) 0.005 % ophthalmic solution Place 1 drop into both eyes at bedtime.    levothyroxine (SYNTHROID) 75 MCG tablet Take 1 tablet (75 mcg total) by mouth daily.    lisinopril (ZESTRIL) 40 MG tablet Take 1 tablet (40 mg total) by mouth daily.    loratadine (CLARITIN) 10 MG tablet Take 1 tablet (10 mg total) by mouth daily.    metFORMIN (GLUCOPHAGE) 1000 MG tablet Take 1 tablet (1,000 mg total) by mouth daily with breakfast.    omeprazole (PRILOSEC) 20 MG capsule Take 1 capsule (20 mg total) by mouth daily.    OXYGEN Inhale 2 L into the lungs daily as needed (for breathing).     PARoxetine (PAXIL) 30 MG tablet Take 1 tablet (30 mg total) by mouth daily.    ramelteon (ROZEREM) 8 MG tablet Take 1 tablet (8 mg total) by mouth at bedtime.    SPIRIVA HANDIHALER 18 MCG inhalation capsule INHALE THE CONTENTS OF 1  CAPSULE BY MOUTH VIA  HANDIHALER DAILY (Patient taking differently: Place 18 mcg  into inhaler and inhale daily as needed (for wheezing or shortness of breath.).) 05/20/2020: 05/20/20- Again reviewed with patient this inhaler is to be used daily not prn and provided explanation of mechanism of action   [DISCONTINUED] chlorthalidone (HYGROTON) 25 MG tablet Take 1 tablet (25 mg total) by mouth daily.    [DISCONTINUED] ibuprofen (ADVIL) 200 MG tablet Take 200 mg by mouth daily at 6 (six) AM. 04/25/2020: Take every day per patient    [DISCONTINUED] metFORMIN (GLUCOPHAGE) 1000 MG tablet Take 1 tablet (1,000 mg total) by mouth  2 (two) times daily with a meal.    No facility-administered encounter medications on file as of 12/16/2020.    Patient Active Problem List   Diagnosis Date Noted   Oxygen dependent 12/16/2020   Left shoulder pain 12/16/2020   CKD (chronic kidney disease) stage 3, GFR 30-59 ml/min (HCC) 05/08/2019   High Risk for Falls 07/18/2015   GERD (gastroesophageal reflux disease) 06/16/2015   Urinary, incontinence, stress female 05/09/2015   Insomnia 05/09/2015   Eczema of hand 06/21/2013   Preventive measure 02/17/2013   Obstructive sleep apnea 12/25/2011   Hypothyroidism 01/21/2006   Depression 01/21/2006   Essential hypertension 01/21/2006   COPD ([place]) 01/21/2006   Osteoarthritis 01/21/2006   Polymyalgia rheumatica ([place]) 01/21/2006   Type 2 diabetes mellitus with other specified complication ([place]) 48/54/6270    Conditions to be addressed/monitored: HTN, COPD, and DMII  Care Plan : CCM RN- Chronic disease states of : Diabetes Type 2 (Adult), HF, HTN, COPD, CKD and Polymyalgia Rheumatica  Updates made by Caitlyn Killian, RN since 12/16/2020 12:00 AM     Problem: Disease Progression of chronic disease states   Priority: High     Long-Range Goal: Disease Progression Prevented or Minimized   Start Date: 06/19/2020  Recent Progress: On track  Priority: High  Note:   CARE PLAN ENTRY (see longitudinal plan of care for additional care plan information)  Current Barriers:  Chronic Disease Management support, education, and care coordination needs related to CHF, HTN, HLD, COPD, DMII, CKD Stage 3, and Depression-Successful meeting with patient in the clinic today.  She is noted to have an A1C of 6.2 and her blood pressure was 137/62.  Patients O2 sats dropped when walking in the clinic without oxygen.  MD renewed her O2 prescription. Patient wanted to discuss her insurance and the nurses who were coming out to her home through [employer], and just stopping coming.  Patient  showed this RNCM her insurance care which on the back had a number for transportation services, nurse line and customer service.  Explained that the programs though her healthcare are not "ordered" from the clinic.  It is not like home health services and she would need to contact her insurance to find out if she can restart the program or why the nurses stopped coming to her home.  Clinical Goal(s) related to CHF, HTN, HLD, COPD, DMII, CKD Stage 3, and Depression:  Over the next  30-60 days, patient will:  Work with the care management team to address educational, disease management, and care coordination needs  Call provider office for new or worsened signs and symptoms Blood glucose findings outside established parameters, Chest pain, Shortness of breath, and New or worsened symptom related to HF, COPD, HTN or DM Call care management team with questions or concerns Verbalize basic understanding of patient centered plan of care established today  Interventions related to CHF, HTN, HLD, COPD, DMII, CKD Stage 3, and  Depression:  Inter-disciplinary care team collaboration (see longitudinal plan of care) Assessed patient's self management skills in regards to HTN, NIDDM, HLD, COPD and HF Evaluation of current treatment plan related to HTN, NIDDM, HLD, COPD and HF and patient's adherence to plan as established by provider. Reviewed medications with patient and assessed medication taking behavior-Patient is taking her medications as ordered. Ensured patient has all prescribed medications Discussed plans with patient for ongoing care management follow up and ensured patient has contact number for CCM team and clinic Reviewed scheduled/upcoming provider appointments including: none showing at present Prior to interviewing patient, reviewed patient status, including review of recent office visit notes, consultants reports, relevant laboratory and other test results, and medications.   Patient Self Care  Activities related to CHF, HTN, HLD, COPD, DMII, CKD Stage 3, and Depression:  Patient is unable to independently self-manage chronic health conditions - set target A1C with my provider Manage my diabetes so that I meet my target A1C - arrange a ride through an agency 1 week before appointment - ask family or friend for a ride - call to cancel if needed - keep a calendar with appointment dates     Plan: Telephone follow up appointment with care management team member scheduled for:  31 days  Caitlyn Killian, RN, BSN, CCM Care Management Coordinator [facility] Internal Medicine Phone: [phone]: [phone]

## 2020-12-16 NOTE — Assessment & Plan Note (Signed)
Will re-check BMP, CBC today.

## 2020-12-17 LAB — TSH: TSH: 2.33 u[IU]/mL (ref 0.450–4.500)

## 2020-12-17 LAB — BMP8+ANION GAP
Anion Gap: 13 mmol/L (ref 10.0–18.0)
BUN/Creatinine Ratio: 16 (ref 12–28)
BUN: 22 mg/dL (ref 8–27)
CO2: 18 mmol/L — ABNORMAL LOW (ref 20–29)
Calcium: 10.1 mg/dL (ref 8.7–10.3)
Chloride: 111 mmol/L — ABNORMAL HIGH (ref 96–106)
Creatinine, Ser: 1.36 mg/dL — ABNORMAL HIGH (ref 0.57–1.00)
Glucose: 119 mg/dL — ABNORMAL HIGH (ref 70–99)
Potassium: 5.4 mmol/L — ABNORMAL HIGH (ref 3.5–5.2)
Sodium: 142 mmol/L (ref 134–144)
eGFR: 39 mL/min/{1.73_m2} — ABNORMAL LOW (ref >59)

## 2020-12-17 LAB — CBC
Hematocrit: 31.4 % — ABNORMAL LOW (ref 34.0–46.6)
Hemoglobin: 10.3 g/dL — ABNORMAL LOW (ref 11.1–15.9)
MCH: 27.8 pg (ref 26.6–33.0)
MCHC: 32.8 g/dL (ref 31.5–35.7)
MCV: 85 fL (ref 79–97)
Platelets: 204 10*3/uL (ref 150–450)
RBC: 3.7 x10E6/uL — ABNORMAL LOW (ref 3.77–5.28)
RDW: 15.4 % (ref 11.7–15.4)
WBC: 6.5 10*3/uL (ref 3.4–10.8)

## 2020-12-17 MED ORDER — CHLORTHALIDONE 25 MG PO TABS: 25.0000 mg | ORAL_TABLET | Freq: Every day | ORAL | 3 refills | Status: DC

## 2020-12-17 NOTE — Addendum Note (Signed)
Addended by: Lalla Brothers T on: 12/17/2020 09:17 AM   Modules accepted: Orders

## 2021-01-14 ENCOUNTER — Ambulatory Visit: Payer: Medicare Other

## 2021-01-14 ENCOUNTER — Ambulatory Visit: Payer: Self-pay | Admitting: Licensed Clinical Social Worker

## 2021-01-14 NOTE — Chronic Care Management (AMB) (Signed)
Care Management    RN Visit Note  01/14/2021 Name: Caitlyn Aguirre MRN: [id number] DOB: 27-Sep-1935  Subjective: Caitlyn Aguirre is a 85 y.o. year old female who is a primary care patient of Caitlyn Filler, MD. The care management team was consulted for assistance with disease management and care coordination needs.    Engaged with patient by telephone for follow up visit in response to provider referral for case management and/or care coordination services.   Consent to Services:   Caitlyn Aguirre was given information about Care Management services today including:  Care Management services includes personalized support from designated clinical staff supervised by her physician, including individualized plan of care and coordination with other care providers 24/7 contact phone numbers for assistance for urgent and routine care needs. The patient may stop case management services at any time by phone call to the office staff.  Patient agreed to services and consent obtained.   Assessment: Review of patient past medical history, allergies, medications, health status, including review of consultants reports, laboratory and other test data, was performed as part of comprehensive evaluation and provision of chronic care management services.   SDOH (Social Determinants of Health) assessments and interventions performed:    Care Plan  No Known Allergies  Outpatient Encounter Medications as of 01/14/2021  Medication Sig Note   Accu-Chek Softclix Lancets lancets Check 1 time a day as instructed    acetaminophen (TYLENOL) 325 MG tablet Take 2 tablets (650 mg total) by mouth every 6 (six) hours as needed for mild pain (or Fever >/= 101).    albuterol (VENTOLIN HFA) 108 (90 Base) MCG/ACT inhaler Inhale 1-2 puffs into the lungs every 6 (six) hours as needed for wheezing or shortness of breath.    amLODipine (NORVASC) 5 MG tablet Take 1 tablet (5 mg total) by mouth daily.    atorvastatin  (LIPITOR) 40 MG tablet Take 1 tablet (40 mg total) by mouth daily.    Blood Glucose Monitoring Suppl (ACCU-CHEK GUIDE ME) w/Device KIT Check 1 times a day as instructed 09/04/2019: Checks her blood sugar prn   carvedilol (COREG) 25 MG tablet Take 1 tablet (25 mg total) by mouth 2 (two) times daily with a meal.    chlorthalidone (HYGROTON) 25 MG tablet Take 1 tablet (25 mg total) by mouth daily.    diclofenac Sodium (VOLTAREN) 1 % GEL Apply 2 g topically 4 (four) times daily.    glucose blood (ACCU-CHEK GUIDE) test strip Check blood sugar 1 time per day    latanoprost (XALATAN) 0.005 % ophthalmic solution Place 1 drop into both eyes at bedtime.    levothyroxine (SYNTHROID) 75 MCG tablet Take 1 tablet (75 mcg total) by mouth daily.    loratadine (CLARITIN) 10 MG tablet Take 1 tablet (10 mg total) by mouth daily.    metFORMIN (GLUCOPHAGE) 1000 MG tablet Take 1 tablet (1,000 mg total) by mouth daily with breakfast.    omeprazole (PRILOSEC) 20 MG capsule Take 1 capsule (20 mg total) by mouth daily.    OXYGEN Inhale 2 L into the lungs daily as needed (for breathing).     PARoxetine (PAXIL) 30 MG tablet Take 1 tablet (30 mg total) by mouth daily.    ramelteon (ROZEREM) 8 MG tablet Take 1 tablet (8 mg total) by mouth at bedtime.    SPIRIVA HANDIHALER 18 MCG inhalation capsule INHALE THE CONTENTS OF 1  CAPSULE BY MOUTH VIA  HANDIHALER DAILY (Patient taking differently: Place 18 mcg into  inhaler and inhale daily as needed (for wheezing or shortness of breath.).) 05/20/2020: 05/20/20- Again reviewed with patient this inhaler is to be used daily not prn and provided explanation of mechanism of action   No facility-administered encounter medications on file as of 01/14/2021.    Patient Active Problem List   Diagnosis Date Noted   Tendinopathy of rotator cuff, left 12/16/2020   CKD (chronic kidney disease) stage 3, GFR 30-59 ml/min (HCC) 05/08/2019   High Risk for Falls 07/18/2015   GERD (gastroesophageal  reflux disease) 06/16/2015   Urinary, incontinence, stress female 05/09/2015   Insomnia 05/09/2015   Eczema of hand 06/21/2013   Preventive measure 02/17/2013   Obstructive sleep apnea 12/25/2011   Hypothyroidism 01/21/2006   Depression 01/21/2006   Essential hypertension 01/21/2006   COPD ([place]) 01/21/2006   Osteoarthritis 01/21/2006   Polymyalgia rheumatica ([place]) 01/21/2006   Type 2 diabetes mellitus with other specified complication ([place]) 04/88/8916    Conditions to be addressed/monitored: HTN, COPD, and DMII  Care Plan : CCM RN- Chronic disease states of : Diabetes Type 2 (Adult), HF, HTN, COPD, CKD and Polymyalgia Rheumatica  Updates made by Caitlyn Killian, RN since 01/14/2021 12:00 AM     Problem: Disease Progression of chronic disease states   Priority: High     Long-Range Goal: Disease Progression Prevented or Minimized   Start Date: 06/19/2020  Recent Progress: On track  Priority: High  Note:   Current Barriers: Successful outreach to patient this morning.  Patient notes that she is feeling very well, she hasn't had any episodes of dizziness.  She shared she was never able to figure out why her nurse from [employer] stopped coming to visit her.  Explained to her this RNCM could not call them as they will not speak to me due to privacy laws.  Patient is wearing her oxygen and taking her medications as ordered.  Pt's A1C was 6.2 at last office visit. Knowledge Deficits related to plan of care for management of HTN, COPD, and DMII  Chronic Disease Management support and education needs related to HTN, COPD, and DMII   RNCM Clinical Goal(s):  Patient will verbalize understanding of plan for management of HTN, COPD, and DMII as evidenced by A1C below 7. take all medications exactly as prescribed and will call provider for medication related questions as evidenced by discussions with provider and RNCM. demonstrate understanding of rationale for each prescribed  medication as evidenced by bringing medications to every appointment. continue to work with RN Care Manager to address care management and care coordination needs related to  HTN, COPD, and DMII as evidenced by adherence to CM Team Scheduled appointments not experience hospital admission as evidenced by review of EMR. Hospital Admissions in last 6 months = 0  through collaboration with RN Care manager, provider, and care team.   Interventions: 1:1 collaboration with primary care provider regarding development and update of comprehensive plan of care as evidenced by provider attestation and co-signature Inter-disciplinary care team collaboration (see longitudinal plan of care) Evaluation of current treatment plan related to  self management and patient's adherence to plan as established by provider   COPD Interventions:  (Status:  Goal on track:  Yes.) Long Term Goal Advised patient to engage in light exercise as tolerated 3-5 days a week to aid in the the management of COPD Provided education about and advised patient to utilize infection prevention strategies to reduce risk of respiratory infection Discussed the importance of adequate rest and  management of fatigue with COPD   Diabetes Interventions:  (Status:  Goal on track:  Yes.) Long Term Goal Assessed patient's understanding of A1c goal: <7% Provided education to patient about basic DM disease process Reviewed medications with patient and discussed importance of medication adherence Discussed plans with patient for ongoing care management follow up and provided patient with direct contact information for care management team Review of patient status, including review of consultants reports, relevant laboratory and other test results, and medications completed Lab Results  Component Value Date   HGBA1C 6.2 (A) 12/16/2020   Hypertension Interventions:  (Status:  Goal on track:  Yes.) Long Term Goal Last practice recorded BP readings:   BP Readings from Last 3 Encounters:  12/16/20 137/69  08/19/20 (!) 113/56  04/26/20 105/64  Most recent eGFR/CrCl:  Lab Results  Component Value Date   EGFR 39 (L) 12/16/2020    No components found for: CRCL  Evaluation of current treatment plan related to hypertension self management and patient's adherence to plan as established by provider Discussed plans with patient for ongoing care management follow up and provided patient with direct contact information for care management team Advised patient, providing education and rationale, to monitor blood pressure daily and record, calling PCP for findings outside established parameters Discussed complications of poorly controlled blood pressure such as heart disease, stroke, circulatory complications, vision complications, kidney impairment, sexual dysfunction  Patient Goals/Self-Care Activities: Take all medications as prescribed Attend all scheduled provider appointments Call pharmacy for medication refills 3-7 days in advance of running out of medications Call provider office for new concerns or questions  take the blood sugar log to all doctor visits keep feet up while sitting wash and dry feet carefully every day wear comfortable, cotton socks identify and avoid work-related triggers begin a symptom diary follow rescue plan if symptoms flare-up keep follow-up appointments: 03/24/21@10 :15 check blood pressure weekly keep a blood pressure log call doctor for signs and symptoms of high blood pressure  Follow Up Plan:  The patient has been provided with contact information for the care management team and has been advised to call with any health related questions or concerns.       Plan: The patient has been provided with contact information for the care management team and has been advised to call with any health related questions or concerns.   Caitlyn Killian, RN, BSN, CCM Care Management Coordinator [facility] Internal  Medicine Phone: [phone]: [phone]

## 2021-01-14 NOTE — Chronic Care Management (AMB) (Signed)
°  Care Management   Social Work Visit Note  01/14/2021 Name: Caitlyn Aguirre MRN: [id number] DOB: 1935/05/15  Caitlyn Aguirre is an 85 y.o. year old female who sees Evette Doffing, Mallie Mussel, MD for primary care. The care management team was consulted for assistance with care management and care coordination needs related to [facility] Resources    Patient was given the following information about care management and care coordination services today, agreed to services, and gave verbal consent: 1.care management/care coordination services include personalized support from designated clinical staff supervised by their physician, including individualized plan of care and coordination with other care providers 2. 24/7 contact phone numbers for assistance for urgent and routine care needs. 3. The patient may stop care management/care coordination services at any time by phone call to the office staff.  Engaged with patient by telephone for initial visit in response to provider referral for social work chronic care management and care coordination services.  Assessment: Review of patient history, allergies, and health status during evaluation of patient need for care management/care coordination services.    Interventions:  Patient interviewed and appropriate assessments performed Collaborated with clinical team regarding patient needs  SW briefly spoke with Patient , but due to communication barriers, the daughter Mardene Celeste took over the call.  SW inquired on [facility] services. Patient and daughter were  unfamiliar with what PCS services were. SW explained. Both patient and daughter agreed services were not needed. Patient recalled having the services in the past. Daughter stated she is the caregiver for her mother and PCS services is not needed.   SDOH was completed and patient denied needing any additional services.  Patient requested to speak with [facility] regarding an electric wheelchair. SW sent message to [facility]  regarding wheelchair request. Patient denied needing any additional support from SW. Patient denied needing any follow up.   SDOH (Social Determinants of Health) assessments performed: Yes     Plan:  No follow up is needed.  [name], [place]  Social Worker [facility] Care Management  [phone]

## 2021-01-14 NOTE — Patient Instructions (Signed)
Visit Information  Thank you for taking time to visit with me today. Please don't hesitate to contact me if I can be of assistance to you before our next scheduled telephone appointment.  Our next appointment is by telephone on 02/18/21 at 0930.  Please call the care guide team at [phone] if you need to cancel or reschedule your appointment.   If you are experiencing a Mental Health or [place] or need someone to talk to, please call the [place] National Suicide Prevention Lifeline: (810)571-7387 or TTY: (276) 349-7895 TTY 831-558-9675) to talk to a trained counselor   The patient verbalized understanding of instructions, educational materials, and care plan provided today and agreed to receive a mailed copy of patient instructions, educational materials, and care plan.   The patient has been provided with contact information for the care management team and has been advised to call with any health related questions or concerns.   [name], RN, BSN, CCM Care Management Coordinator [facility] Internal Medicine Phone: [phone]: [phone]

## 2021-01-14 NOTE — Patient Instructions (Signed)
Visit Information   Patient was given the following information about care management and care coordination services today, agreed to services, and gave verbal consent: 1.care management/care coordination services include personalized support from designated clinical staff supervised by their physician, including individualized plan of care and coordination with other care providers 2. 24/7 contact phone numbers for assistance for urgent and routine care needs. 3. The patient may stop care management/care coordination services at any time by phone call to the office staff.  Patient verbalizes understanding of instructions provided today and agrees to view in [place].   No further follow up required: Per patient request  Caitlyn Aguirre, [place] Worker [facility] Care Management  [phone]

## 2021-01-15 ENCOUNTER — Ambulatory Visit: Payer: Self-pay

## 2021-01-15 NOTE — Chronic Care Management (AMB) (Signed)
° °  01/15/2021  Caitlyn Aguirre 02-17-1935 [id number]  Patient requested call to discuss getting an electric wheelchair/scooter as she has extreme difficulty walking due to her knees and back pain.  Explained the process to [name] that she would need to come into the clinic for an evaluation by MD documenting the need for her to have an electric wheelchair or scooter.  Medicare requires a face to face appointment prior to referral for the DME.  Patient requested a call from the schedulers to set up appointment.  Sent message to [employer] staff to contact patient and schedule appointment.  Plan to follow up with patient after she has her office visit.  [name], RN, BSN, CCM Care Management Coordinator [facility] Internal Medicine Phone: [phone]: [phone]

## 2021-02-18 ENCOUNTER — Telehealth: Payer: Self-pay | Admitting: *Deleted

## 2021-02-18 ENCOUNTER — Telehealth: Payer: Medicare Other

## 2021-02-18 ENCOUNTER — Ambulatory Visit: Payer: Medicare Other | Admitting: Licensed Clinical Social Worker

## 2021-02-18 NOTE — Patient Instructions (Signed)
Visit Information  Instructions:   Patient was given the following information about care management and care coordination services today, agreed to services, and gave verbal consent: 1.care management/care coordination services include personalized support from designated clinical staff supervised by their physician, including individualized plan of care and coordination with other care providers 2. 24/7 contact phone numbers for assistance for urgent and routine care needs. 3. The patient may stop care management/care coordination services at any time by phone call to the office staff.  Patient verbalizes understanding of instructions and care plan provided today and agrees to view in [place]. Active MyChart status confirmed with patient.    No Further Follow up required.  [name], [place]  Social Worker [facility] Care Management  [phone]

## 2021-02-18 NOTE — Chronic Care Management (AMB) (Signed)
°  Care Management   Social Work Visit Note  02/18/2021 Name: Caitlyn Aguirre MRN: [id number] DOB: 1935/04/03  Caitlyn Aguirre is a 86 y.o. year old female who sees Evette Doffing, Mallie Mussel, MD for primary care. The care management team was consulted for assistance with care management and care coordination needs related to  Follow up    Patient was given the following information about care management and care coordination services today, agreed to services, and gave verbal consent: 1.care management/care coordination services include personalized support from designated clinical staff supervised by their physician, including individualized plan of care and coordination with other care providers 2. 24/7 contact phone numbers for assistance for urgent and routine care needs. 3. The patient may stop care management/care coordination services at any time by phone call to the office staff.  Engaged with patient by telephone for follow up visit in response to provider referral for social work chronic care management and care coordination services.  Assessment: Review of patient history, allergies, and health status during evaluation of patient need for care management/care coordination services.    Interventions:  Patient interviewed and appropriate assessments performed Collaborated with clinical team regarding patient needs  No interventions needed per patient. SDOH screening completed. Patient has no SW needs.   SDOH (Social Determinants of Health) assessments performed: Yes     Plan:  No further follow up required.   [name], [place]  Social Worker [facility] Care Management  [phone]

## 2021-02-18 NOTE — Chronic Care Management (AMB) (Signed)
°  Care Management   Note  02/18/2021 Name: [name] MRN: [id number] DOB: 01-23-1936  Caitlyn Aguirre is a 86 y.o. year old female who is a primary care patient of Axel Filler, MD and is actively engaged with the care management team. I reached out to Caitlyn Aguirre by phone today to assist with re-scheduling a follow up visit with the RN Case Manager  Follow up plan: Telephone appointment with care management team member scheduled for:02/25/21  [place] Management  Direct Dial: [phone]

## 2021-02-18 NOTE — Chronic Care Management (AMB) (Signed)
°  Care Management   Note  02/18/2021 Name: Caitlyn Aguirre MRN: [id number] DOB: [birth year]  Caitlyn Aguirre is a 86 y.o. year old female who is a primary care patient of Axel Filler, MD and is actively engaged with the care management team. I reached out to Caitlyn Aguirre by phone today to assist with re-scheduling a follow up visit with the RN Case Manager  Follow up plan: Unsuccessful telephone outreach attempt made. A HIPAA compliant phone message was left for the patient providing contact information and requesting a return call.  The care management team will reach out to the patient again over the next 7 days.  If patient returns call to provider office, please advise to call [place] at [phone].  [place] Management  Direct Dial: [phone]

## 2021-02-25 ENCOUNTER — Ambulatory Visit: Payer: Medicare Other

## 2021-02-25 NOTE — Patient Instructions (Signed)
Visit Information  Thank you for taking time to visit with me today. Please don't hesitate to contact me if I can be of assistance to you before our next scheduled telephone appointment.  Our next appointment is by telephone on 03/31/21@ 3PM.   Please call the care guide team at [phone] if you need to cancel or reschedule your appointment.   If you are experiencing a Mental Health or [place] or need someone to talk to, please call the [place] National Suicide Prevention Lifeline: 5861753565 or TTY: 509-568-2098 TTY (972)636-3338) to talk to a trained counselor   Patient verbalizes understanding of instructions and care plan provided today and agrees to view in [place]. Active MyChart status confirmed with patient.    The patient has been provided with contact information for the care management team and has been advised to call with any health related questions or concerns.   [name], RN, BSN, CCM Care Management Coordinator [facility] Internal Medicine Phone: [phone]: [phone]

## 2021-02-25 NOTE — Chronic Care Management (AMB) (Signed)
Care Management    RN Visit Note  02/25/2021 Name: Caitlyn Aguirre MRN: [id number] DOB: 10/29/1935  Subjective: Caitlyn AMBROCIO is a 86 y.o. year old female who is a primary care patient of Axel Filler, MD. The care management team was consulted for assistance with disease management and care coordination needs.    Engaged with patient by telephone for follow up visit in response to provider referral for case management and/or care coordination services.   Consent to Services:   Caitlyn Aguirre was given information about Care Management services today including:  Care Management services includes personalized support from designated clinical staff supervised by her physician, including individualized plan of care and coordination with other care providers 24/7 contact phone numbers for assistance for urgent and routine care needs. The patient may stop case management services at any time by phone call to the office staff.  Patient agreed to services and consent obtained.   Assessment: Review of patient past medical history, allergies, medications, health status, including review of consultants reports, laboratory and other test data, was performed as part of comprehensive evaluation and provision of chronic care management services.   SDOH (Social Determinants of Health) assessments and interventions performed:    Care Plan No Known Allergies  Outpatient Encounter Medications as of 02/25/2021  Medication Sig Note   Accu-Chek Softclix Lancets lancets Check 1 time a day as instructed    acetaminophen (TYLENOL) 325 MG tablet Take 2 tablets (650 mg total) by mouth every 6 (six) hours as needed for mild pain (or Fever >/= 101).    albuterol (VENTOLIN HFA) 108 (90 Base) MCG/ACT inhaler Inhale 1-2 puffs into the lungs every 6 (six) hours as needed for wheezing or shortness of breath.    amLODipine (NORVASC) 5 MG tablet Take 1 tablet (5 mg total) by mouth daily.    atorvastatin (LIPITOR)  40 MG tablet Take 1 tablet (40 mg total) by mouth daily.    Blood Glucose Monitoring Suppl (ACCU-CHEK GUIDE ME) w/Device KIT Check 1 times a day as instructed    carvedilol (COREG) 25 MG tablet Take 1 tablet (25 mg total) by mouth 2 (two) times daily with a meal.    chlorthalidone (HYGROTON) 25 MG tablet Take 1 tablet (25 mg total) by mouth daily.    diclofenac Sodium (VOLTAREN) 1 % GEL Apply 2 g topically 4 (four) times daily.    glucose blood (ACCU-CHEK GUIDE) test strip Check blood sugar 1 time per day    latanoprost (XALATAN) 0.005 % ophthalmic solution Place 1 drop into both eyes at bedtime.    levothyroxine (SYNTHROID) 75 MCG tablet Take 1 tablet (75 mcg total) by mouth daily.    loratadine (CLARITIN) 10 MG tablet Take 1 tablet (10 mg total) by mouth daily.    metFORMIN (GLUCOPHAGE) 1000 MG tablet Take 1 tablet (1,000 mg total) by mouth daily with breakfast.    omeprazole (PRILOSEC) 20 MG capsule Take 1 capsule (20 mg total) by mouth daily.    OXYGEN Inhale 2 L into the lungs daily as needed (for breathing).     PARoxetine (PAXIL) 30 MG tablet Take 1 tablet (30 mg total) by mouth daily.    ramelteon (ROZEREM) 8 MG tablet Take 1 tablet (8 mg total) by mouth at bedtime.    SPIRIVA HANDIHALER 18 MCG inhalation capsule INHALE THE CONTENTS OF 1  CAPSULE BY MOUTH VIA  HANDIHALER DAILY (Patient taking differently: Place 18 mcg into inhaler and inhale daily as needed (  for wheezing or shortness of breath.).)    No facility-administered encounter medications on file as of 02/25/2021.    Patient Active Problem List   Diagnosis Date Noted   Tendinopathy of rotator cuff, left 12/16/2020   CKD (chronic kidney disease) stage 3, GFR 30-59 ml/min (HCC) 05/08/2019   High Risk for Falls 07/18/2015   GERD (gastroesophageal reflux disease) 06/16/2015   Urinary, incontinence, stress female 05/09/2015   Insomnia 05/09/2015   Eczema of hand 06/21/2013   Preventive measure 02/17/2013   Obstructive sleep  apnea 12/25/2011   Hypothyroidism 01/21/2006   Depression 01/21/2006   Essential hypertension 01/21/2006   COPD ([place]) 01/21/2006   Osteoarthritis 01/21/2006   Polymyalgia rheumatica ([place]) 01/21/2006   Type 2 diabetes mellitus with other specified complication ([place]) 98/33/8250    Conditions to be addressed/monitored: HTN, COPD, and DMII  Care Plan : CCM RN- Chronic disease states of : Diabetes Type 2 (Adult), HF, HTN, COPD, CKD and Polymyalgia Rheumatica  Updates made by Caitlyn Killian, RN since 02/25/2021 12:00 AM     Problem: Disease Progression of chronic disease states   Priority: High     Long-Range Goal: Disease Progression Prevented or Minimized   Start Date: 06/19/2020  Recent Progress: On track  Priority: High  Note:   Current Barriers: Successful outreach to patient this afternoon.  Patient notes that she is feeling very well, no issues with her breathing and her diabetes A!C is good at 6.2.  We discussed her upcoming appointment with Dr. Evette Doffing on 03/27/21@10 :15. Knowledge Deficits related to plan of care for management of HTN, COPD, and DMII  Chronic Disease Management support and education needs related to HTN, COPD, and DMII  RNCM Clinical Goal(s):  Patient will verbalize understanding of plan for management of HTN, COPD, and DMII as evidenced by A1C below 7. take all medications exactly as prescribed and will call provider for medication related questions as evidenced by discussions with provider and RNCM. demonstrate understanding of rationale for each prescribed medication as evidenced by bringing medications to every appointment. continue to work with RN Care Manager to address care management and care coordination needs related to  HTN, COPD, and DMII as evidenced by adherence to CM Team Scheduled appointments not experience hospital admission as evidenced by review of EMR. Hospital Admissions in last 6 months = 0  through collaboration with RN Care manager,  provider, and care team.  Interventions: 1:1 collaboration with primary care provider regarding development and update of comprehensive plan of care as evidenced by provider attestation and co-signature Inter-disciplinary care team collaboration (see longitudinal plan of care) Evaluation of current treatment plan related to  self management and patient's adherence to plan as established by provider COPD Interventions:  (Status:  Goal on track:  Yes.) Long Term Goal Advised patient to engage in light exercise as tolerated 3-5 days a week to aid in the the management of COPD Provided education about and advised patient to utilize infection prevention strategies to reduce risk of respiratory infection Discussed the importance of adequate rest and management of fatigue with COPD Diabetes Interventions:  (Status:  Goal on track:  Yes.) Long Term Goal Assessed patient's understanding of A1c goal: <7% Provided education to patient about basic DM disease process Reviewed medications with patient and discussed importance of medication adherence Discussed plans with patient for ongoing care management follow up and provided patient with direct contact information for care management team Review of patient status, including review of consultants reports, relevant laboratory and  other test results, and medications completed Lab Results  Component Value Date   HGBA1C 6.2 (A) 12/16/2020  Hypertension Interventions:  (Status:  Goal on track:  Yes.) Long Term Goal Last practice recorded BP readings:  BP Readings from Last 3 Encounters:  12/16/20 137/69  08/19/20 (!) 113/56  04/26/20 105/64  Most recent eGFR/CrCl:  Lab Results  Component Value Date   EGFR 39 (L) 12/16/2020    No components found for: CRCL  Evaluation of current treatment plan related to hypertension self management and patient's adherence to plan as established by provider Discussed plans with patient for ongoing care management follow  up and provided patient with direct contact information for care management team Advised patient, providing education and rationale, to monitor blood pressure daily and record, calling PCP for findings outside established parameters Discussed complications of poorly controlled blood pressure such as heart disease, stroke, circulatory complications, vision complications, kidney impairment, sexual dysfunction  Patient Goals/Self-Care Activities: Take all medications as prescribed Attend all scheduled provider appointments Call pharmacy for medication refills 3-7 days in advance of running out of medications Call provider office for new concerns or questions  take the blood sugar log to all doctor visits keep feet up while sitting wash and dry feet carefully every day wear comfortable, cotton socks identify and avoid work-related triggers begin a symptom diary follow rescue plan if symptoms flare-up keep follow-up appointments: 03/27/21@10 :15 check blood pressure weekly keep a blood pressure log call doctor for signs and symptoms of high blood pressure  Follow Up Plan:  The patient has been provided with contact information for the care management team and has been advised to call with any health related questions or concerns.       Plan: The patient has been provided with contact information for the care management team and has been advised to call with any health related questions or concerns.   Caitlyn Killian, RN, BSN, CCM Care Management Coordinator [facility] Internal Medicine Phone: [phone]: [phone]

## 2021-03-24 ENCOUNTER — Ambulatory Visit (INDEPENDENT_AMBULATORY_CARE_PROVIDER_SITE_OTHER): Payer: Medicare Other | Admitting: Student in an Organized Health Care Education/Training Program

## 2021-03-24 ENCOUNTER — Encounter: Payer: Self-pay | Admitting: Student in an Organized Health Care Education/Training Program

## 2021-03-24 VITALS — BP 118/61 | HR 73 | Temp 99.3°F | Ht 68.0 in | Wt 195.5 lb

## 2021-03-24 DIAGNOSIS — J209 Acute bronchitis, unspecified: Secondary | ICD-10-CM | POA: Diagnosis not present

## 2021-03-24 DIAGNOSIS — E1169 Type 2 diabetes mellitus with other specified complication: Secondary | ICD-10-CM

## 2021-03-24 DIAGNOSIS — M67912 Unspecified disorder of synovium and tendon, left shoulder: Secondary | ICD-10-CM | POA: Diagnosis not present

## 2021-03-24 LAB — POCT GLYCOSYLATED HEMOGLOBIN (HGB A1C): Hemoglobin A1C: 6.5 % — AB (ref 4.0–5.6)

## 2021-03-24 LAB — GLUCOSE, CAPILLARY: Glucose-Capillary: 202 mg/dL — ABNORMAL HIGH (ref 70–99)

## 2021-03-24 MED ORDER — BENZONATATE 100 MG PO CAPS: 100.0000 mg | ORAL_CAPSULE | Freq: Three times a day (TID) | ORAL | 1 refills | Status: AC | PRN

## 2021-03-24 NOTE — Assessment & Plan Note (Signed)
Left shoulder pain seems to be worsening, now has pretty significant impingement syndrome.  No signs of rotator cuff tear, also no signs of joint effusion or osteoarthritis of the glenohumeral joint.  I think this is probably still rotator cuff tendinopathy.  She reports inadequate relief with oral NSAIDs, pain has become function limiting.  We decided to do a subacromial steroid injection for treatment of tendinopathy.  We will follow-up with the patient in 3 months to see if this improves her functional status.   Shoulder Injection Procedure Note  Pre-operative Diagnosis: left rotator cuff tendinopathy  Indications: Unexplained monoarthritis  Anesthesia: not required   Procedure Details   Point of care ultrasound was used to identify the subacromial bursa and evaluate for rotator cuff tendinopathy or tears. Consent was obtained for the procedure. The shoulder was prepped with iodine. Using a 22 gauge needle the subacromial bursa was injected with 2 mL 1% lidocaine and 40 mg of triamcinolone (KENALOG) 40mg /ml. The needle was removed and a dressing was applied.  Complications:  None; patient tolerated the procedure well.

## 2021-03-24 NOTE — Assessment & Plan Note (Signed)
Symptoms and exam today consistent with acute bronchitis lasting only about 2 weeks.  No high risk features to suggest pneumonia, do not think we need imaging at this time.  We will plan to treat supportively, prescribe Tessalon to help especially at night.  Work to set reasonable expectations.  She is getting continue using supplemental oxygen as needed at home.  Gave return precautions if she develops any worsening or new symptoms.

## 2021-03-24 NOTE — Progress Notes (Signed)
° °  Assessment and Plan:  See Encounters tab for problem-based medical decision making.   __________________________________________________________  HPI:   86 year old person living with hypertension and diabetes here with acute concerns of cough and left shoulder pain.  She reports having congestion in her chest for about 2 weeks now.  She has a cough that is productive of a small amount of sputum.  She feels like she cannot get all the sputum up that she needs to.  No fevers or chills.  No dyspnea with exertion.  No increase in her supplemental oxygen requirements.  No hemoptysis.  Only producing a very small amount of sputum.  No recent sick contacts.  Has not tested herself for COVID.  Left shoulder pain continues to be a significant issue.  Reports that she can no longer abduct her shoulder to even 90 degrees.  Says that she cannot do simple tasks like picking up objects or flushing the toilet with that arm.  Making it difficult for her to dress herself.  Has tried ibuprofen in the past did not find it helpful.  No falls or trauma to the area.  __________________________________________________________  Problem List: Patient Active Problem List   Diagnosis Date Noted   High Risk for Falls 07/18/2015    Priority: High   Depression 01/21/2006    Priority: High   Essential hypertension 01/21/2006    Priority: High   Polymyalgia rheumatica ([place]) 01/21/2006    Priority: High   Type 2 diabetes mellitus with other specified complication ([place]) 08/67/6195    Priority: High   CKD (chronic kidney disease) stage 3, GFR 30-59 ml/min ([place]) 05/08/2019    Priority: Medium    Obstructive sleep apnea 12/25/2011    Priority: Medium    Hypothyroidism 01/21/2006    Priority: Medium    COPD ([place]) 01/21/2006    Priority: Medium    Osteoarthritis 01/21/2006    Priority: Medium    GERD (gastroesophageal reflux disease) 06/16/2015    Priority: Low   Urinary, incontinence, stress female 05/09/2015     Priority: Low   Insomnia 05/09/2015    Priority: Low   Eczema of hand 06/21/2013    Priority: Low   Preventive measure 02/17/2013    Priority: Low   Acute bronchitis 03/24/2021   Tendinopathy of rotator cuff, left 12/16/2020    Medications: Reconciled today in Epic __________________________________________________________  Physical Exam:  Vital Signs: Vitals:   03/24/21 0949  BP: 118/61  Pulse: 73  Temp: 99.3 F (37.4 C)  TempSrc: Oral  SpO2: 96%  Weight: 195 lb 8 oz (88.7 kg)  Height: 5\' 8"  (1.727 m)    Gen: Well appearing, NAD CV: RRR, no murmurs Pulm: Normal effort, CTA throughout, no wheezing Ext: Left shoulder without deformity, no swelling, no redness.  She can abduct to about 70 degrees, moderate to severe impingement syndrome present.  Good passive range of motion without pain.  She has pain with external and internal rotation against resistance.  Also pain with empty can test.   On ultrasounds the anterior long head biceps tendon appears to have some mild inflammation.  There is a small amount of fluid in the subacromial bursa.  No glenohumeral joint effusion seen posteriorly.

## 2021-03-25 ENCOUNTER — Telehealth: Payer: Self-pay | Admitting: Student in an Organized Health Care Education/Training Program

## 2021-03-25 LAB — BMP8+ANION GAP
Anion Gap: 15 mmol/L (ref 10.0–18.0)
BUN/Creatinine Ratio: 18 (ref 12–28)
BUN: 25 mg/dL (ref 8–27)
CO2: 18 mmol/L — ABNORMAL LOW (ref 20–29)
Calcium: 9.9 mg/dL (ref 8.7–10.3)
Chloride: 109 mmol/L — ABNORMAL HIGH (ref 96–106)
Creatinine, Ser: 1.39 mg/dL — ABNORMAL HIGH (ref 0.57–1.00)
Glucose: 184 mg/dL — ABNORMAL HIGH (ref 70–99)
Potassium: 5.2 mmol/L (ref 3.5–5.2)
Sodium: 142 mmol/L (ref 134–144)
eGFR: 38 mL/min/{1.73_m2} — ABNORMAL LOW (ref >59)

## 2021-03-25 NOTE — Telephone Encounter (Signed)
Called patient to follow up on shoulder injection from yesterday. She is doing well today, a little sore, but feeling somewhat better. She is hopeful that this will give her improvements in function. Labs look ok, hyperkalemia is a little better off ACE-I which is fine. Will follow up with her in three months.

## 2021-03-31 ENCOUNTER — Other Ambulatory Visit: Payer: Self-pay | Admitting: Student in an Organized Health Care Education/Training Program

## 2021-03-31 ENCOUNTER — Ambulatory Visit: Payer: Medicare Other

## 2021-03-31 MED ORDER — ALBUTEROL SULFATE HFA 108 (90 BASE) MCG/ACT IN AERS: 1.0000 | INHALATION_SPRAY | Freq: Four times a day (QID) | RESPIRATORY_TRACT | 5 refills | Status: DC | PRN

## 2021-03-31 NOTE — Patient Instructions (Signed)
Visit Information  Thank you for taking time to visit with me today. Please don't hesitate to contact me if I can be of assistance to you before our next scheduled telephone appointment.  Our next appointment is by telephone on 05/26/21 at [facility].  Please call the care guide team at [phone] if you need to cancel or reschedule your appointment.   If you are experiencing a Mental Health or [place] or need someone to talk to, please call the [place] National Suicide Prevention Lifeline: 430 319 1647 or TTY: [phone] TTY [phone]) to talk to a trained counselor   Patient verbalizes understanding of instructions and care plan provided today and agrees to view in [place]. Active MyChart status confirmed with patient.    The patient has been provided with contact information for the care management team and has been advised to call with any health related questions or concerns.   [name], RN, BSN, CCM Care Management Coordinator [facility] Internal Medicine Phone: [phone]: [phone]

## 2021-03-31 NOTE — Chronic Care Management (AMB) (Signed)
Care Management    RN Visit Note  03/31/2021 Name: Caitlyn Aguirre MRN: [id number] DOB: [birth year]  Subjective: Caitlyn Aguirre is a 86 y.o. year old female who is a primary care patient of Caitlyn Filler, MD. The care management team was consulted for assistance with disease management and care coordination needs.    Engaged with patient by telephone for follow up visit in response to provider referral for case management and/or care coordination services.   Consent to Services:   Caitlyn Aguirre was given information about Care Management services today including:  Care Management services includes personalized support from designated clinical staff supervised by her physician, including individualized plan of care and coordination with other care providers 24/7 contact phone numbers for assistance for urgent and routine care needs. The patient may stop case management services at any time by phone call to the office staff.  Patient agreed to services and consent obtained.   Assessment: Review of patient past medical history, allergies, medications, health status, including review of consultants reports, laboratory and other test data, was performed as part of comprehensive evaluation and provision of chronic care management services.   SDOH (Social Determinants of Health) assessments and interventions performed:    Care Plan  No Known Allergies  Outpatient Encounter Medications as of 03/31/2021  Medication Sig Note   Accu-Chek Softclix Lancets lancets Check 1 time a day as instructed    acetaminophen (TYLENOL) 325 MG tablet Take 2 tablets (650 mg total) by mouth every 6 (six) hours as needed for mild pain (or Fever >/= 101).    amLODipine (NORVASC) 5 MG tablet Take 1 tablet (5 mg total) by mouth daily.    atorvastatin (LIPITOR) 40 MG tablet Take 1 tablet (40 mg total) by mouth daily.    benzonatate (TESSALON PERLES) 100 MG capsule Take 1 capsule (100 mg total) by mouth 3 (three)  times daily as needed for cough.    Blood Glucose Monitoring Suppl (ACCU-CHEK GUIDE ME) w/Device KIT Check 1 times a day as instructed 09/04/2019: Checks her blood sugar prn   carvedilol (COREG) 25 MG tablet Take 1 tablet (25 mg total) by mouth 2 (two) times daily with a meal.    chlorthalidone (HYGROTON) 25 MG tablet Take 1 tablet (25 mg total) by mouth daily.    diclofenac Sodium (VOLTAREN) 1 % GEL Apply 2 g topically 4 (four) times daily.    glucose blood (ACCU-CHEK GUIDE) test strip Check blood sugar 1 time per day    latanoprost (XALATAN) 0.005 % ophthalmic solution Place 1 drop into both eyes at bedtime.    levothyroxine (SYNTHROID) 75 MCG tablet Take 1 tablet (75 mcg total) by mouth daily.    loratadine (CLARITIN) 10 MG tablet Take 1 tablet (10 mg total) by mouth daily.    metFORMIN (GLUCOPHAGE) 1000 MG tablet Take 1 tablet (1,000 mg total) by mouth daily with breakfast.    omeprazole (PRILOSEC) 20 MG capsule Take 1 capsule (20 mg total) by mouth daily.    OXYGEN Inhale 2 L into the lungs daily as needed (for breathing).     PARoxetine (PAXIL) 30 MG tablet Take 1 tablet (30 mg total) by mouth daily.    ramelteon (ROZEREM) 8 MG tablet Take 1 tablet (8 mg total) by mouth at bedtime.    SPIRIVA HANDIHALER 18 MCG inhalation capsule INHALE THE CONTENTS OF 1  CAPSULE BY MOUTH VIA  HANDIHALER DAILY (Patient taking differently: Place 18 mcg into inhaler and inhale daily  as needed (for wheezing or shortness of breath.).) 05/20/2020: 05/20/20- Again reviewed with patient this inhaler is to be used daily not prn and provided explanation of mechanism of action   [DISCONTINUED] albuterol (VENTOLIN HFA) 108 (90 Base) MCG/ACT inhaler Inhale 1-2 puffs into the lungs every 6 (six) hours as needed for wheezing or shortness of breath.    No facility-administered encounter medications on file as of 03/31/2021.    Patient Active Problem List   Diagnosis Date Noted   Acute bronchitis 03/24/2021   Tendinopathy of  rotator cuff, left 12/16/2020   CKD (chronic kidney disease) stage 3, GFR 30-59 ml/min (HCC) 05/08/2019   High Risk for Falls 07/18/2015   GERD (gastroesophageal reflux disease) 06/16/2015   Urinary, incontinence, stress female 05/09/2015   Insomnia 05/09/2015   Eczema of hand 06/21/2013   Preventive measure 02/17/2013   Obstructive sleep apnea 12/25/2011   Hypothyroidism 01/21/2006   Depression 01/21/2006   Essential hypertension 01/21/2006   COPD ([place]) 01/21/2006   Osteoarthritis 01/21/2006   Polymyalgia rheumatica ([place]) 01/21/2006   Type 2 diabetes mellitus with other specified complication ([employer]) 26/71/2458    Conditions to be addressed/monitored: HTN, COPD, and DMII  Care Plan : CCM RN- Chronic disease states of : Diabetes Type 2 (Adult), HF, HTN, COPD, CKD and Polymyalgia Rheumatica  Updates made by [name], RN since 03/31/2021 12:00 AM     Problem: Disease Progression of chronic disease states   Priority: High     Long-Range Goal: Disease Progression Prevented or Minimized   Start Date: 06/19/2020  Recent Progress: On track  Priority: High  Note:   Current Barriers: Successful outreach to patient this afternoon.  Patient shared that the cold she has had is getting much better. She feels the tessalon pearls are helping with her cough at night and her symptoms are much improved.  We also discussed her left shoulder pain which Dr. Evette Doffing feels is impingement syndrome and the subacromial steroid injection he gave her at the last office visit.  Patient states that every day her shoulder improving since the injection.  Patient also shared that she forgot to request a refill of her Albuterol inhaler when she was in the office recently.  Collaborate with Dr. Evette Doffing and he sent a refill in for her. Knowledge Deficits related to plan of care for management of HTN, COPD, and DMII  Chronic Disease Management support and education needs related to HTN, COPD, and DMII  RNCM  Clinical Goal(s):  Patient will verbalize understanding of plan for management of HTN, COPD, and DMII as evidenced by A1C below 7. take all medications exactly as prescribed and will call provider for medication related questions as evidenced by discussions with provider and RNCM. demonstrate understanding of rationale for each prescribed medication as evidenced by bringing medications to every appointment. continue to work with RN Care Manager to address care management and care coordination needs related to  HTN, COPD, and DMII as evidenced by adherence to CM Team Scheduled appointments not experience hospital admission as evidenced by review of EMR. Hospital Admissions in last 6 months = 0  through collaboration with RN Care manager, provider, and care team.  Interventions: 1:1 collaboration with primary care provider regarding development and update of comprehensive plan of care as evidenced by provider attestation and co-signature Inter-disciplinary care team collaboration (see longitudinal plan of care) Evaluation of current treatment plan related to  self management and patient's adherence to plan as established by provider COPD Interventions:  (Status:  Goal on track:  Yes.) Long Term Goal Advised patient to engage in light exercise as tolerated 3-5 days a week to aid in the the management of COPD Provided education about and advised patient to utilize infection prevention strategies to reduce risk of respiratory infection Discussed the importance of adequate rest and management of fatigue with COPD Diabetes Interventions:  (Status:  Goal on track:  Yes.) Long Term Goal Assessed patient's understanding of A1c goal: <7% Provided education to patient about basic DM disease process Reviewed medications with patient and discussed importance of medication adherence Discussed plans with patient for ongoing care management follow up and provided patient with direct contact information for care  management team Review of patient status, including review of consultants reports, relevant laboratory and other test results, and medications completed Lab Results  Component Value Date   HGBA1C 6.5 (A) 03/24/2021  Hypertension Interventions:  (Status:  Goal on track:  Yes.) Long Term Goal Last practice recorded BP readings:  BP Readings from Last 3 Encounters:  03/24/21 118/61  12/16/20 137/69  08/19/20 (!) 113/56  Most recent eGFR/CrCl:  Lab Results  Component Value Date   EGFR 38 (L) 03/24/2021    No components found for: CRCL  Evaluation of current treatment plan related to hypertension self management and patient's adherence to plan as established by provider Discussed plans with patient for ongoing care management follow up and provided patient with direct contact information for care management team Advised patient, providing education and rationale, to monitor blood pressure daily and record, calling PCP for findings outside established parameters Discussed complications of poorly controlled blood pressure such as heart disease, stroke, circulatory complications, vision complications, kidney impairment, sexual dysfunction  Patient Goals/Self-Care Activities: Take all medications as prescribed Attend all scheduled provider appointments Call pharmacy for medication refills 3-7 days in advance of running out of medications Call provider office for new concerns or questions  take the blood sugar log to all doctor visits keep feet up while sitting wash and dry feet carefully every day wear comfortable, cotton socks identify and avoid work-related triggers begin a symptom diary follow rescue plan if symptoms flare-up keep follow-up appointments: 03/27/21@10 :15 check blood pressure weekly keep a blood pressure log call doctor for signs and symptoms of high blood pressure  Follow Up Plan:  The patient has been provided with contact information for the care management team and  has been advised to call with any health related questions or concerns.      [name], RN, BSN, CCM Care Management Coordinator [facility] Phone: [phone]: [phone]

## 2021-05-05 ENCOUNTER — Ambulatory Visit (INDEPENDENT_AMBULATORY_CARE_PROVIDER_SITE_OTHER): Payer: Medicare Other

## 2021-05-05 ENCOUNTER — Ambulatory Visit
Admission: EM | Admit: 2021-05-05 | Discharge: 2021-05-05 | Disposition: A | Discharge status: Home / Self Care | Payer: Medicare Other | Attending: Internal Medicine | Admitting: Internal Medicine

## 2021-05-05 DIAGNOSIS — M79671 Pain in right foot: Secondary | ICD-10-CM | POA: Diagnosis not present

## 2021-05-05 DIAGNOSIS — M25571 Pain in right ankle and joints of right foot: Secondary | ICD-10-CM

## 2021-05-05 DIAGNOSIS — M85871 Other specified disorders of bone density and structure, right ankle and foot: Secondary | ICD-10-CM | POA: Diagnosis not present

## 2021-05-05 DIAGNOSIS — M19071 Primary osteoarthritis, right ankle and foot: Secondary | ICD-10-CM | POA: Diagnosis not present

## 2021-05-05 MED ORDER — PREDNISONE 10 MG PO TABS: 10.0000 mg | ORAL_TABLET | Freq: Every day | ORAL | 0 refills | Status: AC

## 2021-05-05 NOTE — Discharge Instructions (Addendum)
It appears that may have a form of arthritis that is causing your pain.  You have been prescribed prednisone to help decrease inflammation.  Please follow-up with primary care doctor for further evaluation and management. ?

## 2021-05-05 NOTE — ED Triage Notes (Signed)
Patient presents to Urgent Care with complaints of right foot/leg pain since Saturday. She states pain starts at her heel, ankle area that radiates to shin area. Treating pain with Advil, tylenol, and aleve no relief.  ? ?Denies injury.  ?
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
## 2021-05-26 ENCOUNTER — Ambulatory Visit: Payer: Medicare Other

## 2021-05-26 NOTE — Patient Instructions (Signed)
?  If you are experiencing a Mental Health or [place] or need someone to talk to, please call the [place] National Suicide Prevention Lifeline: 360-169-3382 or TTY: [phone] TTY [phone]) to talk to a trained counselor  ? ?The patient verbalized understanding of instructions, educational materials, and care plan provided today and declined offer to receive copy of patient instructions, educational materials, and care plan.  ? ?Follow up with provider re: Continued Right ankle/foot pain ? ?[name], RN, BSN, CCM ?Care Management Coordinator ?[facility] ?Phone: [phone]/YPP: [phone]  ?
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
## 2021-05-29 ENCOUNTER — Telehealth: Payer: Self-pay | Admitting: Student in an Organized Health Care Education/Training Program

## 2021-05-29 NOTE — Telephone Encounter (Signed)
Please refer to message below.  Unable to contact patient via telephone this morning to schedule an appointment.  Left detailed message to call our office to schedule an appointment at her earliest convenience. ?

## 2021-05-29 NOTE — Telephone Encounter (Signed)
-----   Message from [name], RN sent at 05/26/2021  2:58 PM EDT ----- ?Good afternoon, ?Can you please call this patient and schedule appointment to be seen? She was seen in the ED on 05/05/21 for right ankle pain and it is not getting better.  She is having difficulty walking. ?Thank you! ?Deb ? ?[name], RN, BSN, CCM ?Care Management Coordinator ?[facility] ?Phone: [phone]/YOV: [phone]  ? ?

## 2021-05-30 ENCOUNTER — Encounter (HOSPITAL_COMMUNITY): Payer: Self-pay

## 2021-05-30 ENCOUNTER — Observation Stay (HOSPITAL_COMMUNITY)
Admission: EM | Admit: 2021-05-30 | Discharge: 2021-05-31 | Disposition: A | Discharge status: Home / Self Care | Payer: Medicare Other | Attending: Internal Medicine | Admitting: Internal Medicine

## 2021-05-30 ENCOUNTER — Other Ambulatory Visit: Payer: Self-pay

## 2021-05-30 ENCOUNTER — Ambulatory Visit
Admission: EM | Admit: 2021-05-30 | Discharge: 2021-05-30 | Disposition: A | Discharge status: Home / Self Care | Payer: Medicare Other

## 2021-05-30 ENCOUNTER — Emergency Department (HOSPITAL_COMMUNITY): Payer: Medicare Other

## 2021-05-30 DIAGNOSIS — I2510 Atherosclerotic heart disease of native coronary artery without angina pectoris: Secondary | ICD-10-CM | POA: Diagnosis not present

## 2021-05-30 DIAGNOSIS — U071 COVID-19: Secondary | ICD-10-CM

## 2021-05-30 DIAGNOSIS — I110 Hypertensive heart disease with heart failure: Secondary | ICD-10-CM | POA: Diagnosis not present

## 2021-05-30 DIAGNOSIS — R5381 Other malaise: Secondary | ICD-10-CM | POA: Diagnosis not present

## 2021-05-30 DIAGNOSIS — Z8611 Personal history of tuberculosis: Secondary | ICD-10-CM

## 2021-05-30 DIAGNOSIS — R509 Fever, unspecified: Secondary | ICD-10-CM | POA: Diagnosis not present

## 2021-05-30 DIAGNOSIS — N1832 Chronic kidney disease, stage 3b: Secondary | ICD-10-CM | POA: Insufficient documentation

## 2021-05-30 DIAGNOSIS — I509 Heart failure, unspecified: Secondary | ICD-10-CM | POA: Diagnosis not present

## 2021-05-30 DIAGNOSIS — I252 Old myocardial infarction: Secondary | ICD-10-CM | POA: Insufficient documentation

## 2021-05-30 DIAGNOSIS — Z79899 Other long term (current) drug therapy: Secondary | ICD-10-CM | POA: Insufficient documentation

## 2021-05-30 DIAGNOSIS — I5032 Chronic diastolic (congestive) heart failure: Secondary | ICD-10-CM | POA: Insufficient documentation

## 2021-05-30 DIAGNOSIS — J441 Chronic obstructive pulmonary disease with (acute) exacerbation: Secondary | ICD-10-CM | POA: Diagnosis not present

## 2021-05-30 DIAGNOSIS — E1122 Type 2 diabetes mellitus with diabetic chronic kidney disease: Secondary | ICD-10-CM | POA: Diagnosis not present

## 2021-05-30 DIAGNOSIS — I130 Hypertensive heart and chronic kidney disease with heart failure and stage 1 through stage 4 chronic kidney disease, or unspecified chronic kidney disease: Secondary | ICD-10-CM | POA: Diagnosis not present

## 2021-05-30 DIAGNOSIS — Z7984 Long term (current) use of oral hypoglycemic drugs: Secondary | ICD-10-CM | POA: Diagnosis not present

## 2021-05-30 DIAGNOSIS — R059 Cough, unspecified: Secondary | ICD-10-CM | POA: Diagnosis not present

## 2021-05-30 DIAGNOSIS — J449 Chronic obstructive pulmonary disease, unspecified: Secondary | ICD-10-CM | POA: Insufficient documentation

## 2021-05-30 DIAGNOSIS — J9620 Acute and chronic respiratory failure, unspecified whether with hypoxia or hypercapnia: Secondary | ICD-10-CM | POA: Insufficient documentation

## 2021-05-30 DIAGNOSIS — J849 Interstitial pulmonary disease, unspecified: Secondary | ICD-10-CM

## 2021-05-30 DIAGNOSIS — R6883 Chills (without fever): Secondary | ICD-10-CM | POA: Diagnosis not present

## 2021-05-30 DIAGNOSIS — R0602 Shortness of breath: Secondary | ICD-10-CM

## 2021-05-30 DIAGNOSIS — R0902 Hypoxemia: Secondary | ICD-10-CM

## 2021-05-30 DIAGNOSIS — J9601 Acute respiratory failure with hypoxia: Secondary | ICD-10-CM

## 2021-05-30 DIAGNOSIS — E039 Hypothyroidism, unspecified: Secondary | ICD-10-CM | POA: Insufficient documentation

## 2021-05-30 DIAGNOSIS — R079 Chest pain, unspecified: Secondary | ICD-10-CM | POA: Diagnosis not present

## 2021-05-30 HISTORY — DX: COVID-19: U07.1

## 2021-05-30 LAB — CBC WITH DIFFERENTIAL/PLATELET
Abs Immature Granulocytes: 0.03 10*3/uL (ref 0.00–0.07)
Basophils Absolute: 0 10*3/uL (ref 0.0–0.1)
Basophils Relative: 0 %
Eosinophils Absolute: 0.2 10*3/uL (ref 0.0–0.5)
Eosinophils Relative: 3 %
HCT: 28.3 % — ABNORMAL LOW (ref 36.0–46.0)
Hemoglobin: 9.2 g/dL — ABNORMAL LOW (ref 12.0–15.0)
Immature Granulocytes: 0 %
Lymphocytes Relative: 14 %
Lymphs Abs: 1.1 10*3/uL (ref 0.7–4.0)
MCH: 28.5 pg (ref 26.0–34.0)
MCHC: 32.5 g/dL (ref 30.0–36.0)
MCV: 87.6 fL (ref 80.0–100.0)
Monocytes Absolute: 1.2 10*3/uL — ABNORMAL HIGH (ref 0.1–1.0)
Monocytes Relative: 15 %
Neutro Abs: 5.4 10*3/uL (ref 1.7–7.7)
Neutrophils Relative %: 68 %
Platelets: 175 10*3/uL (ref 150–400)
RBC: 3.23 MIL/uL — ABNORMAL LOW (ref 3.87–5.11)
RDW: 16.4 % — ABNORMAL HIGH (ref 11.5–15.5)
WBC: 8 10*3/uL (ref 4.0–10.5)
nRBC: 0 % (ref 0.0–0.2)

## 2021-05-30 LAB — COMPREHENSIVE METABOLIC PANEL
ALT: 11 U/L (ref 0–44)
AST: 18 U/L (ref 15–41)
Albumin: 3.3 g/dL — ABNORMAL LOW (ref 3.5–5.0)
Alkaline Phosphatase: 92 U/L (ref 38–126)
Anion gap: 7 (ref 5–15)
BUN: 17 mg/dL (ref 8–23)
CO2: 22 mmol/L (ref 22–32)
Calcium: 9.6 mg/dL (ref 8.9–10.3)
Chloride: 107 mmol/L (ref 98–111)
Creatinine, Ser: 1.24 mg/dL — ABNORMAL HIGH (ref 0.44–1.00)
GFR, Estimated: 43 mL/min — ABNORMAL LOW (ref >60)
Glucose, Bld: 112 mg/dL — ABNORMAL HIGH (ref 70–99)
Potassium: 4.1 mmol/L (ref 3.5–5.1)
Sodium: 136 mmol/L (ref 135–145)
Total Bilirubin: 0.6 mg/dL (ref 0.3–1.2)
Total Protein: 6.7 g/dL (ref 6.5–8.1)

## 2021-05-30 LAB — URINALYSIS, ROUTINE W REFLEX MICROSCOPIC
Bilirubin Urine: NEGATIVE
Glucose, UA: NEGATIVE mg/dL
Hgb urine dipstick: NEGATIVE
Ketones, ur: NEGATIVE mg/dL
Leukocytes,Ua: NEGATIVE
Nitrite: NEGATIVE
Protein, ur: NEGATIVE mg/dL
Specific Gravity, Urine: 1.011 (ref 1.005–1.030)
pH: 5 (ref 5.0–8.0)

## 2021-05-30 LAB — TROPONIN I (HIGH SENSITIVITY)
Troponin I (High Sensitivity): 5 ng/L (ref <18)
Troponin I (High Sensitivity): 5 ng/L (ref <18)

## 2021-05-30 LAB — LACTIC ACID, PLASMA: Lactic Acid, Venous: 1.3 mmol/L (ref 0.5–1.9)

## 2021-05-30 LAB — LIPASE, BLOOD: Lipase: 27 U/L (ref 11–51)

## 2021-05-30 LAB — BRAIN NATRIURETIC PEPTIDE: B Natriuretic Peptide: 35.6 pg/mL (ref 0.0–100.0)

## 2021-05-30 LAB — RESP PANEL BY RT-PCR (FLU A&B, COVID) ARPGX2
Influenza A by PCR: NEGATIVE
Influenza B by PCR: NEGATIVE
SARS Coronavirus 2 by RT PCR: POSITIVE — AB

## 2021-05-30 MED ORDER — ENOXAPARIN SODIUM 40 MG/0.4ML IJ SOSY
40.0000 mg | PREFILLED_SYRINGE | INTRAMUSCULAR | Status: DC
  Administered 2021-05-31: 40 mg via SUBCUTANEOUS
  Filled 2021-05-30: qty 0.4

## 2021-05-30 MED ORDER — SENNOSIDES-DOCUSATE SODIUM 8.6-50 MG PO TABS: 1.0000 | ORAL_TABLET | Freq: Every evening | ORAL | Status: DC | PRN

## 2021-05-30 MED ORDER — ACETAMINOPHEN 325 MG PO TABS: 650.0000 mg | ORAL_TABLET | Freq: Four times a day (QID) | ORAL | Status: DC | PRN

## 2021-05-30 MED ORDER — ONDANSETRON HCL 4 MG/2ML IJ SOLN
4.0000 mg | Freq: Four times a day (QID) | INTRAMUSCULAR | Status: DC | PRN
Start: 2021-05-30 — End: 2021-05-31

## 2021-05-30 MED ORDER — ACETAMINOPHEN 650 MG RE SUPP: 650.0000 mg | Freq: Four times a day (QID) | RECTAL | Status: DC | PRN

## 2021-05-30 MED ORDER — GUAIFENESIN ER 600 MG PO TB12
600.0000 mg | ORAL_TABLET | Freq: Two times a day (BID) | ORAL | Status: DC
  Administered 2021-05-31: 600 mg via ORAL
  Filled 2021-05-30: qty 1

## 2021-05-30 MED ORDER — ONDANSETRON HCL 4 MG PO TABS: 4.0000 mg | ORAL_TABLET | Freq: Four times a day (QID) | ORAL | Status: DC | PRN

## 2021-05-30 NOTE — ED Provider Notes (Signed)
?[facility] ?Provider Note ? ? ?CSN: [id number] ?Arrival date & time: 05/30/21  1134 ? ?  ? ?History ? ?Chief Complaint  ?Patient presents with  ? Headache  ? Cough  ? ? ?Caitlyn Aguirre is a 86 y.o. female. ? ?The history is provided by the patient, a relative and medical records. No language interpreter was used.  ?Cough ?Cough characteristics:  Productive ?Sputum characteristics:  Nondescript ?Severity:  Moderate ?Onset quality:  Gradual ?Duration:  3 days ?Timing:  Constant ?Progression:  Worsening ?Chronicity:  New ?Relieved by:  Nothing ?Worsened by:  Nothing ?Ineffective treatments:  None tried ?Associated symptoms: chest pain, chills, fever (subjecgtive), myalgias, rhinorrhea, shortness of breath and sinus congestion   ?Associated symptoms: no diaphoresis, no headaches, no rash, no weight loss and no wheezing   ? ?  ? ?Home Medications ?Prior to Admission medications   ?Medication Sig Start Date End Date Taking? Authorizing Provider  ?Accu-Chek Softclix Lancets lancets Check 1 time a day as instructed 06/28/19   Axel Filler, MD  ?acetaminophen (TYLENOL) 325 MG tablet Take 2 tablets (650 mg total) by mouth every 6 (six) hours as needed for mild pain (or Fever >/= 101). 07/24/19   Nita Sells, MD  ?albuterol (VENTOLIN HFA) 108 (90 Base) MCG/ACT inhaler Inhale 1-2 puffs into the lungs every 6 (six) hours as needed for wheezing or shortness of breath. 03/31/21   Axel Filler, MD  ?amLODipine (NORVASC) 5 MG tablet Take 1 tablet (5 mg total) by mouth daily. 08/07/20   Axel Filler, MD  ?atorvastatin (LIPITOR) 40 MG tablet Take 1 tablet (40 mg total) by mouth daily. 08/07/20   Axel Filler, MD  ?benzonatate (TESSALON PERLES) 100 MG capsule Take 1 capsule (100 mg total) by mouth 3 (three) times daily as needed for cough. 03/24/21 03/24/22  Axel Filler, MD  ?Blood Glucose Monitoring Suppl (ACCU-CHEK GUIDE ME) w/Device KIT Check 1  times a day as instructed 06/28/19   Axel Filler, MD  ?carvedilol (COREG) 25 MG tablet Take 1 tablet (25 mg total) by mouth 2 (two) times daily with a meal. 08/07/20   Axel Filler, MD  ?chlorthalidone (HYGROTON) 25 MG tablet Take 1 tablet (25 mg total) by mouth daily. 12/17/20   Axel Filler, MD  ?diclofenac Sodium (VOLTAREN) 1 % GEL Apply 2 g topically 4 (four) times daily. 08/19/20   Axel Filler, MD  ?glucose blood (ACCU-CHEK GUIDE) test strip Check blood sugar 1 time per day 06/28/19   Axel Filler, MD  ?latanoprost (XALATAN) 0.005 % ophthalmic solution Place 1 drop into both eyes at bedtime.    [provider]  ?levothyroxine (SYNTHROID) 75 MCG tablet Take 1 tablet (75 mcg total) by mouth daily. 08/07/20   Axel Filler, MD  ?loratadine (CLARITIN) 10 MG tablet Take 1 tablet (10 mg total) by mouth daily. 02/05/20   Wieters, Hallie C, PA-C  ?metFORMIN (GLUCOPHAGE) 1000 MG tablet Take 1 tablet (1,000 mg total) by mouth daily with breakfast. 12/16/20   Axel Filler, MD  ?omeprazole (PRILOSEC) 20 MG capsule Take 1 capsule (20 mg total) by mouth daily. 08/07/20   Axel Filler, MD  ?OXYGEN Inhale 2 L into the lungs daily as needed (for breathing).     [provider]  ?PARoxetine (PAXIL) 30 MG tablet Take 1 tablet (30 mg total) by mouth daily. 08/07/20   Axel Filler, MD  ?ramelteon (ROZEREM) 8 MG tablet  Take 1 tablet (8 mg total) by mouth at bedtime. 05/22/19   Axel Filler, MD  ?SPIRIVA HANDIHALER 18 MCG inhalation capsule INHALE THE CONTENTS OF 1  CAPSULE BY MOUTH VIA  HANDIHALER DAILY ?Patient taking differently: Place 18 mcg into inhaler and inhale daily as needed (for wheezing or shortness of breath.). 01/02/19   Axel Filler, MD  ?   ? ?Allergies    ?Patient has no known allergies.   ? ?Review of Systems   ?Review of Systems  ?Constitutional:  Positive for chills, fatigue and fever (subjecgtive).  Negative for diaphoresis and weight loss.  ?HENT:  Positive for congestion and rhinorrhea.   ?Eyes:  Negative for visual disturbance.  ?Respiratory:  Positive for cough, chest tightness and shortness of breath. Negative for wheezing.   ?Cardiovascular:  Positive for chest pain and leg swelling. Negative for palpitations.  ?Gastrointestinal:  Negative for abdominal pain, constipation, diarrhea, nausea and vomiting.  ?Genitourinary:  Negative for dysuria, flank pain and frequency.  ?Musculoskeletal:  Positive for myalgias. Negative for back pain, neck pain and neck stiffness.  ?Skin:  Negative for rash and wound.  ?Neurological:  Negative for light-headedness and headaches.  ?Psychiatric/Behavioral:  Negative for agitation and confusion.   ?All other systems reviewed and are negative. ? ?Physical Exam ?Updated Vital Signs ?BP 128/70 (BP Location: Right Arm)   Pulse 79   Temp 99 ?F (37.2 ?C) (Oral)   Resp 20   Ht 5' 8"  (1.727 m)   Wt 88.5 kg   SpO2 95%   BMI 29.65 kg/m?  ?Physical Exam ?Vitals and nursing note reviewed.  ?Constitutional:   ?   General: She is not in acute distress. ?   Appearance: She is well-developed. She is not ill-appearing, toxic-appearing or diaphoretic.  ?HENT:  ?   Head: Normocephalic and atraumatic.  ?   Nose: Congestion present.  ?   Mouth/Throat:  ?   Mouth: Mucous membranes are moist.  ?Eyes:  ?   Extraocular Movements: Extraocular movements intact.  ?   Right eye: Normal extraocular motion.  ?   Left eye: Normal extraocular motion.  ?   Conjunctiva/sclera: Conjunctivae normal.  ?   Pupils: Pupils are equal, round, and reactive to light.  ?Cardiovascular:  ?   Rate and Rhythm: Normal rate and regular rhythm.  ?   Heart sounds: No murmur heard. ?Pulmonary:  ?   Effort: Pulmonary effort is normal. No respiratory distress.  ?   Breath sounds: Rhonchi and rales present. No wheezing.  ?Chest:  ?   Chest wall: Tenderness present.  ?Abdominal:  ?   General: Abdomen is flat.  ?   Palpations:  Abdomen is soft.  ?   Tenderness: There is no abdominal tenderness. There is no right CVA tenderness, left CVA tenderness, guarding or rebound.  ?Musculoskeletal:     ?   General: No swelling or tenderness.  ?   Cervical back: Neck supple. No tenderness.  ?   Right lower leg: Edema present.  ?   Left lower leg: Edema present.  ?Skin: ?   General: Skin is warm and dry.  ?   Capillary Refill: Capillary refill takes less than 2 seconds.  ?   Findings: No erythema or rash.  ?Neurological:  ?   General: No focal deficit present.  ?   Mental Status: She is alert. Mental status is at baseline.  ?Psychiatric:     ?   Mood and Affect: Mood normal. Mood is  not anxious.  ? ? ?ED Results / Procedures / Treatments   ?Labs ?(all labs ordered are listed, but only abnormal results are displayed) ?Labs Reviewed  ?RESP PANEL BY RT-PCR (FLU A&B, COVID) ARPGX2 - Abnormal; Notable for the following components:  ?    Result Value  ? SARS Coronavirus 2 by RT PCR POSITIVE (*)   ? All other components within normal limits  ?CBC WITH DIFFERENTIAL/PLATELET - Abnormal; Notable for the following components:  ? RBC 3.23 (*)   ? Hemoglobin 9.2 (*)   ? HCT 28.3 (*)   ? RDW 16.4 (*)   ? Monocytes Absolute 1.2 (*)   ? All other components within normal limits  ?COMPREHENSIVE METABOLIC PANEL - Abnormal; Notable for the following components:  ? Glucose, Bld 112 (*)   ? Creatinine, Ser 1.24 (*)   ? Albumin 3.3 (*)   ? GFR, Estimated 43 (*)   ? All other components within normal limits  ?URINE CULTURE  ?CULTURE, BLOOD (ROUTINE X 2)  ?CULTURE, BLOOD (ROUTINE X 2)  ?LACTIC ACID, PLASMA  ?LIPASE, BLOOD  ?BRAIN NATRIURETIC PEPTIDE  ?LACTIC ACID, PLASMA  ?URINALYSIS, ROUTINE W REFLEX MICROSCOPIC  ?TROPONIN I (HIGH SENSITIVITY)  ?TROPONIN I (HIGH SENSITIVITY)  ? ? ?EKG ?EKG Interpretation ? ?Date/Time:  Friday May 30 2021 16:21:49 EDT ?Ventricular Rate:  77 ?PR Interval:  169 ?QRS Duration: 86 ?QT Interval:  376 ?QTC Calculation: 426 ?R Axis:   18 ?Text  Interpretation: Sinus rhythm Low voltage, precordial leads when compared to prior, similar appearance. NO STEMI Confirmed by Antony Blackbird [phone]) on 05/30/2021 4:28:59 PM ? ?Radiology ?DG Chest Portable 1 Vie

## 2021-05-30 NOTE — ED Notes (Signed)
MD stated ok for patient to eat/drink ?

## 2021-05-30 NOTE — ED Notes (Signed)
Phlebotomist called at this time and reports awareness of the need for the patient's lab draw ?

## 2021-05-30 NOTE — Discharge Instructions (Addendum)
Please report to the hospital now as I am concerned you are having a COPD exacerbation with possible pneumonia given your low oxygen levels of 84-86% and fever in our clinic. Please do not go anywhere else, head straight to the hospital.  ?

## 2021-05-30 NOTE — ED Notes (Signed)
Charge nurse made aware of the patient's need for an USGPIV or peripheral IV access  ?

## 2021-05-30 NOTE — ED Notes (Signed)
Another RN in the orange pod has attempted to obtain an IV twice. IV team consulted via epic. Provider to be made aware ?

## 2021-05-30 NOTE — ED Notes (Signed)
This RN attempted to stick the patient for a blood draw and iv twice without success. Will call another RN or IV team to accomplish said plan of care ?
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
## 2021-05-30 NOTE — ED Notes (Addendum)
Patient made aware of the need for a urine sample at this time ?

## 2021-05-30 NOTE — ED Notes (Signed)
This tech attempted to draw labs on pt, was unsuccessful in attempt. RN notified and IV team called.  ?

## 2021-05-30 NOTE — ED Triage Notes (Signed)
Pt arrived POV from urgent care c/o headache, feeling cold, a cough and not feeling well since yesterday. Pt states she was not able to rest last night. Pt states she felt like her oxygen was low but did not check it.  ?

## 2021-05-30 NOTE — Hospital Course (Addendum)
Daughter was present with the pt in the room. ? ?Pt states that she has "felt bad all week." Pt states that she was feeling bad yesterday, body aches, HA, no energy. Did not sleep well last night. Also with a cough productive of white sputum. Has been wheezing, uses her COPD inhalers at home. Daughter brought her to UC, but then they sent her over here because her oxygen levels were low even though she slept with her O2 for most of the night. Uses oxygen intermittently at home and puts in on while she is lying down (2L). Does not use a CPAP at home. She does not have a pulse ox to check her O2 levels at home. Sleeps with 2-3 pillows at night. ? ?Had COVID in January 2021 and January 2022. She was not hospitalized for her prior COVID infections. Has not received any of the COVID vaccines.  ? ?Denies loss of taste or smell. Denies n/v, diarrhea, constipation. Feels weak all over, fatigued.  ? ?Denies any sick contacts.  ? ?Also complains of right ankle swelling, went to the doctor who gave her some kind of medication, but when she ran out, the pain came right back.  ? ?Fhx:  ?Family History  ?Problem Relation Age of Onset  ? Diabetes Mother   ? Heart attack Father   ? Diabetes Sister   ? Diabetes Daughter   ? Anesthesia problems Neg Hx   ? Malignant hyperthermia Neg Hx   ? Breast cancer Neg Hx   ? ?Shx: Lives with her daughter and her daughter's son. Used to smoke tobacco, about 1 ppd until 1976 (started when she was about 86 yo). No current alcohol or other drug use.  ? ? ? ? ? ? ? ? ? ?

## 2021-05-30 NOTE — ED Triage Notes (Signed)
Pt c/o body chills, aches, headache, SOB, cough (normal from copd), chest ache, runny nose, fatigue, malaise  ? ?Denies sore throat, nausea, vomiting, diarrhea, constipation  ? ?Onset ~ Monday but worsened yesterday  ? ?Hx of COPD uses o2 at home. Does not check spo2 at home. Also c/o right ankle pain was seen at this UC earlier this month and given steroids which helped but has not resolved since discontinuing.  ?
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
## 2021-05-31 ENCOUNTER — Other Ambulatory Visit (HOSPITAL_COMMUNITY): Payer: Self-pay

## 2021-05-31 ENCOUNTER — Observation Stay (HOSPITAL_COMMUNITY): Payer: Medicare Other

## 2021-05-31 ENCOUNTER — Observation Stay (HOSPITAL_BASED_OUTPATIENT_CLINIC_OR_DEPARTMENT_OTHER): Payer: Medicare Other

## 2021-05-31 DIAGNOSIS — R0609 Other forms of dyspnea: Secondary | ICD-10-CM | POA: Diagnosis not present

## 2021-05-31 DIAGNOSIS — U071 COVID-19: Secondary | ICD-10-CM

## 2021-05-31 DIAGNOSIS — J439 Emphysema, unspecified: Secondary | ICD-10-CM | POA: Diagnosis not present

## 2021-05-31 DIAGNOSIS — J479 Bronchiectasis, uncomplicated: Secondary | ICD-10-CM | POA: Diagnosis not present

## 2021-05-31 DIAGNOSIS — R0600 Dyspnea, unspecified: Secondary | ICD-10-CM | POA: Diagnosis not present

## 2021-05-31 DIAGNOSIS — J849 Interstitial pulmonary disease, unspecified: Secondary | ICD-10-CM | POA: Diagnosis not present

## 2021-05-31 LAB — ECHOCARDIOGRAM COMPLETE
AR max vel: 2.23 cm2
AV Area VTI: 2.22 cm2
AV Area mean vel: 2.15 cm2
AV Mean grad: 4 mmHg
AV Peak grad: 6.5 mmHg
Ao pk vel: 1.27 m/s
Area-P 1/2: 2.14 cm2
Calc EF: 58.2 %
Height: 68 in
MV VTI: 2.61 cm2
S' Lateral: 3.2 cm
Single Plane A2C EF: 55.1 %
Single Plane A4C EF: 61.7 %
Weight: 3044.11 oz

## 2021-05-31 LAB — LIPID PANEL
Cholesterol: 145 mg/dL (ref 0–200)
HDL: 45 mg/dL (ref >40)
LDL Cholesterol: 86 mg/dL (ref 0–99)
Total CHOL/HDL Ratio: 3.2 RATIO
Triglycerides: 68 mg/dL (ref <150)
VLDL: 14 mg/dL (ref 0–40)

## 2021-05-31 LAB — CBC
HCT: 28.7 % — ABNORMAL LOW (ref 36.0–46.0)
Hemoglobin: 9.3 g/dL — ABNORMAL LOW (ref 12.0–15.0)
MCH: 27.4 pg (ref 26.0–34.0)
MCHC: 32.4 g/dL (ref 30.0–36.0)
MCV: 84.7 fL (ref 80.0–100.0)
Platelets: 180 10*3/uL (ref 150–400)
RBC: 3.39 MIL/uL — ABNORMAL LOW (ref 3.87–5.11)
RDW: 16.3 % — ABNORMAL HIGH (ref 11.5–15.5)
WBC: 6 10*3/uL (ref 4.0–10.5)
nRBC: 0 % (ref 0.0–0.2)

## 2021-05-31 LAB — C-REACTIVE PROTEIN: CRP: 4.2 mg/dL — ABNORMAL HIGH (ref <1.0)

## 2021-05-31 LAB — BASIC METABOLIC PANEL
Anion gap: 8 (ref 5–15)
BUN: 17 mg/dL (ref 8–23)
CO2: 21 mmol/L — ABNORMAL LOW (ref 22–32)
Calcium: 9.8 mg/dL (ref 8.9–10.3)
Chloride: 108 mmol/L (ref 98–111)
Creatinine, Ser: 1.18 mg/dL — ABNORMAL HIGH (ref 0.44–1.00)
GFR, Estimated: 46 mL/min — ABNORMAL LOW (ref >60)
Glucose, Bld: 114 mg/dL — ABNORMAL HIGH (ref 70–99)
Potassium: 4.2 mmol/L (ref 3.5–5.1)
Sodium: 137 mmol/L (ref 135–145)

## 2021-05-31 LAB — D-DIMER, QUANTITATIVE: D-Dimer, Quant: 0.77 ug/mL-FEU — ABNORMAL HIGH (ref 0.00–0.50)

## 2021-05-31 LAB — LACTIC ACID, PLASMA: Lactic Acid, Venous: 1 mmol/L (ref 0.5–1.9)

## 2021-05-31 LAB — LACTATE DEHYDROGENASE: LDH: 182 U/L (ref 98–192)

## 2021-05-31 LAB — GLUCOSE, CAPILLARY
Glucose-Capillary: 124 mg/dL — ABNORMAL HIGH (ref 70–99)
Glucose-Capillary: 163 mg/dL — ABNORMAL HIGH (ref 70–99)

## 2021-05-31 LAB — FERRITIN: Ferritin: 14 ng/mL (ref 11–307)

## 2021-05-31 MED ORDER — GUAIFENESIN ER 600 MG PO TB12: 600.0000 mg | ORAL_TABLET | Freq: Two times a day (BID) | ORAL | 0 refills | Status: DC

## 2021-05-31 MED ORDER — INSULIN ASPART 100 UNIT/ML IJ SOLN: 0.0000 [IU] | Freq: Three times a day (TID) | INTRAMUSCULAR | Status: DC

## 2021-05-31 MED ORDER — METFORMIN HCL 500 MG PO TABS
1000.0000 mg | ORAL_TABLET | Freq: Every day | ORAL | Status: DC
  Administered 2021-05-31: 1000 mg via ORAL
  Filled 2021-05-31: qty 2

## 2021-05-31 MED ORDER — CARVEDILOL 25 MG PO TABS
25.0000 mg | ORAL_TABLET | Freq: Two times a day (BID) | ORAL | Status: DC
  Administered 2021-05-31: 25 mg via ORAL
  Filled 2021-05-31: qty 1

## 2021-05-31 MED ORDER — DEXAMETHASONE 6 MG PO TABS
6.0000 mg | ORAL_TABLET | Freq: Every day | ORAL | Status: DC
  Administered 2021-05-31: 6 mg via ORAL
  Filled 2021-05-31: qty 1

## 2021-05-31 MED ORDER — LATANOPROST 0.005 % OP SOLN: 1.0000 [drp] | Freq: Every day | OPHTHALMIC | Status: DC

## 2021-05-31 MED ORDER — LEVOTHYROXINE SODIUM 75 MCG PO TABS
75.0000 ug | ORAL_TABLET | Freq: Every day | ORAL | Status: DC
  Administered 2021-05-31: 75 ug via ORAL
  Filled 2021-05-31: qty 1

## 2021-05-31 MED ORDER — UMECLIDINIUM BROMIDE 62.5 MCG/ACT IN AEPB
1.0000 | INHALATION_SPRAY | Freq: Every day | RESPIRATORY_TRACT | Status: DC
  Filled 2021-05-31: qty 7

## 2021-05-31 MED ORDER — PREDNISONE 20 MG PO TABS
40.0000 mg | ORAL_TABLET | Freq: Every day | ORAL | 0 refills | Status: AC
Start: 2021-05-31 — End: 2021-06-14

## 2021-05-31 MED ORDER — PAROXETINE HCL 30 MG PO TABS
30.0000 mg | ORAL_TABLET | Freq: Every day | ORAL | Status: DC
  Administered 2021-05-31: 30 mg via ORAL
  Filled 2021-05-31: qty 1

## 2021-05-31 MED ORDER — ATORVASTATIN CALCIUM 40 MG PO TABS
40.0000 mg | ORAL_TABLET | Freq: Every day | ORAL | Status: DC
  Administered 2021-05-31: 40 mg via ORAL
  Filled 2021-05-31: qty 1

## 2021-05-31 MED ORDER — ORAL CARE MOUTH RINSE: 15.0000 mL | Freq: Two times a day (BID) | OROMUCOSAL | Status: DC

## 2021-05-31 MED ORDER — PANTOPRAZOLE SODIUM 40 MG PO TBEC
40.0000 mg | DELAYED_RELEASE_TABLET | Freq: Every day | ORAL | Status: DC
  Administered 2021-05-31: 40 mg via ORAL
  Filled 2021-05-31: qty 1

## 2021-05-31 MED ORDER — TIOTROPIUM BROMIDE MONOHYDRATE 18 MCG IN CAPS
18.0000 ug | ORAL_CAPSULE | Freq: Every day | RESPIRATORY_TRACT | Status: DC
  Filled 2021-05-31: qty 5

## 2021-05-31 MED ORDER — GUAIFENESIN ER 600 MG PO TB12: 600.0000 mg | ORAL_TABLET | Freq: Two times a day (BID) | ORAL | 0 refills | Status: AC

## 2021-05-31 MED ORDER — ALBUTEROL SULFATE (2.5 MG/3ML) 0.083% IN NEBU: 2.5000 mg | INHALATION_SOLUTION | RESPIRATORY_TRACT | Status: DC | PRN

## 2021-05-31 MED ORDER — RAMELTEON 8 MG PO TABS
8.0000 mg | ORAL_TABLET | Freq: Every day | ORAL | Status: DC
  Filled 2021-05-31: qty 1

## 2021-05-31 MED ORDER — AMLODIPINE BESYLATE 5 MG PO TABS
5.0000 mg | ORAL_TABLET | Freq: Every day | ORAL | Status: DC
  Administered 2021-05-31: 5 mg via ORAL
  Filled 2021-05-31: qty 1

## 2021-05-31 NOTE — Progress Notes (Signed)
Ambulating oxygen saturations: ? ?Room Air @ rest = 98% ?Room air with exertion to the restroom and back= 92%.  ?Respiratory rate 17 nonlabored and equal. Patient sitting up in the chair. ?

## 2021-05-31 NOTE — Discharge Summary (Addendum)
? ?Name: Caitlyn Aguirre ?MRN: [id number] ?DOB: [birth year] 86 y.o. ?PCP: Axel Filler, MD ? ?Date of Admission: 05/30/2021 11:38 AM ?Date of Discharge: 05/31/2021 ?Attending Physician: Velna Ochs, MD ? ?Subjective: ?No acute events overnight. Reports she feels much better this morning, breathing back to baseline and body aches have subsided.  Discussed CT findings and plan for steroids. ? ?Discharge Diagnosis: ?1. Acute on chronic respiratory failure ?2. COVID-19 infection ?3. Possilbe Interstitial lung disease ?4. Chronic diastolic heart failure ? ?Discharge Medications: ?Allergies as of 05/31/2021   ?No Known Allergies ?  ? ?  ?Medication List  ?  ? ?TAKE these medications   ? ?Accu-Chek Guide Me w/Device Kit ?Check 1 times a day as instructed ?  ?Accu-Chek Guide test strip ?Generic drug: glucose blood ?Check blood sugar 1 time per day ?  ?Accu-Chek Softclix Lancets lancets ?Check 1 time a day as instructed ?  ?acetaminophen 325 MG tablet ?Commonly known as: TYLENOL ?Take 2 tablets (650 mg total) by mouth every 6 (six) hours as needed for mild pain (or Fever >/= 101). ?What changed: when to take this ?  ?albuterol 108 (90 Base) MCG/ACT inhaler ?Commonly known as: VENTOLIN HFA ?Inhale 1-2 puffs into the lungs every 6 (six) hours as needed for wheezing or shortness of breath. ?  ?amLODipine 5 MG tablet ?Commonly known as: NORVASC ?Take 1 tablet (5 mg total) by mouth daily. ?  ?atorvastatin 40 MG tablet ?Commonly known as: LIPITOR ?Take 1 tablet (40 mg total) by mouth daily. ?  ?benzonatate 100 MG capsule ?Commonly known as: Best boy ?Take 1 capsule (100 mg total) by mouth 3 (three) times daily as needed for cough. ?  ?carvedilol 25 MG tablet ?Commonly known as: COREG ?Take 1 tablet (25 mg total) by mouth 2 (two) times daily with a meal. ?What changed: when to take this ?  ?chlorthalidone 25 MG tablet ?Commonly known as: HYGROTON ?Take 1 tablet (25 mg total) by mouth daily. ?  ?diclofenac Sodium 1 %  Gel ?Commonly known as: Voltaren ?Apply 2 g topically 4 (four) times daily. ?What changed:  ?when to take this ?reasons to take this ?  ?guaiFENesin 600 MG 12 hr tablet ?Commonly known as: Libby ?Take 1 tablet (600 mg total) by mouth 2 (two) times daily for 7 days. ?  ?latanoprost 0.005 % ophthalmic solution ?Commonly known as: XALATAN ?Place 1 drop into both eyes at bedtime. ?  ?levothyroxine 75 MCG tablet ?Commonly known as: SYNTHROID ?Take 1 tablet (75 mcg total) by mouth daily. ?  ?loratadine 10 MG tablet ?Commonly known as: CLARITIN ?Take 1 tablet (10 mg total) by mouth daily. ?  ?metFORMIN 1000 MG tablet ?Commonly known as: GLUCOPHAGE ?Take 1 tablet (1,000 mg total) by mouth daily with breakfast. ?  ?omeprazole 20 MG capsule ?Commonly known as: PRILOSEC ?Take 1 capsule (20 mg total) by mouth daily. ?  ?OXYGEN ?Inhale 2 L into the lungs daily as needed (for breathing). ?  ?PARoxetine 30 MG tablet ?Commonly known as: PAXIL ?Take 1 tablet (30 mg total) by mouth daily. ?  ?predniSONE 20 MG tablet ?Commonly known as: DELTASONE ?Take 2 tablets (40 mg total) by mouth daily with breakfast for 14 days. ?  ?ramelteon 8 MG tablet ?Commonly known as: ROZEREM ?Take 1 tablet (8 mg total) by mouth at bedtime. ?  ?Spiriva HandiHaler 18 MCG inhalation capsule ?Generic drug: tiotropium ?INHALE THE CONTENTS OF 1  CAPSULE BY MOUTH VIA  HANDIHALER DAILY ?What changed: See the new instructions. ?  ? ?  ? ? ?  Disposition and follow-up:   ?[name] was discharged from [facility] in Stable condition.  At the hospital follow up visit please address: ? ?1. Acute on chronic respiratory failure: Likely multi-factorial, COVID-19 infection and ILD (new diagnosis). Was discharged with two weeks of steroids, will need to decide if further steroids are indicated. Likely will need referral to pulmonology, possibly rheumatology. ? ?2. Interstitial lung disease: Found on CT incidentally. PFT's in 2013 showed  restrictive pattern. Will need pulm referral. Also w/ hx polymyalgia rheumatica, work-up started for MCTD. Possibly needs rheum referral. ? ?3.  Labs / imaging needed at time of follow-up: BMP ? ?4.  Pending labs/ test needing follow-up: ANA, C3/C4, RF, anti-CCP, anti-Ro, anti-La, anti-dsDNA, anti-Smith ? ?Follow-up Appointments: ? ? Follow-up Information   ? ? Axel Filler, MD. Schedule an appointment as soon as possible for a visit in 9 day(s).   ?Specialty: Internal Medicine ?Why: The clinic will call you to make an appointment on the week of May 8th. ?Contact information: ?[address] ?[address] ?[place] [address] ?[phone] ? ? ?  ?  ? ?  ?  ? ?  ?  ? ?Hospital Course by problem list: ?1. Acute on chronic respiratory failure: ?[name] presented to [facility] after one day history of body aches, dyspnea, congestion, and fatigue. She also reported increased productive cough. She is unvaccinated for COVID-19. On arrival, patient was hemodynamically stable on 4L supplemental oxygen. Work-up revealed positive COVID-19 test. During work-up, CT chest was obtained, which revealed interstitial lung disease. She received one dose of steroids and was admitted to IMTS. On day 1 of admission, patient reported improvement of symptoms and ambulatory pulse oxygenation appropriate on room air. Unclear how much of patient's presentation is ILD vs COVID-19, therefore will have her continue steroids for at least two weeks. Patient was subsequently discharged on prednisone with plans to follow-up with Internal Medicine Clinic during the week of May 8th. ? ?2. Interstitial lung disease ?On CT chest on admission, found to have chronic interstitial lung disease, progressive from 2020, favorite NSIP. Previous PFT's in 2013 revealed restrictive pattern of lung disease. She has not seen pulmonology in outpatient setting. Patient also has a remote history of polymyalgia rheumatica without tolerance of long-term  prednisone. Therefore, we have started serologic work-up for MCTD, will have primary care physician to follow-up with this. Likely patient would benefit from pulm, rheum referral and repeat PFT's.  ? ?3. Chronic diastolic heart failure ?Patient remained euvolemic during hospitalization. Last TTE in 2020 w/ EF 55-60%. On admission, patient underwent r/p Echo, which revealed EF 55-60%, no RWMA's, mild LVH, grade I diastolic dysfunction, mildly dilated LA. Will continue with patient's home regimen. ? ?Discharge Exam:   ?BP (!) 92/39 (BP Location: Left Arm)   Pulse 65   Temp 98.6 ?F (37 ?C) (Oral)   Resp 17   Ht _0  (1.727 m)   Wt 86.3 kg   SpO2 92%   BMI 28.93 kg/m?  ?Discharge exam:  ?General: Resting comfortably in no acute distress ?CV: Regular rate, rhythm. No murmurs appreciated.  ?Pulm: Normal work of breathing on 2L supplemental O2. Mild rhonchi throughout. ?MSK: Normal bulk, tone. No pitting edema bilateral lower extremities. ?Neuro: Awake, alert, conversing appropriately. ?Psych: Normal mood, affect, speech. ? ?Pertinent Labs, Studies, and Procedures:  ? ?  Latest Ref Rng & Units 05/31/2021  ?  4:38 AM 05/30/2021  ?  6:34 PM 12/16/2020  ? 10:59  AM  ?CBC  ?WBC 4.0 - 10.5 K/uL 6.0   8.0   6.5    ?Hemoglobin 12.0 - 15.0 g/dL 9.3   9.2   10.3    ?Hematocrit 36.0 - 46.0 % 28.7   28.3   31.4    ?Platelets 150 - 400 K/uL 180   175   204    ?  ? ?  Latest Ref Rng & Units 05/31/2021  ?  4:38 AM 05/30/2021  ?  6:34 PM 03/24/2021  ? 10:32 AM  ?BMP  ?Glucose 70 - 99 mg/dL 114   112   184    ?BUN 8 - 23 mg/dL _0 ?Creatinine 0.44 - 1.00 mg/dL 1.18   1.24   1.39    ?BUN/Creat Ratio 12 - 28   18    ?Sodium 135 - 145 mmol/L 137   136   142    ?Potassium 3.5 - 5.1 mmol/L 4.2   4.1   5.2    ?Chloride 98 - 111 mmol/L 108   107   109    ?CO2 22 - 32 mmol/L _1 ?Calcium 8.9 - 10.3 mg/dL 9.8   9.6   9.9    ?  ?CT CHEST 05/31/2021 ?-Cardiovascular: The heart is normal in size. No pericardial effusion. No  evidence of thoracic aneurysm. Atherosclerotic calcifications of the arch. Coronary atherosclerosis of the LAD. ?-Mediastinum/Nodes: No suspicious mediastinal lymphadenopathy. ?-Lungs/Pleura: Mild centrilobular and paraseptal

## 2021-05-31 NOTE — Progress Notes (Signed)
*  PRELIMINARY RESULTS* ?Echocardiogram ?2D Echocardiogram has been performed. ? ?Caitlyn Aguirre ?05/31/2021, 12:25 PM ?

## 2021-06-01 LAB — URINE CULTURE

## 2021-06-01 LAB — C4 COMPLEMENT: Complement C4, Body Fluid: 51 mg/dL — ABNORMAL HIGH (ref 12–38)

## 2021-06-01 LAB — C3 COMPLEMENT: C3 Complement: 166 mg/dL (ref 82–167)

## 2021-06-01 LAB — RHEUMATOID FACTOR: Rheumatoid fact SerPl-aCnc: <10 IU/mL (ref <14.0)

## 2021-06-02 LAB — ANA W/REFLEX IF POSITIVE: Anti Nuclear Antibody (ANA): NEGATIVE

## 2021-06-02 LAB — ANTI-DNA ANTIBODY, DOUBLE-STRANDED: ds DNA Ab: <1 IU/mL (ref 0–9)

## 2021-06-02 LAB — SJOGRENS SYNDROME-A EXTRACTABLE NUCLEAR ANTIBODY: SSA (Ro) (ENA) Antibody, IgG: <0.2 AI (ref 0.0–0.9)

## 2021-06-02 LAB — SJOGRENS SYNDROME-B EXTRACTABLE NUCLEAR ANTIBODY: SSB (La) (ENA) Antibody, IgG: <0.2 AI (ref 0.0–0.9)

## 2021-06-02 LAB — ANTI-SMITH ANTIBODY: ENA SM Ab Ser-aCnc: <0.2 AI (ref 0.0–0.9)

## 2021-06-02 LAB — CYCLIC CITRUL PEPTIDE ANTIBODY, IGG/IGA: CCP Antibodies IgG/IgA: <1 units (ref 0–19)

## 2021-06-03 ENCOUNTER — Telehealth: Payer: Self-pay

## 2021-06-03 NOTE — Telephone Encounter (Signed)
Transition Care Management Unsuccessful Follow-up Telephone Call ? ?Date of discharge and from where:  [facility]; 05/30/21 ? ?Attempts:  1st Attempt ? ?Reason for unsuccessful TCM follow-up call:  Unable to leave message ? ?[name], RN, BSN, CCM ?Care Management Coordinator ?[facility] ?Phone: [phone]/KRC: [phone]  ? ?  ?

## 2021-06-03 NOTE — Telephone Encounter (Signed)
Transition Care Management Follow-up Telephone Call ?Date of discharge and from where: C[facility]lyn [facility]; 05/31/21 ?How have you been since you were released from the hospital? "I am feeling good since discharge from the hospital". ?Any questions or concerns? No ? ?Items Reviewed: ?Did the pt receive and understand the discharge instructions provided? Yes  ?Medications obtained and verified? Yes  ?Other? No  ?Any new allergies since your discharge? No  ?Dietary orders reviewed? No ?Do you have support at home? Yes - Patients daughter assists her ? ?Home Care and Equipment/Supplies: ?Were home health services ordered? no ?If so, what is the name of the agency? N/A ?Has the agency set up a time to come to the patient's home? not applicable ?Were any new equipment or medical supplies ordered?  No ?What is the name of the medical supply agency? N/A ?Were you able to get the supplies/equipment? not applicable ?Do you have any questions related to the use of the equipment or supplies? No ? ?Functional Questionnaire: (I = Independent and D = Dependent) ?ADLs: I ? ?Bathing/Dressing- I ? ?Meal Prep- D ? ?Eating- I ? ?Maintaining continence- I ? ?Transferring/Ambulation- I ? ?Managing Meds- I- daughter does assist picking up and managing medications ? ?Follow up appointments reviewed: ? ?PCP Hospital f/u appt confirmed? Yes  Scheduled to see Dr. Lisabeth Devoid on 06/11/21 '@1'$ :3 . ?[facility] f/u appt confirmed?  N/A ?Are transportation arrangements needed? No  ?If their condition worsens, is the pt aware to call PCP or go to the Emergency Dept.? Yes ?Was the patient provided with contact information for the PCP's office or ED? Yes ?Was to pt encouraged to call back with questions or concerns? Yes ? ?[name], RN, BSN, CCM ?Care Management Coordinator ?[facility] ?Phone: [phone]/UTM: [phone]   ?

## 2021-06-03 NOTE — Telephone Encounter (Signed)
?  Chronic Care Management  ? ?Note ? ?06/03/2021 ?Name: Caitlyn Aguirre MRN: [id number] DOB: 04-22-1935 ? ?Incoming call from patient requesting a refill on her Metformin as she is completely out.  Patient was admitted to acute on 05/30/21 for Covid and was d/c on 05/31/21. Reviewed patient chart and she has refill available at [facility].  Phone call back to patient to let her know she needs to contact pharmacy for refill. ?[name], RN, BSN, CCM ?Care Management Coordinator ?[facility] ?Phone: [phone]/LGS: [phone]  ? ? ?

## 2021-06-04 LAB — CULTURE, BLOOD (ROUTINE X 2)
Culture: NO GROWTH
Culture: NO GROWTH
Special Requests: ADEQUATE
Special Requests: ADEQUATE

## 2021-06-11 ENCOUNTER — Ambulatory Visit (INDEPENDENT_AMBULATORY_CARE_PROVIDER_SITE_OTHER): Payer: Medicare Other | Admitting: Student

## 2021-06-11 DIAGNOSIS — J438 Other emphysema: Secondary | ICD-10-CM

## 2021-06-11 DIAGNOSIS — U071 COVID-19: Secondary | ICD-10-CM | POA: Diagnosis not present

## 2021-06-11 DIAGNOSIS — Z87891 Personal history of nicotine dependence: Secondary | ICD-10-CM | POA: Diagnosis not present

## 2021-06-11 DIAGNOSIS — I10 Essential (primary) hypertension: Secondary | ICD-10-CM | POA: Diagnosis not present

## 2021-06-11 NOTE — Patient Instructions (Addendum)
It was a pleasure seeing you in clinic. I am sorry to hear you were hospitalized for COVID. It appears you are recovering well from [place]. We checked your oxygen in clinic today and you O2 remained above 90% and insurance will not cover portable oxygen. ? ?Dizziness ?Please stop taking your amlodipine as you blood pressure was dropping a little with standing  ?

## 2021-06-12 ENCOUNTER — Encounter: Payer: Self-pay | Admitting: Student

## 2021-06-12 MED ORDER — SPIRIVA HANDIHALER 18 MCG IN CAPS: 18.0000 ug | ORAL_CAPSULE | Freq: Every day | RESPIRATORY_TRACT | 3 refills | Status: DC

## 2021-06-12 NOTE — Progress Notes (Signed)
Internal Medicine Clinic Attending  I saw and evaluated the patient.  I personally confirmed the key portions of the history and exam documented by Dr. Liang and I reviewed pertinent patient test results.  The assessment, diagnosis, and plan were formulated together and I agree with the documentation in the resident's note.  

## 2021-06-12 NOTE — Assessment & Plan Note (Signed)
Presents today for hospital follow-up of recent COVID infection quiring hospitalization from 4/28 to 05/31/2021.  Patient today reports she is feeling close to baseline.  Is still taking some Mucinex for congestion.  She completed course of prednisone.  Reports breathing is close to baseline.  Some dyspnea with exertion with walking up stairs or hills for which she uses albuterol as needed.  This does not seem worse than prior baseline.  She satting well on room air without oxygen.  Some mild crackles in bilateral mid lung field. ? ?She did have extensive work-up while admitted with CT chest with concern for chronic interstitial lung disease [place] lower lobe.  Laboratory testing for underlying mixed connective tissue disease which was negative.  Patient seems to be functioning back at her baseline and does not have significant respiratory symptoms.  We will hold off on further work-up of this unless she had worsening symptoms. ?

## 2021-06-12 NOTE — Assessment & Plan Note (Signed)
Reports she continues to have dyspnea with exertion mostly associated walking up stairs.  She uses 2 L nasal cannula at nighttime and as needed.  She would like to have portable oxygen at home.  We obtained ambulatory oxygen in the office today.  Saturation 98% at rest.  Dropped down to 90% with walking.  Unable to walk for 6 minutes due to deconditioning.  Discussed that insurance would not approve her for portable oxygen as her saturations remained at or above 90% with exertion. ? ?She will continue her on Spiriva and albuterol for dyspnea and follow-up as needed for worsening symptoms. ?

## 2021-06-12 NOTE — Progress Notes (Signed)
? ?Established Patient Office Visit ? ?Subjective   ?Patient ID: Caitlyn Aguirre, female    DOB: 1936/02/01  Age: 86 y.o. MRN: [id number] ? ?Chief Complaint  ?Patient presents with  ? Hospitalization Follow-up  ? ? ?Caitlyn Aguirre is an 86 year old person who presents for follow-up for recent admission for COVID-pneumonia on 05/30/2021.  She is here today with her daughter and granddaughter in the room.  Reports she is doing well since admission.  He is eating and drinking well.  Breathing feels close to baseline.  She is no longer having significant cough or shortness of breath at rest. ? ? ?Patient Active Problem List  ? Diagnosis Date Noted  ? ILD (interstitial lung disease) ([place])   ? COVID-19 virus infection 05/30/2021  ? Acute bronchitis 03/24/2021  ? Tendinopathy of rotator cuff, left 12/16/2020  ? CKD (chronic kidney disease) stage 3, GFR 30-59 ml/min (HCC) 05/08/2019  ? High Risk for Falls 07/18/2015  ? GERD (gastroesophageal reflux disease) 06/16/2015  ? Urinary, incontinence, stress female 05/09/2015  ? Insomnia 05/09/2015  ? Eczema of hand 06/21/2013  ? Preventive measure 02/17/2013  ? Obstructive sleep apnea 12/25/2011  ? Hypothyroidism 01/21/2006  ? Depression 01/21/2006  ? Essential hypertension 01/21/2006  ? COPD ([place]) 01/21/2006  ? Osteoarthritis 01/21/2006  ? Polymyalgia rheumatica ([place]) 01/21/2006  ? Type 2 diabetes mellitus with other specified complication ([place]) 84/21/1939  ? ?  ? ?Review of Systems  ?Constitutional:  Negative for chills, fever and malaise/fatigue.  ?HENT:  Positive for congestion. Negative for sinus pain and sore throat.   ?Respiratory:  Positive for cough (Mild) and shortness of breath (With exertion). Negative for hemoptysis and wheezing.   ?Cardiovascular:  Negative for chest pain.  ?Gastrointestinal:  Negative for abdominal pain, diarrhea, nausea and vomiting.  ?Musculoskeletal:  Negative for myalgias and neck pain.  ?Neurological:  Positive for dizziness. Negative for focal  weakness, seizures and weakness.  ?All other systems reviewed and are negative. ? ?  ?Objective:  ?  ? ?BP 100/62 (BP Location: Right Arm, Cuff Size: Small)   Pulse 68   Temp 98 ?F (36.7 ?C) (Oral)   Ht '5\' 8"'$  (1.727 m)   Wt 195 lb 8 oz (88.7 kg)   SpO2 98%   BMI 29.73 kg/m?  ?  ? ?Physical Exam ?Constitutional:   ?   Appearance: Normal appearance.  ?   Comments: Sitting in wheelchair in no acute distress.  ?HENT:  ?   Head: Normocephalic and atraumatic.  ?   Nose: No congestion or rhinorrhea.  ?   Mouth/Throat:  ?   Mouth: Mucous membranes are moist.  ?   Pharynx: Oropharynx is clear.  ?Eyes:  ?   Extraocular Movements: Extraocular movements intact.  ?   Conjunctiva/sclera: Conjunctivae normal.  ?   Pupils: Pupils are equal, round, and reactive to light.  ?Cardiovascular:  ?   Rate and Rhythm: Normal rate and regular rhythm.  ?   Pulses: Normal pulses.  ?Pulmonary:  ?   Effort: Pulmonary effort is normal. No respiratory distress.  ?   Breath sounds: Normal breath sounds.  ?   Comments: Mild crackling mid lung fields. ?Abdominal:  ?   General: Abdomen is flat. Bowel sounds are normal.  ?   Palpations: Abdomen is soft.  ?Musculoskeletal:  ?   Right lower leg: No edema.  ?   Left lower leg: No edema.  ?Skin: ?   General: Skin is warm and dry.  ?Neurological:  ?  General: No focal deficit present.  ?   Mental Status: She is alert and oriented to person, place, and time.  ?Psychiatric:     ?   Mood and Affect: Mood normal.     ?   Behavior: Behavior normal.  ? ? ? ?No results found for any visits on 06/11/21. ? ?  ? ?The ASCVD Risk score (Arnett DK, et al., 2019) failed to calculate for the following reasons: ?  The 2019 ASCVD risk score is only valid for ages 40 to 18 ? ?  ?Assessment & Plan:  ? ?Problem List Items Addressed This Visit   ? ?  ? Cardiovascular and Mediastinum  ? Essential hypertension (Chronic)  ?  Ports some dizziness with standing up.  Orthostatics today with.  Orthostatics obtained BP 126/65  laying, 111/62 sitting, and 100/62 standing.  Rate remained between 60 and 73.  Did report dizziness with standing.  She is taking amlodipine 5, chlorthalidone 25 mg daily.  We will hold her amlodipine. ? ?  ?  ?  ? Respiratory  ? COPD ([place]) (Chronic)  ?  Reports she continues to have dyspnea with exertion mostly associated walking up stairs.  She uses 2 L nasal cannula at nighttime and as needed.  She would like to have portable oxygen at home.  We obtained ambulatory oxygen in the office today.  Saturation 98% at rest.  Dropped down to 90% with walking.  Unable to walk for 6 minutes due to deconditioning.  Discussed that insurance would not approve her for portable oxygen as her saturations remained at or above 90% with exertion. ? ?She will continue her on Spiriva and albuterol for dyspnea and follow-up as needed for worsening symptoms. ? ?  ?  ? Relevant Medications  ? tiotropium (SPIRIVA HANDIHALER) 18 MCG inhalation capsule  ?  ? Other  ? COVID-19 virus infection  ?  Presents today for hospital follow-up of recent COVID infection quiring hospitalization from 4/28 to 05/31/2021.  Patient today reports she is feeling close to baseline.  Is still taking some Mucinex for congestion.  She completed course of prednisone.  Reports breathing is close to baseline.  Some dyspnea with exertion with walking up stairs or hills for which she uses albuterol as needed.  This does not seem worse than prior baseline.  She satting well on room air without oxygen.  Some mild crackles in bilateral mid lung field. ? ?She did have extensive work-up while admitted with CT chest with concern for chronic interstitial lung disease [place] lower lobe.  Laboratory testing for underlying mixed connective tissue disease which was negative.  Patient seems to be functioning back at her baseline and does not have significant respiratory symptoms.  We will hold off on further work-up of this unless she had worsening symptoms. ? ?  ?  ? ? ?Return  in about 3 months (around 09/11/2021), or if symptoms worsen or fail to improve.  ? ? ?Iona Beard, MD ? ?

## 2021-06-12 NOTE — Addendum Note (Signed)
Addended by: Iona Beard on: 06/12/2021 04:17 PM ? ? Modules accepted: Level of Service ? ?

## 2021-06-12 NOTE — Assessment & Plan Note (Signed)
Ports some dizziness with standing up.  Orthostatics today with.  Orthostatics obtained BP 126/65 laying, 111/62 sitting, and 100/62 standing.  Rate remained between 60 and 73.  Did report dizziness with standing.  She is taking amlodipine 5, chlorthalidone 25 mg daily.  We will hold her amlodipine. ?

## 2021-06-23 ENCOUNTER — Encounter: Payer: Medicare Other | Admitting: Student in an Organized Health Care Education/Training Program

## 2021-07-11 ENCOUNTER — Other Ambulatory Visit: Payer: Self-pay | Admitting: *Deleted

## 2021-07-11 DIAGNOSIS — I10 Essential (primary) hypertension: Secondary | ICD-10-CM

## 2021-07-11 DIAGNOSIS — R109 Unspecified abdominal pain: Secondary | ICD-10-CM

## 2021-07-11 MED ORDER — PAROXETINE HCL 30 MG PO TABS: 30.0000 mg | ORAL_TABLET | Freq: Every day | ORAL | 3 refills | Status: DC

## 2021-07-11 MED ORDER — AMLODIPINE BESYLATE 5 MG PO TABS: 5.0000 mg | ORAL_TABLET | Freq: Every day | ORAL | 3 refills | Status: DC

## 2021-07-11 MED ORDER — ATORVASTATIN CALCIUM 40 MG PO TABS: 40.0000 mg | ORAL_TABLET | Freq: Every day | ORAL | 3 refills | Status: DC

## 2021-07-11 MED ORDER — CHLORTHALIDONE 25 MG PO TABS: 25.0000 mg | ORAL_TABLET | Freq: Every day | ORAL | 3 refills | Status: DC

## 2021-07-15 ENCOUNTER — Other Ambulatory Visit: Payer: Self-pay | Admitting: *Deleted

## 2021-07-15 NOTE — Telephone Encounter (Signed)
Chlorthalidone was refilled 6/9 as "No Print"; please re-send electronically.  Thanks

## 2021-07-16 MED ORDER — CHLORTHALIDONE 25 MG PO TABS: 25.0000 mg | ORAL_TABLET | Freq: Every day | ORAL | 3 refills | Status: DC

## 2021-08-14 DIAGNOSIS — G4733 Obstructive sleep apnea (adult) (pediatric): Secondary | ICD-10-CM | POA: Diagnosis not present

## 2021-08-14 DIAGNOSIS — J449 Chronic obstructive pulmonary disease, unspecified: Secondary | ICD-10-CM | POA: Diagnosis not present

## 2021-08-14 DIAGNOSIS — R296 Repeated falls: Secondary | ICD-10-CM | POA: Diagnosis not present

## 2021-08-14 DIAGNOSIS — M79604 Pain in right leg: Secondary | ICD-10-CM | POA: Diagnosis not present

## 2021-08-21 ENCOUNTER — Other Ambulatory Visit: Payer: Self-pay

## 2021-08-21 DIAGNOSIS — I10 Essential (primary) hypertension: Secondary | ICD-10-CM

## 2021-08-21 MED ORDER — CARVEDILOL 25 MG PO TABS: 25.0000 mg | ORAL_TABLET | Freq: Two times a day (BID) | ORAL | 3 refills | Status: DC

## 2021-09-14 DIAGNOSIS — R296 Repeated falls: Secondary | ICD-10-CM | POA: Diagnosis not present

## 2021-09-14 DIAGNOSIS — M79604 Pain in right leg: Secondary | ICD-10-CM | POA: Diagnosis not present

## 2021-09-14 DIAGNOSIS — G4733 Obstructive sleep apnea (adult) (pediatric): Secondary | ICD-10-CM | POA: Diagnosis not present

## 2021-09-14 DIAGNOSIS — J449 Chronic obstructive pulmonary disease, unspecified: Secondary | ICD-10-CM | POA: Diagnosis not present

## 2021-09-25 ENCOUNTER — Telehealth: Payer: Self-pay | Admitting: *Deleted

## 2021-09-25 NOTE — Telephone Encounter (Signed)
Call from pt c/o "feel sick all over"; c/o dizziness when standing and sob when walking from the bedroom to the kitchen. Requesting an appt - informed no available appts until next week. Instructed pt to go to th ER or UC - stated she will.

## 2021-09-26 ENCOUNTER — Ambulatory Visit (INDEPENDENT_AMBULATORY_CARE_PROVIDER_SITE_OTHER): Payer: Medicare Other

## 2021-09-26 ENCOUNTER — Encounter: Payer: Self-pay | Admitting: Emergency Medicine

## 2021-09-26 ENCOUNTER — Other Ambulatory Visit: Payer: Self-pay

## 2021-09-26 ENCOUNTER — Ambulatory Visit
Admission: EM | Admit: 2021-09-26 | Discharge: 2021-09-26 | Disposition: A | Discharge status: Home / Self Care | Payer: Medicare Other | Attending: Physician Assistant | Admitting: Physician Assistant

## 2021-09-26 DIAGNOSIS — R531 Weakness: Secondary | ICD-10-CM

## 2021-09-26 DIAGNOSIS — R0602 Shortness of breath: Secondary | ICD-10-CM

## 2021-09-26 DIAGNOSIS — R42 Dizziness and giddiness: Secondary | ICD-10-CM | POA: Insufficient documentation

## 2021-09-26 DIAGNOSIS — R918 Other nonspecific abnormal finding of lung field: Secondary | ICD-10-CM | POA: Diagnosis not present

## 2021-09-26 DIAGNOSIS — J439 Emphysema, unspecified: Secondary | ICD-10-CM | POA: Diagnosis not present

## 2021-09-26 LAB — POCT URINALYSIS DIP (MANUAL ENTRY)
Bilirubin, UA: NEGATIVE
Glucose, UA: NEGATIVE mg/dL
Ketones, POC UA: NEGATIVE mg/dL
Nitrite, UA: NEGATIVE
Protein Ur, POC: 30 mg/dL — AB
Spec Grav, UA: 1.02 (ref 1.010–1.025)
Urobilinogen, UA: 1 E.U./dL
pH, UA: 5.5 (ref 5.0–8.0)

## 2021-09-26 LAB — POCT FASTING CBG KUC MANUAL ENTRY: POCT Glucose (KUC): 181 mg/dL — AB (ref 70–99)
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
## 2021-09-26 NOTE — Discharge Instructions (Signed)
Your x-ray showed some scarring in the base of your lung but otherwise no evidence of edema or infection.  They do recommend that you have this repeated in 3 weeks with your primary care.  Your EKG was normal.  We will contact you with your lab work.  Please make sure that you are resting and drinking plenty of fluids.  I would like you to follow-up with your primary care provider first thing next week.  If you have any worsening symptoms including shortness of breath, chest pain, feeling like you are going to pass out, passing out, nausea/vomiting you need to go to the emergency room immediately.

## 2021-09-27 LAB — CBC WITH DIFFERENTIAL/PLATELET
Basophils Absolute: 0 10*3/uL (ref 0.0–0.2)
Basos: 0 %
EOS (ABSOLUTE): 0.3 10*3/uL (ref 0.0–0.4)
Eos: 5 %
Hematocrit: 30.9 % — ABNORMAL LOW (ref 34.0–46.6)
Hemoglobin: 9.8 g/dL — ABNORMAL LOW (ref 11.1–15.9)
Immature Grans (Abs): 0 10*3/uL (ref 0.0–0.1)
Immature Granulocytes: 0 %
Lymphocytes Absolute: 1.2 10*3/uL (ref 0.7–3.1)
Lymphs: 16 %
MCH: 25.1 pg — ABNORMAL LOW (ref 26.6–33.0)
MCHC: 31.7 g/dL (ref 31.5–35.7)
MCV: 79 fL (ref 79–97)
Monocytes Absolute: 0.7 10*3/uL (ref 0.1–0.9)
Monocytes: 10 %
Neutrophils Absolute: 4.8 10*3/uL (ref 1.4–7.0)
Neutrophils: 69 %
Platelets: 212 10*3/uL (ref 150–450)
RBC: 3.9 x10E6/uL (ref 3.77–5.28)
RDW: 16.9 % — ABNORMAL HIGH (ref 11.7–15.4)
WBC: 7.1 10*3/uL (ref 3.4–10.8)

## 2021-09-27 LAB — COMPREHENSIVE METABOLIC PANEL
ALT: 8 IU/L (ref 0–32)
AST: 14 IU/L (ref 0–40)
Albumin/Globulin Ratio: 1.4 (ref 1.2–2.2)
Albumin: 4.2 g/dL (ref 3.7–4.7)
Alkaline Phosphatase: 142 IU/L — ABNORMAL HIGH (ref 44–121)
BUN/Creatinine Ratio: 15 (ref 12–28)
BUN: 22 mg/dL (ref 8–27)
Bilirubin Total: 0.5 mg/dL (ref 0.0–1.2)
CO2: 15 mmol/L — ABNORMAL LOW (ref 20–29)
Calcium: 10.4 mg/dL — ABNORMAL HIGH (ref 8.7–10.3)
Chloride: 103 mmol/L (ref 96–106)
Creatinine, Ser: 1.42 mg/dL — ABNORMAL HIGH (ref 0.57–1.00)
Globulin, Total: 2.9 g/dL (ref 1.5–4.5)
Glucose: 155 mg/dL — ABNORMAL HIGH (ref 70–99)
Potassium: 4.8 mmol/L (ref 3.5–5.2)
Sodium: 138 mmol/L (ref 134–144)
Total Protein: 7.1 g/dL (ref 6.0–8.5)
eGFR: 36 mL/min/{1.73_m2} — ABNORMAL LOW (ref >59)

## 2021-09-28 ENCOUNTER — Telehealth (HOSPITAL_COMMUNITY): Payer: Self-pay | Admitting: Physician Assistant

## 2021-09-28 LAB — URINE CULTURE: Culture: >=100000 — AB

## 2021-09-28 MED ORDER — SULFAMETHOXAZOLE-TRIMETHOPRIM 800-160 MG PO TABS: 1.0000 | ORAL_TABLET | Freq: Two times a day (BID) | ORAL | 0 refills | Status: DC

## 2021-09-28 NOTE — Telephone Encounter (Signed)
Called patient to discuss laboratory results.  Urine culture was positive for E. coli.  It is sensitive to Bactrim DS.  Prescription for this medication was sent to pharmacy.  No indication for dose adjustment based on CMP from 09/26/2021 with creatinine of 1.42 and calculated creatinine clearance of 41.18 mL/min.

## 2021-09-29 ENCOUNTER — Telehealth: Payer: Self-pay

## 2021-09-29 NOTE — Telephone Encounter (Signed)
Pt's daughter requesting to speak with nurse about her mother not feeling well, went to the urgent care on Saturday.Since Saturday, her mother is still sick. Requesting appt for today, no open slot. Please call pt back.

## 2021-09-30 ENCOUNTER — Telehealth: Payer: Self-pay | Admitting: *Deleted

## 2021-09-30 NOTE — Telephone Encounter (Addendum)
Call from Patient's daughter states patient went to Urgent Care recently and is  not feeling any better.   Seen for shortness of breath given an appointment for tomorrow afternoon at 1:45 pm for follow up.

## 2021-10-01 ENCOUNTER — Ambulatory Visit (INDEPENDENT_AMBULATORY_CARE_PROVIDER_SITE_OTHER): Payer: Medicare Other | Admitting: Internal Medicine

## 2021-10-01 VITALS — BP 106/57 | HR 70 | Temp 98.1°F | Ht 68.0 in | Wt 198.0 lb

## 2021-10-01 DIAGNOSIS — Z7984 Long term (current) use of oral hypoglycemic drugs: Secondary | ICD-10-CM

## 2021-10-01 DIAGNOSIS — R0902 Hypoxemia: Secondary | ICD-10-CM

## 2021-10-01 DIAGNOSIS — R42 Dizziness and giddiness: Secondary | ICD-10-CM

## 2021-10-01 DIAGNOSIS — E1169 Type 2 diabetes mellitus with other specified complication: Secondary | ICD-10-CM

## 2021-10-01 DIAGNOSIS — R748 Abnormal levels of other serum enzymes: Secondary | ICD-10-CM

## 2021-10-01 DIAGNOSIS — Z87891 Personal history of nicotine dependence: Secondary | ICD-10-CM | POA: Diagnosis not present

## 2021-10-01 DIAGNOSIS — N1832 Chronic kidney disease, stage 3b: Secondary | ICD-10-CM | POA: Diagnosis not present

## 2021-10-01 DIAGNOSIS — E1122 Type 2 diabetes mellitus with diabetic chronic kidney disease: Secondary | ICD-10-CM | POA: Diagnosis not present

## 2021-10-01 DIAGNOSIS — J438 Other emphysema: Secondary | ICD-10-CM | POA: Diagnosis not present

## 2021-10-01 LAB — POCT GLYCOSYLATED HEMOGLOBIN (HGB A1C): Hemoglobin A1C: 7.1 % — AB (ref 4.0–5.6)

## 2021-10-01 LAB — GLUCOSE, CAPILLARY: Glucose-Capillary: 196 mg/dL — ABNORMAL HIGH (ref 70–99)

## 2021-10-01 MED ORDER — SPIRIVA HANDIHALER 18 MCG IN CAPS: 18.0000 ug | ORAL_CAPSULE | Freq: Every day | RESPIRATORY_TRACT | 3 refills | Status: DC

## 2021-10-01 NOTE — Patient Instructions (Signed)
Thank you, Ms.Kamyrah S Pitta for allowing [place] to provide your care today. Today we discussed:  I am glad you are feeling better. I am repeating blood work to follow-up on liver studies. Please stop taking bactrim. This antibiotic is not good in the setting our your kidney function.  COPD- please start using Spiriva once daily. You can continue to use albuterol inhaler as needed. I will reach out to one of our staff members about getting portable oxygen carrier for you.  Diabetes- your A1c is a little elevated from May. Please take metformin 1000 mg once daily.     I have ordered the following labs for you:  Lab Orders         Glucose, capillary         POC Hbg A1C      Referrals ordered today:   Referral Orders  No referral(s) requested today     I have ordered the following medication/changed the following medications:   Stop the following medications: Medications Discontinued During This Encounter  Medication Reason   sulfamethoxazole-trimethoprim (BACTRIM DS) 800-160 MG tablet      Start the following medications: No orders of the defined types were placed in this encounter.    Follow up:  4 weeks or sooner    We look forward to seeing you next time. Please call our clinic at [phone] if you have any questions or concerns. The best time to call is Monday-Friday from 9am-4pm, but there is someone available 24/7. If after hours or the weekend, call the main hospital number and ask for the Internal Medicine Resident On-Call. If you need medication refills, please notify your pharmacy one week in advance and they will send [place] a request.   Thank you for trusting me with your care. Wishing you the best!   Christiana Fuchs, [place]

## 2021-10-02 ENCOUNTER — Encounter: Payer: Self-pay | Admitting: Internal Medicine

## 2021-10-02 DIAGNOSIS — R42 Dizziness and giddiness: Secondary | ICD-10-CM | POA: Insufficient documentation

## 2021-10-02 LAB — CMP14 + ANION GAP
ALT: 7 IU/L (ref 0–32)
AST: 14 IU/L (ref 0–40)
Albumin/Globulin Ratio: 1.5 (ref 1.2–2.2)
Albumin: 4.2 g/dL (ref 3.7–4.7)
Alkaline Phosphatase: 117 IU/L (ref 44–121)
Anion Gap: 12 mmol/L (ref 10.0–18.0)
BUN/Creatinine Ratio: 12 (ref 12–28)
BUN: 21 mg/dL (ref 8–27)
Bilirubin Total: 0.4 mg/dL (ref 0.0–1.2)
CO2: 19 mmol/L — ABNORMAL LOW (ref 20–29)
Calcium: 10.2 mg/dL (ref 8.7–10.3)
Chloride: 108 mmol/L — ABNORMAL HIGH (ref 96–106)
Creatinine, Ser: 1.69 mg/dL — ABNORMAL HIGH (ref 0.57–1.00)
Globulin, Total: 2.8 g/dL (ref 1.5–4.5)
Glucose: 103 mg/dL — ABNORMAL HIGH (ref 70–99)
Potassium: 4.8 mmol/L (ref 3.5–5.2)
Sodium: 139 mmol/L (ref 134–144)
Total Protein: 7 g/dL (ref 6.0–8.5)
eGFR: 30 mL/min/{1.73_m2} — ABNORMAL LOW (ref >59)

## 2021-10-02 LAB — GAMMA GT: GGT: 11 IU/L (ref 0–60)

## 2021-10-02 NOTE — Assessment & Plan Note (Addendum)
She states that she has had worsening dizziness over the last week that occurs with ambulation. She presented to urgent care last Saturday for this. At that time she was not having fever, chills, dysuria, change in urinary frequency, cough or change in appetite. UA was collected and + for nitrates. Urine culture grew >100,000 Ecoli. She was prescribed 14 tablets of bactrim and had been taking them once a day for 3 doses. She reports that she feels better after starting antibiotic and continues to not have dysuria or change in frequency. Her blood pressure was 106/57. Orthostatic vital signs were negative, but she has vision darkening with sitting up from laying down. Ambulatory saturations decreased from 96% with sitting to 85% with ambulation that improved with 2L. A/P: I think that dizziness is 2/2 to hypoxia with ambulation. She is currently only using oxygen concentrator to sleep. I do not think that she has a bladder infection and asked her to discontinue bactrim. -DME portable oxygen -f/u in 2 weeks  -dc bactrim

## 2021-10-02 NOTE — Assessment & Plan Note (Addendum)
Patient presents for follow-up after urgent care. Her baseline creatinine is around 1.4 with GFR in 30-40s. She was prescribed bactrim for asymptomatic bacteruria at urgent care and has taken 3 days of antibiotic. Repeat CMP showed elevation of creatinine from baseline at 1.6.  A/P: I called and reviewed lab with patient. I think that elevation of creatinine is likely reflection of taking bactrim. I talked with her about increasing fluid intake and coming back sooner for repeat labs. I will message front desk to help get that set up. Follow-up in 2 weeks to repeat BMP.

## 2021-10-02 NOTE — Assessment & Plan Note (Signed)
Patient presented to urgent care for light headedness. CMP showed elevated Alk phosphatase at 142. She denies abdominal pain at that time or since then. A/P: Repeated BMP for further characterization of Alk phos with GGT. Repeat CMP showed normalization of alkaline phosphatase. -BMP -GGT

## 2021-10-02 NOTE — Assessment & Plan Note (Addendum)
Caitlyn Aguirre has history of CKD stage 3a and presents with well-controlled type 2 diabetes. She has been taking metformin 1000 mg qd. Repeat HgbA1c increased from 6.2 to 7.1.  A/P: We discussed dietary changes. She had 2 family members pass away over the summer and she has not been watching what she is eating.  -Continue metformin 1000 mg qd -follow-up in 12/23

## 2021-10-02 NOTE — Assessment & Plan Note (Addendum)
Patient presents with several weeks of worsening shortness of breath. She states that she feels ok when she is sitting but with standing she develops shortness of breath. She uses 2L of oxygen at nighttime from home concentrator. She was seen in 5/23 for dyspnea and did not qualify for oxygen at that time. She does not use Spiriva. We talked about if she had difficulty with crushing the pill with her PMR and OA and she stated that she was able to work inhaler fine but had ran out and never refilled medication. She uses albuterol on occasion without relief.  Saturations 96% at rest and she dropped down to 85% with walking. A/P: -refilled spiriva -DME for portable oxygen
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
## 2021-10-03 NOTE — Progress Notes (Signed)
Internal Medicine Clinic Attending  I saw and evaluated the patient.  I personally confirmed the key portions of the history and exam documented by Dr. Masters and I reviewed pertinent patient test results.  The assessment, diagnosis, and plan were formulated together and I agree with the documentation in the resident's note.  

## 2021-10-08 NOTE — Progress Notes (Signed)
SATURATION QUALIFICATIONS: (This note is used to comply with regulatory documentation for home oxygen)  Patient Saturations on Room Air at Rest = 96%  Patient Saturations on Room Air while Ambulating = 85%  Patient Saturations on 2 Liters of oxygen while Ambulating = 96%  Please briefly explain why patient needs home oxygen: see physician notes

## 2021-10-13 NOTE — Addendum Note (Signed)
Addended by: Edwyna Perfect on: 10/13/2021 04:47 PM   Modules accepted: Orders

## 2021-10-15 DIAGNOSIS — R296 Repeated falls: Secondary | ICD-10-CM | POA: Diagnosis not present

## 2021-10-15 DIAGNOSIS — J449 Chronic obstructive pulmonary disease, unspecified: Secondary | ICD-10-CM | POA: Diagnosis not present

## 2021-10-15 DIAGNOSIS — M79604 Pain in right leg: Secondary | ICD-10-CM | POA: Diagnosis not present

## 2021-10-15 DIAGNOSIS — G4733 Obstructive sleep apnea (adult) (pediatric): Secondary | ICD-10-CM | POA: Diagnosis not present

## 2021-10-16 ENCOUNTER — Ambulatory Visit (INDEPENDENT_AMBULATORY_CARE_PROVIDER_SITE_OTHER): Payer: Medicare Other | Admitting: Internal Medicine

## 2021-10-16 ENCOUNTER — Other Ambulatory Visit: Payer: Self-pay

## 2021-10-16 VITALS — BP 118/56 | HR 66 | Temp 97.8°F

## 2021-10-16 DIAGNOSIS — E1122 Type 2 diabetes mellitus with diabetic chronic kidney disease: Secondary | ICD-10-CM | POA: Diagnosis not present

## 2021-10-16 DIAGNOSIS — R42 Dizziness and giddiness: Secondary | ICD-10-CM | POA: Diagnosis not present

## 2021-10-16 DIAGNOSIS — Z7984 Long term (current) use of oral hypoglycemic drugs: Secondary | ICD-10-CM

## 2021-10-16 DIAGNOSIS — E1169 Type 2 diabetes mellitus with other specified complication: Secondary | ICD-10-CM

## 2021-10-16 DIAGNOSIS — K219 Gastro-esophageal reflux disease without esophagitis: Secondary | ICD-10-CM

## 2021-10-16 DIAGNOSIS — N1832 Chronic kidney disease, stage 3b: Secondary | ICD-10-CM | POA: Diagnosis not present

## 2021-10-16 DIAGNOSIS — I129 Hypertensive chronic kidney disease with stage 1 through stage 4 chronic kidney disease, or unspecified chronic kidney disease: Secondary | ICD-10-CM | POA: Diagnosis not present

## 2021-10-16 DIAGNOSIS — Z87891 Personal history of nicotine dependence: Secondary | ICD-10-CM | POA: Diagnosis not present

## 2021-10-16 DIAGNOSIS — I10 Essential (primary) hypertension: Secondary | ICD-10-CM

## 2021-10-16 NOTE — Patient Instructions (Addendum)
Thank you, Ms.Brendan S Brenton for allowing [place] to provide your care today.   Blood pressure Your blood pressure was low today. Please drink 2L of water daily. Stop taking AMLODIPINE, CHLORTHALIDONE, and CARVEDILOL. Follow-up next week to recheck blood pressure.  I have ordered the following labs for you:  Lab Orders         BMP8+Anion Gap      Referrals ordered today:   Referral Orders  No referral(s) requested today     I have ordered the following medication/changed the following medications:   Stop the following medications: Medications Discontinued During This Encounter  Medication Reason   carvedilol (COREG) 25 MG tablet Change in therapy   chlorthalidone (HYGROTON) 25 MG tablet Change in therapy   amLODipine (NORVASC) 5 MG tablet Change in therapy     Start the following medications: No orders of the defined types were placed in this encounter.    Follow up: 1 weeks   Remember:   We look forward to seeing you next time. Please call our clinic at [phone] if you have any questions or concerns. The best time to call is Monday-Friday from 9am-4pm, but there is someone available 24/7. If after hours or the weekend, call the main hospital number and ask for the Internal Medicine Resident On-Call. If you need medication refills, please notify your pharmacy one week in advance and they will send [place] a request.   Thank you for trusting me with your care. Wishing you the best!   Christiana Fuchs, [place]

## 2021-10-16 NOTE — Progress Notes (Unsigned)
  Hypoxia 8/30 ambulatory oxygen  Rest 96 Room air ambulating 85%   DM A1c 7.1   CKD stage 3b Last creatinine 1.69. baseline creatinine 1.5  She was taking advil for last week due to morning HA   insomnia

## 2021-10-17 MED ORDER — OMEPRAZOLE 20 MG PO CPDR: 20.0000 mg | DELAYED_RELEASE_CAPSULE | Freq: Every day | ORAL | 3 refills | Status: DC

## 2021-10-17 NOTE — Assessment & Plan Note (Signed)
Blood pressure medications discontinued.

## 2021-10-17 NOTE — Assessment & Plan Note (Addendum)
She has continued to have dizziness with ambulation. Portable oxygen has not been delivered yet. Oxygen saturation today at rest at 94%. Her blood pressure low at 94/52 then 90/52. Patient states that she has eaten today but has not drank any fluids. She was provided with pitcher of water. She took her blood pressure medication this morning including amlodipine and carvedilol. I reviewed medication list and last note mentions that she was last on chlorthalidone and amlodipine was discontinued. A: I think that dizziness is multifactorial in setting of hypoxia with ambulation and hypovolemia. Portable oxygen ordered 2 weeks ago, Ms. Hague called and company states that they are still processing order. Patient drank 500cc pitcher and blood pressure improved to 118/56. I talked with patient about importance of hydration, with goal of drinking 2L daily if able. Plan: She needs to drink fluids and hopefully portable oxygen will be delivered within the next week or so. I talked with her about getting a blood pressure cuff at home and checking BP if she starts to feel dizzy.  -stop amlodipine, medications were separated and her daughter will keep blood pressure medications away so that patient will not take. -stop carvedilol  -medication list updated

## 2021-10-17 NOTE — Assessment & Plan Note (Signed)
In setting of dehydration with hypotension will hold off on repeating blood work today. Follow-up scheduled for 9/28 with Dr. Lisabeth Devoid. Will plan to repeat BMP at that time.

## 2021-10-27 NOTE — Progress Notes (Signed)
Internal Medicine Clinic Attending  Case discussed with Dr. Masters  at the time of the visit.  We reviewed the resident's history and exam and pertinent patient test results.  I agree with the assessment, diagnosis, and plan of care documented in the resident's note.  

## 2021-10-30 ENCOUNTER — Encounter: Payer: Self-pay | Admitting: Student

## 2021-10-30 ENCOUNTER — Ambulatory Visit (INDEPENDENT_AMBULATORY_CARE_PROVIDER_SITE_OTHER): Payer: Medicare Other | Admitting: Student

## 2021-10-30 ENCOUNTER — Other Ambulatory Visit: Payer: Self-pay

## 2021-10-30 VITALS — BP 153/81 | HR 91 | Temp 98.4°F | Wt 198.1 lb

## 2021-10-30 DIAGNOSIS — Z299 Encounter for prophylactic measures, unspecified: Secondary | ICD-10-CM

## 2021-10-30 DIAGNOSIS — R42 Dizziness and giddiness: Secondary | ICD-10-CM | POA: Diagnosis not present

## 2021-10-30 DIAGNOSIS — I129 Hypertensive chronic kidney disease with stage 1 through stage 4 chronic kidney disease, or unspecified chronic kidney disease: Secondary | ICD-10-CM | POA: Diagnosis not present

## 2021-10-30 DIAGNOSIS — J449 Chronic obstructive pulmonary disease, unspecified: Secondary | ICD-10-CM

## 2021-10-30 DIAGNOSIS — N1832 Chronic kidney disease, stage 3b: Secondary | ICD-10-CM

## 2021-10-30 DIAGNOSIS — I10 Essential (primary) hypertension: Secondary | ICD-10-CM

## 2021-10-30 MED ORDER — AMLODIPINE BESYLATE 5 MG PO TABS: 5.0000 mg | ORAL_TABLET | Freq: Every day | ORAL | 11 refills | Status: DC

## 2021-10-30 NOTE — Patient Instructions (Addendum)
Please restart amlodipine 5 mg daily  We will contact the DME company regarding your portable oxygen. \  We will check labs today and I will call with results

## 2021-10-30 NOTE — Progress Notes (Signed)
SATURATION QUALIFICATIONS: (This note is used to comply with regulatory documentation for home oxygen)  Patient Saturations on Room Air at Rest = 94%  Patient Saturations on Room Air while Ambulating = 87%  Patient Saturations on 2 Liters of oxygen while Ambulating = 97%  Please briefly explain why patient needs home oxygen: Patient O2 Sats drop while ambulating. Needs POC.

## 2021-10-31 LAB — BMP8+ANION GAP
Anion Gap: 15 mmol/L (ref 10.0–18.0)
BUN/Creatinine Ratio: 15 (ref 12–28)
BUN: 17 mg/dL (ref 8–27)
CO2: 19 mmol/L — ABNORMAL LOW (ref 20–29)
Calcium: 10 mg/dL (ref 8.7–10.3)
Chloride: 106 mmol/L (ref 96–106)
Creatinine, Ser: 1.1 mg/dL — ABNORMAL HIGH (ref 0.57–1.00)
Glucose: 145 mg/dL — ABNORMAL HIGH (ref 70–99)
Potassium: 5.2 mmol/L (ref 3.5–5.2)
Sodium: 140 mmol/L (ref 134–144)
eGFR: 50 mL/min/{1.73_m2} — ABNORMAL LOW (ref >59)

## 2021-10-31 NOTE — Assessment & Plan Note (Signed)
Dizziness has now resolved after discontinuing her chlorthalidone and amlodipine.  Daughter reports she is drinking plenty of fluids.  Denies any falls difficulties with walking or standing aside from dyspnea.

## 2021-10-31 NOTE — Progress Notes (Signed)
Internal Medicine Clinic Attending ? ?Case discussed with Dr. Liang  At the time of the visit.  We reviewed the resident?s history and exam and pertinent patient test results.  I agree with the assessment, diagnosis, and plan of care documented in the resident?s note. ? ?

## 2021-10-31 NOTE — Assessment & Plan Note (Signed)
BP today is 153/81.  States her dizziness has resolved since discontinuing her antihypertensives.  We will restart amlodipine 5 mg daily given she is now hypertensive.

## 2021-10-31 NOTE — Assessment & Plan Note (Signed)
Declined influenza vaccine today.

## 2021-10-31 NOTE — Progress Notes (Signed)
Established Patient Office Visit  Subjective   Patient ID: Caitlyn Aguirre, female    DOB: [birth year]  Age: 86 y.o. MRN: [id number]  Chief Complaint  Patient presents with   Follow-up    Caitlyn Aguirre is a 86 year old person who presents to clinic today for follow-up dizziness and hypotension.   Patient Active Problem List   Diagnosis Date Noted   Dizziness 10/02/2021   ILD (interstitial lung disease) ([place])    Acute bronchitis 03/24/2021   Tendinopathy of rotator cuff, left 12/16/2020   CKD (chronic kidney disease) stage 3, GFR 30-59 ml/min ([place]) 05/08/2019   High Risk for Falls 07/18/2015   GERD (gastroesophageal reflux disease) 06/16/2015   Urinary, incontinence, stress female 05/09/2015   Insomnia 05/09/2015   Elevated alkaline phosphatase level 02/20/2014   Eczema of hand 06/21/2013   Preventive measure 02/17/2013   Obstructive sleep apnea 12/25/2011   Hypothyroidism 01/21/2006   Depression 01/21/2006   Essential hypertension 01/21/2006   COPD ([place]) 01/21/2006   Osteoarthritis 01/21/2006   Polymyalgia rheumatica ([place]) 01/21/2006   Type 2 diabetes mellitus with other specified complication ([place]) 16/60/6301      ROS: Negative as per HPI    Objective:     BP (!) 153/81 (BP Location: Left Arm, Patient Position: Sitting, Cuff Size: Normal)   Pulse 91   Temp 98.4 F (36.9 C) (Oral)   Wt 198 lb 1.6 oz (89.9 kg)   SpO2 97%   BMI 30.12 kg/m  BP Readings from Last 3 Encounters:  10/30/21 (!) 153/81  10/16/21 (!) 118/56  10/01/21 (!) 106/57    Physical Exam Constitutional:      General: She is not in acute distress.    Appearance: Normal appearance.  HENT:     Head: Normocephalic and atraumatic.     Mouth/Throat:     Mouth: Mucous membranes are moist.     Pharynx: Oropharynx is clear.  Eyes:     Extraocular Movements: Extraocular movements intact.     Conjunctiva/sclera: Conjunctivae normal.     Pupils: Pupils are equal, round, and reactive to light.   Cardiovascular:     Rate and Rhythm: Normal rate and regular rhythm.  Pulmonary:     Effort: Pulmonary effort is normal.     Breath sounds: Normal breath sounds. No rhonchi or rales.  Abdominal:     General: Abdomen is flat. Bowel sounds are normal.     Palpations: Abdomen is soft.  Musculoskeletal:     Right lower leg: No edema.     Left lower leg: No edema.  Skin:    General: Skin is warm and dry.  Neurological:     General: No focal deficit present.     Mental Status: She is alert and oriented to person, place, and time.  Psychiatric:        Mood and Affect: Mood normal.        Behavior: Behavior normal.      No results found for any visits on 10/30/21.    The ASCVD Risk score (Arnett DK, et al., 2019) failed to calculate for the following reasons:   The 2019 ASCVD risk score is only valid for ages 89 to 42    Assessment & Plan:   Problem List Items Addressed This Visit       Cardiovascular and Mediastinum   Essential hypertension (Chronic)    BP today is 153/81.  States her dizziness has resolved since discontinuing her antihypertensives.  We will restart amlodipine  5 mg daily given she is now hypertensive.      Relevant Medications   amLODipine (NORVASC) 5 MG tablet     Respiratory   COPD ([place]) (Chronic)    Patient continues to have dyspnea with exertion.  She is concerned she is not using her Spiriva correctly.  Observed patient using his.  That she does not have trouble with crushing of the medication or difficulty using the inhaler.  Does desaturate when ambulating without oxygen.  She is yet to receive her DME portable oxygen.  O2 saturation dropped to 87% while ambulating.  She requires 2 L of oxygen while ambulating to maintain her oxygen saturation.  We discussed with her home health company and they will need a new order for DME oxygen.       Relevant Orders   For home use only DME oxygen     Genitourinary   CKD (chronic kidney disease) stage 3, GFR  30-59 ml/min (HCC) - Primary (Chronic)   Relevant Orders   BMP8+Anion Gap     Other   Preventive measure (Chronic)    Declined influenza vaccine today.      Dizziness    Dizziness has now resolved after discontinuing her chlorthalidone and amlodipine.  Daughter reports she is drinking plenty of fluids.  Denies any falls difficulties with walking or standing aside from dyspnea.       Return in about 3 months (around 01/29/2022).    Iona Beard, MD

## 2021-10-31 NOTE — Assessment & Plan Note (Signed)
Patient continues to have dyspnea with exertion.  She is concerned she is not using her Spiriva correctly.  Observed patient using his.  That she does not have trouble with crushing of the medication or difficulty using the inhaler.  Does desaturate when ambulating without oxygen.  She is yet to receive her DME portable oxygen.  O2 saturation dropped to 87% while ambulating.  She requires 2 L of oxygen while ambulating to maintain her oxygen saturation.  We discussed with her home health company and they will need a new order for DME oxygen.

## 2021-11-05 ENCOUNTER — Telehealth: Payer: Self-pay | Admitting: Student in an Organized Health Care Education/Training Program

## 2021-11-05 DIAGNOSIS — G4733 Obstructive sleep apnea (adult) (pediatric): Secondary | ICD-10-CM | POA: Diagnosis not present

## 2021-11-05 DIAGNOSIS — J449 Chronic obstructive pulmonary disease, unspecified: Secondary | ICD-10-CM | POA: Diagnosis not present

## 2021-11-05 DIAGNOSIS — R296 Repeated falls: Secondary | ICD-10-CM | POA: Diagnosis not present

## 2021-11-05 DIAGNOSIS — M79604 Pain in right leg: Secondary | ICD-10-CM | POA: Diagnosis not present

## 2021-11-05 NOTE — Telephone Encounter (Signed)
Pt calling to follow up with the Oxygen Orders placed  on 10/30/2021.  Per [employer] [facility] pt will need to make a payment and place a card on file.    Pt states they will contact [employer].

## 2021-11-11 ENCOUNTER — Ambulatory Visit: Payer: Self-pay

## 2021-11-11 NOTE — Patient Outreach (Signed)
  Care Coordination   Follow Up Visit Note   11/11/2021 Name: GUYLA BLESS MRN: [id number] DOB: [birth year]  [name] is a 86 y.o. year old female who sees Evette Doffing, Mallie Mussel, MD for primary care. I spoke with  [name] by phone today.  What matters to the patients health and wellness today?  "I am doing pretty good, just want to keep it that way. No specific needs at this time.   Goals Addressed               This Visit's Progress     COMPLETED: Maintain quality of life (pt-stated)        Care Coordination Interventions: Assessed social determinant of health barriers             SDOH assessments and interventions completed:  Yes  SDOH Interventions Today    Flowsheet Row Most Recent Value  SDOH Interventions   Housing Interventions Intervention Not Indicated  Stress Interventions Intervention Not Indicated        Care Coordination Interventions Activated:  Yes  Care Coordination Interventions:  Yes, provided   Follow up plan: No further intervention required.   Encounter Outcome:  Pt. Visit Completed

## 2021-11-14 DIAGNOSIS — G4733 Obstructive sleep apnea (adult) (pediatric): Secondary | ICD-10-CM | POA: Diagnosis not present

## 2021-11-14 DIAGNOSIS — M79604 Pain in right leg: Secondary | ICD-10-CM | POA: Diagnosis not present

## 2021-11-14 DIAGNOSIS — R296 Repeated falls: Secondary | ICD-10-CM | POA: Diagnosis not present

## 2021-11-14 DIAGNOSIS — J449 Chronic obstructive pulmonary disease, unspecified: Secondary | ICD-10-CM | POA: Diagnosis not present

## 2021-12-04 ENCOUNTER — Telehealth: Payer: Self-pay | Admitting: *Deleted

## 2021-12-04 NOTE — Telephone Encounter (Signed)
Patient called in stating she just returned from [facility] and they gave her Rxs for chlorthalidone 25 mg and carvedilol 25 mg. Per OV notes on 9/14 and 9/28, these were both discontinued 2/2 dizziness. Patient was to take amlodipine 5 mg only for BP control. [facility] did not give her Rx for amlodipine. Will verify with PCP that patient should be taking amlodipine 5 mg only for BP control. Patient has only 2 tabs left. If this is accurate, this RN will call [facility] to d/c chlorthalidone 25 mg and carvedilol 25 and request they get amlodipine 5 mg ready for her.

## 2021-12-05 NOTE — Telephone Encounter (Signed)
Chlorthalidone and carvedilol both discontinued with Caitlyn Aguirre at [facility]. Caitlyn Aguirre will get Rx for amlodipine ready now. Patient notified and is very Patent attorney.

## 2021-12-06 DIAGNOSIS — R296 Repeated falls: Secondary | ICD-10-CM | POA: Diagnosis not present

## 2021-12-06 DIAGNOSIS — G4733 Obstructive sleep apnea (adult) (pediatric): Secondary | ICD-10-CM | POA: Diagnosis not present

## 2021-12-06 DIAGNOSIS — J449 Chronic obstructive pulmonary disease, unspecified: Secondary | ICD-10-CM | POA: Diagnosis not present

## 2021-12-06 DIAGNOSIS — M79604 Pain in right leg: Secondary | ICD-10-CM | POA: Diagnosis not present

## 2021-12-15 DIAGNOSIS — R296 Repeated falls: Secondary | ICD-10-CM | POA: Diagnosis not present

## 2021-12-15 DIAGNOSIS — M79604 Pain in right leg: Secondary | ICD-10-CM | POA: Diagnosis not present

## 2021-12-15 DIAGNOSIS — G4733 Obstructive sleep apnea (adult) (pediatric): Secondary | ICD-10-CM | POA: Diagnosis not present

## 2021-12-15 DIAGNOSIS — J449 Chronic obstructive pulmonary disease, unspecified: Secondary | ICD-10-CM | POA: Diagnosis not present

## 2022-01-05 DIAGNOSIS — M79604 Pain in right leg: Secondary | ICD-10-CM | POA: Diagnosis not present

## 2022-01-05 DIAGNOSIS — J449 Chronic obstructive pulmonary disease, unspecified: Secondary | ICD-10-CM | POA: Diagnosis not present

## 2022-01-05 DIAGNOSIS — R296 Repeated falls: Secondary | ICD-10-CM | POA: Diagnosis not present

## 2022-01-05 DIAGNOSIS — G4733 Obstructive sleep apnea (adult) (pediatric): Secondary | ICD-10-CM | POA: Diagnosis not present

## 2022-01-07 ENCOUNTER — Other Ambulatory Visit: Payer: Self-pay

## 2022-01-07 MED ORDER — AMLODIPINE BESYLATE 5 MG PO TABS: 5.0000 mg | ORAL_TABLET | Freq: Every day | ORAL | 3 refills | Status: DC

## 2022-01-14 DIAGNOSIS — R296 Repeated falls: Secondary | ICD-10-CM | POA: Diagnosis not present

## 2022-01-14 DIAGNOSIS — M79604 Pain in right leg: Secondary | ICD-10-CM | POA: Diagnosis not present

## 2022-01-14 DIAGNOSIS — G4733 Obstructive sleep apnea (adult) (pediatric): Secondary | ICD-10-CM | POA: Diagnosis not present

## 2022-01-14 DIAGNOSIS — J449 Chronic obstructive pulmonary disease, unspecified: Secondary | ICD-10-CM | POA: Diagnosis not present

## 2022-02-04 ENCOUNTER — Other Ambulatory Visit: Payer: Self-pay

## 2022-02-04 DIAGNOSIS — J438 Other emphysema: Secondary | ICD-10-CM

## 2022-02-04 MED ORDER — TIOTROPIUM BROMIDE MONOHYDRATE 18 MCG IN CAPS: 18.0000 ug | ORAL_CAPSULE | Freq: Every day | RESPIRATORY_TRACT | 3 refills | Status: DC

## 2022-02-04 MED ORDER — AMLODIPINE BESYLATE 5 MG PO TABS: 5.0000 mg | ORAL_TABLET | Freq: Every day | ORAL | 3 refills | Status: DC

## 2022-02-04 MED ORDER — LEVOTHYROXINE SODIUM 75 MCG PO TABS: 75.0000 ug | ORAL_TABLET | Freq: Every day | ORAL | 3 refills | Status: DC

## 2022-02-04 MED ORDER — METFORMIN HCL 1000 MG PO TABS: 1000.0000 mg | ORAL_TABLET | Freq: Every day | ORAL | 3 refills | Status: DC

## 2022-02-05 DIAGNOSIS — G4733 Obstructive sleep apnea (adult) (pediatric): Secondary | ICD-10-CM | POA: Diagnosis not present

## 2022-02-05 DIAGNOSIS — M79604 Pain in right leg: Secondary | ICD-10-CM | POA: Diagnosis not present

## 2022-02-05 DIAGNOSIS — R296 Repeated falls: Secondary | ICD-10-CM | POA: Diagnosis not present

## 2022-02-05 DIAGNOSIS — J449 Chronic obstructive pulmonary disease, unspecified: Secondary | ICD-10-CM | POA: Diagnosis not present

## 2022-02-09 ENCOUNTER — Other Ambulatory Visit: Payer: Self-pay

## 2022-02-09 DIAGNOSIS — J438 Other emphysema: Secondary | ICD-10-CM

## 2022-02-09 DIAGNOSIS — K219 Gastro-esophageal reflux disease without esophagitis: Secondary | ICD-10-CM

## 2022-02-09 DIAGNOSIS — R109 Unspecified abdominal pain: Secondary | ICD-10-CM

## 2022-02-09 MED ORDER — TIOTROPIUM BROMIDE MONOHYDRATE 18 MCG IN CAPS: 18.0000 ug | ORAL_CAPSULE | Freq: Every day | RESPIRATORY_TRACT | 3 refills | Status: DC

## 2022-02-09 MED ORDER — PAROXETINE HCL 30 MG PO TABS: 30.0000 mg | ORAL_TABLET | Freq: Every day | ORAL | 3 refills | Status: DC

## 2022-02-09 MED ORDER — AMLODIPINE BESYLATE 5 MG PO TABS: 5.0000 mg | ORAL_TABLET | Freq: Every day | ORAL | 3 refills | Status: DC

## 2022-02-09 MED ORDER — ATORVASTATIN CALCIUM 40 MG PO TABS: 40.0000 mg | ORAL_TABLET | Freq: Every day | ORAL | 3 refills | Status: DC

## 2022-02-09 MED ORDER — ALBUTEROL SULFATE HFA 108 (90 BASE) MCG/ACT IN AERS: 1.0000 | INHALATION_SPRAY | Freq: Four times a day (QID) | RESPIRATORY_TRACT | 5 refills | Status: DC | PRN

## 2022-02-09 MED ORDER — OMEPRAZOLE 20 MG PO CPDR: 20.0000 mg | DELAYED_RELEASE_CAPSULE | Freq: Every day | ORAL | 3 refills | Status: DC

## 2022-02-09 MED ORDER — METFORMIN HCL 1000 MG PO TABS: 1000.0000 mg | ORAL_TABLET | Freq: Every day | ORAL | 3 refills | Status: DC

## 2022-02-09 MED ORDER — LEVOTHYROXINE SODIUM 75 MCG PO TABS: 75.0000 ug | ORAL_TABLET | Freq: Every day | ORAL | 3 refills | Status: DC

## 2022-02-09 NOTE — Telephone Encounter (Signed)
Dr.Vincent please send rx to pharmacy attached to this note: [facility] ([place], [place] Phone: [phone]  Fax: [phone]

## 2022-02-14 DIAGNOSIS — R296 Repeated falls: Secondary | ICD-10-CM | POA: Diagnosis not present

## 2022-02-14 DIAGNOSIS — G4733 Obstructive sleep apnea (adult) (pediatric): Secondary | ICD-10-CM | POA: Diagnosis not present

## 2022-02-14 DIAGNOSIS — J449 Chronic obstructive pulmonary disease, unspecified: Secondary | ICD-10-CM | POA: Diagnosis not present

## 2022-02-14 DIAGNOSIS — M79604 Pain in right leg: Secondary | ICD-10-CM | POA: Diagnosis not present

## 2022-03-08 DIAGNOSIS — R296 Repeated falls: Secondary | ICD-10-CM | POA: Diagnosis not present

## 2022-03-08 DIAGNOSIS — G4733 Obstructive sleep apnea (adult) (pediatric): Secondary | ICD-10-CM | POA: Diagnosis not present

## 2022-03-08 DIAGNOSIS — J449 Chronic obstructive pulmonary disease, unspecified: Secondary | ICD-10-CM | POA: Diagnosis not present

## 2022-03-08 DIAGNOSIS — M79604 Pain in right leg: Secondary | ICD-10-CM | POA: Diagnosis not present

## 2022-03-17 DIAGNOSIS — M79604 Pain in right leg: Secondary | ICD-10-CM | POA: Diagnosis not present

## 2022-03-17 DIAGNOSIS — G4733 Obstructive sleep apnea (adult) (pediatric): Secondary | ICD-10-CM | POA: Diagnosis not present

## 2022-03-17 DIAGNOSIS — J449 Chronic obstructive pulmonary disease, unspecified: Secondary | ICD-10-CM | POA: Diagnosis not present

## 2022-03-17 DIAGNOSIS — R296 Repeated falls: Secondary | ICD-10-CM | POA: Diagnosis not present

## 2022-04-06 DIAGNOSIS — M79604 Pain in right leg: Secondary | ICD-10-CM | POA: Diagnosis not present

## 2022-04-06 DIAGNOSIS — G4733 Obstructive sleep apnea (adult) (pediatric): Secondary | ICD-10-CM | POA: Diagnosis not present

## 2022-04-06 DIAGNOSIS — J449 Chronic obstructive pulmonary disease, unspecified: Secondary | ICD-10-CM | POA: Diagnosis not present

## 2022-04-06 DIAGNOSIS — R296 Repeated falls: Secondary | ICD-10-CM | POA: Diagnosis not present

## 2022-04-15 DIAGNOSIS — M79604 Pain in right leg: Secondary | ICD-10-CM | POA: Diagnosis not present

## 2022-04-15 DIAGNOSIS — G4733 Obstructive sleep apnea (adult) (pediatric): Secondary | ICD-10-CM | POA: Diagnosis not present

## 2022-04-15 DIAGNOSIS — J449 Chronic obstructive pulmonary disease, unspecified: Secondary | ICD-10-CM | POA: Diagnosis not present

## 2022-04-15 DIAGNOSIS — R296 Repeated falls: Secondary | ICD-10-CM | POA: Diagnosis not present

## 2022-04-21 DIAGNOSIS — E119 Type 2 diabetes mellitus without complications: Secondary | ICD-10-CM | POA: Diagnosis not present

## 2022-04-21 DIAGNOSIS — H52223 Regular astigmatism, bilateral: Secondary | ICD-10-CM | POA: Diagnosis not present

## 2022-04-21 DIAGNOSIS — H5211 Myopia, right eye: Secondary | ICD-10-CM | POA: Diagnosis not present

## 2022-04-21 DIAGNOSIS — H35363 Drusen (degenerative) of macula, bilateral: Secondary | ICD-10-CM | POA: Diagnosis not present

## 2022-04-21 LAB — HM DIABETES EYE EXAM

## 2022-05-07 DIAGNOSIS — M79604 Pain in right leg: Secondary | ICD-10-CM | POA: Diagnosis not present

## 2022-05-07 DIAGNOSIS — G4733 Obstructive sleep apnea (adult) (pediatric): Secondary | ICD-10-CM | POA: Diagnosis not present

## 2022-05-07 DIAGNOSIS — R296 Repeated falls: Secondary | ICD-10-CM | POA: Diagnosis not present

## 2022-05-07 DIAGNOSIS — J449 Chronic obstructive pulmonary disease, unspecified: Secondary | ICD-10-CM | POA: Diagnosis not present

## 2022-05-16 DIAGNOSIS — R296 Repeated falls: Secondary | ICD-10-CM | POA: Diagnosis not present

## 2022-05-16 DIAGNOSIS — G4733 Obstructive sleep apnea (adult) (pediatric): Secondary | ICD-10-CM | POA: Diagnosis not present

## 2022-05-16 DIAGNOSIS — J449 Chronic obstructive pulmonary disease, unspecified: Secondary | ICD-10-CM | POA: Diagnosis not present

## 2022-05-16 DIAGNOSIS — M79604 Pain in right leg: Secondary | ICD-10-CM | POA: Diagnosis not present

## 2022-05-18 ENCOUNTER — Encounter: Payer: Self-pay | Admitting: Dietician

## 2022-06-06 DIAGNOSIS — J449 Chronic obstructive pulmonary disease, unspecified: Secondary | ICD-10-CM | POA: Diagnosis not present

## 2022-06-06 DIAGNOSIS — M79604 Pain in right leg: Secondary | ICD-10-CM | POA: Diagnosis not present

## 2022-06-06 DIAGNOSIS — R296 Repeated falls: Secondary | ICD-10-CM | POA: Diagnosis not present

## 2022-06-06 DIAGNOSIS — G4733 Obstructive sleep apnea (adult) (pediatric): Secondary | ICD-10-CM | POA: Diagnosis not present

## 2022-06-15 DIAGNOSIS — R296 Repeated falls: Secondary | ICD-10-CM | POA: Diagnosis not present

## 2022-06-15 DIAGNOSIS — M79604 Pain in right leg: Secondary | ICD-10-CM | POA: Diagnosis not present

## 2022-06-15 DIAGNOSIS — J449 Chronic obstructive pulmonary disease, unspecified: Secondary | ICD-10-CM | POA: Diagnosis not present

## 2022-06-15 DIAGNOSIS — G4733 Obstructive sleep apnea (adult) (pediatric): Secondary | ICD-10-CM | POA: Diagnosis not present

## 2022-07-07 DIAGNOSIS — G4733 Obstructive sleep apnea (adult) (pediatric): Secondary | ICD-10-CM | POA: Diagnosis not present

## 2022-07-07 DIAGNOSIS — J449 Chronic obstructive pulmonary disease, unspecified: Secondary | ICD-10-CM | POA: Diagnosis not present

## 2022-07-07 DIAGNOSIS — M79604 Pain in right leg: Secondary | ICD-10-CM | POA: Diagnosis not present

## 2022-07-07 DIAGNOSIS — R296 Repeated falls: Secondary | ICD-10-CM | POA: Diagnosis not present

## 2022-07-16 DIAGNOSIS — R296 Repeated falls: Secondary | ICD-10-CM | POA: Diagnosis not present

## 2022-07-16 DIAGNOSIS — G4733 Obstructive sleep apnea (adult) (pediatric): Secondary | ICD-10-CM | POA: Diagnosis not present

## 2022-07-16 DIAGNOSIS — M79604 Pain in right leg: Secondary | ICD-10-CM | POA: Diagnosis not present

## 2022-07-16 DIAGNOSIS — J449 Chronic obstructive pulmonary disease, unspecified: Secondary | ICD-10-CM | POA: Diagnosis not present

## 2022-07-27 ENCOUNTER — Encounter: Payer: Self-pay | Admitting: Student in an Organized Health Care Education/Training Program

## 2022-07-27 ENCOUNTER — Ambulatory Visit (INDEPENDENT_AMBULATORY_CARE_PROVIDER_SITE_OTHER): Payer: 59 | Admitting: Student in an Organized Health Care Education/Training Program

## 2022-07-27 VITALS — BP 151/80 | HR 99 | Temp 98.2°F | Ht 68.0 in | Wt 188.2 lb

## 2022-07-27 DIAGNOSIS — Z7984 Long term (current) use of oral hypoglycemic drugs: Secondary | ICD-10-CM

## 2022-07-27 DIAGNOSIS — H9193 Unspecified hearing loss, bilateral: Secondary | ICD-10-CM

## 2022-07-27 DIAGNOSIS — E039 Hypothyroidism, unspecified: Secondary | ICD-10-CM | POA: Diagnosis not present

## 2022-07-27 DIAGNOSIS — I10 Essential (primary) hypertension: Secondary | ICD-10-CM | POA: Diagnosis not present

## 2022-07-27 DIAGNOSIS — J849 Interstitial pulmonary disease, unspecified: Secondary | ICD-10-CM

## 2022-07-27 DIAGNOSIS — E1169 Type 2 diabetes mellitus with other specified complication: Secondary | ICD-10-CM | POA: Diagnosis not present

## 2022-07-27 DIAGNOSIS — F32A Depression, unspecified: Secondary | ICD-10-CM | POA: Diagnosis not present

## 2022-07-27 DIAGNOSIS — M159 Polyosteoarthritis, unspecified: Secondary | ICD-10-CM

## 2022-07-27 DIAGNOSIS — J449 Chronic obstructive pulmonary disease, unspecified: Secondary | ICD-10-CM | POA: Diagnosis not present

## 2022-07-27 DIAGNOSIS — H9190 Unspecified hearing loss, unspecified ear: Secondary | ICD-10-CM | POA: Insufficient documentation

## 2022-07-27 LAB — GLUCOSE, CAPILLARY: Glucose-Capillary: 134 mg/dL — ABNORMAL HIGH (ref 70–99)

## 2022-07-27 LAB — POCT GLYCOSYLATED HEMOGLOBIN (HGB A1C): Hemoglobin A1C: 6.6 % — AB (ref 4.0–5.6)

## 2022-07-27 NOTE — Assessment & Plan Note (Signed)
Symptomatically stable.  Has done pretty poorly since COVID infection and hospitalization in 2023.  Did not recover to her previous functional state.  Complicated by likely comorbid interstitial lung disease.  I clarified some confusion about Spiriva, she was not using this every day.  Has not had an exacerbation recently, so I am not sure there is benefit to adding LABA at this point.  Will continue with LAMA and as needed [place].  Referring to pulmonology today for help with managing comorbid ILD.

## 2022-07-27 NOTE — Assessment & Plan Note (Signed)
Hearing loss in both ears starting to impact quality of life.  Left ear is clear with healthy appearing TM, the right ear has old wax appearing substance right next to the TM.  Too deep for our irrigation.  Will refer to ENT for audiology and hearing aide fit.

## 2022-07-27 NOTE — Assessment & Plan Note (Signed)
Good control diabetes.  A1c 6.6%.  Plan to continue with metformin 1000 mg daily.  Atorvastatin for primary prevention of ischemic disease.  Will check urine microalbumin today.  Foot exam was normal today with no signs of neuropathy.  Will check BMP today.

## 2022-07-27 NOTE — Assessment & Plan Note (Signed)
Blood pressure not well-controlled today at 151/88.  Currently only taking amlodipine 5 mg daily.  Had issues with hyperkalemia due to ACE inhibitor's in the past.  Had orthostatic hypotension with thiazide in the past.  I think currently were at the most antihypertensive that she can tolerate.  Will continue with amlodipine, follow-up with me in 3 months.  If blood pressure still elevated may try to reinitiate either a low-dose thiazide or low-dose ACE inhibitor with very careful follow-up.  Will check BMP today.

## 2022-07-27 NOTE — Assessment & Plan Note (Signed)
Chronic interstitial lung disease changes first noted on imaging dating back to 2019.  Patient has not had follow-up of this so far.  Superimposed is COPD for which she uses Spiriva daily.  Currently requires supplemental oxygen for all exertion and at nighttime.  Exam today has diffuse fine crackles at the bilateral bases.  Etiology of the ILD could be related to tobacco smoke exposure, she has a history of polymyalgia rheumatica and so could be rheumatologic related.  Autoantibody workup last year was negative.  Symptoms seem to be stable, has pretty low functional status at home though it seems to be acceptable to her at this point.  Has a lot of support from her family.  At this point I am going to order a high-resolution CT scan of the chest and refer to pulmonology to consider further medical management of the likely ILD.  Not sure if she would be a candidate for antifibrotic therapy, likely will need to further rule out rheumatologic disease.

## 2022-07-27 NOTE — Assessment & Plan Note (Signed)
Depressed mood and sleep symptoms are stable today.  Will continue with paroxetine 30 mg daily.  Talked with her daughter about increased socialization.  Good to treat her hearing difficulty by sending for hearing aids which hopefully will also help her mood.

## 2022-07-27 NOTE — Assessment & Plan Note (Signed)
Symptomatic osteoarthritis in bilateral knees.  She also has rotator cuff disease of the left shoulder.  We talked about safe use of Advil and Aleve, she is using this too much currently.  Not a good candidate for surgical interventions given her underlying lung disease.  We talked about setting reasonable expectations.  May try transitioning paroxetine to Cymbalta in the future to help with mood disorder and chronic pain component.

## 2022-07-27 NOTE — Assessment & Plan Note (Signed)
No symptoms of hypo or hyperthyroidism.  Plan to check TSH today.  Continue with Synthroid 75 mcg daily for now.

## 2022-07-27 NOTE — Progress Notes (Signed)
Established Patient Office Visit  Subjective   Patient ID: CHERAY PARDI, female    DOB: 1935/08/02  Age: 87 y.o. MRN: [id number]  Chief Complaint  Patient presents with   Insomnia   Diabetes    HPI  87 year old person here for management of diabetes, hypertension, and COPD.  It has been almost a year since my last visit with her, which is too long.  She last came for acute visits last September.  Had a hospitalization in 2023 for COVID-19 with prolonged recovery.  Companied by her daughter today.  She has no acute concerns today.  She is living at home with her daughter.  Needs help with most activities of daily living.  She can only walk about 200 feet before she has to stop to rest due to dyspnea.  Denies chest discomfort with exertion.  No recent exacerbations, no productive cough, no fevers.  No hospitalizations since early 2023.  She reports intermittent use of Spiriva, not aware she was to use it every day.  Uses albuterol inhaler a few times a week to help with dyspnea.  Reports good adherence with her other medications without adverse side effects.  No falls at home.  Uses a cane as needed.    Objective:     BP (!) 151/80 (BP Location: Left Arm, Patient Position: Sitting, Cuff Size: Small)   Pulse 99   Temp 98.2 F (36.8 C) (Oral)   Ht 5\' 8"  (1.727 m)   Wt 188 lb 3.2 oz (85.4 kg)   SpO2 95%   BMI 28.62 kg/m    Physical Exam  General: Frail-appearing older woman, no distress Neck: Normal thyroid, no lymphadenopathy Ears: Left tympanic membrane is normal-appearing, right tympanic membrane obscured by cerumen Heart: Tachycardic and rhythm with no murmurs Lungs: Loud fine crackles heard at the bilateral bases, no wheezing, mildly tachypneic with talking Extremities: 1+ pitting edema in both lower extremities Psych: Appropriate mood and affect, not depressed or anxious appearing Neuro: Diminished hearing in both ears, conversational, full strength in the upper and lower  extremities, wide-based slow and unstable gait    Assessment & Plan:   Problem List Items Addressed This Visit       High   Type 2 diabetes mellitus with other specified complication (HCC) - Primary (Chronic)    Good control diabetes.  A1c 6.6%.  Plan to continue with metformin 1000 mg daily.  Atorvastatin for primary prevention of ischemic disease.  Will check urine microalbumin today.  Foot exam was normal today with no signs of neuropathy.  Will check BMP today.      Relevant Orders   POC Hbg A1C (Completed)   Glucose, capillary (Completed)   Lipid Profile   BMP8+Anion Gap   Microalbumin / Creatinine Urine Ratio   Depression (Chronic)    Depressed mood and sleep symptoms are stable today.  Will continue with paroxetine 30 mg daily.  Talked with her daughter about increased socialization.  Good to treat her hearing difficulty by sending for hearing aids which hopefully will also help her mood.      Essential hypertension (Chronic)    Blood pressure not well-controlled today at 151/88.  Currently only taking amlodipine 5 mg daily.  Had issues with hyperkalemia due to ACE inhibitor's in the past.  Had orthostatic hypotension with thiazide in the past.  I think currently were at the most antihypertensive that she can tolerate.  Will continue with amlodipine, follow-up with me in 85 months.  If  blood pressure still elevated may try to reinitiate either a low-dose thiazide or low-dose ACE inhibitor with very careful follow-up.  Will check BMP today.      ILD (interstitial lung disease) (HCC) (Chronic)    Chronic interstitial lung disease changes first noted on imaging dating back to 2019.  Patient has not had follow-up of this so far.  Superimposed is COPD for which she uses Spiriva daily.  Currently requires supplemental oxygen for all exertion and at nighttime.  Exam today has diffuse fine crackles at the bilateral bases.  Etiology of the ILD could be related to tobacco smoke exposure, she  has a history of polymyalgia rheumatica and so could be rheumatologic related.  Autoantibody workup last year was negative.  Symptoms seem to be stable, has pretty low functional status at home though it seems to be acceptable to her at this point.  Has a lot of support from her family.  At this point I am going to order a high-resolution CT scan of the chest and refer to pulmonology to consider further medical management of the likely ILD.  Not sure if she would be a candidate for antifibrotic therapy, likely will need to further rule out rheumatologic disease.      Relevant Orders   Ambulatory referral to Pulmonology   CT Chest High Resolution     Medium    Hypothyroidism (Chronic)    No symptoms of hypo or hyperthyroidism.  Plan to check TSH today.  Continue with Synthroid 75 mcg daily for now.      Relevant Orders   TSH   COPD (HCC) (Chronic)    Symptomatically stable.  Has done pretty poorly since COVID infection and hospitalization in 2023.  Did not recover to her previous functional state.  Complicated by likely comorbid interstitial lung disease.  I clarified some confusion about Spiriva, she was not using this every day.  Has not had an exacerbation recently, so I am not sure there is benefit to adding LABA at this point.  Will continue with LAMA and as needed [place].  Referring to pulmonology today for help with managing comorbid ILD.      Osteoarthritis (Chronic)    Symptomatic osteoarthritis in bilateral knees.  She also has rotator cuff disease of the left shoulder.  We talked about safe use of Advil and Aleve, she is using this too much currently.  Not a good candidate for surgical interventions given her underlying lung disease.  We talked about setting reasonable expectations.  May try transitioning paroxetine to Cymbalta in the future to help with mood disorder and chronic pain component.        Unprioritized   Hearing loss    Hearing loss in both ears starting to impact  quality of life.  Left ear is clear with healthy appearing TM, the right ear has old wax appearing substance right next to the TM.  Too deep for our irrigation.  Will refer to ENT for audiology and hearing aide fit.       Relevant Orders   Ambulatory referral to ENT    Return in about 3 months (around 10/27/2022).    [name], MD

## 2022-07-28 ENCOUNTER — Encounter: Payer: Self-pay | Admitting: Student in an Organized Health Care Education/Training Program

## 2022-07-28 LAB — LIPID PANEL
Chol/HDL Ratio: 2.4 ratio (ref 0.0–4.4)
Cholesterol, Total: 140 mg/dL (ref 100–199)
HDL: 59 mg/dL (ref >39)
LDL Chol Calc (NIH): 65 mg/dL (ref 0–99)
Triglycerides: 81 mg/dL (ref 0–149)
VLDL Cholesterol Cal: 16 mg/dL (ref 5–40)

## 2022-07-28 LAB — MICROALBUMIN / CREATININE URINE RATIO
Creatinine, Urine: 149.5 mg/dL
Microalb/Creat Ratio: 23 mg/g creat (ref 0–29)
Microalbumin, Urine: 34 ug/mL

## 2022-07-28 LAB — BMP8+ANION GAP
Anion Gap: 14 mmol/L (ref 10.0–18.0)
BUN/Creatinine Ratio: 9 — ABNORMAL LOW (ref 12–28)
BUN: 11 mg/dL (ref 8–27)
CO2: 18 mmol/L — ABNORMAL LOW (ref 20–29)
Calcium: 10.1 mg/dL (ref 8.7–10.3)
Chloride: 109 mmol/L — ABNORMAL HIGH (ref 96–106)
Creatinine, Ser: 1.24 mg/dL — ABNORMAL HIGH (ref 0.57–1.00)
Glucose: 121 mg/dL — ABNORMAL HIGH (ref 70–99)
Potassium: 4.5 mmol/L (ref 3.5–5.2)
Sodium: 141 mmol/L (ref 134–144)
eGFR: 43 mL/min/{1.73_m2} — ABNORMAL LOW (ref >59)

## 2022-07-28 LAB — TSH: TSH: 3.15 u[IU]/mL (ref 0.450–4.500)

## 2022-08-06 DIAGNOSIS — R296 Repeated falls: Secondary | ICD-10-CM | POA: Diagnosis not present

## 2022-08-06 DIAGNOSIS — J449 Chronic obstructive pulmonary disease, unspecified: Secondary | ICD-10-CM | POA: Diagnosis not present

## 2022-08-06 DIAGNOSIS — M79604 Pain in right leg: Secondary | ICD-10-CM | POA: Diagnosis not present

## 2022-08-06 DIAGNOSIS — G4733 Obstructive sleep apnea (adult) (pediatric): Secondary | ICD-10-CM | POA: Diagnosis not present

## 2022-08-13 ENCOUNTER — Ambulatory Visit (HOSPITAL_COMMUNITY)
Admission: RE | Admit: 2022-08-13 | Discharge: 2022-08-13 | Disposition: A | Discharge status: Home / Self Care | Payer: 59 | Source: Ambulatory Visit | Attending: Student in an Organized Health Care Education/Training Program | Admitting: Student in an Organized Health Care Education/Training Program

## 2022-08-13 DIAGNOSIS — J432 Centrilobular emphysema: Secondary | ICD-10-CM | POA: Diagnosis not present

## 2022-08-13 DIAGNOSIS — J8410 Pulmonary fibrosis, unspecified: Secondary | ICD-10-CM | POA: Diagnosis not present

## 2022-08-13 DIAGNOSIS — J849 Interstitial pulmonary disease, unspecified: Secondary | ICD-10-CM | POA: Diagnosis not present

## 2022-08-13 DIAGNOSIS — R918 Other nonspecific abnormal finding of lung field: Secondary | ICD-10-CM | POA: Diagnosis not present

## 2022-08-15 DIAGNOSIS — G4733 Obstructive sleep apnea (adult) (pediatric): Secondary | ICD-10-CM | POA: Diagnosis not present

## 2022-08-15 DIAGNOSIS — M79604 Pain in right leg: Secondary | ICD-10-CM | POA: Diagnosis not present

## 2022-08-15 DIAGNOSIS — J449 Chronic obstructive pulmonary disease, unspecified: Secondary | ICD-10-CM | POA: Diagnosis not present

## 2022-08-15 DIAGNOSIS — R296 Repeated falls: Secondary | ICD-10-CM | POA: Diagnosis not present

## 2022-09-06 DIAGNOSIS — J449 Chronic obstructive pulmonary disease, unspecified: Secondary | ICD-10-CM | POA: Diagnosis not present

## 2022-09-06 DIAGNOSIS — G4733 Obstructive sleep apnea (adult) (pediatric): Secondary | ICD-10-CM | POA: Diagnosis not present

## 2022-09-06 DIAGNOSIS — R296 Repeated falls: Secondary | ICD-10-CM | POA: Diagnosis not present

## 2022-09-06 DIAGNOSIS — M79604 Pain in right leg: Secondary | ICD-10-CM | POA: Diagnosis not present

## 2022-09-09 ENCOUNTER — Telehealth: Payer: Self-pay

## 2022-09-09 NOTE — Telephone Encounter (Signed)
I called pt again - no answer; call went to vm again

## 2022-09-09 NOTE — Telephone Encounter (Signed)
Requesting to speak with a nurse about losing weight, off balanced in the morning and not feeling well. Please call pt back.

## 2022-09-09 NOTE — Telephone Encounter (Signed)
Return pt's call - no answer; left message of my return call and to call the office back.

## 2022-09-10 ENCOUNTER — Institutional Professional Consult (permissible substitution) (INDEPENDENT_AMBULATORY_CARE_PROVIDER_SITE_OTHER): Payer: 59 | Admitting: Otolaryngology

## 2022-09-14 NOTE — Telephone Encounter (Signed)
Pt stated she's feeling "pretty good" right now and she prefers to see her doctor; so, she will wait. She was informed to call the office back if her condition changes.

## 2022-09-14 NOTE — Telephone Encounter (Signed)
Pt stated she missed our telephone calls.Stated she's feeling better but still concern about weight loss. Next appt is 8/26 with PCP.

## 2022-09-14 NOTE — Telephone Encounter (Signed)
Ok. I can look into her weight loss at our next visit. Or if she would like to be seen sooner for that we can work her into a resident clinic.

## 2022-09-15 DIAGNOSIS — R296 Repeated falls: Secondary | ICD-10-CM | POA: Diagnosis not present

## 2022-09-15 DIAGNOSIS — J449 Chronic obstructive pulmonary disease, unspecified: Secondary | ICD-10-CM | POA: Diagnosis not present

## 2022-09-15 DIAGNOSIS — G4733 Obstructive sleep apnea (adult) (pediatric): Secondary | ICD-10-CM | POA: Diagnosis not present

## 2022-09-15 DIAGNOSIS — M79604 Pain in right leg: Secondary | ICD-10-CM | POA: Diagnosis not present

## 2022-09-23 ENCOUNTER — Ambulatory Visit: Payer: 59

## 2022-09-23 VITALS — Ht 68.0 in | Wt 188.0 lb

## 2022-09-23 DIAGNOSIS — Z0000 Encounter for general adult medical examination without abnormal findings: Secondary | ICD-10-CM | POA: Diagnosis not present

## 2022-09-23 NOTE — Patient Instructions (Signed)
Caitlyn Aguirre , Thank you for taking time to come for your Medicare Wellness Visit. I appreciate your ongoing commitment to your health goals. Please review the following plan we discussed and let me know if I can assist you in the future.   Referrals/Orders/Follow-Ups/Clinician Recommendations: You are due for a Tetanus, Flu, Covid and a Shingles vaccine.  You can get these done at your local pharmacy.  It was nice to talk with you today.  Each day, aim for 6 glasses of water, plenty of protein in your diet and try to get up and walk/ stretch every hour for 5-10 minutes at a time.    This is a list of the screening recommended for you and due dates:  Health Maintenance  Topic Date Due   Zoster (Shingles) Vaccine (1 of 2) Never done   DTaP/Tdap/Td vaccine (2 - Td or Tdap) 11/19/2020   COVID-19 Vaccine (1 - 2023-24 season) Never done   Flu Shot  09/03/2022   Hemoglobin A1C  10/27/2022   Eye exam for diabetics  04/21/2023   Yearly kidney function blood test for diabetes  07/27/2023   Yearly kidney health urinalysis for diabetes  07/27/2023   Complete foot exam   07/27/2023   Medicare Annual Wellness Visit  09/23/2023   Pneumonia Vaccine  Completed   DEXA scan (bone density measurement)  Completed   HPV Vaccine  Aged Out    Advanced directives: (Copy Requested) Please bring a copy of your health care power of attorney and living will to the office to be added to your chart at your convenience.  Next Medicare Annual Wellness Visit scheduled for next year: Yes
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
## 2022-09-28 ENCOUNTER — Encounter: Payer: Self-pay | Admitting: Student in an Organized Health Care Education/Training Program

## 2022-09-28 ENCOUNTER — Ambulatory Visit (INDEPENDENT_AMBULATORY_CARE_PROVIDER_SITE_OTHER): Payer: 59 | Admitting: Student in an Organized Health Care Education/Training Program

## 2022-09-28 VITALS — BP 136/61 | HR 94 | Temp 98.1°F | Ht 68.0 in | Wt 182.9 lb

## 2022-09-28 DIAGNOSIS — F32A Depression, unspecified: Secondary | ICD-10-CM

## 2022-09-28 DIAGNOSIS — J849 Interstitial pulmonary disease, unspecified: Secondary | ICD-10-CM

## 2022-09-28 MED ORDER — MIRTAZAPINE 7.5 MG PO TABS: 7.5000 mg | ORAL_TABLET | Freq: Every day | ORAL | 1 refills | Status: DC

## 2022-09-28 NOTE — Progress Notes (Signed)
Established Patient Office Visit  Subjective   Patient ID: Caitlyn Aguirre, female    DOB: 1935/11/21  Age: 87 y.o. MRN: [id number]  Chief Complaint  Patient presents with   Medication Refill   Follow-up    HPI  87 year old person here for follow-up of depression.  Accompanied by family member today.  Struggling at home since I last saw her 2 months ago.  Still has significantly depressed mood, intrusive thoughts about her grandson's death on a daily basis.  Low appetite, decreased oral intake, and reports some moderate weight loss.  No falls at home.  No other changes in her medications recently.  She is frustrated by her constant dyspnea with exertion.  Struggling to do activities at home and outside the house.  No fevers or chills.  No recent illness.  No ED visits or urgent care visits.  Uses supplemental oxygen with any activity and CPAP at night.  Good adherence with Spiriva and other medications.  Denies any abdominal pain, chest discomfort, productive cough.    Objective:     BP 136/61 (BP Location: Right Arm, Patient Position: Sitting, Cuff Size: Small)   Pulse 94   Temp 98.1 F (36.7 C) (Oral)   Ht 5\' 8"  (1.727 m)   Wt 182 lb 14.4 oz (83 kg)   SpO2 93%   BMI 27.81 kg/m    Physical Exam  Gen: Well-appearing woman, no distress Neck: Normal thyroid, no lymphadenopathy, exaggerated carotid pulse, no JVD CV: Regular rate and rhythm, distant heart sounds, 2 out of 6 holosystolic murmur at the left lower sternal border Lungs: Clear to auscultation, normal work of breathing, no crackles on exam today Ext: Warm and well-perfused with no lower extremity edema Neuro: Alert, conversational, full strength in the upper and lower extremities, mildly delayed get up and go, mildly wide-based gait with some unsteadiness. Psych: Mildly depressed appearing, not anxious appearing, very pleasant affect    Assessment & Plan:   Problem List Items Addressed This Visit       High    Depression (Chronic)    Still having some significantly depressed mood, thinks about the death of her grandson on a daily basis.  She has been grieving about this for about 2 years now.  Has decreased appetite and some moderate weight loss over the past 6 months.  Good adherence with paroxetine.  Having some issues with sleep as well.  I am going to try adding low-dose mirtazapine 7.5 mg daily at bedtime which might help with appetite and mood.  I offered her counseling services which she declines for now.  Follow-up with me in 3 months.      Relevant Medications   mirtazapine (REMERON) 7.5 MG tablet   ILD (interstitial lung disease) (HCC) - Primary (Chronic)    Probably UIP based on high-resolution CT in July.  This is stable compared to imaging from 2023.  Symptomatically she has significant dyspnea with exertion, can only walk about 200 feet.  Struggles to get up the 6 steps on her front porch.  Uses supplemental oxygen for all activity.  Has some signs of pulmonary artery hypertension on echocardiogram and dilated pulmonary trunk on imaging.  Also has emphysema on imaging and history of COPD, some dyspnea may be multifactorial.  She is on tiotropium right now.  Has a consultation scheduled for next week with pulmonology to consider further treatment of this ILD.  She is struggling with functioning and some activities of daily living.  Has  a manual wheelchair but lacks the pulmonary conditioning to be able to push herself.  At this point I think she would have improved functioning, and independence, with an Art gallery manager.  This may also reduce her fall risk.       Relevant Orders   Ambulatory Referral for DME    Return in about 3 months (around 12/29/2022).    [name], MD

## 2022-09-28 NOTE — Assessment & Plan Note (Signed)
Still having some significantly depressed mood, thinks about the death of her grandson on a daily basis.  She has been grieving about this for about 2 years now.  Has decreased appetite and some moderate weight loss over the past 6 months.  Good adherence with paroxetine.  Having some issues with sleep as well.  I am going to try adding low-dose mirtazapine 7.5 mg daily at bedtime which might help with appetite and mood.  I offered her counseling services which she declines for now.  Follow-up with me in 3 months.

## 2022-09-28 NOTE — Assessment & Plan Note (Signed)
Probably UIP based on high-resolution CT in July.  This is stable compared to imaging from 2023.  Symptomatically she has significant dyspnea with exertion, can only walk about 200 feet.  Struggles to get up the 6 steps on her front porch.  Uses supplemental oxygen for all activity.  Has some signs of pulmonary artery hypertension on echocardiogram and dilated pulmonary trunk on imaging.  Also has emphysema on imaging and history of COPD, some dyspnea may be multifactorial.  She is on tiotropium right now.  Has a consultation scheduled for next week with pulmonology to consider further treatment of this ILD.  She is struggling with functioning and some activities of daily living.  Has a manual wheelchair but lacks the pulmonary conditioning to be able to push herself.  At this point I think she would have improved functioning, and independence, with an Art gallery manager.  This may also reduce her fall risk.

## 2022-10-06 ENCOUNTER — Other Ambulatory Visit (INDEPENDENT_AMBULATORY_CARE_PROVIDER_SITE_OTHER): Payer: 59

## 2022-10-06 ENCOUNTER — Ambulatory Visit (INDEPENDENT_AMBULATORY_CARE_PROVIDER_SITE_OTHER): Payer: 59 | Admitting: Internal Medicine

## 2022-10-06 ENCOUNTER — Encounter: Payer: Self-pay | Admitting: Internal Medicine

## 2022-10-06 VITALS — BP 122/60 | HR 48 | Ht 68.0 in | Wt 184.0 lb

## 2022-10-06 DIAGNOSIS — I288 Other diseases of pulmonary vessels: Secondary | ICD-10-CM

## 2022-10-06 DIAGNOSIS — R600 Localized edema: Secondary | ICD-10-CM | POA: Diagnosis not present

## 2022-10-06 DIAGNOSIS — R0902 Hypoxemia: Secondary | ICD-10-CM

## 2022-10-06 DIAGNOSIS — J439 Emphysema, unspecified: Secondary | ICD-10-CM

## 2022-10-06 DIAGNOSIS — I2510 Atherosclerotic heart disease of native coronary artery without angina pectoris: Secondary | ICD-10-CM

## 2022-10-06 DIAGNOSIS — G4734 Idiopathic sleep related nonobstructive alveolar hypoventilation: Secondary | ICD-10-CM | POA: Diagnosis not present

## 2022-10-06 DIAGNOSIS — R0609 Other forms of dyspnea: Secondary | ICD-10-CM

## 2022-10-06 DIAGNOSIS — J84112 Idiopathic pulmonary fibrosis: Secondary | ICD-10-CM

## 2022-10-06 LAB — CBC WITH DIFFERENTIAL/PLATELET
Basophils Absolute: 0.1 10*3/uL (ref 0.0–0.1)
Basophils Relative: 0.6 % (ref 0.0–3.0)
Eosinophils Absolute: 0.3 10*3/uL (ref 0.0–0.7)
Eosinophils Relative: 3.2 % (ref 0.0–5.0)
HCT: 30.9 % — ABNORMAL LOW (ref 36.0–46.0)
Hemoglobin: 9.4 g/dL — ABNORMAL LOW (ref 12.0–15.0)
Lymphocytes Relative: 13.7 % (ref 12.0–46.0)
Lymphs Abs: 1.2 10*3/uL (ref 0.7–4.0)
MCHC: 30.3 g/dL (ref 30.0–36.0)
MCV: 76 fl — ABNORMAL LOW (ref 78.0–100.0)
Monocytes Absolute: 0.5 10*3/uL (ref 0.1–1.0)
Monocytes Relative: 6.3 % (ref 3.0–12.0)
Neutro Abs: 6.5 10*3/uL (ref 1.4–7.7)
Neutrophils Relative %: 76.2 % (ref 43.0–77.0)
Platelets: 244 10*3/uL (ref 150.0–400.0)
RBC: 4.07 Mil/uL (ref 3.87–5.11)
RDW: 20.4 % — ABNORMAL HIGH (ref 11.5–15.5)
WBC: 8.5 10*3/uL (ref 4.0–10.5)

## 2022-10-06 LAB — HEPATIC FUNCTION PANEL
ALT: 9 U/L (ref 0–35)
AST: 16 U/L (ref 0–37)
Albumin: 4 g/dL (ref 3.5–5.2)
Alkaline Phosphatase: 100 U/L (ref 39–117)
Bilirubin, Direct: 0.2 mg/dL (ref 0.0–0.3)
Total Bilirubin: 0.8 mg/dL (ref 0.2–1.2)
Total Protein: 7.6 g/dL (ref 6.0–8.3)

## 2022-10-06 NOTE — Addendum Note (Signed)
Addended by: Hedda Slade on: 10/06/2022 12:15 PM   Modules accepted: Orders

## 2022-10-06 NOTE — Patient Instructions (Signed)
IPF (idiopathic pulmonary fibrosis) (HCC) Associated frailty  -New diagnosis based on age greater than 42, CT scan that shows progression along with near classic features of UIP and also negative autoimmune test in April 2023  Plan -Do spirometry and DLCO first available -Do blood work for CBC, chemistry, liver function test, BNP and QuantiFERON gold - Meet with nurse practitioner to go over treatment choices of nintedanib or pirfenidone  -This will be a dedicated 30 minutes to discuss the treatment choices  -If you go over the treatment choice then recommend only low-dose protocol given age and chronic kidney disease  Pulmonary emphysema, unspecified emphysema type (HCC)  -Currently stable without any flareup  Plan - Continue Spiriva Respimat  Exercise hypoxemia Nocturnal hypoxemia  -Your oxygen level drops just walking 10 feet  Plan - Continue 2 L oxygen at night and with minimal exertion  Coronary artery calcification seen on CAT scan Enlarged pulmonary artery (HCC) Pedal edema  Plan -Do blood work for BNP - Do echocardiogram - Refer cardiology for evaluation of right heart catheterization (Drs Janett Labella, [place], [place], [place], Bensimhon)  Followup  - Next few weeks with nurse practitioner Janae Bridgeman not wait for breathing test to get this visit scheduled]
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
## 2022-10-07 DIAGNOSIS — J449 Chronic obstructive pulmonary disease, unspecified: Secondary | ICD-10-CM | POA: Diagnosis not present

## 2022-10-07 DIAGNOSIS — G4733 Obstructive sleep apnea (adult) (pediatric): Secondary | ICD-10-CM | POA: Diagnosis not present

## 2022-10-07 DIAGNOSIS — R296 Repeated falls: Secondary | ICD-10-CM | POA: Diagnosis not present

## 2022-10-07 DIAGNOSIS — M79604 Pain in right leg: Secondary | ICD-10-CM | POA: Diagnosis not present

## 2022-10-07 LAB — BRAIN NATRIURETIC PEPTIDE: Pro B Natriuretic peptide (BNP): 388 pg/mL — ABNORMAL HIGH (ref 0.0–100.0)

## 2022-10-08 ENCOUNTER — Inpatient Hospital Stay (HOSPITAL_COMMUNITY)
Admission: EM | Admit: 2022-10-08 | Discharge: 2022-10-15 | DRG: 286 | Disposition: A | Discharge status: Home Health | Payer: 59 | Attending: Internal Medicine | Admitting: Internal Medicine

## 2022-10-08 ENCOUNTER — Other Ambulatory Visit: Payer: Self-pay

## 2022-10-08 ENCOUNTER — Telehealth: Payer: Self-pay | Admitting: *Deleted

## 2022-10-08 ENCOUNTER — Encounter (HOSPITAL_COMMUNITY): Payer: Self-pay

## 2022-10-08 ENCOUNTER — Emergency Department (HOSPITAL_COMMUNITY): Payer: 59

## 2022-10-08 DIAGNOSIS — I252 Old myocardial infarction: Secondary | ICD-10-CM

## 2022-10-08 DIAGNOSIS — I428 Other cardiomyopathies: Secondary | ICD-10-CM | POA: Diagnosis present

## 2022-10-08 DIAGNOSIS — N1831 Chronic kidney disease, stage 3a: Secondary | ICD-10-CM | POA: Diagnosis not present

## 2022-10-08 DIAGNOSIS — D500 Iron deficiency anemia secondary to blood loss (chronic): Secondary | ICD-10-CM | POA: Diagnosis not present

## 2022-10-08 DIAGNOSIS — F329 Major depressive disorder, single episode, unspecified: Secondary | ICD-10-CM | POA: Diagnosis not present

## 2022-10-08 DIAGNOSIS — J479 Bronchiectasis, uncomplicated: Secondary | ICD-10-CM | POA: Diagnosis not present

## 2022-10-08 DIAGNOSIS — E872 Acidosis, unspecified: Secondary | ICD-10-CM | POA: Diagnosis present

## 2022-10-08 DIAGNOSIS — I2781 Cor pulmonale (chronic): Secondary | ICD-10-CM | POA: Diagnosis present

## 2022-10-08 DIAGNOSIS — K219 Gastro-esophageal reflux disease without esophagitis: Secondary | ICD-10-CM | POA: Diagnosis not present

## 2022-10-08 DIAGNOSIS — H9190 Unspecified hearing loss, unspecified ear: Secondary | ICD-10-CM

## 2022-10-08 DIAGNOSIS — Z7984 Long term (current) use of oral hypoglycemic drugs: Secondary | ICD-10-CM

## 2022-10-08 DIAGNOSIS — M353 Polymyalgia rheumatica: Secondary | ICD-10-CM | POA: Diagnosis present

## 2022-10-08 DIAGNOSIS — E785 Hyperlipidemia, unspecified: Secondary | ICD-10-CM | POA: Diagnosis present

## 2022-10-08 DIAGNOSIS — J84112 Idiopathic pulmonary fibrosis: Secondary | ICD-10-CM | POA: Diagnosis not present

## 2022-10-08 DIAGNOSIS — I272 Pulmonary hypertension, unspecified: Secondary | ICD-10-CM | POA: Diagnosis not present

## 2022-10-08 DIAGNOSIS — Z955 Presence of coronary angioplasty implant and graft: Secondary | ICD-10-CM

## 2022-10-08 DIAGNOSIS — N1832 Chronic kidney disease, stage 3b: Secondary | ICD-10-CM | POA: Diagnosis present

## 2022-10-08 DIAGNOSIS — J8410 Pulmonary fibrosis, unspecified: Secondary | ICD-10-CM | POA: Diagnosis not present

## 2022-10-08 DIAGNOSIS — J849 Interstitial pulmonary disease, unspecified: Secondary | ICD-10-CM | POA: Diagnosis present

## 2022-10-08 DIAGNOSIS — E878 Other disorders of electrolyte and fluid balance, not elsewhere classified: Secondary | ICD-10-CM | POA: Diagnosis not present

## 2022-10-08 DIAGNOSIS — N179 Acute kidney failure, unspecified: Secondary | ICD-10-CM | POA: Diagnosis not present

## 2022-10-08 DIAGNOSIS — Z1152 Encounter for screening for COVID-19: Secondary | ICD-10-CM | POA: Diagnosis not present

## 2022-10-08 DIAGNOSIS — I5082 Biventricular heart failure: Secondary | ICD-10-CM | POA: Diagnosis present

## 2022-10-08 DIAGNOSIS — E039 Hypothyroidism, unspecified: Secondary | ICD-10-CM | POA: Diagnosis present

## 2022-10-08 DIAGNOSIS — D509 Iron deficiency anemia, unspecified: Secondary | ICD-10-CM | POA: Insufficient documentation

## 2022-10-08 DIAGNOSIS — J439 Emphysema, unspecified: Secondary | ICD-10-CM | POA: Diagnosis not present

## 2022-10-08 DIAGNOSIS — I5022 Chronic systolic (congestive) heart failure: Secondary | ICD-10-CM

## 2022-10-08 DIAGNOSIS — I10 Essential (primary) hypertension: Secondary | ICD-10-CM | POA: Diagnosis not present

## 2022-10-08 DIAGNOSIS — I5020 Unspecified systolic (congestive) heart failure: Secondary | ICD-10-CM | POA: Diagnosis not present

## 2022-10-08 DIAGNOSIS — I2729 Other secondary pulmonary hypertension: Secondary | ICD-10-CM | POA: Diagnosis not present

## 2022-10-08 DIAGNOSIS — J9620 Acute and chronic respiratory failure, unspecified whether with hypoxia or hypercapnia: Secondary | ICD-10-CM | POA: Diagnosis present

## 2022-10-08 DIAGNOSIS — I5030 Unspecified diastolic (congestive) heart failure: Secondary | ICD-10-CM | POA: Diagnosis not present

## 2022-10-08 DIAGNOSIS — Z8616 Personal history of COVID-19: Secondary | ICD-10-CM

## 2022-10-08 DIAGNOSIS — E1122 Type 2 diabetes mellitus with diabetic chronic kidney disease: Secondary | ICD-10-CM | POA: Diagnosis present

## 2022-10-08 DIAGNOSIS — E861 Hypovolemia: Secondary | ICD-10-CM | POA: Diagnosis present

## 2022-10-08 DIAGNOSIS — J9621 Acute and chronic respiratory failure with hypoxia: Secondary | ICD-10-CM | POA: Diagnosis not present

## 2022-10-08 DIAGNOSIS — I5043 Acute on chronic combined systolic (congestive) and diastolic (congestive) heart failure: Secondary | ICD-10-CM | POA: Diagnosis not present

## 2022-10-08 DIAGNOSIS — I130 Hypertensive heart and chronic kidney disease with heart failure and stage 1 through stage 4 chronic kidney disease, or unspecified chronic kidney disease: Secondary | ICD-10-CM | POA: Diagnosis not present

## 2022-10-08 DIAGNOSIS — I509 Heart failure, unspecified: Principal | ICD-10-CM

## 2022-10-08 DIAGNOSIS — N183 Chronic kidney disease, stage 3 unspecified: Secondary | ICD-10-CM | POA: Diagnosis present

## 2022-10-08 DIAGNOSIS — Z87891 Personal history of nicotine dependence: Secondary | ICD-10-CM

## 2022-10-08 DIAGNOSIS — J449 Chronic obstructive pulmonary disease, unspecified: Secondary | ICD-10-CM | POA: Diagnosis present

## 2022-10-08 DIAGNOSIS — Z6828 Body mass index (BMI) 28.0-28.9, adult: Secondary | ICD-10-CM

## 2022-10-08 DIAGNOSIS — Z8673 Personal history of transient ischemic attack (TIA), and cerebral infarction without residual deficits: Secondary | ICD-10-CM

## 2022-10-08 DIAGNOSIS — I491 Atrial premature depolarization: Secondary | ICD-10-CM | POA: Diagnosis not present

## 2022-10-08 DIAGNOSIS — Z79899 Other long term (current) drug therapy: Secondary | ICD-10-CM

## 2022-10-08 DIAGNOSIS — E669 Obesity, unspecified: Secondary | ICD-10-CM | POA: Diagnosis present

## 2022-10-08 DIAGNOSIS — Z79890 Hormone replacement therapy (postmenopausal): Secondary | ICD-10-CM

## 2022-10-08 DIAGNOSIS — Z9981 Dependence on supplemental oxygen: Secondary | ICD-10-CM

## 2022-10-08 DIAGNOSIS — J8417 Interstitial lung disease with progressive fibrotic phenotype in diseases classified elsewhere: Secondary | ICD-10-CM | POA: Diagnosis not present

## 2022-10-08 DIAGNOSIS — I420 Dilated cardiomyopathy: Secondary | ICD-10-CM | POA: Diagnosis not present

## 2022-10-08 DIAGNOSIS — R0602 Shortness of breath: Secondary | ICD-10-CM | POA: Diagnosis not present

## 2022-10-08 DIAGNOSIS — I110 Hypertensive heart disease with heart failure: Secondary | ICD-10-CM | POA: Diagnosis not present

## 2022-10-08 DIAGNOSIS — Z8249 Family history of ischemic heart disease and other diseases of the circulatory system: Secondary | ICD-10-CM

## 2022-10-08 LAB — TROPONIN I (HIGH SENSITIVITY): Troponin I (High Sensitivity): 15 ng/L (ref <18)

## 2022-10-08 LAB — COMPREHENSIVE METABOLIC PANEL
ALT: 17 U/L (ref 0–44)
AST: 27 U/L (ref 15–41)
Albumin: 3.3 g/dL — ABNORMAL LOW (ref 3.5–5.0)
Alkaline Phosphatase: 96 U/L (ref 38–126)
Anion gap: 11 (ref 5–15)
BUN: 16 mg/dL (ref 8–23)
CO2: 20 mmol/L — ABNORMAL LOW (ref 22–32)
Calcium: 10 mg/dL (ref 8.9–10.3)
Chloride: 112 mmol/L — ABNORMAL HIGH (ref 98–111)
Creatinine, Ser: 1.42 mg/dL — ABNORMAL HIGH (ref 0.44–1.00)
GFR, Estimated: 36 mL/min — ABNORMAL LOW (ref >60)
Glucose, Bld: 172 mg/dL — ABNORMAL HIGH (ref 70–99)
Potassium: 4.4 mmol/L (ref 3.5–5.1)
Sodium: 143 mmol/L (ref 135–145)
Total Bilirubin: 0.8 mg/dL (ref 0.3–1.2)
Total Protein: 6.8 g/dL (ref 6.5–8.1)

## 2022-10-08 LAB — CBC WITH DIFFERENTIAL/PLATELET
Abs Immature Granulocytes: 0.02 10*3/uL (ref 0.00–0.07)
Basophils Absolute: 0 10*3/uL (ref 0.0–0.1)
Basophils Relative: 1 %
Eosinophils Absolute: 0.6 10*3/uL — ABNORMAL HIGH (ref 0.0–0.5)
Eosinophils Relative: 7 %
HCT: 27 % — ABNORMAL LOW (ref 36.0–46.0)
Hemoglobin: 8.2 g/dL — ABNORMAL LOW (ref 12.0–15.0)
Immature Granulocytes: 0 %
Lymphocytes Relative: 15 %
Lymphs Abs: 1.3 10*3/uL (ref 0.7–4.0)
MCH: 23.1 pg — ABNORMAL LOW (ref 26.0–34.0)
MCHC: 30.4 g/dL (ref 30.0–36.0)
MCV: 76.1 fL — ABNORMAL LOW (ref 80.0–100.0)
Monocytes Absolute: 0.7 10*3/uL (ref 0.1–1.0)
Monocytes Relative: 8 %
Neutro Abs: 6.1 10*3/uL (ref 1.7–7.7)
Neutrophils Relative %: 69 %
Platelets: 246 10*3/uL (ref 150–400)
RBC: 3.55 MIL/uL — ABNORMAL LOW (ref 3.87–5.11)
RDW: 19.9 % — ABNORMAL HIGH (ref 11.5–15.5)
WBC: 8.8 10*3/uL (ref 4.0–10.5)
nRBC: 0 % (ref 0.0–0.2)

## 2022-10-08 LAB — BRAIN NATRIURETIC PEPTIDE: B Natriuretic Peptide: 512.5 pg/mL — ABNORMAL HIGH (ref 0.0–100.0)

## 2022-10-08 NOTE — Telephone Encounter (Signed)
RTC to [facility] verified approval for change in Manufacturer of the Levothyroxine.
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
## 2022-10-08 NOTE — Telephone Encounter (Signed)
Call from patient's daughter Elease Hashimoto.  Patient has een having problems since starting the Remeron.  Appetite is better.  Patient is getting dizzy and anxious.  Has trembling now and lips are moving per daughter.  Daughter would like to stop the Remeron but patient refuses.  Sting that is is making her eat and regain her weight.  Would like a call from her PCP to see if medication dosage can be changed or a new medication ordered.  Daughter can be reached at [phone].

## 2022-10-08 NOTE — ED Provider Notes (Signed)
EMERGENCY DEPARTMENT AT [facility] Provider Note   CSN: [id number] Arrival date & time: 10/08/22  2223     History  Chief Complaint  Patient presents with   Shortness of Breath    Caitlyn Aguirre is a 87 y.o. female.  Patient presents to the emergency department for evaluation of shortness of breath.  Patient reports that her symptoms have progressed over a period of a couple of days.  Reports that she got very short of breath tonight while walking.       Home Medications Prior to Admission medications   Medication Sig Start Date End Date Taking? Authorizing Provider  Accu-Chek Softclix Lancets lancets Check 1 time a day as instructed 06/28/19   [name], MD  albuterol (VENTOLIN HFA) 108 (90 Base) MCG/ACT inhaler Inhale 1-2 puffs into the lungs every 6 (six) hours as needed for wheezing or shortness of breath. 02/09/22   [name], MD  amLODipine (NORVASC) 5 MG tablet Take 1 tablet (5 mg total) by mouth daily. 02/09/22 02/09/23  [name], MD  atorvastatin (LIPITOR) 40 MG tablet Take 1 tablet (40 mg total) by mouth daily. 02/09/22   [name], MD  Blood Glucose Monitoring Suppl (ACCU-CHEK GUIDE ME) w/Device KIT Check 1 times a day as instructed 06/28/19   [name], MD  latanoprost (XALATAN) 0.005 % ophthalmic solution Place 1 drop into both eyes at bedtime.    [provider]  levothyroxine (SYNTHROID) 75 MCG tablet Take 1 tablet (75 mcg total) by mouth daily. 02/09/22   [name], MD  metFORMIN (GLUCOPHAGE) 1000 MG tablet Take 1 tablet (1,000 mg total) by mouth daily with breakfast. 02/09/22   [name], MD  mirtazapine (REMERON) 7.5 MG tablet Take 1 tablet (7.5 mg total) by mouth at bedtime. 09/28/22   [name], MD  omeprazole (PRILOSEC) 20 MG capsule Take 1 capsule (20 mg total) by mouth daily. 02/09/22   [name], MD  OXYGEN Inhale 2 L into the  lungs daily as needed (for breathing).     [provider]  PARoxetine (PAXIL) 30 MG tablet Take 1 tablet (30 mg total) by mouth daily. 02/09/22   [name], MD  tiotropium (SPIRIVA HANDIHALER) 18 MCG inhalation capsule Place 1 capsule (18 mcg total) into inhaler and inhale daily. INHALE THE CONTENTS OF 1  CAPSULE BY MOUTH VIA  HANDIHALER DAILY Strength: 18 mcg 02/09/22   [name], MD      Allergies    Patient has no known allergies.    Review of Systems   Review of Systems  Physical Exam Updated Vital Signs BP 122/71   Pulse 96   Temp 98 F (36.7 C)   Resp (!) 28   SpO2 96%  Physical Exam Vitals and nursing note reviewed.  Constitutional:      General: She is not in acute distress.    Appearance: She is well-developed.  HENT:     Head: Normocephalic and atraumatic.     Mouth/Throat:     Mouth: Mucous membranes are moist.  Eyes:     General: Vision grossly intact. Gaze aligned appropriately.     Extraocular Movements: Extraocular movements intact.     Conjunctiva/sclera: Conjunctivae normal.  Cardiovascular:     Rate and Rhythm: Normal rate and regular rhythm.     Pulses: Normal pulses.     Heart sounds: Normal heart sounds, S1 normal and S2  normal. No murmur heard.    No friction rub. No gallop.  Pulmonary:     Effort: Pulmonary effort is normal. No respiratory distress.     Breath sounds: Normal breath sounds.  Abdominal:     General: Bowel sounds are normal.     Palpations: Abdomen is soft.     Tenderness: There is no abdominal tenderness. There is no guarding or rebound.     Hernia: No hernia is present.  Musculoskeletal:        General: No swelling.     Cervical back: Full passive range of motion without pain, normal range of motion and neck supple. No spinous process tenderness or muscular tenderness. Normal range of motion.     Right lower leg: No edema.     Left lower leg: No edema.  Skin:    General: Skin is warm and dry.      Capillary Refill: Capillary refill takes less than 2 seconds.     Findings: No ecchymosis, erythema, rash or wound.  Neurological:     General: No focal deficit present.     Mental Status: She is alert and oriented to person, place, and time.     GCS: GCS eye subscore is 4. GCS verbal subscore is 5. GCS motor subscore is 6.     Cranial Nerves: Cranial nerves 2-12 are intact.     Sensory: Sensation is intact.     Motor: Motor function is intact.     Coordination: Coordination is intact.  Psychiatric:        Attention and Perception: Attention normal.        Mood and Affect: Mood normal.        Speech: Speech normal.        Behavior: Behavior normal.     ED Results / Procedures / Treatments   Labs (all labs ordered are listed, but only abnormal results are displayed) Labs Reviewed  CBC WITH DIFFERENTIAL/PLATELET - Abnormal; Notable for the following components:      Result Value   RBC 3.55 (*)    Hemoglobin 8.2 (*)    HCT 27.0 (*)    MCV 76.1 (*)    MCH 23.1 (*)    RDW 19.9 (*)    Eosinophils Absolute 0.6 (*)    All other components within normal limits  COMPREHENSIVE METABOLIC PANEL - Abnormal; Notable for the following components:   Chloride 112 (*)    CO2 20 (*)    Glucose, Bld 172 (*)    Creatinine, Ser 1.42 (*)    Albumin 3.3 (*)    GFR, Estimated 36 (*)    All other components within normal limits  BRAIN NATRIURETIC PEPTIDE - Abnormal; Notable for the following components:   B Natriuretic Peptide 512.5 (*)    All other components within normal limits  RESP PANEL BY RT-PCR (RSV, FLU A&B, COVID)  RVPGX2  POC OCCULT BLOOD, ED  TROPONIN I (HIGH SENSITIVITY)  TROPONIN I (HIGH SENSITIVITY)    EKG None  Radiology DG Chest Portable 1 View  Result Date: 10/08/2022 CLINICAL DATA:  Shortness of breath EXAM: PORTABLE CHEST 1 VIEW COMPARISON:  09/26/2021, CT 08/13/2022 FINDINGS: Emphysema and chronic fibrotic lung disease and bronchiectasis. Mildly enlarged  cardiomediastinal silhouette. No pleural effusion or pneumothorax. No acute superimposed confluent airspace disease. IMPRESSION: Emphysema and chronic fibrotic lung disease. No definite superimposed acute airspace disease Electronically Signed   By: Jasmine Pang M.D.   On: 10/08/2022 23:24    Procedures Procedures  Medications Ordered in ED Medications  furosemide (LASIX) injection 40 mg (has no administration in time range)    ED Course/ Medical Decision Making/ A&P                                 Medical Decision Making Amount and/or Complexity of Data Reviewed External Data Reviewed: notes.    Details: ECHO 05/31/2021  IMPRESSIONS     1. Abnormal septal motion . Left ventricular ejection fraction, by  estimation, is 55 to 60%. The left ventricle has normal function. The left  ventricle has no regional wall motion abnormalities. There is mild left  ventricular hypertrophy. Left  ventricular diastolic parameters are consistent with Grade I diastolic  dysfunction (impaired relaxation).   2. Right ventricular systolic function is normal. The right ventricular  size is normal. There is normal pulmonary artery systolic pressure.   3. Left atrial size was mildly dilated.   4. The mitral valve is abnormal. Trivial mitral valve regurgitation. No  evidence of mitral stenosis.   5. The aortic valve is tricuspid. There is mild calcification of the  aortic valve. There is mild thickening of the aortic valve. Aortic valve  regurgitation is not visualized. Aortic valve sclerosis is present, with  no evidence of aortic valve stenosis.   6. Pulmonic valve regurgitation is moderate.   7. The inferior vena cava is normal in size with greater than 50%  respiratory variability, suggesting right atrial pressure of 3 mmHg.   8. Cannot exclude a small PFO.   Labs: ordered. Decision-making details documented in ED Course. Radiology: ordered and independent interpretation performed.  Decision-making details documented in ED Course. ECG/medicine tests: ordered and independent interpretation performed. Decision-making details documented in ED Course.  Risk Prescription drug management.  Patient presents to the emergency department for evaluation of shortness of breath.  She does have a history of chronic respiratory failure, chronically on 2 L oxygen.  She was seen by pulmonology this week for consultation after she had a CT that showed possible interstitial fibrosis.  Reviewing these notes from Dr. Marchelle Gearing, he feels that she has UIP.  Patient tells me, however, that she has been noticing swelling of her legs over the last couple of days and then the shortness of breath developed tonight.  She does have a history of grade 1 diastolic heart failure, not on any diuretics.  Patient significantly improved on BiPAP.  No obvious edema on x-ray but she does have fairly significant interstitial disease, would be difficult to identify.  BNP is above 500.  Patient diuresed with Lasix.  She has been weaned off of BiPAP, tolerating nasal cannula oxygen.  Will admit for further management.  CRITICAL CARE Performed by: Gilda Crease   Total critical care time: 30 minutes  Critical care time was exclusive of separately billable procedures and treating other patients.  Critical care was necessary to treat or prevent imminent or life-threatening deterioration.  Critical care was time spent personally by me on the following activities: development of treatment plan with patient and/or surrogate as well as nursing, discussions with consultants, evaluation of patient's response to treatment, examination of patient, obtaining history from patient or surrogate, ordering and performing treatments and interventions, ordering and review of laboratory studies, ordering and review of radiographic studies, pulse oximetry and re-evaluation of patient's condition.         Final Clinical  Impression(s) / ED Diagnoses Final diagnoses:  Acute congestive heart failure, unspecified heart failure type [facility])    Rx / DC Orders ED Discharge Orders     None         Kalief Kattner, Canary Brim, MD 10/09/22 0131

## 2022-10-08 NOTE — Telephone Encounter (Signed)
Sounds fine.  Thank you.

## 2022-10-08 NOTE — Telephone Encounter (Signed)
Those side effects sound significant. I recommend she stop taking the remeron for now. It was already the lowest dose, so cannot go lower. Will need to give it some time to see if these symptoms resolve, and we can talk about trying a different medication in a visit in a few weeks.

## 2022-10-08 NOTE — Telephone Encounter (Signed)
RTC to patient's daughter Elease Hashimoto asked her to have patient  to stop Remeron and watch to see if the symptoms go away. This is the lowest dose for the Remeron available.   Will bring patient in for appointment later this month to talk about alternatives for her.   Daughter voiced understanding of the plan.

## 2022-10-08 NOTE — Telephone Encounter (Signed)
Call from [facility] needs ok to chang current manufacture Amneal to Lupin for patients Lexothyroxine.  Verbal Ok given will sent to PCP for final approval.

## 2022-10-09 ENCOUNTER — Other Ambulatory Visit: Payer: Self-pay

## 2022-10-09 ENCOUNTER — Observation Stay (HOSPITAL_BASED_OUTPATIENT_CLINIC_OR_DEPARTMENT_OTHER): Payer: 59

## 2022-10-09 DIAGNOSIS — I428 Other cardiomyopathies: Secondary | ICD-10-CM | POA: Diagnosis present

## 2022-10-09 DIAGNOSIS — Z8616 Personal history of COVID-19: Secondary | ICD-10-CM | POA: Diagnosis not present

## 2022-10-09 DIAGNOSIS — E878 Other disorders of electrolyte and fluid balance, not elsewhere classified: Secondary | ICD-10-CM | POA: Diagnosis present

## 2022-10-09 DIAGNOSIS — E785 Hyperlipidemia, unspecified: Secondary | ICD-10-CM | POA: Diagnosis present

## 2022-10-09 DIAGNOSIS — D500 Iron deficiency anemia secondary to blood loss (chronic): Secondary | ICD-10-CM | POA: Diagnosis not present

## 2022-10-09 DIAGNOSIS — J9621 Acute and chronic respiratory failure with hypoxia: Secondary | ICD-10-CM | POA: Diagnosis not present

## 2022-10-09 DIAGNOSIS — E872 Acidosis, unspecified: Secondary | ICD-10-CM | POA: Diagnosis present

## 2022-10-09 DIAGNOSIS — I5082 Biventricular heart failure: Secondary | ICD-10-CM | POA: Diagnosis present

## 2022-10-09 DIAGNOSIS — J849 Interstitial pulmonary disease, unspecified: Secondary | ICD-10-CM | POA: Diagnosis not present

## 2022-10-09 DIAGNOSIS — J8417 Interstitial lung disease with progressive fibrotic phenotype in diseases classified elsewhere: Secondary | ICD-10-CM

## 2022-10-09 DIAGNOSIS — F329 Major depressive disorder, single episode, unspecified: Secondary | ICD-10-CM

## 2022-10-09 DIAGNOSIS — I5030 Unspecified diastolic (congestive) heart failure: Secondary | ICD-10-CM | POA: Diagnosis not present

## 2022-10-09 DIAGNOSIS — I420 Dilated cardiomyopathy: Secondary | ICD-10-CM | POA: Diagnosis not present

## 2022-10-09 DIAGNOSIS — H9190 Unspecified hearing loss, unspecified ear: Secondary | ICD-10-CM | POA: Diagnosis present

## 2022-10-09 DIAGNOSIS — J8410 Pulmonary fibrosis, unspecified: Secondary | ICD-10-CM | POA: Diagnosis not present

## 2022-10-09 DIAGNOSIS — I130 Hypertensive heart and chronic kidney disease with heart failure and stage 1 through stage 4 chronic kidney disease, or unspecified chronic kidney disease: Secondary | ICD-10-CM | POA: Diagnosis present

## 2022-10-09 DIAGNOSIS — I2781 Cor pulmonale (chronic): Secondary | ICD-10-CM | POA: Diagnosis present

## 2022-10-09 DIAGNOSIS — J9620 Acute and chronic respiratory failure, unspecified whether with hypoxia or hypercapnia: Secondary | ICD-10-CM | POA: Diagnosis present

## 2022-10-09 DIAGNOSIS — D509 Iron deficiency anemia, unspecified: Secondary | ICD-10-CM | POA: Diagnosis not present

## 2022-10-09 DIAGNOSIS — N1831 Chronic kidney disease, stage 3a: Secondary | ICD-10-CM | POA: Diagnosis not present

## 2022-10-09 DIAGNOSIS — E1122 Type 2 diabetes mellitus with diabetic chronic kidney disease: Secondary | ICD-10-CM | POA: Diagnosis present

## 2022-10-09 DIAGNOSIS — I2729 Other secondary pulmonary hypertension: Secondary | ICD-10-CM | POA: Diagnosis present

## 2022-10-09 DIAGNOSIS — E669 Obesity, unspecified: Secondary | ICD-10-CM | POA: Diagnosis present

## 2022-10-09 DIAGNOSIS — M353 Polymyalgia rheumatica: Secondary | ICD-10-CM | POA: Diagnosis present

## 2022-10-09 DIAGNOSIS — J84112 Idiopathic pulmonary fibrosis: Secondary | ICD-10-CM | POA: Diagnosis present

## 2022-10-09 DIAGNOSIS — I509 Heart failure, unspecified: Secondary | ICD-10-CM | POA: Diagnosis not present

## 2022-10-09 DIAGNOSIS — R0600 Dyspnea, unspecified: Secondary | ICD-10-CM | POA: Diagnosis not present

## 2022-10-09 DIAGNOSIS — K219 Gastro-esophageal reflux disease without esophagitis: Secondary | ICD-10-CM | POA: Diagnosis present

## 2022-10-09 DIAGNOSIS — Z1152 Encounter for screening for COVID-19: Secondary | ICD-10-CM | POA: Diagnosis not present

## 2022-10-09 DIAGNOSIS — I5043 Acute on chronic combined systolic (congestive) and diastolic (congestive) heart failure: Secondary | ICD-10-CM | POA: Diagnosis not present

## 2022-10-09 DIAGNOSIS — I5020 Unspecified systolic (congestive) heart failure: Secondary | ICD-10-CM | POA: Diagnosis not present

## 2022-10-09 DIAGNOSIS — J449 Chronic obstructive pulmonary disease, unspecified: Secondary | ICD-10-CM | POA: Diagnosis not present

## 2022-10-09 DIAGNOSIS — E039 Hypothyroidism, unspecified: Secondary | ICD-10-CM | POA: Diagnosis present

## 2022-10-09 DIAGNOSIS — N179 Acute kidney failure, unspecified: Secondary | ICD-10-CM | POA: Diagnosis not present

## 2022-10-09 DIAGNOSIS — I272 Pulmonary hypertension, unspecified: Secondary | ICD-10-CM | POA: Diagnosis not present

## 2022-10-09 LAB — CBC
HCT: 26.2 % — ABNORMAL LOW (ref 36.0–46.0)
Hemoglobin: 8.1 g/dL — ABNORMAL LOW (ref 12.0–15.0)
MCH: 23.5 pg — ABNORMAL LOW (ref 26.0–34.0)
MCHC: 30.9 g/dL (ref 30.0–36.0)
MCV: 75.9 fL — ABNORMAL LOW (ref 80.0–100.0)
Platelets: 214 10*3/uL (ref 150–400)
RBC: 3.45 MIL/uL — ABNORMAL LOW (ref 3.87–5.11)
RDW: 19.9 % — ABNORMAL HIGH (ref 11.5–15.5)
WBC: 8 10*3/uL (ref 4.0–10.5)
nRBC: 0 % (ref 0.0–0.2)

## 2022-10-09 LAB — IRON AND TIBC
Iron: 20 ug/dL — ABNORMAL LOW (ref 28–170)
Saturation Ratios: 6 % — ABNORMAL LOW (ref 10.4–31.8)
TIBC: 351 ug/dL (ref 250–450)
UIBC: 331 ug/dL

## 2022-10-09 LAB — TSH: TSH: 1.491 u[IU]/mL (ref 0.350–4.500)

## 2022-10-09 LAB — RENAL FUNCTION PANEL
Albumin: 3.1 g/dL — ABNORMAL LOW (ref 3.5–5.0)
Anion gap: 7 (ref 5–15)
BUN: 17 mg/dL (ref 8–23)
CO2: 23 mmol/L (ref 22–32)
Calcium: 10 mg/dL (ref 8.9–10.3)
Chloride: 111 mmol/L (ref 98–111)
Creatinine, Ser: 1.47 mg/dL — ABNORMAL HIGH (ref 0.44–1.00)
GFR, Estimated: 35 mL/min — ABNORMAL LOW (ref >60)
Glucose, Bld: 137 mg/dL — ABNORMAL HIGH (ref 70–99)
Phosphorus: 3.5 mg/dL (ref 2.5–4.6)
Potassium: 4.5 mmol/L (ref 3.5–5.1)
Sodium: 141 mmol/L (ref 135–145)

## 2022-10-09 LAB — GLUCOSE, CAPILLARY
Glucose-Capillary: 169 mg/dL — ABNORMAL HIGH (ref 70–99)
Glucose-Capillary: 95 mg/dL (ref 70–99)
Glucose-Capillary: 166 mg/dL — ABNORMAL HIGH (ref 70–99)
Glucose-Capillary: 182 mg/dL — ABNORMAL HIGH (ref 70–99)

## 2022-10-09 LAB — URINALYSIS, ROUTINE W REFLEX MICROSCOPIC
Bilirubin Urine: NEGATIVE
Glucose, UA: NEGATIVE mg/dL
Hgb urine dipstick: NEGATIVE
Ketones, ur: NEGATIVE mg/dL
Leukocytes,Ua: NEGATIVE
Nitrite: NEGATIVE
Protein, ur: NEGATIVE mg/dL
Specific Gravity, Urine: 1.006 (ref 1.005–1.030)
pH: 5 (ref 5.0–8.0)

## 2022-10-09 LAB — FERRITIN: Ferritin: 6 ng/mL — ABNORMAL LOW (ref 11–307)

## 2022-10-09 LAB — RESP PANEL BY RT-PCR (RSV, FLU A&B, COVID)  RVPGX2
Influenza A by PCR: NEGATIVE
Influenza B by PCR: NEGATIVE
Resp Syncytial Virus by PCR: NEGATIVE
SARS Coronavirus 2 by RT PCR: NEGATIVE

## 2022-10-09 LAB — ECHOCARDIOGRAM COMPLETE
Area-P 1/2: 5.75 cm2
Calc EF: 29.2 %
S' Lateral: 4.6 cm
Single Plane A2C EF: 35.5 %
Single Plane A4C EF: 26.3 %
Weight: 3022.95 [oz_av]

## 2022-10-09 LAB — MAGNESIUM: Magnesium: 1.3 mg/dL — ABNORMAL LOW (ref 1.7–2.4)

## 2022-10-09 LAB — TROPONIN I (HIGH SENSITIVITY): Troponin I (High Sensitivity): 15 ng/L (ref <18)

## 2022-10-09 MED ORDER — SENNOSIDES-DOCUSATE SODIUM 8.6-50 MG PO TABS
1.0000 | ORAL_TABLET | Freq: Every day | ORAL | Status: DC
  Administered 2022-10-10 – 2022-10-14 (×5): 1 via ORAL
  Filled 2022-10-09 (×5): qty 1

## 2022-10-09 MED ORDER — LEVOTHYROXINE SODIUM 75 MCG PO TABS
75.0000 ug | ORAL_TABLET | Freq: Every day | ORAL | Status: DC
  Administered 2022-10-09 – 2022-10-15 (×7): 75 ug via ORAL
  Filled 2022-10-09 (×7): qty 1

## 2022-10-09 MED ORDER — ATORVASTATIN CALCIUM 40 MG PO TABS
40.0000 mg | ORAL_TABLET | Freq: Every day | ORAL | Status: DC
  Administered 2022-10-09 – 2022-10-15 (×7): 40 mg via ORAL
  Filled 2022-10-09 (×7): qty 1

## 2022-10-09 MED ORDER — FUROSEMIDE 10 MG/ML IJ SOLN
40.0000 mg | Freq: Once | INTRAMUSCULAR | Status: AC
  Administered 2022-10-09: 40 mg via INTRAVENOUS
  Filled 2022-10-09: qty 4

## 2022-10-09 MED ORDER — AMLODIPINE BESYLATE 5 MG PO TABS: 5.0000 mg | ORAL_TABLET | Freq: Every day | ORAL | 3 refills | Status: DC

## 2022-10-09 MED ORDER — PERFLUTREN LIPID MICROSPHERE
1.0000 mL | INTRAVENOUS | Status: AC | PRN
  Administered 2022-10-09: 6 mL via INTRAVENOUS

## 2022-10-09 MED ORDER — ACETAMINOPHEN 650 MG RE SUPP: 650.0000 mg | Freq: Four times a day (QID) | RECTAL | Status: DC | PRN

## 2022-10-09 MED ORDER — REVEFENACIN 175 MCG/3ML IN SOLN
175.0000 ug | Freq: Every day | RESPIRATORY_TRACT | Status: DC
  Administered 2022-10-10 – 2022-10-15 (×4): 175 ug via RESPIRATORY_TRACT
  Filled 2022-10-09 (×7): qty 3

## 2022-10-09 MED ORDER — SACUBITRIL-VALSARTAN 24-26 MG PO TABS
1.0000 | ORAL_TABLET | Freq: Two times a day (BID) | ORAL | Status: DC
  Administered 2022-10-10: 1 via ORAL
  Filled 2022-10-09 (×2): qty 1

## 2022-10-09 MED ORDER — INSULIN ASPART 100 UNIT/ML IJ SOLN
0.0000 [IU] | Freq: Three times a day (TID) | INTRAMUSCULAR | Status: DC
  Administered 2022-10-09 (×2): 2 [IU] via SUBCUTANEOUS
  Administered 2022-10-10 – 2022-10-11 (×4): 1 [IU] via SUBCUTANEOUS
  Administered 2022-10-11: 2 [IU] via SUBCUTANEOUS
  Administered 2022-10-12: 1 [IU] via SUBCUTANEOUS
  Administered 2022-10-13 – 2022-10-15 (×4): 2 [IU] via SUBCUTANEOUS

## 2022-10-09 MED ORDER — BISOPROLOL FUMARATE 5 MG PO TABS
2.5000 mg | ORAL_TABLET | Freq: Every day | ORAL | Status: DC
  Administered 2022-10-09 – 2022-10-11 (×3): 2.5 mg via ORAL
  Filled 2022-10-09 (×3): qty 0.5

## 2022-10-09 MED ORDER — PAROXETINE HCL 30 MG PO TABS
30.0000 mg | ORAL_TABLET | Freq: Every day | ORAL | Status: DC
  Administered 2022-10-09 – 2022-10-15 (×7): 30 mg via ORAL
  Filled 2022-10-09 (×7): qty 1

## 2022-10-09 MED ORDER — IRON SUCROSE 500 MG IVPB - SIMPLE MED
500.0000 mg | Freq: Once | INTRAVENOUS | Status: AC
  Administered 2022-10-09: 500 mg via INTRAVENOUS
  Filled 2022-10-09: qty 275

## 2022-10-09 MED ORDER — FUROSEMIDE 40 MG PO TABS
40.0000 mg | ORAL_TABLET | Freq: Once | ORAL | Status: AC
  Administered 2022-10-09: 40 mg via ORAL
  Filled 2022-10-09: qty 1

## 2022-10-09 MED ORDER — LEVOTHYROXINE SODIUM 75 MCG PO TABS: 75.0000 ug | ORAL_TABLET | Freq: Every day | ORAL | 3 refills | Status: DC

## 2022-10-09 MED ORDER — UMECLIDINIUM BROMIDE 62.5 MCG/ACT IN AEPB
1.0000 | INHALATION_SPRAY | Freq: Every day | RESPIRATORY_TRACT | Status: DC
  Filled 2022-10-09: qty 7

## 2022-10-09 MED ORDER — ACETAMINOPHEN 325 MG PO TABS
650.0000 mg | ORAL_TABLET | Freq: Four times a day (QID) | ORAL | Status: DC | PRN
  Administered 2022-10-09 – 2022-10-13 (×9): 650 mg via ORAL
  Filled 2022-10-09 (×10): qty 2

## 2022-10-09 MED ORDER — LATANOPROST 0.005 % OP SOLN
1.0000 [drp] | Freq: Every day | OPHTHALMIC | Status: DC
  Filled 2022-10-09: qty 2.5

## 2022-10-09 MED ORDER — ENOXAPARIN SODIUM 40 MG/0.4ML IJ SOSY
40.0000 mg | PREFILLED_SYRINGE | INTRAMUSCULAR | Status: DC
  Administered 2022-10-09 – 2022-10-10 (×2): 40 mg via SUBCUTANEOUS
  Filled 2022-10-09 (×2): qty 0.4

## 2022-10-09 MED ORDER — PANTOPRAZOLE SODIUM 40 MG PO TBEC
40.0000 mg | DELAYED_RELEASE_TABLET | Freq: Every day | ORAL | Status: DC
  Administered 2022-10-09 – 2022-10-15 (×7): 40 mg via ORAL
  Filled 2022-10-09 (×7): qty 1

## 2022-10-09 MED ORDER — LEVALBUTEROL HCL 0.63 MG/3ML IN NEBU: 0.6300 mg | INHALATION_SOLUTION | Freq: Three times a day (TID) | RESPIRATORY_TRACT | Status: DC | PRN

## 2022-10-09 MED ORDER — INSULIN ASPART 100 UNIT/ML IJ SOLN: 0.0000 [IU] | Freq: Every day | INTRAMUSCULAR | Status: DC

## 2022-10-09 MED ORDER — ALBUTEROL SULFATE (2.5 MG/3ML) 0.083% IN NEBU: 2.5000 mg | INHALATION_SOLUTION | Freq: Four times a day (QID) | RESPIRATORY_TRACT | Status: DC | PRN

## 2022-10-09 NOTE — Progress Notes (Signed)
  Echocardiogram 2D Echocardiogram has been performed.  Caitlyn Aguirre 10/09/2022, 10:17 AM

## 2022-10-09 NOTE — Evaluation (Signed)
Physical Therapy Evaluation Patient Details Name: Caitlyn Aguirre MRN: [id number] DOB: 1935/06/04 Today's Date: 10/09/2022  History of Present Illness  87 yo female admitted 9/5 with SOB and LB edema with CHF exacerbation. PMhx: CHF, LVEF 55-60%, COPD, ILD on chronic 2L O2, HTN, CKD, T2DM, MDD, Polymyalgia rheumatica, pulmonary hypertension  Clinical Impression  Pt pleasant and reports progressive decline in strength and function with decreased pulmonary function for "some time" but unable to state exactly how long that has been with need for using motorized cart in stores and only walking limited distance in home. Pt lives with daughter who can assist with pt requiring 2-3L at present for limited gait. Pt with decreased activity tolerance, gait and function who will benefit from acute therapy to maximize mobility and safety. Encouraged OOB and up to bathroom/chair daily.       If plan is discharge home, recommend the following: Assistance with cooking/housework;Assist for transportation;Help with stairs or ramp for entrance   Can travel by private vehicle        Equipment Recommendations Rollator (4 wheels);BSC/3in1, pulse oximeter  Recommendations for Other Services       Functional Status Assessment Patient has had a recent decline in their functional status and demonstrates the ability to make significant improvements in function in a reasonable and predictable amount of time.     Precautions / Restrictions Precautions Precautions: Fall;Other (comment) Precaution Comments: watch sats Restrictions Weight Bearing Restrictions: No      Mobility  Bed Mobility               General bed mobility comments: in chair on arrival and end of session    Transfers Overall transfer level: Needs assistance   Transfers: Sit to/from Stand Sit to Stand: Supervision           General transfer comment: pt able to stand to and from recliner and to and from toilet without assist to  rise    Ambulation/Gait Ambulation/Gait assistance: Contact guard assist Gait Distance (Feet): 15 Feet Assistive device: Rolling walker (2 wheels) Gait Pattern/deviations: Step-through pattern, Decreased stride length, Trunk flexed   Gait velocity interpretation: 1.31 - 2.62 ft/sec, indicative of limited community ambulator   General Gait Details: cues for posture and proximity to RW. initial 15' to bathroom pt able to walk on 2L with SPO2 90-93% with pt stating SOB. Return trial on 3L pt with desaturation to 88% and 15 sec seated rest to recover to 92%  Stairs            Wheelchair Mobility     Tilt Bed    Modified Rankin (Stroke Patients Only)       Balance Overall balance assessment: Needs assistance Sitting-balance support: No upper extremity supported, Feet supported Sitting balance-Leahy Scale: Good     Standing balance support: During functional activity, Reliant on assistive device for balance, Single extremity supported Standing balance-Leahy Scale: Poor Standing balance comment: pt able to stand with single UE support for pericare at toilet, RW for gait                             Pertinent Vitals/Pain Pain Assessment Pain Assessment: No/denies pain    Home Living Family/patient expects to be discharged to:: Private residence Living Arrangements: Children Available Help at Discharge: Available 24 hours/day Type of Home: House Home Access: Stairs to enter Entrance Stairs-Rails: Can reach both;Left;Right Entrance Stairs-Number of Steps: 4   Home Layout:  One level Home Equipment: Agricultural consultant (2 wheels);Cane - single point Additional Comments: home O2 2Ls day and night    Prior Function Prior Level of Function : Independent/Modified Independent             Mobility Comments: uses cane at times ADLs Comments: Independent taking sponge baths, toileting and dressing     Extremity/Trunk Assessment   Upper Extremity  Assessment Upper Extremity Assessment: Generalized weakness    Lower Extremity Assessment Lower Extremity Assessment: Generalized weakness    Cervical / Trunk Assessment Cervical / Trunk Assessment: Kyphotic  Communication   Communication Communication: No apparent difficulties  Cognition Arousal: Alert Behavior During Therapy: WFL for tasks assessed/performed Overall Cognitive Status: Within Functional Limits for tasks assessed                                          General Comments      Exercises     Assessment/Plan    PT Assessment Patient needs continued PT services  PT Problem List Decreased activity tolerance;Decreased balance;Cardiopulmonary status limiting activity;Decreased knowledge of use of DME;Decreased mobility       PT Treatment Interventions DME instruction;Gait training;Stair training;Functional mobility training;Therapeutic activities;Patient/family education;Therapeutic exercise    PT Goals (Current goals can be found in the Care Plan section)  Acute Rehab PT Goals Patient Stated Goal: return home, be able to move without being so SOB PT Goal Formulation: With patient/family Time For Goal Achievement: 10/23/22 Potential to Achieve Goals: Fair    Frequency Min 1X/week     Co-evaluation               AM-PAC PT "6 Clicks" Mobility  Outcome Measure Help needed turning from your back to your side while in a flat bed without using bedrails?: A Little Help needed moving from lying on your back to sitting on the side of a flat bed without using bedrails?: A Little Help needed moving to and from a bed to a chair (including a wheelchair)?: A Little Help needed standing up from a chair using your arms (e.g., wheelchair or bedside chair)?: A Little Help needed to walk in hospital room?: A Little Help needed climbing 3-5 steps with a railing? : A Lot 6 Click Score: 17    End of Session Equipment Utilized During Treatment:  Oxygen Activity Tolerance: Patient tolerated treatment well Patient left: in chair;with call bell/phone within reach;with chair alarm set;with nursing/sitter in room Nurse Communication: Mobility status PT Visit Diagnosis: Other abnormalities of gait and mobility (R26.89)    Time: 4098-1191 PT Time Calculation (min) (ACUTE ONLY): 17 min   Charges:   PT Evaluation $PT Eval Moderate Complexity: 1 Mod   PT General Charges $$ ACUTE PT VISIT: 1 Visit         Caitlyn Aguirre, PT Acute Rehabilitation Services Office: [phone]   Caitlyn Aguirre 10/09/2022, 1:42 PM
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
## 2022-10-09 NOTE — ED Notes (Signed)
Patient complaining of neck pain and a headache

## 2022-10-09 NOTE — Plan of Care (Signed)

## 2022-10-09 NOTE — Consult Note (Addendum)
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 restriction after discharge. - Will plan for [facility] on Monday.  Hypertension BP mildly elevated at times. - Only on Amlodipine 5mg  daily at home. Will stop this and add GDMT as above.  Hyperlipidemia - Continue Lipitor 40mg  daily.  CKD Stage III Creatinine 1.42 on admission and 1.47 today. Baseline around 1.2 to 1.4. - Continue to monitor with diuresis.  Otherwise, per primary team: - Acute on chronic hypoxic respiratory failure on home O2 - COPD - Fibrotic lung disease - Type 2 diabetes mellitus - Chronic anemia  Risk Assessment/Risk Scores:    New York Heart Association (NYHA) Functional Class NYHA Class III  For questions or updates, please contact [facility] Please consult [email or web address] for contact info under  Signed, Smitty Knudsen  10/09/2022 2:31 PM  Patient seen and examined, note reviewed with the signed Advanced Practice Provider. I personally reviewed laboratory data,  imaging studies and relevant notes. I independently examined the patient and formulated the important aspects  of the plan. I have personally discussed the plan with the patient and/or family. Comments or changes to the note/plan are indicated below.  Acute on Chronic Heart failure with reduced ejection fraction  Dilated cardiomyopathy Hypertension  Hyperlipidemia  CKD stage III Type 2 Diabetes Mellitus    Has had previous normal ejection fraction April 2023, EF has not decreased back to 3035 with global hypokinesis.  Will start guideline directed medical therapy.  Entresto 24-26 mg twice daily, start bisoprolol 2.5 mg daily.  Eventually the hospitalization will add Aldactone as well as SGLT2 inhibitors.  I do think it is best to get an ischemic evaluation as well as a right heart catheterization to understand right heart pressures given her pulmonary fibrosis.  The patient understands that risks include but are not limited to stroke (1 in 1000), death (1 in 1000), kidney failure [usually temporary] (1 in 500), bleeding (1 in 200), allergic reaction [possibly serious] (1 in 200), and agrees to proceed.   Thomasene Ripple DO, MS [facility] Attending Cardiologist [facility]  [address] [place], [place] [address] [phone] Website: [email or web address]

## 2022-10-09 NOTE — Progress Notes (Signed)
Asked by NT to come to room to assess patient due to [facility]. Patient states " she felt dizzy and SHOB when waking up". Denies chest pain. Patient is diaphoretic. Vital signs - 91/68 MAP- 77, HR- 82, Resp- 30. Denies chest pain. Patient also diaphoretic. Patient able to calm down and states " dizziness is gone". Paged Night shift intern on call and also sent secure chat to day shift intern. Waiting on return phone call.

## 2022-10-09 NOTE — Progress Notes (Addendum)
PT Cancellation Note  Patient Details Name: Caitlyn Aguirre MRN: [id number] DOB: August 18, 1935   Cancelled Treatment:    Reason Eval/Treat Not Completed: Patient at procedure or test/unavailable 0957  2nd attempt 1132 with OT   Elishah Ashmore B Daielle Melcher 10/09/2022, 9:57 AM Merryl Hacker, PT Acute Rehabilitation Services Office: [phone]

## 2022-10-09 NOTE — Progress Notes (Addendum)
Subjective:  Caitlyn Aguirre is a 87 y.o. person living with a history of Grade I diastolic dysfunction with LVEF 55-60%, COPD adherent to tiotropium and albuteral, ILD on chronic 2L supplemental oxygen, HTN, CKD stage 3a, T2DM, MDD, Polymyalgia rheumatica, pulmonary hypertension on CT and TTE who presented with shortness of breath and admitted for Acute on chronic hypoxic respiratory failure on hospital day 0. SOB and LE edema have worsened since Monday. Also endorses lightheadedness and orthopnea. Also Negative ROS, She denies CP, palpitations, worsening of chronic cough, fever, chills, URI symptoms. Recently was at a pulmonary clinic for worsening dyspnea and ILD changes on CT and over the last 3 months has gone from occasional 2L to constant.   She recently started Remeron 7.5 and has had improved sleep and appetite and been eating [employer] daily. Also had some behavioral changes on Remeron including disinhibition, agitation, and restlessness. Remeron was stopped.   Objective:  Vital signs in last 24 hours: Vitals:   10/09/22 0428 10/09/22 0500 10/09/22 0849 10/09/22 1238  BP: 134/76  (!) 150/67 (!) 148/80  Pulse: 92  92 93  Resp: 18  19 19   Temp: 98.8 F (37.1 C)  97.6 F (36.4 C) (!) 97.5 F (36.4 C)  TempSrc:   Oral Oral  SpO2: 100%  98% 96%  Weight:  85.7 kg     Physical Exam Constitutional:      Appearance: She is well-developed.  Cardiovascular:     Rate and Rhythm: Normal rate and regular rhythm.     Heart sounds: No murmur heard.    No friction rub. No gallop.     Comments: JVP mildly elevated with hepatojugular reflex. Trace BLE edema Pulmonary:     Effort: Pulmonary effort is normal.     Breath sounds: Examination of the right-lower field reveals wheezing and rales. Examination of the left-lower field reveals wheezing and rales. Wheezing and rales present.  Abdominal:     General: Bowel sounds are normal.  Neurological:     General: No focal deficit present.      Mental Status: She is alert.  Psychiatric:        Mood and Affect: Mood normal. Mood is not anxious.        Behavior: Behavior normal. Behavior is not agitated.    Labs/imaging  CBC hemoglobin 8.2 (9.4 three days ago) Normal WBC  CMP Elevated Cr 1.42 (baseline 1.2-.3)  BNP 512  Troponin negative Resp panel negative  Ferritin, iron, TIBC: low free iron 20, normal TIBC  Fecal occult blood pending UA w/ reflex: normal, SG 1.006  TTE: LVEF 30-35% (last yr 55-60%). LV global hypokinesis and grade I diastolic dysfunction. Elevated pulm artery pressure.   Assessment/Plan:  Principal Problem:   Acute on chronic respiratory failure (HCC) Active Problems:   COPD (HCC)   CKD (chronic kidney disease) stage 3, GFR 30-59 ml/min (HCC)   ILD (interstitial lung disease) (HCC)   Hearing loss  Caitlyn Aguirre is a 87 y.o. person living with a history of Grade I diastolic dysfunction with LVEF 55-60%, COPD, ILD on chronic 2L supplemental oxygen, HTN, CKD stage 3a, T2DM, MDD, Polymyalgia rheumatica, pulmonary hypertension on CT and TTE who presented with shortness of breath and admitted for Acute on chronic hypoxic respiratory failure on hospital day 0.   #Acute on Chronic Hypoxic Respiratory Failure Resolving  #HFrEF Exacerbation #Combined COPD and ILD #Pulmonary HTN SOB, LE edema, JVP, elevated BNP (500s), Hx of grade I diastolic dysfunction, her  presentation is likely due to an HFpEF exacerbation. Also had bojangles Multifactorial possibly w/ ILD worsening Ddx Pneumonia / COPD - no change chronic cough, enies sputum prod, fever/chills, sick contacts, no consolidation CXR PE - no travel/surgery, PE wells w/ low risk No superimposed pneumonia seen on CXR, leukocytosis, and rapid improvement in oxygenation with home 2L [place] on admission. Pulmonary HTN also likely contributing; patient has been referred to cardiology for cardiac evaluation. Will obtain TTE during this admission. However, if  worsening decline in respiratory status and unchanged TTE, would consider repeat CT vs antibiotic coverage given severity of her disease.    Plan: -given lasix 40 mg, give another 40 mg today -consult to cardiology, workup etiology of decreased EF, concern for ischemic cause - TTE showed new reduced EF.  -Strict Is and Os, standing weights -Yulperi nebulizer, Levalbuterol q8 prn  -O2 saturation parameters 89-94% -rehab consult as needed for pulmonary rehab   #Chronic Iron-deficiency Anemia  Her hemoglobin has decreased from 9.4 on 10/06/22 to 8.2 today and could be contributing to the SOB. She denies any signs of overt blood loss. Microcytic and iron studies consistent with iron deficiency anemia. Ganzoni equation suggests 1800 mg deficit.  Plan: -started iron sucrose 500 mg IV daily x3 doses -Transfuse if Hgb<7   #CKD Stage 3a #Hyperchloremic NAGMA Serum creatine today is mildly elevated from 1.24 07/2022 to 1.42:Not an AKI yet, will bladder scan to ensure she is not retaining urine. Suspect NAGMA in setting of RTA type 4 with prior hx of hyperkalemia, prior U/A with pH <5.  Plan: -Avoid nephrotoxic agents -Follow up on morning RFP -Follow up on bladder scan  -Follow up U/A with reflex microscopy   #T2DM On a home regimen of Metformin 1,000 mg daily. Last A1c was 6.6 in June 2024.  Plan:  -Begin SSI and cBG monitoring -Hold metformin    #MDD Patient was recently started on Remeron to increase appetite but was discontinued today due to daughter concerns of change in personality. The patient was expressing appropriate thought content and organization of ideas, responding to questions appropriately, and there was no pressured or tangential speech. To me, she did not seem agitated or fidgety.   Plan: -Continue Paxil 30 mg  -Holding Remeron   #HTN Holding home regimen of amlodipine 5mg     #GERD Continue home regimen of Protonix 40mg     #Hypothyroidism Continue home regimen of  Levothyroxine   Diet: Heart Healthy VTE: Enoxaparin Code: Full   Prior to Admission Living Arrangement: Home, living with daughter Caitlyn Aguirre  Anticipated Discharge Location: Home Barriers to Discharge: Medical Treatment    Dispo: Admit patient to Observation with expected length of stay less than 2 midnights.  Meryl Dare, MD 10/09/2022, 3:09 PM Contact: secure chat monitored before 5p OR call [phone] After 5pm on weekdays and 1pm on weekends: On Call pager [phone]

## 2022-10-09 NOTE — Progress Notes (Signed)
Patient weaned off BiPAP to 2L [place] (home regimen),  Patient VSS.  RT will continue to monitor.

## 2022-10-09 NOTE — Evaluation (Signed)
Occupational Therapy Evaluation Patient Details Name: Caitlyn Aguirre MRN: [id number] DOB: July 19, 1935 Today's Date: 10/09/2022   History of Present Illness 87 yo female admitted 9/5 with SOB and LB edema with CHF exacerbation. PMhx: CHF, LVEF 55-60%, COPD, ILD on chronic 2L O2, HTN, CKD, T2DM, MDD, Polymyalgia rheumatica, pulmonary hypertension   Clinical Impression   Pt currently at min guard assist for toilet transfers and LB selfcare sit to stand with use of the RW for support.  Oxygen sats at 92% on 2Ls at rest decreasing to 87% with mobility.  It took approximately 30 seconds or more to increase back to above 90%.  Dyspnea 3/4 after ambulating to the bathroom and back to the bedside chair.  Pt lives with her daughter who can provide 24 hour supervision as needed but pt was independent/modified prior to admission for most ADLs.  Feel she will benefit from acute care OT to help increase ADL independence and provide education on DME and energy conservation for or return home at a safer level.         If plan is discharge home, recommend the following: A little help with walking and/or transfers;A little help with bathing/dressing/bathroom;Assistance with cooking/housework;Assist for transportation;Help with stairs or ramp for entrance;Direct supervision/assist for medications management    Functional Status Assessment  Patient has had a recent decline in their functional status and demonstrates the ability to make significant improvements in function in a reasonable and predictable amount of time.  Equipment Recommendations  BSC/3in1;Tub/shower bench       Precautions / Restrictions Precautions Precautions: Fall Restrictions Weight Bearing Restrictions: No      Mobility Bed Mobility Overal bed mobility: Needs Assistance Bed Mobility: Supine to Sit     Supine to sit: Supervision, HOB elevated (approximately 25 degrees)          Transfers Overall transfer level: Needs  assistance Equipment used: Rolling walker (2 wheels) Transfers: Sit to/from Stand, Bed to chair/wheelchair/BSC Sit to Stand: Contact guard assist     Step pivot transfers: Contact guard assist     General transfer comment: Slower sit to stand secondary to knee pain      Balance Overall balance assessment: Needs assistance Sitting-balance support: Single extremity supported Sitting balance-Leahy Scale: Good     Standing balance support: During functional activity, Reliant on assistive device for balance Standing balance-Leahy Scale: Poor Standing balance comment: Pt needs use of the RW for support with standing and mobility.                           ADL either performed or assessed with clinical judgement   ADL Overall ADL's : Needs assistance/impaired Eating/Feeding: Independent;Sitting   Grooming: Wash/dry hands;Sitting;Set up Grooming Details (indicate cue type and reason): simulated Upper Body Bathing: Set up;Sitting Upper Body Bathing Details (indicate cue type and reason): simulated Lower Body Bathing: Contact guard assist;Sit to/from stand Lower Body Bathing Details (indicate cue type and reason): simulated Upper Body Dressing : Set up;Sitting Upper Body Dressing Details (indicate cue type and reason): simulated Lower Body Dressing: Contact guard assist;Sit to/from stand   Toilet Transfer: Ambulation;Regular Toilet;Rolling walker (2 wheels);Contact guard assist;Grab bars Toilet Transfer Details (indicate cue type and reason): Increased difficulty for sit to stand from regular height toilet Toileting- Clothing Manipulation and Hygiene: Contact guard assist;Sit to/from stand;Minimal assistance       Functional mobility during ADLs: Contact guard assist;Rolling walker (2 wheels) General ADL Comments: Pt's O2 sats  at rest 92% on 2Ls nasal cannula.  They decreased to 86-87% with mobilty still on 2Ls with dsypnea at 3/4.  HR in the mid 80s.  Discussed benefit  of a tub bench for home as well as a 3:1 since her toilets are low and she is scared of falling stepping over in the tub.  Daughter can provide 24 hour supervision.     Vision Baseline Vision/History: 1 Wears glasses Ability to See in Adequate Light: 0 Adequate Patient Visual Report: No change from baseline Vision Assessment?: No apparent visual deficits     Perception Perception: Not tested       Praxis Praxis: Not tested       Pertinent Vitals/Pain Pain Assessment Pain Assessment: No/denies pain     Extremity/Trunk Assessment Upper Extremity Assessment Upper Extremity Assessment: Generalized weakness (shoulder strength and grip 3+/5, elbow flexion 4/5)   Lower Extremity Assessment Lower Extremity Assessment: Defer to PT evaluation   Cervical / Trunk Assessment Cervical / Trunk Assessment: Normal   Communication Communication Communication: No apparent difficulties   Cognition Arousal: Alert Behavior During Therapy: WFL for tasks assessed/performed Overall Cognitive Status: Within Functional Limits for tasks assessed                                                  Home Living Family/patient expects to be discharged to:: Private residence Living Arrangements: Children (lives with daughter) Available Help at Discharge: Available 24 hours/day Type of Home: House Home Access: Stairs to enter   Entrance Stairs-Rails: Can reach both;Left;Right Home Layout: One level     Bathroom Shower/Tub: Chief Strategy Officer: Standard     Home Equipment: Agricultural consultant (2 wheels);Cane - single point;Other (comment)   Additional Comments: home O2 2Ls day and night      Prior Functioning/Environment Prior Level of Function : Independent/Modified Independent             Mobility Comments: Used walker some of the times but other times did not use anything ADLs Comments: Independent taking sponge baths        OT Problem List:  Decreased strength;Decreased activity tolerance;Impaired balance (sitting and/or standing);Pain;Cardiopulmonary status limiting activity;Decreased knowledge of use of DME or AE      OT Treatment/Interventions: Self-care/ADL training;Patient/family education;Therapeutic exercise;Balance training;Therapeutic activities;Energy conservation;DME and/or AE instruction    OT Goals(Current goals can be found in the care plan section) Acute Rehab OT Goals Patient Stated Goal: Get her strength back OT Goal Formulation: With patient Time For Goal Achievement: 10/23/22 Potential to Achieve Goals: Good  OT Frequency: Min 1X/week       AM-PAC OT "6 Clicks" Daily Activity     Outcome Measure Help from another person eating meals?: None Help from another person taking care of personal grooming?: A Little Help from another person toileting, which includes using toliet, bedpan, or urinal?: A Little Help from another person bathing (including washing, rinsing, drying)?: A Little Help from another person to put on and taking off regular upper body clothing?: A Little Help from another person to put on and taking off regular lower body clothing?: A Little 6 Click Score: 19   End of Session Equipment Utilized During Treatment: Gait belt;Rolling walker (2 wheels) Nurse Communication: Mobility status  Activity Tolerance: Patient limited by fatigue Patient left: in chair;with call bell/phone within reach;with chair  alarm set  OT Visit Diagnosis: Unsteadiness on feet (R26.81);Other abnormalities of gait and mobility (R26.89);Muscle weakness (generalized) (M62.81);Pain Pain - Right/Left: Left Pain - part of body: Knee                Time: 1129-1208 OT Time Calculation (min): 39 min Charges:  OT General Charges $OT Visit: 1 Visit OT Evaluation $OT Eval Moderate Complexity: 1 Mod OT Treatments $Self Care/Home Management : 23-37 mins Perrin Maltese, OTR/L Acute Rehabilitation Services  Office  [phone] 10/09/2022

## 2022-10-10 DIAGNOSIS — I5022 Chronic systolic (congestive) heart failure: Secondary | ICD-10-CM

## 2022-10-10 DIAGNOSIS — N179 Acute kidney failure, unspecified: Secondary | ICD-10-CM | POA: Diagnosis not present

## 2022-10-10 DIAGNOSIS — J849 Interstitial pulmonary disease, unspecified: Secondary | ICD-10-CM

## 2022-10-10 DIAGNOSIS — I509 Heart failure, unspecified: Secondary | ICD-10-CM | POA: Diagnosis not present

## 2022-10-10 DIAGNOSIS — I5043 Acute on chronic combined systolic (congestive) and diastolic (congestive) heart failure: Secondary | ICD-10-CM

## 2022-10-10 DIAGNOSIS — D509 Iron deficiency anemia, unspecified: Secondary | ICD-10-CM | POA: Insufficient documentation

## 2022-10-10 DIAGNOSIS — J9621 Acute and chronic respiratory failure with hypoxia: Secondary | ICD-10-CM | POA: Diagnosis not present

## 2022-10-10 LAB — CBC
HCT: 27.9 % — ABNORMAL LOW (ref 36.0–46.0)
Hemoglobin: 8.6 g/dL — ABNORMAL LOW (ref 12.0–15.0)
MCH: 23.4 pg — ABNORMAL LOW (ref 26.0–34.0)
MCHC: 30.8 g/dL (ref 30.0–36.0)
MCV: 75.8 fL — ABNORMAL LOW (ref 80.0–100.0)
Platelets: 232 10*3/uL (ref 150–400)
RBC: 3.68 MIL/uL — ABNORMAL LOW (ref 3.87–5.11)
RDW: 19.6 % — ABNORMAL HIGH (ref 11.5–15.5)
WBC: 8.6 10*3/uL (ref 4.0–10.5)
nRBC: 0 % (ref 0.0–0.2)

## 2022-10-10 LAB — GLUCOSE, CAPILLARY
Glucose-Capillary: 169 mg/dL — ABNORMAL HIGH (ref 70–99)
Glucose-Capillary: 127 mg/dL — ABNORMAL HIGH (ref 70–99)
Glucose-Capillary: 131 mg/dL — ABNORMAL HIGH (ref 70–99)
Glucose-Capillary: 131 mg/dL — ABNORMAL HIGH (ref 70–99)
Glucose-Capillary: 145 mg/dL — ABNORMAL HIGH (ref 70–99)
Glucose-Capillary: 127 mg/dL — ABNORMAL HIGH (ref 70–99)

## 2022-10-10 LAB — BASIC METABOLIC PANEL
Anion gap: 11 (ref 5–15)
BUN: 29 mg/dL — ABNORMAL HIGH (ref 8–23)
CO2: 21 mmol/L — ABNORMAL LOW (ref 22–32)
Calcium: 9.9 mg/dL (ref 8.9–10.3)
Chloride: 107 mmol/L (ref 98–111)
Creatinine, Ser: 1.65 mg/dL — ABNORMAL HIGH (ref 0.44–1.00)
GFR, Estimated: 30 mL/min — ABNORMAL LOW (ref >60)
Glucose, Bld: 134 mg/dL — ABNORMAL HIGH (ref 70–99)
Potassium: 4.8 mmol/L (ref 3.5–5.1)
Sodium: 139 mmol/L (ref 135–145)

## 2022-10-10 LAB — MAGNESIUM: Magnesium: 1.5 mg/dL — ABNORMAL LOW (ref 1.7–2.4)

## 2022-10-10 MED ORDER — ENOXAPARIN SODIUM 30 MG/0.3ML IJ SOSY
30.0000 mg | PREFILLED_SYRINGE | INTRAMUSCULAR | Status: DC
  Administered 2022-10-11 – 2022-10-15 (×4): 30 mg via SUBCUTANEOUS
  Filled 2022-10-10 (×4): qty 0.3

## 2022-10-10 MED ORDER — IRON SUCROSE 500 MG IVPB - SIMPLE MED
500.0000 mg | Freq: Once | INTRAVENOUS | Status: AC
  Administered 2022-10-10: 500 mg via INTRAVENOUS
  Filled 2022-10-10: qty 275

## 2022-10-10 MED ORDER — MAGNESIUM SULFATE 2 GM/50ML IV SOLN
2.0000 g | Freq: Once | INTRAVENOUS | Status: AC
  Administered 2022-10-10: 2 g via INTRAVENOUS
  Filled 2022-10-10: qty 50

## 2022-10-10 NOTE — Progress Notes (Signed)
PT noted with 2nd similar episode , while going to the bathroom with walker, make it bathroom door, (place her on bedside commode pt had a BM) per pt felt dizzy, felt like I'm going to pass out. B/P 98/66, map 77, O2 95%VIA 2L [place], BS 182. Pt cried said "she's nervous something going on".  Faith Rogue DO and Morene Crocker MD notified.

## 2022-10-10 NOTE — Progress Notes (Signed)
Rounding Note    Patient Name: Caitlyn Aguirre Date of Encounter: 10/10/2022  [facility] Cardiologist: None   Subjective   BP 105/64 this morning.  Worsening renal function (1.47 > 1.65).  I/os not recorded.  Denies any chest pain.  Reports continues to have some shortness of breath  Inpatient Medications    Scheduled Meds:  atorvastatin  40 mg Oral Daily   bisoprolol  2.5 mg Oral Daily   enoxaparin (LOVENOX) injection  40 mg Subcutaneous Q24H   insulin aspart  0-5 Units Subcutaneous QHS   insulin aspart  0-9 Units Subcutaneous TID WC   levothyroxine  75 mcg Oral Daily   pantoprazole  40 mg Oral Daily   PARoxetine  30 mg Oral Daily   revefenacin  175 mcg Nebulization Daily   sacubitril-valsartan  1 tablet Oral BID   senna-docusate  1 tablet Oral QHS   Continuous Infusions:  iron sucrose     PRN Meds: acetaminophen **OR** acetaminophen, levalbuterol   Vital Signs    Vitals:   10/10/22 0248 10/10/22 0500 10/10/22 0742 10/10/22 0823  BP: (!) 159/90  105/64   Pulse: 87  72 68  Resp:    16  Temp: 97.8 F (36.6 C)     TempSrc: Oral     SpO2: 96%  98% 94%  Weight:  85.2 kg      Intake/Output Summary (Last 24 hours) at 10/10/2022 1151 Last data filed at 10/10/2022 1043 Gross per 24 hour  Intake 360 ml  Output 481 ml  Net -121 ml      10/10/2022    5:00 AM 10/09/2022    5:00 AM 10/06/2022   10:55 AM  Last 3 Weights  Weight (lbs) 187 lb 13.3 oz 188 lb 15 oz 184 lb  Weight (kg) 85.2 kg 85.7 kg 83.462 kg      Telemetry    Normal sinus rhythm- Personally Reviewed  ECG    No new ECG- Personally Reviewed  Physical Exam   GEN: No acute distress.   Neck: + JVD Cardiac: RRR, no murmurs, rubs, or gallops.  Respiratory: Scattered wheezing GI: Soft, nontender, non-distended  MS: No edema; No deformity. Neuro:  Nonfocal  Psych: Normal affect   Labs    High Sensitivity Troponin:   Recent Labs  Lab 10/08/22 2237 10/09/22 0035  TROPONINIHS 15 15      Chemistry Recent Labs  Lab 10/06/22 1251 10/08/22 2237 10/09/22 0755 10/10/22 0852  NA  --  143 141 139  K  --  4.4 4.5 4.8  CL  --  112* 111 107  CO2  --  20* 23 21*  GLUCOSE  --  172* 137* 134*  BUN  --  16 17 29*  CREATININE  --  1.42* 1.47* 1.65*  CALCIUM  --  10.0 10.0 9.9  MG  --   --  1.3* 1.5*  PROT 7.6 6.8  --   --   ALBUMIN 4.0 3.3* 3.1*  --   AST 16 27  --   --   ALT 9 17  --   --   ALKPHOS 100 96  --   --   BILITOT 0.8 0.8  --   --   GFRNONAA  --  36* 35* 30*  ANIONGAP  --  11 7 11     Lipids No results for input(s): "CHOL", "TRIG", "HDL", "LABVLDL", "LDLCALC", "CHOLHDL" in the last 168 hours.  Hematology Recent Labs  Lab 10/08/22 2237  10/09/22 0755 10/10/22 0852  WBC 8.8 8.0 8.6  RBC 3.55* 3.45* 3.68*  HGB 8.2* 8.1* 8.6*  HCT 27.0* 26.2* 27.9*  MCV 76.1* 75.9* 75.8*  MCH 23.1* 23.5* 23.4*  MCHC 30.4 30.9 30.8  RDW 19.9* 19.9* 19.6*  PLT 246 214 232   Thyroid  Recent Labs  Lab 10/09/22 0749  TSH 1.491    BNP Recent Labs  Lab 10/06/22 1251 10/08/22 2237  BNP  --  512.5*  PROBNP 388.0*  --     DDimer No results for input(s): "DDIMER" in the last 168 hours.   Radiology    ECHOCARDIOGRAM COMPLETE  Result Date: 10/09/2022    ECHOCARDIOGRAM REPORT   Patient Name:   Caitlyn Aguirre Date of Exam: 10/09/2022 Medical Rec #:  [id number]      Height:       68.0 in Accession #:    [id number]     Weight:       188.9 lb Date of Birth:  Oct 12, 1935       BSA:          1.995 m Patient Age:    87 years       BP:           150/67 mmHg Patient Gender: F              HR:           77 bpm. Exam Location:  Inpatient Procedure: 2D Echo, Color Doppler, Cardiac Doppler and Intracardiac            Opacification Agent Indications:    Dyspnea R06.00  History:        Patient has prior history of Echocardiogram examinations, most                 recent 05/31/2021. CHF, Previous Myocardial Infarction, COPD and                 Stroke; Risk Factors:Former Smoker, Diabetes,  Hypertension and                 Sleep Apnea.  Sonographer:    Aron Baba Referring Phys: [id number] EMILY B MULLEN  Sonographer Comments: Image acquisition challenging due to respiratory motion. IMPRESSIONS  1. Left ventricular ejection fraction, by estimation, is 30 to 35%. The left ventricle has moderately decreased function. The left ventricle demonstrates global hypokinesis. Left ventricular diastolic parameters are consistent with Grade I diastolic dysfunction (impaired relaxation). There is the interventricular septum is flattened in systole and diastole, consistent with right ventricular pressure and volume overload.  2. Right ventricular systolic function is mildly reduced. The right ventricular size is normal. There is moderately elevated pulmonary artery systolic pressure. The estimated right ventricular systolic pressure is 47.4 mmHg.  3. The mitral valve is abnormal. Trivial mitral valve regurgitation.  4. The tricuspid valve is abnormal. Tricuspid valve regurgitation is mild to moderate.  5. The aortic valve is tricuspid. Aortic valve regurgitation is not visualized.  6. The pulmonic valve was abnormal.  7. The inferior vena cava is normal in size with <50% respiratory variability, suggesting right atrial pressure of 8 mmHg. Comparison(s): Changes from prior study are noted. 05/31/2021: LVEF 55-60%, RVSP 22.9 mmHg. FINDINGS  Left Ventricle: Left ventricular ejection fraction, by estimation, is 30 to 35%. The left ventricle has moderately decreased function. The left ventricle demonstrates global hypokinesis. Definity contrast agent was given IV to delineate the left ventricular endocardial borders. The left ventricular internal cavity size was  normal in size. There is no left ventricular hypertrophy. The interventricular septum is flattened in systole and diastole, consistent with right ventricular pressure and volume overload. Left ventricular diastolic parameters are consistent with Grade I diastolic  dysfunction (impaired relaxation). Indeterminate filling pressures. Right Ventricle: The right ventricular size is normal. No increase in right ventricular wall thickness. Right ventricular systolic function is mildly reduced. There is moderately elevated pulmonary artery systolic pressure. The tricuspid regurgitant velocity is 3.14 m/s, and with an assumed right atrial pressure of 8 mmHg, the estimated right ventricular systolic pressure is 47.4 mmHg. Left Atrium: Left atrial size was normal in size. Right Atrium: Right atrial size was normal in size. Pericardium: There is no evidence of pericardial effusion. Mitral Valve: The mitral valve is abnormal. Mild mitral annular calcification. Trivial mitral valve regurgitation. Tricuspid Valve: The tricuspid valve is abnormal. Tricuspid valve regurgitation is mild to moderate. Aortic Valve: The aortic valve is tricuspid. Aortic valve regurgitation is not visualized. Pulmonic Valve: The pulmonic valve was abnormal. Pulmonic valve regurgitation is mild to moderate. Aorta: The aortic root and ascending aorta are structurally normal, with no evidence of dilitation. Venous: The inferior vena cava is normal in size with less than 50% respiratory variability, suggesting right atrial pressure of 8 mmHg. IAS/Shunts: No atrial level shunt detected by color flow Doppler.  LEFT VENTRICLE PLAX 2D LVIDd:         4.70 cm      Diastology LVIDs:         4.60 cm      LV e' medial:    6.53 cm/s LV PW:         1.00 cm      LV E/e' medial:  6.0 LV IVS:        0.80 cm      LV e' lateral:   5.44 cm/s LVOT diam:     2.10 cm      LV E/e' lateral: 7.2 LV SV:         39 LV SV Index:   20 LVOT Area:     3.46 cm  LV Volumes (MOD) LV vol d, MOD A2C: 116.0 ml LV vol d, MOD A4C: 120.0 ml LV vol s, MOD A2C: 74.8 ml LV vol s, MOD A4C: 88.5 ml LV SV MOD A2C:     41.2 ml LV SV MOD A4C:     120.0 ml LV SV MOD BP:      34.5 ml RIGHT VENTRICLE RV S prime:     9.57 cm/s TAPSE (M-mode): 1.9 cm LEFT ATRIUM              Index        RIGHT ATRIUM           Index LA diam:        2.60 cm 1.30 cm/m   RA Area:     19.10 cm LA Vol (A2C):   15.5 ml 7.77 ml/m   RA Volume:   54.90 ml  27.52 ml/m LA Vol (A4C):   29.5 ml 14.79 ml/m LA Biplane Vol: 21.6 ml 10.83 ml/m  AORTIC VALVE             PULMONIC VALVE LVOT Vmax:   76.50 cm/s  PR End Diast Vel: 6.55 msec LVOT Vmean:  50.300 cm/s LVOT VTI:    0.113 m  AORTA Ao Root diam: 3.40 cm Ao Asc diam:  3.30 cm MITRAL VALVE  TRICUSPID VALVE MV Area (PHT): 5.75 cm     TR Peak grad:   39.4 mmHg MV Decel Time: 132 msec     TR Vmax:        314.00 cm/s MV E velocity: 39.30 cm/s MV A velocity: 121.00 cm/s  SHUNTS MV E/A ratio:  0.32         Systemic VTI:  0.11 m                             Systemic Diam: 2.10 cm Zoila Shutter MD Electronically signed by Zoila Shutter MD Signature Date/Time: 10/09/2022/11:06:21 AM    Final    DG Chest Portable 1 View  Result Date: 10/08/2022 CLINICAL DATA:  Shortness of breath EXAM: PORTABLE CHEST 1 VIEW COMPARISON:  09/26/2021, CT 08/13/2022 FINDINGS: Emphysema and chronic fibrotic lung disease and bronchiectasis. Mildly enlarged cardiomediastinal silhouette. No pleural effusion or pneumothorax. No acute superimposed confluent airspace disease. IMPRESSION: Emphysema and chronic fibrotic lung disease. No definite superimposed acute airspace disease Electronically Signed   By: Jasmine Pang M.D.   On: 10/08/2022 23:24    Cardiac Studies     Patient Profile     87 y.o. female with a history of normal coronaries on remote cardiac catheterization in 1999 and 2010, chronic HFrEF with EF as low as 35-40% in 1999 but has subsequently normalized, COPD and fibrotic lung disease with chronic hypoxic respiratory failure on 2 L of O2 at home, CVA, hypertension, hyperlipidemia, type 2 diabetes mellitus, hypothyroidism, chronic anemia,and PMR who is being seen 10/09/2022 for the evaluation of CHF   Assessment & Plan    Acute on Chronic  HFrEF Non-Ischemic Cardiomyopathy Patient has a history of nonischemic cardiomyopathy with EF as low as 35-40% back in 1999.  Cath at that time showed normal coronaries and she subsequently had normalization of EF. LVEF was 55-60% in 05/2021.  Patient now resents with acute respiratory failure initially requiring BiPAP.  BNP was elevated in the 500s.  Chest x-ray showed emphysema and chronic fibrotic lung disease but no acute findings.  Echo showed a drop in EF to 30-35% with global hypokinesis and grade 1 diastolic dysfunction as well as mildly reduced RV function with moderately elevated PASP of 47.4 mmHg and a flattened interventricular septum and systole and diastole consistent with RV pressure and volume overload.  She was started on IV Lasix but there has been no documented I's/ O's yet. - She has received 2 doses of IV Lasix so far and is feeling much better. She describes brisk urine output. Unfortunately, I don't have any documented output.  Worsening renal function, would hold Lasix for today -Hold Entresto given worsening renal function - Continue Bisoprolol 2.5mg  daily. - Monitor daily weights, strict I/O's, and renal function. Discussed importance of sodium restriction after discharge. - Will plan for [facility] on Monday.   Hypertension BP mildly elevated at times. - Only on Amlodipine 5mg  daily at home. Will stop this and add GDMT as above.   Hyperlipidemia - Continue Lipitor 40mg  daily.   AKI CKD Stage III Creatinine 1.42 on admission and 1.65 today. Baseline around 1.2 to 1.4. -Holding diuresis today   Otherwise, per primary team: - Acute on chronic hypoxic respiratory failure on home O2 - COPD - Fibrotic lung disease - Type 2 diabetes mellitus - Chronic anemia  For questions or updates, please contact [facility] Please consult [email or web address] for contact info under  Signed, Little Ishikawa, MD  10/10/2022, 11:51 AM

## 2022-10-10 NOTE — Plan of Care (Signed)

## 2022-10-10 NOTE — Progress Notes (Signed)
Subjective:  Caitlyn Aguirre is a 87 y.o. person living with a history of Grade I diastolic dysfunction with LVEF 55-60%, COPD, ILD on chronic 2L supplemental oxygen, HTN, CKD stage 3a, T2DM, MDD, Polymyalgia rheumatica, pulmonary hypertension on CT and TTE who presented with shortness of breath and admitted for Acute on chronic hypoxic respiratory failure due to most likely to HFpEF and possibly also ILD and pulmonary HTN. She received lasik 40 x2 with improvement in symptoms. Echo showed EF 30-35 (55-60). Cardiology consulted who started entresto and bisoprolol and planning L/R HC.   Overnight patient had woresening SOB, "dizzyness," hypotension, diaphoresis, and tachypnea. Had a bowel movement.   On rounds in the morning, pt reports continued symptoms with standing. Discussed that we will let cardiology make recommendations and that she will go for heart cath on Monday.    Objective:  Vital signs in last 24 hours: Vitals:   10/10/22 0248 10/10/22 0500 10/10/22 0742 10/10/22 0823  BP: (!) 159/90  105/64   Pulse: 87  72 68  Resp:    16  Temp: 97.8 F (36.6 C)     TempSrc: Oral     SpO2: 96%  98% 94%  Weight:  85.2 kg     Net output yesterday -250 mL.   Physical Exam Constitutional:      Appearance: She is well-developed.  HENT:     Mouth/Throat:     Comments: Oral mucosa dry per patient.  Cardiovascular:     Rate and Rhythm: Normal rate and regular rhythm.     Heart sounds: No murmur heard.    No friction rub. No gallop.     Comments: JVP mildly elevated with hepatojugular reflex. Trace BLE edema Pulmonary:     Effort: Pulmonary effort is normal.     Breath sounds: Examination of the right-lower field reveals rales. Examination of the left-lower field reveals rales. Rales present. No wheezing.  Abdominal:     General: Bowel sounds are normal.  Skin:    General: Skin is warm.     Capillary Refill: Capillary refill takes more than 3 seconds.  Neurological:     General:  No focal deficit present.     Mental Status: She is alert.  Psychiatric:        Mood and Affect: Mood normal. Mood is not anxious.        Behavior: Behavior normal. Behavior is not agitated.    Labs/imaging hemoglobin 9.4->8.2-> 8.6 Normal WBC  Elevated Cr (baseline 1.2-.3): 1.42 admission -> 1.65  Fecal occult blood still pending  Assessment/Plan:  Principal Problem:   Acute on chronic respiratory failure (HCC) Active Problems:   COPD (HCC)   AKI (acute kidney injury) (HCC)   CKD (chronic kidney disease) stage 3, GFR 30-59 ml/min (HCC)   ILD (interstitial lung disease) (HCC)   Hearing loss   Iron deficiency anemia   Acute on chronic combined systolic and diastolic CHF (congestive heart failure) (HCC)  Caitlyn Aguirre is a 87 y.o. person living with a history of Grade I diastolic dysfunction with LVEF 55-60%, COPD, ILD on chronic 2L supplemental oxygen, HTN, CKD stage 3a, T2DM, MDD, Polymyalgia rheumatica, pulmonary hypertension on CT and TTE who presented with shortness of breath and admitted for Acute on chronic hypoxic respiratory failure on hospital day 2.  #Acute on Chronic Hypoxic Respiratory Failure Resolving  #HFrEF Exacerbation #Combined COPD and ILD #Pulmonary HTN SOB, LE edema, JVP, elevated BNP (500s), Hx of grade I diastolic dysfunction, her  presentation is likely due to an HFpEF exacerbation. Also had bojangles Multifactorial possibly w/ ILD worsening  Ddx Pneumonia + COPD - no change chronic cough, enies sputum prod, fever/chills, sick contacts, no consolidation CXR PE - no travel/surgery, PE wells w/ low risk Pulmonary HTN - likely contributing  9/7: now HFrEF. Symptoms worsening yesterday evening but more positional now: may be hypovolemia v ILD worsening v medication-related. I&O net - after total lasik 80 mg over 24hrs. Volume status on exam is hypervolemia but possible worsening kidney function, although could be related to starting enstresto.    Plan: -cardiology following -GDMT: continue bisoprolol but hold entrestro (add spironolactone/SGLT2 later) -hold lasix today -R/L HC: schedule for Monday -Strict Is and Os, standing weights -Yulperi nebulizer, Levalbuterol q8 prn  -O2 saturation parameters 89-94% -rehab consult as needed for pulmonary rehab    #Chronic Iron-deficiency Anemia  Her hemoglobin has decreased from 9.4 on 10/06/22 to 8.2 today and could be contributing to the SOB. She denies any signs of overt blood loss. Microcytic and iron studies consistent with iron deficiency anemia. Ganzoni equation suggests 1800 mg deficit.  Plan: -started iron sucrose 500 mg IV daily x3 doses. Repeat iron today. Repeat 2 more times.  -Transfuse if Hgb<7   #CKD Stage 3a #Hyperchloremic NAGMA Serum creatine today is mildly elevated from 1.24 07/2022 to 1.42 to 1.6 today. UA unremarkable.  Plan: -hold entresto. No diureses today -Avoid nephrotoxic agents -Follow up on bladder scan  -Follow up U/A with reflex microscopy   #T2DM On a home regimen of Metformin 1,000 mg daily. Last A1c was 6.6 in June 2024.  Plan:  -Begin SSI and cBG monitoring -Hold metformin    #MDD Patient was recently started on Remeron to increase appetite but was discontinued today due to daughter concerns of change in personality. The patient was expressing appropriate thought content and organization of ideas, responding to questions appropriately, and there was no pressured or tangential speech. To me, she did not seem agitated or fidgety.   Plan: -Continue Paxil 30 mg  -Holding Remeron   #HTN Holding home regimen of amlodipine 5mg     #GERD Continue home regimen of Protonix 40mg     #Hypothyroidism Continue home regimen of Levothyroxine   #Hypomagnesemia 1.6, giving 2g mag sulfate  Diet: Heart Healthy VTE: Enoxaparin Code: Full   Prior to Admission Living Arrangement: Home, living with daughter Elease Hashimoto  Anticipated Discharge Location:  Home Barriers to Discharge: Medical Treatment    Dispo: Admit patient to Observation with expected length of stay less than 2 midnights.  Meryl Dare, MD 10/10/2022, 12:41 PM Pager: [phone] After 5pm on weekdays and 1pm on weekends: On Call pager [phone]

## 2022-10-10 NOTE — Plan of Care (Signed)

## 2022-10-11 DIAGNOSIS — J9621 Acute and chronic respiratory failure with hypoxia: Secondary | ICD-10-CM | POA: Diagnosis not present

## 2022-10-11 DIAGNOSIS — I5043 Acute on chronic combined systolic (congestive) and diastolic (congestive) heart failure: Secondary | ICD-10-CM | POA: Diagnosis not present

## 2022-10-11 DIAGNOSIS — N179 Acute kidney failure, unspecified: Secondary | ICD-10-CM | POA: Diagnosis not present

## 2022-10-11 LAB — CBC
HCT: 27.8 % — ABNORMAL LOW (ref 36.0–46.0)
Hemoglobin: 8.6 g/dL — ABNORMAL LOW (ref 12.0–15.0)
MCH: 23.1 pg — ABNORMAL LOW (ref 26.0–34.0)
MCHC: 30.9 g/dL (ref 30.0–36.0)
MCV: 74.7 fL — ABNORMAL LOW (ref 80.0–100.0)
Platelets: 241 10*3/uL (ref 150–400)
RBC: 3.72 MIL/uL — ABNORMAL LOW (ref 3.87–5.11)
RDW: 19.7 % — ABNORMAL HIGH (ref 11.5–15.5)
WBC: 7.8 10*3/uL (ref 4.0–10.5)
nRBC: 0.4 % — ABNORMAL HIGH (ref 0.0–0.2)

## 2022-10-11 LAB — RENAL FUNCTION PANEL
Albumin: 3 g/dL — ABNORMAL LOW (ref 3.5–5.0)
Anion gap: 11 (ref 5–15)
BUN: 27 mg/dL — ABNORMAL HIGH (ref 8–23)
CO2: 21 mmol/L — ABNORMAL LOW (ref 22–32)
Calcium: 9.6 mg/dL (ref 8.9–10.3)
Chloride: 107 mmol/L (ref 98–111)
Creatinine, Ser: 1.55 mg/dL — ABNORMAL HIGH (ref 0.44–1.00)
GFR, Estimated: 33 mL/min — ABNORMAL LOW (ref >60)
Glucose, Bld: 123 mg/dL — ABNORMAL HIGH (ref 70–99)
Phosphorus: 3.3 mg/dL (ref 2.5–4.6)
Potassium: 4.1 mmol/L (ref 3.5–5.1)
Sodium: 139 mmol/L (ref 135–145)

## 2022-10-11 LAB — GLUCOSE, CAPILLARY
Glucose-Capillary: 171 mg/dL — ABNORMAL HIGH (ref 70–99)
Glucose-Capillary: 145 mg/dL — ABNORMAL HIGH (ref 70–99)
Glucose-Capillary: 195 mg/dL — ABNORMAL HIGH (ref 70–99)

## 2022-10-11 LAB — MAGNESIUM: Magnesium: 1.8 mg/dL (ref 1.7–2.4)

## 2022-10-11 MED ORDER — ASPIRIN 81 MG PO CHEW
81.0000 mg | CHEWABLE_TABLET | ORAL | Status: AC
  Administered 2022-10-12: 81 mg via ORAL
  Filled 2022-10-11: qty 1

## 2022-10-11 MED ORDER — SODIUM CHLORIDE 0.9 % IV SOLN: INTRAVENOUS | Status: DC

## 2022-10-11 NOTE — Progress Notes (Signed)
Subjective Patient states she has been increasingly light-headed and "vision goes black" when getting up to use bedside urinal.   Physical exam Blood pressure 105/62, pulse 67, temperature 97.9 F (36.6 C), temperature source Oral, resp. rate 18, weight 84.9 kg, SpO2 98%.  Physical Exam: Constitutional: On 2L [place], lying in bed, in no acute distress Cardiovascular: regular rate and rhythm, no m/r/g Pulmonary/Chest: mildly increased work of breathing on 2 L [place], coarse crackles bilaterally  Skin: warm and dry Psych: normal mood and behavior   Weight change: -0.3 kg   Intake/Output Summary (Last 24 hours) at 10/11/2022 1744 Last data filed at 10/11/2022 1500 Gross per 24 hour  Intake 240 ml  Output 700 ml  Net -460 ml   Net IO Since Admission: -581 mL [10/11/22 1744]  Labs, images, and other studies    Latest Ref Rng & Units 10/11/2022    7:20 AM 10/10/2022    8:52 AM 10/09/2022    7:55 AM  BMP  Glucose 70 - 99 mg/dL 962  952  841   BUN 8 - 23 mg/dL 27  29  17    Creatinine 0.44 - 1.00 mg/dL 3.24  4.01  0.27   Sodium 135 - 145 mmol/L 139  139  141   Potassium 3.5 - 5.1 mmol/L 4.1  4.8  4.5   Chloride 98 - 111 mmol/L 107  107  111   CO2 22 - 32 mmol/L 21  21  23    Calcium 8.9 - 10.3 mg/dL 9.6  9.9  25.3      Assessment and plan Hospital day 3  Caitlyn Aguirre is a 87 y.o. person living with a history of Grade I diastolic dysfunction with LVEF 30-35% (10/2022), COPD, ILD on chronic 2L supplemental oxygen, HTN, CKD stage 3a, T2DM, MDD, Polymyalgia rheumatica, pulmonary hypertension on CT and TTE who presented with shortness of breath and admitted for Acute on chronic hypoxic respiratory failure on hospital day 3.    Principal Problem:   Acute on chronic respiratory failure (HCC) Active Problems:   COPD (HCC)   AKI (acute kidney injury) (HCC)   CKD (chronic kidney disease) stage 3, GFR 30-59 ml/min (HCC)   ILD (interstitial lung disease) (HCC)   Hearing loss    Iron deficiency anemia   Acute on chronic combined systolic and diastolic CHF (congestive heart failure) (HCC)   Acute congestive heart failure (HCC)   #Acute on Chronic Hypoxic Respiratory Failure Resolving  #HFrEF Exacerbation #Combined COPD and ILD #Pulmonary HTN -Cardiology following and will proceed with heart cath tomorrow. - Patient continues to endorse light-headedness and presyncope when sitting/standing. No LOC.  -O2 saturation is 98% on 2 L [place] - Creatinine improved today to 1.55 with baseline 1.2-1.4 -Continue to hold Lasix/entresto in the setting of kidney function above BL -Hold bisoprolol due to aforementioned symptoms -Strict I/O's  #Chronic IDA  -Hgb is stable at 8.6 -Ganzoni equation suggests 1800 mg deficit and patient is s/p Venofer 500 mg X 2 -Transfuse if Hgb<7   #CKD Stage 3a Creatinine improved to 1.55 (1.65) -Hold Lasix and Entresto -U/A unremarkable -Avoid nephrotoxic agents  #T2DM CBG's stable -Continue SSI -Hold metformin    #MDD -Continue Paxil 30 mg    #HTN BP's stable -Holding home Amlodipine 5mg     #GERD Continue home Protonix 40mg     #Hypothyroidism Continue home Levothyroxine   #Hypomagnesemia 1.5 yesterday and  patient given 2 g MgSO4 -Re-check Mg.  Diet: Heart healthy, NPO @ midnight IVF: N/A VTE: enoxaparin (LOVENOX) injection 30 mg Start: 10/11/22 1000  Code: Full PT/OT recommendations: Home Health Discharge plan: Pending clinical improvement   Caitlyn Miller, DO 10/11/2022, 5:44 PM  Pager: [phone] After 5pm or weekend: [phone]

## 2022-10-11 NOTE — Plan of Care (Signed)
  Problem: Education: Goal: Ability to describe self-care measures that may prevent or decrease complications (Diabetes Survival Skills Education) will improve Outcome: Progressing   Problem: Coping: Goal: Ability to adjust to condition or change in health will improve Outcome: Progressing   Problem: Fluid Volume: Goal: Ability to maintain a balanced intake and output will improve Outcome: Progressing   Problem: Health Behavior/Discharge Planning: Goal: Ability to identify and utilize available resources and services will improve Outcome: Progressing   Problem: Metabolic: Goal: Ability to maintain appropriate glucose levels will improve Outcome: Progressing   Problem: Nutritional: Goal: Maintenance of adequate nutrition will improve Outcome: Progressing   Problem: Skin Integrity: Goal: Risk for impaired skin integrity will decrease Outcome: Progressing   Problem: Tissue Perfusion: Goal: Adequacy of tissue perfusion will improve Outcome: Progressing   Problem: Education: Goal: Knowledge of General Education information will improve Description: Including pain rating scale, medication(s)/side effects and non-pharmacologic comfort measures Outcome: Progressing   Problem: Health Behavior/Discharge Planning: Goal: Ability to manage health-related needs will improve Outcome: Progressing   Problem: Clinical Measurements: Goal: Ability to maintain clinical measurements within normal limits will improve Outcome: Progressing   Problem: Activity: Goal: Risk for activity intolerance will decrease Outcome: Progressing   Problem: Nutrition: Goal: Adequate nutrition will be maintained Outcome: Progressing   Problem: Coping: Goal: Level of anxiety will decrease Outcome: Progressing   Problem: Elimination: Goal: Will not experience complications related to bowel motility Outcome: Progressing   Problem: Pain Managment: Goal: General experience of comfort will  improve Outcome: Progressing   Problem: Safety: Goal: Ability to remain free from injury will improve Outcome: Progressing   Problem: Skin Integrity: Goal: Risk for impaired skin integrity will decrease Outcome: Progressing   Problem: Education: Goal: Understanding of CV disease, CV risk reduction, and recovery process will improve Outcome: Progressing   Problem: Activity: Goal: Ability to return to baseline activity level will improve Outcome: Progressing   Problem: Cardiovascular: Goal: Ability to achieve and maintain adequate cardiovascular perfusion will improve Outcome: Progressing   Problem: Health Behavior/Discharge Planning: Goal: Ability to safely manage health-related needs after discharge will improve Outcome: Progressing

## 2022-10-11 NOTE — Plan of Care (Signed)
  Problem: Nutritional: Goal: Maintenance of adequate nutrition will improve Outcome: Progressing   Problem: Skin Integrity: Goal: Risk for impaired skin integrity will decrease Outcome: Progressing

## 2022-10-11 NOTE — Progress Notes (Signed)
Rounding Note    Patient Name: Caitlyn Aguirre Date of Encounter: 10/11/2022  [facility] Cardiologist: None   Subjective   BP 106/55 this morning.  Renal function improved (1.65>1.55).  I/os not recorded.  Denies any chest pain.  Reports continues to have shortness of breath  Inpatient Medications    Scheduled Meds:  atorvastatin  40 mg Oral Daily   bisoprolol  2.5 mg Oral Daily   enoxaparin (LOVENOX) injection  30 mg Subcutaneous Q24H   insulin aspart  0-5 Units Subcutaneous QHS   insulin aspart  0-9 Units Subcutaneous TID WC   levothyroxine  75 mcg Oral Daily   pantoprazole  40 mg Oral Daily   PARoxetine  30 mg Oral Daily   revefenacin  175 mcg Nebulization Daily   senna-docusate  1 tablet Oral QHS   Continuous Infusions:   PRN Meds: acetaminophen **OR** acetaminophen, levalbuterol   Vital Signs    Vitals:   10/11/22 0500 10/11/22 0524 10/11/22 0611 10/11/22 0809  BP:  122/74  (!) 106/55  Pulse:  66 72 68  Resp:    18  Temp:    97.9 F (36.6 C)  TempSrc:  Oral  Oral  SpO2:  98% 96% 94%  Weight: 84.9 kg       Intake/Output Summary (Last 24 hours) at 10/11/2022 1042 Last data filed at 10/11/2022 0518 Gross per 24 hour  Intake 240 ml  Output 401 ml  Net -161 ml      10/11/2022    5:00 AM 10/10/2022    5:00 AM 10/09/2022    5:00 AM  Last 3 Weights  Weight (lbs) 187 lb 2.7 oz 187 lb 13.3 oz 188 lb 15 oz  Weight (kg) 84.9 kg 85.2 kg 85.7 kg      Telemetry    Normal sinus rhythm- Personally Reviewed  ECG    No new ECG- Personally Reviewed  Physical Exam   GEN: No acute distress.   Neck: no JVD Cardiac: RRR, no murmurs, rubs, or gallops.  Respiratory: CTAB GI: Soft, nontender, non-distended  MS: No edema; No deformity. Neuro:  Nonfocal  Psych: Normal affect   Labs    High Sensitivity Troponin:   Recent Labs  Lab 10/08/22 2237 10/09/22 0035  TROPONINIHS 15 15     Chemistry Recent Labs  Lab 10/06/22 1251 10/06/22 1251  10/08/22 2237 10/09/22 0755 10/10/22 0852 10/11/22 0720  NA  --    < > 143 141 139 139  K  --    < > 4.4 4.5 4.8 4.1  CL  --    < > 112* 111 107 107  CO2  --    < > 20* 23 21* 21*  GLUCOSE  --    < > 172* 137* 134* 123*  BUN  --    < > 16 17 29* 27*  CREATININE  --    < > 1.42* 1.47* 1.65* 1.55*  CALCIUM  --    < > 10.0 10.0 9.9 9.6  MG  --   --   --  1.3* 1.5*  --   PROT 7.6  --  6.8  --   --   --   ALBUMIN 4.0  --  3.3* 3.1*  --  3.0*  AST 16  --  27  --   --   --   ALT 9  --  17  --   --   --   ALKPHOS 100  --  96  --   --   --   BILITOT 0.8  --  0.8  --   --   --   GFRNONAA  --    < > 36* 35* 30* 33*  ANIONGAP  --    < > 11 7 11 11    < > = values in this interval not displayed.    Lipids No results for input(s): "CHOL", "TRIG", "HDL", "LABVLDL", "LDLCALC", "CHOLHDL" in the last 168 hours.  Hematology Recent Labs  Lab 10/09/22 0755 10/10/22 0852 10/11/22 0720  WBC 8.0 8.6 7.8  RBC 3.45* 3.68* 3.72*  HGB 8.1* 8.6* 8.6*  HCT 26.2* 27.9* 27.8*  MCV 75.9* 75.8* 74.7*  MCH 23.5* 23.4* 23.1*  MCHC 30.9 30.8 30.9  RDW 19.9* 19.6* 19.7*  PLT 214 232 241   Thyroid  Recent Labs  Lab 10/09/22 0749  TSH 1.491    BNP Recent Labs  Lab 10/06/22 1251 10/08/22 2237  BNP  --  512.5*  PROBNP 388.0*  --     DDimer No results for input(s): "DDIMER" in the last 168 hours.   Radiology    No results found.  Cardiac Studies     Patient Profile     87 y.o. female with a history of normal coronaries on remote cardiac catheterization in 1999 and 2010, chronic HFrEF with EF as low as 35-40% in 1999 but has subsequently normalized, COPD and fibrotic lung disease with chronic hypoxic respiratory failure on 2 L of O2 at home, CVA, hypertension, hyperlipidemia, type 2 diabetes mellitus, hypothyroidism, chronic anemia,and PMR who is being seen 10/09/2022 for the evaluation of CHF   Assessment & Plan    Acute on Chronic HFrEF Non-Ischemic Cardiomyopathy Patient has a history  of nonischemic cardiomyopathy with EF as low as 35-40% back in 1999.  Cath at that time showed normal coronaries and she subsequently had normalization of EF. LVEF was 55-60% in 05/2021.  Patient now resents with acute respiratory failure initially requiring BiPAP.  BNP was elevated in the 500s.  Chest x-ray showed emphysema and chronic fibrotic lung disease but no acute findings.  Echo showed a drop in EF to 30-35% with global hypokinesis and grade 1 diastolic dysfunction as well as mildly reduced RV function with moderately elevated PASP of 47.4 mmHg and a flattened interventricular septum and systole and diastole consistent with RV pressure and volume overload.  She was started on IV Lasix but there has been no documented I's/ O's yet. - Worsening renal function, held lasix and entresto.  Appears euvolemic, will f/u [facility] on Monday.  Can plan to add losartan if stable renal function after cath - Continue Bisoprolol 2.5mg  daily. - Monitor daily weights, strict I/O's, and renal function.  - Will plan for [facility] on Monday pending renal function   Hypertension - Only on Amlodipine 5mg  daily at home. Will stop this and add GDMT as above.   Hyperlipidemia - Continue Lipitor 40mg  daily.   AKI CKD Stage III Creatinine 1.42 on admission and 1.55 today. Baseline around 1.2 to 1.4. -Holding diuresis today   Otherwise, per primary team: - Acute on chronic hypoxic respiratory failure on home O2 - COPD - Fibrotic lung disease - Type 2 diabetes mellitus - Chronic anemia  For questions or updates, please contact [facility] Please consult [email or web address] for contact info under        Signed, Little Ishikawa, MD  10/11/2022, 10:42 AM

## 2022-10-12 ENCOUNTER — Encounter (HOSPITAL_COMMUNITY)
Admission: EM | Disposition: A | Discharge status: Home Health | Payer: Self-pay | Source: Home / Self Care | Attending: Internal Medicine

## 2022-10-12 DIAGNOSIS — I5020 Unspecified systolic (congestive) heart failure: Secondary | ICD-10-CM

## 2022-10-12 DIAGNOSIS — J8417 Interstitial lung disease with progressive fibrotic phenotype in diseases classified elsewhere: Secondary | ICD-10-CM | POA: Diagnosis not present

## 2022-10-12 DIAGNOSIS — I272 Pulmonary hypertension, unspecified: Secondary | ICD-10-CM | POA: Diagnosis not present

## 2022-10-12 DIAGNOSIS — I5043 Acute on chronic combined systolic (congestive) and diastolic (congestive) heart failure: Secondary | ICD-10-CM | POA: Diagnosis not present

## 2022-10-12 DIAGNOSIS — J9621 Acute and chronic respiratory failure with hypoxia: Secondary | ICD-10-CM | POA: Diagnosis not present

## 2022-10-12 HISTORY — PX: RIGHT HEART CATH: CATH118263

## 2022-10-12 LAB — CBC
HCT: 27.9 % — ABNORMAL LOW (ref 36.0–46.0)
Hemoglobin: 8.5 g/dL — ABNORMAL LOW (ref 12.0–15.0)
MCH: 22.8 pg — ABNORMAL LOW (ref 26.0–34.0)
MCHC: 30.5 g/dL (ref 30.0–36.0)
MCV: 75 fL — ABNORMAL LOW (ref 80.0–100.0)
Platelets: 255 10*3/uL (ref 150–400)
RBC: 3.72 MIL/uL — ABNORMAL LOW (ref 3.87–5.11)
RDW: 19.9 % — ABNORMAL HIGH (ref 11.5–15.5)
WBC: 8.4 10*3/uL (ref 4.0–10.5)
nRBC: 1 % — ABNORMAL HIGH (ref 0.0–0.2)

## 2022-10-12 LAB — RENAL FUNCTION PANEL
Albumin: 3.3 g/dL — ABNORMAL LOW (ref 3.5–5.0)
Anion gap: 7 (ref 5–15)
BUN: 30 mg/dL — ABNORMAL HIGH (ref 8–23)
CO2: 22 mmol/L (ref 22–32)
Calcium: 9.9 mg/dL (ref 8.9–10.3)
Chloride: 111 mmol/L (ref 98–111)
Creatinine, Ser: 1.64 mg/dL — ABNORMAL HIGH (ref 0.44–1.00)
GFR, Estimated: 30 mL/min — ABNORMAL LOW (ref >60)
Glucose, Bld: 134 mg/dL — ABNORMAL HIGH (ref 70–99)
Phosphorus: 3.3 mg/dL (ref 2.5–4.6)
Potassium: 5 mmol/L (ref 3.5–5.1)
Sodium: 140 mmol/L (ref 135–145)

## 2022-10-12 LAB — POCT I-STAT EG7
Acid-base deficit: 2 mmol/L (ref 0.0–2.0)
Acid-base deficit: 2 mmol/L (ref 0.0–2.0)
Bicarbonate: 23.9 mmol/L (ref 20.0–28.0)
Bicarbonate: 24.5 mmol/L (ref 20.0–28.0)
Calcium, Ion: 1.41 mmol/L — ABNORMAL HIGH (ref 1.15–1.40)
Calcium, Ion: 1.43 mmol/L — ABNORMAL HIGH (ref 1.15–1.40)
HCT: 30 % — ABNORMAL LOW (ref 36.0–46.0)
HCT: 30 % — ABNORMAL LOW (ref 36.0–46.0)
Hemoglobin: 10.2 g/dL — ABNORMAL LOW (ref 12.0–15.0)
Hemoglobin: 10.2 g/dL — ABNORMAL LOW (ref 12.0–15.0)
O2 Saturation: 43 %
O2 Saturation: 45 %
Potassium: 4.9 mmol/L (ref 3.5–5.1)
Potassium: 4.9 mmol/L (ref 3.5–5.1)
Sodium: 143 mmol/L (ref 135–145)
Sodium: 143 mmol/L (ref 135–145)
TCO2: 25 mmol/L (ref 22–32)
TCO2: 26 mmol/L (ref 22–32)
pCO2, Ven: 46.3 mmHg (ref 44–60)
pCO2, Ven: 47.3 mmHg (ref 44–60)
pH, Ven: 7.321 (ref 7.25–7.43)
pH, Ven: 7.321 (ref 7.25–7.43)
pO2, Ven: 26 mmHg — CL (ref 32–45)
pO2, Ven: 27 mmHg — CL (ref 32–45)

## 2022-10-12 LAB — GLUCOSE, CAPILLARY
Glucose-Capillary: 128 mg/dL — ABNORMAL HIGH (ref 70–99)
Glucose-Capillary: 115 mg/dL — ABNORMAL HIGH (ref 70–99)
Glucose-Capillary: 148 mg/dL — ABNORMAL HIGH (ref 70–99)
Glucose-Capillary: 148 mg/dL — ABNORMAL HIGH (ref 70–99)

## 2022-10-12 SURGERY — RIGHT HEART CATH
Anesthesia: LOCAL

## 2022-10-12 MED ORDER — LIDOCAINE HCL (PF) 1 % IJ SOLN
INTRAMUSCULAR | Status: DC | PRN
  Administered 2022-10-12: 2 mL

## 2022-10-12 MED ORDER — HYDRALAZINE HCL 20 MG/ML IJ SOLN: 10.0000 mg | INTRAMUSCULAR | Status: AC | PRN

## 2022-10-12 MED ORDER — LABETALOL HCL 5 MG/ML IV SOLN: 10.0000 mg | INTRAVENOUS | Status: AC | PRN

## 2022-10-12 MED ORDER — SODIUM CHLORIDE 0.9% FLUSH: 3.0000 mL | INTRAVENOUS | Status: DC | PRN

## 2022-10-12 MED ORDER — LIDOCAINE HCL (PF) 1 % IJ SOLN
INTRAMUSCULAR | Status: AC
  Filled 2022-10-12: qty 30

## 2022-10-12 MED ORDER — IRON SUCROSE 500 MG IVPB - SIMPLE MED
500.0000 mg | Freq: Once | INTRAVENOUS | Status: AC
  Administered 2022-10-12: 500 mg via INTRAVENOUS
  Filled 2022-10-12: qty 275

## 2022-10-12 MED ORDER — SODIUM CHLORIDE 0.9% FLUSH
3.0000 mL | Freq: Two times a day (BID) | INTRAVENOUS | Status: DC
  Administered 2022-10-12 – 2022-10-15 (×7): 3 mL via INTRAVENOUS

## 2022-10-12 MED ORDER — SODIUM CHLORIDE 0.9 % IV SOLN: 250.0000 mL | INTRAVENOUS | Status: DC | PRN

## 2022-10-12 SURGICAL SUPPLY — 9 items
CATH BALLN WEDGE 5F 110CM (CATHETERS) IMPLANT
CATH SWAN GANZ 7F STRAIGHT (CATHETERS) IMPLANT
GUIDEWIRE .025 260CM (WIRE) IMPLANT
PACK CARDIAC CATHETERIZATION (CUSTOM PROCEDURE TRAY) IMPLANT
SHEATH GLIDE SLENDER 4/5FR (SHEATH) IMPLANT
SHEATH PINNACLE 7F 10CM (SHEATH) IMPLANT
SHEATH PROBE COVER 6X72 (BAG) IMPLANT
TRANSDUCER W/STOPCOCK (MISCELLANEOUS) IMPLANT
TUBING ART PRESS 72 MALE/FEM (TUBING) IMPLANT

## 2022-10-12 NOTE — Progress Notes (Signed)
Rounding Note    Patient Name: Caitlyn Aguirre Date of Encounter: 10/12/2022  [facility] Cardiologist: Bjorn Pippin   Subjective   87 yo female with hx of chronic combined CHF, pulmonary fibrosis Pulmonary HTN  She was tenatively scheduled for a R and L heart cath  Her creatinine is higher today so I would like to hold off on her left heart cath but go ahead with her right heart cath   She continues to have dyspnea  No significant CP   Inpatient Medications    Scheduled Meds:  atorvastatin  40 mg Oral Daily   enoxaparin (LOVENOX) injection  30 mg Subcutaneous Q24H   insulin aspart  0-5 Units Subcutaneous QHS   insulin aspart  0-9 Units Subcutaneous TID WC   levothyroxine  75 mcg Oral Daily   pantoprazole  40 mg Oral Daily   PARoxetine  30 mg Oral Daily   revefenacin  175 mcg Nebulization Daily   senna-docusate  1 tablet Oral QHS   Continuous Infusions:  sodium chloride 10 mL/hr at 10/12/22 0335   PRN Meds: acetaminophen **OR** acetaminophen, levalbuterol   Vital Signs    Vitals:   10/12/22 0255 10/12/22 0500 10/12/22 0700 10/12/22 0849  BP: 110/67   118/88  Pulse: 64   65  Resp:    18  Temp: 97.7 F (36.5 C)   98 F (36.7 C)  TempSrc: Oral   Oral  SpO2: 99%  98% 99%  Weight:  84.6 kg      Intake/Output Summary (Last 24 hours) at 10/12/2022 1048 Last data filed at 10/11/2022 1500 Gross per 24 hour  Intake --  Output 300 ml  Net -300 ml      10/12/2022    5:00 AM 10/11/2022    5:00 AM 10/10/2022    5:00 AM  Last 3 Weights  Weight (lbs) 186 lb 8.2 oz 187 lb 2.7 oz 187 lb 13.3 oz  Weight (kg) 84.6 kg 84.9 kg 85.2 kg      Telemetry     - Personally Reviewed  ECG     - Personally Reviewed  Physical Exam   GEN: elderly female, moderately obese,  appears generally weak No acute distress.   Neck: No JVD Cardiac: RRR,  + systolic murmur  Respiratory: rales in bases ( c/w pulmonary fibrosis )  GI: Soft, nontender, non-distended  MS: No edema;  No deformity. Neuro:  Nonfocal  Psych: Normal affect   Labs    High Sensitivity Troponin:   Recent Labs  Lab 10/08/22 2237 10/09/22 0035  TROPONINIHS 15 15     Chemistry Recent Labs  Lab 10/06/22 1251 10/06/22 1251 10/08/22 2237 10/09/22 0755 10/10/22 0852 10/11/22 0716 10/11/22 0720 10/12/22 0651  NA  --    < > 143 141 139  --  139 140  K  --    < > 4.4 4.5 4.8  --  4.1 5.0  CL  --    < > 112* 111 107  --  107 111  CO2  --    < > 20* 23 21*  --  21* 22  GLUCOSE  --    < > 172* 137* 134*  --  123* 134*  BUN  --    < > 16 17 29*  --  27* 30*  CREATININE  --    < > 1.42* 1.47* 1.65*  --  1.55* 1.64*  CALCIUM  --    < > 10.0 10.0  9.9  --  9.6 9.9  MG  --   --   --  1.3* 1.5* 1.8  --   --   PROT 7.6  --  6.8  --   --   --   --   --   ALBUMIN 4.0  --  3.3* 3.1*  --   --  3.0* 3.3*  AST 16  --  27  --   --   --   --   --   ALT 9  --  17  --   --   --   --   --   ALKPHOS 100  --  96  --   --   --   --   --   BILITOT 0.8  --  0.8  --   --   --   --   --   GFRNONAA  --    < > 36* 35* 30*  --  33* 30*  ANIONGAP  --    < > 11 7 11   --  11 7   < > = values in this interval not displayed.    Lipids No results for input(s): "CHOL", "TRIG", "HDL", "LABVLDL", "LDLCALC", "CHOLHDL" in the last 168 hours.  Hematology Recent Labs  Lab 10/10/22 0852 10/11/22 0720 10/12/22 0651  WBC 8.6 7.8 8.4  RBC 3.68* 3.72* 3.72*  HGB 8.6* 8.6* 8.5*  HCT 27.9* 27.8* 27.9*  MCV 75.8* 74.7* 75.0*  MCH 23.4* 23.1* 22.8*  MCHC 30.8 30.9 30.5  RDW 19.6* 19.7* 19.9*  PLT 232 241 255   Thyroid  Recent Labs  Lab 10/09/22 0749  TSH 1.491    BNP Recent Labs  Lab 10/06/22 1251 10/08/22 2237  BNP  --  512.5*  PROBNP 388.0*  --     DDimer No results for input(s): "DDIMER" in the last 168 hours.   Radiology    No results found.  Cardiac Studies     Patient Profile     87 y.o. female with chronic combined CHF, pulmonary fibrosis, pulmonary HTN, CKD, HTN, HLD , COPD   Assessment  & Plan     Acute respiratory failure:   multifactorial.  She has chronic combined CHF with EF 30-35%.  Has pulmonary fibrosis with pulmonary HTN. She appears euvolumic / perhaps slightly dry today  Creatinine is up slightly   I would like to hold off on her left heart cath and just do a Right heart cath   We have discussed the procedure, risks, benefits, options. She and her daughters understand and agree to proceed.   2.   CKD:   creatinine remain moderately elevated   3.  Pulmonary fibrosis :        For questions or updates, please contact [facility] Please consult [email or web address] for contact info under        Signed, Kristeen Miss, MD  10/12/2022, 10:48 AM

## 2022-10-12 NOTE — Care Management Important Message (Signed)
Important Message  Patient Details  Name: Caitlyn Aguirre MRN: [id number] Date of Birth: Oct 02, 1935   Medicare Important Message Given:  Yes     Tarius Stangelo Stefan Church 10/12/2022, 2:58 PM

## 2022-10-12 NOTE — Progress Notes (Signed)
OT Cancellation Note  Patient Details Name: Caitlyn Aguirre MRN: [id number] DOB: [birth year]   Cancelled Treatment:    Reason Eval/Treat Not Completed: Patient at procedure or test/ unavailable.  Pt down for heart cath.  Will check back tomorrow to see for therapy.  [name] OTR/L 10/12/2022, 12:43 PM

## 2022-10-12 NOTE — Interval H&P Note (Signed)
History and Physical Interval Note:  10/12/2022 12:11 PM  [name]  has presented today for surgery, with the diagnosis of heart failure.  The various methods of treatment have been discussed with the patient and family. After consideration of risks, benefits and other options for treatment, the patient has consented to  Procedure(s): RIGHT HEART CATH (N/A) as a surgical intervention.  The patient's history has been reviewed, patient examined, no change in status, stable for surgery.  I have reviewed the patient's chart and labs.  Questions were answered to the patient's satisfaction.     Caitlyn Aguirre

## 2022-10-12 NOTE — Plan of Care (Signed)

## 2022-10-12 NOTE — Progress Notes (Signed)
Subjective Caitlyn Aguirre is a 87 y.o. person living with a history of Grade I diastolic dysfunction with new reduced ejection fraction, COPD, ILD on chronic 2L supplemental oxygen, HTN, CKD stage 3a, T2DM, MDD, Polymyalgia rheumatica, concern pulmonary hypertension who presented with shortness of breath and admitted for Acute on chronic hypoxic respiratory failure on hospital day 3.  No events overnight. Pt reports that she has that she concerns about the SOB episodes with standing she has had over the last several days but no concerns currently. Reported that she is going for heart cath this morning and our plan to continue getting her on GDMT alongside cardiology.   Review of Systems  Respiratory:  Negative for cough and wheezing.        Positive platypnea  Cardiovascular:  Negative for orthopnea.  Neurological:  Negative for dizziness.    Physical exam  Vitales normal last 24 Vitals:   10/12/22 1234 10/12/22 1239 10/12/22 1244 10/12/22 1313  BP: 127/82 131/75 131/83 125/84  Pulse: 64 69 83 68  Resp: (!) 21 (!) 29 (!) 27 18  Temp:    97.6 F (36.4 C)  TempSrc:    Oral  SpO2: 91% 91% 91% 97%  Weight:       Intake/Output Summary (Last 24 hours) at 10/12/2022 1324 Last data filed at 10/11/2022 1500 Gross per 24 hour  Intake --  Output 300 ml  Net -300 ml   Net IO Since Admission: -581 mL [10/12/22 0643]  Physical Exam Constitutional:      General: She is not in acute distress. HENT:     Mouth/Throat:     Comments: Dry mucous membranes per pt Cardiovascular:     Rate and Rhythm: Normal rate and regular rhythm.     Heart sounds: Normal heart sounds.     Comments: CRT: >3 seconds Pulmonary:     Effort: Pulmonary effort is normal.     Breath sounds: Examination of the right-lower field reveals rales. Examination of the left-lower field reveals rales. Rales present.  Abdominal:     Tenderness: There is no abdominal tenderness.  Musculoskeletal:     Right  lower leg: No edema.     Left lower leg: No edema.  Skin:    General: Skin is warm.  Neurological:     General: No focal deficit present.     Mental Status: She is alert.     Labs, images, and other studies BMP: Creatinine 1.55->1.64 (baseline 1.2-.3) CBC: normal WBC, microcytic anemia stable hemoglobin 8.5  Assessment and plan Hospital day 3  Caitlyn Aguirre is a 87 y.o. person living with a history of Grade I diastolic dysfunction with LVEF 30-35% (10/2022), COPD, ILD on chronic 2L supplemental oxygen, HTN, CKD stage 3a, T2DM, MDD, Polymyalgia rheumatica, pulmonary hypertension on CT and TTE who presented with shortness of breath and admitted for Acute on chronic hypoxic respiratory failure on hospital day 3.    Principal Problem:   Acute on chronic respiratory failure (HCC) Active Problems:   COPD (HCC)   AKI (acute kidney injury) (HCC)   CKD (chronic kidney disease) stage 3, GFR 30-59 ml/min (HCC)   ILD (interstitial lung disease) (HCC)   Hearing loss   Iron deficiency anemia   Acute on chronic combined systolic and diastolic CHF (congestive heart failure) (HCC)   Acute congestive heart failure (HCC)   #Acute on Chronic Hypoxic Respiratory Failure  #  HFrEF Exacerbation #Combined COPD and ILD #Pulmonary HTN Pt continues to endsorse light-headedness and presyncope when sitting/standing. Vitales and oxygen have been normal. On physical exam, patient appears hypovolemic but continues to have pulmonary edema. Cardiology opted to defer L heart catheter. Moderate pulmonary HTN confirmed on R heart catheter.  -cardiology following -consider restarting bisoprolol tomorrow -plan to restart losartan once renal function stable, add further GDMT as tolerated -Strict I/O's  #Chronic IDA  -Hgb is stable, 8.6->8.5 -Ganzoni equation suggests 1800 mg deficit, after today receive 1500 mg -Transfuse if Hgb<7   #CKD Stage 3a Creatinine worsened again, 1.65->1.55->1.64. Hypovolemic on  fluid status.  -Holding lasix and ARBs for GDMT -Avoid nephrotoxic agents   #Hypomagnesemia Repeat Mg pending  #T2DM CBG's stable -Continue SSI -Hold metformin    #MDD -Continue Paxil 30 mg    #HTN BP's stable -stopping home amlodipine in favor of GDMT for new HFrEF   #GERD Continue home Protonix 40mg     #Hypothyroidism Continue home Levothyroxine   Diet: Heart healthy, NPO @ midnight IVF: N/A VTE: enoxaparin (LOVENOX) injection 30 mg Start: 10/11/22 1000  Code: Full PT/OT recommendations: Home Health Discharge plan: Pending clinical improvement   Meryl Dare, MD PGY-1 Psychiatry Resident 10/12/2022, 4:27 PM Pager: [phone] After 5pm or weekend: [phone]

## 2022-10-12 NOTE — H&P (View-Only) (Signed)
Rounding Note    Patient Name: Caitlyn Aguirre Date of Encounter: 10/12/2022  [facility] Cardiologist: Bjorn Pippin   Subjective   87 yo female with hx of chronic combined CHF, pulmonary fibrosis Pulmonary HTN  She was tenatively scheduled for a R and L heart cath  Her creatinine is higher today so I would like to hold off on her left heart cath but go ahead with her right heart cath   She continues to have dyspnea  No significant CP   Inpatient Medications    Scheduled Meds:  atorvastatin  40 mg Oral Daily   enoxaparin (LOVENOX) injection  30 mg Subcutaneous Q24H   insulin aspart  0-5 Units Subcutaneous QHS   insulin aspart  0-9 Units Subcutaneous TID WC   levothyroxine  75 mcg Oral Daily   pantoprazole  40 mg Oral Daily   PARoxetine  30 mg Oral Daily   revefenacin  175 mcg Nebulization Daily   senna-docusate  1 tablet Oral QHS   Continuous Infusions:  sodium chloride 10 mL/hr at 10/12/22 0335   PRN Meds: acetaminophen **OR** acetaminophen, levalbuterol   Vital Signs    Vitals:   10/12/22 0255 10/12/22 0500 10/12/22 0700 10/12/22 0849  BP: 110/67   118/88  Pulse: 64   65  Resp:    18  Temp: 97.7 F (36.5 C)   98 F (36.7 C)  TempSrc: Oral   Oral  SpO2: 99%  98% 99%  Weight:  84.6 kg      Intake/Output Summary (Last 24 hours) at 10/12/2022 1048 Last data filed at 10/11/2022 1500 Gross per 24 hour  Intake --  Output 300 ml  Net -300 ml      10/12/2022    5:00 AM 10/11/2022    5:00 AM 10/10/2022    5:00 AM  Last 3 Weights  Weight (lbs) 186 lb 8.2 oz 187 lb 2.7 oz 187 lb 13.3 oz  Weight (kg) 84.6 kg 84.9 kg 85.2 kg      Telemetry     - Personally Reviewed  ECG     - Personally Reviewed  Physical Exam   GEN: elderly female, moderately obese,  appears generally weak No acute distress.   Neck: No JVD Cardiac: RRR,  + systolic murmur  Respiratory: rales in bases ( c/w pulmonary fibrosis )  GI: Soft, nontender, non-distended  MS: No edema;  No deformity. Neuro:  Nonfocal  Psych: Normal affect   Labs    High Sensitivity Troponin:   Recent Labs  Lab 10/08/22 2237 10/09/22 0035  TROPONINIHS 15 15     Chemistry Recent Labs  Lab 10/06/22 1251 10/06/22 1251 10/08/22 2237 10/09/22 0755 10/10/22 0852 10/11/22 0716 10/11/22 0720 10/12/22 0651  NA  --    < > 143 141 139  --  139 140  K  --    < > 4.4 4.5 4.8  --  4.1 5.0  CL  --    < > 112* 111 107  --  107 111  CO2  --    < > 20* 23 21*  --  21* 22  GLUCOSE  --    < > 172* 137* 134*  --  123* 134*  BUN  --    < > 16 17 29*  --  27* 30*  CREATININE  --    < > 1.42* 1.47* 1.65*  --  1.55* 1.64*  CALCIUM  --    < > 10.0 10.0  9.9  --  9.6 9.9  MG  --   --   --  1.3* 1.5* 1.8  --   --   PROT 7.6  --  6.8  --   --   --   --   --   ALBUMIN 4.0  --  3.3* 3.1*  --   --  3.0* 3.3*  AST 16  --  27  --   --   --   --   --   ALT 9  --  17  --   --   --   --   --   ALKPHOS 100  --  96  --   --   --   --   --   BILITOT 0.8  --  0.8  --   --   --   --   --   GFRNONAA  --    < > 36* 35* 30*  --  33* 30*  ANIONGAP  --    < > 11 7 11   --  11 7   < > = values in this interval not displayed.    Lipids No results for input(s): "CHOL", "TRIG", "HDL", "LABVLDL", "LDLCALC", "CHOLHDL" in the last 168 hours.  Hematology Recent Labs  Lab 10/10/22 0852 10/11/22 0720 10/12/22 0651  WBC 8.6 7.8 8.4  RBC 3.68* 3.72* 3.72*  HGB 8.6* 8.6* 8.5*  HCT 27.9* 27.8* 27.9*  MCV 75.8* 74.7* 75.0*  MCH 23.4* 23.1* 22.8*  MCHC 30.8 30.9 30.5  RDW 19.6* 19.7* 19.9*  PLT 232 241 255   Thyroid  Recent Labs  Lab 10/09/22 0749  TSH 1.491    BNP Recent Labs  Lab 10/06/22 1251 10/08/22 2237  BNP  --  512.5*  PROBNP 388.0*  --     DDimer No results for input(s): "DDIMER" in the last 168 hours.   Radiology    No results found.  Cardiac Studies     Patient Profile     87 y.o. female with chronic combined CHF, pulmonary fibrosis, pulmonary HTN, CKD, HTN, HLD , COPD   Assessment  & Plan     Acute respiratory failure:   multifactorial.  She has chronic combined CHF with EF 30-35%.  Has pulmonary fibrosis with pulmonary HTN. She appears euvolumic / perhaps slightly dry today  Creatinine is up slightly   I would like to hold off on her left heart cath and just do a Right heart cath   We have discussed the procedure, risks, benefits, options. She and her daughters understand and agree to proceed.   2.   CKD:   creatinine remain moderately elevated   3.  Pulmonary fibrosis :        For questions or updates, please contact [facility] Please consult [email or web address] for contact info under        Signed, Kristeen Miss, MD  10/12/2022, 10:48 AM

## 2022-10-13 ENCOUNTER — Encounter (HOSPITAL_COMMUNITY): Payer: Self-pay | Admitting: Internal Medicine

## 2022-10-13 ENCOUNTER — Other Ambulatory Visit (HOSPITAL_COMMUNITY): Payer: Self-pay

## 2022-10-13 DIAGNOSIS — J449 Chronic obstructive pulmonary disease, unspecified: Secondary | ICD-10-CM | POA: Diagnosis not present

## 2022-10-13 DIAGNOSIS — J8410 Pulmonary fibrosis, unspecified: Secondary | ICD-10-CM | POA: Diagnosis not present

## 2022-10-13 DIAGNOSIS — J9621 Acute and chronic respiratory failure with hypoxia: Secondary | ICD-10-CM | POA: Diagnosis not present

## 2022-10-13 DIAGNOSIS — I5043 Acute on chronic combined systolic (congestive) and diastolic (congestive) heart failure: Secondary | ICD-10-CM | POA: Diagnosis not present

## 2022-10-13 LAB — RENAL FUNCTION PANEL
Albumin: 3.1 g/dL — ABNORMAL LOW (ref 3.5–5.0)
Anion gap: 6 (ref 5–15)
BUN: 29 mg/dL — ABNORMAL HIGH (ref 8–23)
CO2: 23 mmol/L (ref 22–32)
Calcium: 10 mg/dL (ref 8.9–10.3)
Chloride: 110 mmol/L (ref 98–111)
Creatinine, Ser: 1.56 mg/dL — ABNORMAL HIGH (ref 0.44–1.00)
GFR, Estimated: 32 mL/min — ABNORMAL LOW (ref >60)
Glucose, Bld: 129 mg/dL — ABNORMAL HIGH (ref 70–99)
Phosphorus: 2.9 mg/dL (ref 2.5–4.6)
Potassium: 5.1 mmol/L (ref 3.5–5.1)
Sodium: 139 mmol/L (ref 135–145)

## 2022-10-13 LAB — CBC
HCT: 29.2 % — ABNORMAL LOW (ref 36.0–46.0)
Hemoglobin: 8.8 g/dL — ABNORMAL LOW (ref 12.0–15.0)
MCH: 23.5 pg — ABNORMAL LOW (ref 26.0–34.0)
MCHC: 30.1 g/dL (ref 30.0–36.0)
MCV: 77.9 fL — ABNORMAL LOW (ref 80.0–100.0)
Platelets: 244 10*3/uL (ref 150–400)
RBC: 3.75 MIL/uL — ABNORMAL LOW (ref 3.87–5.11)
RDW: 20.6 % — ABNORMAL HIGH (ref 11.5–15.5)
WBC: 8.1 10*3/uL (ref 4.0–10.5)
nRBC: 0.7 % — ABNORMAL HIGH (ref 0.0–0.2)

## 2022-10-13 LAB — GLUCOSE, CAPILLARY
Glucose-Capillary: 115 mg/dL — ABNORMAL HIGH (ref 70–99)
Glucose-Capillary: 156 mg/dL — ABNORMAL HIGH (ref 70–99)
Glucose-Capillary: 110 mg/dL — ABNORMAL HIGH (ref 70–99)
Glucose-Capillary: 127 mg/dL — ABNORMAL HIGH (ref 70–99)

## 2022-10-13 MED ORDER — SODIUM CHLORIDE 0.9 % IV SOLN: 300.0000 mg | Freq: Once | INTRAVENOUS | Status: DC

## 2022-10-13 MED ORDER — LOSARTAN POTASSIUM 50 MG PO TABS
25.0000 mg | ORAL_TABLET | Freq: Every day | ORAL | Status: DC
  Administered 2022-10-13 – 2022-10-14 (×2): 25 mg via ORAL
  Filled 2022-10-13 (×2): qty 1

## 2022-10-13 NOTE — Progress Notes (Signed)
Occupational Therapy Treatment Patient Details Name: Caitlyn Aguirre MRN: [id number] DOB: 02-Mar-1935 Today's Date: 10/13/2022   History of present illness 87 yo female admitted 9/5 with SOB and LB edema with CHF exacerbation. PMhx: CHF, LVEF 55-60%, COPD, ILD on chronic 2L O2, HTN, CKD, T2DM, MDD, Polymyalgia rheumatica, pulmonary hypertension   OT comments  Pt completed toileting tasks and functional mobility during session with and without use of the RW for support.  Supervision with use of the RW and min guard without, demonstrating much slower movement and decreased balance without.  Oxygen sats at 92% or better on 2 Ls nasal cannula throughout with activity.  Feel she is making steady progress.  Recommend continued acute care OT at this time in order to progress with ADL status.  HHOT recommended for continued progress post acute discharge.       If plan is discharge home, recommend the following:  A little help with walking and/or transfers;A little help with bathing/dressing/bathroom;Assistance with cooking/housework;Assist for transportation;Help with stairs or ramp for entrance;Direct supervision/assist for medications management   Equipment Recommendations  BSC/3in1;Tub/shower bench       Precautions / Restrictions Precautions Precautions: Fall;Other (comment) Precaution Comments: watch sats Restrictions Weight Bearing Restrictions: No       Mobility Bed Mobility Overal bed mobility: Needs Assistance Bed Mobility: Supine to Sit, Sit to Supine     Supine to sit: Supervision, HOB elevated Sit to supine: Supervision        Transfers Overall transfer level: Needs assistance Equipment used: Rolling walker (2 wheels) Transfers: Sit to/from Stand Sit to Stand: Supervision     Step pivot transfers: Supervision     General transfer comment: Supervision for sit to stand from the EOB and from the 3:1 over the toilet.  Min instructional cueing for upright posture.      Balance Overall balance assessment: Needs assistance Sitting-balance support: No upper extremity supported, Feet supported Sitting balance-Leahy Scale: Good     Standing balance support: During functional activity, Reliant on assistive device for balance, Single extremity supported Standing balance-Leahy Scale: Fair Standing balance comment: Pt able to stand and wash hands at the sink with supervision but needs assistive device for support with mobility.                           ADL either performed or assessed with clinical judgement   ADL Overall ADL's : Needs assistance/impaired     Grooming: Wash/dry hands;Supervision/safety;Standing                   Toilet Transfer: Supervision/safety;Ambulation;Rolling walker (2 wheels);Regular Toilet;Grab bars   Toileting- Clothing Manipulation and Hygiene: Supervision/safety;Sit to/from stand       Functional mobility during ADLs: Contact guard assist (ambulation without an assistive device) General ADL Comments: Pt completed functional mobility with the RW at supervision and supplemental O2 at 2Ls nasal cannula with sats maintaining 92% or greater.   Without use of the RW pt exhibited decreased weightshift and decreased balance.  Recommend use of walker at home for safety as well as 3:1 and tub bench.      Cognition Arousal: Alert Behavior During Therapy: WFL for tasks assessed/performed Overall Cognitive Status: Within Functional Limits for tasks assessed  Pertinent Vitals/ Pain       Pain Assessment Pain Assessment: Faces Pain Score: 0-No pain         Frequency  Min 1X/week        Progress Toward Goals  OT Goals(current goals can now be found in the care plan section)  Progress towards OT goals: Progressing toward goals  Acute Rehab OT Goals Patient Stated Goal: Go home today OT Goal Formulation: With patient Time For  Goal Achievement: 10/23/22 Potential to Achieve Goals: Good  Plan         AM-PAC OT "6 Clicks" Daily Activity     Outcome Measure   Help from another person eating meals?: None Help from another person taking care of personal grooming?: A Little Help from another person toileting, which includes using toliet, bedpan, or urinal?: A Little Help from another person bathing (including washing, rinsing, drying)?: A Little Help from another person to put on and taking off regular upper body clothing?: A Little Help from another person to put on and taking off regular lower body clothing?: A Little 6 Click Score: 19    End of Session Equipment Utilized During Treatment: Gait belt;Rolling walker (2 wheels)  OT Visit Diagnosis: Unsteadiness on feet (R26.81);Other abnormalities of gait and mobility (R26.89);Muscle weakness (generalized) (M62.81)   Activity Tolerance Patient limited by fatigue   Patient Left in bed;with call bell/phone within reach;with family/visitor present   Nurse Communication Mobility status        Time: 4098-1191 OT Time Calculation (min): 36 min  Charges: OT General Charges $OT Visit: 1 Visit OT Treatments $Self Care/Home Management : 23-37 mins  Perrin Maltese, OTR/L Acute Rehabilitation Services  Office [phone] 10/13/2022

## 2022-10-13 NOTE — Progress Notes (Signed)
Physical Therapy Treatment Patient Details Name: CAYLI BREHM MRN: [id number] DOB: [birth year] Today's Date: 10/13/2022   History of Present Illness 87 yo female admitted 9/5 with SOB and LB edema with CHF exacerbation. PMhx: CHF, LVEF 55-60%, COPD, ILD on chronic 2L O2, HTN, CKD, T2DM, MDD, Polymyalgia rheumatica, pulmonary hypertension    PT Comments  Pt presents semi-reclined in bed and agreeable to therapy, sitting up before PT can lower side rails.  Pt wishes to go to BR.  Pt transfers sit to stand w/ CGA/supervision w/ cues for hand placement (reaching for RW).  Pt amb to BR w/ close supervision and CGA for toilet transfer stand to sit.  Pt continent of bladder in toilet (NT to chart) and independent for pericare and for pull-up management.  Pt stood at sink for hand washing and noted SOB.  Pt returned to sitting EOB and O2 sats at 82%, O2 increased to 3 L and O2 increased to > 96%.  Education given on breathing techniques as well as w/ exertion.  Pt remained sitting EOB w/ all needs in reach, bed alarm on and family present    If plan is discharge home, recommend the following: Assistance with cooking/housework;Assist for transportation;Help with stairs or ramp for entrance   Can travel by private vehicle        Equipment Recommendations       Recommendations for Other Services       Precautions / Restrictions Precautions Precautions: Fall;Other (comment) Precaution Comments: watch sats Restrictions Weight Bearing Restrictions: No     Mobility  Bed Mobility Overal bed mobility: Needs Assistance Bed Mobility: Supine to Sit     Supine to sit: Supervision, HOB elevated     General bed mobility comments: pt attempts to sit before bed rails lowered, stating she feels better.    Transfers Overall transfer level: Needs assistance Equipment used: Rolling walker (2 wheels) Transfers: Sit to/from Stand Sit to Stand: Supervision (cues for hand placement)                 Ambulation/Gait Ambulation/Gait assistance: Contact guard assist Gait Distance (Feet): 12 Feet (x 2) Assistive device: Rolling walker (2 wheels) Gait Pattern/deviations: Step-through pattern, Decreased stride length, Trunk flexed       General Gait Details: cues for walker management and posture, breathing techniques for decreased O2 sats.  Pt amb to sink w/ SOB noted, sat EOB w/ O2 sats decreased to 82% and O2 increased to 3 L w/ recovery to 97% w/in 2 minutes w/ breathing ed, returned to 2 L.   Stairs             Wheelchair Mobility     Tilt Bed    Modified Rankin (Stroke Patients Only)       Balance Overall balance assessment: Needs assistance                                          Cognition Arousal: Alert Behavior During Therapy: WFL for tasks assessed/performed                                            Exercises      General Comments        Pertinent Vitals/Pain Pain Assessment Pain Assessment: No/denies pain  PT Goals (current goals can now be found in the care plan section) Acute Rehab PT Goals Time For Goal Achievement: 10/23/22 Potential to Achieve Goals: Fair Progress towards PT goals: Progressing toward goals    Frequency    Min 1X/week      PT Plan      Co-evaluation              AM-PAC PT "6 Clicks" Mobility   Outcome Measure  Help needed turning from your back to your side while in a flat bed without using bedrails?: A Little Help needed moving from lying on your back to sitting on the side of a flat bed without using bedrails?: A Little Help needed moving to and from a bed to a chair (including a wheelchair)?: A Little Help needed standing up from a chair using your arms (e.g., wheelchair or bedside chair)?: A Little Help needed to walk in hospital room?: A Little Help needed climbing 3-5 steps with a railing? : A Lot 6 Click Score: 17    End of Session Equipment  Utilized During Treatment: Oxygen;Gait belt Activity Tolerance: Patient tolerated treatment well Patient left: in bed;with family/visitor present;Other (comment);with call bell/phone within reach;with bed alarm set (sitting EOB w/ family.)   PT Visit Diagnosis: Other abnormalities of gait and mobility (R26.89)     Time: 4696-2952 PT Time Calculation (min) (ACUTE ONLY): 19 min  Charges:    $Gait Training: 8-22 mins PT General Charges $$ ACUTE PT VISIT: 1 Visit                     Lucio Edward, PT    Lucio Edward 10/13/2022, 10:10 AM

## 2022-10-13 NOTE — Progress Notes (Signed)
    Durable Medical Equipment  (From admission, onward)           Start     Ordered   10/13/22 1447  For home use only DME Bedside commode  Once       Comments: Needs 3:1  Question:  Patient needs a bedside commode to treat with the following condition  Answer:  Generalized weakness   10/13/22 1446   10/13/22 1446  For home use only DME 4 wheeled rolling walker with seat  Once       Question:  Patient needs a walker to treat with the following condition  Answer:  Generalized weakness   10/13/22 1446
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
## 2022-10-13 NOTE — Plan of Care (Signed)

## 2022-10-13 NOTE — Progress Notes (Signed)
   Heart Failure Stewardship Pharmacist Progress Note   PCP: [name], MD PCP-Cardiologist: None    HPI:  [name] with PMH of CHF, COPD, ILD, HTN, CKD IIIa, T2DM, MDD, polymyalgia rheumatica, anemia, and pulmonary hypertension.   Patient has a history of nonischemic cardiomyopathy with EF as low as 35-40% back in 1999. Cath at that time showed normal coronaries and she subsequently had normalization of EF. LVEF was 55-60% in 05/2021.   She presented to the ED on 9/6 with shortness of breath, orthopnea, lightheadedness, dizziness, and LE edema. BNP elevated 500s. CXR with emphysema and chronic fibrotic lung disease.  ECHO 9/6 showed LVEF 30-35%, global hypokinesis, G1DD, RV mildly reduced, moderately elevated PA pressure, trivial MR. Taken for RHC on 9/9 and RA 5, PA 28, wedge 1, Fick CO/CI 4.9/2.5, Thermo CO/CI 3.4/1.7. Unable to do LHC with worsening renal function.     Current HF Medications: None  Prior to admission HF Medications: None  Pertinent Lab Values: Serum creatinine 1.56, BUN 29, Potassium 5.1, Sodium 139, BNP 512.5, Magnesium 1.8   Vital Signs: Weight: 186 lbs (admission weight: 188 lbs) Blood pressure: 120/70s  Heart rate: 60-70s  I/O: net -0.3L since admission  Medication Assistance / Insurance Benefits Check: Does the patient have prescription insurance?  Yes Type of insurance plan: [facility] Medicare  Outpatient Pharmacy:  Prior to admission outpatient pharmacy: [facility] Is the patient willing to use [facility] TOC pharmacy at discharge? Yes Is the patient willing to transition their outpatient pharmacy to utilize a [facility] outpatient pharmacy?   Pending    Assessment: 1. Acute on chronic systolic CHF (LVEF 30-35%). NYHA class II symptoms. - Volume improved. Wedge 1 on cath. Strict I/Os and daily weights. Keep K>4 and Mg>2. - Moderately reduced thermodilution cardiac output - caution adding BB - GDMT limited by AKI on CKD   Plan: 1) Medication  changes recommended at this time: - No changes  2) Patient assistance: - Entresto copay $0 - Jardiance/Farxiga copay $0  3)  Education  - Patient sleeping - unable to participate in education - Full education to be completed prior to discharge  Sharen Hones, PharmD, BCPS Heart Failure Stewardship Pharmacist Phone [phone]

## 2022-10-13 NOTE — Plan of Care (Signed)
  Problem: Fluid Volume: Goal: Ability to maintain a balanced intake and output will improve Outcome: Progressing   Problem: Health Behavior/Discharge Planning: Goal: Ability to identify and utilize available resources and services will improve Outcome: Progressing   Problem: Health Behavior/Discharge Planning: Goal: Ability to manage health-related needs will improve Outcome: Progressing   Problem: Metabolic: Goal: Ability to maintain appropriate glucose levels will improve Outcome: Progressing   Problem: Nutritional: Goal: Maintenance of adequate nutrition will improve Outcome: Progressing   Problem: Skin Integrity: Goal: Risk for impaired skin integrity will decrease Outcome: Progressing   Problem: Education: Goal: Knowledge of General Education information will improve Description: Including pain rating scale, medication(s)/side effects and non-pharmacologic comfort measures Outcome: Progressing   Problem: Health Behavior/Discharge Planning: Goal: Ability to manage health-related needs will improve Outcome: Progressing   Problem: Clinical Measurements: Goal: Will remain free from infection Outcome: Progressing   Problem: Activity: Goal: Risk for activity intolerance will decrease Outcome: Progressing   Problem: Nutrition: Goal: Adequate nutrition will be maintained Outcome: Progressing   Problem: Coping: Goal: Level of anxiety will decrease Outcome: Progressing

## 2022-10-13 NOTE — Progress Notes (Signed)
                 Interval history Feeling somewhat better.  No attacks of shortness of breath.  Sits up in bed without any symptoms.  She feels like she is ready to leave the hospital.  Physical exam Blood pressure 122/79, pulse 82, temperature 97.9 F (36.6 C), temperature source Oral, resp. rate 18, weight 84.6 kg, SpO2 99%.  No distress Heart rate is normal, rhythm is regular, strong peripheral pulses, no lower extremity edema Breathing is regular and unlabored on room air, crackles in bilateral lower lungs Skin is warm and dry Alert and oriented  Labs, images, and other studies Right heart pressures: - RA 10 - RV 54/10 - PA 52/16 - PCWP 10  Fick CO 4.9 CI 2.5  Assessment and plan Hospital day 4  Caitlyn Aguirre is a 87 y.o. admitted for acute on chronic respiratory failure due to cor pulmonale with biventricular heart failure.  Principal Problem:   Acute on chronic combined systolic and diastolic CHF (congestive heart failure) (HCC) RV failure in setting of cor pulmonale.  Left heart is well compensated.  Best to hold diuretics as she is euvolemic and probably preload dependent.  Slowly adding back GDMT, monitor for side effects and decreased renal function.  Appreciate cardiology assistance.  Hopeful for discharge tomorrow. - Start losartan today - BMP tomorrow  Active Problems:   COPD (HCC) - Revefenacin via neb - Levalbuterol as needed    AKI (acute kidney injury) (HCC) Creatinine stable. - A.m. BMP    CKD (chronic kidney disease) stage 3, GFR 30-59 ml/min (HCC) - Per above    ILD (interstitial lung disease) (HCC) Chronic and stable    Hearing loss Chronic and stable    Acute on chronic respiratory failure (HCC) Resolved.  Back on home supplemental oxygen.    Iron deficiency anemia Status post 1500 mg of iron sucrose.  Diet: Carb modified IVF: N/A VTE: enoxaparin (LOVENOX) injection 30 mg Start: 10/11/22 1000  Code: Full PT/OT recommendations:  Home health TOC recommendations: Home health referrals placed, appreciate assistance Family Update: At bedside  Discharge plan: Hopeful for tomorrow, uptitrating GDMT  Marrianne Mood MD 10/13/2022, 5:48 PM  Pager: [phone] After 5pm or weekend: [phone]

## 2022-10-13 NOTE — Progress Notes (Signed)
Transition of Care [facility]) - Inpatient Brief Assessment   Patient Details  Name: ROTEM LIJEWSKI MRN: [id number] Date of Birth: 28-May-1935  Transition of Care [facility] CM/SW Contact:    Janae Bridgeman, RN Phone Number: 10/13/2022, 3:08 PM   Clinical Narrative: Patient admitted to the hospital with Acute on chronic respiratory failure, S/P heart catheterization yesterday.  The patient plans to return home with home health services when stable for discharge.  I met with the patient at the bedside and requested that family return to her home to provide patient with portable oxygen tank to go home since patient will need to wear when she discharges by car.  I provided Medicare choice to patient regarding DME services and the patient did not have a preference.  The patient chose to order a bathroom scale and tub bench from [facility] and family plans to assist to order.   Rolator and 3:1 were ordered through [employer] to be delivered to the hospital room today prior to discharge.  Patient was offered Medicare choice regarding home health agency and patient prefers [facility] - orders placed for [employer] PT/OT.  I called Kandee Keen, RNCM with [facility] and he accepted for services.  Patient will discharge home with family by car and family plans to get her portable oxygen tank from home prior to her discharge by car.   Transition of Care Asessment: Insurance and Status: (P) Insurance coverage has been reviewed Patient has primary care physician: (P) Yes Home environment has been reviewed: (P) Home with family Prior level of function:: (P) Independent Prior/Current Home Services: (P) No current home services Social Determinants of Health Reivew: (P) SDOH reviewed interventions complete Readmission risk has been reviewed: (P) Yes Transition of care needs: (P) transition of care needs identified, TOC will continue to follow

## 2022-10-13 NOTE — Progress Notes (Signed)
Rounding Note    Patient Name: Caitlyn Aguirre Date of Encounter: 10/13/2022  [facility] Cardiologist: Bjorn Pippin   Subjective   87 yo female with hx of chronic combined CHF, pulmonary fibrosis Pulmonary HTN  Right heart cath yesterday showed normal RA pressures, normla wedge pressure  Moderate - severe pulmonary HTN    Inpatient Medications    Scheduled Meds:  atorvastatin  40 mg Oral Daily   enoxaparin (LOVENOX) injection  30 mg Subcutaneous Q24H   insulin aspart  0-5 Units Subcutaneous QHS   insulin aspart  0-9 Units Subcutaneous TID WC   levothyroxine  75 mcg Oral Daily   pantoprazole  40 mg Oral Daily   PARoxetine  30 mg Oral Daily   revefenacin  175 mcg Nebulization Daily   senna-docusate  1 tablet Oral QHS   sodium chloride flush  3 mL Intravenous Q12H   Continuous Infusions:  sodium chloride     PRN Meds: sodium chloride, acetaminophen **OR** acetaminophen, levalbuterol, sodium chloride flush   Vital Signs    Vitals:   10/13/22 0544 10/13/22 0613 10/13/22 0804 10/13/22 1000  BP: 127/71  123/69   Pulse: 68  77 78  Resp: 18     Temp: 97.9 F (36.6 C)     TempSrc: Oral     SpO2: 99%  98% (!) 82%  Weight:  84.6 kg      Intake/Output Summary (Last 24 hours) at 10/13/2022 1156 Last data filed at 10/12/2022 1500 Gross per 24 hour  Intake 240 ml  Output --  Net 240 ml      10/13/2022    6:13 AM 10/12/2022    5:00 AM 10/11/2022    5:00 AM  Last 3 Weights  Weight (lbs) 186 lb 8.2 oz 186 lb 8.2 oz 187 lb 2.7 oz  Weight (kg) 84.6 kg 84.6 kg 84.9 kg      Telemetry    NSR  - Personally Reviewed  ECG     - Personally Reviewed  Physical Exam   Physical Exam: Blood pressure 123/69, pulse 78, temperature 97.9 F (36.6 C), temperature source Oral, resp. rate 18, weight 84.6 kg, SpO2 (!) 82%.      GEN:  elderly female,   in no acute distress HEENT: Normal NECK: No JVD; No carotid bruits LYMPHATICS: No lymphadenopathy CARDIAC: RRR    RESPIRATORY:   coarse rales bilaterally ,   ABDOMEN: Soft, non-tender, non-distended MUSCULOSKELETAL:  No edema; No deformity  SKIN: Warm and dry NEUROLOGIC:  Alert and oriented x 3  Labs    High Sensitivity Troponin:   Recent Labs  Lab 10/08/22 2237 10/09/22 0035  TROPONINIHS 15 15     Chemistry Recent Labs  Lab 10/06/22 1251 10/06/22 1251 10/08/22 2237 10/09/22 0755 10/10/22 0852 10/11/22 0716 10/11/22 0720 10/12/22 0651 10/12/22 1228 10/13/22 0444  NA  --    < > 143 141 139  --  139 140 143  143 139  K  --    < > 4.4 4.5 4.8  --  4.1 5.0 4.9  4.9 5.1  CL  --    < > 112* 111 107  --  107 111  --  110  CO2  --    < > 20* 23 21*  --  21* 22  --  23  GLUCOSE  --    < > 172* 137* 134*  --  123* 134*  --  129*  BUN  --    < >  16 17 29*  --  27* 30*  --  29*  CREATININE  --    < > 1.42* 1.47* 1.65*  --  1.55* 1.64*  --  1.56*  CALCIUM  --    < > 10.0 10.0 9.9  --  9.6 9.9  --  10.0  MG  --   --   --  1.3* 1.5* 1.8  --   --   --   --   PROT 7.6  --  6.8  --   --   --   --   --   --   --   ALBUMIN 4.0  --  3.3* 3.1*  --   --  3.0* 3.3*  --  3.1*  AST 16  --  27  --   --   --   --   --   --   --   ALT 9  --  17  --   --   --   --   --   --   --   ALKPHOS 100  --  96  --   --   --   --   --   --   --   BILITOT 0.8  --  0.8  --   --   --   --   --   --   --   GFRNONAA  --    < > 36* 35* 30*  --  33* 30*  --  32*  ANIONGAP  --    < > 11 7 11   --  11 7  --  6   < > = values in this interval not displayed.    Lipids No results for input(s): "CHOL", "TRIG", "HDL", "LABVLDL", "LDLCALC", "CHOLHDL" in the last 168 hours.  Hematology Recent Labs  Lab 10/11/22 0720 10/12/22 0651 10/12/22 1228 10/13/22 0444  WBC 7.8 8.4  --  8.1  RBC 3.72* 3.72*  --  3.75*  HGB 8.6* 8.5* 10.2*  10.2* 8.8*  HCT 27.8* 27.9* 30.0*  30.0* 29.2*  MCV 74.7* 75.0*  --  77.9*  MCH 23.1* 22.8*  --  23.5*  MCHC 30.9 30.5  --  30.1  RDW 19.7* 19.9*  --  20.6*  PLT 241 255  --  244   Thyroid   Recent Labs  Lab 10/09/22 0749  TSH 1.491    BNP Recent Labs  Lab 10/06/22 1251 10/08/22 2237  BNP  --  512.5*  PROBNP 388.0*  --     DDimer No results for input(s): "DDIMER" in the last 168 hours.   Radiology    CARDIAC CATHETERIZATION  Result Date: 10/12/2022 Conclusions: Normal left heart filling pressure. Mildly elevated right heart filling pressure. Moderate pulmonary hypertension. Normal Fick cardiac output. Moderately reduced thermodilution cardiac output. Recommendations: Continue to optimize goal-directed medical therapy for HFrEF. Yvonne Kendall, MD [facility]   Cardiac Studies     Patient Profile     87 y.o. female with chronic combined CHF, pulmonary fibrosis, pulmonary HTN, CKD, HTN, HLD , COPD   Assessment & Plan     Acute respiratory failure:   multifactorial.  She has chronic combined CHF with EF 30-35%.  Has pulmonary fibrosis with pulmonary HTN.   Cath yesterday shows that she is euvolemic at present .   Would use lasix for weight gain .  We had a long discussion about avoiding salt   2.   CKD:   creatinine  appears stable   3.  Pulmonary fibrosis :   further plans per pulmonary   4.  Chronic HFrEF:  EF is 30-35%.  She is not having angina .   She is a poor candidate for invasive or interventional procedures.  I would hold off on any ischemic evaluation and continue with medical therapy .   Will add low dose Losartan and follow creatinine closely     For questions or updates, please contact [facility] Please consult [email or web address] for contact info under        Signed, Kristeen Miss, MD  10/13/2022, 11:56 AM

## 2022-10-14 DIAGNOSIS — J449 Chronic obstructive pulmonary disease, unspecified: Secondary | ICD-10-CM | POA: Diagnosis not present

## 2022-10-14 DIAGNOSIS — I272 Pulmonary hypertension, unspecified: Secondary | ICD-10-CM | POA: Diagnosis not present

## 2022-10-14 DIAGNOSIS — J9621 Acute and chronic respiratory failure with hypoxia: Secondary | ICD-10-CM | POA: Diagnosis not present

## 2022-10-14 DIAGNOSIS — I5043 Acute on chronic combined systolic (congestive) and diastolic (congestive) heart failure: Secondary | ICD-10-CM | POA: Diagnosis not present

## 2022-10-14 LAB — GLUCOSE, CAPILLARY
Glucose-Capillary: 92 mg/dL (ref 70–99)
Glucose-Capillary: 157 mg/dL — ABNORMAL HIGH (ref 70–99)
Glucose-Capillary: 158 mg/dL — ABNORMAL HIGH (ref 70–99)
Glucose-Capillary: 127 mg/dL — ABNORMAL HIGH (ref 70–99)

## 2022-10-14 LAB — RENAL FUNCTION PANEL
Albumin: 3.5 g/dL (ref 3.5–5.0)
Anion gap: 10 (ref 5–15)
BUN: 23 mg/dL (ref 8–23)
CO2: 22 mmol/L (ref 22–32)
Calcium: 10.5 mg/dL — ABNORMAL HIGH (ref 8.9–10.3)
Chloride: 108 mmol/L (ref 98–111)
Creatinine, Ser: 1.41 mg/dL — ABNORMAL HIGH (ref 0.44–1.00)
GFR, Estimated: 37 mL/min — ABNORMAL LOW (ref >60)
Glucose, Bld: 104 mg/dL — ABNORMAL HIGH (ref 70–99)
Phosphorus: 2.7 mg/dL (ref 2.5–4.6)
Potassium: 4.9 mmol/L (ref 3.5–5.1)
Sodium: 140 mmol/L (ref 135–145)

## 2022-10-14 LAB — CBC
HCT: 32 % — ABNORMAL LOW (ref 36.0–46.0)
Hemoglobin: 9.6 g/dL — ABNORMAL LOW (ref 12.0–15.0)
MCH: 24.1 pg — ABNORMAL LOW (ref 26.0–34.0)
MCHC: 30 g/dL (ref 30.0–36.0)
MCV: 80.4 fL (ref 80.0–100.0)
Platelets: 260 10*3/uL (ref 150–400)
RBC: 3.98 MIL/uL (ref 3.87–5.11)
RDW: 21.3 % — ABNORMAL HIGH (ref 11.5–15.5)
WBC: 8.3 10*3/uL (ref 4.0–10.5)
nRBC: 0.5 % — ABNORMAL HIGH (ref 0.0–0.2)

## 2022-10-14 MED ORDER — SACUBITRIL-VALSARTAN 24-26 MG PO TABS
1.0000 | ORAL_TABLET | Freq: Two times a day (BID) | ORAL | Status: DC
  Administered 2022-10-14: 1 via ORAL
  Filled 2022-10-14 (×2): qty 1

## 2022-10-14 MED ORDER — EMPAGLIFLOZIN 10 MG PO TABS
10.0000 mg | ORAL_TABLET | Freq: Every day | ORAL | Status: DC
  Administered 2022-10-14 – 2022-10-15 (×2): 10 mg via ORAL
  Filled 2022-10-14 (×2): qty 1

## 2022-10-14 MED ORDER — SPIRONOLACTONE 25 MG PO TABS: 25.0000 mg | ORAL_TABLET | Freq: Every day | ORAL | Status: DC

## 2022-10-14 NOTE — Discharge Summary (Signed)
Name: Caitlyn Aguirre MRN: [id number] DOB: [birth year] 87 y.o. PCP: [name], MD  Date of Admission: 10/08/2022 10:23 PM Date of Discharge: 10/14/2022 Attending Physician: Dr. Antony Contras  Discharge Diagnosis: Principal Problem:   Acute on chronic combined systolic and diastolic CHF (congestive heart failure) (HCC) Active Problems:   COPD (HCC)   AKI (acute kidney injury) (HCC)   CKD (chronic kidney disease) stage 3, GFR 30-59 ml/min (HCC)   ILD (interstitial lung disease) (HCC)   Hearing loss   Acute on chronic respiratory failure (HCC)   Iron deficiency anemia    Discharge Medications: Allergies as of 10/15/2022   No Known Allergies      Medication List     STOP taking these medications    amLODipine 5 MG tablet Commonly known as: NORVASC   mirtazapine 7.5 MG tablet Commonly known as: REMERON       TAKE these medications    Accu-Chek Guide Me w/Device Kit Check 1 times a day as instructed   Accu-Chek Softclix Lancets lancets Check 1 time a day as instructed   albuterol 108 (90 Base) MCG/ACT inhaler Commonly known as: VENTOLIN HFA Inhale 1-2 puffs into the lungs every 6 (six) hours as needed for wheezing or shortness of breath.   atorvastatin 40 MG tablet Commonly known as: LIPITOR Take 1 tablet (40 mg total) by mouth daily.   Entresto 49-51 MG Generic drug: sacubitril-valsartan Take 1 tablet by mouth 2 (two) times daily.   furosemide 40 MG tablet Commonly known as: Lasix Take 1 tablet (40 mg total) by mouth daily as needed (for weight gain greater than 3 lbs in a day or 5 lbs in a week).   Jardiance 10 MG Tabs tablet Generic drug: empagliflozin Take 1 tablet (10 mg total) by mouth daily.   levothyroxine 75 MCG tablet Commonly known as: SYNTHROID Take 1 tablet (75 mcg total) by mouth daily.   metFORMIN 1000 MG tablet Commonly known as: GLUCOPHAGE Take 1 tablet (1,000 mg total) by mouth daily with breakfast.   omeprazole 20 MG  capsule Commonly known as: PRILOSEC Take 1 capsule (20 mg total) by mouth daily.   OXYGEN Inhale 2 L into the lungs daily as needed (for breathing).   PARoxetine 30 MG tablet Commonly known as: PAXIL Take 1 tablet (30 mg total) by mouth daily.   tiotropium 18 MCG inhalation capsule Commonly known as: Spiriva HandiHaler Place 1 capsule (18 mcg total) into inhaler and inhale daily. INHALE THE CONTENTS OF 1  CAPSULE BY MOUTH VIA  HANDIHALER DAILY Strength: 18 mcg               Durable Medical Equipment  (From admission, onward)           Start     Ordered   10/13/22 1447  For home use only DME Bedside commode  Once       Comments: Needs 3:1  Question:  Patient needs a bedside commode to treat with the following condition  Answer:  Generalized weakness   10/13/22 1446   10/13/22 1446  For home use only DME 4 wheeled rolling walker with seat  Once       Question:  Patient needs a walker to treat with the following condition  Answer:  Generalized weakness   10/13/22 1446            Disposition and follow-up:   [name] was discharged from [facility] in stable condition.  At  the hospital follow up visit please address:  1.  Follow-up:  a. HFrEF - initiating GDMT, on enstresto and jardiance    b. AKI on Chronic CKD3a, resolved   c. MDD - remeron stopped recently   d. Iron Deficiency Anemia - Hgb stable, received 1500 mg Iron sucrose in hospital  2.  Labs / imaging needed at time of follow-up: BMP, CBC  3.  Pending labs/ test needing follow-up: none  4.  Medication Changes  Stopped amlodipine in favor of GDMT  Follow-up Appointments:  Follow-up Information     Corrin Parker, PA-C Follow up.   Specialty: Cardiology Why: Hospital follow-up with Cardiology scheduled for 11/03/2022 at 1:55pm. Please arrive 15 minutes early for check-in. If this date/ time does not work for you, please call our office to reschedule. Contact  information: [address] [place] [place] [address] [phone]         [facility]. Go in 14 day(s).   Specialty: Cardiology Why: Post hospital follow up please bring list fo current medication list free valet parking,[address] Contact information: [address] [place] [address] [phone]        [facility], [facility] Follow up.   Why: [employer] will provide Rolator and 3:1 to be delivered to the hospital room before she is discharged home. Contact information: [address] [place] [address] [phone]         [facility], [facility] Follow up.   Specialty: Home Health Services Why: [facility] will provide home health services.  They will call you in the next 24-48 hours to set up services. Contact information: [address] [place] [address] [phone]         [facility] Follow up on 11/02/2022.   Why: Dr Oswaldo Done folllow up. 9/30 at 8:45a Contact information: [address] [place] [address] [phone]                Hospital Course by problem list:   Acute on chronic combined systolic and diastolic CHF (congestive heart failure) (HCC) RV failure in setting of cor pulmonale.  Left heart is well compensated. assistance.   -TTE shows new reduced EF 30-35% and R heart cath confirms RV failure and Pulm HTN.  -started on entresto, jardiance, furosemide PRN -- not candidate for spironolactone currently per cardiology    Acute on chronic respiratory failure (HCC) Resolved.  Back on home supplemental oxygen.     Iron deficiency anemia Status post 1500 mg of iron sucrose.  AKI (acute kidney injury) (HCC) -return to baseline at discharge     CKD (chronic kidney disease) stage 3, GFR 30-59 ml/min (HCC) - Per above    ILD (interstitial lung disease) (HCC) Chronic and stable      COPD (HCC) - Revefenacin via neb - Levalbuterol as needed     Hearing loss Chronic and stable   Discharge Subjective: On day of discharge pt was reported significant improve of dyspnea since admission and she is agreeable to discharge. During the hospitalization she was experiencing platypnea and this is also improved 48 prior to discharge. She was educated extensively on her new medications for HF especially the furosemide PRN. Daughters are present at bedside and also feel she is stable for discharge. Follow up with cardio, PCP, and home health PT/OT is scheduled and provided to patient.   Discharge Exam:  BP 130/85 (BP Location: Left Arm)   Pulse 94   Temp 98.3 F (36.8 C) (Oral)   Resp 16   Wt 85.9 kg   SpO2 99%   BMI 28.79 kg/m  Constitutional: well-appearing  sitting in , in no acute distress HENT: normocephalic atraumatic, mucous membranes moist Eyes: conjunctiva non-erythematous Neck: supple Cardiovascular: regular rate and rhythm, no m/r/g Pulmonary/Chest: normal work of breathing on room air, continued crackles but improved.  Abdominal: soft, non-tender, non-distended MSK: normal bulk and tone Neurological: alert & oriented x 3, 5/5 strength in bilateral upper and lower extremities, normal gait Skin: warm and dry Psych: affect euthymic.   Pertinent Labs, Studies, and Procedures:     Latest Ref Rng & Units 10/14/2022    8:25 AM 10/13/2022    4:44 AM 10/12/2022   12:28 PM  CBC  WBC 4.0 - 10.5 K/uL 8.3  8.1    Hemoglobin 12.0 - 15.0 g/dL 9.6  8.8  95.2    84.1   Hematocrit 36.0 - 46.0 % 32.0  29.2  30.0    30.0   Platelets 150 - 400 K/uL 260  244         Latest Ref Rng & Units 10/15/2022    4:26 AM 10/14/2022    8:25 AM 10/13/2022    4:44 AM  CMP  Glucose 70 - 99 mg/dL 324  401  027   BUN 8 - 23 mg/dL 20  23  29    Creatinine 0.44 - 1.00 mg/dL 2.53  6.64  4.03   Sodium 135 - 145 mmol/L 139  140  139   Potassium 3.5 - 5.1 mmol/L 4.7  4.9  5.1   Chloride 98 -  111 mmol/L 109  108  110   CO2 22 - 32 mmol/L 24  22  23    Calcium 8.9 - 10.3 mg/dL 47.4  25.9  56.3     ECHOCARDIOGRAM COMPLETE  Result Date: 10/09/2022    ECHOCARDIOGRAM REPORT   Patient Name:   Caitlyn Aguirre Date of Exam: 10/09/2022 Medical Rec #:  [id number]      Height:       68.0 in Accession #:    [id number]     Weight:       188.9 lb Date of Birth:  Feb 25, 1935       BSA:          1.995 m Patient Age:    87 years       BP:           150/67 mmHg Patient Gender: F              HR:           77 bpm. Exam Location:  Inpatient Procedure: 2D Echo, Color Doppler, Cardiac Doppler and Intracardiac            Opacification Agent Indications:    Dyspnea R06.00  History:        Patient has prior history of Echocardiogram examinations, most                 recent 05/31/2021. CHF, Previous Myocardial Infarction, COPD and                 Stroke; Risk Factors:Former Smoker, Diabetes, Hypertension and                 Sleep Apnea.  Sonographer:    Aron Baba Referring Phys: [id number] Inez Catalina  Sonographer Comments:  Image acquisition challenging due to respiratory motion. IMPRESSIONS  1. Left ventricular ejection fraction, by estimation, is 30 to 35%. The left ventricle has moderately decreased function. The left ventricle demonstrates global hypokinesis. Left ventricular diastolic parameters are consistent with Grade I diastolic dysfunction (impaired relaxation). There is the interventricular septum is flattened in systole and diastole, consistent with right ventricular pressure and volume overload.  2. Right ventricular systolic function is mildly reduced. The right ventricular size is normal. There is moderately elevated pulmonary artery systolic pressure. The estimated right ventricular systolic pressure is 47.4 mmHg.  3. The mitral valve is abnormal. Trivial mitral valve regurgitation.  4. The tricuspid valve is abnormal. Tricuspid valve regurgitation is mild to moderate.  5. The aortic valve is tricuspid. Aortic  valve regurgitation is not visualized.  6. The pulmonic valve was abnormal.  7. The inferior vena cava is normal in size with <50% respiratory variability, suggesting right atrial pressure of 8 mmHg. Comparison(s): Changes from prior study are noted. 05/31/2021: LVEF 55-60%, RVSP 22.9 mmHg. FINDINGS  Left Ventricle: Left ventricular ejection fraction, by estimation, is 30 to 35%. The left ventricle has moderately decreased function. The left ventricle demonstrates global hypokinesis. Definity contrast agent was given IV to delineate the left ventricular endocardial borders. The left ventricular internal cavity size was normal in size. There is no left ventricular hypertrophy. The interventricular septum is flattened in systole and diastole, consistent with right ventricular pressure and volume overload. Left ventricular diastolic parameters are consistent with Grade I diastolic dysfunction (impaired relaxation). Indeterminate filling pressures. Right Ventricle: The right ventricular size is normal. No increase in right ventricular wall thickness. Right ventricular systolic function is mildly reduced. There is moderately elevated pulmonary artery systolic pressure. The tricuspid regurgitant velocity is 3.14 m/s, and with an assumed right atrial pressure of 8 mmHg, the estimated right ventricular systolic pressure is 47.4 mmHg. Left Atrium: Left atrial size was normal in size. Right Atrium: Right atrial size was normal in size. Pericardium: There is no evidence of pericardial effusion. Mitral Valve: The mitral valve is abnormal. Mild mitral annular calcification. Trivial mitral valve regurgitation. Tricuspid Valve: The tricuspid valve is abnormal. Tricuspid valve regurgitation is mild to moderate. Aortic Valve: The aortic valve is tricuspid. Aortic valve regurgitation is not visualized. Pulmonic Valve: The pulmonic valve was abnormal. Pulmonic valve regurgitation is mild to moderate. Aorta: The aortic root and  ascending aorta are structurally normal, with no evidence of dilitation. Venous: The inferior vena cava is normal in size with less than 50% respiratory variability, suggesting right atrial pressure of 8 mmHg. IAS/Shunts: No atrial level shunt detected by color flow Doppler.  LEFT VENTRICLE PLAX 2D LVIDd:         4.70 cm      Diastology LVIDs:         4.60 cm      LV e' medial:    6.53 cm/s LV PW:         1.00 cm      LV E/e' medial:  6.0 LV IVS:        0.80 cm      LV e' lateral:   5.44 cm/s LVOT diam:     2.10 cm      LV E/e' lateral: 7.2 LV SV:         39 LV SV Index:   20 LVOT Area:     3.46 cm  LV Volumes (MOD) LV vol d, MOD A2C: 116.0 ml LV vol d, MOD  A4C: 120.0 ml LV vol s, MOD A2C: 74.8 ml LV vol s, MOD A4C: 88.5 ml LV SV MOD A2C:     41.2 ml LV SV MOD A4C:     120.0 ml LV SV MOD BP:      34.5 ml RIGHT VENTRICLE RV S prime:     9.57 cm/s TAPSE (M-mode): 1.9 cm LEFT ATRIUM             Index        RIGHT ATRIUM           Index LA diam:        2.60 cm 1.30 cm/m   RA Area:     19.10 cm LA Vol (A2C):   15.5 ml 7.77 ml/m   RA Volume:   54.90 ml  27.52 ml/m LA Vol (A4C):   29.5 ml 14.79 ml/m LA Biplane Vol: 21.6 ml 10.83 ml/m  AORTIC VALVE             PULMONIC VALVE LVOT Vmax:   76.50 cm/s  PR End Diast Vel: 6.55 msec LVOT Vmean:  50.300 cm/s LVOT VTI:    0.113 m  AORTA Ao Root diam: 3.40 cm Ao Asc diam:  3.30 cm MITRAL VALVE                TRICUSPID VALVE MV Area (PHT): 5.75 cm     TR Peak grad:   39.4 mmHg MV Decel Time: 132 msec     TR Vmax:        314.00 cm/s MV E velocity: 39.30 cm/s MV A velocity: 121.00 cm/s  SHUNTS MV E/A ratio:  0.32         Systemic VTI:  0.11 m                             Systemic Diam: 2.10 cm Zoila Shutter MD Electronically signed by Zoila Shutter MD Signature Date/Time: 10/09/2022/11:06:21 AM    Final    DG Chest Portable 1 View  Result Date: 10/08/2022 CLINICAL DATA:  Shortness of breath EXAM: PORTABLE CHEST 1 VIEW COMPARISON:  09/26/2021, CT 08/13/2022 FINDINGS: Emphysema  and chronic fibrotic lung disease and bronchiectasis. Mildly enlarged cardiomediastinal silhouette. No pleural effusion or pneumothorax. No acute superimposed confluent airspace disease. IMPRESSION: Emphysema and chronic fibrotic lung disease. No definite superimposed acute airspace disease Electronically Signed   By: Jasmine Pang M.D.   On: 10/08/2022 23:24     Discharge Instructions: Discharge Instructions     Diet - low sodium heart healthy   Complete by: As directed    Increase activity slowly   Complete by: As directed        Signed: Meryl Dare, MD PGY-1 Psychiatry Resident 10/15/2022, 1:53 PM

## 2022-10-14 NOTE — Progress Notes (Addendum)
                 Interval history She feels better and his walking around with significnat SOB, feels she is back to her baseline from a breathing standpoint. Informed her that Dr. Elease Hashimoto would trial her on a medciation tonight and if she is tolerating well discharge her tomorrow. Also informed her that the kidney function is improving.   Physical exam Blood pressure 137/70, pulse 82, temperature 98.3 F (36.8 C), temperature source Oral, resp. rate 16, weight 92.2 kg, SpO2 95%.  No distress Heart rate is normal, rhythm is regular, strong peripheral pulses, no lower extremity edema. CRT <3s Breathing is regular and unlabored on room air, no crackles or wheezes in bilateral lungs Skin is warm and dry.  Alert and oriented  Labs, images, and other studies Cr 1.64->1.56->1.41 CBC Hgb stable 9.6  Assessment and plan Hospital day 5  Caitlyn Aguirre is a 87 y.o. admitted for acute on chronic respiratory failure due to cor pulmonale with biventricular heart failure.  Principal Problem:   Acute on chronic combined systolic and diastolic CHF (congestive heart failure) (HCC) RV failure in setting of cor pulmonale.  Left heart is well compensated.  Best to hold diuretics as she is euvolemic and probably preload dependent.  Slowly adding back GDMT, monitor for side effects and decreased renal function.  Appreciate cardiology assistance.   - Switch losartan to entresto tonight per cardiology -- started jardiance for DM and CHF -- furosemide PRN for weight gain  -- stable to discharge tomorrow if tolerating medication changes per cards  Active Problems: AKI (acute kidney injury) (HCC), improving  Creatinine downtrending, 1.41 (baseline unclear but 1.2-.3) - A.m. BMP    CKD (chronic kidney disease) stage 3, GFR 30-59 ml/min (HCC) - Per above   ILD (interstitial lung disease) (HCC) Chronic and stable    COPD (HCC) - Revefenacin via neb - Levalbuterol as needed    Hearing loss Chronic  and stable    Acute on chronic respiratory failure (HCC) Resolved.  Back on home supplemental oxygen.    Iron deficiency anemia Status post 1500 mg of iron sucrose.  Diet: Carb modified IVF: N/A VTE: enoxaparin (LOVENOX) injection 30 mg Start: 10/11/22 1000  Code: Full PT/OT recommendations: Home health TOC recommendations: Home health referrals placed, appreciate assistance Family Update: At bedside  Discharge plan: Hopeful for tomorrow, uptitrating GDMT  Meryl Dare, MD PGY-1 Psychiatry Resident 10/14/2022, 11:26 AM Pager: [phone] After 5pm or weekend: [phone]

## 2022-10-14 NOTE — Progress Notes (Signed)
Rounding Note    Patient Name: Caitlyn Aguirre Date of Encounter: 10/14/2022  [facility] Cardiologist: Bjorn Pippin   Subjective   87 yo female with hx of chronic combined CHF, pulmonary fibrosis Pulmonary HTN  Right heart cath yesterday showed normal RA pressures, normla wedge pressure  Moderate - severe pulmonary HTN  Seems to be doing well  Will continue to add GDMT ( Jardiance today ,  start Entresto 24-26 BID tonight  DC losartan      Inpatient Medications    Scheduled Meds:  atorvastatin  40 mg Oral Daily   empagliflozin  10 mg Oral Daily   enoxaparin (LOVENOX) injection  30 mg Subcutaneous Q24H   insulin aspart  0-5 Units Subcutaneous QHS   insulin aspart  0-9 Units Subcutaneous TID WC   levothyroxine  75 mcg Oral Daily   losartan  25 mg Oral Daily   pantoprazole  40 mg Oral Daily   PARoxetine  30 mg Oral Daily   revefenacin  175 mcg Nebulization Daily   senna-docusate  1 tablet Oral QHS   sodium chloride flush  3 mL Intravenous Q12H   spironolactone  25 mg Oral Daily   Continuous Infusions:  sodium chloride     PRN Meds: sodium chloride, acetaminophen **OR** acetaminophen, levalbuterol, sodium chloride flush   Vital Signs    Vitals:   10/14/22 0435 10/14/22 0506 10/14/22 0724 10/14/22 0930  BP: 119/67  137/70   Pulse: 77  82   Resp: 17  16   Temp: 98.3 F (36.8 C)  98.3 F (36.8 C)   TempSrc: Oral  Oral   SpO2: 95%  93% 95%  Weight:  92.2 kg      Intake/Output Summary (Last 24 hours) at 10/14/2022 1015 Last data filed at 10/13/2022 1700 Gross per 24 hour  Intake 400 ml  Output --  Net 400 ml      10/14/2022    5:06 AM 10/13/2022    6:13 AM 10/12/2022    5:00 AM  Last 3 Weights  Weight (lbs) 203 lb 4.2 oz 186 lb 8.2 oz 186 lb 8.2 oz  Weight (kg) 92.2 kg 84.6 kg 84.6 kg      Telemetry    NSR  - Personally Reviewed  ECG     - Personally Reviewed  Physical Exam   Physical Exam: Blood pressure 137/70, pulse 82,  temperature 98.3 F (36.8 C), temperature source Oral, resp. rate 16, weight 92.2 kg, SpO2 95%.      GEN: elderly female , NAD , sitting up in her chair HEENT: Normal NECK: No JVD; No carotid bruits LYMPHATICS: No lymphadenopathy CARDIAC:RR , soft murmur RESPIRATORY:   bilateral rales,  ABDOMEN: Soft, non-tender, non-distended MUSCULOSKELETAL:  no edema  SKIN: Warm and dry NEUROLOGIC:  Alert and oriented x 3  Labs    High Sensitivity Troponin:   Recent Labs  Lab 10/08/22 2237 10/09/22 0035  TROPONINIHS 15 15     Chemistry Recent Labs  Lab 10/08/22 2237 10/09/22 0755 10/10/22 0852 10/11/22 0716 10/11/22 0720 10/12/22 0651 10/12/22 1228 10/13/22 0444 10/14/22 0825  NA 143 141 139  --    < > 140 143  143 139 140  K 4.4 4.5 4.8  --    < > 5.0 4.9  4.9 5.1 4.9  CL 112* 111 107  --    < > 111  --  110 108  CO2 20* 23 21*  --    < >  22  --  23 22  GLUCOSE 172* 137* 134*  --    < > 134*  --  129* 104*  BUN 16 17 29*  --    < > 30*  --  29* 23  CREATININE 1.42* 1.47* 1.65*  --    < > 1.64*  --  1.56* 1.41*  CALCIUM 10.0 10.0 9.9  --    < > 9.9  --  10.0 10.5*  MG  --  1.3* 1.5* 1.8  --   --   --   --   --   PROT 6.8  --   --   --   --   --   --   --   --   ALBUMIN 3.3* 3.1*  --   --    < > 3.3*  --  3.1* 3.5  AST 27  --   --   --   --   --   --   --   --   ALT 17  --   --   --   --   --   --   --   --   ALKPHOS 96  --   --   --   --   --   --   --   --   BILITOT 0.8  --   --   --   --   --   --   --   --   GFRNONAA 36* 35* 30*  --    < > 30*  --  32* 37*  ANIONGAP 11 7 11   --    < > 7  --  6 10   < > = values in this interval not displayed.    Lipids No results for input(s): "CHOL", "TRIG", "HDL", "LABVLDL", "LDLCALC", "CHOLHDL" in the last 168 hours.  Hematology Recent Labs  Lab 10/12/22 0651 10/12/22 1228 10/13/22 0444 10/14/22 0825  WBC 8.4  --  8.1 8.3  RBC 3.72*  --  3.75* 3.98  HGB 8.5* 10.2*  10.2* 8.8* 9.6*  HCT 27.9* 30.0*  30.0* 29.2* 32.0*   MCV 75.0*  --  77.9* 80.4  MCH 22.8*  --  23.5* 24.1*  MCHC 30.5  --  30.1 30.0  RDW 19.9*  --  20.6* 21.3*  PLT 255  --  244 260   Thyroid  Recent Labs  Lab 10/09/22 0749  TSH 1.491    BNP Recent Labs  Lab 10/08/22 2237  BNP 512.5*    DDimer No results for input(s): "DDIMER" in the last 168 hours.   Radiology    CARDIAC CATHETERIZATION  Result Date: 10/12/2022 Conclusions: Normal left heart filling pressure. Mildly elevated right heart filling pressure. Moderate pulmonary hypertension. Normal Fick cardiac output. Moderately reduced thermodilution cardiac output. Recommendations: Continue to optimize goal-directed medical therapy for HFrEF. Yvonne Kendall, MD [facility]   Cardiac Studies     Patient Profile     87 y.o. female with chronic combined CHF, pulmonary fibrosis, pulmonary HTN, CKD, HTN, HLD , COPD   Assessment & Plan     Acute respiratory failure:   multifactorial.  She has chronic combined CHF with EF 30-35%.  Has pulmonary fibrosis with pulmonary HTN.  She appears euvolemic    2.   CKD:  creatinine appears stable   3.  Pulmonary fibrosis :   further management per pulmonary   4.  Chronic HFrEF:  EF is 30-35%.  She  is not having angina .   She is a poor candidate for invasive or interventional procedures.  Have started entresto, jardiance  Her potassium is a bit elevated.   Will hold off on starting Spironolactone for now  She may need some lasix to take PRN weight gain   For questions or updates, please contact [facility] Please consult [email or web address] for contact info under        Signed, Kristeen Miss, MD  10/14/2022, 10:15 AM

## 2022-10-14 NOTE — Addendum Note (Signed)
Addended by: Erlinda Hong T on: 10/14/2022 08:52 AM   Modules accepted: Orders

## 2022-10-14 NOTE — Discharge Instructions (Addendum)
Hi Mrs. Romberg,  Follow up with cardiology  Follow up with Dr. Oswaldo Done  Continue home health Call Dr. Oswaldo Done office [phone]) with any concerns and ask for the on call doctor OR go to the emergency department.  Okay to eat your teaspoon of starch but try to avoid it whenever possible Follow the sodium (salt) recommendations per cardiology  Take care,  Internal Medicine Teaching Service Team

## 2022-10-14 NOTE — Progress Notes (Signed)
   Heart Failure Stewardship Pharmacist Progress Note   PCP: [name], MD PCP-Cardiologist: None    HPI:  [name] with PMH of CHF, COPD, ILD, HTN, CKD IIIa, T2DM, MDD, polymyalgia rheumatica, anemia, and pulmonary hypertension.   Patient has a history of nonischemic cardiomyopathy with EF as low as 35-40% back in 1999. Cath at that time showed normal coronaries and she subsequently had normalization of EF. LVEF was 55-60% in 05/2021.   She presented to the ED on 9/6 with shortness of breath, orthopnea, lightheadedness, dizziness, and LE edema. BNP elevated 500s. CXR with emphysema and chronic fibrotic lung disease.  ECHO 9/6 showed LVEF 30-35%, global hypokinesis, G1DD, RV mildly reduced, moderately elevated PA pressure, trivial MR. Taken for RHC on 9/9 and RA 5, PA 28, wedge 1, Fick CO/CI 4.9/2.5, Thermo CO/CI 3.4/1.7. Unable to do LHC with worsening renal function.     Current HF Medications: ACE/ARB/ARNI: losartan 25 mg daily  Prior to admission HF Medications: None  Pertinent Lab Values: As of 9/10: Serum creatinine 1.56, BUN 29, Potassium 5.1, Sodium 139, BNP 512.5, Magnesium 1.8   Vital Signs: Weight: 203 lbs (admission weight: 188 lbs) Blood pressure: 120-130/70s  Heart rate: 70-80s  I/O: net +0.3L since admission  Medication Assistance / Insurance Benefits Check: Does the patient have prescription insurance?  Yes Type of insurance plan: [employer]  Outpatient Pharmacy:  Prior to admission outpatient pharmacy: [facility] Is the patient willing to use [facility] TOC pharmacy at discharge? Yes Is the patient willing to transition their outpatient pharmacy to utilize a [facility] outpatient pharmacy?   No    Assessment: 1. Acute on chronic systolic CHF (LVEF 30-35%). NYHA class II symptoms. - Volume improved. Wedge 1 on cath. Strict I/Os and daily weights. Keep K>4 and Mg>2. - Moderately reduced thermodilution cardiac output - caution adding BB. - Continue  losartan 25 mg daily - GDMT limited by AKI on CKD   Plan: 1) Medication changes recommended at this time: - Continue current regimen, anticipated d/c today  2) Patient assistance: - Entresto copay $0 - Jardiance/Farxiga copay $0  3)  Education  - Patient has been educated on current HF medications and potential additions to HF medication regimen - Patient verbalizes understanding that over the next few months, these medication doses may change and more medications may be added to optimize HF regimen - Patient has been educated on basic disease state pathophysiology and goals of therapy   Sharen Hones, PharmD, BCPS Heart Failure Stewardship Pharmacist Phone [phone]

## 2022-10-14 NOTE — Progress Notes (Signed)
Occupational Therapy Treatment Patient Details Name: Caitlyn Aguirre MRN: [id number] DOB: May 05, 1935 Today's Date: 10/14/2022   History of present illness 87 yo female admitted 9/5 with SOB and LB edema with CHF exacerbation. 9/10 R heart cath. PMhx: CHF, LVEF 55-60%, COPD, ILD on chronic 2L O2, HTN, CKD, T2DM, MDD, Polymyalgia rheumatica, pulmonary hypertension   OT comments  Pt was a very nice lady who agreed to session as motivated to go home with family support. Pt was educated on use of 4WW with o2 line management in session. As Caitlyn Aguirre doffed o2 when exiting the bed and she was educated about o2 use as her o2 did drop down to 80% on RA as pt reported at prior level she use to not have o2 on when going to the bathroom at baseline. Pt was able to complete toilet tasks and sponge bath at sink level with supervision to CGA.       If plan is discharge home, recommend the following:  A little help with walking and/or transfers;A little help with bathing/dressing/bathroom;Assistance with cooking/housework;Assist for transportation;Help with stairs or ramp for entrance;Direct supervision/assist for medications management   Equipment Recommendations  BSC/3in1;Tub/shower bench    Recommendations for Other Services      Precautions / Restrictions Precautions Precautions: Fall;Other (comment) Precaution Comments: watch sats Restrictions Weight Bearing Restrictions: No       Mobility Bed Mobility Overal bed mobility: Needs Assistance Bed Mobility: Supine to Sit     Supine to sit: Supervision, Used rails          Transfers Overall transfer level: Needs assistance Equipment used: Rollator (4 wheels) Transfers: Sit to/from Stand Sit to Stand: Supervision                 Balance Overall balance assessment: Needs assistance Sitting-balance support: No upper extremity supported, Feet supported Sitting balance-Leahy Scale: Good     Standing balance support: During  functional activity, Single extremity supported Standing balance-Leahy Scale: Fair Standing balance comment: Pt was able to complete some ADLS in standing without use of grab bar or 4WW but cued on position                           ADL either performed or assessed with clinical judgement   ADL Overall ADL's : Needs assistance/impaired Eating/Feeding: Independent;Sitting   Grooming: Wash/dry hands;Supervision/safety;Standing   Upper Body Bathing: Set up;Sitting   Lower Body Bathing: Contact guard assist;Sit to/from stand   Upper Body Dressing : Set up;Sitting   Lower Body Dressing: Contact guard assist;Sit to/from stand   Toilet Transfer: Supervision/safety;Ambulation;Rolling walker (2 wheels);Regular Toilet;Grab bars   Toileting- Clothing Manipulation and Hygiene: Supervision/safety;Sit to/from stand       Functional mobility during ADLs: Supervision/safety;Contact guard assist;Rollator (4 wheels) General ADL Comments: Pt needed cues on saftey with new 4WW and management of o2 tubbing materials    Extremity/Trunk Assessment Upper Extremity Assessment Upper Extremity Assessment: Generalized weakness   Lower Extremity Assessment Lower Extremity Assessment: Defer to PT evaluation        Vision   Vision Assessment?: No apparent visual deficits   Perception Perception Perception: Not tested   Praxis Praxis Praxis: Not tested    Cognition Arousal: Alert Behavior During Therapy: [facility] for tasks assessed/performed Overall Cognitive Status: Within Functional Limits for tasks assessed  Exercises      Shoulder Instructions       General Comments      Pertinent Vitals/ Pain       Pain Assessment Pain Assessment: Faces Pain Score: 0-No pain Faces Pain Scale: No hurt  Home Living                                          Prior Functioning/Environment               Frequency  Min 1X/week        Progress Toward Goals  OT Goals(current goals can now be found in the care plan section)  Progress towards OT goals: Progressing toward goals  Acute Rehab OT Goals Patient Stated Goal: to go home today OT Goal Formulation: With patient Time For Goal Achievement: 10/23/22 Potential to Achieve Goals: Good ADL Goals Pt Will Perform Grooming: with modified independence;standing Pt Will Perform Lower Body Bathing: sit to/from stand;with supervision Pt Will Perform Lower Body Dressing: with supervision;sit to/from stand Pt Will Transfer to Toilet: with modified independence;bedside commode Pt Will Perform Toileting - Clothing Manipulation and hygiene: with modified independence;sit to/from stand Pt Will Perform Tub/Shower Transfer: with supervision;tub bench;Tub transfer;rolling walker;ambulating Pt/caregiver will Perform Home Exercise Program: Increased strength;Both right and left upper extremity;With written HEP provided;With theraband  Plan      Co-evaluation                 AM-PAC OT "6 Clicks" Daily Activity     Outcome Measure   Help from another person eating meals?: None Help from another person taking care of personal grooming?: A Little Help from another person toileting, which includes using toliet, bedpan, or urinal?: A Little Help from another person bathing (including washing, rinsing, drying)?: A Little Help from another person to put on and taking off regular upper body clothing?: A Little Help from another person to put on and taking off regular lower body clothing?: A Little 6 Click Score: 19    End of Session Equipment Utilized During Treatment: Gait belt;Rollator (4 wheels)  OT Visit Diagnosis: Unsteadiness on feet (R26.81);Other abnormalities of gait and mobility (R26.89);Muscle weakness (generalized) (M62.81) Pain - Right/Left:  (none)   Activity Tolerance Patient tolerated treatment well   Patient Left in  chair;with call bell/phone within reach;with chair alarm set;Other (comment) (lab in room and daughter)   Nurse Communication Mobility status        Time: 724 090 [id number] OT Time Calculation (min): 37 min  Charges: OT General Charges $OT Visit: 1 Visit OT Treatments $Self Care/Home Management : 23-37 mins  Presley Raddle OTR/L  Acute Rehab Services  [phone] office number   Alphia Moh 10/14/2022, 8:31 AM

## 2022-10-14 NOTE — Plan of Care (Signed)

## 2022-10-15 ENCOUNTER — Other Ambulatory Visit (HOSPITAL_COMMUNITY): Payer: Self-pay

## 2022-10-15 DIAGNOSIS — D509 Iron deficiency anemia, unspecified: Secondary | ICD-10-CM

## 2022-10-15 DIAGNOSIS — N1831 Chronic kidney disease, stage 3a: Secondary | ICD-10-CM

## 2022-10-15 DIAGNOSIS — N179 Acute kidney failure, unspecified: Secondary | ICD-10-CM | POA: Diagnosis not present

## 2022-10-15 DIAGNOSIS — I272 Pulmonary hypertension, unspecified: Secondary | ICD-10-CM | POA: Diagnosis not present

## 2022-10-15 DIAGNOSIS — I5043 Acute on chronic combined systolic (congestive) and diastolic (congestive) heart failure: Secondary | ICD-10-CM | POA: Diagnosis not present

## 2022-10-15 LAB — RENAL FUNCTION PANEL
Albumin: 3.1 g/dL — ABNORMAL LOW (ref 3.5–5.0)
Anion gap: 6 (ref 5–15)
BUN: 20 mg/dL (ref 8–23)
CO2: 24 mmol/L (ref 22–32)
Calcium: 10.2 mg/dL (ref 8.9–10.3)
Chloride: 109 mmol/L (ref 98–111)
Creatinine, Ser: 1.38 mg/dL — ABNORMAL HIGH (ref 0.44–1.00)
GFR, Estimated: 38 mL/min — ABNORMAL LOW (ref >60)
Glucose, Bld: 104 mg/dL — ABNORMAL HIGH (ref 70–99)
Phosphorus: 2.5 mg/dL (ref 2.5–4.6)
Potassium: 4.7 mmol/L (ref 3.5–5.1)
Sodium: 139 mmol/L (ref 135–145)

## 2022-10-15 LAB — MAGNESIUM: Magnesium: 1.6 mg/dL — ABNORMAL LOW (ref 1.7–2.4)

## 2022-10-15 LAB — GLUCOSE, CAPILLARY: Glucose-Capillary: 160 mg/dL — ABNORMAL HIGH (ref 70–99)

## 2022-10-15 MED ORDER — SACUBITRIL-VALSARTAN 49-51 MG PO TABS
1.0000 | ORAL_TABLET | Freq: Two times a day (BID) | ORAL | 2 refills | Status: DC
  Filled 2022-10-15: qty 60, 30d supply, fill #0

## 2022-10-15 MED ORDER — FUROSEMIDE 40 MG PO TABS
40.0000 mg | ORAL_TABLET | Freq: Every day | ORAL | 0 refills | Status: DC | PRN
  Filled 2022-10-15: qty 30, 30d supply, fill #0

## 2022-10-15 MED ORDER — SACUBITRIL-VALSARTAN 49-51 MG PO TABS
1.0000 | ORAL_TABLET | Freq: Two times a day (BID) | ORAL | Status: DC
  Administered 2022-10-15: 1 via ORAL
  Filled 2022-10-15: qty 1

## 2022-10-15 MED ORDER — EMPAGLIFLOZIN 10 MG PO TABS
10.0000 mg | ORAL_TABLET | Freq: Every day | ORAL | 2 refills | Status: DC
  Filled 2022-10-15: qty 30, 30d supply, fill #0

## 2022-10-15 NOTE — Progress Notes (Signed)
Rounding Note    Patient Name: Caitlyn Aguirre Date of Encounter: 10/15/2022  [facility] Cardiologist: Bjorn Pippin   Subjective   87 yo female with hx of chronic combined CHF, pulmonary fibrosis Pulmonary HTN  Right heart cath yesterday showed normal RA pressures, normla wedge pressure  Moderate - severe pulmonary HTN  Seems to be doing well  Will continue to add GDMT ( Jardiance today ,  start Entresto   DC losartan   Will DC today      Inpatient Medications    Scheduled Meds:  atorvastatin  40 mg Oral Daily   empagliflozin  10 mg Oral Daily   enoxaparin (LOVENOX) injection  30 mg Subcutaneous Q24H   insulin aspart  0-5 Units Subcutaneous QHS   insulin aspart  0-9 Units Subcutaneous TID WC   levothyroxine  75 mcg Oral Daily   pantoprazole  40 mg Oral Daily   PARoxetine  30 mg Oral Daily   revefenacin  175 mcg Nebulization Daily   sacubitril-valsartan  1 tablet Oral BID   senna-docusate  1 tablet Oral QHS   sodium chloride flush  3 mL Intravenous Q12H   Continuous Infusions:  sodium chloride     PRN Meds: sodium chloride, acetaminophen **OR** acetaminophen, levalbuterol, sodium chloride flush   Vital Signs    Vitals:   10/15/22 0047 10/15/22 0439 10/15/22 0443 10/15/22 0740  BP: (!) 140/78 (!) 154/89  130/85  Pulse: 85 95  94  Resp: 18 18  16   Temp: 98.4 F (36.9 C) 98 F (36.7 C)  98.3 F (36.8 C)  TempSrc: Oral Oral  Oral  SpO2: 97% 95%  99%  Weight:   85.9 kg     Intake/Output Summary (Last 24 hours) at 10/15/2022 1054 Last data filed at 10/14/2022 1800 Gross per 24 hour  Intake 375 ml  Output --  Net 375 ml      10/15/2022    4:43 AM 10/14/2022    5:06 AM 10/13/2022    6:13 AM  Last 3 Weights  Weight (lbs) 189 lb 6 oz 203 lb 4.2 oz 186 lb 8.2 oz  Weight (kg) 85.9 kg 92.2 kg 84.6 kg      Telemetry    NSR  - Personally Reviewed  ECG     - Personally Reviewed  Physical Exam   Physical Exam: Blood pressure 130/85, pulse  94, temperature 98.3 F (36.8 C), temperature source Oral, resp. rate 16, weight 85.9 kg, SpO2 99%.      GEN: elderly female , NAD , sitting up in her chair HEENT: Normal NECK: No JVD; No carotid bruits LYMPHATICS: No lymphadenopathy CARDIAC:RR , soft murmur RESPIRATORY:   bilateral rales,  ABDOMEN: Soft, non-tender, non-distended MUSCULOSKELETAL:  no edema  SKIN: Warm and dry NEUROLOGIC:  Alert and oriented x 3  Labs    High Sensitivity Troponin:   Recent Labs  Lab 10/08/22 2237 10/09/22 0035  TROPONINIHS 15 15     Chemistry Recent Labs  Lab 10/08/22 2237 10/09/22 0755 10/10/22 0852 10/11/22 0716 10/11/22 0720 10/13/22 0444 10/14/22 0825 10/15/22 0426  NA 143   < > 139  --    < > 139 140 139  K 4.4   < > 4.8  --    < > 5.1 4.9 4.7  CL 112*   < > 107  --    < > 110 108 109  CO2 20*   < > 21*  --    < >  23 22 24   GLUCOSE 172*   < > 134*  --    < > 129* 104* 104*  BUN 16   < > 29*  --    < > 29* 23 20  CREATININE 1.42*   < > 1.65*  --    < > 1.56* 1.41* 1.38*  CALCIUM 10.0   < > 9.9  --    < > 10.0 10.5* 10.2  MG  --    < > 1.5* 1.8  --   --   --  1.6*  PROT 6.8  --   --   --   --   --   --   --   ALBUMIN 3.3*   < >  --   --    < > 3.1* 3.5 3.1*  AST 27  --   --   --   --   --   --   --   ALT 17  --   --   --   --   --   --   --   ALKPHOS 96  --   --   --   --   --   --   --   BILITOT 0.8  --   --   --   --   --   --   --   GFRNONAA 36*   < > 30*  --    < > 32* 37* 38*  ANIONGAP 11   < > 11  --    < > 6 10 6    < > = values in this interval not displayed.    Lipids No results for input(s): "CHOL", "TRIG", "HDL", "LABVLDL", "LDLCALC", "CHOLHDL" in the last 168 hours.  Hematology Recent Labs  Lab 10/12/22 0651 10/12/22 1228 10/13/22 0444 10/14/22 0825  WBC 8.4  --  8.1 8.3  RBC 3.72*  --  3.75* 3.98  HGB 8.5* 10.2*  10.2* 8.8* 9.6*  HCT 27.9* 30.0*  30.0* 29.2* 32.0*  MCV 75.0*  --  77.9* 80.4  MCH 22.8*  --  23.5* 24.1*  MCHC 30.5  --  30.1 30.0   RDW 19.9*  --  20.6* 21.3*  PLT 255  --  244 260   Thyroid  Recent Labs  Lab 10/09/22 0749  TSH 1.491    BNP Recent Labs  Lab 10/08/22 2237  BNP 512.5*    DDimer No results for input(s): "DDIMER" in the last 168 hours.   Radiology    No results found.  Cardiac Studies     Patient Profile     87 y.o. female with chronic combined CHF, pulmonary fibrosis, pulmonary HTN, CKD, HTN, HLD , COPD   Assessment & Plan     Acute respiratory failure:   multifactorial.  She has chronic combined CHF with EF 30-35%.  Has pulmonary fibrosis with pulmonary HTN.  She remains euvolemic .     2.   CKD:  creatinine appears stable   3.  Pulmonary fibrosis :   further management per pulmonary   4.  Chronic HFrEF:  EF is 30-35%.  She is not having angina .   She is a poor candidate for invasive or interventional procedures.  Have started entresto, jardiance  On entresto 49-51 BID ,jardiance, lasix as needed for weight gain, dyspnea or leg swelling   Will follow up with the CHF [facility] clinic and with Dr. Bjorn Pippin / APP .     For  questions or updates, please contact [facility] Please consult [email or web address] for contact info under        Signed, Kristeen Miss, MD  10/15/2022, 10:54 AM

## 2022-10-16 DIAGNOSIS — M79604 Pain in right leg: Secondary | ICD-10-CM | POA: Diagnosis not present

## 2022-10-16 DIAGNOSIS — J449 Chronic obstructive pulmonary disease, unspecified: Secondary | ICD-10-CM | POA: Diagnosis not present

## 2022-10-16 DIAGNOSIS — R296 Repeated falls: Secondary | ICD-10-CM | POA: Diagnosis not present

## 2022-10-16 DIAGNOSIS — G4733 Obstructive sleep apnea (adult) (pediatric): Secondary | ICD-10-CM | POA: Diagnosis not present

## 2022-10-19 ENCOUNTER — Telehealth: Payer: Self-pay

## 2022-10-19 NOTE — Transitions of Care (Post Inpatient/ED Visit) (Signed)
10/19/2022  Name: Caitlyn Aguirre MRN: [id number] DOB: September 23, 1935  Today's TOC FU Call Status: Today's TOC FU Call Status:: Unsuccessful Call (1st Attempt) Unsuccessful Call (1st Attempt) Date: 10/19/22  Attempted to reach the patient regarding the most recent Inpatient/ED visit.  Follow Up Plan: Additional outreach attempts will be made to reach the patient to complete the Transitions of Care (Post Inpatient/ED visit) call.   Jodelle Gross RN, BSN, CCM [facility] RN Care Coordinator/ Transitions of Care Direct Dial: [phone]  Fax: [phone]

## 2022-10-20 ENCOUNTER — Ambulatory Visit (HOSPITAL_COMMUNITY): Payer: 59 | Attending: Internal Medicine

## 2022-10-20 ENCOUNTER — Telehealth: Payer: Self-pay

## 2022-10-20 DIAGNOSIS — R0609 Other forms of dyspnea: Secondary | ICD-10-CM

## 2022-10-20 LAB — ECHOCARDIOGRAM COMPLETE
Area-P 1/2: 5.46 cm2
S' Lateral: 4.4 cm

## 2022-10-20 NOTE — Transitions of Care (Post Inpatient/ED Visit) (Signed)
10/20/2022  Name: Caitlyn Aguirre MRN: [id number] DOB: 06/14/1935  Today's TOC FU Call Status: Today's TOC FU Call Status:: Successful TOC FU Call Completed TOC FU Call Complete Date: 10/20/22 Patient's Name and Date of Birth confirmed.  Transition Care Management Follow-up Telephone Call Date of Discharge: 10/15/22 Discharge Facility: [facility]) Type of Discharge: Inpatient Admission Primary Inpatient Discharge Diagnosis:: Acute on Chronic Combined Systolic and Diastolic Congestive Heart Failure How have you been since you were released from the hospital?: Better Any questions or concerns?: No  Items Reviewed: Did you receive and understand the discharge instructions provided?: Yes Medications obtained,verified, and reconciled?: Yes (Medications Reviewed) Any new allergies since your discharge?: No Dietary orders reviewed?: No Do you have support at home?: Yes People in Home: child(ren), adult Name of Support/Comfort Primary Source: Elease Hashimoto  Medications Reviewed Today: Medications Reviewed Today     Reviewed by Jodelle Gross, RN (Case Manager) on 10/20/22 at 1240  Med List Status: <None>   Medication Order Taking? Sig Documenting Provider Last Dose Status Informant  Accu-Chek Softclix Lancets lancets [id number] Yes Check 1 time a day as instructed [name], MD Taking Active Self, Child, Pharmacy Records  albuterol (VENTOLIN HFA) 108 (90 Base) MCG/ACT inhaler [id number] Yes Inhale 1-2 puffs into the lungs every 6 (six) hours as needed for wheezing or shortness of breath. [name], MD Taking Active Self, Child, Pharmacy Records  atorvastatin (LIPITOR) 40 MG tablet [id number] Yes Take 1 tablet (40 mg total) by mouth daily. [name], MD Taking Active Self, Child, Pharmacy Records  Blood Glucose Monitoring Suppl [facility] GUIDE ME) w/Device Andria Rhein [id number] Yes Check 1 times a day as instructed [name], MD Taking Active Self,  Child, Pharmacy Records           Med Note Alben Deeds, Feliberto Gottron Sep 04, 2019 11:14 AM) Checks her blood sugar prn  empagliflozin (JARDIANCE) 10 MG TABS tablet [id number] Yes Take 1 tablet (10 mg total) by mouth daily. Meryl Dare, MD Taking Active   furosemide (LASIX) 40 MG tablet [id number] Yes Take 1 tablet (40 mg total) by mouth daily as needed (for weight gain greater than 3 lbs in a day or 5 lbs in a week). Meryl Dare, MD Taking Active   levothyroxine (SYNTHROID) 75 MCG tablet [id number] Yes Take 1 tablet (75 mcg total) by mouth daily. [name], MD Taking Active Self, Child, Pharmacy Records           Med Note Helaine Chess, [place] R   Fri Oct 09, 2022  1:17 PM) Suspect patient is taking 150 mcg by accident because she has 2 separate bottles of levothyroxine 75 mcg and is taking one pill from every bottle.   metFORMIN (GLUCOPHAGE) 1000 MG tablet [id number] Yes Take 1 tablet (1,000 mg total) by mouth daily with breakfast. [name], MD Taking Active Self, Child, Pharmacy Records  omeprazole (PRILOSEC) 20 MG capsule [id number] Yes Take 1 capsule (20 mg total) by mouth daily. [name], MD Taking Active Self, Child, Pharmacy Records  OXYGEN [id number] Yes Inhale 2 L into the lungs daily as needed (for breathing).  [provider] Taking Active Self, Child, Pharmacy Records           Med Note ([place], [place]   Thu Apr 25, 2020  8:15 PM)    PARoxetine (PAXIL) 30 MG tablet [id number] No Take 1 tablet (30 mg total) by mouth daily.  Patient not taking: Reported  on 10/20/2022   [name], MD Not Taking Active Self, Child, Pharmacy Records           Med Note Arma Heading   Fri Oct 09, 2022  1:18 PM) Stopped taking this per her PCP when she started taking the mirtazapine  sacubitril-valsartan (ENTRESTO) 49-51 MG [id number] Yes Take 1 tablet by mouth 2 (two) times daily. Meryl Dare, MD Taking Active   tiotropium [facility]  HANDIHALER) 18 MCG inhalation capsule [id number] Yes Place 1 capsule (18 mcg total) into inhaler and inhale daily. INHALE THE CONTENTS OF 1  CAPSULE BY MOUTH VIA  HANDIHALER DAILY Strength: 18 mcg [name], MD Taking Active Self, Child, Pharmacy Records            Home Care and Equipment/Supplies: Were Home Health Services Ordered?: Yes [facility] of [facility] Agency[facility] Has Agency set up a time to come to your home?: Yes First Home Health Visit Date: 10/19/22 Any new equipment or medical supplies ordered?: No  Functional Questionnaire: Do you need assistance with bathing/showering or dressing?: Yes Do you need assistance with meal preparation?: Yes Do you need assistance with eating?: No Do you have difficulty maintaining continence: No Do you need assistance with getting out of bed/getting out of a chair/moving?: No Do you have difficulty managing or taking your medications?: Yes  Follow up appointments reviewed: PCP Follow-up appointment confirmed?: Yes Date of PCP follow-up appointment?: 11/02/22 Follow-up Provider: Dr. Oswaldo Done [facility] Follow-up appointment confirmed?: Yes Date of Specialist follow-up appointment?: 10/27/22 Follow-Up Specialty Provider:: [facility] Do you need transportation to your follow-up appointment?: No Do you understand care options if your condition(s) worsen?: Yes-patient verbalized understanding  SDOH Interventions Today    Flowsheet Row Most Recent Value  SDOH Interventions   Food Insecurity Interventions Intervention Not Indicated  Housing Interventions Intervention Not Indicated  Transportation Interventions Intervention Not Indicated  Utilities Interventions Intervention Not Indicated      Jodelle Gross RN, BSN, CCM [facility] RN Care Coordinator/ Transitions of Care Direct Dial: [phone]  Fax: [phone]

## 2022-10-21 NOTE — Progress Notes (Signed)
Patient was in hospital for same diagnosis of heart failure. She had an echo in Sept for this. Wil not be caling with results

## 2022-10-21 NOTE — Progress Notes (Signed)
Cardiology Office Note:    Date:  11/03/2022   ID:  Caitlyn Aguirre, DOB 1935/10/18, MRN [id number]  PCP:  [name], MD  Cardiologist:  None  Electrophysiologist:  None   Referring MD: [name]   Chief Complaint: hospital follow-up of CHF  History of Present Illness:    Caitlyn Aguirre is a 87 y.o. female with a history of  normal coronaries on remote cardiac catheterization in 1999 and 2010, chronic HFrEF with EF 30-35% on Echo in 10/2022, COPD and fibrotic lung disease with chronic hypoxic respiratory failure on 2 L of O2 at home, CVA, hypertension, hyperlipidemia, type 2 diabetes mellitus, hypothyroidism, chronic anemia, and PMR  who is followed by Dr. Servando Salina and presents today for follow-up of CHF.   She has a history of a MI in 1999; however, cardiac catheterization at that time showed normal coronaries.  EF at that time was 35-40% but has subsequently normalized.  He reportedly had another cardiac catheterization in 2010 which showed no significant CAD.  Last ischemic evaluation was a Myoview in 10/2014 which was low risk and showed no reversible ischemia.  Patient has not been seen by cardiology since 2016.  Last Echo in 05/2021 showed LVEF of 55-60% with abnormal septal motion but no regional wall motion abnormalities, mild LVH, and grade 1 diastolic function.  She also has COPD and is on 2 L of O2 at home. Recent High-resolution CT in 08/2022 showed pulmonary parenchymal pattern of fibrosis and she was referred to Pulmonology.  She was seen by Dr. Marchelle Gearing earlier this week on 10/06/2018 and spirometry and DLCO, Echo, and blood work were ordered.  She was also referred to back Cardiology for possible right heart catheterization.   However, she was then admitted from 10/08/2022 to 10/15/2022 for acute on chronic CHF after presenting with worsening shortness of breath initially requiring BiPAP.  High-sensitivity troponin was negative. BNP was elevated in the 500s. Chest  x-ray showed emphysema and chronic fibrotic lung disease but no acute findings. Echo showed LVEF of 30-35% with global hypokinesis and grade 1 diastolic dysfunction. She was diuresed with IV Lasix and GDMT was adjusted. Initial plan was for [facility]; however, given renal function only a right cardiac catheterization was performed and showed normal left heart filling pressures, mildly elevated right heart filling pressures, moderate pulmonary hypertension, normal Fick cardiac output, and moderately reduced thermodilution cardiac output. She was felt to be a poor candidate for additional invasive or interventional procedures. Continue medical therapy was recommended.   Echo on 10/20/2022 (the one ordered by Pulmonology) showed LVEF of 25-30% with global hypokinesis and grade 2 diastolic dysfunction, moderately enlarged RV with moderately reduced RV function, mild MR,  and mild to moderate TR.  She was [facility] by [facility] on 10/27/2022 at which time she reported chronic dyspnea on exertion and dizziness with standing and long periods of ambulation but no syncope. She was noted to be in sinus tachycardia with rate of 111 bpm. This was felt to likely be due to over-diuresis but urinalysis was ordered to rule out infection which was negative. Jardiance was stopped and she was advised to only take Lasix as needed.   Patient presents today for hospital follow-up. She is here with her daughter. Overall, patient reports she is doing better since recent admission. She has chronic shortness of breath due to her underlying lung disease but this is stable. She is on 2L of O2 at night and as  needed during the day. She has chronic and stable 3 pillow orthopnea but no PND or edema. Her weight is up 7lbs from recent visit at the CHF clinic on 10/27/2022 but is stable on her scales at home. She has not needed any of her PRN Lasix. She denies any chest pain. She is mildly tachycardic today with heart rate of 103 and reports  she can feel her heart beating faster but denies any other significant palpitations. Of note, her heart rate was in the 50s at visit with PCP yesterday. No lightheadedness, dizziness, or syncope. She continues to have generalized weakness. She also reports difficulty sleeping but this is not new - she states she has had this for years. She does report a recent episode of right sided neck pain that she described as stiffness and pain when moving her neck which sounds musculoskeletal in nature. This has since resolved.  EKGs/Labs/Other Studies Reviewed:    The following studies were reviewed:  Myoview 10/31/2014: Nuclear stress EF: 55%. There was no ST segment deviation noted during stress. No T wave inversion was noted during stress. Defect 1: There is a medium defect of mild severity present in the mid anterolateral and apical lateral location. Findings consistent with prior myocardial infarction. This is a low risk study.   Low risk stress nuclear study with a medium sized area of mild anterolateral scar, no reversible ischemia and normal left ventricular regional and global systolic function. _______________  Echocardiogram 05/31/2021: Impressions: 1. Abnormal septal motion . Left ventricular ejection fraction, by  estimation, is 55 to 60%. The left ventricle has normal function. The left  ventricle has no regional wall motion abnormalities. There is mild left  ventricular hypertrophy. Left  ventricular diastolic parameters are consistent with Grade I diastolic  dysfunction (impaired relaxation).   2. Right ventricular systolic function is normal. The right ventricular  size is normal. There is normal pulmonary artery systolic pressure.   3. Left atrial size was mildly dilated.   4. The mitral valve is abnormal. Trivial mitral valve regurgitation. No  evidence of mitral stenosis.   5. The aortic valve is tricuspid. There is mild calcification of the  aortic valve. There is mild  thickening of the aortic valve. Aortic valve  regurgitation is not visualized. Aortic valve sclerosis is present, with  no evidence of aortic valve stenosis.   6. Pulmonic valve regurgitation is moderate.   7. The inferior vena cava is normal in size with greater than 50%  respiratory variability, suggesting right atrial pressure of 3 mmHg.   8. Cannot exclude a small PFO.  _______________  Echocardiogram 10/09/2022: Impressions: 1. Left ventricular ejection fraction, by estimation, is 30 to 35%. The  left ventricle has moderately decreased function. The left ventricle  demonstrates global hypokinesis. Left ventricular diastolic parameters are  consistent with Grade I diastolic  dysfunction (impaired relaxation). There is the interventricular septum is  flattened in systole and diastole, consistent with right ventricular  pressure and volume overload.   2. Right ventricular systolic function is mildly reduced. The right  ventricular size is normal. There is moderately elevated pulmonary artery  systolic pressure. The estimated right ventricular systolic pressure is  47.4 mmHg.   3. The mitral valve is abnormal. Trivial mitral valve regurgitation.   4. The tricuspid valve is abnormal. Tricuspid valve regurgitation is mild  to moderate.   5. The aortic valve is tricuspid. Aortic valve regurgitation is not  visualized.   6. The pulmonic valve was abnormal.  7. The inferior vena cava is normal in size with <50% respiratory  variability, suggesting right atrial pressure of 8 mmHg.  _______________  Right Cardiac Catheterization 10/12/2022: Conclusions: Normal left heart filling pressure. Mildly elevated right heart filling pressure. Moderate pulmonary hypertension. Normal Fick cardiac output. Moderately reduced thermodilution cardiac output.   Recommendations: Continue to optimize goal-directed medical therapy for HFrEF. _______________  Echocardiogram 10/20/2022: Impressions: 1.  Left ventricular ejection fraction, by estimation, is 25 to 30%. The  left ventricle has severely decreased function. The left ventricle  demonstrates global hypokinesis. Left ventricular diastolic parameters are  consistent with Grade II diastolic  dysfunction (pseudonormalization). Elevated left ventricular end-diastolic  pressure.   2. Right ventricular systolic function is moderately reduced. The right  ventricular size is moderately enlarged.   3. The mitral valve is normal in structure. Mild mitral valve  regurgitation. No evidence of mitral stenosis.   4. The tricuspid valve is abnormal. Tricuspid valve regurgitation is mild  to moderate.   5. The aortic valve is normal in structure. Aortic valve regurgitation is  mild. Aortic valve sclerosis is present, with no evidence of aortic valve  stenosis.   6. The pulmonic valve was abnormal. Pulmonic valve regurgitation is  moderate to severe.   7. The inferior vena cava is normal in size with greater than 50%  respiratory variability, suggesting right atrial pressure of 3 mmHg.     EKG:  EKG ordered today.   EKG Interpretation Date/Time:  Tuesday November 03 2022 14:28:48 EDT Ventricular Rate:  103 PR Interval:  178 QRS Duration:  78 QT Interval:  338 QTC Calculation: 442 R Axis:   -7  Text Interpretation: Sinus tachycardia with multiple PVCs Nonspecific T wave abnormality Confirmed by Marjie Skiff [phone]) on 11/03/2022 2:39:56 PM    Recent Labs: 10/06/2022: Pro B Natriuretic peptide (BNP) 388.0 10/08/2022: ALT 17 10/09/2022: TSH 1.491 10/15/2022: Magnesium 1.6 10/27/2022: B Natriuretic Peptide 72.3; Hemoglobin 12.1; Platelets 272 11/02/2022: BUN 21; Creatinine, Ser 1.58; Potassium 4.7; Sodium 139  Recent Lipid Panel    Component Value Date/Time   CHOL 140 07/27/2022 0949   TRIG 81 07/27/2022 0949   HDL 59 07/27/2022 0949   CHOLHDL 2.4 07/27/2022 0949   CHOLHDL 3.2 05/31/2021 0438   VLDL 14 05/31/2021 0438   LDLCALC 65  07/27/2022 0949    Physical Exam:    Vital Signs: BP (!) 100/48   Pulse (!) 103   Ht 5\' 6"  (1.676 m)   Wt 178 lb (80.7 kg)   SpO2 94%   BMI 28.73 kg/m     Wt Readings from Last 3 Encounters:  11/03/22 178 lb (80.7 kg)  11/02/22 176 lb 6.4 oz (80 kg)  10/27/22 171 lb 12.8 oz (77.9 kg)     General: 87 y.o. African-American female in no acute distress. HEENT: Normocephalic and atraumatic. Sclera clear. EOMs intact. Neck: Supple. No carotid bruits. No JVD. Heart: Mildly tachycardic with irregular rhythm.  No murmurs, gallops, or rubs.  Lungs: No increased work of breathing. Course crackles in bilateral bases consistent with pulmonary fibrosis. Extremities: No lower extremity edema.    Skin: Warm and dry. Neuro: No focal deficits. Psych: Normal affect. Responds appropriately.   Assessment:    1. Chronic HFrEF (heart failure with reduced ejection fraction) (HCC)   2. Tachycardia   3. Pulmonary hypertension, unspecified (HCC)   4. Primary hypertension   5. Hyperlipidemia, unspecified hyperlipidemia type   6. Type 2 diabetes mellitus with complication, without long-term current use  of insulin (HCC)   7. Stage 3 chronic kidney disease, unspecified whether stage 3a or 3b CKD (HCC)   8. Chronic obstructive pulmonary disease, unspecified COPD type (HCC)     Plan:    Chronic HFrEF Patient has a long history of chronic systolic CHF dating back to at 66 when EF was 35-40%. LHC in 1999 and 2010 showed normal coronaries. EF later normalized. Echo in 05/2021 showed LVEF of 55-60%. She was recently admitted for acute CHF in 10/2022 after presenting with progressive shortness of breath. Echo on 10/09/2022 during admission showed LVEF of 30-35% with global hypokinesis and grade 1 diastolic dysfunction. She was diuresed and GDMT was optimized. Initial plan was for [facility]; however, given AKI only a right cardiac catheterization was performed and showed normal left heart filling pressures, mildly  elevated right heart filling pressures, moderate pulmonary hypertension, normal Fick cardiac output, and moderately reduced thermodilution cardiac output. She was felt to be a poor candidate for additional invasive or interventional procedures. Outpatient Echo on 10/20/2022 showed LVEF of 25-30% with global hypokinesis and grade 2 diastolic dysfunction, moderately enlarged RV with moderately reduced RV function, mild MR,  and mild to moderate TR. - Euvolemic on exam.  - Continue Lasix 40mg  as needed for weight gain and edema. - Continue Entresto 49-51mg  twice daily.  - She was started on low dose Bisoprolol during admission but this was stopped due to increased lightheadedness/ dizziness. Heart rates have also be low at times. Will hold off on restarting beta-blocker given soft BP. - Previously on Jardiance but this was recently stopped due to concerns for over-diuresis and dizziness with standing.  - Discussed the importance of daily weight and sodium/ fluid restrictions.   Sinus Tachycardia Frequent PVCs EKG today shows sinus tachycardia, rate 103 bpm with multiple PVCs. She was also in mild sinus tachycardia at visit with the CHF Clinic on 10/27/2022 but heart rate was 54 bpm at PCP's office yesterday.  - She is minimally symptomatic with this and states she can feel her heart racing a little but denies any lightheadedness/ dizziness.  - Will check Magnesium level today. Potassium was 4.7 on labs yesterday and TSH was normal last month.  - Sinus tachycardia was previously felt to possible be due to over-diuresis which is why Jardiance was stopped and Lasix was switched to as needed. Creatinine has since improved. No obvious source of infection. Hemoglobin was stable at 12.1 on 9/24. Will ask patient to keep a log of BP/HR for me for 1 week and then send this to me. Can adjust medications as needed at that time. May also need to order a heart monitor to assess for PVC burden and average heart rate.    Pulmonary Hypertension WHO Group 2 and 3.  - Continue management of CHF as above.  - Otherwise, management per Pulmonology.  Hypertension BP soft but stable. - Continue  Entresto as above.   Hyperlipidemia Lipid panel in 07/2022: Total Cholesterol 140, Triglycerides 81, HDL 59, LDL 65.  - Continue Lipitor 40mg  daily.  Type 2 Diabetes Mellitus Hemoglobin A1c 6.6 in 07/2022. - On Metformin and Jardiance.   CKD Stage III Baseline creatinine around 1.2 to 1.4. Stable at 1.58 on labs at [facility] office yesterday.  COPD on Home O2 Idiopathic Pulmonary Fibrosis Patient has a history of COPD on home O2 and was also recently diagnosed with idiopathic pulmonary fibrosis. Recent RHC showed moderate pulmonary hypertension with PCWP of 10 mmHg.  - Stable. On 2L of  O2 at night and as needed during the day. - Management per Pulmonology.  Disposition: Follow up in 2 months.   Medication Adjustments/Labs and Tests Ordered: Current medicines are reviewed at length with the patient today.  Concerns regarding medicines are outlined above.  Orders Placed This Encounter  Procedures   Magnesium   EKG 12-Lead   No orders of the defined types were placed in this encounter.   Patient Instructions  Medication Instructions:  NO CHANGES *If you need a refill on your cardiac medications before your next appointment, please call your pharmacy*   Lab Work: MAGNESIUM TODAY If you have labs (blood work) drawn today and your tests are completely normal, you will receive your results only by: MyChart Message (if you have MyChart) OR A paper copy in the mail If you have any lab test that is abnormal or we need to change your treatment, we will call you to review the results.   Testing/Procedures: NO TESTING   Follow-Up: At [facility], you and your health needs are our priority.  As part of our continuing mission to provide you with exceptional heart care, we have created designated  Provider Care Teams.  These Care Teams include your primary Cardiologist (physician) and Advanced Practice Providers (APPs -  Physician Assistants and Nurse Practitioners) who all work together to provide you with the care you need, when you need it.   Your next appointment:   2 month(s)  Provider:   Thomasene Ripple, DO or Marjie Skiff, PA-C  Other Instructions KEEP LOG OF BLOOD PRESSURE AND HEART RATE FOR 1 WEEK SEND RECORD VIA MYCHART   Heart Failure Education: Weigh yourself EVERY morning after you go to the bathroom but before you eat or drink anything. Write this number down in a weight log/diary. If you gain 3 pounds overnight or 5 pounds in a week, you can take a dose of your as needed Lasix. If weight does not improve with the Lasix, please call the office. Take your medicines as prescribed. If you have concerns about your medications, please call us before you stop taking them.  Eat low salt foods--Limit salt (sodium) to 2000 mg per day. This will help prevent your body from holding onto fluid. Read food labels as many processed foods have a lot of sodium, especially canned goods and prepackaged meats. If you would like some assistance choosing low sodium foods, we would be happy to set you up with a nutritionist. Limit all fluids for the day to less than 2 liters (64 ounces). Fluid includes all drinks, coffee, juice, ice chips, soup, jello, and all other liquids. Stay as active as you can everyday. Staying active will give you more energy and make your muscles stronger. Start with 5 minutes at a time and work your way up to 30 minutes a day. Break up your activities--do some in the morning and some in the afternoon. Start with 3 days per week and work your way up to 5 days as you can.  If you have chest pain, feel short of breath, dizzy, or lightheaded, STOP. If you don't feel better after a short rest, call 911. If you do feel better, call the office to let us know you have symptoms with  exercise.    Signed, Corrin Parker, PA-C  11/03/2022 6:23 PM    [facility]

## 2022-10-27 ENCOUNTER — Ambulatory Visit (HOSPITAL_COMMUNITY)
Admit: 2022-10-27 | Discharge: 2022-10-27 | Disposition: A | Discharge status: Home / Self Care | Payer: 59 | Attending: Cardiology | Admitting: Cardiology

## 2022-10-27 VITALS — Wt 171.8 lb

## 2022-10-27 DIAGNOSIS — I5043 Acute on chronic combined systolic (congestive) and diastolic (congestive) heart failure: Secondary | ICD-10-CM | POA: Diagnosis not present

## 2022-10-27 DIAGNOSIS — I493 Ventricular premature depolarization: Secondary | ICD-10-CM | POA: Diagnosis not present

## 2022-10-27 DIAGNOSIS — R000 Tachycardia, unspecified: Secondary | ICD-10-CM | POA: Diagnosis not present

## 2022-10-27 DIAGNOSIS — R300 Dysuria: Secondary | ICD-10-CM | POA: Diagnosis not present

## 2022-10-27 DIAGNOSIS — R350 Frequency of micturition: Secondary | ICD-10-CM | POA: Insufficient documentation

## 2022-10-27 LAB — CBC
HCT: 39.3 % (ref 36.0–46.0)
Hemoglobin: 12.1 g/dL (ref 12.0–15.0)
MCH: 25.3 pg — ABNORMAL LOW (ref 26.0–34.0)
MCHC: 30.8 g/dL (ref 30.0–36.0)
MCV: 82 fL (ref 80.0–100.0)
Platelets: 272 10*3/uL (ref 150–400)
RBC: 4.79 MIL/uL (ref 3.87–5.11)
RDW: 25.7 % — ABNORMAL HIGH (ref 11.5–15.5)
WBC: 7.9 10*3/uL (ref 4.0–10.5)
nRBC: 0 % (ref 0.0–0.2)

## 2022-10-27 LAB — URINALYSIS, ROUTINE W REFLEX MICROSCOPIC
Bilirubin Urine: NEGATIVE
Glucose, UA: >=500 mg/dL — AB
Hgb urine dipstick: NEGATIVE
Ketones, ur: NEGATIVE mg/dL
Leukocytes,Ua: NEGATIVE
Nitrite: NEGATIVE
Protein, ur: NEGATIVE mg/dL
Specific Gravity, Urine: 1.014 (ref 1.005–1.030)
pH: 5 (ref 5.0–8.0)

## 2022-10-27 LAB — BASIC METABOLIC PANEL
Anion gap: 9 (ref 5–15)
BUN: 28 mg/dL — ABNORMAL HIGH (ref 8–23)
CO2: 19 mmol/L — ABNORMAL LOW (ref 22–32)
Calcium: 10.1 mg/dL (ref 8.9–10.3)
Chloride: 108 mmol/L (ref 98–111)
Creatinine, Ser: 1.76 mg/dL — ABNORMAL HIGH (ref 0.44–1.00)
GFR, Estimated: 28 mL/min — ABNORMAL LOW (ref >60)
Glucose, Bld: 118 mg/dL — ABNORMAL HIGH (ref 70–99)
Potassium: 4.3 mmol/L (ref 3.5–5.1)
Sodium: 136 mmol/L (ref 135–145)

## 2022-10-27 LAB — PROCALCITONIN: Procalcitonin: <0.1 ng/mL

## 2022-10-27 LAB — BRAIN NATRIURETIC PEPTIDE: B Natriuretic Peptide: 72.3 pg/mL (ref 0.0–100.0)

## 2022-10-27 NOTE — Patient Instructions (Signed)
Medication Changes:  None, continue current medications  Lab Work:  Labs done today, your results will be available in MyChart, we will contact you for abnormal readings.   Special Instructions // Education:  Do the following things EVERYDAY: Weigh yourself in the morning before breakfast. Write it down and keep it in a log. Take your medicines as prescribed Eat low salt foods--Limit salt (sodium) to 2000 mg per day.  Stay as active as you can everyday Limit all fluids for the day to less than 2 liters   Follow-Up in: Thank you for allowing [place] to provider your heart failure care after your recent hospitalization. Please follow-up with [facility] as scheduled
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
## 2022-10-29 ENCOUNTER — Telehealth (HOSPITAL_COMMUNITY): Payer: Self-pay | Admitting: Cardiology

## 2022-10-29 NOTE — Telephone Encounter (Signed)
Patient called.  Patient aware.  

## 2022-10-29 NOTE — Telephone Encounter (Signed)
-----   Message from [place] sent at 10/27/2022  5:06 PM EDT ----- Labs suggest dehydration. Instruct to stop Jardiance. UA not c/w UTI.

## 2022-11-02 ENCOUNTER — Encounter: Payer: Self-pay | Admitting: Student in an Organized Health Care Education/Training Program

## 2022-11-02 ENCOUNTER — Ambulatory Visit: Payer: 59 | Admitting: Student in an Organized Health Care Education/Training Program

## 2022-11-02 VITALS — BP 106/57 | HR 54 | Temp 97.8°F | Ht 68.0 in | Wt 176.4 lb

## 2022-11-02 DIAGNOSIS — I5022 Chronic systolic (congestive) heart failure: Secondary | ICD-10-CM | POA: Diagnosis not present

## 2022-11-02 DIAGNOSIS — Z7984 Long term (current) use of oral hypoglycemic drugs: Secondary | ICD-10-CM | POA: Diagnosis not present

## 2022-11-02 DIAGNOSIS — E1169 Type 2 diabetes mellitus with other specified complication: Secondary | ICD-10-CM

## 2022-11-02 DIAGNOSIS — J849 Interstitial pulmonary disease, unspecified: Secondary | ICD-10-CM

## 2022-11-02 LAB — BASIC METABOLIC PANEL
Anion gap: 10 (ref 5–15)
BUN: 21 mg/dL (ref 8–23)
CO2: 20 mmol/L — ABNORMAL LOW (ref 22–32)
Calcium: 10.5 mg/dL — ABNORMAL HIGH (ref 8.9–10.3)
Chloride: 109 mmol/L (ref 98–111)
Creatinine, Ser: 1.58 mg/dL — ABNORMAL HIGH (ref 0.44–1.00)
GFR, Estimated: 32 mL/min — ABNORMAL LOW (ref >60)
Glucose, Bld: 128 mg/dL — ABNORMAL HIGH (ref 70–99)
Potassium: 4.7 mmol/L (ref 3.5–5.1)
Sodium: 139 mmol/L (ref 135–145)

## 2022-11-02 LAB — POCT GLYCOSYLATED HEMOGLOBIN (HGB A1C): Hemoglobin A1C: 6.3 % — AB (ref 4.0–5.6)

## 2022-11-02 LAB — GLUCOSE, CAPILLARY: Glucose-Capillary: 106 mg/dL — ABNORMAL HIGH (ref 70–99)

## 2022-11-02 MED ORDER — EMPAGLIFLOZIN 10 MG PO TABS: 10.0000 mg | ORAL_TABLET | Freq: Every day | ORAL | 3 refills | Status: DC

## 2022-11-02 MED ORDER — SACUBITRIL-VALSARTAN 49-51 MG PO TABS: 1.0000 | ORAL_TABLET | Freq: Two times a day (BID) | ORAL | 3 refills | Status: DC

## 2022-11-02 NOTE — Addendum Note (Signed)
Addended by: Bufford Spikes on: 11/02/2022 10:24 AM   Modules accepted: Orders

## 2022-11-02 NOTE — Progress Notes (Signed)
Established Patient Office Visit  Subjective   Patient ID: Caitlyn Aguirre, female    DOB: [birth year]  Age: 87 y.o. MRN: [id number]  Chief Complaint  Patient presents with   Medication Refill   Diabetes   Hospitalization Follow-up    HPI  87 year old person here for management of diabetes and heart failure.  She had a hospitalization earlier this month where she had a new diagnosis of heart failure with reduced ejection fraction.  She had a right heart catheterization that showed moderate pulmonary hypertension.  She was discharged with some new medications including Entresto and Jardiance.  She and her daughter report that adherence with medications.  No side effects.  Report doing well at home.  She has shortness of breath with ambulating around the house.  Unable to walk up 7 steps on her front porch without resting.  Pretty limited in any exertion outside the house.  Does not like to use supplemental oxygen except for at night.  No cough or productive sputum.  No fevers.  No chest discomfort.    Objective:     BP (!) 106/57 (BP Location: Left Arm, Patient Position: Sitting, Cuff Size: Small)   Pulse (!) 54   Temp 97.8 F (36.6 C) (Oral)   Ht 5\' 8"  (1.727 m)   Wt 176 lb 6.4 oz (80 kg)   SpO2 100%   BMI 26.82 kg/m    Physical Exam  Gen: Well-appearing woman sitting in a wheelchair CV: Regular rate and rhythm Lungs: Unlabored, fine crackles heard throughout with no wheezing Ext: Warm well-perfused with no lower extremity edema Psych: Appropriate mood and affect, not depressed or anxious appearing     Assessment & Plan:   Problem List Items Addressed This Visit       High   ILD (interstitial lung disease) (HCC) (Chronic)    Managed by Dr. Marchelle Gearing with pulmonology.  Has a visits with them next week to discuss antifibrotic medication options.  Talk to her about the need for supplemental oxygen especially with exertion and encouraged more consistent use.      Type 2  diabetes mellitus with other specified complication (HCC) - Primary (Chronic)   Relevant Orders   POC Hbg A1C (Completed)   BMP8+Anion Gap   Chronic HFrEF (heart failure with reduced ejection fraction) (HCC)    Recent diagnosis and hospitalization for acute on chronic heart failure with reduced EF.  She also had right heart dysfunction due to pulmonary hypertension.  Appears well compensated today, doing well after discharge.  Will continue medication management with Entresto 100 mg twice daily, Jardiance 10 mg daily, and Lasix 40 mg daily as needed.  She has a visit with cardiology clinic tomorrow.  Will check BMP today given recent initiation of Entresto.  Estimated GFR was 28 last week, probably a little too low to initiate spironolactone, especially given some borderline hyperkalemia.  I would like to see her do cardiac rehabilitation at some point, as she still has class III symptoms and pretty low exertional status, pretty low quality of life.        [name], MD

## 2022-11-02 NOTE — Assessment & Plan Note (Signed)
Managed by Dr. Marchelle Gearing with pulmonology.  Has a visits with them next week to discuss antifibrotic medication options.  Talk to her about the need for supplemental oxygen especially with exertion and encouraged more consistent use.

## 2022-11-02 NOTE — Assessment & Plan Note (Signed)
Recent diagnosis and hospitalization for acute on chronic heart failure with reduced EF.  She also had right heart dysfunction due to pulmonary hypertension.  Appears well compensated today, doing well after discharge.  Will continue medication management with Entresto 100 mg twice daily, Jardiance 10 mg daily, and Lasix 40 mg daily as needed.  She has a visit with cardiology clinic tomorrow.  Will check BMP today given recent initiation of Entresto.  Estimated GFR was 28 last week, probably a little too low to initiate spironolactone, especially given some borderline hyperkalemia.  I would like to see her do cardiac rehabilitation at some point, as she still has class III symptoms and pretty low exertional status, pretty low quality of life.

## 2022-11-03 ENCOUNTER — Encounter: Payer: Self-pay | Admitting: Student

## 2022-11-03 ENCOUNTER — Ambulatory Visit: Payer: 59 | Attending: Student | Admitting: Student

## 2022-11-03 VITALS — BP 100/48 | HR 103 | Ht 66.0 in | Wt 178.0 lb

## 2022-11-03 DIAGNOSIS — R000 Tachycardia, unspecified: Secondary | ICD-10-CM | POA: Diagnosis not present

## 2022-11-03 DIAGNOSIS — I5022 Chronic systolic (congestive) heart failure: Secondary | ICD-10-CM | POA: Diagnosis not present

## 2022-11-03 DIAGNOSIS — J449 Chronic obstructive pulmonary disease, unspecified: Secondary | ICD-10-CM

## 2022-11-03 DIAGNOSIS — N183 Chronic kidney disease, stage 3 unspecified: Secondary | ICD-10-CM | POA: Diagnosis not present

## 2022-11-03 DIAGNOSIS — I10 Essential (primary) hypertension: Secondary | ICD-10-CM

## 2022-11-03 DIAGNOSIS — E118 Type 2 diabetes mellitus with unspecified complications: Secondary | ICD-10-CM | POA: Diagnosis not present

## 2022-11-03 DIAGNOSIS — I272 Pulmonary hypertension, unspecified: Secondary | ICD-10-CM

## 2022-11-03 DIAGNOSIS — E785 Hyperlipidemia, unspecified: Secondary | ICD-10-CM | POA: Diagnosis not present

## 2022-11-03 NOTE — Patient Instructions (Signed)
Medication Instructions:  NO CHANGES *If you need a refill on your cardiac medications before your next appointment, please call your pharmacy*   Lab Work: MAGNESIUM TODAY If you have labs (blood work) drawn today and your tests are completely normal, you will receive your results only by: MyChart Message (if you have MyChart) OR A paper copy in the mail If you have any lab test that is abnormal or we need to change your treatment, we will call you to review the results.   Testing/Procedures: NO TESTING   Follow-Up: At [facility], you and your health needs are our priority.  As part of our continuing mission to provide you with exceptional heart care, we have created designated Provider Care Teams.  These Care Teams include your primary Cardiologist (physician) and Advanced Practice Providers (APPs -  Physician Assistants and Nurse Practitioners) who all work together to provide you with the care you need, when you need it.   Your next appointment:   2 month(s)  Provider:   Thomasene Ripple, DO or Marjie Skiff, PA-C  Other Instructions KEEP LOG OF BLOOD PRESSURE AND HEART RATE FOR 1 WEEK SEND RECORD VIA MYCHART   Heart Failure Education: Weigh yourself EVERY morning after you go to the bathroom but before you eat or drink anything. Write this number down in a weight log/diary. If you gain 3 pounds overnight or 5 pounds in a week, you can take a dose of your as needed Lasix. If weight does not improve with the Lasix, please call the office. Take your medicines as prescribed. If you have concerns about your medications, please call us before you stop taking them.  Eat low salt foods--Limit salt (sodium) to 2000 mg per day. This will help prevent your body from holding onto fluid. Read food labels as many processed foods have a lot of sodium, especially canned goods and prepackaged meats. If you would like some assistance choosing low sodium foods, we would be happy to set you  up with a nutritionist. Limit all fluids for the day to less than 2 liters (64 ounces). Fluid includes all drinks, coffee, juice, ice chips, soup, jello, and all other liquids. Stay as active as you can everyday. Staying active will give you more energy and make your muscles stronger. Start with 5 minutes at a time and work your way up to 30 minutes a day. Break up your activities--do some in the morning and some in the afternoon. Start with 3 days per week and work your way up to 5 days as you can.  If you have chest pain, feel short of breath, dizzy, or lightheaded, STOP. If you don't feel better after a short rest, call 911. If you do feel better, call the office to let us know you have symptoms with exercise.

## 2022-11-04 LAB — MAGNESIUM: Magnesium: 1.6 mg/dL (ref 1.6–2.3)

## 2022-11-05 ENCOUNTER — Ambulatory Visit: Payer: 59 | Attending: Student

## 2022-11-05 ENCOUNTER — Other Ambulatory Visit: Payer: Self-pay

## 2022-11-05 DIAGNOSIS — I493 Ventricular premature depolarization: Secondary | ICD-10-CM

## 2022-11-05 MED ORDER — MAGNESIUM OXIDE -MG SUPPLEMENT 400 (240 MG) MG PO TABS: 400.0000 mg | ORAL_TABLET | Freq: Every day | ORAL | Status: DC

## 2022-11-05 NOTE — Progress Notes (Unsigned)
Enrolled patient for a 14 day Zio XT monitor to be mailed to patients home  [place] to read

## 2022-11-06 DIAGNOSIS — J449 Chronic obstructive pulmonary disease, unspecified: Secondary | ICD-10-CM | POA: Diagnosis not present

## 2022-11-06 DIAGNOSIS — R296 Repeated falls: Secondary | ICD-10-CM | POA: Diagnosis not present

## 2022-11-06 DIAGNOSIS — G4733 Obstructive sleep apnea (adult) (pediatric): Secondary | ICD-10-CM | POA: Diagnosis not present

## 2022-11-06 DIAGNOSIS — M79604 Pain in right leg: Secondary | ICD-10-CM | POA: Diagnosis not present

## 2022-11-09 ENCOUNTER — Encounter: Payer: Self-pay | Admitting: Student in an Organized Health Care Education/Training Program

## 2022-11-09 DIAGNOSIS — I493 Ventricular premature depolarization: Secondary | ICD-10-CM | POA: Diagnosis not present

## 2022-11-10 ENCOUNTER — Telehealth: Payer: Self-pay | Admitting: *Deleted

## 2022-11-10 ENCOUNTER — Ambulatory Visit: Payer: 59 | Admitting: Adult Health

## 2022-11-10 ENCOUNTER — Encounter: Payer: Self-pay | Admitting: Adult Health

## 2022-11-10 VITALS — BP 112/56 | HR 86 | Temp 97.9°F | Ht 66.0 in | Wt 175.0 lb

## 2022-11-10 DIAGNOSIS — J449 Chronic obstructive pulmonary disease, unspecified: Secondary | ICD-10-CM | POA: Diagnosis not present

## 2022-11-10 DIAGNOSIS — G4733 Obstructive sleep apnea (adult) (pediatric): Secondary | ICD-10-CM

## 2022-11-10 DIAGNOSIS — J849 Interstitial pulmonary disease, unspecified: Secondary | ICD-10-CM | POA: Diagnosis not present

## 2022-11-10 DIAGNOSIS — I5022 Chronic systolic (congestive) heart failure: Secondary | ICD-10-CM | POA: Diagnosis not present

## 2022-11-10 NOTE — Progress Notes (Signed)
GLUCOSE 128 (H) 11/02/2022 0945   BUN 21 11/02/2022 0945   BUN 11 07/27/2022 0949   CREATININE 1.58 (H) 11/02/2022 0945   CREATININE 0.72 08/14/2014 1554   CALCIUM 10.5 (H) 11/02/2022 0945   GFRNONAA 32 (L) 11/02/2022 0945   GFRNONAA 81 08/14/2014 1554   GFRAA 47 (L) 10/02/2019 1418   GFRAA >89 08/14/2014 1554    BNP    Component Value Date/Time   BNP 72.3 10/27/2022 1532    ProBNP    Component Value Date/Time   PROBNP 388.0 (H) 10/06/2022 1251    Imaging: ECHOCARDIOGRAM COMPLETE  Result Date: 10/20/2022    ECHOCARDIOGRAM REPORT   Patient Name:   Caitlyn Aguirre Date of Exam: 10/20/2022 Medical Rec #:  [id number]      Height:       68.0 in Accession #:    [id number]     Weight:       189.4 lb Date of Birth:  1935/06/28       BSA:          1.997 m Patient Age:    87 years       BP:           122/60 mmHg Patient Gender: F              HR:           106 bpm. Exam Location:  [address] Procedure: 2D Echo, Cardiac Doppler and Color Doppler Indications:    R06.00 Dyspnea  History:        Patient has prior history of Echocardiogram examinations, most                 recent 10/09/2022. CHF, CAD, COPD; Risk Factors:Hypertension,                 Diabetes and Sleep Apnea.  Sonographer:    Samule Ohm RDCS Referring Phys: 50 MURALI RAMASWAMY IMPRESSIONS  1. Left ventricular ejection fraction, by estimation, is 25 to 30%. The left ventricle has severely decreased function. The left ventricle demonstrates global hypokinesis. Left ventricular diastolic parameters are consistent with Grade II diastolic dysfunction (pseudonormalization). Elevated left ventricular end-diastolic pressure.  2. Right ventricular systolic function is moderately reduced. The right ventricular size is moderately enlarged.  3. The mitral valve is normal in structure. Mild mitral valve regurgitation. No evidence of mitral stenosis.  4. The tricuspid valve is abnormal. Tricuspid valve  regurgitation is mild to moderate.  5. The aortic valve is normal in structure. Aortic valve regurgitation is mild. Aortic valve sclerosis is present, with no evidence of aortic valve stenosis.  6. The pulmonic valve was abnormal. Pulmonic valve regurgitation is moderate to severe.  7. The inferior vena cava is normal in size with greater than 50% respiratory variability, suggesting right atrial pressure of 3 mmHg. FINDINGS  Left Ventricle: Left ventricular ejection fraction, by estimation, is 25 to 30%. The left ventricle has severely decreased function. The left ventricle demonstrates global hypokinesis. The left ventricular internal cavity size was normal in size. There is no left ventricular hypertrophy. Left ventricular diastolic parameters are consistent with Grade II diastolic dysfunction (pseudonormalization). Elevated left ventricular end-diastolic pressure. Right Ventricle: The right ventricular size is moderately enlarged. No increase in right ventricular wall thickness. Right ventricular systolic function is moderately reduced. Left Atrium: Left atrial size was normal in size. Right Atrium: Right atrial size was normal in size. Pericardium: There is no evidence of pericardial effusion. Mitral Valve: The mitral  @Patient  ID: Caitlyn Aguirre, female    DOB: 1935-09-03, 87 y.o.   MRN: [id number]  Chief Complaint  Patient presents with   Hospitalization Follow-up    Referring provider: [name]  HPI: 87 year old female seen for pulmonary consult October 06, 2022 for evaluation of pulmonary fibrosis and chronic respiratory failure Medical history significant for diabetes, stroke, hypertension, chronic anemia, chronic kidney disease, congestive heart failure  TEST/EVENTS :  On: 08/19/2022 08:16      Latest Reference Range & Units 05/31/21 14:24  Anti Nuclear Antibody (ANA) Negative  Negative  CCP Antibodies IgG/IgA 0 - 19 units <1  ds DNA Ab 0 - 9 IU/mL <1  RA Latex Turbid. <14.0 IU/mL <10.0  ENA SM Ab Ser-aCnc 0.0 - 0.9 AI <0.2  C3 Complement 82 - 167 mg/dL 841  Complement C4, Body Fluid 12 - 38 mg/dL 51 (H)  SSA (Ro) (ENA) Antibody, IgG 0.0 - 0.9 AI <0.2  SSB (La) (ENA) Antibody, IgG 0.0 - 0.9 AI <0.2    HRCT chest 08/19/22  Pulmonary parenchymal pattern of fibrosis, as detailed above, minimally progressive from 05/22/2021 and clearly progressive from 02/02/2018. Findings are categorized as probable UIP per consensus guidelines: Diagnosis of Idiopathic Pulmonary Fibrosis: An Official ATS/ERS/JRS/ALAT Clinical Practice Guideline. Am [name] Crit Care Med Vol 198, Iss 5, [phone], Oct 03 2016. 2. Aortic atherosclerosis (ICD10-I70.0). Coronary artery calcification. 3. Enlarged pulmonic trunk, indicative of pulmonary arterial hypertension. 4.  Emphysema (ICD10-J43.9).  2D echo October 09, 2022 showed EF at 30 to 35%, left ventricle with global hypokinesis, grade 1 diastolic dysfunction, RV systolic function mildly reduced.  RV size is normal.  Moderately elevated pulmonary systolic pressure with estimated RVSP P at 47.4 mmHg  Right heart cath October 12, 2022 normal left heart filling pressure, mildly elevated right heart filling pressure, moderate pulmonary  hypertension, normal fick cardiac output  2D echo October 20, 2022 showed EF at 25 to 30%, grade 2 diastolic dysfunction, RV size moderately enlarged   11/10/2022 Follow up: IPF, O2 RF , PAH, CHF  Patient returns for a 1 month follow-up.  Patient was seen this visit for a pulmonary consult for suspected pulmonary fibrosis.  Patient is accompanied by her daughter.  She complained over the last 10 years of getting more short of breath with activity and decreased activity tolerance.  Approximately 3 years ago she was started on oxygen to use with activity and at bedtime.  CT chest May 31, 2021 showed mild emphysema.  Superimposed subpleural reticulation/fibrosis in the lower lobe predominance with associated traction bronchiectasis felt to be progressive from 2020.  Autoimmune panel as above April 2023 was essentially negative.  High-resolution CT chest August 13, 2022 showed emphysema, minimal progression of subpleural reticulation and traction bronchiectasis but clearly progressive from January 2020.  Last visit patient was found to have probable IPF with the findings on the above workup.  Patient was set up for a pulmonary function test.  She was also set up for a 2D echo and referred to Cardiology for consideration for Right heart cath.  Unfortunately shortly after her office visit she was admitted with acute on chronic decompensated systolic and diastolic heart failure.  2D echo showed EF decreased at 30 to 35%.  She underwent a right heart cath that showed RV failure and moderate pulmonary hypertension.  Patient was seen by cardiology and started on Entresto, Jardiance and Lasix.  She improved with diuresis.  She does have chronic kidney disease. Since discharge  GLUCOSE 128 (H) 11/02/2022 0945   BUN 21 11/02/2022 0945   BUN 11 07/27/2022 0949   CREATININE 1.58 (H) 11/02/2022 0945   CREATININE 0.72 08/14/2014 1554   CALCIUM 10.5 (H) 11/02/2022 0945   GFRNONAA 32 (L) 11/02/2022 0945   GFRNONAA 81 08/14/2014 1554   GFRAA 47 (L) 10/02/2019 1418   GFRAA >89 08/14/2014 1554    BNP    Component Value Date/Time   BNP 72.3 10/27/2022 1532    ProBNP    Component Value Date/Time   PROBNP 388.0 (H) 10/06/2022 1251    Imaging: ECHOCARDIOGRAM COMPLETE  Result Date: 10/20/2022    ECHOCARDIOGRAM REPORT   Patient Name:   Caitlyn Aguirre Date of Exam: 10/20/2022 Medical Rec #:  [id number]      Height:       68.0 in Accession #:    [id number]     Weight:       189.4 lb Date of Birth:  1935/06/28       BSA:          1.997 m Patient Age:    87 years       BP:           122/60 mmHg Patient Gender: F              HR:           106 bpm. Exam Location:  [address] Procedure: 2D Echo, Cardiac Doppler and Color Doppler Indications:    R06.00 Dyspnea  History:        Patient has prior history of Echocardiogram examinations, most                 recent 10/09/2022. CHF, CAD, COPD; Risk Factors:Hypertension,                 Diabetes and Sleep Apnea.  Sonographer:    Samule Ohm RDCS Referring Phys: 50 MURALI RAMASWAMY IMPRESSIONS  1. Left ventricular ejection fraction, by estimation, is 25 to 30%. The left ventricle has severely decreased function. The left ventricle demonstrates global hypokinesis. Left ventricular diastolic parameters are consistent with Grade II diastolic dysfunction (pseudonormalization). Elevated left ventricular end-diastolic pressure.  2. Right ventricular systolic function is moderately reduced. The right ventricular size is moderately enlarged.  3. The mitral valve is normal in structure. Mild mitral valve regurgitation. No evidence of mitral stenosis.  4. The tricuspid valve is abnormal. Tricuspid valve  regurgitation is mild to moderate.  5. The aortic valve is normal in structure. Aortic valve regurgitation is mild. Aortic valve sclerosis is present, with no evidence of aortic valve stenosis.  6. The pulmonic valve was abnormal. Pulmonic valve regurgitation is moderate to severe.  7. The inferior vena cava is normal in size with greater than 50% respiratory variability, suggesting right atrial pressure of 3 mmHg. FINDINGS  Left Ventricle: Left ventricular ejection fraction, by estimation, is 25 to 30%. The left ventricle has severely decreased function. The left ventricle demonstrates global hypokinesis. The left ventricular internal cavity size was normal in size. There is no left ventricular hypertrophy. Left ventricular diastolic parameters are consistent with Grade II diastolic dysfunction (pseudonormalization). Elevated left ventricular end-diastolic pressure. Right Ventricle: The right ventricular size is moderately enlarged. No increase in right ventricular wall thickness. Right ventricular systolic function is moderately reduced. Left Atrium: Left atrial size was normal in size. Right Atrium: Right atrial size was normal in size. Pericardium: There is no evidence of pericardial effusion. Mitral Valve: The mitral  GLUCOSE 128 (H) 11/02/2022 0945   BUN 21 11/02/2022 0945   BUN 11 07/27/2022 0949   CREATININE 1.58 (H) 11/02/2022 0945   CREATININE 0.72 08/14/2014 1554   CALCIUM 10.5 (H) 11/02/2022 0945   GFRNONAA 32 (L) 11/02/2022 0945   GFRNONAA 81 08/14/2014 1554   GFRAA 47 (L) 10/02/2019 1418   GFRAA >89 08/14/2014 1554    BNP    Component Value Date/Time   BNP 72.3 10/27/2022 1532    ProBNP    Component Value Date/Time   PROBNP 388.0 (H) 10/06/2022 1251    Imaging: ECHOCARDIOGRAM COMPLETE  Result Date: 10/20/2022    ECHOCARDIOGRAM REPORT   Patient Name:   Caitlyn Aguirre Date of Exam: 10/20/2022 Medical Rec #:  [id number]      Height:       68.0 in Accession #:    [id number]     Weight:       189.4 lb Date of Birth:  1935/06/28       BSA:          1.997 m Patient Age:    87 years       BP:           122/60 mmHg Patient Gender: F              HR:           106 bpm. Exam Location:  [address] Procedure: 2D Echo, Cardiac Doppler and Color Doppler Indications:    R06.00 Dyspnea  History:        Patient has prior history of Echocardiogram examinations, most                 recent 10/09/2022. CHF, CAD, COPD; Risk Factors:Hypertension,                 Diabetes and Sleep Apnea.  Sonographer:    Samule Ohm RDCS Referring Phys: 50 MURALI RAMASWAMY IMPRESSIONS  1. Left ventricular ejection fraction, by estimation, is 25 to 30%. The left ventricle has severely decreased function. The left ventricle demonstrates global hypokinesis. Left ventricular diastolic parameters are consistent with Grade II diastolic dysfunction (pseudonormalization). Elevated left ventricular end-diastolic pressure.  2. Right ventricular systolic function is moderately reduced. The right ventricular size is moderately enlarged.  3. The mitral valve is normal in structure. Mild mitral valve regurgitation. No evidence of mitral stenosis.  4. The tricuspid valve is abnormal. Tricuspid valve  regurgitation is mild to moderate.  5. The aortic valve is normal in structure. Aortic valve regurgitation is mild. Aortic valve sclerosis is present, with no evidence of aortic valve stenosis.  6. The pulmonic valve was abnormal. Pulmonic valve regurgitation is moderate to severe.  7. The inferior vena cava is normal in size with greater than 50% respiratory variability, suggesting right atrial pressure of 3 mmHg. FINDINGS  Left Ventricle: Left ventricular ejection fraction, by estimation, is 25 to 30%. The left ventricle has severely decreased function. The left ventricle demonstrates global hypokinesis. The left ventricular internal cavity size was normal in size. There is no left ventricular hypertrophy. Left ventricular diastolic parameters are consistent with Grade II diastolic dysfunction (pseudonormalization). Elevated left ventricular end-diastolic pressure. Right Ventricle: The right ventricular size is moderately enlarged. No increase in right ventricular wall thickness. Right ventricular systolic function is moderately reduced. Left Atrium: Left atrial size was normal in size. Right Atrium: Right atrial size was normal in size. Pericardium: There is no evidence of pericardial effusion. Mitral Valve: The mitral  @Patient  ID: Caitlyn Aguirre, female    DOB: 1935-09-03, 87 y.o.   MRN: [id number]  Chief Complaint  Patient presents with   Hospitalization Follow-up    Referring provider: [name]  HPI: 87 year old female seen for pulmonary consult October 06, 2022 for evaluation of pulmonary fibrosis and chronic respiratory failure Medical history significant for diabetes, stroke, hypertension, chronic anemia, chronic kidney disease, congestive heart failure  TEST/EVENTS :  On: 08/19/2022 08:16      Latest Reference Range & Units 05/31/21 14:24  Anti Nuclear Antibody (ANA) Negative  Negative  CCP Antibodies IgG/IgA 0 - 19 units <1  ds DNA Ab 0 - 9 IU/mL <1  RA Latex Turbid. <14.0 IU/mL <10.0  ENA SM Ab Ser-aCnc 0.0 - 0.9 AI <0.2  C3 Complement 82 - 167 mg/dL 841  Complement C4, Body Fluid 12 - 38 mg/dL 51 (H)  SSA (Ro) (ENA) Antibody, IgG 0.0 - 0.9 AI <0.2  SSB (La) (ENA) Antibody, IgG 0.0 - 0.9 AI <0.2    HRCT chest 08/19/22  Pulmonary parenchymal pattern of fibrosis, as detailed above, minimally progressive from 05/22/2021 and clearly progressive from 02/02/2018. Findings are categorized as probable UIP per consensus guidelines: Diagnosis of Idiopathic Pulmonary Fibrosis: An Official ATS/ERS/JRS/ALAT Clinical Practice Guideline. Am [name] Crit Care Med Vol 198, Iss 5, [phone], Oct 03 2016. 2. Aortic atherosclerosis (ICD10-I70.0). Coronary artery calcification. 3. Enlarged pulmonic trunk, indicative of pulmonary arterial hypertension. 4.  Emphysema (ICD10-J43.9).  2D echo October 09, 2022 showed EF at 30 to 35%, left ventricle with global hypokinesis, grade 1 diastolic dysfunction, RV systolic function mildly reduced.  RV size is normal.  Moderately elevated pulmonary systolic pressure with estimated RVSP P at 47.4 mmHg  Right heart cath October 12, 2022 normal left heart filling pressure, mildly elevated right heart filling pressure, moderate pulmonary  hypertension, normal fick cardiac output  2D echo October 20, 2022 showed EF at 25 to 30%, grade 2 diastolic dysfunction, RV size moderately enlarged   11/10/2022 Follow up: IPF, O2 RF , PAH, CHF  Patient returns for a 1 month follow-up.  Patient was seen this visit for a pulmonary consult for suspected pulmonary fibrosis.  Patient is accompanied by her daughter.  She complained over the last 10 years of getting more short of breath with activity and decreased activity tolerance.  Approximately 3 years ago she was started on oxygen to use with activity and at bedtime.  CT chest May 31, 2021 showed mild emphysema.  Superimposed subpleural reticulation/fibrosis in the lower lobe predominance with associated traction bronchiectasis felt to be progressive from 2020.  Autoimmune panel as above April 2023 was essentially negative.  High-resolution CT chest August 13, 2022 showed emphysema, minimal progression of subpleural reticulation and traction bronchiectasis but clearly progressive from January 2020.  Last visit patient was found to have probable IPF with the findings on the above workup.  Patient was set up for a pulmonary function test.  She was also set up for a 2D echo and referred to Cardiology for consideration for Right heart cath.  Unfortunately shortly after her office visit she was admitted with acute on chronic decompensated systolic and diastolic heart failure.  2D echo showed EF decreased at 30 to 35%.  She underwent a right heart cath that showed RV failure and moderate pulmonary hypertension.  Patient was seen by cardiology and started on Entresto, Jardiance and Lasix.  She improved with diuresis.  She does have chronic kidney disease. Since discharge  @Patient  ID: Caitlyn Aguirre, female    DOB: 1935-09-03, 87 y.o.   MRN: [id number]  Chief Complaint  Patient presents with   Hospitalization Follow-up    Referring provider: [name]  HPI: 87 year old female seen for pulmonary consult October 06, 2022 for evaluation of pulmonary fibrosis and chronic respiratory failure Medical history significant for diabetes, stroke, hypertension, chronic anemia, chronic kidney disease, congestive heart failure  TEST/EVENTS :  On: 08/19/2022 08:16      Latest Reference Range & Units 05/31/21 14:24  Anti Nuclear Antibody (ANA) Negative  Negative  CCP Antibodies IgG/IgA 0 - 19 units <1  ds DNA Ab 0 - 9 IU/mL <1  RA Latex Turbid. <14.0 IU/mL <10.0  ENA SM Ab Ser-aCnc 0.0 - 0.9 AI <0.2  C3 Complement 82 - 167 mg/dL 841  Complement C4, Body Fluid 12 - 38 mg/dL 51 (H)  SSA (Ro) (ENA) Antibody, IgG 0.0 - 0.9 AI <0.2  SSB (La) (ENA) Antibody, IgG 0.0 - 0.9 AI <0.2    HRCT chest 08/19/22  Pulmonary parenchymal pattern of fibrosis, as detailed above, minimally progressive from 05/22/2021 and clearly progressive from 02/02/2018. Findings are categorized as probable UIP per consensus guidelines: Diagnosis of Idiopathic Pulmonary Fibrosis: An Official ATS/ERS/JRS/ALAT Clinical Practice Guideline. Am [name] Crit Care Med Vol 198, Iss 5, [phone], Oct 03 2016. 2. Aortic atherosclerosis (ICD10-I70.0). Coronary artery calcification. 3. Enlarged pulmonic trunk, indicative of pulmonary arterial hypertension. 4.  Emphysema (ICD10-J43.9).  2D echo October 09, 2022 showed EF at 30 to 35%, left ventricle with global hypokinesis, grade 1 diastolic dysfunction, RV systolic function mildly reduced.  RV size is normal.  Moderately elevated pulmonary systolic pressure with estimated RVSP P at 47.4 mmHg  Right heart cath October 12, 2022 normal left heart filling pressure, mildly elevated right heart filling pressure, moderate pulmonary  hypertension, normal fick cardiac output  2D echo October 20, 2022 showed EF at 25 to 30%, grade 2 diastolic dysfunction, RV size moderately enlarged   11/10/2022 Follow up: IPF, O2 RF , PAH, CHF  Patient returns for a 1 month follow-up.  Patient was seen this visit for a pulmonary consult for suspected pulmonary fibrosis.  Patient is accompanied by her daughter.  She complained over the last 10 years of getting more short of breath with activity and decreased activity tolerance.  Approximately 3 years ago she was started on oxygen to use with activity and at bedtime.  CT chest May 31, 2021 showed mild emphysema.  Superimposed subpleural reticulation/fibrosis in the lower lobe predominance with associated traction bronchiectasis felt to be progressive from 2020.  Autoimmune panel as above April 2023 was essentially negative.  High-resolution CT chest August 13, 2022 showed emphysema, minimal progression of subpleural reticulation and traction bronchiectasis but clearly progressive from January 2020.  Last visit patient was found to have probable IPF with the findings on the above workup.  Patient was set up for a pulmonary function test.  She was also set up for a 2D echo and referred to Cardiology for consideration for Right heart cath.  Unfortunately shortly after her office visit she was admitted with acute on chronic decompensated systolic and diastolic heart failure.  2D echo showed EF decreased at 30 to 35%.  She underwent a right heart cath that showed RV failure and moderate pulmonary hypertension.  Patient was seen by cardiology and started on Entresto, Jardiance and Lasix.  She improved with diuresis.  She does have chronic kidney disease. Since discharge

## 2022-11-10 NOTE — Assessment & Plan Note (Signed)
Patient with a history of severe sleep apnea-unclear if she is using CPAP.  Will have to discuss this at her next follow-up visit.

## 2022-11-10 NOTE — Patient Instructions (Addendum)
Continue on Oxygen 2l/m with activity and At bedtime  .  Continue on Spiriva 2 puffs daily  Set up PFT -Spirometry with DLCO  We will discuss medications for your Pulmonary Fibrosis and Pulmonary Hypertension at next visit  Follow up in 4 weeks with Dr. Marchelle Gearing or Kina Shiffman NP and As needed

## 2022-11-10 NOTE — Assessment & Plan Note (Signed)
Recent hospitalization with decompensated systolic and diastolic heart failure.  Improved with diuresis.  Patient is being managed by the CHF/cardiology team.  Continue on current regimen.  Appears to be euvolemic on exam.  No evidence of acute volume overload.

## 2022-11-10 NOTE — Addendum Note (Signed)
Addended by: Delrae Rend on: 11/10/2022 01:37 PM   Modules accepted: Orders

## 2022-11-10 NOTE — Assessment & Plan Note (Signed)
Probable IPF-high-resolution CT chest shows probable UIP with progressive changes-subpleural reticulation and traction bronchiectasis.  Most recent high-res CT chest July 2024 showed progression from 2020.  Patient has had progressive clinical decline with increased shortness of breath, decreased activity tolerance and oxygen dependence.  She also has moderate pulmonary hypertension on right heart cath-WHO group 2 and 3.  We discussed antifibrotic therapy consideration of pulmonary artery hypertension treatment as well.  We discussed potential side effect profile.  She remains very frail from recent hospitalization and is just now starting to slowly recover. For now we we will set up for PFTs with spirometry and DLCO.  On return visit in 4 weeks we will discuss further antifibrotic therapy and pulmonary hypertension medications. For now continue on oxygen to maintain O2 saturations greater than 88 to 90%.  Also continue on diuretics to prevent volume overload.   Plan  Patient Instructions  Continue on Oxygen 2l/m with activity and At bedtime  .  Continue on Spiriva 2 puffs daily  Set up PFT -Spirometry with DLCO  We will discuss medications for your Pulmonary Fibrosis and Pulmonary Hypertension at next visit  Follow up in 4 weeks with Dr. Marchelle Gearing or Quinlan Mcfall NP and As needed

## 2022-11-10 NOTE — Telephone Encounter (Signed)
Called and spoke with patient's daughter, Elease Hashimoto, verified that she has not warn a CPAP in many years.  Nothing further needed.

## 2022-11-10 NOTE — Assessment & Plan Note (Signed)
COPD with emphysema.  Continue on Spiriva.  Check PFTs on return

## 2022-11-13 ENCOUNTER — Ambulatory Visit: Payer: Self-pay

## 2022-11-13 NOTE — Patient Instructions (Signed)
Visit Information  Thank you for taking time to visit with me today. Please don't hesitate to contact me if I can be of assistance to you.   Following are the goals we discussed today:   Goals Addressed             This Visit's Progress    Patient goals  monitor and manage CHF       Patient Goals/Self Care Activities: -Patient/Caregiver will take medications as prescribed   -Patient/Caregiver will attend all scheduled provider appointments -Patient/Caregiver will call pharmacy for medication refills 3-7 days in advance of running out of medications -Patient/Caregiver will call provider office for new concerns or questions  -Patient/Caregiver will focus on medication adherence by taking medications as prescribed   Provided education on low sodium diet Reviewed Heart Failure Action Plan in depth  Provided education about placing scale on hard, flat surface Advised patient to weigh each morning  Discussed the importance of keeping all appointments with provider Will send the patient educational material on CHF         Our next appointment is by telephone on 11/26/22 at 3 pm  Please call the care guide team at [phone] if you need to cancel or reschedule your appointment.   If you are experiencing a Mental Health or Behavioral Health Crisis or need someone to talk to, please call 1-800-273-TALK (toll free, 24 hour hotline)  Patient verbalizes understanding of instructions and care plan provided today and agrees to view in MyChart. Active MyChart status and patient understanding of how to access instructions and care plan via MyChart confirmed with patient.     Juanell Fairly RN, BSN, [facility] [address] [employer] Phone: [phone]

## 2022-11-13 NOTE — Patient Outreach (Signed)
Care Coordination   Initial Visit Note   11/13/2022 Name: Caitlyn Aguirre MRN: [id number] DOB: [birth year]  Caitlyn Aguirre is a 87 y.o. year old female who sees Oswaldo Done, Marquita Palms, MD for primary care. I spoke with  Caitlyn Aguirre by phone today.  What matters to the patients health and wellness today?  Today, I had a conversation with Caitlyn Aguirre regarding her recent diagnosis of congestive heart failure. I emphasized the importance of daily weighing, and this morning her weight measured 173 lbs. We discussed the action plan, and I stressed that it's a daily evaluation tool. We went over her medications, and she understands that if she experiences a sudden weight gain of 3 lbs in a day or 5 lbs in a week, or if anything else feels unusual, she should contact her doctor. I will be sending her educational materials on congestive heart failure. Caitlyn Aguirre also informed me that she monitors her blood sugars, her last A1C measurement was 6.3, and she requires 2 liters of oxygen, which she uses at night and during the day. Additionally, she uses inhalers.      Goals Addressed             This Visit's Progress    Patient goals  monitor and manage CHF       Patient Goals/Self Care Activities: -Patient/Caregiver will take medications as prescribed   -Patient/Caregiver will attend all scheduled provider appointments -Patient/Caregiver will call pharmacy for medication refills 3-7 days in advance of running out of medications -Patient/Caregiver will call provider office for new concerns or questions  -Patient/Caregiver will focus on medication adherence by taking medications as prescribed   Provided education on low sodium diet Reviewed Heart Failure Action Plan in depth  Provided education about placing scale on hard, flat surface Advised patient to weigh each morning  Discussed the importance of keeping all appointments with provider Will send the patient educational material on  CHF         SDOH assessments and interventions completed:  Yes  SDOH Interventions Today    Flowsheet Row Most Recent Value  SDOH Interventions   Food Insecurity Interventions Intervention Not Indicated  Transportation Interventions Intervention Not Indicated        Care Coordination Interventions:  Yes, provided   Interventions Today    Flowsheet Row Most Recent Value  Chronic Disease   Chronic disease during today's visit Congestive Heart Failure (CHF)  General Interventions   General Interventions Discussed/Reviewed General Interventions Discussed, General Interventions Reviewed  Education Interventions   Education Provided Provided Education  Provided Verbal Education On Nutrition, Medication  Nutrition Interventions   Nutrition Discussed/Reviewed Nutrition Discussed, Decreasing salt  Pharmacy Interventions   Pharmacy Dicussed/Reviewed Pharmacy Topics Discussed  Safety Interventions   Safety Discussed/Reviewed Safety Discussed        Follow up plan: Follow up call scheduled for 11/26/22  3 pm    Encounter Outcome:  Patient Visit Completed   Juanell Fairly RN, BSN, [facility]   Care Coordinator Phone: [phone]

## 2022-11-26 ENCOUNTER — Ambulatory Visit: Payer: Self-pay

## 2022-11-26 NOTE — Patient Outreach (Signed)
Care Coordination   Follow Up Visit Note   11/26/2022 Name: EL VANWINGERDEN MRN: [id number] DOB: 1935-05-06  [name] is a 87 y.o. year old female who sees Oswaldo Done, Marquita Palms, MD for primary care. I spoke with  [name] by phone today.  What matters to the patients health and wellness today?  Mrs. Kia Carandang reported that she is doing well. However, she has not yet received the educational materials I sent her in the mail. I assured her that we would wait a bit longer for them, and if they don't arrive, I will include them when I send out the next package. She denies experiencing any chest pain, shortness of breath, or swelling. Mrs. Kice is tracking her weight daily, and today it was 174 pounds. She is using her oxygen both during the day and at night at a flow rate of 2 liters and is consistently taking her medications. She wore a heart monitor for 14 days and returned it today. She is currently waiting to receive the results from that test.     Goals Addressed             This Visit's Progress    Patient goals  monitor and manage CHF       Patient Goals/Self Care Activities: -Patient/Caregiver will take medications as prescribed   -Patient/Caregiver will attend all scheduled provider appointments -Patient/Caregiver will call pharmacy for medication refills 3-7 days in advance of running out of medications -Patient/Caregiver will call provider office for new concerns or questions  -Patient/Caregiver will focus on medication adherence by taking medications as prescribed   Provided education on low sodium diet Reviewed Heart Failure Action Plan in depth  Provided education about placing scale on hard, flat surface Advised patient to weigh each morning  Discussed the importance of keeping all appointments with provider Will send the patient educational material on CHF Wt Readings from Last 3 Encounters:  11/10/22 175 lb (79.4 kg)  11/03/22 178 lb (80.7 kg)   11/02/22 176 lb 6.4 oz (80 kg)            SDOH assessments and interventions completed:  No     Care Coordination Interventions:  Yes, provided   Interventions Today    Flowsheet Row Most Recent Value  Chronic Disease   Chronic disease during today's visit Congestive Heart Failure (CHF)  General Interventions   General Interventions Discussed/Reviewed General Interventions Discussed, General Interventions Reviewed  Education Interventions   Provided Verbal Education On Nutrition, Medication  Pharmacy Interventions   Pharmacy Dicussed/Reviewed Pharmacy Topics Discussed  Safety Interventions   Safety Discussed/Reviewed Safety Discussed        Follow up plan: Follow up call scheduled for 12/29/22  230 pm    Encounter Outcome:  Patient Visit Completed   Juanell Fairly RN, BSN, [facility]   Care Coordinator Phone: [phone]

## 2022-11-26 NOTE — Telephone Encounter (Signed)
This encounter was created in error - please disregard.

## 2022-11-26 NOTE — Patient Instructions (Signed)
Visit Information  Thank you for taking time to visit with me today. Please don't hesitate to contact me if I can be of assistance to you.   Following are the goals we discussed today:   Goals Addressed             This Visit's Progress    Patient goals  monitor and manage CHF       Patient Goals/Self Care Activities: -Patient/Caregiver will take medications as prescribed   -Patient/Caregiver will attend all scheduled provider appointments -Patient/Caregiver will call pharmacy for medication refills 3-7 days in advance of running out of medications -Patient/Caregiver will call provider office for new concerns or questions  -Patient/Caregiver will focus on medication adherence by taking medications as prescribed   Provided education on low sodium diet Reviewed Heart Failure Action Plan in depth  Provided education about placing scale on hard, flat surface Advised patient to weigh each morning  Discussed the importance of keeping all appointments with provider Will send the patient educational material on CHF Wt Readings from Last 3 Encounters:  11/10/22 175 lb (79.4 kg)  11/03/22 178 lb (80.7 kg)  11/02/22 176 lb 6.4 oz (80 kg)            Our next appointment is by telephone on 12/29/22 at 230 pm  Please call the care guide team at [phone] if you need to cancel or reschedule your appointment.   If you are experiencing a Mental Health or Behavioral Health Crisis or need someone to talk to, please call 1-800-273-TALK (toll free, 24 hour hotline)  Patient verbalizes understanding of instructions and care plan provided today and agrees to view in MyChart. Active MyChart status and patient understanding of how to access instructions and care plan via MyChart confirmed with patient.     Juanell Fairly RN, BSN, [facility] [employer] Phone: [phone]

## 2022-11-30 DIAGNOSIS — I493 Ventricular premature depolarization: Secondary | ICD-10-CM | POA: Diagnosis not present

## 2022-12-07 DIAGNOSIS — G4733 Obstructive sleep apnea (adult) (pediatric): Secondary | ICD-10-CM | POA: Diagnosis not present

## 2022-12-07 DIAGNOSIS — M79604 Pain in right leg: Secondary | ICD-10-CM | POA: Diagnosis not present

## 2022-12-07 DIAGNOSIS — R296 Repeated falls: Secondary | ICD-10-CM | POA: Diagnosis not present

## 2022-12-07 DIAGNOSIS — J449 Chronic obstructive pulmonary disease, unspecified: Secondary | ICD-10-CM | POA: Diagnosis not present

## 2022-12-09 ENCOUNTER — Other Ambulatory Visit: Payer: Self-pay | Admitting: Student

## 2022-12-09 ENCOUNTER — Other Ambulatory Visit: Payer: Self-pay

## 2022-12-09 MED ORDER — METOPROLOL SUCCINATE ER 25 MG PO TB24: 12.5000 mg | ORAL_TABLET | Freq: Every day | ORAL | 1 refills | Status: DC

## 2022-12-11 ENCOUNTER — Telehealth: Payer: Self-pay | Admitting: *Deleted

## 2022-12-11 NOTE — Telephone Encounter (Signed)
I do not yet see any notes about starting metoprolol. The last time we tried her on a beta blocker she had light headedness and dizziness. Which cardiologist told her to start metoprolol and when was that visit?

## 2022-12-11 NOTE — Telephone Encounter (Signed)
Call from patient's daughter states was told to start Metoprolol 12.5 mg daily by her Cardiologist.  Daughter states office told her to check with her PCP to make sure that she can take with the rest of her medications.  Please return call to daughter as she is holding until she hears from patient's PCP.

## 2022-12-15 ENCOUNTER — Telehealth: Payer: Self-pay

## 2022-12-15 NOTE — Patient Outreach (Signed)
  Care Coordination   Follow Up Visit Note   12/15/2022 Name: Caitlyn Aguirre MRN: [id number] DOB: 1935-09-01  Caitlyn Aguirre is a 87 y.o. year old female who sees Oswaldo Done, Marquita Palms, MD for primary care. I spoke with  Caitlyn Aguirre by phone today.  What matters to the patients health and wellness today?  I received a phone call from Mrs. Dia Crawford, who reported that her mother is experiencing issues with swelling in her feet. She inquired about specific instructions concerning the administration of her Lasix medication. I informed her that during her mother's most recent visit, the physician advised that the medication may be taken as needed. Furthermore, I highlighted the importance of maintaining after-visit summaries that document the instructions provided during the appointment for the family's reference. Lastly, I provided Mrs. McCray with my available days and times for work.      SDOH assessments and interventions completed:  No     Care Coordination Interventions:  Yes, provided   Follow Up Plan: RNCM will follow up at the next scheduled Interval.   Encounter Outcome:  Patient Visit Completed   Juanell Fairly RN, BSN, [facility]  [facility], [facility]  Care Coordinator Phone: [phone]

## 2022-12-15 NOTE — Telephone Encounter (Signed)
Call to patient spoke with her daughter about the Metoprolol.  Patient has swelling in her legs and does not feel well.  Would like to come in for an appointment to be seen on tomorrow.  Patient was given an appointment for 10:15 AM on tomorrow.

## 2022-12-16 ENCOUNTER — Ambulatory Visit: Payer: 59 | Admitting: Internal Medicine

## 2022-12-16 VITALS — BP 140/87 | HR 67 | Ht 66.0 in | Wt 180.4 lb

## 2022-12-16 DIAGNOSIS — F32A Depression, unspecified: Secondary | ICD-10-CM | POA: Diagnosis not present

## 2022-12-16 DIAGNOSIS — I5023 Acute on chronic systolic (congestive) heart failure: Secondary | ICD-10-CM

## 2022-12-16 DIAGNOSIS — I5022 Chronic systolic (congestive) heart failure: Secondary | ICD-10-CM

## 2022-12-16 LAB — BRAIN NATRIURETIC PEPTIDE: B Natriuretic Peptide: 2704.7 pg/mL — ABNORMAL HIGH (ref 0.0–100.0)

## 2022-12-16 MED ORDER — PAROXETINE HCL 30 MG PO TABS: 30.0000 mg | ORAL_TABLET | Freq: Every day | ORAL | 3 refills | Status: DC
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
## 2022-12-16 NOTE — Patient Instructions (Signed)
Thank you, [name] for allowing [place] to provide your care today.   Stop taking metoprolol. I am getting blood work to evaluate for heart failure exacerbation. If that is high then I would like you to continue lasix.  I have ordered the following labs for you:   Lab Orders         Brain natriuretic peptide       I have ordered the following medication/changed the following medications:   Stop the following medications: Medications Discontinued During This Encounter  Medication Reason   PARoxetine (PAXIL) 30 MG tablet Reorder   metoprolol succinate (TOPROL XL) 25 MG 24 hr tablet      Start the following medications: Meds ordered this encounter  Medications   PARoxetine (PAXIL) 30 MG tablet    Sig: Take 1 tablet (30 mg total) by mouth daily.    Dispense:  90 tablet    Refill:  3     Follow up:  1-2 weeks    We look forward to seeing you next time. Please call our clinic at [phone] if you have any questions or concerns. The best time to call is Monday-Friday from 9am-4pm, but there is someone available 24/7. If after hours or the weekend, call the main hospital number and ask for the Internal Medicine Resident On-Call. If you need medication refills, please notify your pharmacy one week in advance and they will send [place] a request.   Thank you for trusting me with your care. Wishing you the best!   Rudene Christians, DO [facility]

## 2022-12-17 DIAGNOSIS — I5023 Acute on chronic systolic (congestive) heart failure: Secondary | ICD-10-CM | POA: Insufficient documentation

## 2022-12-17 NOTE — Assessment & Plan Note (Addendum)
Echo 09/17 with EF 25-30%, global left ventricle hypokinesis and moderately reduced right ventricular function. RHC 09/09 showed moderate pulmonary hypertension.  She is presenting with shortness of breath and 5 lbs weight gain over last week. On exam she appears euvolemic. BNP elevated in 2,000.  Orthostatics vitals negative. A: She has not needed increased oxygen to maintain saturations. Her extremities are warm and well perfused. I think that her heart failure exacerbation can be managed in the outpatient setting.  P: I called and reviewed result with patient and her daughter 11/13. I asked that they continue lasix 40 mg daily for next week. Ideally will have patient follow-up mid week to make sure that symptoms are improving. With pulmonary hypertension and HFrEF, gentle diuresis is important.  I am concerned that part of presentation is from addition of metoprolol. I shared this concern with them and we removed this medication from her other bottles. Her daughter separated into folder.  Return precautions given

## 2022-12-24 NOTE — Progress Notes (Signed)
Internal Medicine Clinic Attending  Case discussed with the resident at the time of the visit.  We reviewed the resident's history and exam and pertinent patient test results.  I agree with the assessment, diagnosis, and plan of care documented in the resident's note.  

## 2022-12-28 ENCOUNTER — Telehealth: Payer: Self-pay | Admitting: *Deleted

## 2022-12-28 ENCOUNTER — Encounter: Payer: Self-pay | Admitting: Student in an Organized Health Care Education/Training Program

## 2022-12-28 ENCOUNTER — Ambulatory Visit (INDEPENDENT_AMBULATORY_CARE_PROVIDER_SITE_OTHER): Payer: 59 | Admitting: Student in an Organized Health Care Education/Training Program

## 2022-12-28 VITALS — BP 132/81 | HR 52 | Temp 97.6°F | Ht 66.0 in | Wt 174.0 lb

## 2022-12-28 DIAGNOSIS — I10 Essential (primary) hypertension: Secondary | ICD-10-CM | POA: Diagnosis not present

## 2022-12-28 DIAGNOSIS — I5022 Chronic systolic (congestive) heart failure: Secondary | ICD-10-CM | POA: Diagnosis not present

## 2022-12-28 DIAGNOSIS — J849 Interstitial pulmonary disease, unspecified: Secondary | ICD-10-CM

## 2022-12-28 NOTE — Assessment & Plan Note (Addendum)
Interstitial lung disease managed with pulmonology.  Last saw pulmonology in October where they discussed possibly starting antifibrotic agents.  Does not have a follow-up currently planned, but I would recommend one.  She is continuing supplemental oxygen 2 L daily as needed, mostly just needs it for exertion.  Continuing Spiriva inhaler daily.  She continues to have difficulty with ambulation due to combination of interstitial lung disease and chronic heart failure with reduced ejection fraction causing severe dyspnea with exertion.  She also has bilateral knee osteoarthritis which makes it hard for her to get up from sitting and transfer.  In August I referred for a electric scooter to help with her independence, help with mobilization, transfers, and improve ability to do activities of daily living.  We are working to get her an evaluation with neuro rehab for that equipment.

## 2022-12-28 NOTE — Patient Instructions (Signed)
Stop taking amlodipine

## 2022-12-28 NOTE — Telephone Encounter (Signed)
Call to inform [facility] that patient is no longer on Amlodipine 5 mg tablets.  Spoke to [employer] who will delete from patient's med list.

## 2022-12-28 NOTE — Assessment & Plan Note (Signed)
Recently had some hypervolemia that was treated with daily diuresis over about a 1 week period.  She did well with that, looks more euvolemic today on exam.  Plan to use Lasix only daily as needed at this point.  Will continue with Jardiance 10 mg daily, Entresto 100 mg daily.  Had a bad reaction to low-dose beta-blocker recently, so this is discontinued.  Seems to be well compensated today with an acceptable quality of life.

## 2022-12-28 NOTE — Progress Notes (Signed)
Established Patient Office Visit  Subjective   Patient ID: Caitlyn Aguirre, female    DOB: 07/03/1935  Age: 87 y.o. MRN: [id number]  Chief Complaint  Patient presents with   Follow-up    HPI  87 year old person here for follow-up of diabetes and heart failure.  Recently had a mild exacerbation with some hypervolemia that was treated as an outpatient with daily diuresis for about 1 week.  Did well with that.  Reports that her breathing is back to its baseline.  Can ambulate around the house.  Struggles with ambulation outside the house.  Can only walk about 100 feet before needing to rest.  Relies on her daughter for help with activities of daily living.  Uses supplemental oxygen for any exertion.  Good adherence with medications.  Recently switched Lasix back to daily as needed.  No fevers or chills.  No chest pain.  No cough or sputum production.    Objective:     BP 132/81 (BP Location: Right Arm, Patient Position: Sitting, Cuff Size: Small)   Pulse (!) 52   Temp 97.6 F (36.4 C) (Oral)   Ht 5\' 6"  (1.676 m)   Wt 174 lb (78.9 kg)   SpO2 96%   BMI 28.08 kg/m    Physical Exam  Gen: Well-appearing, no distress Lungs: Clear throughout, no crackles Heart: Regular, 3 out of 6 early systolic murmur at the left upper sternal border Extremities: Warm well-perfused with no lower extremity edema today     Assessment & Plan:   Problem List Items Addressed This Visit       High   Essential hypertension (Chronic)    Blood pressure is at goal today at 132/81.  There was some confusion she had amlodipine that she was using.  Looks like this was discontinued at hospitalization in September due to her reduced ejection fraction.  Her mail order pharmacy was still sending her refills, we called [facility] and asked them to discontinue the amlodipine prescription.      ILD (interstitial lung disease) (HCC) (Chronic)    Interstitial lung disease managed with pulmonology.  Last saw  pulmonology in October where they discussed possibly starting antifibrotic agents.  Does not have a follow-up currently planned, but I would recommend one.  She is continuing supplemental oxygen 2 L daily as needed, mostly just needs it for exertion.  Continuing Spiriva inhaler daily.  She continues to have difficulty with ambulation due to combination of interstitial lung disease and chronic heart failure with reduced ejection fraction causing severe dyspnea with exertion.  She also has bilateral knee osteoarthritis which makes it hard for her to get up from sitting and transfer.  In August I referred for a electric scooter to help with her independence, help with mobilization, transfers, and improve ability to do activities of daily living.  We are working to get her an evaluation with neuro rehab for that equipment.      Chronic HFrEF (heart failure with reduced ejection fraction) (HCC) - Primary (Chronic)    Recently had some hypervolemia that was treated with daily diuresis over about a 1 week period.  She did well with that, looks more euvolemic today on exam.  Plan to use Lasix only daily as needed at this point.  Will continue with Jardiance 10 mg daily, Entresto 100 mg daily.  Had a bad reaction to low-dose beta-blocker recently, so this is discontinued.  Seems to be well compensated today with an acceptable quality of life.  Return in about 3 months (around 03/30/2023).    [name], MD

## 2022-12-28 NOTE — Assessment & Plan Note (Signed)
Blood pressure is at goal today at 132/81.  There was some confusion she had amlodipine that she was using.  Looks like this was discontinued at hospitalization in September due to her reduced ejection fraction.  Her mail order pharmacy was still sending her refills, we called [facility] and asked them to discontinue the amlodipine prescription.

## 2022-12-29 ENCOUNTER — Ambulatory Visit: Payer: Self-pay

## 2022-12-29 NOTE — Patient Outreach (Signed)
  Care Coordination   Follow Up Visit Note   12/29/2022 Name: Caitlyn Aguirre MRN: [id number] DOB: [birth year]  Caitlyn Aguirre is a 87 y.o. year old female who sees Oswaldo Done, Marquita Palms, MD for primary care. I spoke with  Caitlyn Aguirre by phone today.  What matters to the patients health and wellness today?  Mrs. Goetz is in stable condition and reports no incidence of chest pain; shortness of breath or swelling in her extremities. Her sleep and appetite are satisfactory. Current measurements indicate that her weight is 174 pounds and her blood pressure is 132/81. She had an appointment at the doctor's office yesterday, where she consulted with Dr. Oswaldo Done, who confirmed that her overall health is satisfactory. Mrs. Pondexter raised some inquiries regarding a billing statement she received from [facility]. To address her concerns, a three-way conversation was conducted with the billing department, which clarified that the notice originated from the insurance company and advised her to communicate directly with them for further assistance.    Goals Addressed             This Visit's Progress    Patient goals  monitor and manage CHF       Patient Goals/Self Care Activities: -Patient/Caregiver will take medications as prescribed   -Patient/Caregiver will attend all scheduled provider appointments -Patient/Caregiver will call pharmacy for medication refills 3-7 days in advance of running out of medications -Patient/Caregiver will call provider office for new concerns or questions  -Patient/Caregiver will focus on medication adherence by taking medications as prescribed   Provided education on low sodium diet Reviewed Heart Failure Action Plan in depth  Provided education about placing scale on hard, flat surface Advised patient to weigh each morning  Discussed the importance of keeping all appointments with provider  Wt Readings from Last 3 Encounters:  12/28/22 174 lb (78.9 kg)  12/16/22 180 lb  6.4 oz (81.8 kg)  11/10/22 175 lb (79.4 kg)            SDOH assessments and interventions completed:  No     Care Coordination Interventions:  Yes, provided   Interventions Today    Flowsheet Row Most Recent Value  Chronic Disease   Chronic disease during today's visit Congestive Heart Failure (CHF)  General Interventions   General Interventions Discussed/Reviewed General Interventions Discussed, General Interventions Reviewed  Nutrition Interventions   Nutrition Discussed/Reviewed Nutrition Discussed, Supplemental nutrition  [Giving Ensure samles and coupons]  Pharmacy Interventions   Pharmacy Dicussed/Reviewed Pharmacy Topics Discussed  Safety Interventions   Safety Discussed/Reviewed Safety Discussed        Follow up plan: Follow up call scheduled for 01/13/23  2 pm    Encounter Outcome:  Patient Visit Completed   Juanell Fairly RN, BSN, [facility]  [facility], [facility]  Care Coordinator Phone: [phone]

## 2022-12-29 NOTE — Patient Instructions (Signed)
Visit Information  Thank you for taking time to visit with me today. Please don't hesitate to contact me if I can be of assistance to you.   Following are the goals we discussed today:   Goals Addressed             This Visit's Progress    Patient goals  monitor and manage CHF       Patient Goals/Self Care Activities: -Patient/Caregiver will take medications as prescribed   -Patient/Caregiver will attend all scheduled provider appointments -Patient/Caregiver will call pharmacy for medication refills 3-7 days in advance of running out of medications -Patient/Caregiver will call provider office for new concerns or questions  -Patient/Caregiver will focus on medication adherence by taking medications as prescribed   Provided education on low sodium diet Reviewed Heart Failure Action Plan in depth  Provided education about placing scale on hard, flat surface Advised patient to weigh each morning  Discussed the importance of keeping all appointments with provider  Wt Readings from Last 3 Encounters:  12/28/22 174 lb (78.9 kg)  12/16/22 180 lb 6.4 oz (81.8 kg)  11/10/22 175 lb (79.4 kg)            Our next appointment is by telephone on 01/13/23 at 2 pm  Please call the care guide team at [phone] if you need to cancel or reschedule your appointment.   If you are experiencing a Mental Health or Behavioral Health Crisis or need someone to talk to, please call 1-800-273-TALK (toll free, 24 hour hotline)  Patient verbalizes understanding of instructions and care plan provided today and agrees to view in MyChart. Active MyChart status and patient understanding of how to access instructions and care plan via MyChart confirmed with patient.     Juanell Fairly RN, BSN, [name]  [facility], [facility]  Care Coordinator Phone: [phone]

## 2023-01-06 DIAGNOSIS — J449 Chronic obstructive pulmonary disease, unspecified: Secondary | ICD-10-CM | POA: Diagnosis not present

## 2023-01-06 DIAGNOSIS — G4733 Obstructive sleep apnea (adult) (pediatric): Secondary | ICD-10-CM | POA: Diagnosis not present

## 2023-01-06 DIAGNOSIS — R296 Repeated falls: Secondary | ICD-10-CM | POA: Diagnosis not present

## 2023-01-06 DIAGNOSIS — M79604 Pain in right leg: Secondary | ICD-10-CM | POA: Diagnosis not present

## 2023-01-07 ENCOUNTER — Ambulatory Visit: Payer: 59 | Admitting: Cardiovascular Disease

## 2023-01-08 ENCOUNTER — Telehealth: Payer: Self-pay | Admitting: Student in an Organized Health Care Education/Training Program

## 2023-01-08 NOTE — Telephone Encounter (Signed)
RTC to patient .  Question as to [facility] facilities for a relative. Was told to call the MH fFacilty located on [address]. Patient will forward the message to her family member.

## 2023-01-08 NOTE — Telephone Encounter (Signed)
Pt calling to ask for help with mental health resources.  Please call the patient back.

## 2023-01-13 ENCOUNTER — Ambulatory Visit: Payer: Self-pay

## 2023-01-13 NOTE — Patient Instructions (Signed)
Visit Information  Thank you for taking time to visit with me today. Please don't hesitate to contact me if I can be of assistance to you.   Following are the goals we discussed today:   Goals Addressed             This Visit's Progress    Patient goals  monitor and manage CHF       Patient Goals/Self Care Activities: -Patient/Caregiver will take medications as prescribed   -Patient/Caregiver will attend all scheduled provider appointments -Patient/Caregiver will call pharmacy for medication refills 3-7 days in advance of running out of medications -Patient/Caregiver will call provider office for new concerns or questions  -Patient/Caregiver will focus on medication adherence by taking medications as prescribed   Provided education on low sodium diet Reviewed Heart Failure Action Plan in depth  Provided education about placing scale on hard, flat surface Advised patient to weigh each morning  Discussed the importance of keeping all appointments with provider    Wt Readings from Last 3 Encounters:  12/28/22 174 lb (78.9 kg)  12/16/22 180 lb 6.4 oz (81.8 kg)  11/10/22 175 lb (79.4 kg)  Continue to monitor your body signals          Our next appointment is by telephone on 03/03/23 at 2 pm  Please call the care guide team at [phone] if you need to cancel or reschedule your appointment.   If you are experiencing a Mental Health or Behavioral Health Crisis or need someone to talk to, please call 1-800-273-TALK (toll free, 24 hour hotline)  Patient verbalizes understanding of instructions and care plan provided today and agrees to view in MyChart. Active MyChart status and patient understanding of how to access instructions and care plan via MyChart confirmed with patient.     Juanell Fairly RN, BSN, [facility] [place]  [facility], [facility]  Care Coordinator Phone: [phone]

## 2023-01-13 NOTE — Patient Outreach (Signed)
  Care Coordination   Follow Up Visit Note   01/13/2023 Name: Caitlyn Aguirre MRN: [id number] DOB: 29-Nov-1935  Caitlyn Aguirre is a 87 y.o. year old female who sees Oswaldo Done, Marquita Palms, MD for primary care. I spoke with  Caitlyn Aguirre by phone today.  What matters to the patients health and wellness today?  Caitlyn Aguirre is currently in stable condition, with her weight recorded at 176 pounds. She reports no instances of shortness of breath, chest pain, or swelling. However, she is experiencing a cough accompanied by mucus production. She expressed that the medication she has been taking does not appear to provide relief. I recommended that she try warm tea to aid in loosening the phlegm and to continue expectorating it. Should the cough persist, I advised her to consult her physician to explore potential additional medication options that may be effective in alleviating her symptoms.     Goals Addressed             This Visit's Progress    Patient goals  monitor and manage CHF       Patient Goals/Self Care Activities: -Patient/Caregiver will take medications as prescribed   -Patient/Caregiver will attend all scheduled provider appointments -Patient/Caregiver will call pharmacy for medication refills 3-7 days in advance of running out of medications -Patient/Caregiver will call provider office for new concerns or questions  -Patient/Caregiver will focus on medication adherence by taking medications as prescribed   Provided education on low sodium diet Reviewed Heart Failure Action Plan in depth  Provided education about placing scale on hard, flat surface Advised patient to weigh each morning  Discussed the importance of keeping all appointments with provider    Wt Readings from Last 3 Encounters:  12/28/22 174 lb (78.9 kg)  12/16/22 180 lb 6.4 oz (81.8 kg)  11/10/22 175 lb (79.4 kg)  Continue to monitor your body signals          SDOH assessments and interventions  completed:  No     Care Coordination Interventions:  Yes, provided   Interventions Today    Flowsheet Row Most Recent Value  Chronic Disease   Chronic disease during today's visit Congestive Heart Failure (CHF)  General Interventions   General Interventions Discussed/Reviewed General Interventions Discussed, General Interventions Reviewed  Nutrition Interventions   Nutrition Discussed/Reviewed Nutrition Discussed  Pharmacy Interventions   Pharmacy Dicussed/Reviewed Pharmacy Topics Discussed  Safety Interventions   Safety Discussed/Reviewed Safety Discussed        Follow up plan: Follow up call scheduled for 03/03/23  2 pm    Encounter Outcome:  Patient Visit Completed   Juanell Fairly RN, BSN, [facility]  [facility], [facility]  Care Coordinator Phone: [phone]

## 2023-01-14 ENCOUNTER — Other Ambulatory Visit: Payer: Self-pay

## 2023-01-14 DIAGNOSIS — R109 Unspecified abdominal pain: Secondary | ICD-10-CM

## 2023-01-14 MED ORDER — ATORVASTATIN CALCIUM 40 MG PO TABS: 40.0000 mg | ORAL_TABLET | Freq: Every day | ORAL | 3 refills | Status: DC

## 2023-01-15 NOTE — Progress Notes (Unsigned)
Cardiology Office Note:    Date:  01/21/2023   ID:  Caitlyn Aguirre, DOB 1935-08-15, MRN [id number]  PCP:  [name], MD  Cardiologist:  Thomasene Ripple, DO     Referring MD: [name],*   Chief Complaint: follow-up of CHF  History of Present Illness:    Caitlyn Aguirre is a 87 y.o. female with a history of normal coronaries on remote cardiac catheterization in 1999 and 2010, chronic HFrEF with EF 30-35% on Echo in 10/2022, COPD and fibrotic lung disease with chronic hypoxic respiratory failure on 2 L of O2 at home, CVA, hypertension, hyperlipidemia, type 2 diabetes mellitus, hypothyroidism, chronic anemia, and PMR  who is followed by Dr. Servando Salina and presents today for follow-up of CHF.   She has a history of a MI in 1999; however, cardiac catheterization at that time showed normal coronaries.  EF at that time was 35-40% but has subsequently normalized.  He reportedly had another cardiac catheterization in 2010 which showed no significant CAD.  Last ischemic evaluation was a Myoview in 10/2014 which was low risk and showed no reversible ischemia.  Patient has not been seen by cardiology since 2016.  Last Echo in 05/2021 showed LVEF of 55-60% with abnormal septal motion but no regional wall motion abnormalities, mild LVH, and grade 1 diastolic function.  She also has COPD and is on 2 L of O2 at home. Recent High-resolution CT in 08/2022 showed pulmonary parenchymal pattern of fibrosis and she was referred to Pulmonology.  She was seen by Dr. Marchelle Gearing earlier this week on 10/06/2018 and spirometry and DLCO, Echo, and blood work were ordered.  She was also referred to back Cardiology for possible right heart catheterization.    However, she was then admitted from 10/08/2022 to 10/15/2022 for acute on chronic CHF after presenting with worsening shortness of breath initially requiring BiPAP.  High-sensitivity troponin was negative. BNP was elevated in the 500s. Chest x-ray showed emphysema  and chronic fibrotic lung disease but no acute findings. Echo showed LVEF of 30-35% with global hypokinesis and grade 1 diastolic dysfunction. She was diuresed with IV Lasix and GDMT was adjusted. Initial plan was for [facility]; however, given renal function only a right cardiac catheterization was performed and showed normal left heart filling pressures, mildly elevated right heart filling pressures, moderate pulmonary hypertension, normal Fick cardiac output, and moderately reduced thermodilution cardiac output. She was felt to be a poor candidate for additional invasive or interventional procedures. Continue medical therapy was recommended.    Echo on 10/20/2022 (the one ordered by Pulmonology) showed LVEF of 25-30% with global hypokinesis and grade 2 diastolic dysfunction, moderately enlarged RV with moderately reduced RV function, mild MR,  and mild to moderate TR.  [facility]2022 at which time she reported chronic dyspnea on exertion and dizziness with standing and long periods of ambulation but no syncope. She was noted to be in sinus tachycardia with rate of 111 bpm. This was felt to likely be due to over-diuresis but urinalysis was ordered to rule out infection which was negative. Jardiance was stopped and she was advised to only take Lasix as needed.   She was last seen by me on 11/03/2022 at which time she was doing better since recent hospitalization. She has chronic dyspnea due to her underlying lung disease but this was stable. Her weight was up in the office but stable on home scale. She was still mildly  tachycardia with rates of 103 but it was in the 50s the day before at her PCPs office. Monitor was ordered and showed underlying sinus rhythm with average heart rate of 99 bpm (min 67 bpm, max 250 bpm) and 1,385 runs of NSVT (longest run 9 beats and max rate 250 bpm) and frequent PVCs (burden 13.5%). She was started on low dose Toprol-XL but was unable to tolerate  this due to weakness/ near syncope.   She was seen by her PCP in 12/2022 and noted to be volume overloaded so Lasix was increased to daily use for 1 week and then went back to PRN dosing.   Patient presents today for follow-up. Here with her daughter. She is overall stable from a cardiac standpoint. She has chronic dyspnea on exertion but this is stable. She is on 2L of O2 at night and as needed during the day when ambulating but this is unchanged. She denies any shortness of breath at rest. She has chronic but stable orthopnea but no edema. She has occasional lower extremity, especially if her feet have been down all day but it improves with elevating her legs. Weights are stable at home. Baseline weight is around 176 lbs on home scales. She weighs 180 lbs on office scales today but did not weigh herself at home this morning. She describes some very atypical chest pain at rest that only last a couple of seconds and then resolves on its own. No exertional chest pain. Nothing that sounds like angina. She reports some heart racing and dizziness when she is acutely short of breath but nothing outside these times. No syncope. Daughter reports her legs/ feet are often very cold. However, she denies any claudication, skin discoloration, or slow healing wounds. She has an intermittent cough. She also reports a lot of arthritic pain in multiple joints.   EKGs/Labs/Other Studies Reviewed:    The following studies were reviewed:  Myoview 10/31/2014: Nuclear stress EF: 55%. There was no ST segment deviation noted during stress. No T wave inversion was noted during stress. Defect 1: There is a medium defect of mild severity present in the mid anterolateral and apical lateral location. Findings consistent with prior myocardial infarction. This is a low risk study.   Low risk stress nuclear study with a medium sized area of mild anterolateral scar, no reversible ischemia and normal left ventricular regional and  global systolic function. _______________   Echocardiogram 05/31/2021: Impressions: 1. Abnormal septal motion . Left ventricular ejection fraction, by  estimation, is 55 to 60%. The left ventricle has normal function. The left  ventricle has no regional wall motion abnormalities. There is mild left  ventricular hypertrophy. Left  ventricular diastolic parameters are consistent with Grade I diastolic  dysfunction (impaired relaxation).   2. Right ventricular systolic function is normal. The right ventricular  size is normal. There is normal pulmonary artery systolic pressure.   3. Left atrial size was mildly dilated.   4. The mitral valve is abnormal. Trivial mitral valve regurgitation. No  evidence of mitral stenosis.   5. The aortic valve is tricuspid. There is mild calcification of the  aortic valve. There is mild thickening of the aortic valve. Aortic valve  regurgitation is not visualized. Aortic valve sclerosis is present, with  no evidence of aortic valve stenosis.   6. Pulmonic valve regurgitation is moderate.   7. The inferior vena cava is normal in size with greater than 50%  respiratory variability, suggesting right atrial pressure of 3  mmHg.   8. Cannot exclude a small PFO.  _______________   Echocardiogram 10/09/2022: Impressions: 1. Left ventricular ejection fraction, by estimation, is 30 to 35%. The  left ventricle has moderately decreased function. The left ventricle  demonstrates global hypokinesis. Left ventricular diastolic parameters are  consistent with Grade I diastolic  dysfunction (impaired relaxation). There is the interventricular septum is  flattened in systole and diastole, consistent with right ventricular  pressure and volume overload.   2. Right ventricular systolic function is mildly reduced. The right  ventricular size is normal. There is moderately elevated pulmonary artery  systolic pressure. The estimated right ventricular systolic pressure is   47.4 mmHg.   3. The mitral valve is abnormal. Trivial mitral valve regurgitation.   4. The tricuspid valve is abnormal. Tricuspid valve regurgitation is mild  to moderate.   5. The aortic valve is tricuspid. Aortic valve regurgitation is not  visualized.   6. The pulmonic valve was abnormal.   7. The inferior vena cava is normal in size with <50% respiratory  variability, suggesting right atrial pressure of 8 mmHg.  _______________   Right Cardiac Catheterization 10/12/2022: Conclusions: Normal left heart filling pressure. Mildly elevated right heart filling pressure. Moderate pulmonary hypertension. Normal Fick cardiac output. Moderately reduced thermodilution cardiac output.   Recommendations: Continue to optimize goal-directed medical therapy for HFrEF. _______________   Echocardiogram 10/20/2022: Impressions: 1. Left ventricular ejection fraction, by estimation, is 25 to 30%. The  left ventricle has severely decreased function. The left ventricle  demonstrates global hypokinesis. Left ventricular diastolic parameters are  consistent with Grade II diastolic  dysfunction (pseudonormalization). Elevated left ventricular end-diastolic  pressure.   2. Right ventricular systolic function is moderately reduced. The right  ventricular size is moderately enlarged.   3. The mitral valve is normal in structure. Mild mitral valve  regurgitation. No evidence of mitral stenosis.   4. The tricuspid valve is abnormal. Tricuspid valve regurgitation is mild  to moderate.   5. The aortic valve is normal in structure. Aortic valve regurgitation is  mild. Aortic valve sclerosis is present, with no evidence of aortic valve  stenosis.   6. The pulmonic valve was abnormal. Pulmonic valve regurgitation is  moderate to severe.   7. The inferior vena cava is normal in size with greater than 50%  respiratory variability, suggesting right atrial pressure of 3 mmHg.  _______________  Monitor  11/09/2022 to 11/23/2022: Patient had a min HR of 67 bpm, max HR of 250 bpm, and avg HR of 99 bpm. Predominant underlying rhythm was Sinus Rhythm.    1385 Ventricular Tachycardia runs occurred, the run with the fastest interval lasting 6 beats with a max rate of 250 bpm, the longest  lasting 9 beats with an avg rate of 114 bpm. Isolated SVEs were rare (<1.0%), SVE Couplets were rare (<1.0%), and SVE Triplets were rare (<1.0%). Isolated VEs were frequent (13.5%, [id number]), VE Couplets were occasional (3.8%, 36566), and VE Triplets were  rare (<1.0%, 4987). Ventricular Bigeminy and Trigeminy were present.    Symptoms associated with premature ventricular complexes.   Conclusion: This study showed evidence of the following: 1.  Nonsustained ventricular tachycardia 2.  Frequent premature ventricular complexes (13.5%, [id number])  EKG:  EKG not ordered today.   Recent Labs: 10/06/2022: Pro B Natriuretic peptide (BNP) 388.0 10/08/2022: ALT 17 10/09/2022: TSH 1.491 10/27/2022: Hemoglobin 12.1; Platelets 272 11/02/2022: BUN 21; Creatinine, Ser 1.58; Potassium 4.7; Sodium 139 11/03/2022: Magnesium 1.6 12/16/2022: B Natriuretic Peptide 2,704.7  Recent Lipid Panel    Component Value Date/Time   CHOL 140 07/27/2022 0949   TRIG 81 07/27/2022 0949   HDL 59 07/27/2022 0949   CHOLHDL 2.4 07/27/2022 0949   CHOLHDL 3.2 05/31/2021 0438   VLDL 14 05/31/2021 0438   LDLCALC 65 07/27/2022 0949    Physical Exam:    Vital Signs: BP (!) 122/50 (BP Location: Left Arm, Patient Position: Sitting, Cuff Size: Normal)   Pulse 86   Ht 5\' 8"  (1.727 m)   Wt 180 lb (81.6 kg)   BMI 27.37 kg/m     Wt Readings from Last 3 Encounters:  01/21/23 180 lb (81.6 kg)  12/28/22 174 lb (78.9 kg)  12/16/22 180 lb 6.4 oz (81.8 kg)     General: 87 y.o. overweight African-American female in no acute distress. HEENT: Normocephalic and atraumatic. Sclera clear.  Neck: Supple. No carotid bruits. No JVD. Heart: RRR. Distinct S1 and S2.  No murmurs, gallops, or rubs.  Lungs: No increased work of breathing. Clear to ausculation bilaterally. No wheezes, rhonchi, or rales.  Abdomen: Soft, non-distended, and non-tender to palpation.  Extremities: Trace lower extremity edema. Good posterior tibial and distal pedal pulses bilaterally.  Skin: Warm and dry. Neuro: No focal deficits. Psych: Normal affect. Responds appropriately.   Assessment:    1. Chronic HFrEF (heart failure with reduced ejection fraction) (HCC)   2. NSVT (nonsustained ventricular tachycardia) (HCC)   3. Frequent PVCs   4. Pulmonary hypertension, unspecified (HCC)   5. Primary hypertension   6. Hyperlipidemia, unspecified hyperlipidemia type   7. Type 2 diabetes mellitus with complication, without long-term current use of insulin (HCC)   8. Stage 3 chronic kidney disease, unspecified whether stage 3a or 3b CKD (HCC)   9. Chronic obstructive pulmonary disease, unspecified COPD type (HCC)   10. Idiopathic pulmonary fibrosis (HCC)   11. Sensation of cold in lower extremity     Plan:    Chronic HFrEF Patient has a long history of chronic systolic CHF dating back to at 73 when EF was 35-40%. LHC in 1999 and 2010 showed normal coronaries. EF later normalized. Echo in 05/2021 showed LVEF of 55-60%. She was admitted for acute CHF in 10/2022 after presenting with progressive shortness of breath. Echo on 10/09/2022 during admission showed LVEF of 30-35% with global hypokinesis and grade 1 diastolic dysfunction. She was diuresed and GDMT was optimized. Initial plan was for [facility]; however, given AKI only a right cardiac catheterization was performed and showed normal left heart filling pressures, mildly elevated right heart filling pressures, moderate pulmonary hypertension, normal Fick cardiac output, and moderately reduced thermodilution cardiac output. She was felt to be a poor candidate for additional invasive or interventional procedures. Outpatient Echo on 10/20/2022  showed LVEF of 25-30% with global hypokinesis and grade 2 diastolic dysfunction, moderately enlarged RV with moderately reduced RV function, mild MR,  and mild to moderate TR. - PCP recently increased Lasix from PRN use to daily use for 1 week last month due to signs of volume overloaded. BNP was 2,704 at that time. - Euvolemic on exam. Weight slightly up at 180 lbs on office scales today but stable on home scales. Baseline weight around 176 lbs on home scales.  - Continue Lasix 40mg  as needed for weight gain and edema. Advised patient to take a dose of Lasix if weight 180 lbs on home scales tomorrow morning.  - Continue Entresto 49-51mg  twice daily.  - Unable to tolerate beta-blockers in the past.  -  Previously on Jardiance but this was recently stopped due to concerns for over-diuresis and dizziness with standing.  - Discussed the importance of daily weight and sodium/ fluid restrictions.  - Will recheck BNP and BMET today.   Non-Sustained VT Frequent PVCs Monitor in 11/2022 showed underlying sinus rhythm with 1,385 short runs of NSVT (longest run 9 beats) and frequent PVCs (13.5% burden).  - She reports some heart racing when she is acutely short of breath but no other palpitations.  - Will check Magnesium and Potassium levels today. Patient has Magnesium Oxide listed under home medications but she does not know if she is taking it or not.  - Unfortunately treatment options are limited. She has been unable to tolerate low dose beta-blockers (including Bisoprolol and Toprol-XL) due to weakness and near syncope. Calcium channel blockers contraindicated due to reduced EF. Unable to use Amiodarone given pulmonary fibrosis. Do not know if she would be a candidate for ablation given advanced age and severe pulmonary disease. Will review with EP.    Pulmonary Hypertension WHO Group 2 and 3.  - Continue management of CHF as above.  - Otherwise, management per Pulmonology.   Hypertension BP well  controlled.  - Continue  Entresto as above.   Hyperlipidemia Lipid panel in 07/2022: Total Cholesterol 140, Triglycerides 81, HDL 59, LDL 65.  - Continue Lipitor 40mg  daily.   Type 2 Diabetes Mellitus Hemoglobin A1c 6.6 in 07/2022. - On Metformin and Jardiance.   CKD Stage III Baseline creatinine around 1.3 to 1.6.  Stable at 1.58 on labs at [facility] office in 10/2022.    COPD on Home O2 Idiopathic Pulmonary Fibrosis Patient has a history of COPD on home O2 and was also recently diagnosed with idiopathic pulmonary fibrosis. Recent RHC showed moderate pulmonary hypertension with PCWP of 10 mmHg.  - Stable. On 2L of O2 at night and as needed during the day. - Management per Pulmonology.  Cool Extremities Daughter reports legs/ feet are cold at times. She has good posterior and tibial pulses and denies any claudication, skin discoloration, or slow healing wounds. Does not sound like PAD. Offered to check ABIs and lower extremity arterial ultrasounds but patient/ family declined.   Disposition: Follow up in 3-4 months.    Signed, Corrin Parker, PA-C  01/21/2023 10:17 PM    [facility]

## 2023-01-21 ENCOUNTER — Ambulatory Visit: Payer: 59 | Attending: Student | Admitting: Student

## 2023-01-21 ENCOUNTER — Encounter: Payer: Self-pay | Admitting: Student

## 2023-01-21 VITALS — BP 122/50 | HR 86 | Ht 68.0 in | Wt 180.0 lb

## 2023-01-21 DIAGNOSIS — E785 Hyperlipidemia, unspecified: Secondary | ICD-10-CM | POA: Diagnosis not present

## 2023-01-21 DIAGNOSIS — I493 Ventricular premature depolarization: Secondary | ICD-10-CM | POA: Diagnosis not present

## 2023-01-21 DIAGNOSIS — E118 Type 2 diabetes mellitus with unspecified complications: Secondary | ICD-10-CM

## 2023-01-21 DIAGNOSIS — J449 Chronic obstructive pulmonary disease, unspecified: Secondary | ICD-10-CM | POA: Diagnosis not present

## 2023-01-21 DIAGNOSIS — N183 Chronic kidney disease, stage 3 unspecified: Secondary | ICD-10-CM

## 2023-01-21 DIAGNOSIS — I5022 Chronic systolic (congestive) heart failure: Secondary | ICD-10-CM | POA: Diagnosis not present

## 2023-01-21 DIAGNOSIS — R209 Unspecified disturbances of skin sensation: Secondary | ICD-10-CM | POA: Diagnosis not present

## 2023-01-21 DIAGNOSIS — I4729 Other ventricular tachycardia: Secondary | ICD-10-CM

## 2023-01-21 DIAGNOSIS — I272 Pulmonary hypertension, unspecified: Secondary | ICD-10-CM

## 2023-01-21 DIAGNOSIS — J84112 Idiopathic pulmonary fibrosis: Secondary | ICD-10-CM | POA: Diagnosis not present

## 2023-01-21 DIAGNOSIS — I10 Essential (primary) hypertension: Secondary | ICD-10-CM | POA: Diagnosis not present

## 2023-01-21 NOTE — Patient Instructions (Signed)
    Follow-Up: At [facility], you and your health needs are our priority.  As part of our continuing mission to provide you with exceptional heart care, we have created designated Provider Care Teams.  These Care Teams include your primary Cardiologist (physician) and Advanced Practice Providers (APPs -  Physician Assistants and Nurse Practitioners) who all work together to provide you with the care you need, when you need it.  We recommend signing up for the patient portal called "MyChart".  Sign up information is provided on this After Visit Summary.  MyChart is used to connect with patients for Virtual Visits (Telemedicine).  Patients are able to view lab/test results, encounter notes, upcoming appointments, etc.  Non-urgent messages can be sent to your provider as well.   To learn more about what you can do with MyChart, go to [email or web address].    Your next appointment:   3 month(s)  Provider:   Marjie Skiff or Dr Servando Salina

## 2023-01-22 LAB — BASIC METABOLIC PANEL
BUN/Creatinine Ratio: 13 (ref 12–28)
BUN: 17 mg/dL (ref 8–27)
CO2: 16 mmol/L — ABNORMAL LOW (ref 20–29)
Calcium: 10 mg/dL (ref 8.7–10.3)
Chloride: 110 mmol/L — ABNORMAL HIGH (ref 96–106)
Creatinine, Ser: 1.34 mg/dL — ABNORMAL HIGH (ref 0.57–1.00)
Glucose: 124 mg/dL — ABNORMAL HIGH (ref 70–99)
Potassium: 4.5 mmol/L (ref 3.5–5.2)
Sodium: 146 mmol/L — ABNORMAL HIGH (ref 134–144)
eGFR: 39 mL/min/{1.73_m2} — ABNORMAL LOW (ref >59)

## 2023-01-22 LAB — MAGNESIUM: Magnesium: 1.7 mg/dL (ref 1.6–2.3)

## 2023-01-22 LAB — PRO B NATRIURETIC PEPTIDE: NT-Pro BNP: 2078 pg/mL — ABNORMAL HIGH (ref 0–738)

## 2023-01-26 ENCOUNTER — Other Ambulatory Visit: Payer: Self-pay

## 2023-01-26 ENCOUNTER — Other Ambulatory Visit: Payer: Self-pay | Admitting: Student in an Organized Health Care Education/Training Program

## 2023-01-26 DIAGNOSIS — I5022 Chronic systolic (congestive) heart failure: Secondary | ICD-10-CM

## 2023-01-28 ENCOUNTER — Other Ambulatory Visit: Payer: Self-pay | Admitting: Student

## 2023-01-28 MED ORDER — FUROSEMIDE 40 MG PO TABS: 40.0000 mg | ORAL_TABLET | Freq: Every day | ORAL | 0 refills | Status: DC | PRN

## 2023-01-29 ENCOUNTER — Other Ambulatory Visit: Payer: Self-pay

## 2023-01-29 DIAGNOSIS — I5022 Chronic systolic (congestive) heart failure: Secondary | ICD-10-CM | POA: Diagnosis not present

## 2023-01-29 DIAGNOSIS — G4733 Obstructive sleep apnea (adult) (pediatric): Secondary | ICD-10-CM | POA: Diagnosis not present

## 2023-01-29 DIAGNOSIS — J449 Chronic obstructive pulmonary disease, unspecified: Secondary | ICD-10-CM | POA: Diagnosis not present

## 2023-01-29 DIAGNOSIS — M79604 Pain in right leg: Secondary | ICD-10-CM | POA: Diagnosis not present

## 2023-01-29 DIAGNOSIS — R296 Repeated falls: Secondary | ICD-10-CM | POA: Diagnosis not present

## 2023-01-29 NOTE — Telephone Encounter (Signed)
Refill has been addressed,medication was refilled 01/28/23.

## 2023-01-30 LAB — BASIC METABOLIC PANEL
BUN/Creatinine Ratio: 12 (ref 12–28)
BUN: 17 mg/dL (ref 8–27)
CO2: 18 mmol/L — ABNORMAL LOW (ref 20–29)
Calcium: 10.6 mg/dL — ABNORMAL HIGH (ref 8.7–10.3)
Chloride: 109 mmol/L — ABNORMAL HIGH (ref 96–106)
Creatinine, Ser: 1.42 mg/dL — ABNORMAL HIGH (ref 0.57–1.00)
Glucose: 164 mg/dL — ABNORMAL HIGH (ref 70–99)
Potassium: 4.7 mmol/L (ref 3.5–5.2)
Sodium: 145 mmol/L — ABNORMAL HIGH (ref 134–144)
eGFR: 36 mL/min/{1.73_m2} — ABNORMAL LOW (ref >59)

## 2023-02-02 ENCOUNTER — Encounter: Payer: Self-pay | Admitting: *Deleted

## 2023-02-06 DIAGNOSIS — G4733 Obstructive sleep apnea (adult) (pediatric): Secondary | ICD-10-CM | POA: Diagnosis not present

## 2023-02-06 DIAGNOSIS — J449 Chronic obstructive pulmonary disease, unspecified: Secondary | ICD-10-CM | POA: Diagnosis not present

## 2023-02-06 DIAGNOSIS — M79604 Pain in right leg: Secondary | ICD-10-CM | POA: Diagnosis not present

## 2023-02-06 DIAGNOSIS — R296 Repeated falls: Secondary | ICD-10-CM | POA: Diagnosis not present

## 2023-02-25 ENCOUNTER — Encounter: Payer: Self-pay | Admitting: *Deleted

## 2023-03-01 DIAGNOSIS — M79604 Pain in right leg: Secondary | ICD-10-CM | POA: Diagnosis not present

## 2023-03-01 DIAGNOSIS — G4733 Obstructive sleep apnea (adult) (pediatric): Secondary | ICD-10-CM | POA: Diagnosis not present

## 2023-03-01 DIAGNOSIS — R296 Repeated falls: Secondary | ICD-10-CM | POA: Diagnosis not present

## 2023-03-01 DIAGNOSIS — J449 Chronic obstructive pulmonary disease, unspecified: Secondary | ICD-10-CM | POA: Diagnosis not present

## 2023-03-02 ENCOUNTER — Ambulatory Visit: Payer: 59 | Attending: Student in an Organized Health Care Education/Training Program | Admitting: Physical Therapy

## 2023-03-02 DIAGNOSIS — R262 Difficulty in walking, not elsewhere classified: Secondary | ICD-10-CM | POA: Insufficient documentation

## 2023-03-02 DIAGNOSIS — R2681 Unsteadiness on feet: Secondary | ICD-10-CM | POA: Insufficient documentation

## 2023-03-02 DIAGNOSIS — M6281 Muscle weakness (generalized): Secondary | ICD-10-CM | POA: Insufficient documentation

## 2023-03-02 NOTE — Therapy (Signed)
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 legrest preventing knees from flexing  []  Increase ground clearance over      curbs  []   STD (pair) independently                     elevate legrest   POWER WHEELCHAIR CONTROLS      Controls/input device  []  Expandable  [x]  Non-expandable  [x]  Proportional  [x]  Right Hand []  Left Hand  []  Non-proportional/switches/head-array  []  Electrical/proximity         []   Mechanical      Manufacturer:___________________   Type:_____Joystick___________________ [x]  provides access for controlling wheelchair  [x]  programming for accurate control  []  progressive disease/changing condition  []  required for alternative drive      controls       []  lacks motor control to operate  proportional drive control  []  unable to understand proportional controls  []  limited movement/strength  []  extraneous movement / tremors / ataxic / spastic       []  Upgraded electronics controller/harness    []  Single power (tilt or recline)   []  Expandable    []  Non-expandable plus   []  Multi-power (tilt, recline, power legrest, power seat lift, vertical positioning system, stand)  []  allows input device to communicate with drive motors  []  harness provides necessary  connections between the controller, input device, and seat functions     []  needed in order to operate power seat functions through joystick/ input device  []  required for alternative drive controls     []  Enhanced display  []  required to connect all alternative drive controls   []  required for upgraded joystick      (lite-throw, heavy duty, micro)  []  Allows user to see in which mode and drive the wheelchair is set; necessary for alternate controls       []   Upgraded tracking electronics  []  correct tracking when on uneven surfaces makes switch driving more efficient and less fatiguing  []  increase safety when driving  []  increase ability to traverse thresholds    []  Safety / reset / mode switches     Type:    []  Used to change modes and stop the wheelchair when driving     [x]  Mount for joystick / input device/switches  [x]  swing away for access or transfers   [x]  attaches joystick / input device / switches to wheelchair   []  provides for consistent access  []  midline for optimal placement    []  Attendant controlled joystick plus     mount  []  safety  []  long distance driving  []  operation of seat functions  []  compliance with transportation regulations    [x]  Battery  [x]  required to power (power assist / scooter/ power wc / other):   []  Power inverter (24V to 12V)  []  required for ventilator / respiratory equipment / other:     CHAIR OPTIONS MANUAL & POWER      Armrests   [x]  adjustable height []  removable  []  swing away []  fixed  [x]  flip back  []  reclining  [x]  full length pads []  desk []  tube arms []  gel pads  [x]  provide support with elbow at 90    [x]  remove/flip back/swing away for  transfers  [x]  provide support and positioning of upper body    [x]  allow to come closer to table top  [x]  remove for access to tables  []  provide support for w/c tray  [x]  change of height/angles for variable activities   []  Elbow support / Elbow stop  []  keep elbow positioned on arm pad  []   keep arms from falling off arm pad  during tilt and/or recline   Upper Extremity Support  []  Arm trough  []   R  []   L  Style:  []  swivel mount []  fixed mount   []  posterior hand support  []   tray  []  full tray  []  joystick cut out  []   R  []   L  Style:  []  decrease gravitational pull on      shoulders  []  provide support to increase UE  function  []  provide hand support in natural    position  []  position flaccid UE  []  decrease subluxation    []  decrease edema       []  manage spasticity   []  provide midline positioning  []  provide work surface  []  placement for AAC/ Computer/ EADL       Hangers/ Legrests   []  ______ degree  []  Elevating []  articulating  []  swing away []  fixed []  lift off  []  heavy duty  []  adjustable knee angle  []  adjustable calf panel   []  longer extension tube              []  provide LE support  []  maintain placement of feet on      footplate   []  accommodate lower leg length  []  accommodate to hamstring       tightness  []  enable transfers  []  provide change in position for LE's  []  elevate legs during recline    []  decrease edema  []  durability      Foot support   [x]  footplate []  R []  L [x]  flip up           [x]  Depth adjustable   [x]  angle  adjustable  []  foot board/one piece    [x]  provide foot support  []  accommodate to ankle ROM  [x]  allow foot to go under wheelchair base  [x]  enable transfers     []  Shoe holders  []  position foot    []  decrease / manage spasticity  []  control position of LE  []  stability    []  safety     []  Ankle strap/heel      loops  []  support foot on foot support  []  decrease extraneous movement  []  provide input to heel   []  protect foot     []  Amputee adapter []  R  []  L     Style:                  Size:  []  Provide support for stump/residual extremity    []  Transportation tie-down  []  to provide crash tested tie-down brackets    []  Crutch/cane holder    []  O2 holder    []  IV hanger   []  Ventilator  tray/mount    []  stabilize accessory on wheelchair       Component  Justification     [x]  Seat cushion - Captain Seat     []  accommodate impaired sensation  []  decubitus ulcers present or history  []  unable to shift weight  []  increase pressure distribution  []  prevent pelvic extension  []  custom required "off-the-shelf"    seat cushion will not accommodate deformity  [x]  stabilize/promote pelvis alignment  []  stabilize/promote femur alignment  []  accommodate obliquity  []  accommodate multiple deformity  []  incontinent/accidents  []  low maintenance     []  seat mounts                 []  fixed []  removable  []  attach seat platform/cushion to wheelchair frame    []  Seat wedge    []  provide increased aggressiveness of seat shape to decrease sliding  down in the seat  []  accommodate ROM        []  Cover replacement   []  protect back or seat cushion  []  incontinent/accidents    []  Solid seat / insert    []  support cushion to prevent      hammocking  []  allows attachment of cushion to mobility base    []  Lateral pelvic/thigh/hip     support (Guides)     []  decrease abduction  []  accommodate pelvis  []  position upper legs  []  accommodate spasticity  []  removable for transfers     []  Lateral pelvic/thigh      supports mounts  []  fixed   []  swing-away   []  removable  []  mounts lateral pelvic/thigh supports     []  mounts lateral pelvic/thigh supports swing-away or removable for transfers    []  Medial thigh support ([place])  [] decrease adduction  [] accommodate ROM  []  remove for transfers   []  alignment      []  Medial thigh   []  fixed      support mounts      []  swing-away   []  removable  []  mounts medial thigh supports   []  Mounts medial supports swing- away or removable for transfers       Component  Justification   []  Back       []  provide posterior trunk support []  facilitate tone  []  provide lumbar/sacral support []  accommodate deformity  []  support trunk in midline   []  custom  required "off-the-shelf" back support will not accommodate  deformity   []  provide lateral trunk support []  accommodate or decrease tone            []  Back mounts  []  fixed  []  removable  []  attach back rest/cushion to wheelchair frame   []  Lateral trunk      supports  []  R []  L  []  decrease lateral trunk leaning  []  accommodate asymmetry    []  contour for increased contact  []  safety    []  control of tone    []  Lateral trunk      supports mounts  []  fixed  []  swing-away   []  removable  []  mounts lateral trunk supports     []  Mounts lateral trunk supports swing-away or removable for transfers   []  Anterior chest      strap, vest     []  decrease forward movement of shoulder  []  decrease forward movement of trunk  []  safety/stability  []  added abdominal support  []  trunk alignment  []  assistance with shoulder control   []  decrease shoulder elevation    []  Headrest      []  provide posterior head support  []  provide posterior neck support  []  provide lateral head support  []  provide anterior head support  []  support during tilt and recline  []  improve feeding     []  improve respiration  []  placement of switches  []  safety    []  accommodate ROM   []  accommodate tone  []  improve visual orientation   []  Headrest           []  fixed []  removable []  flip down      Mounting hardware   []  swing-away laterals/switches  []  mount headrest   []  mounts headrest flip down or  removable for transfers  []  mount headrest swing-away laterals   []  mount switches     []  Neck Support    []  decrease neck rotation  []  decrease forward neck flexion   Pelvic Positioner    []  std hip belt          []  padded hip belt  []  dual pull hip belt  []  four point hip belt  []  stabilize tone  []  decrease falling out of chair  []  prevent excessive extension  []  special pull angle to control      rotation  []  pad for protection over boney   prominence  []  promote comfort    []  Essential needs         bag/pouch   []  medicines []  special food rorthotics []  clothing changes  []  diapers  []  catheter/hygiene []  ostomy supplies   The above equipment has a life- long use expectancy.  Growth and changes in medical and/or functional conditions would be the exceptions.    SUMMARY:  Why mobility device was selected; include why a lower level device is not appropriate:   ASSESSMENT:  CLINICAL IMPRESSION: Patient is a 88 y.o. female who was seen today for physical therapy evaluation for a seating and power mobility device. Reports PMH of ILD, polymaglia rheumatoid arthritis, CHFrEF, need for 2L of supplimental oxygen as needed, MI, and stroke. Patient arrives to session in transport chair and reports using a 2WW at home; however, current form of mobility completely inadequate for meeting patient's current mobility needs. Patient is a high risk for falls as indicated by TUG score of 71 seconds. Furthermore, patient quickly desats from 96% to 91% over this short distance even while on 1.5 L of oxygen.  Patient extensive cardiac and pulmonary history including interstitial lung disease, MI, and heart failure, as well as UE ROM and pain limitations related to rheumatoid arthritis make cane, walker, crutches, and manual chair, and manual chair with power boost inappropriate options for patient. A scooter is not indicated as patient needs mobility device for in home use and scooter does not allow for close enough approximation to surfaces for transfers or navigation in home environment as turn radius much larger than power wheelchair. For this reason, this physical therapist determines that the [employer] power wheelchair is a lifetime medical necessity and need as it allows patient to safely move around home, decreasing falls and desat risk thus decreasing risk for future hospitalizations.   OBJECTIVE IMPAIRMENTS Abnormal gait, decreased balance, difficulty walking, decreased ROM, decreased strength, impaired  sensation, and pain.   ACTIVITY LIMITATIONS carrying, bending, standing, squatting, transfers, locomotion level, and caring for others  PARTICIPATION LIMITATIONS: meal prep, cleaning, laundry, and community activity  PERSONAL FACTORS Age, Time since onset of injury/illness/exacerbation, and 3+ comorbidities: see above  are also affecting patient's functional outcome.   REHAB POTENTIAL: Good  CLINICAL DECISION MAKING: Stable/uncomplicated  EVALUATION COMPLEXITY: Low                                   GOALS: One time visit. No goals established.    PLAN: PT FREQUENCY: one time visit    Carmelia Bake, PT, DPT 03/02/2023, 2:56 PM    I concur with the above findings and recommendations of the therapist:  Physician name printed:         Physician's signature:      Date:

## 2023-03-09 DIAGNOSIS — G4733 Obstructive sleep apnea (adult) (pediatric): Secondary | ICD-10-CM | POA: Diagnosis not present

## 2023-03-09 DIAGNOSIS — J449 Chronic obstructive pulmonary disease, unspecified: Secondary | ICD-10-CM | POA: Diagnosis not present

## 2023-03-09 DIAGNOSIS — R296 Repeated falls: Secondary | ICD-10-CM | POA: Diagnosis not present

## 2023-03-09 DIAGNOSIS — M79604 Pain in right leg: Secondary | ICD-10-CM | POA: Diagnosis not present

## 2023-03-12 ENCOUNTER — Other Ambulatory Visit: Payer: Self-pay

## 2023-03-12 MED ORDER — FUROSEMIDE 40 MG PO TABS: 40.0000 mg | ORAL_TABLET | Freq: Every day | ORAL | 0 refills | Status: DC | PRN

## 2023-03-13 ENCOUNTER — Other Ambulatory Visit: Payer: Self-pay | Admitting: Student in an Organized Health Care Education/Training Program

## 2023-03-13 DIAGNOSIS — K219 Gastro-esophageal reflux disease without esophagitis: Secondary | ICD-10-CM

## 2023-03-22 ENCOUNTER — Ambulatory Visit: Payer: 59 | Admitting: Student in an Organized Health Care Education/Training Program

## 2023-03-22 ENCOUNTER — Encounter: Payer: Self-pay | Admitting: Student in an Organized Health Care Education/Training Program

## 2023-03-22 VITALS — BP 139/89 | HR 84 | Temp 97.5°F | Ht 68.0 in | Wt 178.5 lb

## 2023-03-22 DIAGNOSIS — Z7984 Long term (current) use of oral hypoglycemic drugs: Secondary | ICD-10-CM | POA: Diagnosis not present

## 2023-03-22 DIAGNOSIS — E1169 Type 2 diabetes mellitus with other specified complication: Secondary | ICD-10-CM

## 2023-03-22 DIAGNOSIS — J449 Chronic obstructive pulmonary disease, unspecified: Secondary | ICD-10-CM

## 2023-03-22 DIAGNOSIS — I110 Hypertensive heart disease with heart failure: Secondary | ICD-10-CM

## 2023-03-22 DIAGNOSIS — I10 Essential (primary) hypertension: Secondary | ICD-10-CM

## 2023-03-22 DIAGNOSIS — I5022 Chronic systolic (congestive) heart failure: Secondary | ICD-10-CM | POA: Diagnosis not present

## 2023-03-22 LAB — POCT GLYCOSYLATED HEMOGLOBIN (HGB A1C): Hemoglobin A1C: 6.4 % — AB (ref 4.0–5.6)

## 2023-03-22 LAB — GLUCOSE, CAPILLARY: Glucose-Capillary: 127 mg/dL — ABNORMAL HIGH (ref 70–99)

## 2023-03-22 NOTE — Assessment & Plan Note (Signed)
Chronic and stable.  A1c well-controlled at 6.4%.  Plan to continue metformin 1000 mg daily.  Intolerant to Jardiance in the past.  Diabetes complicated by CKD.

## 2023-03-22 NOTE — Progress Notes (Signed)
   Established Patient Office Visit  Subjective   Patient ID: CATALEYA CRISTINA, female    DOB: 02-May-1935  Age: 88 y.o. MRN: [id number]  Chief Complaint  Patient presents with   Diabetes   Follow-up    HPI  88 year old person here for management of hypertension.  Doing very well since I last saw her in November.  No recent ED visits or hospital admissions.  She lives independently with her daughter.  She has chronic hypoxia due to combination of interstitial lung disease and COPD.  She uses 2 L supplemental oxygen as needed for exertion.  She gets out of the house every day, is independent in activities of daily living.  She is most looking forward to fishing in the summertime.  She has decreased capacity to ambulate because of her chronic lung disease and heart disease, she underwent an evaluation and has ordered an electric scooter to help with her mobility around the house.  Reports good adherence with medications.  Denies any concerns, no adverse side effects.  No recent fevers or chills.  Denies chest pain.  No cough.  She uses Lasix as needed for lower extremity edema, usually about 4-5 times weekly.  No lightheadedness with standing, which was an issue in the past when we tried [place].    Objective:     BP (!) 141/84 (BP Location: Left Arm, Patient Position: Sitting, Cuff Size: Small)   Pulse 84   Temp (!) 97.5 F (36.4 C) (Oral)   Ht 5\' 8"  (1.727 m)   Wt 178 lb 8 oz (81 kg)   SpO2 100%   BMI 27.14 kg/m    Physical Exam  General: Well-appearing Neck: Normal thyroid, no lymphadenopathy, normal JVD Heart: Regular, no murmur Lungs: Unlabored breathing on room air, clear to auscultation throughout Extremities: Diffuse osteoarthritis in her knees and hands, 1+ bilateral pitting edema Neuro: Alert, conversational, full strength in the upper and lower extremities, delayed get up and go    Assessment & Plan:   Problem List Items Addressed This Visit       High   Type 2  diabetes mellitus with other specified complication (HCC) - Primary (Chronic)   Chronic and stable.  A1c well-controlled at 6.4%.  Plan to continue metformin 1000 mg daily.  Intolerant to Jardiance in the past.  Diabetes complicated by CKD.      Relevant Orders   POC Hbg A1C (Completed)   Essential hypertension (Chronic)   Chronic and stable.  Blood pressure well-controlled today at 139/80.  Plan to continue Entresto 49-51 twice daily.      Chronic HFrEF (heart failure with reduced ejection fraction) (HCC) (Chronic)   Chronic and stable.  Well compensated today.  Looks euvolemic.  178 pounds.  Plan to continue Entresto 100 mg twice daily and as needed Lasix for decongestion.  Patient was unable to tolerate Jardiance in the past.        Medium    COPD (HCC) (Chronic)   Chronic and stable.  Plan to continue with Spiriva daily.       [name], MD

## 2023-03-22 NOTE — Assessment & Plan Note (Signed)
Chronic and stable.  Plan to continue with Spiriva daily.

## 2023-03-22 NOTE — Assessment & Plan Note (Signed)
Chronic and stable.  Blood pressure well-controlled today at 139/80.  Plan to continue Entresto 49-51 twice daily.

## 2023-03-22 NOTE — Assessment & Plan Note (Signed)
Chronic and stable.  Well compensated today.  Looks euvolemic.  178 pounds.  Plan to continue Entresto 100 mg twice daily and as needed Lasix for decongestion.  Patient was unable to tolerate Jardiance in the past.

## 2023-03-30 ENCOUNTER — Ambulatory Visit: Payer: Self-pay

## 2023-03-30 NOTE — Patient Outreach (Signed)
 Care Coordination   03/30/2023 Name: Caitlyn Aguirre MRN: [id number] DOB: 1935-06-27   Care Coordination Outreach Attempts:  An unsuccessful telephone outreach was attempted today to offer the patient information about available complex care management services.  Follow Up Plan:  Additional outreach attempts will be made to offer the patient complex care management information and services.   Encounter Outcome:  No Answer   Care Coordination Interventions:  No, not indicated     Lonzo Candy, BSN, [facility] [place]  [facility], [facility]  Care Coordinator Phone: [phone]

## 2023-04-01 DIAGNOSIS — J449 Chronic obstructive pulmonary disease, unspecified: Secondary | ICD-10-CM | POA: Diagnosis not present

## 2023-04-01 DIAGNOSIS — R296 Repeated falls: Secondary | ICD-10-CM | POA: Diagnosis not present

## 2023-04-01 DIAGNOSIS — G4733 Obstructive sleep apnea (adult) (pediatric): Secondary | ICD-10-CM | POA: Diagnosis not present

## 2023-04-01 DIAGNOSIS — M79604 Pain in right leg: Secondary | ICD-10-CM | POA: Diagnosis not present

## 2023-04-06 ENCOUNTER — Telehealth: Payer: Self-pay | Admitting: *Deleted

## 2023-04-06 DIAGNOSIS — M79604 Pain in right leg: Secondary | ICD-10-CM | POA: Diagnosis not present

## 2023-04-06 DIAGNOSIS — R296 Repeated falls: Secondary | ICD-10-CM | POA: Diagnosis not present

## 2023-04-06 DIAGNOSIS — J449 Chronic obstructive pulmonary disease, unspecified: Secondary | ICD-10-CM | POA: Diagnosis not present

## 2023-04-06 DIAGNOSIS — G4733 Obstructive sleep apnea (adult) (pediatric): Secondary | ICD-10-CM | POA: Diagnosis not present

## 2023-04-06 NOTE — Progress Notes (Signed)
 Complex Care Management Care Guide Note  04/06/2023 Name: [name] MRN: [id number] DOB: 1935/11/18  Caitlyn Aguirre is a 88 y.o. year old female who is a primary care patient of Mercie Eon, MD and is actively engaged with the care management team. I reached out to Caitlyn Aguirre by phone today to assist with re-scheduling  with the RN Case Manager.  Follow up plan: Unsuccessful telephone outreach attempt made. A HIPAA compliant phone message was left for the patient providing contact information and requesting a return call.  Gwenevere Ghazi  [facility]  [facility], [facility] Guide  Direct Dial: [phone]  Fax [phone]

## 2023-04-12 NOTE — Progress Notes (Signed)
 Complex Care Management Care Guide Note  04/12/2023 Name: SHANEECE STOCKBURGER MRN: [id number] DOB: 1935/09/01  [name] is a 88 y.o. year old female who is a primary care patient of Mercie Eon, MD and is actively engaged with the care management team. I reached out to [name] by phone today to assist with scheduling  with the RN Case Manager.  Follow up plan: Telephone appointment with complex care management team member scheduled for:  3/11  Gwenevere Ghazi  [facility]  [facility], [facility] Guide  Direct Dial: [phone]  Fax [phone]

## 2023-04-13 ENCOUNTER — Ambulatory Visit: Payer: Self-pay

## 2023-04-13 NOTE — Patient Instructions (Signed)
 Visit Information  Thank you for taking time to visit with me today. Please don't hesitate to contact me if I can be of assistance to you.   Following are the goals we discussed today:   Goals Addressed             This Visit's Progress    Patient goals  monitor and manage CHF       Patient Goals/Self Care Activities: -Patient/Caregiver will take medications as prescribed   -Patient/Caregiver will attend all scheduled provider appointments -Patient/Caregiver will call pharmacy for medication refills 3-7 days in advance of running out of medications -Patient/Caregiver will call provider office for new concerns or questions  -Patient/Caregiver will focus on medication adherence by taking medications as prescribed   Provided education on low sodium diet Reviewed Heart Failure Action Plan in depth  Provided education about placing scale on hard, flat surface Advised patient to weigh each morning  Discussed the importance of keeping all appointments with provider    Wt Readings from Last 3 Encounters:  03/22/23 178 lb 8 oz (81 kg)  01/21/23 180 lb (81.6 kg)  12/28/22 174 lb (78.9 kg)  Continue to monitor your body signals          Our next appointment is by telephone on 05/18/23 at 130 pm  Please call the care guide team at [phone] if you need to cancel or reschedule your appointment.   If you are experiencing a Mental Health or Behavioral Health Crisis or need someone to talk to, please call 1-800-273-TALK (toll free, 24 hour hotline)  Patient verbalizes understanding of instructions and care plan provided today and agrees to view in MyChart. Active MyChart status and patient understanding of how to access instructions and care plan via MyChart confirmed with patient.     Juanell Fairly RN, BSN, [facility]  [facility], [facility]  Care Coordinator Phone: [phone]

## 2023-04-13 NOTE — Patient Outreach (Signed)
 Care Coordination   Follow Up Visit Note   04/13/2023 Name: Caitlyn Aguirre MRN: [id number] DOB: May 27, 1935  Caitlyn Aguirre is a 88 y.o. year old female who sees Mercie Eon, MD for primary care. I spoke with  Caitlyn Aguirre by phone today.  What matters to the patients health and wellness today?  Caitlyn Aguirre reported that she is currently in good health, with no complaints of chest pain or shortness of breath. However, she did express concern regarding some swelling in her feet and legs and was uncertain about the importance of monitoring her salt intake. She indicated that she has been consuming meals prepared by various family members, some of whom add more salt than others.  I advised her on the necessity of monitoring her sodium consumption, as excessive intake may be detrimental to her condition. Additionally, I recommended that she take 40 milligrams of furosemide to address fluid retention and emphasized the importance of adequate hydration.  During her most recent appointment on February 17th, her vital signs were stable; her blood pressure was recorded at 139/80, her weight was 178 pounds, and her A1C level was 6.4. She reported feeling generally well and expressed her intention to monitor her dietary habits and adhere to her prescribed medications. We will reconvene for a follow-up discussion in April.    Goals Addressed             This Visit's Progress    Patient goals  monitor and manage CHF       Patient Goals/Self Care Activities: -Patient/Caregiver will take medications as prescribed   -Patient/Caregiver will attend all scheduled provider appointments -Patient/Caregiver will call pharmacy for medication refills 3-7 days in advance of running out of medications -Patient/Caregiver will call provider office for new concerns or questions  -Patient/Caregiver will focus on medication adherence by taking medications as prescribed   Provided education on low sodium  diet Reviewed Heart Failure Action Plan in depth  Provided education about placing scale on hard, flat surface Advised patient to weigh each morning  Discussed the importance of keeping all appointments with provider    Wt Readings from Last 3 Encounters:  03/22/23 178 lb 8 oz (81 kg)  01/21/23 180 lb (81.6 kg)  12/28/22 174 lb (78.9 kg)  Continue to monitor your body signals          SDOH assessments and interventions completed:  No     Care Coordination Interventions:  Yes, provided   Interventions Today    Flowsheet Row Most Recent Value  Chronic Disease   Chronic disease during today's visit Congestive Heart Failure (CHF)  General Interventions   General Interventions Discussed/Reviewed General Interventions Discussed, General Interventions Reviewed  Education Interventions   Education Provided Provided Education  Provided Verbal Education On Nutrition  Nutrition Interventions   Nutrition Discussed/Reviewed Fluid intake, Decreasing salt  Pharmacy Interventions   Pharmacy Dicussed/Reviewed Pharmacy Topics Discussed  Safety Interventions   Safety Discussed/Reviewed Safety Discussed        Follow up plan:  4 /15/25  130pm   Encounter Outcome:  Patient Visit Completed   Juanell Fairly RN, BSN, [facility] [place]  Value[facility]Based Care Institute, [facility]  Care Coordinator Phone: [phone]

## 2023-04-15 ENCOUNTER — Emergency Department (HOSPITAL_COMMUNITY)
Admission: EM | Admit: 2023-04-15 | Discharge: 2023-04-16 | Disposition: A | Discharge status: Home / Self Care | Attending: Emergency Medicine | Admitting: Emergency Medicine

## 2023-04-15 ENCOUNTER — Other Ambulatory Visit: Payer: Self-pay

## 2023-04-15 ENCOUNTER — Ambulatory Visit: Admission: EM | Admit: 2023-04-15 | Discharge: 2023-04-15 | Disposition: A | Discharge status: Home / Self Care

## 2023-04-15 ENCOUNTER — Emergency Department (HOSPITAL_COMMUNITY)

## 2023-04-15 DIAGNOSIS — Z7984 Long term (current) use of oral hypoglycemic drugs: Secondary | ICD-10-CM | POA: Diagnosis not present

## 2023-04-15 DIAGNOSIS — R609 Edema, unspecified: Secondary | ICD-10-CM | POA: Diagnosis not present

## 2023-04-15 DIAGNOSIS — R7981 Abnormal blood-gas level: Secondary | ICD-10-CM | POA: Diagnosis not present

## 2023-04-15 DIAGNOSIS — R404 Transient alteration of awareness: Secondary | ICD-10-CM | POA: Diagnosis not present

## 2023-04-15 DIAGNOSIS — I517 Cardiomegaly: Secondary | ICD-10-CM | POA: Diagnosis not present

## 2023-04-15 DIAGNOSIS — Z79899 Other long term (current) drug therapy: Secondary | ICD-10-CM | POA: Insufficient documentation

## 2023-04-15 DIAGNOSIS — I5023 Acute on chronic systolic (congestive) heart failure: Secondary | ICD-10-CM | POA: Insufficient documentation

## 2023-04-15 DIAGNOSIS — R2243 Localized swelling, mass and lump, lower limb, bilateral: Secondary | ICD-10-CM | POA: Insufficient documentation

## 2023-04-15 DIAGNOSIS — R918 Other nonspecific abnormal finding of lung field: Secondary | ICD-10-CM | POA: Diagnosis not present

## 2023-04-15 DIAGNOSIS — I509 Heart failure, unspecified: Secondary | ICD-10-CM | POA: Diagnosis not present

## 2023-04-15 DIAGNOSIS — R0602 Shortness of breath: Secondary | ICD-10-CM | POA: Diagnosis not present

## 2023-04-15 DIAGNOSIS — I110 Hypertensive heart disease with heart failure: Secondary | ICD-10-CM | POA: Diagnosis not present

## 2023-04-15 LAB — CBC WITH DIFFERENTIAL/PLATELET
Abs Immature Granulocytes: 0.02 10*3/uL (ref 0.00–0.07)
Basophils Absolute: 0 10*3/uL (ref 0.0–0.1)
Basophils Relative: 0 %
Eosinophils Absolute: 0.3 10*3/uL (ref 0.0–0.5)
Eosinophils Relative: 4 %
HCT: 34.6 % — ABNORMAL LOW (ref 36.0–46.0)
Hemoglobin: 11.8 g/dL — ABNORMAL LOW (ref 12.0–15.0)
Immature Granulocytes: 0 %
Lymphocytes Relative: 16 %
Lymphs Abs: 1.2 10*3/uL (ref 0.7–4.0)
MCH: 30.9 pg (ref 26.0–34.0)
MCHC: 34.1 g/dL (ref 30.0–36.0)
MCV: 90.6 fL (ref 80.0–100.0)
Monocytes Absolute: 0.6 10*3/uL (ref 0.1–1.0)
Monocytes Relative: 8 %
Neutro Abs: 5.3 10*3/uL (ref 1.7–7.7)
Neutrophils Relative %: 72 %
Platelets: 190 10*3/uL (ref 150–400)
RBC: 3.82 MIL/uL — ABNORMAL LOW (ref 3.87–5.11)
RDW: 16.4 % — ABNORMAL HIGH (ref 11.5–15.5)
WBC: 7.6 10*3/uL (ref 4.0–10.5)
nRBC: 0 % (ref 0.0–0.2)

## 2023-04-15 LAB — COMPREHENSIVE METABOLIC PANEL
ALT: 13 U/L (ref 0–44)
AST: 21 U/L (ref 15–41)
Albumin: 3.8 g/dL (ref 3.5–5.0)
Alkaline Phosphatase: 77 U/L (ref 38–126)
Anion gap: 12 (ref 5–15)
BUN: 19 mg/dL (ref 8–23)
CO2: 24 mmol/L (ref 22–32)
Calcium: 9.9 mg/dL (ref 8.9–10.3)
Chloride: 105 mmol/L (ref 98–111)
Creatinine, Ser: 1.59 mg/dL — ABNORMAL HIGH (ref 0.44–1.00)
GFR, Estimated: 32 mL/min — ABNORMAL LOW (ref >60)
Glucose, Bld: 97 mg/dL (ref 70–99)
Potassium: 4.3 mmol/L (ref 3.5–5.1)
Sodium: 141 mmol/L (ref 135–145)
Total Bilirubin: 1.1 mg/dL (ref 0.0–1.2)
Total Protein: 7 g/dL (ref 6.5–8.1)

## 2023-04-15 LAB — TROPONIN I (HIGH SENSITIVITY): Troponin I (High Sensitivity): 14 ng/L (ref <18)

## 2023-04-15 LAB — BRAIN NATRIURETIC PEPTIDE: B Natriuretic Peptide: 766.3 pg/mL — ABNORMAL HIGH (ref 0.0–100.0)

## 2023-04-15 MED ORDER — FUROSEMIDE 10 MG/ML IJ SOLN
80.0000 mg | Freq: Once | INTRAMUSCULAR | Status: AC
  Administered 2023-04-15: 80 mg via INTRAVENOUS
  Filled 2023-04-15: qty 8

## 2023-04-15 MED ORDER — POTASSIUM CHLORIDE ER 20 MEQ PO TBCR: 20.0000 meq | EXTENDED_RELEASE_TABLET | Freq: Every day | ORAL | 0 refills | Status: AC

## 2023-04-15 NOTE — ED Provider Triage Note (Signed)
 Emergency Medicine Provider Triage Evaluation Note  Caitlyn Aguirre , a 88 y.o. female  was evaluated in triage.  Pt complains of orthopnea.  Patient endorses worsening orthopnea over the past week as well as bilateral lower extremity edema.  States that she has oxygen at home which she uses at night but now she is on 3 L in the ED due to worsening symptoms. states that she takes water pills which have not been helping the swelling.  Denies any pain in the chest.  Review of Systems  Positive: Shortness of breath, lower extremity edema Negative: Chest pain  Physical Exam  BP 108/82 (BP Location: Right Arm)   Pulse 80   Temp 98.4 F (36.9 C) (Oral)   Resp 16   SpO2 100%  Gen:   Awake, no distress   Resp:  Normal effort  MSK:   Moves extremities without difficulty  Other:  On 3 L nasal cannula  Medical Decision Making  Medically screening exam initiated at 7:59 PM.  Appropriate orders placed.  Caitlyn Aguirre was informed that the remainder of the evaluation will be completed by another provider, this initial triage assessment does not replace that evaluation, and the importance of remaining in the ED until their evaluation is complete.   Maxwell Marion, PA-C 04/15/23 2000

## 2023-04-15 NOTE — ED Provider Notes (Signed)
 [place] EMERGENCY DEPARTMENT AT [facility] Provider Note   CSN: [id number] Arrival date & time: 04/15/23  1916     History  Chief Complaint  Patient presents with   Lower Legs Edema     Caitlyn Aguirre is a 88 y.o. female.  Over the last week, patient has noticed increasing shortness of breath and leg swelling. She was told by her PCP to take one dose of lasix 40 mg as needed for increased fluid, and she did that today. However, she did not see much improvement which prompted her arrival to urgent care.  At the urgent care, she was found to have SpO2 86% on RA requiring 3L of O2. She uses 2L at baseline only when she sleeps. She has not had to increase her O2 requirement over the last week.  She denies any chest pain, nausea, vomiting, fevers, or bowel changes. She has had some increased cough with sputum production, however. The sputum is white in color.     Home Medications Prior to Admission medications   Medication Sig Start Date End Date Taking? Authorizing Provider  potassium chloride 20 MEQ TBCR Take 1 tablet (20 mEq total) by mouth daily for 3 days. 04/15/23 04/18/23 Yes MabeEarvin Hansen, MD  Accu-Chek Softclix Lancets lancets Check 1 time a day as instructed 06/28/19   [name], MD  albuterol (VENTOLIN HFA) 108 (90 Base) MCG/ACT inhaler Inhale 1-2 puffs into the lungs every 6 (six) hours as needed for wheezing or shortness of breath. 02/09/22   [name], MD  amLODipine (NORVASC) 5 MG tablet Take 5 mg by mouth daily. 12/16/22   [provider]  atorvastatin (LIPITOR) 40 MG tablet Take 1 tablet (40 mg total) by mouth daily. 01/14/23   [name], MD  Blood Glucose Monitoring Suppl (ACCU-CHEK GUIDE ME) w/Device KIT Check 1 times a day as instructed 06/28/19   [name], MD  furosemide (LASIX) 40 MG tablet Take 1 tablet (40 mg total) by mouth daily as needed (for weight gain greater than 3 lbs in a day or 5 lbs in  a week). 03/12/23 03/11/24  [name], MD  levothyroxine (SYNTHROID) 75 MCG tablet Take 1 tablet (75 mcg total) by mouth daily. 10/09/22   [name], MD  magnesium oxide (MAG-OX) 400 (240 Mg) MG tablet Take 1 tablet (400 mg total) by mouth daily. 11/05/22   Marjie Skiff E, PA-C  metFORMIN (GLUCOPHAGE) 1000 MG tablet TAKE 1 TABLET BY MOUTH DAILY  WITH BREAKFAST 02/04/23   [name], MD  omeprazole (PRILOSEC) 20 MG capsule TAKE 1 CAPSULE BY MOUTH DAILY 03/15/23   [name], MD  OXYGEN Inhale 2 L into the lungs daily as needed (for breathing).     [provider]  PARoxetine (PAXIL) 30 MG tablet TAKE 1 TABLET BY MOUTH DAILY 03/15/23   [name], MD  sacubitril-valsartan (ENTRESTO) 49-51 MG Take 1 tablet by mouth 2 (two) times daily. 11/02/22   [name], MD  tiotropium (SPIRIVA HANDIHALER) 18 MCG inhalation capsule Place 1 capsule (18 mcg total) into inhaler and inhale daily. INHALE THE CONTENTS OF 1  CAPSULE BY MOUTH VIA  HANDIHALER DAILY Strength: 18 mcg 02/09/22   [name], MD      Allergies    Patient has no known allergies.    Review of Systems   Review of Systems  Physical Exam Updated Vital Signs BP (!) 133/101 (BP Location:  Right Arm)   Pulse 79   Temp 98.2 F (36.8 C) (Oral)   Resp 18   SpO2 100%  Physical Exam Constitutional:      General: She is not in acute distress.    Appearance: Normal appearance. She is not ill-appearing.  HENT:     Head: Normocephalic and atraumatic.  Eyes:     Extraocular Movements: Extraocular movements intact.     Conjunctiva/sclera: Conjunctivae normal.  Cardiovascular:     Rate and Rhythm: Normal rate and regular rhythm.     Pulses: Normal pulses.     Heart sounds: Normal heart sounds.  Pulmonary:     Effort: Pulmonary effort is normal. No respiratory distress.     Comments: Crackles present in bilateral lung bases. Intermittent expiratory wheezes in  upper lung fields. Abdominal:     General: Abdomen is flat. Bowel sounds are normal. There is no distension.     Palpations: Abdomen is soft.     Tenderness: There is no abdominal tenderness.  Musculoskeletal:        General: Normal range of motion.     Comments: 1+ pitting BLE edema to the level of the lower shins without weeping or overlying skin changes.  Skin:    General: Skin is warm and dry.  Neurological:     General: No focal deficit present.     Mental Status: She is alert and oriented to person, place, and time.  Psychiatric:        Mood and Affect: Mood normal.        Behavior: Behavior normal.     ED Results / Procedures / Treatments   Labs (all labs ordered are listed, but only abnormal results are displayed) Labs Reviewed  COMPREHENSIVE METABOLIC PANEL - Abnormal; Notable for the following components:      Result Value   Creatinine, Ser 1.59 (*)    GFR, Estimated 32 (*)    All other components within normal limits  BRAIN NATRIURETIC PEPTIDE - Abnormal; Notable for the following components:   B Natriuretic Peptide 766.3 (*)    All other components within normal limits  CBC WITH DIFFERENTIAL/PLATELET - Abnormal; Notable for the following components:   RBC 3.82 (*)    Hemoglobin 11.8 (*)    HCT 34.6 (*)    RDW 16.4 (*)    All other components within normal limits  CBC WITH DIFFERENTIAL/PLATELET  TROPONIN I (HIGH SENSITIVITY)    EKG None  Radiology DG Chest 2 View Result Date: 04/15/2023 CLINICAL DATA:  Shortness of breath. EXAM: CHEST - 2 VIEW COMPARISON:  Radiograph 10/08/2022, CT 08/13/2022 FINDINGS: Chronic cardiomegaly. Chronic bronchial thickening. No evidence of focal airspace disease. No pneumothorax or significant pleural effusion. No acute osseous findings. IMPRESSION: Chronic cardiomegaly and bronchial thickening. No acute findings. Electronically Signed   By: Narda Rutherford M.D.   On: 04/15/2023 22:26    Procedures Procedures    Medications  Ordered in ED Medications  furosemide (LASIX) injection 80 mg (80 mg Intravenous Given 04/15/23 2232)    ED Course/ Medical Decision Making/ A&P                                 Medical Decision Making Amount and/or Complexity of Data Reviewed Labs: ordered. Radiology: ordered.  Risk Prescription drug management.   88 year old female with a history of HFrEF, T2DM, HTN, COPD, ILD, and CKD3 here with increasing SOB and BLE edema.  Vitals and exam notable for normal SpO2 though with crackles in bilateral lung fields and mild pitting edema of BLE. Differential includes CHF exacerbation, pneumonia, viral process, COPD exacerbation, ACS, and PE.  Will collect CBC, CMP, BNP, and troponins. CXR with mildly increased pulmonary edema on my read. Initial EKG with lower voltage from prior and questionable QTC prolongation/ectopic atrial rhythm; will repeat.  Repeat EKG more in line with prior. Troponins negative. CMP with normal electrolytes and baseline Cr. CBC with Hgb around previous. BNP is 766, down from prior.  On reassessment after lasix administration, patient has been urinating well and is experienced improved SOB. She is back at her baseline oxygen level with good SpO2. She is mobilizing without issue. Given this, feel she is stable for discharge with close follow up. Will recommend she take her lasix 40 mg daily along with Kcl 20 mEq daily for 3 days. Return if worsening before following up with PCP. Patient understanding and in agreement.        Final Clinical Impression(s) / ED Diagnoses Final diagnoses:  Acute on chronic systolic heart failure (HCC)    Rx / DC Orders ED Discharge Orders          Ordered    potassium chloride 20 MEQ TBCR  Daily        04/15/23 2357              Evette Georges, MD 04/16/23 0000    Rolan Bucco, MD 04/16/23 1623

## 2023-04-15 NOTE — Discharge Instructions (Addendum)
 You came in with some extra fluid on your body. We gave you some fluid medication, and you were able to remove a lot of the fluid with improvement in your breathing.  For the next 3 DAYS, be sure to take lasix 40 mg once a day along with the potassium pill I sent in. Be sure to follow up with your primary doctor soon and return if your symptoms worsen before then.

## 2023-04-15 NOTE — ED Triage Notes (Signed)
 Pt says she started having SOB today and thought to come get it checked out. Pt states takes fluid pills but her legs and ankles still swelled when she started experiencing the SOB.

## 2023-04-15 NOTE — ED Notes (Signed)
CBC recollected  

## 2023-04-15 NOTE — ED Notes (Signed)
 Patient is being discharged from the Urgent Care and sent to the Emergency Department via ambulance . Per [facility] and [facility], patient is in need of higher level of care due to low SpO2. Patient is aware and verbalizes understanding of plan of care.  Vitals:   04/15/23 1752 04/15/23 1753  BP: (!) 142/87   Pulse: 94   Resp: 20   Temp: 97.6 F (36.4 C)   SpO2: (!) 86% 92%

## 2023-04-15 NOTE — ED Notes (Signed)
Patient is back

## 2023-04-15 NOTE — ED Triage Notes (Signed)
 Patient arrived with EMS from an urgent care this evening , reports persistent edema /swelling at bilateral legs for several days , takes Lasix , she adds exertional dyspnea .

## 2023-04-16 NOTE — ED Notes (Signed)
 Pt able to safely ambulate on her own.
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
## 2023-04-19 ENCOUNTER — Telehealth: Payer: Self-pay

## 2023-04-19 ENCOUNTER — Other Ambulatory Visit: Payer: Self-pay

## 2023-04-19 MED ORDER — FUROSEMIDE 40 MG PO TABS: 40.0000 mg | ORAL_TABLET | Freq: Every day | ORAL | 0 refills | Status: DC | PRN

## 2023-04-19 NOTE — Addendum Note (Signed)
 Addended by: Cala Bradford on: 04/19/2023 03:49 PM   Modules accepted: Orders

## 2023-04-19 NOTE — Telephone Encounter (Signed)
 Pharmacy requesting a 90 day supply  Medication sent to pharmacy.

## 2023-04-19 NOTE — Telephone Encounter (Signed)
 Medication sent to pharmacy

## 2023-04-19 NOTE — Transitions of Care (Post Inpatient/ED Visit) (Signed)
 04/19/2023  Name: Caitlyn Aguirre MRN: [id number] DOB: [birth year]  Today's TOC FU Call Status: Today's TOC FU Call Status:: Successful TOC FU Call Completed TOC FU Call Complete Date: 04/19/23 Patient's Name and Date of Birth confirmed.  Transition Care Management Follow-up Telephone Call Date of Discharge: 04/16/23 Discharge Facility: [facility]) Type of Discharge: Emergency Department Reason for ED Visit: Cardiac Conditions Cardiac Conditions Diagnosis: Heart Failure (CHF,Heart Failure Diagnosis) How have you been since you were released from the hospital?: Better Any questions or concerns?: No  Items Reviewed: Did you receive and understand the discharge instructions provided?: Yes Medications obtained,verified, and reconciled?: Yes (Medications Reviewed) Any new allergies since your discharge?: No Dietary orders reviewed?: Yes Do you have support at home?: Yes People in Home: child(ren), adult  Medications Reviewed Today: Medications Reviewed Today     Reviewed by Karena Addison, LPN (Licensed Practical Nurse) on 04/19/23 at 1457  Med List Status: <None>   Medication Order Taking? Sig Documenting Provider Last Dose Status Informant  Accu-Chek Softclix Lancets lancets [id number] No Check 1 time a day as instructed [name], MD Taking Active Self, Child, Pharmacy Records  albuterol (VENTOLIN HFA) 108 (90 Base) MCG/ACT inhaler [id number] No Inhale 1-2 puffs into the lungs every 6 (six) hours as needed for wheezing or shortness of breath. [name], MD Taking Active Self, Child, Pharmacy Records  amLODipine [facility]) 5 MG tablet [id number] No Take 5 mg by mouth daily. [provider] 04/15/2023 Active   atorvastatin (LIPITOR) 40 MG tablet [id number] No Take 1 tablet (40 mg total) by mouth daily. [name], MD 04/15/2023 Active   Blood Glucose Monitoring Suppl (ACCU-CHEK GUIDE ME) w/Device KIT [id number] No Check 1 times a day as  instructed [name], MD Taking Active Self, Child, Pharmacy Records           Med Note Alben Deeds, Feliberto Gottron Sep 04, 2019 11:14 AM) Checks her blood sugar prn  furosemide (LASIX) 40 MG tablet [id number]  Take 1 tablet (40 mg total) by mouth daily as needed (for weight gain greater than 3 lbs in a day or 5 lbs in a week). Mercie Eon, MD  Active   levothyroxine (SYNTHROID) 75 MCG tablet [id number] No Take 1 tablet (75 mcg total) by mouth daily. [name], MD 04/15/2023 Active Self, Child, Pharmacy Records           Med Note Helaine Chess, [place] R   Fri Oct 09, 2022  1:17 PM) Suspect patient is taking 150 mcg by accident because she has 2 separate bottles of levothyroxine 75 mcg and is taking one pill from every bottle.   magnesium oxide (MAG-OX) 400 (240 Mg) MG tablet [id number] No Take 1 tablet (400 mg total) by mouth daily. Corrin Parker, PA-C 04/15/2023 Active   metFORMIN (GLUCOPHAGE) 1000 MG tablet [id number] No TAKE 1 TABLET BY MOUTH DAILY  WITH BREAKFAST [name], MD 04/15/2023 Active   omeprazole (PRILOSEC) 20 MG capsule [id number] No TAKE 1 CAPSULE BY MOUTH DAILY [name], MD 04/15/2023 Active   OXYGEN [id number] No Inhale 2 L into the lungs daily as needed (for breathing).  [provider] Taking Active Self, Child, Pharmacy Records           Med Note ([place], Vito Berger Apr 25, 2020  8:15 PM)    PARoxetine (PAXIL) 30 MG tablet [id number] No TAKE 1 TABLET BY MOUTH DAILY [name], MD  04/15/2023 Active   potassium chloride 20 MEQ TBCR [id number]  Take 1 tablet (20 mEq total) by mouth daily for 3 days. Evette Georges, MD  Expired 04/18/23 2359   sacubitril-valsartan (ENTRESTO) 49-51 MG [id number] No Take 1 tablet by mouth 2 (two) times daily. [name], MD 04/15/2023 Active   tiotropium (SPIRIVA HANDIHALER) 18 MCG inhalation capsule [id number] No Place 1 capsule (18 mcg total) into inhaler and inhale daily.  INHALE THE CONTENTS OF 1  CAPSULE BY MOUTH VIA  HANDIHALER DAILY Strength: 18 mcg [name], MD 04/15/2023 Active Self, Child, Pharmacy Records            Home Care and Equipment/Supplies: Were Home Health Services Ordered?: NA Any new equipment or medical supplies ordered?: NA  Functional Questionnaire: Do you need assistance with bathing/showering or dressing?: No Do you need assistance with meal preparation?: No Do you need assistance with eating?: No Do you have difficulty maintaining continence: No Do you need assistance with getting out of bed/getting out of a chair/moving?: No Do you have difficulty managing or taking your medications?: No  Follow up appointments reviewed: PCP Follow-up appointment confirmed?: No (declined appt) MD Provider Line Number:[phone] Given: No Specialist Hospital Follow-up appointment confirmed?: Yes Date of Specialist follow-up appointment?: 05/18/23 Follow-Up Specialty Provider:: cardio Do you need transportation to your follow-up appointment?: No Do you understand care options if your condition(s) worsen?: Yes-patient verbalized understanding    SIGNATURE Karena Addison, LPN [facility] Nurse Health Advisor Direct Dial [phone]

## 2023-04-22 ENCOUNTER — Encounter: Payer: Self-pay | Admitting: Cardiology

## 2023-04-22 ENCOUNTER — Ambulatory Visit: Payer: 59 | Attending: Cardiology | Admitting: Cardiology

## 2023-04-22 VITALS — BP 120/69 | HR 93 | Ht 67.0 in | Wt 176.2 lb

## 2023-04-22 DIAGNOSIS — I10 Essential (primary) hypertension: Secondary | ICD-10-CM | POA: Diagnosis not present

## 2023-04-22 DIAGNOSIS — Z794 Long term (current) use of insulin: Secondary | ICD-10-CM

## 2023-04-22 DIAGNOSIS — I4729 Other ventricular tachycardia: Secondary | ICD-10-CM

## 2023-04-22 DIAGNOSIS — E119 Type 2 diabetes mellitus without complications: Secondary | ICD-10-CM

## 2023-04-22 DIAGNOSIS — I493 Ventricular premature depolarization: Secondary | ICD-10-CM | POA: Diagnosis not present

## 2023-04-22 DIAGNOSIS — I5022 Chronic systolic (congestive) heart failure: Secondary | ICD-10-CM

## 2023-04-22 MED ORDER — FUROSEMIDE 40 MG PO TABS: ORAL_TABLET | ORAL | 3 refills | Status: DC

## 2023-04-22 MED ORDER — FUROSEMIDE 40 MG PO TABS: 40.0000 mg | ORAL_TABLET | ORAL | 3 refills | Status: DC

## 2023-04-22 NOTE — Patient Instructions (Addendum)
 Medication Instructions:  Your physician has recommended you make the following change in your medication:  START: Lasix 40 mg twice weekly (Friday/Saturday) this week for then once weekly  *If you need a refill on your cardiac medications before your next appointment, please call your pharmacy*  Follow-Up: At [facility], you and your health needs are our priority.  As part of our continuing mission to provide you with exceptional heart care, we have created designated Provider Care Teams.  These Care Teams include your primary Cardiologist (physician) and Advanced Practice Providers (APPs -  Physician Assistants and Nurse Practitioners) who all work together to provide you with the care you need, when you need it.  Your next appointment:   16 week(s)  Provider:   Thomasene Ripple, DO
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
## 2023-04-26 ENCOUNTER — Telehealth: Payer: Self-pay | Admitting: Internal Medicine

## 2023-04-26 NOTE — Telephone Encounter (Signed)
 Copied from CRM [id number]. Topic: General - Other >> Apr 12, 2023  3:55 PM Hamdi H wrote: Reason for CRM: Pt rep calling about forms not being signed for power wheelchair. I did connect her with CAL to discuss this further. >> Apr 23, 2023 12:19 PM Corin V wrote: Maralyn Sago called regarding patient mobility device. Patient was seen 12/28/22, which is within the last 6 months. Form is uploaded to Media tab on 04/12/23 stating that she needs to be seen again, but Maralyn Sago was told during call on 04/12/23 that forms could be completed since it's been less than 6 months since her last visit. Please complete and fax forms to [phone] and call [phone] with any questions.   This pt's form has already be rec'd and awaiting Signature from Dr.Machen.

## 2023-04-26 NOTE — Telephone Encounter (Deleted)
 Copied from CRM [id number]. Topic: General - Other >> Apr 12, 2023  3:55 PM Hamdi H wrote: Reason for CRM: Pt rep calling about forms not being signed for power wheelchair. I did connect her with CAL to discuss this further. >> Apr 23, 2023 12:19 PM Corin V wrote: Maralyn Sago called regarding patient mobility device. Patient was seen 12/28/22, which is within the last 6 months. Form is uploaded to Media tab on 04/12/23 stating that she needs to be seen again, but Maralyn Sago was told during call on 04/12/23 that forms could be completed since it's been less than 6 months since her last visit. Please complete and fax forms to [phone] and call [phone] with any questions.  Forms have been rec'd awaiting Signature from Dr. Lafonda Mosses.  Pt's previous Dr. Oswaldo Done is no longer with the [facility] and Dr. Lafonda Mosses will need to Review before completing.

## 2023-04-26 NOTE — Telephone Encounter (Unsigned)
 Copied from CRM [id number]. Topic: General - Other >> Apr 12, 2023  3:55 PM Hamdi H wrote: Reason for CRM: Pt rep calling about forms not being signed for power wheelchair. I did connect her with CAL to discuss this further. >> Apr 23, 2023 12:19 PM Corin V wrote: Maralyn Sago called regarding patient mobility device. Patient was seen 12/28/22, which is within the last 6 months. Form is uploaded to Media tab on 04/12/23 stating that she needs to be seen again, but Maralyn Sago was told during call on 04/12/23 that forms could be completed since it's been less than 6 months since her last visit. Please complete and fax forms to [phone] and call [phone] with any questions.

## 2023-04-29 DIAGNOSIS — J449 Chronic obstructive pulmonary disease, unspecified: Secondary | ICD-10-CM | POA: Diagnosis not present

## 2023-04-29 DIAGNOSIS — M79604 Pain in right leg: Secondary | ICD-10-CM | POA: Diagnosis not present

## 2023-04-29 DIAGNOSIS — R296 Repeated falls: Secondary | ICD-10-CM | POA: Diagnosis not present

## 2023-04-29 DIAGNOSIS — G4733 Obstructive sleep apnea (adult) (pediatric): Secondary | ICD-10-CM | POA: Diagnosis not present

## 2023-05-07 DIAGNOSIS — M79604 Pain in right leg: Secondary | ICD-10-CM | POA: Diagnosis not present

## 2023-05-07 DIAGNOSIS — G4733 Obstructive sleep apnea (adult) (pediatric): Secondary | ICD-10-CM | POA: Diagnosis not present

## 2023-05-07 DIAGNOSIS — R296 Repeated falls: Secondary | ICD-10-CM | POA: Diagnosis not present

## 2023-05-07 DIAGNOSIS — J449 Chronic obstructive pulmonary disease, unspecified: Secondary | ICD-10-CM | POA: Diagnosis not present

## 2023-05-18 ENCOUNTER — Ambulatory Visit: Payer: Self-pay
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
## 2023-05-18 NOTE — Patient Instructions (Signed)
 Visit Information  Thank you for taking time to visit with me today. Please don't hesitate to contact me if I can be of assistance to you before our next scheduled appointment.  Your next care management appointment is by telephone on 06/17/23 at 2 pm  Telephone follow-up in 1 month  Please call the care guide team at [phone] if you need to cancel, schedule, or reschedule an appointment.   Please call [phone]-TALK (toll free, 24 hour hotline) if you are experiencing a Mental Health or Behavioral Health Crisis or need someone to talk to.  Augustin Leber RN, BSN, [facility] [place]  [facility], [facility] Health  Care Coordinator Phone: [phone]

## 2023-05-20 ENCOUNTER — Other Ambulatory Visit: Payer: Self-pay

## 2023-05-20 ENCOUNTER — Ambulatory Visit: Admission: EM | Admit: 2023-05-20 | Discharge: 2023-05-20 | Disposition: A | Discharge status: Home / Self Care

## 2023-05-20 ENCOUNTER — Emergency Department (HOSPITAL_BASED_OUTPATIENT_CLINIC_OR_DEPARTMENT_OTHER)
Admission: EM | Admit: 2023-05-20 | Discharge: 2023-05-20 | Disposition: A | Discharge status: Home / Self Care | Attending: Emergency Medicine | Admitting: Emergency Medicine

## 2023-05-20 ENCOUNTER — Emergency Department (HOSPITAL_BASED_OUTPATIENT_CLINIC_OR_DEPARTMENT_OTHER)

## 2023-05-20 ENCOUNTER — Ambulatory Visit: Payer: Self-pay

## 2023-05-20 DIAGNOSIS — I130 Hypertensive heart and chronic kidney disease with heart failure and stage 1 through stage 4 chronic kidney disease, or unspecified chronic kidney disease: Secondary | ICD-10-CM | POA: Insufficient documentation

## 2023-05-20 DIAGNOSIS — M791 Myalgia, unspecified site: Secondary | ICD-10-CM | POA: Insufficient documentation

## 2023-05-20 DIAGNOSIS — I5023 Acute on chronic systolic (congestive) heart failure: Secondary | ICD-10-CM | POA: Diagnosis not present

## 2023-05-20 DIAGNOSIS — E1122 Type 2 diabetes mellitus with diabetic chronic kidney disease: Secondary | ICD-10-CM | POA: Insufficient documentation

## 2023-05-20 DIAGNOSIS — Z7951 Long term (current) use of inhaled steroids: Secondary | ICD-10-CM | POA: Insufficient documentation

## 2023-05-20 DIAGNOSIS — Z79899 Other long term (current) drug therapy: Secondary | ICD-10-CM | POA: Insufficient documentation

## 2023-05-20 DIAGNOSIS — Z7984 Long term (current) use of oral hypoglycemic drugs: Secondary | ICD-10-CM | POA: Insufficient documentation

## 2023-05-20 DIAGNOSIS — M542 Cervicalgia: Secondary | ICD-10-CM | POA: Insufficient documentation

## 2023-05-20 DIAGNOSIS — R519 Headache, unspecified: Secondary | ICD-10-CM | POA: Diagnosis not present

## 2023-05-20 DIAGNOSIS — I509 Heart failure, unspecified: Secondary | ICD-10-CM | POA: Diagnosis not present

## 2023-05-20 DIAGNOSIS — M7989 Other specified soft tissue disorders: Secondary | ICD-10-CM | POA: Insufficient documentation

## 2023-05-20 DIAGNOSIS — J449 Chronic obstructive pulmonary disease, unspecified: Secondary | ICD-10-CM | POA: Diagnosis not present

## 2023-05-20 DIAGNOSIS — M5030 Other cervical disc degeneration, unspecified cervical region: Secondary | ICD-10-CM | POA: Diagnosis not present

## 2023-05-20 DIAGNOSIS — N189 Chronic kidney disease, unspecified: Secondary | ICD-10-CM | POA: Diagnosis present

## 2023-05-20 DIAGNOSIS — I110 Hypertensive heart disease with heart failure: Secondary | ICD-10-CM | POA: Diagnosis not present

## 2023-05-20 DIAGNOSIS — R0989 Other specified symptoms and signs involving the circulatory and respiratory systems: Secondary | ICD-10-CM | POA: Diagnosis not present

## 2023-05-20 LAB — BASIC METABOLIC PANEL WITH GFR
Anion gap: 10 (ref 5–15)
BUN: 22 mg/dL (ref 8–23)
CO2: 25 mmol/L (ref 22–32)
Calcium: 10.3 mg/dL (ref 8.9–10.3)
Chloride: 107 mmol/L (ref 98–111)
Creatinine, Ser: 1.5 mg/dL — ABNORMAL HIGH (ref 0.44–1.00)
GFR, Estimated: 34 mL/min — ABNORMAL LOW (ref >60)
Glucose, Bld: 132 mg/dL — ABNORMAL HIGH (ref 70–99)
Potassium: 3.9 mmol/L (ref 3.5–5.1)
Sodium: 142 mmol/L (ref 135–145)

## 2023-05-20 LAB — CBC
HCT: 36.1 % (ref 36.0–46.0)
Hemoglobin: 12.2 g/dL (ref 12.0–15.0)
MCH: 30.7 pg (ref 26.0–34.0)
MCHC: 33.8 g/dL (ref 30.0–36.0)
MCV: 90.9 fL (ref 80.0–100.0)
Platelets: 192 10*3/uL (ref 150–400)
RBC: 3.97 MIL/uL (ref 3.87–5.11)
RDW: 15.8 % — ABNORMAL HIGH (ref 11.5–15.5)
WBC: 6.5 10*3/uL (ref 4.0–10.5)
nRBC: 0 % (ref 0.0–0.2)

## 2023-05-20 LAB — BRAIN NATRIURETIC PEPTIDE: B Natriuretic Peptide: 914.7 pg/mL — ABNORMAL HIGH (ref 0.0–100.0)

## 2023-05-20 MED ORDER — LIDOCAINE 5 % EX PTCH: 2.0000 | MEDICATED_PATCH | CUTANEOUS | 0 refills | Status: DC

## 2023-05-20 MED ORDER — HYDROCODONE-ACETAMINOPHEN 5-325 MG PO TABS
1.0000 | ORAL_TABLET | Freq: Once | ORAL | Status: AC
  Administered 2023-05-20: 1 via ORAL
  Filled 2023-05-20: qty 1

## 2023-05-20 NOTE — ED Notes (Addendum)
 Pt currently resting. O2 sat reading in the low 90s. NAD. Pt normally wears 2L of oxygen at home. Pt placed on 2L via [place] per PA order.

## 2023-05-20 NOTE — ED Notes (Addendum)
 Pt ambulated to bathroom with pulse ox while on 2L of oxygen via [place]. O2 sat remained at 94%. PA notified.

## 2023-05-20 NOTE — ED Provider Notes (Signed)
 [place] EMERGENCY DEPARTMENT AT [facility] Provider Note   CSN: [id number] Arrival date & time: 05/20/23  1551     History  Chief Complaint  Patient presents with   Neck Pain   Leg Swelling    Caitlyn Aguirre is a 88 y.o. female past medical history significant for COPD, diabetes, hypertension, CKD, CHF, and MI presents today from urgent care for bilateral neck pain that began on Monday that is worse with movement.  The pain begins behind the patient's ears and wraps to the front of her neck/chest.  Patient also reports increased leg swelling.  Patient sent here by urgent care for labs and CT. Patient denies fever, chills, chest pain, nausea, vomiting, or shortness of breath.   Neck Pain      Home Medications Prior to Admission medications   Medication Sig Start Date End Date Taking? Authorizing Provider  lidocaine  (LIDODERM ) 5 % Place 2 patches onto the skin daily. Remove & Discard patch within 12 hours or as directed by MD 05/20/23  Yes Carie Charity, PA-C  Accu-Chek Softclix Lancets lancets Check 1 time a day as instructed 06/28/19   Ether Hercules, MD  albuterol  (VENTOLIN  HFA) [phone] Base) MCG/ACT inhaler Inhale 1-2 puffs into the lungs every 6 (six) hours as needed for wheezing or shortness of breath. 02/09/22   Ether Hercules, MD  amLODipine  (NORVASC ) 5 MG tablet Take 5 mg by mouth daily. 12/16/22   [provider]  atorvastatin  (LIPITOR) 40 MG tablet Take 1 tablet (40 mg total) by mouth daily. 01/14/23   Ether Hercules, MD  Blood Glucose Monitoring Suppl (ACCU-CHEK GUIDE ME) w/Device KIT Check 1 times a day as instructed 06/28/19   Ether Hercules, MD  furosemide  (LASIX ) 40 MG tablet Take 1 tablet (40 mg total) by mouth once a week. Take 40 mg (one tablet) twice weekly (Friday/Saturday) than once weekly 04/22/23   Tobb, Kardie, DO  levothyroxine  (SYNTHROID ) 75 MCG tablet Take 1 tablet (75 mcg total) by mouth daily. 10/09/22    Ether Hercules, MD  magnesium  oxide (MAG-OX) 400 (240 Mg) MG tablet Take 1 tablet (400 mg total) by mouth daily. 11/05/22   Goodrich, Callie E, PA-C  metFORMIN  (GLUCOPHAGE ) 1000 MG tablet TAKE 1 TABLET BY MOUTH DAILY  WITH BREAKFAST 02/04/23   Ether Hercules, MD  omeprazole  (PRILOSEC) 20 MG capsule TAKE 1 CAPSULE BY MOUTH DAILY 03/15/23   Ether Hercules, MD  OXYGEN  Inhale 2 L into the lungs daily as needed (for breathing).     [provider]  PARoxetine  (PAXIL ) 30 MG tablet TAKE 1 TABLET BY MOUTH DAILY 03/15/23   Ether Hercules, MD  potassium chloride  20 MEQ TBCR Take 1 tablet (20 mEq total) by mouth daily for 3 days. 04/15/23 04/18/23  Dema Filler, MD  sacubitril -valsartan  (ENTRESTO ) 49-51 MG Take 1 tablet by mouth 2 (two) times daily. 11/02/22   Ether Hercules, MD  tiotropium (SPIRIVA  HANDIHALER) 18 MCG inhalation capsule Place 1 capsule (18 mcg total) into inhaler and inhale daily. INHALE THE CONTENTS OF 1  CAPSULE BY MOUTH VIA  HANDIHALER DAILY Strength: 18 mcg 02/09/22   Vincent, Duncan Thomas, MD      Allergies    Patient has no known allergies.    Review of Systems   Review of Systems  Cardiovascular:  Positive for leg swelling.  Musculoskeletal:  Positive for neck pain.    Physical Exam Updated Vital Signs BP 131/81  Pulse 78   Temp (!) 97.5 F (36.4 C) (Oral)   Resp 18   Ht 5\' 6"  (1.676 m)   Wt 79.4 kg   SpO2 96%   BMI 28.25 kg/m  Physical Exam Vitals and nursing note reviewed.  Constitutional:      General: She is not in acute distress.    Appearance: Normal appearance. She is well-developed. She is not ill-appearing, toxic-appearing or diaphoretic.  HENT:     Head: Normocephalic and atraumatic.     Right Ear: External ear normal.     Left Ear: External ear normal.     Mouth/Throat:     Mouth: Mucous membranes are moist.  Eyes:     Extraocular Movements: Extraocular movements intact.     Conjunctiva/sclera: Conjunctivae  normal.  Neck:     Comments: Patient has no bony tenderness, deformity, step-offs, paraspinal muscle tenderness in the C-spine region.  Patient has bilateral muscular tenderness behind her ears and down into the bilateral sternocleidomastoid muscle distribution. Cardiovascular:     Rate and Rhythm: Normal rate and regular rhythm.     Pulses: Normal pulses.     Heart sounds: Normal heart sounds. No murmur heard. Pulmonary:     Effort: Pulmonary effort is normal. No respiratory distress.     Breath sounds: Normal breath sounds.  Abdominal:     Palpations: Abdomen is soft.     Tenderness: There is no abdominal tenderness.  Musculoskeletal:        General: No swelling.     Cervical back: Neck supple. No rigidity.     Right lower leg: No edema.     Left lower leg: No edema.  Skin:    General: Skin is warm and dry.     Capillary Refill: Capillary refill takes less than 2 seconds.  Neurological:     General: No focal deficit present.     Mental Status: She is alert and oriented to person, place, and time.     Cranial Nerves: No cranial nerve deficit.     Sensory: No sensory deficit.     Motor: No weakness.  Psychiatric:        Mood and Affect: Mood normal.     ED Results / Procedures / Treatments   Labs (all labs ordered are listed, but only abnormal results are displayed) Labs Reviewed  BASIC METABOLIC PANEL WITH GFR - Abnormal; Notable for the following components:      Result Value   Glucose, Bld 132 (*)    Creatinine, Ser 1.50 (*)    GFR, Estimated 34 (*)    All other components within normal limits  CBC - Abnormal; Notable for the following components:   RDW 15.8 (*)    All other components within normal limits  BRAIN NATRIURETIC PEPTIDE - Abnormal; Notable for the following components:   B Natriuretic Peptide 914.7 (*)    All other components within normal limits    EKG EKG Interpretation Date/Time:  Thursday May 20 2023 16:20:58 EDT Ventricular Rate:  85 PR  Interval:  176 QRS Duration:  80 QT Interval:  354 QTC Calculation: 421 R Axis:   68  Text Interpretation: Normal sinus rhythm Nonspecific T wave abnormality Abnormal ECG When compared with ECG of 15-Apr-2023 19:45, Sinus rhythm has replaced Ectopic atrial rhythm Nonspecific T wave abnormality, improved in Inferior leads No significant change since last tracing Confirmed by Zackowski, Scott [phone]) on 05/20/2023 5:16:52 PM  Radiology DG Chest Portable 1 View Result Date: 05/20/2023  CLINICAL DATA:  leg swelling EXAM: PORTABLE CHEST - 1 VIEW COMPARISON:  04/15/2023. FINDINGS: Cardiac silhouette is prominent. There is pulmonary interstitial prominence with vascular congestion. No focal consolidation. No pneumothorax or pleural effusion identified. IMPRESSION: Findings suggest CHF. Electronically Signed   By: Sydell Eva M.D.   On: 05/20/2023 20:29   CT Cervical Spine Wo Contrast Result Date: 05/20/2023 CLINICAL DATA:  Neck pain EXAM: CT CERVICAL SPINE WITHOUT CONTRAST TECHNIQUE: Multidetector CT imaging of the cervical spine was performed without intravenous contrast. Multiplanar CT image reconstructions were also generated. RADIATION DOSE REDUCTION: This exam was performed according to the departmental dose-optimization program which includes automated exposure control, adjustment of the mA and/or kV according to patient size and/or use of iterative reconstruction technique. COMPARISON:  02/02/2018 FINDINGS: Alignment: No subluxation Skull base and vertebrae: No acute fracture. No primary bone lesion or focal pathologic process. Soft tissues and spinal canal: No prevertebral fluid or swelling. No visible canal hematoma. Disc levels: Degenerative disc disease with large flowing anterior osteophytes. Mild bilateral degenerative facet disease, right greater than left. No neural foraminal narrowing. No focal disc herniation visualized. Upper chest: No acute findings Other: None IMPRESSION: No acute bony  abnormality. Degenerative disc and facet disease. Electronically Signed   By: Janeece Mechanic M.D.   On: 05/20/2023 19:11    Procedures Procedures    Medications Ordered in ED Medications  HYDROcodone -acetaminophen  (NORCO/VICODIN) 5-325 MG per tablet 1 tablet (1 tablet Oral Given 05/20/23 1730)    ED Course/ Medical Decision Making/ A&P                                 Medical Decision Making Amount and/or Complexity of Data Reviewed Labs: ordered. Radiology: ordered.   This patient presents to the ED for concern of neck pain and bilateral lower extremity swelling, this involves an extensive number of treatment options, and is a complaint that carries with it a high risk of complications and morbidity.  The differential diagnosis includes CHF exacerbation, musculoskeletal pain, vertebral fracture, epidural abscess, nerve impingement, pneumonia, COPD exacerbation   Co morbidities that complicate the patient evaluation  Diabetes, history of MI, CHF, COPD, hypertension, CKD   Additional history obtained:  Additional history obtained from family External records from outside source obtained and reviewed including cardiology office visit   Lab Tests:  I Ordered, and personally interpreted labs.  The pertinent results include: Elevated creatinine and decreased GFR consistent with CKD, BNP 914.7   Imaging Studies ordered:  I ordered imaging studies including chest x-ray I independently visualized and interpreted imaging which showed findings suggest CHF, pulmonary interstitial prominence with vascular congestion. I agree with the radiologist interpretation   Cardiac Monitoring: / EKG:  The patient was maintained on a cardiac monitor.  I personally viewed and interpreted the cardiac monitored which showed an underlying rhythm of: Normal sinus rhythm, nonspecific T wave abnormality   Problem List / ED Course / Critical interventions / Medication management  I ordered  medication including Norco for pain Reevaluation of the patient after these medicines showed that the patient improved I have reviewed the patients home medicines and have made adjustments as needed   Test / Admission - Considered:  Consider for admission or further workup however patient's vital signs, physical exam, labs, and imaging were reassuring.  Patient is mildly fluid overloaded but able to maintain pulse ox greater than 93% on home oxygen  of 2L [place].  Patient advised to double her dose of Lasix  for the next 2 days and follow-up with her primary care in the next couple days for further evaluation and treatment.  Patient also advised to take Tylenol  and use heat as needed for neck pain I feel is muscular in nature.  Patient given strict return precautions.        Final Clinical Impression(s) / ED Diagnoses Final diagnoses:  Acute on chronic systolic heart failure (HCC)  Muscle pain    Rx / DC Orders ED Discharge Orders          Ordered    lidocaine  (LIDODERM ) 5 %  Every 24 hours        05/20/23 2156              Carie Charity, PA-C 05/20/23 2157    Nicklas Barns, MD 05/21/23 [phone]
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
## 2023-05-20 NOTE — Discharge Instructions (Addendum)
 Today you were seen for muscular pain and CHF.  Please double your dose of Lasix for the next 2 days as you are mildly fluid overloaded.  You may take Tylenol as needed for your neck pain and apply heat/cold as needed.  Please pick up your Lidoderm patches and use as needed for neck pain.  Please follow-up with your primary care in the next few days for further evaluation and treatment.  Please return to the ED if you have worsening shortness of breath, increased leg swelling, or radiating pain.  Thank you for letting us  treat you today. After reviewing your labs and imaging, I feel you are safe to go home. Please follow up with your PCP in the next several days and provide them with your records from this visit. Return to the Emergency Room if pain becomes severe or symptoms worsen.

## 2023-05-20 NOTE — ED Triage Notes (Signed)
"  My neck is hurting, now on both sides with a headache". No nausea. No vomiting. Not much of an appetite. No virus prior to this. No injury to neck/face/head. "She also has her feet swelling, this is something her PCP follow's but more persistent recently".

## 2023-05-20 NOTE — ED Notes (Signed)
 Reviewed discharge instructions, medications, and home care with pt. Pt verbalized understanding and had no further questions. Pt exited ED via wheelchair without complications.

## 2023-05-20 NOTE — ED Triage Notes (Signed)
 Pt POV with family reporting bilateral neck pain that began Monday, worsened with movement. Also reporting increased leg swelling, hx CHF, denies SOB. Seen at [facility] today and advised to come to ED for labs and CT.

## 2023-05-20 NOTE — Telephone Encounter (Signed)
 Per chart, pt is currently at [facility].

## 2023-05-20 NOTE — ED Notes (Signed)
 Patient is being discharged from the Urgent Care and sent to the Emergency Department via POV . Per Leonor Ramsay, patient is in need of higher level of care due to need for further evaluation of dizziness and neck pain. Patient is aware and verbalizes understanding of plan of care.  Vitals:   05/20/23 1447 05/20/23 1451  BP: (!) 118/54 116/66  Pulse: 78 68  Resp: 20 20  Temp: 97.9 F (36.6 C)   SpO2: 92% 92%

## 2023-05-20 NOTE — Telephone Encounter (Signed)
  Chief Complaint: neck pain Symptoms: pain;can't turn head  Disposition: [] ED /[x] Urgent Care (no appt availability in office) / [] Appointment(In office/virtual)/ []  [facility]/ [] Home Care/ [] Refused Recommended Disposition /[] [facility] [place]/ []  Follow-up with PCP Additional Notes: Daughter Devra Fontana called on behalf of pt with concerns of neck pain. Pt was speaker phone and shared pain started Monday-cause is not known. Pt stated the pain starts behind ears and travels down to neck. Pt stated it is very painful to turn neck. Pt rated pain 10/10. Pt denies chest pain/SOB/weakness or numbness in extremities.. Pt has tried OTC pain medication/hot towel compresses and doesn't get relief. No office appts available until 4/21. RN advised pt go to urgent care now. Devra Fontana stated they will leave now. RN gave care advice and pt verbalized understanding.              Copied from CRM [id number]. Topic: Clinical - Red Word Triage >> May 20, 2023  1:34 PM Shelby Dessert H wrote: [facility] that prompted transfer to Nurse Triage: Patient called and said she was experiencing really bad neck pain on both sides, on a scale of 1-10 her pain level is a 10. Patient is not experiencing any fever, just headaches, patients neck has been hurting since Monday. Patient has taken Tylenol, Ibuprofen, and she already takes a Asprin everyday with her medicine, pain is non stop Reason for Disposition  [1] SEVERE neck pain (e.g., excruciating, unable to do any normal activities) AND [2] not improved after 2 hours of pain medicine  Answer Assessment - Initial Assessment Questions 1. ONSET: "When did the pain begin?"      Monday 2. LOCATION: "Where does it hurt?"      Behind ears down neck-both sides 3. PATTERN "Does the pain come and go, or has it been constant since it started?"      Constant  4. SEVERITY: "How bad is the pain?"  (Scale 1-10; or mild, moderate, severe)   - NO PAIN (0): no pain or only  slight stiffness    - MILD (1-3): doesn't interfere with normal activities    - MODERATE (4-7): interferes with normal activities or awakens from sleep    - SEVERE (8-10):  excruciating pain, unable to do any normal activities      10 5. RADIATION: "Does the pain go anywhere else, shoot into your arms?"     denies 6. CORD SYMPTOMS: "Any weakness or numbness of the arms or legs?"     denies 7. CAUSE: "What do you think is causing the neck pain?"     Not sure 8. NECK OVERUSE: "Any recent activities that involved turning or twisting the neck?"     denies 9. OTHER SYMPTOMS: "Do you have any other symptoms?" (e.g., headache, fever, chest pain, difficulty breathing, neck swelling)     Headache, ankle swelling  Protocols used: Neck Pain or Stiffness-A-AH

## 2023-05-21 ENCOUNTER — Telehealth: Payer: Self-pay

## 2023-05-21 NOTE — Transitions of Care (Post Inpatient/ED Visit) (Signed)
 05/21/2023  Name: Caitlyn Aguirre MRN: [id number] DOB: 1935/10/21  Today's TOC FU Call Status: Today's TOC FU Call Status:: Successful TOC FU Call Completed TOC FU Call Complete Date: 05/21/23 Patient's Name and Date of Birth confirmed.  Transition Care Management Follow-up Telephone Call Date of Discharge: 05/20/23 Discharge Facility: [facility] Type of Discharge: Emergency Department Reason for ED Visit: Other: (myalgia) How have you been since you were released from the hospital?: Better Any questions or concerns?: No  Items Reviewed: Did you receive and understand the discharge instructions provided?: Yes Medications obtained,verified, and reconciled?: Yes (Medications Reviewed) Any new allergies since your discharge?: No Dietary orders reviewed?: Yes Do you have support at home?: Yes People in Home [RPT]: child(ren), adult  Medications Reviewed Today: Medications Reviewed Today     Reviewed by Darrall Ellison, LPN (Licensed Practical Nurse) on 05/21/23 at 1020  Med List Status: <None>   Medication Order Taking? Sig Documenting Provider Last Dose Status Informant  Accu-Chek Softclix Lancets lancets [id number] No Check 1 time a day as instructed Ether Hercules, MD Unknown Active Self, Child, Pharmacy Records  albuterol  (VENTOLIN  HFA) 108 [phone] Base) MCG/ACT inhaler [id number] No Inhale 1-2 puffs into the lungs every 6 (six) hours as needed for wheezing or shortness of breath. Ether Hercules, MD Unknown Active Self, Child, Pharmacy Records  amLODipine  (NORVASC ) 5 MG tablet [id number] No Take 5 mg by mouth daily. [provider] Unknown Active   atorvastatin  (LIPITOR) 40 MG tablet [id number] No Take 1 tablet (40 mg total) by mouth daily. Ether Hercules, MD Unknown Active   Blood Glucose Monitoring Suppl (ACCU-CHEK GUIDE ME) w/Device Suzanne Erps [id number] No Check 1 times a day as instructed Ether Hercules, MD Unknown Active Self, Child,  Pharmacy Records           Med Note Aquilla Bayley, Page Boast Sep 04, 2019 11:14 AM) Checks her blood sugar prn  furosemide  (LASIX ) 40 MG tablet [id number] No Take 1 tablet (40 mg total) by mouth once a week. Take 40 mg (one tablet) twice weekly (Friday/Saturday) than once weekly Tobb, Kardie, DO Unknown Active   levothyroxine  (SYNTHROID ) 75 MCG tablet [id number] No Take 1 tablet (75 mcg total) by mouth daily. Ether Hercules, MD Unknown Active Self, Child, Pharmacy Records           Med Note [facility], [place] R   Fri Oct 09, 2022  1:17 PM) Suspect patient is taking 150 mcg by accident because she has 2 separate bottles of levothyroxine  75 mcg and is taking one pill from every bottle.   lidocaine  (LIDODERM ) 5 % [id number]  Place 2 patches onto the skin daily. Remove & Discard patch within 12 hours or as directed by MD Keith, Kayla N, PA-C  Active   magnesium  oxide (MAG-OX) 400 (240 Mg) MG tablet [id number] No Take 1 tablet (400 mg total) by mouth daily. Goodrich, Callie E, PA-C Unknown Active   metFORMIN  (GLUCOPHAGE ) 1000 MG tablet [id number] No TAKE 1 TABLET BY MOUTH DAILY  WITH BREAKFAST Ether Hercules, MD Unknown Active   omeprazole  (PRILOSEC) 20 MG capsule [id number] No TAKE 1 CAPSULE BY MOUTH DAILY Ether Hercules, MD Unknown Active   OXYGEN  [id number] No Inhale 2 L into the lungs daily as needed (for breathing).  [provider] Unknown Active Self, Child, Pharmacy Records           Med Note ([place], Marya Smack Apr 25, 2020  8:15 PM)  PARoxetine  (PAXIL ) 30 MG tablet [id number] No TAKE 1 TABLET BY MOUTH DAILY Ether Hercules, MD Unknown Active   potassium chloride  20 MEQ TBCR [id number] No Take 1 tablet (20 mEq total) by mouth daily for 3 days. Dema Filler, MD Unknown Expired 04/18/23 2359   sacubitril -valsartan  (ENTRESTO ) 49-51 MG [id number] No Take 1 tablet by mouth 2 (two) times daily. Ether Hercules, MD Unknown Active   tiotropium (SPIRIVA   HANDIHALER) 18 MCG inhalation capsule [id number] No Place 1 capsule (18 mcg total) into inhaler and inhale daily. INHALE THE CONTENTS OF 1  CAPSULE BY MOUTH VIA  HANDIHALER DAILY Strength: 18 mcg Vincent, Duncan Thomas, MD Unknown Active Self, Child, Pharmacy Records            Home Care and Equipment/Supplies: Were Home Health Services Ordered?: NA Any new equipment or medical supplies ordered?: NA  Functional Questionnaire: Do you need assistance with bathing/showering or dressing?: No Do you need assistance with meal preparation?: No Do you need assistance with eating?: No Do you have difficulty maintaining continence: No Do you need assistance with getting out of bed/getting out of a chair/moving?: No Do you have difficulty managing or taking your medications?: No  Follow up appointments reviewed: PCP Follow-up appointment confirmed?: No (sent message to staff to schedule) MD Provider Line Number:[phone] Given: No Specialist Hospital Follow-up appointment confirmed?: NA Do you need transportation to your follow-up appointment?: No Do you understand care options if your condition(s) worsen?: Yes-patient verbalized understanding    SIGNATURE Darrall Ellison, LPN [facility] Nurse Health Advisor Direct Dial [phone]

## 2023-05-23 ENCOUNTER — Encounter: Payer: Self-pay | Admitting: Physician Assistant

## 2023-05-25 ENCOUNTER — Ambulatory Visit: Payer: Self-pay

## 2023-05-25 NOTE — Telephone Encounter (Signed)
 Pt would like to know what her last lab results were. Please call pt back one the PCP has reviewed lab results. Pt does not use mychart.  Copied from CRM [id number]. Topic: Clinical - Lab/Test Results >> May 25, 2023  4:34 PM Caitlyn Aguirre wrote: Reason for CRM: patient calling regarding lab results Reason for Disposition . [1] Caller requesting NON-URGENT health information AND [2] PCP's office is the best resource  Protocols used: Information Only Call - No Triage-A-AH

## 2023-05-26 DIAGNOSIS — M1990 Unspecified osteoarthritis, unspecified site: Secondary | ICD-10-CM | POA: Diagnosis not present

## 2023-05-26 DIAGNOSIS — G459 Transient cerebral ischemic attack, unspecified: Secondary | ICD-10-CM | POA: Diagnosis not present

## 2023-05-26 DIAGNOSIS — J449 Chronic obstructive pulmonary disease, unspecified: Secondary | ICD-10-CM | POA: Diagnosis not present

## 2023-05-26 DIAGNOSIS — I5020 Unspecified systolic (congestive) heart failure: Secondary | ICD-10-CM | POA: Diagnosis not present

## 2023-05-27 NOTE — Telephone Encounter (Signed)
 Attempted to call patient, but went straight to voicemail. Left VM asking her to call our clinic back.    I reviewed her labs.  They showed that she has some extra fluid on her body, so the ER doctor recommends that she double her lasix  dose from now until she sees us  in clinic on Monday.  Otherwise, her labs looked good - her kidney function is stable.   We will repeat her labs when we see her on Monday.

## 2023-05-30 DIAGNOSIS — J449 Chronic obstructive pulmonary disease, unspecified: Secondary | ICD-10-CM | POA: Diagnosis not present

## 2023-05-30 DIAGNOSIS — R296 Repeated falls: Secondary | ICD-10-CM | POA: Diagnosis not present

## 2023-05-30 DIAGNOSIS — G4733 Obstructive sleep apnea (adult) (pediatric): Secondary | ICD-10-CM | POA: Diagnosis not present

## 2023-05-30 DIAGNOSIS — M79604 Pain in right leg: Secondary | ICD-10-CM | POA: Diagnosis not present

## 2023-05-31 ENCOUNTER — Ambulatory Visit (INDEPENDENT_AMBULATORY_CARE_PROVIDER_SITE_OTHER): Admitting: Student

## 2023-05-31 VITALS — BP 132/90 | HR 86 | Temp 98.2°F | Ht 66.0 in | Wt 176.2 lb

## 2023-05-31 DIAGNOSIS — M7918 Myalgia, other site: Secondary | ICD-10-CM

## 2023-05-31 DIAGNOSIS — I5022 Chronic systolic (congestive) heart failure: Secondary | ICD-10-CM

## 2023-05-31 LAB — BASIC METABOLIC PANEL WITH GFR
Anion gap: 12 (ref 5–15)
BUN: 25 mg/dL — ABNORMAL HIGH (ref 8–23)
CO2: 23 mmol/L (ref 22–32)
Calcium: 10.2 mg/dL (ref 8.9–10.3)
Chloride: 106 mmol/L (ref 98–111)
Creatinine, Ser: 1.46 mg/dL — ABNORMAL HIGH (ref 0.44–1.00)
GFR, Estimated: 35 mL/min — ABNORMAL LOW (ref >60)
Glucose, Bld: 105 mg/dL — ABNORMAL HIGH (ref 70–99)
Potassium: 4.1 mmol/L (ref 3.5–5.1)
Sodium: 141 mmol/L (ref 135–145)

## 2023-05-31 LAB — BRAIN NATRIURETIC PEPTIDE: B Natriuretic Peptide: 754.3 pg/mL — ABNORMAL HIGH (ref 0.0–100.0)

## 2023-05-31 MED ORDER — DICLOFENAC SODIUM 1 % EX GEL: 2.0000 g | Freq: Four times a day (QID) | CUTANEOUS | 1 refills | Status: AC
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
## 2023-05-31 NOTE — Patient Instructions (Signed)
 Thank you, [name] for allowing us  to provide your care today. Today we discussed:  For your neck muscles.  - You can apply Voltaren  gel along your neck muscles - You can also try heating packs   Remember: - Take one tablet of lasix  when you note:  ::: Greater than 2 pounds in one day or 5 pounds in one week  I have ordered the following labs for you:   Lab Orders         Brain natriuretic peptide         BMP8+Anion Gap      Tests ordered today:  Labs   Referrals ordered today:   Referral Orders  No referral(s) requested today     I have ordered the following medication/changed the following medications:   Stop the following medications: There are no discontinued medications.   Start the following medications: Meds ordered this encounter  Medications   diclofenac  Sodium (VOLTAREN ) 1 % GEL    Sig: Apply 2 g topically 4 (four) times daily.    Dispense:  50 g    Refill:  1     Follow up:  1 month for chronic conditions      Remember:   Should you have any questions or concerns please call the internal medicine clinic at [phone].     Lanney Pitts, DO [facility] '

## 2023-06-01 DIAGNOSIS — M7918 Myalgia, other site: Secondary | ICD-10-CM | POA: Insufficient documentation

## 2023-06-01 NOTE — Assessment & Plan Note (Signed)
 Patient reports that she initially went to ED for neck pain, reports that started about 2 weeks ago describes it as muscular hypertonicity with limited range of motion to rotation of her neck to left.  Denies any trauma.  Denies any lymphadenopathy.  Denies any recent fevers, chills or sore throat.  Reports that the pain is mild, she has tried the lidocaine  patches and it does not help much.  She reports that along with the muscular pain she sometimes gets headaches as well that mostly localized to her frontal head.  Per exam, she has hypertonic muscles noted of the R sternocleidomastoid, and no lymphadenopathy.  His range of motion mildly restricted with left rotation.  Otherwise no acute point tenderness noted.   - Patient was advised to apply Voltaren  gel - Take Tylenol  as needed for pain relief -Counseled on stretching exercises

## 2023-06-01 NOTE — Assessment & Plan Note (Signed)
 This is a pleasant 88 year old female who presents here today for an ED follow-up.  Patient was seen in the ED for neck pain and lower extremity swelling, concern for mild HFrEF exacerbation.  She was told to double her Lasix  for 10 days then back to her normal schedule.  Presently, patient reports that she took 80 mg of Lasix  for 3 days, which improved her swelling of her lower extremities.  Reports that she takes the Lasix  40 mg as needed when she notices swelling of her feet.  Weight today is 176 pounds, closer to her dry weight.  Her last office visit with cardiology was 04/22/2023 that included Lasix  40 mg once a week.  Echo with LVEF of 25 to 30% with severely reduced LV function, global hypokinesis, grade 2 diastolic dysfunction, reduced RV function, PV regurgitation.  Per exam, she appears euvolemic, no crackles noted per lung exam.  No lower extremity pitting edema. -Patient was advised to take Lasix  40 mg if she notices any changes in her weight, 2 pounds in 1 day or 5 pounds in 1 week. - Continue Entresto  49-51 2 times a day

## 2023-06-02 NOTE — Progress Notes (Signed)
 Internal Medicine Clinic Attending  Case discussed with the resident at the time of the visit.  We reviewed the resident's history and exam and pertinent patient test results.  I agree with the assessment, diagnosis, and plan of care documented in the resident's note.

## 2023-06-04 DIAGNOSIS — H52223 Regular astigmatism, bilateral: Secondary | ICD-10-CM | POA: Diagnosis not present

## 2023-06-04 DIAGNOSIS — H5211 Myopia, right eye: Secondary | ICD-10-CM | POA: Diagnosis not present

## 2023-06-04 DIAGNOSIS — H35363 Drusen (degenerative) of macula, bilateral: Secondary | ICD-10-CM | POA: Diagnosis not present

## 2023-06-04 DIAGNOSIS — E119 Type 2 diabetes mellitus without complications: Secondary | ICD-10-CM | POA: Diagnosis not present

## 2023-06-04 LAB — HM DIABETES EYE EXAM

## 2023-06-06 DIAGNOSIS — M79604 Pain in right leg: Secondary | ICD-10-CM | POA: Diagnosis not present

## 2023-06-06 DIAGNOSIS — J449 Chronic obstructive pulmonary disease, unspecified: Secondary | ICD-10-CM | POA: Diagnosis not present

## 2023-06-06 DIAGNOSIS — G4733 Obstructive sleep apnea (adult) (pediatric): Secondary | ICD-10-CM | POA: Diagnosis not present

## 2023-06-06 DIAGNOSIS — R296 Repeated falls: Secondary | ICD-10-CM | POA: Diagnosis not present

## 2023-06-17 ENCOUNTER — Other Ambulatory Visit: Payer: Self-pay
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
## 2023-06-17 NOTE — Patient Instructions (Signed)
 Visit Information  Thank you for taking time to visit with me today. Please don't hesitate to contact me if I can be of assistance to you before our next scheduled appointment.  Your next care management appointment is scheduled for:  07/22/23  130 pm  Please call the care guide team at [phone] if you need to cancel, schedule, or reschedule an appointment.   Please call [phone]-TALK (toll free, 24 hour hotline) if you are experiencing a Mental Health or Behavioral Health Crisis or need someone to talk to.  Augustin Leber RN, BSN, [facility] [place]  [facility]  Care Coordinator Phone: [phone]

## 2023-06-21 ENCOUNTER — Encounter: Payer: Self-pay | Admitting: Student in an Organized Health Care Education/Training Program

## 2023-06-25 DIAGNOSIS — J449 Chronic obstructive pulmonary disease, unspecified: Secondary | ICD-10-CM | POA: Diagnosis not present

## 2023-06-25 DIAGNOSIS — I5020 Unspecified systolic (congestive) heart failure: Secondary | ICD-10-CM | POA: Diagnosis not present

## 2023-06-25 DIAGNOSIS — G459 Transient cerebral ischemic attack, unspecified: Secondary | ICD-10-CM | POA: Diagnosis not present

## 2023-06-25 DIAGNOSIS — M1990 Unspecified osteoarthritis, unspecified site: Secondary | ICD-10-CM | POA: Diagnosis not present

## 2023-06-29 DIAGNOSIS — J449 Chronic obstructive pulmonary disease, unspecified: Secondary | ICD-10-CM | POA: Diagnosis not present

## 2023-06-29 DIAGNOSIS — M79604 Pain in right leg: Secondary | ICD-10-CM | POA: Diagnosis not present

## 2023-06-29 DIAGNOSIS — R296 Repeated falls: Secondary | ICD-10-CM | POA: Diagnosis not present

## 2023-06-29 DIAGNOSIS — G4733 Obstructive sleep apnea (adult) (pediatric): Secondary | ICD-10-CM | POA: Diagnosis not present

## 2023-07-07 DIAGNOSIS — J449 Chronic obstructive pulmonary disease, unspecified: Secondary | ICD-10-CM | POA: Diagnosis not present

## 2023-07-07 DIAGNOSIS — M79604 Pain in right leg: Secondary | ICD-10-CM | POA: Diagnosis not present

## 2023-07-07 DIAGNOSIS — G4733 Obstructive sleep apnea (adult) (pediatric): Secondary | ICD-10-CM | POA: Diagnosis not present

## 2023-07-07 DIAGNOSIS — R296 Repeated falls: Secondary | ICD-10-CM | POA: Diagnosis not present

## 2023-07-20 ENCOUNTER — Ambulatory Visit (HOSPITAL_COMMUNITY)
Admission: RE | Admit: 2023-07-20 | Discharge: 2023-07-20 | Disposition: A | Discharge status: Home / Self Care | Source: Ambulatory Visit | Attending: Internal Medicine | Admitting: Internal Medicine

## 2023-07-20 DIAGNOSIS — J84112 Idiopathic pulmonary fibrosis: Secondary | ICD-10-CM | POA: Insufficient documentation

## 2023-07-20 LAB — PULMONARY FUNCTION TEST
DL/VA % pred: 64 %
DL/VA: 2.57 ml/min/mmHg/L
DLCO unc % pred: 35 %
DLCO unc: 6.82 ml/min/mmHg
FEF 25-75 Pre: 1.36 L/s
FEF2575-%Pred-Pre: 115 %
FEV1-%Pred-Pre: 74 %
FEV1-Pre: 1.39 L
FEV1FVC-%Pred-Pre: 111 %
FEV6-%Pred-Pre: 72 %
FEV6-Pre: 1.72 L
FEV6FVC-%Pred-Pre: 106 %
FVC-%Pred-Pre: 68 %
FVC-Pre: 1.72 L
Pre FEV1/FVC ratio: 81 %
Pre FEV6/FVC Ratio: 100 %

## 2023-07-22 ENCOUNTER — Telehealth: Payer: Self-pay

## 2023-07-26 DIAGNOSIS — J449 Chronic obstructive pulmonary disease, unspecified: Secondary | ICD-10-CM | POA: Diagnosis not present

## 2023-07-26 DIAGNOSIS — G459 Transient cerebral ischemic attack, unspecified: Secondary | ICD-10-CM | POA: Diagnosis not present

## 2023-07-26 DIAGNOSIS — I5020 Unspecified systolic (congestive) heart failure: Secondary | ICD-10-CM | POA: Diagnosis not present

## 2023-07-26 DIAGNOSIS — M1990 Unspecified osteoarthritis, unspecified site: Secondary | ICD-10-CM | POA: Diagnosis not present

## 2023-07-28 ENCOUNTER — Emergency Department (HOSPITAL_COMMUNITY)

## 2023-07-28 ENCOUNTER — Encounter (HOSPITAL_COMMUNITY): Payer: Self-pay

## 2023-07-28 ENCOUNTER — Other Ambulatory Visit: Payer: Self-pay

## 2023-07-28 ENCOUNTER — Emergency Department (HOSPITAL_COMMUNITY)
Admission: EM | Admit: 2023-07-28 | Discharge: 2023-07-28 | Disposition: A | Discharge status: Home / Self Care | Attending: Emergency Medicine | Admitting: Emergency Medicine

## 2023-07-28 DIAGNOSIS — S0990XA Unspecified injury of head, initial encounter: Secondary | ICD-10-CM | POA: Diagnosis not present

## 2023-07-28 DIAGNOSIS — W19XXXA Unspecified fall, initial encounter: Secondary | ICD-10-CM

## 2023-07-28 DIAGNOSIS — R609 Edema, unspecified: Secondary | ICD-10-CM | POA: Diagnosis not present

## 2023-07-28 DIAGNOSIS — T148XXA Other injury of unspecified body region, initial encounter: Secondary | ICD-10-CM

## 2023-07-28 DIAGNOSIS — S80211A Abrasion, right knee, initial encounter: Secondary | ICD-10-CM | POA: Diagnosis not present

## 2023-07-28 DIAGNOSIS — Z043 Encounter for examination and observation following other accident: Secondary | ICD-10-CM | POA: Diagnosis not present

## 2023-07-28 DIAGNOSIS — W0110XA Fall on same level from slipping, tripping and stumbling with subsequent striking against unspecified object, initial encounter: Secondary | ICD-10-CM | POA: Insufficient documentation

## 2023-07-28 DIAGNOSIS — G44309 Post-traumatic headache, unspecified, not intractable: Secondary | ICD-10-CM | POA: Diagnosis not present

## 2023-07-28 DIAGNOSIS — J439 Emphysema, unspecified: Secondary | ICD-10-CM | POA: Diagnosis not present

## 2023-07-28 DIAGNOSIS — M542 Cervicalgia: Secondary | ICD-10-CM | POA: Diagnosis not present

## 2023-07-28 DIAGNOSIS — M85861 Other specified disorders of bone density and structure, right lower leg: Secondary | ICD-10-CM | POA: Diagnosis not present

## 2023-07-28 DIAGNOSIS — R55 Syncope and collapse: Secondary | ICD-10-CM | POA: Diagnosis not present

## 2023-07-28 DIAGNOSIS — M25561 Pain in right knee: Secondary | ICD-10-CM

## 2023-07-28 DIAGNOSIS — R404 Transient alteration of awareness: Secondary | ICD-10-CM | POA: Diagnosis not present

## 2023-07-28 LAB — URINALYSIS, ROUTINE W REFLEX MICROSCOPIC
Bilirubin Urine: NEGATIVE
Glucose, UA: >=500 mg/dL — AB
Hgb urine dipstick: NEGATIVE
Ketones, ur: NEGATIVE mg/dL
Leukocytes,Ua: NEGATIVE
Nitrite: NEGATIVE
Protein, ur: NEGATIVE mg/dL
Specific Gravity, Urine: 1.01 (ref 1.005–1.030)
pH: 5 (ref 5.0–8.0)

## 2023-07-28 LAB — COMPREHENSIVE METABOLIC PANEL WITH GFR
ALT: 14 U/L (ref 0–44)
AST: 19 U/L (ref 15–41)
Albumin: 3.7 g/dL (ref 3.5–5.0)
Alkaline Phosphatase: 85 U/L (ref 38–126)
Anion gap: 9 (ref 5–15)
BUN: 18 mg/dL (ref 8–23)
CO2: 20 mmol/L — ABNORMAL LOW (ref 22–32)
Calcium: 10.4 mg/dL — ABNORMAL HIGH (ref 8.9–10.3)
Chloride: 112 mmol/L — ABNORMAL HIGH (ref 98–111)
Creatinine, Ser: 1.63 mg/dL — ABNORMAL HIGH (ref 0.44–1.00)
GFR, Estimated: 31 mL/min — ABNORMAL LOW (ref >60)
Glucose, Bld: 92 mg/dL (ref 70–99)
Potassium: 4.7 mmol/L (ref 3.5–5.1)
Sodium: 141 mmol/L (ref 135–145)
Total Bilirubin: 1.2 mg/dL (ref 0.0–1.2)
Total Protein: 7.1 g/dL (ref 6.5–8.1)

## 2023-07-28 LAB — CBC
HCT: 42.1 % (ref 36.0–46.0)
Hemoglobin: 12.9 g/dL (ref 12.0–15.0)
MCH: 30.1 pg (ref 26.0–34.0)
MCHC: 30.6 g/dL (ref 30.0–36.0)
MCV: 98.1 fL (ref 80.0–100.0)
Platelets: 164 10*3/uL (ref 150–400)
RBC: 4.29 MIL/uL (ref 3.87–5.11)
RDW: 16.5 % — ABNORMAL HIGH (ref 11.5–15.5)
WBC: 6.6 10*3/uL (ref 4.0–10.5)
nRBC: 0 % (ref 0.0–0.2)

## 2023-07-28 LAB — CBG MONITORING, ED: Glucose-Capillary: 97 mg/dL (ref 70–99)

## 2023-07-28 MED ORDER — ACETAMINOPHEN 500 MG PO TABS
1000.0000 mg | ORAL_TABLET | Freq: Once | ORAL | Status: AC
  Administered 2023-07-28: 1000 mg via ORAL
  Filled 2023-07-28: qty 2

## 2023-07-28 MED ORDER — BACITRACIN ZINC 500 UNIT/GM EX OINT
TOPICAL_OINTMENT | Freq: Two times a day (BID) | CUTANEOUS | Status: DC
  Administered 2023-07-28: 2 via TOPICAL
  Filled 2023-07-28: qty 1.8

## 2023-07-28 NOTE — ED Notes (Signed)
 Phlebotomy stuck patient, unable to obtain blood work per PBT NB

## 2023-07-28 NOTE — ED Provider Notes (Signed)
 [facility] DEPT [facility] Emergency Department Provider Note MRN:  [id number]  Arrival date & time: 07/28/23     Chief Complaint   Fall and Near Syncope   History of Present Illness   Caitlyn Aguirre is a 88 y.o. year-old female presents to the ED with chief complaint of fall when getting out of a car.  She states that she stumbled while getting out.  She landed on her knee and hit her head. She denies taking a blood thinner.  She denies LOC.  She states that after hitting her head, it did cause her to feel dizzy.  She denies any chest pain, SOB or abdominal pain.    History provided by patient.   Review of Systems  Pertinent positive and negative review of systems noted in HPI.    Physical Exam   Vitals:   07/28/23 2200 07/28/23 2230  BP: 136/87 (!) 144/88  Pulse: 90 90  Resp: (!) 25 (!) 25  Temp:    SpO2: 93% 96%    CONSTITUTIONAL:  non toxic-appearing, NAD NEURO:  Alert and oriented x 3, CN 3-12 grossly intact EYES:  eyes equal and reactive ENT/NECK:  Supple, no stridor  CARDIO:  normal rate, regular rhythm, appears well-perfused  PULM:  No respiratory distress, CTAB GI/GU:  non-distended, no focal abdominal tenderness MSK/SPINE:  No gross deformities, no edema, moves all extremities  SKIN:  no rash, minor abrasion to right anterior knee   *Additional and/or pertinent findings included in MDM below  Diagnostic and Interventional Summary    EKG Interpretation Date/Time:    Ventricular Rate:    PR Interval:    QRS Duration:    QT Interval:    QTC Calculation:   R Axis:      Text Interpretation:         Labs Reviewed  COMPREHENSIVE METABOLIC PANEL WITH GFR - Abnormal; Notable for the following components:      Result Value   Chloride 112 (*)    CO2 20 (*)    Creatinine, Ser 1.63 (*)    Calcium  10.4 (*)    GFR, Estimated 31 (*)    All other components within normal limits  CBC - Abnormal; Notable for the following components:   RDW 16.5  (*)    All other components within normal limits  URINALYSIS, ROUTINE W REFLEX MICROSCOPIC - Abnormal; Notable for the following components:   Glucose, UA >=500 (*)    Bacteria, UA RARE (*)    All other components within normal limits  CBG MONITORING, ED    DG Knee Complete 4 Views Right  Final Result    CT Head Wo Contrast  Final Result    CT Cervical Spine Wo Contrast  Final Result      Medications  bacitracin  ointment (2 Applications Topical Given 07/28/23 2308)  acetaminophen  (TYLENOL ) tablet 1,000 mg (1,000 mg Oral Given 07/28/23 2308)     Procedures  /  Critical Care Procedures  ED Course and Medical Decision Making  I have reviewed the triage vital signs, the nursing notes, and pertinent available records from the EMR.  Social Determinants Affecting Complexity of Care: Patient has no clinically significant social determinants affecting this chief complaint..   ED Course:    Medical Decision Making Patient here with a fall.  States that she stepped out of a car, tripped, and fell to the ground.  States that she hit her head and her right knee.  She did not lose consciousness.  She states that she did feel lightheaded after hitting her head.  She is not anticoagulated.  Imaging is reassuring.  Patient ambulates without any difficulty in the ED.  Bacitracin  applied to her right knee abrasion.  Recommend PCP follow-up for recheck in 1 week.  Return for new or worsening symptoms.  Patient understands and agrees with the plan.  Amount and/or Complexity of Data Reviewed Labs: ordered.  Risk OTC drugs.         Consultants: No consultations were needed in caring for this patient.   Treatment and Plan: I considered admission due to patient's initial presentation, but after considering the examination and diagnostic results, patient will not require admission and can be discharged with outpatient follow-up.    Final Clinical Impressions(s) / ED Diagnoses      ICD-10-CM   1. Fall, initial encounter  W19.XXXA     2. Injury of head, initial encounter  S09.90XA     3. Acute pain of right knee  M25.561     4. Abrasion  T14.Ashten.Benders       ED Discharge Orders     None         Discharge Instructions Discussed with and Provided to Patient:   Discharge Instructions   None      Vicky Charleston, PA-C 07/28/23 2336    Elnor Savant A, DO 07/31/23 [id number]
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
## 2023-07-28 NOTE — ED Notes (Signed)
 This RN unsuccessful at labs x 2
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
## 2023-07-28 NOTE — ED Notes (Signed)
Phlebotomy to bedside.  

## 2023-07-28 NOTE — ED Notes (Signed)
 Pt ambulated from room to restroom with minimal assistance and no assistive devices.

## 2023-07-29 ENCOUNTER — Other Ambulatory Visit: Payer: Self-pay

## 2023-07-30 DIAGNOSIS — M79604 Pain in right leg: Secondary | ICD-10-CM | POA: Diagnosis not present

## 2023-07-30 DIAGNOSIS — G4733 Obstructive sleep apnea (adult) (pediatric): Secondary | ICD-10-CM | POA: Diagnosis not present

## 2023-07-30 DIAGNOSIS — R296 Repeated falls: Secondary | ICD-10-CM | POA: Diagnosis not present

## 2023-07-30 DIAGNOSIS — J449 Chronic obstructive pulmonary disease, unspecified: Secondary | ICD-10-CM | POA: Diagnosis not present

## 2023-08-01 ENCOUNTER — Other Ambulatory Visit: Payer: Self-pay | Admitting: Student in an Organized Health Care Education/Training Program

## 2023-08-02 NOTE — Telephone Encounter (Signed)
 I called and spoke with pt to sch her appt. Pt is sch for 08/09/2023 @ 2:30 with Dr. Waymond.

## 2023-08-05 ENCOUNTER — Other Ambulatory Visit: Payer: Self-pay | Admitting: Student in an Organized Health Care Education/Training Program

## 2023-08-08 NOTE — Progress Notes (Deleted)
 Patient name: Caitlyn Aguirre Date of birth: 06/17/1935 Date of visit: 08/08/23  Type of visit: Established Patient Office Visit   Subjective   Chief concern: No chief complaint on file.   Caitlyn Aguirre is a 88 y.o. female with a PMHx of HFrEF, T2DM, CKD3, hypothyroidism, polymyalgia rheumatica, HTN, OSA who presents to [facility] clinic for ED follow up after being seen for fall. The patient fell while getting out of her car. She stumbled, landed on her knee, and hit her head. Denies LOC and does not use any blood thinners.   Today she is feeling ***  Follow up Emergency Department Visit  Patient was evaluated at [facility] ED and discharged on 07/28/23. She was treated for fall and near syncope. Treatment for this included bacitracin  for knee abrasion. No acute fracture of the knee or acute abnormality noted on CT head. She reports this condition is improved.  --------------------------------------------------------------------------------------------------------   Patient Active Problem List   Diagnosis Date Noted   Myalgia of muscle of neck 06/01/2023   Chronic HFrEF (heart failure with reduced ejection fraction) (HCC) 10/10/2022   ILD (interstitial lung disease) (HCC)    Tendinopathy of rotator cuff, left 12/16/2020   CKD (chronic kidney disease) stage 3, GFR 30-59 ml/min (HCC) 05/08/2019   High Risk for Falls 07/18/2015   GERD (gastroesophageal reflux disease) 06/16/2015   Urinary, incontinence, stress female 05/09/2015   Insomnia 05/09/2015   Eczema of hand 06/21/2013   Preventive measure 02/17/2013   Obstructive sleep apnea 12/25/2011   Hypothyroidism 01/21/2006   Depression 01/21/2006   Essential hypertension 01/21/2006   COPD (HCC) 01/21/2006   Osteoarthritis 01/21/2006   Polymyalgia rheumatica (HCC) 01/21/2006   Type 2 diabetes mellitus with other specified complication (HCC) 10/04/1998     Past Surgical History:  Procedure Laterality Date   CARDIOVASCULAR STRESS  TEST     unsure of date   CATARACT EXTRACTION W/ INTRAOCULAR LENS IMPLANT  11/2007   right eye/E-chart   CATARACT EXTRACTION W/PHACO  04/08/2011   Procedure: CATARACT EXTRACTION PHACO AND INTRAOCULAR LENS PLACEMENT (IOC);  Surgeon: Jestine Bunnell, MD;  Location: [facility] OR;  Service: Ophthalmology;  Laterality: Left;   CESAREAN SECTION  1957; 1958; 1959; 1960   CHOLECYSTECTOMY N/A 01/15/2016   Procedure: LAPAROSCOPIC CHOLECYSTECTOMY WITH INTRAOPERATIVE CHOLANGIOGRAM;  Surgeon: Lynwood Pina, MD;  Location: [facility] OR;  Service: General;  Laterality: N/A;   CORONARY ANGIOPLASTY WITH STENT PLACEMENT  1999   1   CORONARY ANGIOPLASTY WITH STENT PLACEMENT  2010   1   ERCP N/A 01/17/2016   Procedure: ENDOSCOPIC RETROGRADE CHOLANGIOPANCREATOGRAPHY (ERCP);  Surgeon: Norleen LOISE Kiang, MD;  Location: [facility] ENDOSCOPY;  Service: Endoscopy;  Laterality: N/A;   EYE SURGERY  05/2007   evacuation blood clot right eye/E-chart   INCISION AND DRAINAGE PERIRECTAL ABSCESS N/A 07/20/2019   Procedure: IRRIGATION AND DEBRIDEMENT PERIANAL ABSCESS;  Surgeon: Vernetta Berg, MD;  Location: [facility] ORS;  Service: General;  Laterality: N/A;   RIGHT HEART CATH N/A 10/12/2022   Procedure: RIGHT HEART CATH;  Surgeon: Mady Bruckner, MD;  Location: [facility] INVASIVE CV LAB;  Service: Cardiovascular;  Laterality: N/A;   TRACHEOSTOMY  1999   Secondary to prolonged resp. failure w/mechanical ventilation in 1998   TUBAL LIGATION  01/05/1959   US  ECHOCARDIOGRAPHY  2010    ROS  Current Outpatient Medications  Medication Instructions   Accu-Chek Softclix Lancets lancets Check 1 time a day as instructed   albuterol  (VENTOLIN  HFA) 108 (90 Base) MCG/ACT inhaler 1-2  puffs, Inhalation, Every 6 hours PRN   amLODipine  (NORVASC ) 5 mg, Daily   atorvastatin  (LIPITOR) 40 mg, Oral, Daily   Blood Glucose Monitoring Suppl (ACCU-CHEK GUIDE ME) w/Device KIT Check 1 times a day as instructed   diclofenac  Sodium (VOLTAREN ) 2 g, Topical, 4 times daily   furosemide   (LASIX ) 40 mg, Oral, Weekly, Take 40 mg (one tablet) twice weekly (Friday/Saturday) than once weekly   levothyroxine  (SYNTHROID ) 75 mcg, Oral, Daily   lidocaine  (LIDODERM ) 5 % 2 patches, Transdermal, Every 24 hours, Remove & Discard patch within 12 hours or as directed by MD   magnesium  oxide (MAG-OX) 400 mg, Oral, Daily   metFORMIN  (GLUCOPHAGE ) 1,000 mg, Oral, Daily with breakfast   omeprazole  (PRILOSEC) 20 mg, Oral, Daily   OXYGEN  2 L, Daily PRN   PARoxetine  (PAXIL ) 30 mg, Oral, Daily   potassium chloride  20 MEQ TBCR 20 mEq, Oral, Daily   sacubitril -valsartan  (ENTRESTO ) 49-51 MG 1 tablet, Oral, 2 times daily   tiotropium (SPIRIVA  HANDIHALER) 18 mcg, Inhalation, Daily, INHALE THE CONTENTS OF 1  CAPSULE BY MOUTH VIA  HANDIHALER DAILY Strength: 18 mcg    Social History   Tobacco Use   Smoking status: Former    Current packs/day: 0.00    Average packs/day: 1 pack/day for 20.0 years (20.0 ttl pk-yrs)    Types: Cigarettes    Start date: 02/02/1954    Quit date: 02/02/1974    Years since quitting: 49.5   Smokeless tobacco: Former    Types: Snuff, Chew  Vaping Use   Vaping status: Never Used  Substance Use Topics   Alcohol use: No    Alcohol/week: 0.0 standard drinks of alcohol    Comment: stopped all alcohol in 1976   Drug use: No      Objective  There were no vitals filed for this visit.There is no height or weight on file to calculate BMI.   Physical Exam  {Labs (Optional):23779}    Assessment & Plan  Problem List Items Addressed This Visit   None   No follow-ups on file.  Patient discussed with Dr. {[email or web address]:[id number]}, who also saw and evaluated the patient.  Murlean Seelye, MD [place] [facility]  PGY-1 08/08/2023, 6:21 AM

## 2023-08-09 ENCOUNTER — Encounter

## 2023-08-09 DIAGNOSIS — Z9181 History of falling: Secondary | ICD-10-CM

## 2023-08-15 NOTE — Progress Notes (Unsigned)
 Patient name: Caitlyn Aguirre Date of birth: 1935-04-23 Date of visit: 08/16/23  Type of visit: Established Patient Office Visit   Subjective   Chief concern:  Chief Complaint  Patient presents with   Medication Refill   Neck Pain    Caitlyn Aguirre is a 88 y.o. female with a PMHx of chronic HFrEF, COPD, T2DM, MI in 1999, polymyalgia rheumatica, osteoarthritis who presents to [facility] clinic for ED follow up after being seen for fall. Patient is also requesting refills of her medications.  Patient also complaining of chronic neck pain and head pain on the right side described as tightness in her neck. She is also still having left-sided knee pain. Tried taking motrin  and ibuprofen  which alleviates her pain. She takes 2 tablets every 8 hours. Knee pain/stiffness when she gets up which makes her feel unsteady and like she may fall. She uses a power wheelchair at home.  Of note the patient was recently seen at [facility] for a fall. Details are below.  Clemens while getting out of car - stumbled -not on blood thinners -denied LOC -imaging showed no acute abnormalities in spine/head -knee imaging with chronic degenerative changes (chondrocalcinosis), no acute fx  Follow up Emergency Department Visit  Patient was evaluated at [facility] and discharged on 07/28/23. She was treated for fall and knee abrasion. Treatment for this included supportive measures. She reports this condition is improved.  --------------------------------------------------------------------------------------------------------    Patient Active Problem List   Diagnosis Date Noted   Myalgia of muscle of neck 06/01/2023   Chronic HFrEF (heart failure with reduced ejection fraction) (HCC) 10/10/2022   ILD (interstitial lung disease) (HCC)    Tendinopathy of rotator cuff, left 12/16/2020   CKD (chronic kidney disease) stage 3, GFR 30-59 ml/min (HCC) 05/08/2019   High Risk for Falls 07/18/2015   GERD (gastroesophageal reflux  disease) 06/16/2015   Urinary, incontinence, stress female 05/09/2015   Insomnia 05/09/2015   Eczema of hand 06/21/2013   Preventive measure 02/17/2013   Obstructive sleep apnea 12/25/2011   Hypothyroidism 01/21/2006   Depression 01/21/2006   Essential hypertension 01/21/2006   COPD (HCC) 01/21/2006   Osteoarthritis 01/21/2006   Polymyalgia rheumatica (HCC) 01/21/2006   Type 2 diabetes mellitus with other specified complication (HCC) 10/04/1998     Past Surgical History:  Procedure Laterality Date   CARDIOVASCULAR STRESS TEST     unsure of date   CATARACT EXTRACTION W/ INTRAOCULAR LENS IMPLANT  11/2007   right eye/E-chart   CATARACT EXTRACTION W/PHACO  04/08/2011   Procedure: CATARACT EXTRACTION PHACO AND INTRAOCULAR LENS PLACEMENT (IOC);  Surgeon: Jestine Bunnell, MD;  Location: [facility] OR;  Service: Ophthalmology;  Laterality: Left;   CESAREAN SECTION  1957; 1958; 1959; 1960   CHOLECYSTECTOMY N/A 01/15/2016   Procedure: LAPAROSCOPIC CHOLECYSTECTOMY WITH INTRAOPERATIVE CHOLANGIOGRAM;  Surgeon: Lynwood Pina, MD;  Location: [facility] OR;  Service: General;  Laterality: N/A;   CORONARY ANGIOPLASTY WITH STENT PLACEMENT  1999   1   CORONARY ANGIOPLASTY WITH STENT PLACEMENT  2010   1   ERCP N/A 01/17/2016   Procedure: ENDOSCOPIC RETROGRADE CHOLANGIOPANCREATOGRAPHY (ERCP);  Surgeon: Norleen LOISE Kiang, MD;  Location: [facility] ENDOSCOPY;  Service: Endoscopy;  Laterality: N/A;   EYE SURGERY  05/2007   evacuation blood clot right eye/E-chart   INCISION AND DRAINAGE PERIRECTAL ABSCESS N/A 07/20/2019   Procedure: IRRIGATION AND DEBRIDEMENT PERIANAL ABSCESS;  Surgeon: Vernetta Berg, MD;  Location: [facility] ORS;  Service: General;  Laterality: N/A;   RIGHT HEART CATH N/A 10/12/2022  Procedure: RIGHT HEART CATH;  Surgeon: Mady Bruckner, MD;  Location: [facility] INVASIVE CV LAB;  Service: Cardiovascular;  Laterality: N/A;   TRACHEOSTOMY  1999   Secondary to prolonged resp. failure w/mechanical ventilation in 1998   TUBAL  LIGATION  01/05/1959   US  ECHOCARDIOGRAPHY  2010    Review of Systems  Constitutional:  Negative for fever.  HENT:  Negative for congestion and sore throat.   Respiratory:  Positive for cough. Negative for hemoptysis and shortness of breath.        + for DOE  Cardiovascular:  Negative for chest pain.  Musculoskeletal:  Positive for neck pain.    Current Outpatient Medications  Medication Instructions   Accu-Chek Softclix Lancets lancets Check 1 time a day as instructed   albuterol  (VENTOLIN  HFA) 108 (90 Base) MCG/ACT inhaler 1-2 puffs, Inhalation, Every 6 hours PRN   amLODipine  (NORVASC ) 5 mg, Daily   atorvastatin  (LIPITOR) 40 mg, Oral, Daily   Blood Glucose Monitoring Suppl (ACCU-CHEK GUIDE ME) w/Device KIT Check 1 times a day as instructed   diclofenac  Sodium (VOLTAREN ) 2 g, Topical, 4 times daily   ENTRESTO  49-51 MG 1 tablet, Oral, 2 times daily   furosemide  (LASIX ) 40 mg, Oral, Weekly, Take 40 mg (one tablet) twice weekly (Friday/Saturday) than once weekly   Jardiance  10 mg, Oral, Daily before breakfast   levothyroxine  (SYNTHROID ) 75 mcg, Oral, Daily   lidocaine  (LIDODERM ) 5 % 2 patches, Transdermal, Every 24 hours, Remove & Discard patch within 12 hours or as directed by MD   magnesium  oxide (MAG-OX) 400 mg, Oral, Daily   metFORMIN  (GLUCOPHAGE ) 1,000 mg, Oral, Daily with breakfast   methocarbamol  (ROBAXIN ) 500 mg, Oral, 2 times daily, Start taking just at bedtime.   omeprazole  (PRILOSEC) 20 mg, Oral, Daily   OXYGEN  2 L, Daily PRN   PARoxetine  (PAXIL ) 30 mg, Oral, Daily   potassium chloride  20 MEQ TBCR 20 mEq, Oral, Daily   tiotropium (SPIRIVA  HANDIHALER) 18 mcg, Inhalation, Daily, INHALE THE CONTENTS OF 1  CAPSULE BY MOUTH VIA  HANDIHALER DAILY Strength: 18 mcg    Social History   Tobacco Use   Smoking status: Former    Current packs/day: 0.00    Average packs/day: 1 pack/day for 20.0 years (20.0 ttl pk-yrs)    Types: Cigarettes    Start date: 02/02/1954    Quit date:  02/02/1974    Years since quitting: 49.5   Smokeless tobacco: Former    Types: Snuff, Chew  Vaping Use   Vaping status: Never Used  Substance Use Topics   Alcohol use: No    Alcohol/week: 0.0 standard drinks of alcohol    Comment: stopped all alcohol in 1976   Drug use: No      Objective  Today's Vitals   08/16/23 1330  BP: 133/75  Pulse: 94  Temp: 97.9 F (36.6 C)  TempSrc: Oral  SpO2: 93%  Weight: 174 lb 6.4 oz (79.1 kg)  Height: 5' 6 (1.676 m)  Body mass index is 28.15 kg/m.   Physical Exam Constitutional:      General: She is not in acute distress.    Appearance: Normal appearance. She is normal weight. She is not ill-appearing or diaphoretic.  HENT:     Head: Normocephalic and atraumatic.     Mouth/Throat:     Mouth: Mucous membranes are moist.     Pharynx: Oropharynx is clear.  Eyes:     General: No scleral icterus.    Extraocular Movements: Extraocular movements  intact.     Conjunctiva/sclera: Conjunctivae normal.     Pupils: Pupils are equal, round, and reactive to light.  Cardiovascular:     Rate and Rhythm: Normal rate and regular rhythm.     Heart sounds: Normal heart sounds.     Comments: + visible JVP noted Pulmonary:     Effort: Pulmonary effort is normal. No respiratory distress.     Breath sounds: Normal breath sounds. No stridor. No wheezing, rhonchi or rales.  Chest:     Chest wall: No tenderness.  Skin:    Coloration: Skin is not jaundiced or pale.     Findings: No bruising, erythema or rash.  Neurological:     Mental Status: She is alert and oriented to person, place, and time. Mental status is at baseline.     Gait: Gait normal.  Psychiatric:        Mood and Affect: Mood normal.        Behavior: Behavior normal.     Last CBC Lab Results  Component Value Date   WBC 6.6 07/28/2023   HGB 12.9 07/28/2023   HCT 42.1 07/28/2023   MCV 98.1 07/28/2023   MCH 30.1 07/28/2023   RDW 16.5 (H) 07/28/2023   PLT 164 07/28/2023   Last  metabolic panel Lab Results  Component Value Date   GLUCOSE 92 07/28/2023   NA 141 07/28/2023   K 4.7 07/28/2023   CL 112 (H) 07/28/2023   CO2 20 (L) 07/28/2023   BUN 18 07/28/2023   CREATININE 1.63 (H) 07/28/2023   GFRNONAA 31 (L) 07/28/2023   CALCIUM  10.4 (H) 07/28/2023   PHOS 2.5 10/15/2022   PROT 7.1 07/28/2023   ALBUMIN 3.7 07/28/2023   LABGLOB 2.8 10/01/2021   AGRATIO 1.5 10/01/2021   BILITOT 1.2 07/28/2023   ALKPHOS 85 07/28/2023   AST 19 07/28/2023   ALT 14 07/28/2023   ANIONGAP 9 07/28/2023   Last hemoglobin A1c Lab Results  Component Value Date   HGBA1C 6.6 (A) 08/16/2023        Assessment & Plan  Problem List Items Addressed This Visit       Respiratory   COPD (HCC) (Chronic)   Baseline O2 requirement is 2L [place]. Today she did not bring her oxygen  with her. Saturation 93% on RA. Patient states she is having a cough with sputum production but does not use her inhalers because she is out of medication. Counseled the pt on proper inhaler use and sent refills to her pharmacy. She is also requesting a smaller portable oxygen  device. She follows with Dr. Geronimo in the pulmonology clinic. On exam she is not having any wheezes. Denies fever, sore throat, or other symptoms of infectious process. Her symptoms are more in line with chronic respiratory disease in the setting of medication non-adherence. -Continue Spiriva  daily with Albuterol  inhaler as needed -Encouraged pt to continue using home O2 -Follow up with Dr. Geronimo      Relevant Medications   tiotropium (SPIRIVA  HANDIHALER) 18 MCG inhalation capsule   albuterol  (VENTOLIN  HFA) 108 (90 Base) MCG/ACT inhaler     Endocrine   Type 2 diabetes mellitus with other specified complication (HCC) (Chronic)   Patient reports good adherence to diabetes regimen. A1c today is 6.6%, slight increase from 6.4% on 03/22/23. No changes needed, at goal. -Current regimen: metformin  1000mg  daily and Jardiance  10mg  daily       Relevant Orders   POC Hbg A1C (Completed)     Other   High  Risk for Falls - Primary (Chronic)   PT evaluated patient in January 2025, recommendation is power wheelchair which she uses at home. Discussed options for her knee pain/stiffness including PT and steroid injection that could lead reduced fall risk. Patient would like to wait and think about it.      Myalgia of muscle of neck   Still having neck pain/tightness of neck muscles, notes it has worsened since last visit. Denies any new injuries. She has not tried PT for this problem. Has tried tylenol , voltaren  gel, lidocaine  patch without improvement. She notes taking ibuprofen /motrin  and receiving pain relief. -Counseled patient on stopping all NSAID use due to her CKD and heart disease -Offered referral to PT, patient would like to think about it -Robaxin  500mg  bid prn, starting at bedtime       Med refills:   Return in about 3 months (around 11/16/2023) for chronic condition follow up.  Patient discussed with Dr. Lovie, who was present in the room for presentation of the plan to the patient and her daughter.  Jessi Pitstick, MD [place] [facility]  PGY-1 08/16/2023, 2:47 PM

## 2023-08-16 ENCOUNTER — Ambulatory Visit (INDEPENDENT_AMBULATORY_CARE_PROVIDER_SITE_OTHER)

## 2023-08-16 VITALS — BP 133/75 | HR 94 | Temp 97.9°F | Ht 66.0 in | Wt 174.4 lb

## 2023-08-16 DIAGNOSIS — Z7984 Long term (current) use of oral hypoglycemic drugs: Secondary | ICD-10-CM

## 2023-08-16 DIAGNOSIS — E1169 Type 2 diabetes mellitus with other specified complication: Secondary | ICD-10-CM | POA: Diagnosis not present

## 2023-08-16 DIAGNOSIS — Z9181 History of falling: Secondary | ICD-10-CM | POA: Diagnosis not present

## 2023-08-16 DIAGNOSIS — M1712 Unilateral primary osteoarthritis, left knee: Secondary | ICD-10-CM

## 2023-08-16 DIAGNOSIS — M7918 Myalgia, other site: Secondary | ICD-10-CM | POA: Diagnosis not present

## 2023-08-16 DIAGNOSIS — J438 Other emphysema: Secondary | ICD-10-CM

## 2023-08-16 DIAGNOSIS — J449 Chronic obstructive pulmonary disease, unspecified: Secondary | ICD-10-CM | POA: Diagnosis not present

## 2023-08-16 LAB — POCT GLYCOSYLATED HEMOGLOBIN (HGB A1C): Hemoglobin A1C: 6.6 % — AB (ref 4.0–5.6)

## 2023-08-16 LAB — GLUCOSE, CAPILLARY: Glucose-Capillary: 124 mg/dL — ABNORMAL HIGH (ref 70–99)

## 2023-08-16 MED ORDER — METHOCARBAMOL 500 MG PO TABS
500.0000 mg | ORAL_TABLET | Freq: Two times a day (BID) | ORAL | 1 refills | Status: DC
Start: 2023-08-16 — End: 2023-10-19

## 2023-08-16 MED ORDER — ALBUTEROL SULFATE HFA 108 (90 BASE) MCG/ACT IN AERS: 1.0000 | INHALATION_SPRAY | Freq: Four times a day (QID) | RESPIRATORY_TRACT | 5 refills | Status: DC | PRN

## 2023-08-16 MED ORDER — TIOTROPIUM BROMIDE MONOHYDRATE 18 MCG IN CAPS: 18.0000 ug | ORAL_CAPSULE | Freq: Every day | RESPIRATORY_TRACT | 3 refills | Status: AC

## 2023-08-16 NOTE — Assessment & Plan Note (Addendum)
 Patient reports good adherence to diabetes regimen. A1c today is 6.6%, slight increase from 6.4% on 03/22/23. No changes needed, at goal. -Current regimen: metformin  1000mg  daily and Jardiance  10mg  daily

## 2023-08-16 NOTE — Patient Instructions (Addendum)
 Thank you, Ms.Adjoa S Bannister for allowing us  to provide your care today. Today we discussed the following:  - For your neck pain, please try the robaxin  (methocarbamol ) starting at night time. If this medicine does not help you after a few weeks, please stop the medication. - STOP TAKING NSAIDs (IBUPROFEN /MOTRIN /ALLEVE) AS IT CAN WORSEN YOUR HEART AND KIDNEY DISEASE - Physical therapy may help you improve mobility in your neck and knee which can decrease your risk of falls - Another option for your knee pain is a steroid injection - I have sent refills of your inhalers to your pharmacy - Your Hemoglobin A1c test was normal. Please continue taking your medications for your diabetes as prescribed.  I have ordered the following labs for you:  Lab Orders         Glucose, capillary         POC Hbg A1C      Tests ordered today:  None  Referrals ordered today:   Referral Orders  No referral(s) requested today     I have ordered the following medication/changed the following medications:   Stop the following medications: Medications Discontinued During This Encounter  Medication Reason   albuterol  (VENTOLIN  HFA) 108 (90 Base) MCG/ACT inhaler Reorder   tiotropium (SPIRIVA  HANDIHALER) 18 MCG inhalation capsule Reorder     Start the following medications: Meds ordered this encounter  Medications   methocarbamol  (ROBAXIN ) 500 MG tablet    Sig: Take 1 tablet (500 mg total) by mouth in the morning and at bedtime. Start taking just at bedtime.    Dispense:  60 tablet    Refill:  1   tiotropium (SPIRIVA  HANDIHALER) 18 MCG inhalation capsule    Sig: Place 1 capsule (18 mcg total) into inhaler and inhale daily. INHALE THE CONTENTS OF 1  CAPSULE BY MOUTH VIA  HANDIHALER DAILY Strength: 18 mcg    Dispense:  90 capsule    Refill:  3    Requesting 1 year supply   albuterol  (VENTOLIN  HFA) 108 (90 Base) MCG/ACT inhaler    Sig: Inhale 1-2 puffs into the lungs every 6 (six) hours as needed for  wheezing or shortness of breath.    Dispense:  18 g    Refill:  5     Follow up: 3 months for chronic conditions, please call the office for an appointment if you would like a steroid injection for your knee.   Remember:   Should you have any questions or concerns please call the Internal Medicine Clinic at [phone].     Keiron Iodice, MD [facility]

## 2023-08-16 NOTE — Assessment & Plan Note (Addendum)
 Still having neck pain/tightness of neck muscles, notes it has worsened since last visit. Denies any new injuries. She has not tried PT for this problem. Has tried tylenol , voltaren  gel, lidocaine  patch without improvement. She notes taking ibuprofen /motrin  and receiving pain relief. -Counseled patient on stopping all NSAID use due to her CKD and heart disease -Offered referral to PT, patient would like to think about it -Robaxin  500mg  bid prn, starting at bedtime

## 2023-08-16 NOTE — Assessment & Plan Note (Addendum)
 Symptomatic osteoarthritis in bilateral knees, left worse than right today. This is a chronic issue that has been well documented in the past. Her symptoms present as knee locking and pain with movement and while getting up from seated position. We discussed discontinuing NSAID use as she has CKD and HF. In the past, Dr. Jerrell has discussed switching paxil  for cymbalta, but patient says the paxil  is helping her so we will hold off on this change today. Offered her PT and steroid injections as other conservative measures do not help her as much. She is not a good candidate for surgery. Patient states that she wants to think about her options before pursuing anything.

## 2023-08-16 NOTE — Assessment & Plan Note (Addendum)
 Baseline O2 requirement is 2L [place]. Today she did not bring her oxygen  with her. Saturation 93% on RA. Patient states she is having a cough with sputum production but does not use her inhalers because she is out of medication. Counseled the pt on proper inhaler use and sent refills to her pharmacy. She is also requesting a smaller portable oxygen  device. She follows with Dr. Geronimo in the pulmonology clinic. On exam she is not having any wheezes. Denies fever, sore throat, or other symptoms of infectious process. Her symptoms are more in line with chronic respiratory disease in the setting of medication non-adherence. -Continue Spiriva  daily with Albuterol  inhaler as needed -Encouraged pt to continue using home O2 -Follow up with Dr. Geronimo

## 2023-08-16 NOTE — Assessment & Plan Note (Addendum)
 PT evaluated patient in January 2025, recommendation is power wheelchair which she uses at home. Patient states she feels unsteady and is afraid of falling due to her knee pain and locking. Discussed options for her knee pain/stiffness including PT and steroid injection that could lead reduced fall risk. Patient would like to wait and think about it.

## 2023-08-17 ENCOUNTER — Encounter: Payer: Self-pay | Admitting: *Deleted

## 2023-08-17 NOTE — Progress Notes (Signed)
 Internal Medicine Clinic Attending  I was physically present during the key portions of the resident provided service and participated in the medical decision making of patient's management care. I reviewed pertinent patient test results.  The assessment, diagnosis, and plan were formulated together and I agree with the documentation in the resident's note.  Caitlyn Clarity, MD    This is a tough situation. Caitlyn Aguirre has many serious chronic illnesses and also experiences chronic pain.  I agree that NSAIDs are not a safe option for her, and we counseled her to stop using oral NSAIDs for pain.  We weighed the risk/benefit of a low-dose muscle relaxant, and given that her pain is affecting her quality of life, and that it seems to have a muscular component (esp the neck pain), we're going to try a low dose Robaxin  at night. If this is ineffective, will stop it.

## 2023-08-17 NOTE — Addendum Note (Signed)
 Addended by: Dhara Schepp L on: 08/17/2023 09:00 AM   Modules accepted: Level of Service

## 2023-08-27 ENCOUNTER — Telehealth: Payer: Self-pay

## 2023-08-27 ENCOUNTER — Ambulatory Visit: Payer: Self-pay

## 2023-08-27 NOTE — Telephone Encounter (Signed)
 Patient was contact via telephone this am due to no showing her appointment today with Dr. Waymond.  Was able to speak with the patient and she was agreeable to reschedule for next Tuesday 08/31/23 at 3:15 pm.

## 2023-08-27 NOTE — Assessment & Plan Note (Deleted)
 Patient's pain likely due to chronic osteoarthritis in bilateral knees.  At last visit, we offered the patient PT and steroid injections as she is not a good candidate for surgery and other conservative measures did not help her.  NSAIDs are not a good choice in her due to her CKD.  Today she states ___

## 2023-08-27 NOTE — Progress Notes (Deleted)
 Patient name: Caitlyn Aguirre Date of birth: Jul 28, 1935 Date of visit: 08/27/23  Type of visit: Acute Office Visit  Subjective   Chief concern: No chief complaint on file.   Caitlyn Aguirre is a 88 y.o. female with a PMHx of HFrEF, COPD, T2DM with CKD3, MI in 1999, polymyalgia rheumatica, osteoarthritis who presents to [facility] clinic for evaluation of knee and leg pain.   Patient Active Problem List   Diagnosis Date Noted   Myalgia of muscle of neck 06/01/2023   Chronic HFrEF (heart failure with reduced ejection fraction) (HCC) 10/10/2022   ILD (interstitial lung disease) (HCC)    Tendinopathy of rotator cuff, left 12/16/2020   CKD (chronic kidney disease) stage 3, GFR 30-59 ml/min (HCC) 05/08/2019   High Risk for Falls 07/18/2015   GERD (gastroesophageal reflux disease) 06/16/2015   Urinary, incontinence, stress female 05/09/2015   Insomnia 05/09/2015   Eczema of hand 06/21/2013   Preventive measure 02/17/2013   Obstructive sleep apnea 12/25/2011   Hypothyroidism 01/21/2006   Depression 01/21/2006   Essential hypertension 01/21/2006   COPD (HCC) 01/21/2006   Osteoarthritis 01/21/2006   Polymyalgia rheumatica (HCC) 01/21/2006   Type 2 diabetes mellitus with other specified complication (HCC) 10/04/1998     Past Surgical History:  Procedure Laterality Date   CARDIOVASCULAR STRESS TEST     unsure of date   CATARACT EXTRACTION W/ INTRAOCULAR LENS IMPLANT  11/2007   right eye/E-chart   CATARACT EXTRACTION W/PHACO  04/08/2011   Procedure: CATARACT EXTRACTION PHACO AND INTRAOCULAR LENS PLACEMENT (IOC);  Surgeon: Jestine Bunnell, MD;  Location: [facility] OR;  Service: Ophthalmology;  Laterality: Left;   CESAREAN SECTION  1957; 1958; 1959; 1960   CHOLECYSTECTOMY N/A 01/15/2016   Procedure: LAPAROSCOPIC CHOLECYSTECTOMY WITH INTRAOPERATIVE CHOLANGIOGRAM;  Surgeon: Lynwood Pina, MD;  Location: [facility] OR;  Service: General;  Laterality: N/A;   CORONARY ANGIOPLASTY WITH STENT PLACEMENT  1999   1    CORONARY ANGIOPLASTY WITH STENT PLACEMENT  2010   1   ERCP N/A 01/17/2016   Procedure: ENDOSCOPIC RETROGRADE CHOLANGIOPANCREATOGRAPHY (ERCP);  Surgeon: Norleen LOISE Kiang, MD;  Location: [facility] ENDOSCOPY;  Service: Endoscopy;  Laterality: N/A;   EYE SURGERY  05/2007   evacuation blood clot right eye/E-chart   INCISION AND DRAINAGE PERIRECTAL ABSCESS N/A 07/20/2019   Procedure: IRRIGATION AND DEBRIDEMENT PERIANAL ABSCESS;  Surgeon: Vernetta Berg, MD;  Location: [facility] ORS;  Service: General;  Laterality: N/A;   RIGHT HEART CATH N/A 10/12/2022   Procedure: RIGHT HEART CATH;  Surgeon: Mady Bruckner, MD;  Location: [facility] INVASIVE CV LAB;  Service: Cardiovascular;  Laterality: N/A;   TRACHEOSTOMY  1999   Secondary to prolonged resp. failure w/mechanical ventilation in 1998   TUBAL LIGATION  01/05/1959   US  ECHOCARDIOGRAPHY  2010    ROS  Current Outpatient Medications  Medication Instructions   Accu-Chek Softclix Lancets lancets Check 1 time a day as instructed   albuterol  (VENTOLIN  HFA) 108 (90 Base) MCG/ACT inhaler 1-2 puffs, Inhalation, Every 6 hours PRN   amLODipine  (NORVASC ) 5 mg, Daily   atorvastatin  (LIPITOR) 40 mg, Oral, Daily   Blood Glucose Monitoring Suppl (ACCU-CHEK GUIDE ME) w/Device KIT Check 1 times a day as instructed   diclofenac  Sodium (VOLTAREN ) 2 g, Topical, 4 times daily   ENTRESTO  49-51 MG 1 tablet, Oral, 2 times daily   furosemide  (LASIX ) 40 mg, Oral, Weekly, Take 40 mg (one tablet) twice weekly (Friday/Saturday) than once weekly   Jardiance  10 mg, Oral, Daily before breakfast  levothyroxine  (SYNTHROID ) 75 mcg, Oral, Daily   lidocaine  (LIDODERM ) 5 % 2 patches, Transdermal, Every 24 hours, Remove & Discard patch within 12 hours or as directed by MD   magnesium  oxide (MAG-OX) 400 mg, Oral, Daily   metFORMIN  (GLUCOPHAGE ) 1,000 mg, Oral, Daily with breakfast   methocarbamol  (ROBAXIN ) 500 mg, Oral, 2 times daily, Start taking just at bedtime.   omeprazole  (PRILOSEC) 20 mg, Oral,  Daily   OXYGEN  2 L, Daily PRN   PARoxetine  (PAXIL ) 30 mg, Oral, Daily   potassium chloride  20 MEQ TBCR 20 mEq, Oral, Daily   tiotropium (SPIRIVA  HANDIHALER) 18 mcg, Inhalation, Daily, INHALE THE CONTENTS OF 1  CAPSULE BY MOUTH VIA  HANDIHALER DAILY Strength: 18 mcg    Social History   Tobacco Use   Smoking status: Former    Current packs/day: 0.00    Average packs/day: 1 pack/day for 20.0 years (20.0 ttl pk-yrs)    Types: Cigarettes    Start date: 02/02/1954    Quit date: 02/02/1974    Years since quitting: 49.5   Smokeless tobacco: Former    Types: Snuff, Chew  Vaping Use   Vaping status: Never Used  Substance Use Topics   Alcohol use: No    Alcohol/week: 0.0 standard drinks of alcohol    Comment: stopped all alcohol in 1976   Drug use: No      Objective  There were no vitals filed for this visit.There is no height or weight on file to calculate BMI.   Physical Exam: Constitutional: well-appearing, well-nourished; *** weight; no acute distress HENT: normocephalic atraumatic, mucous membranes moist Eyes: conjunctiva non-erythematous Cardiovascular: regular rate and rhythm, no m/r/g Pulmonary/Chest: normal work of breathing on room air, lungs clear to auscultation bilaterally Abdominal: soft, non-tender, non-distended MSK: normal bulk and tone Neurological: alert & oriented x 3, no focal deficit Skin: warm and dry Extremities: BLE without edema or erythema. Psych: normal mood and behavior  {Labs (Optional):23779}    Assessment & Plan  There are no diagnoses linked to this encounter.  No follow-ups on file.  MEDICARE PT NEEDS GC  Patient discussed with Dr. {imcattendings:33109}, who also saw and evaluated the patient.  Dreden Rivere, MD [facility]  PGY-1 08/27/2023, 8:03 AM

## 2023-08-28 ENCOUNTER — Other Ambulatory Visit: Payer: Self-pay | Admitting: Student in an Organized Health Care Education/Training Program

## 2023-08-28 DIAGNOSIS — R109 Unspecified abdominal pain: Secondary | ICD-10-CM

## 2023-08-31 ENCOUNTER — Telehealth: Payer: Self-pay

## 2023-08-31 ENCOUNTER — Telehealth: Payer: Self-pay | Admitting: *Deleted

## 2023-08-31 ENCOUNTER — Other Ambulatory Visit: Payer: Self-pay | Admitting: *Deleted

## 2023-08-31 ENCOUNTER — Ambulatory Visit: Admitting: Student

## 2023-08-31 VITALS — BP 130/70 | HR 55 | Temp 98.4°F | Wt 171.0 lb

## 2023-08-31 DIAGNOSIS — M150 Primary generalized (osteo)arthritis: Secondary | ICD-10-CM | POA: Diagnosis not present

## 2023-08-31 DIAGNOSIS — I5022 Chronic systolic (congestive) heart failure: Secondary | ICD-10-CM | POA: Diagnosis not present

## 2023-08-31 MED ORDER — FUROSEMIDE 40 MG PO TABS: 40.0000 mg | ORAL_TABLET | Freq: Every day | ORAL | 3 refills | Status: DC

## 2023-08-31 MED ORDER — TRIAMCINOLONE ACETONIDE 40 MG/ML IJ SUSP
40.0000 mg | Freq: Once | INTRAMUSCULAR | Status: AC
  Administered 2023-08-31: 40 mg via INTRA_ARTICULAR

## 2023-08-31 MED ORDER — FUROSEMIDE 40 MG PO TABS: 40.0000 mg | ORAL_TABLET | Freq: Every day | ORAL | Status: DC

## 2023-08-31 MED ORDER — LIDOCAINE HCL (PF) 1 % IJ SOLN
2.0000 mL | Freq: Once | INTRAMUSCULAR | Status: AC
  Administered 2023-08-31: 2 mL

## 2023-08-31 NOTE — Addendum Note (Signed)
 Addended by: ALVIA PLANAS D on: 08/31/2023 01:58 PM   Modules accepted: Orders, Level of Service

## 2023-08-31 NOTE — Patient Outreach (Deleted)
 Note entered in error

## 2023-08-31 NOTE — Patient Instructions (Signed)
 Visit Information  Thank you for taking time to visit with me today. Please don't hesitate to contact me if I can be of assistance to you before our next scheduled telephone appointment.  Our next appointment is by telephone on 09/21/2023  at 11:00 am  Please call the care guide team at [phone] if you need to cancel or reschedule your appointment.   Please call the Suicide and Crisis Lifeline: 988 call the [place]  National Suicide Prevention Lifeline: 517-374-9886 or TTY: 934-121-5085 TTY 985-065-5947) to talk to a trained counselor call 1-800-273-TALK (toll free, 24 hour hotline) if you are experiencing a Mental Health or Behavioral Health Crisis or need someone to talk to.  The patient verbalized understanding of instructions, educational materials, and care plan provided today and DECLINED offer to receive copy of patient instructions, educational materials, and care plan.   Telephone follow up appointment with care management team member scheduled for: The patient has been provided with contact information for the care management team and has been advised to call with any health related questions or concerns.    Olam Ku, RN, BSN [place]  [facility], [facility] RN Care Manager Direct Dial: [phone]  Fax: [phone]

## 2023-08-31 NOTE — Assessment & Plan Note (Addendum)
 Chronic, bilateral knees. NSAIDs are too dangerous given CKD and she her medical and cardiac risk profile is unfavorable for arthroplasty. She presents for knee injections today.
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
## 2023-08-31 NOTE — Progress Notes (Signed)
 Patient name: Caitlyn Aguirre Date of birth: 1935/02/10 Date of visit: 08/31/23  Subjective   Chief concern: I can't get up and down because of my knees  Both knees are painful, preventing her from moving around.  Her legs are more swollen than usual, she's using her Lasix  every other day most of the time.  She has occasional palpitations. They last a moment and go away on their own.  Review of Systems  Respiratory:  Positive for shortness of breath.   Cardiovascular:  Positive for palpitations, orthopnea and leg swelling.  Musculoskeletal:  Positive for joint pain and neck pain.    Current Outpatient Medications  Medication Instructions   Accu-Chek Softclix Lancets lancets Check 1 time a day as instructed   albuterol  (VENTOLIN  HFA) 108 (90 Base) MCG/ACT inhaler 1-2 puffs, Inhalation, Every 6 hours PRN   amLODipine  (NORVASC ) 5 mg, Daily   atorvastatin  (LIPITOR) 40 mg, Oral, Daily   Blood Glucose Monitoring Suppl (ACCU-CHEK GUIDE ME) w/Device KIT Check 1 times a day as instructed   diclofenac  Sodium (VOLTAREN ) 2 g, Topical, 4 times daily   ENTRESTO  49-51 MG 1 tablet, Oral, 2 times daily   furosemide  (LASIX ) 40 mg, Oral, Daily   Jardiance  10 mg, Oral, Daily before breakfast   levothyroxine  (SYNTHROID ) 75 mcg, Oral, Daily   lidocaine  (LIDODERM ) 5 % 2 patches, Transdermal, Every 24 hours, Remove & Discard patch within 12 hours or as directed by MD   magnesium  oxide (MAG-OX) 400 mg, Oral, Daily   metFORMIN  (GLUCOPHAGE ) 1,000 mg, Oral, Daily with breakfast   methocarbamol  (ROBAXIN ) 500 mg, Oral, 2 times daily, Start taking just at bedtime.   omeprazole  (PRILOSEC) 20 mg, Oral, Daily   OXYGEN  2 L, Daily PRN   PARoxetine  (PAXIL ) 30 mg, Oral, Daily   potassium chloride  20 MEQ TBCR 20 mEq, Oral, Daily   tiotropium (SPIRIVA  HANDIHALER) 18 mcg, Inhalation, Daily, INHALE THE CONTENTS OF 1  CAPSULE BY MOUTH VIA  HANDIHALER DAILY Strength: 18 mcg     Objective  Today's Vitals   08/31/23  1451  BP: 130/70  Pulse: (!) 55  Temp: 98.4 F (36.9 C)  SpO2: (!) 89%  Weight: 171 lb (77.6 kg)  Body mass index is 27.6 kg/m.   Physical Exam Constitutional:      General: She is not in acute distress.    Appearance: Normal appearance.  Neck:     Thyroid : No thyroid  mass, thyromegaly or thyroid  tenderness.     Vascular: No carotid bruit or JVD.  Cardiovascular:     Rate and Rhythm: Normal rate and regular rhythm.     Pulses: Normal pulses.     Heart sounds: No murmur heard. Pulmonary:     Breath sounds: Rales present. No wheezing.  Musculoskeletal:     Right lower leg: Edema present.     Left lower leg: Edema present.     Comments: Pitting edema of ankles  Lymphadenopathy:     Cervical: No cervical adenopathy.  Skin:    General: Skin is warm and dry.  Neurological:     Mental Status: She is alert. Mental status is at baseline.     Cranial Nerves: No facial asymmetry.     Motor: No tremor.  Psychiatric:        Mood and Affect: Mood normal.        Behavior: Behavior normal.      Assessment & Plan  Primary osteoarthritis involving multiple joints Assessment & Plan: Chronic, bilateral knees. NSAIDs  are too dangerous given CKD and she her medical and cardiac risk profile is unfavorable for arthroplasty. She presents for knee injections today.  Orders: -     Triamcinolone  Acetonide -     Lidocaine  HCl (PF) -     Triamcinolone  Acetonide -     Lidocaine  HCl (PF)  Chronic HFrEF (heart failure with reduced ejection fraction) (HCC) Assessment & Plan: She's somewhat volume overloaded with a mild acute on chronic hypoxic respiratory failure. Initially 89% as she was walking in, after putting her on clinic O2, she has stable O2 saturation ~93% on 2 liter/min, which she uses at home. She's a bit volume overloaded although weight is stable from last visit. She's at risk of decompensating into acute congestive heart failure but doesn't need hospitalization now. Recommend  increasing lasix  to 40 mg daily. Check BMP. Close follow-up, recommend coming back in a week.   Orders: -     Basic metabolic panel with GFR -     Furosemide ; Take 1 tablet (40 mg total) by mouth daily.    Return in about 1 week (around 09/07/2023) for follow-up of breathing difficulty.  Ozell Kung MD 08/31/2023, 10:02 PM

## 2023-08-31 NOTE — Assessment & Plan Note (Signed)
 She's somewhat volume overloaded with a mild acute on chronic hypoxic respiratory failure. Initially 89% as she was walking in, after putting her on clinic O2, she has stable O2 saturation ~93% on 2 liter/min, which she uses at home. She's a bit volume overloaded although weight is stable from last visit. She's at risk of decompensating into acute congestive heart failure but doesn't need hospitalization now. Recommend increasing lasix  to 40 mg daily. Check BMP. Close follow-up, recommend coming back in a week.

## 2023-08-31 NOTE — Patient Instructions (Signed)
 Return in about 1 week (around 09/07/2023) for follow-up of breathing difficulty.  Start taking furosemide  40 mg daily and return to the clinic in 1 week for a checkup.  Remember to bring all of the medications that you take (including over the counter medications and supplements) with you to every clinic visit.  This after visit summary is an important review of tests, referrals, and medication changes that were discussed during your visit. If you have questions or concerns, call [phone]. Outside of clinic business hours, call the main hospital at [phone] and ask the operator for the on-call internal medicine resident.   Ozell Kung MD 08/31/2023, 4:10 PM

## 2023-08-31 NOTE — Progress Notes (Signed)
 Joint Injection/Arthrocentesis  Date/Time: 08/31/2023 10:03 PM  Performed by: Norrine Sharper, MD Authorized by: Rosan Dayton BROCKS, DO  Indications: pain  Body area: knee Joint: right knee Local anesthesia used: yes  Anesthesia: Local anesthesia used: yes Local Anesthetic: lidocaine  1% without epinephrine   Sedation: Patient sedated: no  Preparation: Patient was prepped and draped in the usual sterile fashion. Needle gauge: 25. Ultrasound guidance: no Approach: anterior Triamcinolone  amount: 40 mg Lidocaine  1% amount: 2 mL Patient tolerance: patient tolerated the procedure well with no immediate complications

## 2023-08-31 NOTE — Progress Notes (Signed)
 Joint Injection/Arthrocentesis  Date/Time: 08/31/2023 10:04 PM  Performed by: Norrine Sharper, MD Authorized by: Rosan Dayton BROCKS, DO  Indications: pain  Body area: knee Joint: left knee Local anesthesia used: yes  Anesthesia: Local anesthesia used: yes Local Anesthetic: lidocaine  1% without epinephrine   Sedation: Patient sedated: no  Preparation: Patient was prepped and draped in the usual sterile fashion. Needle gauge: 25. Approach: anterior Triamcinolone  amount: 40 mg Lidocaine  1% amount: 2 mL Patient tolerance: patient tolerated the procedure well with no immediate complications

## 2023-08-31 NOTE — Progress Notes (Signed)
 This encounter was created in error - please disregard.

## 2023-09-01 ENCOUNTER — Ambulatory Visit: Payer: Self-pay | Admitting: Student

## 2023-09-01 LAB — BASIC METABOLIC PANEL WITH GFR
BUN/Creatinine Ratio: 12 (ref 12–28)
BUN: 16 mg/dL (ref 8–27)
CO2: 18 mmol/L — ABNORMAL LOW (ref 20–29)
Calcium: 10.2 mg/dL (ref 8.7–10.3)
Chloride: 103 mmol/L (ref 96–106)
Creatinine, Ser: 1.36 mg/dL — ABNORMAL HIGH (ref 0.57–1.00)
Glucose: 105 mg/dL — ABNORMAL HIGH (ref 70–99)
Potassium: 3.9 mmol/L (ref 3.5–5.2)
Sodium: 142 mmol/L (ref 134–144)
eGFR: 38 mL/min/1.73 — ABNORMAL LOW (ref >59)

## 2023-09-01 NOTE — Telephone Encounter (Signed)
 Continue lasix  40 mg daily this week and follow-up in clinic next week.  She's feeling much better today after bilateral knee injections.

## 2023-09-07 ENCOUNTER — Ambulatory Visit: Admitting: Student

## 2023-09-07 VITALS — BP 120/65 | HR 99 | Temp 97.7°F | Ht 66.0 in | Wt 168.1 lb

## 2023-09-07 DIAGNOSIS — M150 Primary generalized (osteo)arthritis: Secondary | ICD-10-CM

## 2023-09-07 DIAGNOSIS — F32A Depression, unspecified: Secondary | ICD-10-CM

## 2023-09-07 DIAGNOSIS — Z7189 Other specified counseling: Secondary | ICD-10-CM | POA: Diagnosis not present

## 2023-09-07 DIAGNOSIS — I5022 Chronic systolic (congestive) heart failure: Secondary | ICD-10-CM | POA: Diagnosis not present

## 2023-09-07 MED ORDER — PAROXETINE HCL 30 MG PO TABS: 30.0000 mg | ORAL_TABLET | Freq: Every day | ORAL | 3 refills | Status: DC

## 2023-09-07 MED ORDER — SACUBITRIL-VALSARTAN 49-51 MG PO TABS: 1.0000 | ORAL_TABLET | Freq: Two times a day (BID) | ORAL | 2 refills | Status: DC

## 2023-09-07 MED ORDER — FUROSEMIDE 40 MG PO TABS: 40.0000 mg | ORAL_TABLET | ORAL | Status: DC

## 2023-09-07 MED ORDER — EMPAGLIFLOZIN 10 MG PO TABS: 10.0000 mg | ORAL_TABLET | Freq: Every day | ORAL | 2 refills | Status: DC

## 2023-09-07 NOTE — Progress Notes (Signed)
 Internal Medicine Clinic Attending  Case discussed with the resident at the time of the visit.  We reviewed the resident's history and exam and pertinent patient test results.  I agree with the assessment, diagnosis, and plan of care documented in the resident's note.

## 2023-09-07 NOTE — Assessment & Plan Note (Addendum)
 Started conversation about advanced care planning. I don't think she has plans in place in the event her condition deteriorates. At our last visit I mentioned my intent to do everything possible to keep her out of the hospital which she agreed with. I fear she wouldn't do well if she were intubated or in the ICU. My sense from our initial conversation is that she wouldn't want these measures if she were unlikely to survive to leave the hospital. I think these measures would prolong suffering if her condition deteriorated to this point. I would recommend DNR/DNI for her. At her next visit, which will be an AWV, we'll pick up this conversation where we left off.

## 2023-09-07 NOTE — Assessment & Plan Note (Addendum)
 Much improved from last week. Weight down ~3 lbs. No hypoxia on room air. No signs of volume overload expect for some persistent crackles in the LLL which are non-specific in setting of ILD. I recommend going back to Lasix  40 mg every other day. She has a scale and will weigh herself. She'll take an extra Lasix  for weight gain of 3 lbs in a day or 5 lbs in a week, worsening leg swelling, or worsening shortness of breath. Refilling Entresto  and Jardiance  as well.

## 2023-09-07 NOTE — Patient Instructions (Signed)
 Return in about 3 months (around 12/08/2023) for routine preventive care.  Start taking Lasix  every other day, but take an extra tablet if you notice weight gain of 2-3 lbs in a day or 5 lbs in a week, leg swelling, or shortness of breath.  Remember to bring all of the medications that you take (including over the counter medications and supplements) with you to every clinic visit.  This after visit summary is an important review of tests, referrals, and medication changes that were discussed during your visit. If you have questions or concerns, call [phone]. Outside of clinic business hours, call the main hospital at [phone] and ask the operator for the on-call internal medicine resident.   Ozell Kung MD 09/07/2023, 2:46 PM

## 2023-09-07 NOTE — Assessment & Plan Note (Signed)
 Feeling a lot better after the knee injections last week. Return as needed for repeat corticosteroid knee injections. Avoid NSAIDs.

## 2023-09-07 NOTE — Assessment & Plan Note (Signed)
 Refilled paroxetine  today.

## 2023-09-07 NOTE — Progress Notes (Signed)
 Patient name: Caitlyn Aguirre Date of birth: March 20, 1935 Date of visit: 09/07/23  Subjective   Chief concern: knee pain follow-up  Last week presented for bilateral corticosteroid knee injections.  Was also volume overloaded.  Injections were administered.  Lasix  increased from 40 mg every other day to 40 mg daily.  Since then she feels much better in all respects - knees are better and she's moving around more, breathing is easier, ankles aren't swollen. She's very pleased.  Review of Systems  Respiratory:  Negative for shortness of breath.   Cardiovascular:  Positive for orthopnea.    Current Outpatient Medications  Medication Instructions   Accu-Chek Softclix Lancets lancets Check 1 time a day as instructed   albuterol  (VENTOLIN  HFA) 108 (90 Base) MCG/ACT inhaler 1-2 puffs, Inhalation, Every 6 hours PRN   amLODipine  (NORVASC ) 5 mg, Daily   atorvastatin  (LIPITOR) 40 mg, Oral, Daily   Blood Glucose Monitoring Suppl (ACCU-CHEK GUIDE ME) w/Device KIT Check 1 times a day as instructed   diclofenac  Sodium (VOLTAREN ) 2 g, Topical, 4 times daily   ENTRESTO  49-51 MG 1 tablet, Oral, 2 times daily   furosemide  (LASIX ) 40 mg, Oral, Daily   Jardiance  10 mg, Oral, Daily before breakfast   levothyroxine  (SYNTHROID ) 75 mcg, Oral, Daily   lidocaine  (LIDODERM ) 5 % 2 patches, Transdermal, Every 24 hours, Remove & Discard patch within 12 hours or as directed by MD   magnesium  oxide (MAG-OX) 400 mg, Oral, Daily   metFORMIN  (GLUCOPHAGE ) 1,000 mg, Oral, Daily with breakfast   methocarbamol  (ROBAXIN ) 500 mg, Oral, 2 times daily, Start taking just at bedtime.   omeprazole  (PRILOSEC) 20 mg, Oral, Daily   OXYGEN  2 L, Daily PRN   PARoxetine  (PAXIL ) 30 mg, Oral, Daily   potassium chloride  20 MEQ TBCR 20 mEq, Oral, Daily   tiotropium (SPIRIVA  HANDIHALER) 18 mcg, Inhalation, Daily, INHALE THE CONTENTS OF 1  CAPSULE BY MOUTH VIA  HANDIHALER DAILY Strength: 18 mcg     Objective  Today's Vitals    09/07/23 1408  BP: 120/65  Pulse: 99  Temp: 97.7 F (36.5 C)  TempSrc: Oral  SpO2: 96%  Weight: 168 lb 1.6 oz (76.2 kg)  Height: 5' 6 (1.676 m)  Body mass index is 27.13 kg/m.   Physical Exam Constitutional:      Appearance: Normal appearance.  Neck:     Vascular: No JVD.  Cardiovascular:     Rate and Rhythm: Normal rate and regular rhythm.  Pulmonary:     Effort: Pulmonary effort is normal. No respiratory distress.     Breath sounds: Rales (persistent crackles left lower lobe) present.  Musculoskeletal:     Right knee: No swelling.     Left knee: No swelling.     Right lower leg: No edema.     Left lower leg: No edema.  Skin:    General: Skin is warm and dry.  Neurological:     Mental Status: She is alert.     Cranial Nerves: No facial asymmetry.  Psychiatric:        Mood and Affect: Affect normal.        Speech: Speech normal.        Behavior: Behavior normal.      Assessment & Plan   Chronic HFrEF (heart failure with reduced ejection fraction) (HCC) Assessment & Plan: Much improved from last week. Weight down ~3 lbs. No hypoxia on room air. No signs of volume overload expect for some persistent crackles in the  LLL which are non-specific in setting of ILD. I recommend going back to Lasix  40 mg every other day. She has a scale and will weigh herself. She'll take an extra Lasix  for weight gain of 3 lbs in a day or 5 lbs in a week, worsening leg swelling, or worsening shortness of breath. Refilling Entresto  and Jardiance  as well.  Orders: -     Empagliflozin ; Take 1 tablet (10 mg total) by mouth daily before breakfast.  Dispense: 100 tablet; Refill: 2 -     Sacubitril -Valsartan ; Take 1 tablet by mouth 2 (two) times daily.  Dispense: 200 tablet; Refill: 2 -     Furosemide ; Take 1 tablet (40 mg total) by mouth every other day. Take an extra tablet if you notice weight gain of 3 lbs in a day, 5 lbs in a week, worsening leg swelling, or shortness of breath.  Depression,  unspecified depression type Assessment & Plan: Refilled paroxetine  today.  Orders: -     PARoxetine  HCl; Take 1 tablet (30 mg total) by mouth daily.  Dispense: 90 tablet; Refill: 3  Primary osteoarthritis involving multiple joints Assessment & Plan: Feeling a lot better after the knee injections last week. Return as needed for repeat corticosteroid knee injections. Avoid NSAIDs.   Advance care planning Assessment & Plan: Started conversation about advanced care planning. I don't think she has plans in place in the event her condition deteriorates. At our last visit I mentioned my intent to do everything possible to keep her out of the hospital which she agreed with. I fear she wouldn't do well if she were intubated or in the ICU. My sense from our initial conversation is that she wouldn't want these measures if she were unlikely to survive to leave the hospital. I think these measures would prolong suffering if her condition deteriorated to this point. I would recommend DNR/DNI for her. At her next visit, which will be an AWV, we'll pick up this conversation where we left off.     Return in about 3 months (around 12/08/2023) for routine preventive care.  Ozell Kung MD 09/07/2023, 5:17 PM

## 2023-09-08 ENCOUNTER — Telehealth: Payer: Self-pay | Admitting: *Deleted

## 2023-09-08 NOTE — Telephone Encounter (Signed)
 Return call to pt's daughter who stated the pharmacy is out of Entresto  until tomorrow after 12 noon. She was informd we do not have any samples.

## 2023-09-08 NOTE — Telephone Encounter (Signed)
 Copied from CRM [id number]. Topic: Clinical - Prescription Issue >> Sep 08, 2023  2:07 PM Caitlyn Aguirre wrote: Reason for CRM: medication not available sacubitril -valsartan  (ENTRESTO ) 49-51 MG.  She has missed two days.  Does the office have any samples available she can have. Daughter will come and pick up

## 2023-09-13 ENCOUNTER — Telehealth: Payer: Self-pay | Admitting: *Deleted

## 2023-09-13 DIAGNOSIS — I5022 Chronic systolic (congestive) heart failure: Secondary | ICD-10-CM

## 2023-09-13 NOTE — Telephone Encounter (Signed)
 Copied from CRM #[id number]. Topic: Clinical - Prescription Issue >> Sep 10, 2023  3:24 PM Mercer PEDLAR wrote: Reason for CRM: [name], patient's daughter calling regarding empagliflozin  (JARDIANCE ) 10 MG TABS tablet. She stated that it is not available at current [facility] on [address] and she would like it transferred to [facility] on [address]. [name] stated that her mother is out of meds and needs it transferred today if possible.   [id number] [address], [place] - [address] AT [facility] [address] [place] [address] Phone: [phone] Fax: [phone]

## 2023-09-13 NOTE — Telephone Encounter (Signed)
 I called Caitlyn Aguirre who stated she was able to get Jardiance  at [facility] on [address] on Saturday.

## 2023-09-21 ENCOUNTER — Encounter: Payer: Self-pay | Admitting: *Deleted

## 2023-09-21 ENCOUNTER — Telehealth: Payer: Self-pay | Admitting: *Deleted

## 2023-09-21 NOTE — Patient Instructions (Signed)
 Caitlyn Aguirre - I am sorry I was unable to reach you today for our scheduled appointment. I work with Shawn Sick, MD and am calling to support your healthcare needs. Please contact me at [phone] at your earliest convenience. I look forward to speaking with you soon.   Thank you,   Olam Ku, RN, BSN [place]  [facility], [facility] RN Care Manager Direct Dial: [phone]  Fax: [phone]

## 2023-09-29 ENCOUNTER — Ambulatory Visit: Payer: 59

## 2023-09-29 VITALS — Ht 68.0 in | Wt 168.0 lb

## 2023-09-29 DIAGNOSIS — Z0000 Encounter for general adult medical examination without abnormal findings: Secondary | ICD-10-CM

## 2023-09-29 NOTE — Patient Instructions (Addendum)
 Ms. Giesler , Thank you for taking time out of your busy schedule to complete your Annual Wellness Visit with me. I enjoyed our conversation and look forward to speaking with you again next year. I, as well as your care team,  appreciate your ongoing commitment to your health goals. Please review the following plan we discussed and let me know if I can assist you in the future. Your Game plan/ To Do List    Referrals: If you haven't heard from the office you've been referred to, please reach out to them at the phone provided.   Follow up Visits: We will see or speak with you next year for your Next Medicare AWV with our clinical staff Have you seen your provider in the last 6 months (3 months if uncontrolled diabetes)? Yes  Clinician Recommendations:  Aim for 30 minutes of exercise or brisk walking, 6-8 glasses of water, and 5 servings of fruits and vegetables each day.       This is a list of the screenings recommended for you:  Health Maintenance  Topic Date Due   Zoster (Shingles) Vaccine (1 of 2) Never done   DTaP/Tdap/Td vaccine (2 - Td or Tdap) 11/19/2020   COVID-19 Vaccine (1 - 2024-25 season) Never done   Complete foot exam   07/27/2023   Hemoglobin A1C  11/16/2023   Eye exam for diabetics  06/03/2024   Medicare Annual Wellness Visit  09/28/2024   Pneumococcal Vaccine for age over 45  Completed   DEXA scan (bone density measurement)  Completed   HPV Vaccine  Aged Out   Meningitis B Vaccine  Aged Out   Flu Shot  Discontinued    Advanced directives: (Declined) Advance directive discussed with you today. Even though you declined this today, please call our office should you change your mind, and we can give you the proper paperwork for you to fill out. Advance Care Planning is important because it:  [x]  Makes sure you receive the medical care that is consistent with your values, goals, and preferences  [x]  It provides guidance to your family and loved ones and reduces their  decisional burden about whether or not they are making the right decisions based on your wishes.  Follow the link provided in your after visit summary or read over the paperwork we have mailed to you to help you started getting your Advance Directives in place. If you need assistance in completing these, please reach out to us  so that we can help you!  See attachments for Preventive Care and Fall Prevention Tips.
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
## 2023-10-15 NOTE — Progress Notes (Signed)
 Internal Medicine Attending:  I reviewed the AWV findings of the medical professional who conducted the visit. I was present in the office suite and immediately available to provide assistance and direction throughout the time the service was provided.

## 2023-10-18 ENCOUNTER — Other Ambulatory Visit: Payer: Self-pay | Admitting: Student

## 2023-10-18 DIAGNOSIS — M7918 Myalgia, other site: Secondary | ICD-10-CM

## 2023-10-18 NOTE — Telephone Encounter (Signed)
 Medication has expired off of med list

## 2023-10-19 ENCOUNTER — Other Ambulatory Visit: Payer: Self-pay | Admitting: Student in an Organized Health Care Education/Training Program

## 2023-10-19 DIAGNOSIS — R109 Unspecified abdominal pain: Secondary | ICD-10-CM

## 2023-11-06 ENCOUNTER — Emergency Department (HOSPITAL_COMMUNITY)
Admission: EM | Admit: 2023-11-06 | Discharge: 2023-11-06 | Disposition: A | Discharge status: Home / Self Care | Attending: Emergency Medicine | Admitting: Emergency Medicine

## 2023-11-06 ENCOUNTER — Encounter (HOSPITAL_COMMUNITY): Payer: Self-pay | Admitting: *Deleted

## 2023-11-06 ENCOUNTER — Other Ambulatory Visit: Payer: Self-pay

## 2023-11-06 ENCOUNTER — Emergency Department (HOSPITAL_COMMUNITY)

## 2023-11-06 DIAGNOSIS — M160 Bilateral primary osteoarthritis of hip: Secondary | ICD-10-CM | POA: Diagnosis not present

## 2023-11-06 DIAGNOSIS — Z8673 Personal history of transient ischemic attack (TIA), and cerebral infarction without residual deficits: Secondary | ICD-10-CM | POA: Diagnosis not present

## 2023-11-06 DIAGNOSIS — M1712 Unilateral primary osteoarthritis, left knee: Secondary | ICD-10-CM | POA: Insufficient documentation

## 2023-11-06 DIAGNOSIS — Y92007 Garden or yard of unspecified non-institutional (private) residence as the place of occurrence of the external cause: Secondary | ICD-10-CM | POA: Diagnosis not present

## 2023-11-06 DIAGNOSIS — M353 Polymyalgia rheumatica: Secondary | ICD-10-CM | POA: Insufficient documentation

## 2023-11-06 DIAGNOSIS — Z79899 Other long term (current) drug therapy: Secondary | ICD-10-CM | POA: Diagnosis not present

## 2023-11-06 DIAGNOSIS — S20212A Contusion of left front wall of thorax, initial encounter: Secondary | ICD-10-CM | POA: Insufficient documentation

## 2023-11-06 DIAGNOSIS — W010XXA Fall on same level from slipping, tripping and stumbling without subsequent striking against object, initial encounter: Secondary | ICD-10-CM | POA: Insufficient documentation

## 2023-11-06 DIAGNOSIS — M25562 Pain in left knee: Secondary | ICD-10-CM | POA: Diagnosis not present

## 2023-11-06 DIAGNOSIS — J8410 Pulmonary fibrosis, unspecified: Secondary | ICD-10-CM | POA: Diagnosis not present

## 2023-11-06 DIAGNOSIS — J449 Chronic obstructive pulmonary disease, unspecified: Secondary | ICD-10-CM | POA: Insufficient documentation

## 2023-11-06 DIAGNOSIS — W19XXXA Unspecified fall, initial encounter: Secondary | ICD-10-CM

## 2023-11-06 DIAGNOSIS — I700 Atherosclerosis of aorta: Secondary | ICD-10-CM | POA: Diagnosis not present

## 2023-11-06 DIAGNOSIS — K5730 Diverticulosis of large intestine without perforation or abscess without bleeding: Secondary | ICD-10-CM | POA: Diagnosis not present

## 2023-11-06 DIAGNOSIS — E119 Type 2 diabetes mellitus without complications: Secondary | ICD-10-CM | POA: Insufficient documentation

## 2023-11-06 DIAGNOSIS — I517 Cardiomegaly: Secondary | ICD-10-CM | POA: Insufficient documentation

## 2023-11-06 DIAGNOSIS — I10 Essential (primary) hypertension: Secondary | ICD-10-CM | POA: Diagnosis not present

## 2023-11-06 DIAGNOSIS — J432 Centrilobular emphysema: Secondary | ICD-10-CM | POA: Diagnosis not present

## 2023-11-06 DIAGNOSIS — I2510 Atherosclerotic heart disease of native coronary artery without angina pectoris: Secondary | ICD-10-CM | POA: Insufficient documentation

## 2023-11-06 DIAGNOSIS — R519 Headache, unspecified: Secondary | ICD-10-CM | POA: Diagnosis present

## 2023-11-06 DIAGNOSIS — Z7984 Long term (current) use of oral hypoglycemic drugs: Secondary | ICD-10-CM | POA: Insufficient documentation

## 2023-11-06 DIAGNOSIS — S20219A Contusion of unspecified front wall of thorax, initial encounter: Secondary | ICD-10-CM

## 2023-11-06 DIAGNOSIS — K219 Gastro-esophageal reflux disease without esophagitis: Secondary | ICD-10-CM | POA: Insufficient documentation

## 2023-11-06 DIAGNOSIS — R0781 Pleurodynia: Secondary | ICD-10-CM | POA: Diagnosis present

## 2023-11-06 LAB — CBC
HCT: 39.6 % (ref 36.0–46.0)
Hemoglobin: 13 g/dL (ref 12.0–15.0)
MCH: 30.7 pg (ref 26.0–34.0)
MCHC: 32.8 g/dL (ref 30.0–36.0)
MCV: 93.4 fL (ref 80.0–100.0)
Platelets: 186 K/uL (ref 150–400)
RBC: 4.24 MIL/uL (ref 3.87–5.11)
RDW: 16.3 % — ABNORMAL HIGH (ref 11.5–15.5)
WBC: 6.9 K/uL (ref 4.0–10.5)
nRBC: 0 % (ref 0.0–0.2)

## 2023-11-06 LAB — BASIC METABOLIC PANEL WITH GFR
Anion gap: 13 (ref 5–15)
BUN: 21 mg/dL (ref 8–23)
CO2: 24 mmol/L (ref 22–32)
Calcium: 10.2 mg/dL (ref 8.9–10.3)
Chloride: 104 mmol/L (ref 98–111)
Creatinine, Ser: 1.44 mg/dL — ABNORMAL HIGH (ref 0.44–1.00)
GFR, Estimated: 35 mL/min — ABNORMAL LOW (ref >60)
Glucose, Bld: 84 mg/dL (ref 70–99)
Potassium: 4.2 mmol/L (ref 3.5–5.1)
Sodium: 141 mmol/L (ref 135–145)

## 2023-11-06 LAB — I-STAT CHEM 8, ED
BUN: 24 mg/dL — ABNORMAL HIGH (ref 8–23)
Calcium, Ion: 1.3 mmol/L (ref 1.15–1.40)
Chloride: 106 mmol/L (ref 98–111)
Creatinine, Ser: 1.6 mg/dL — ABNORMAL HIGH (ref 0.44–1.00)
Glucose, Bld: 90 mg/dL (ref 70–99)
HCT: 41 % (ref 36.0–46.0)
Hemoglobin: 13.9 g/dL (ref 12.0–15.0)
Potassium: 4.5 mmol/L (ref 3.5–5.1)
Sodium: 143 mmol/L (ref 135–145)
TCO2: 28 mmol/L (ref 22–32)

## 2023-11-06 MED ORDER — LIDOCAINE 5 % EX PTCH: 1.0000 | MEDICATED_PATCH | CUTANEOUS | 0 refills | Status: DC

## 2023-11-06 MED ORDER — ONDANSETRON HCL 4 MG/2ML IJ SOLN
4.0000 mg | Freq: Once | INTRAMUSCULAR | Status: DC
Start: 2023-11-06 — End: 2023-11-07

## 2023-11-06 MED ORDER — OXYCODONE-ACETAMINOPHEN 5-325 MG PO TABS: 1.0000 | ORAL_TABLET | Freq: Four times a day (QID) | ORAL | 0 refills | Status: DC | PRN

## 2023-11-06 MED ORDER — LIDOCAINE 5 % EX PTCH
1.0000 | MEDICATED_PATCH | CUTANEOUS | 0 refills | Status: DC
Start: 2023-11-06 — End: 2023-11-06

## 2023-11-06 MED ORDER — IOHEXOL 350 MG/ML SOLN
75.0000 mL | Freq: Once | INTRAVENOUS | Status: AC | PRN
  Administered 2023-11-06: 75 mL via INTRAVENOUS

## 2023-11-06 MED ORDER — OXYCODONE-ACETAMINOPHEN 5-325 MG PO TABS
1.0000 | ORAL_TABLET | Freq: Once | ORAL | Status: AC
  Administered 2023-11-06: 1 via ORAL
  Filled 2023-11-06: qty 1

## 2023-11-06 MED ORDER — MORPHINE SULFATE (PF) 4 MG/ML IV SOLN: 4.0000 mg | Freq: Once | INTRAVENOUS | Status: DC

## 2023-11-06 MED ORDER — OXYCODONE-ACETAMINOPHEN 5-325 MG PO TABS: 1.0000 | ORAL_TABLET | Freq: Four times a day (QID) | ORAL | 0 refills | Status: AC | PRN

## 2023-11-06 NOTE — ED Triage Notes (Signed)
 The pt arrived by [facility] from home she tripped and fell approx one hour ago  she is c/o lt rib pain and her lt knee  which has an abrasion on it   she is also c/o some head pain

## 2023-11-06 NOTE — ED Provider Notes (Signed)
 [place] EMERGENCY DEPARTMENT AT [facility] Provider Note   CSN: [id number] Arrival date & time: 11/06/23  1519     Patient presents with: [place]   Caitlyn Aguirre is a 88 y.o. female.    Fall     Patient has a history of COPD acid reflux hypertension polymyalgia rheumatica cardiomyopathy prior stroke, diabetes, coronary artery disease.  Patient presents ED for evaluation after a fall.  Patient was at a yard sale when she tripped because she did not realize there was a step.  She fell landing primarily on her left side.  She is having pain mostly below her left breast where she landed and also on her left knee.  She denies any shortness of breath.  No abdominal pain.  Prior to Admission medications   Medication Sig Start Date End Date Taking? Authorizing Provider  lidocaine  (LIDODERM ) 5 % Place 1 patch onto the skin daily. Remove & Discard patch within 12 hours or as directed by MD 11/06/23  Yes Randol Simmonds, MD  oxyCODONE -acetaminophen  (PERCOCET/ROXICET) 5-325 MG tablet Take 1 tablet by mouth every 6 (six) hours as needed for severe pain (pain score 7-10). 11/06/23  Yes Randol Simmonds, MD  Accu-Chek Softclix Lancets lancets Check 1 time a day as instructed 06/28/19   Jerrell Cleatus Ned, MD  albuterol  (VENTOLIN  HFA) 108 [phone] Base) MCG/ACT inhaler Inhale 1-2 puffs into the lungs every 6 (six) hours as needed for wheezing or shortness of breath. 08/16/23   Tawkaliyar, Roya, DO  amLODipine  (NORVASC ) 5 MG tablet Take 5 mg by mouth daily. 12/16/22   [provider]  atorvastatin  (LIPITOR) 40 MG tablet Take 1 tablet (40 mg total) by mouth daily. 01/14/23   Jerrell Cleatus Ned, MD  Blood Glucose Monitoring Suppl (ACCU-CHEK GUIDE ME) w/Device KIT Check 1 times a day as instructed 06/28/19   Vincent, Duncan Thomas, MD  diclofenac  Sodium (VOLTAREN ) 1 % GEL Apply 2 g topically 4 (four) times daily. 05/31/23   Tawkaliyar, Roya, DO  empagliflozin  (JARDIANCE ) 10 MG TABS tablet Take 1  tablet (10 mg total) by mouth daily before breakfast. 09/07/23   Norrine Sharper, MD  furosemide  (LASIX ) 40 MG tablet Take 1 tablet (40 mg total) by mouth every other day. Take an extra tablet if you notice weight gain of 3 lbs in a day, 5 lbs in a week, worsening leg swelling, or shortness of breath. 09/07/23   Norrine Sharper, MD  levothyroxine  (SYNTHROID ) 75 MCG tablet TAKE 1 TABLET BY MOUTH DAILY 08/02/23   Lovie Clarity, MD  magnesium  oxide (MAG-OX) 400 (240 Mg) MG tablet Take 1 tablet (400 mg total) by mouth daily. 11/05/22   Goodrich, Callie E, PA-C  metFORMIN  (GLUCOPHAGE ) 1000 MG tablet TAKE 1 TABLET BY MOUTH DAILY  WITH BREAKFAST 02/04/23   Jerrell Cleatus Ned, MD  methocarbamol  (ROBAXIN ) 500 MG tablet TAKE 1 TABLET BY MOUTH EVERY NIGHT AT BEDTIME. MAY INCREASE TO 1 TABLET EVERY MORNING AND EVERY NIGHT AT BEDTIME AS NEEDED 10/19/23   Shawn Sick, MD  omeprazole  (PRILOSEC) 20 MG capsule TAKE 1 CAPSULE BY MOUTH DAILY 03/15/23   Jerrell Cleatus Ned, MD  OXYGEN  Inhale 2 L into the lungs daily as needed (for breathing).     [provider]  PARoxetine  (PAXIL ) 30 MG tablet Take 1 tablet (30 mg total) by mouth daily. 09/07/23   Norrine Sharper, MD  potassium chloride  20 MEQ TBCR Take 1 tablet (20 mEq total) by mouth daily for 3 days. 04/15/23 04/18/23  Tharon Lung, MD  sacubitril -valsartan  (ENTRESTO ) 49-51 MG Take 1 tablet by mouth 2 (two) times daily. 09/07/23   McLendon, Michael, MD  tiotropium (SPIRIVA  HANDIHALER) 18 MCG inhalation capsule Place 1 capsule (18 mcg total) into inhaler and inhale daily. INHALE THE CONTENTS OF 1  CAPSULE BY MOUTH VIA  HANDIHALER DAILY Strength: 18 mcg 08/16/23   Tawkaliyar, Roya, DO    Allergies: Patient has no known allergies.    Review of Systems  Updated Vital Signs BP 128/83   Pulse 80   Temp 98 F (36.7 C) (Oral)   Resp 18   Ht 1.727 m (5' 8)   Wt 76.2 kg   SpO2 99%   BMI 25.54 kg/m   Physical Exam Vitals and nursing note reviewed.   Constitutional:      Appearance: She is well-developed. She is ill-appearing.     Comments: Appears to be in pain holding her left chest  HENT:     Head: Normocephalic and atraumatic.     Right Ear: External ear normal.     Left Ear: External ear normal.  Eyes:     General: No scleral icterus.       Right eye: No discharge.        Left eye: No discharge.     Conjunctiva/sclera: Conjunctivae normal.  Neck:     Trachea: No tracheal deviation.  Cardiovascular:     Rate and Rhythm: Normal rate and regular rhythm.  Pulmonary:     Effort: Pulmonary effort is normal. No respiratory distress.     Breath sounds: Normal breath sounds. No stridor. No wheezing or rales.     Comments: , No crepitus Chest:     Chest wall: Tenderness present.  Abdominal:     General: Bowel sounds are normal. There is no distension.     Palpations: Abdomen is soft.     Tenderness: There is no abdominal tenderness. There is no guarding or rebound.     Comments: mL transfer patient left upper quadrant  Musculoskeletal:        General: No tenderness or deformity.     Cervical back: Neck supple.  Skin:    General: Skin is warm and dry.     Findings: No rash.  Neurological:     General: No focal deficit present.     Mental Status: She is alert.     Cranial Nerves: No cranial nerve deficit, dysarthria or facial asymmetry.     Sensory: No sensory deficit.     Motor: No abnormal muscle tone or seizure activity.     Coordination: Coordination normal.  Psychiatric:        Mood and Affect: Mood normal.     (all labs ordered are listed, but only abnormal results are displayed) Labs Reviewed  CBC - Abnormal; Notable for the following components:      Result Value   RDW 16.3 (*)    All other components within normal limits  BASIC METABOLIC PANEL WITH GFR - Abnormal; Notable for the following components:   Creatinine, Ser 1.44 (*)    GFR, Estimated 35 (*)    All other components within normal limits  I-STAT  CHEM 8, ED - Abnormal; Notable for the following components:   BUN 24 (*)    Creatinine, Ser 1.60 (*)    All other components within normal limits    EKG: EKG Interpretation Date/Time:  Saturday November 06 2023 17:56:34 EDT Ventricular Rate:  83 PR Interval:  183 QRS  Duration:  82 QT Interval:  399 QTC Calculation: 469 R Axis:   89  Text Interpretation: Sinus rhythm Multiform ventricular premature complexes Borderline right axis deviation Borderline ST depression, inferior leads No significant change since last tracing Confirmed by Randol Simmonds [phone]) on 11/06/2023 6:03:41 PM  Radiology: CT CHEST ABDOMEN PELVIS W CONTRAST Result Date: 11/06/2023 CLINICAL DATA:  Trauma fall EXAM: CT HEAD AND CERVICAL SPINE WITHOUT CONTRAST CT CHEST, ABDOMEN, AND PELVIS WITH CONTRAST TECHNIQUE: Multidetector CT imaging of the head and cervical spine was performed following the standard protocol during bolus administration of intravenous contrast. Multidetector CT imaging of the chest, abdomen and pelvis was performed following the standard protocol during bolus administration of intravenous contrast. RADIATION DOSE REDUCTION: This exam was performed according to the departmental dose-optimization program which includes automated exposure control, adjustment of the mA and/or kV according to patient size and/or use of iterative reconstruction technique. CONTRAST:  75mL OMNIPAQUE  IOHEXOL  350 MG/ML SOLN COMPARISON:  07/28/2023 FINDINGS: CT HEAD FINDINGS Brain: No evidence of acute infarction, hemorrhage, hydrocephalus, extra-axial collection or mass lesion/mass effect. Vascular: No hyperdense vessel or unexpected calcification. Skull: Normal. Negative for fracture or focal lesion. Sinuses/Orbits: No acute finding. Other: None. CT CERVICAL SPINE FINDINGS Alignment: Normal. Skull base and vertebrae: No acute fracture. No primary bone lesion or focal pathologic process. Soft tissues and spinal canal: No prevertebral fluid  or swelling. No visible canal hematoma. Disc levels: Generally mild multilevel disc space height loss and osteophytosis throughout the cervical spine with sizable anterior bridging osteophytes, particularly at C5-C6 (series 11, image 39). Upper chest: Negative. Other: None. CT CHEST FINDINGS Cardiovascular: No significant vascular findings. Cardiomegaly. Gross enlargement of the main pulmonary artery measuring up to 4.2 cm in caliber. No pericardial effusion. Mediastinum/Nodes: No enlarged mediastinal, hilar, or axillary lymph nodes. Thyroid  gland, trachea, and esophagus demonstrate no significant findings. Lungs/Pleura: No significant change in bibasilar predominant UIP pattern pulmonary fibrosis moderate underlying centrilobular and paraseptal emphysema. No pleural effusion or pneumothorax. Musculoskeletal: No chest wall abnormality. No acute osseous findings. Disc degenerative disease and bridging osteophytosis throughout the thoracic and upper lumbar spine, in keeping with DISH. Chronic fracture deformity of the sternal body. CT ABDOMEN PELVIS FINDINGS Hepatobiliary: No focal liver abnormality is seen. Status post cholecystectomy. Postoperative pneumobilia. Pancreas: Unremarkable. No pancreatic ductal dilatation or surrounding inflammatory changes. Spleen: Normal in size without significant abnormality. Adrenals/Urinary Tract: Adrenal glands are unremarkable. Kidneys are normal, without renal calculi, solid lesion, or hydronephrosis. Bladder is unremarkable. Stomach/Bowel: Stomach is within normal limits. Appendix appears normal. No evidence of bowel wall thickening, distention, or inflammatory changes. Pancolonic diverticulosis. Vascular/Lymphatic: Aortic atherosclerosis. No enlarged abdominal or pelvic lymph nodes. Reproductive: No mass or other abnormality. Other: No abdominal wall hernia or abnormality. No ascites. Musculoskeletal: No acute osseous findings. IMPRESSION: 1. No acute intracranial pathology. 2.  No fracture or static subluxation of the cervical spine. 3. No CT evidence of acute traumatic injury to the chest, abdomen, or pelvis. 4. No significant change in bibasilar predominant UIP pattern pulmonary fibrosis. Moderate underlying centrilobular and paraseptal emphysema. 5. Cardiomegaly. Gross enlargement of the main pulmonary artery, as can be seen in pulmonary hypertension. 6. Pancolonic diverticulosis without evidence of acute diverticulitis. Aortic Atherosclerosis (ICD10-I70.0) and Emphysema (ICD10-J43.9). Electronically Signed   By: Marolyn JONETTA Jaksch M.D.   On: 11/06/2023 20:13   CT Head Wo Contrast Result Date: 11/06/2023 CLINICAL DATA:  Trauma fall EXAM: CT HEAD AND CERVICAL SPINE WITHOUT CONTRAST CT CHEST, ABDOMEN, AND PELVIS WITH CONTRAST TECHNIQUE: Multidetector CT  imaging of the head and cervical spine was performed following the standard protocol during bolus administration of intravenous contrast. Multidetector CT imaging of the chest, abdomen and pelvis was performed following the standard protocol during bolus administration of intravenous contrast. RADIATION DOSE REDUCTION: This exam was performed according to the departmental dose-optimization program which includes automated exposure control, adjustment of the mA and/or kV according to patient size and/or use of iterative reconstruction technique. CONTRAST:  75mL OMNIPAQUE  IOHEXOL  350 MG/ML SOLN COMPARISON:  07/28/2023 FINDINGS: CT HEAD FINDINGS Brain: No evidence of acute infarction, hemorrhage, hydrocephalus, extra-axial collection or mass lesion/mass effect. Vascular: No hyperdense vessel or unexpected calcification. Skull: Normal. Negative for fracture or focal lesion. Sinuses/Orbits: No acute finding. Other: None. CT CERVICAL SPINE FINDINGS Alignment: Normal. Skull base and vertebrae: No acute fracture. No primary bone lesion or focal pathologic process. Soft tissues and spinal canal: No prevertebral fluid or swelling. No visible canal  hematoma. Disc levels: Generally mild multilevel disc space height loss and osteophytosis throughout the cervical spine with sizable anterior bridging osteophytes, particularly at C5-C6 (series 11, image 39). Upper chest: Negative. Other: None. CT CHEST FINDINGS Cardiovascular: No significant vascular findings. Cardiomegaly. Gross enlargement of the main pulmonary artery measuring up to 4.2 cm in caliber. No pericardial effusion. Mediastinum/Nodes: No enlarged mediastinal, hilar, or axillary lymph nodes. Thyroid  gland, trachea, and esophagus demonstrate no significant findings. Lungs/Pleura: No significant change in bibasilar predominant UIP pattern pulmonary fibrosis moderate underlying centrilobular and paraseptal emphysema. No pleural effusion or pneumothorax. Musculoskeletal: No chest wall abnormality. No acute osseous findings. Disc degenerative disease and bridging osteophytosis throughout the thoracic and upper lumbar spine, in keeping with DISH. Chronic fracture deformity of the sternal body. CT ABDOMEN PELVIS FINDINGS Hepatobiliary: No focal liver abnormality is seen. Status post cholecystectomy. Postoperative pneumobilia. Pancreas: Unremarkable. No pancreatic ductal dilatation or surrounding inflammatory changes. Spleen: Normal in size without significant abnormality. Adrenals/Urinary Tract: Adrenal glands are unremarkable. Kidneys are normal, without renal calculi, solid lesion, or hydronephrosis. Bladder is unremarkable. Stomach/Bowel: Stomach is within normal limits. Appendix appears normal. No evidence of bowel wall thickening, distention, or inflammatory changes. Pancolonic diverticulosis. Vascular/Lymphatic: Aortic atherosclerosis. No enlarged abdominal or pelvic lymph nodes. Reproductive: No mass or other abnormality. Other: No abdominal wall hernia or abnormality. No ascites. Musculoskeletal: No acute osseous findings. IMPRESSION: 1. No acute intracranial pathology. 2. No fracture or static  subluxation of the cervical spine. 3. No CT evidence of acute traumatic injury to the chest, abdomen, or pelvis. 4. No significant change in bibasilar predominant UIP pattern pulmonary fibrosis. Moderate underlying centrilobular and paraseptal emphysema. 5. Cardiomegaly. Gross enlargement of the main pulmonary artery, as can be seen in pulmonary hypertension. 6. Pancolonic diverticulosis without evidence of acute diverticulitis. Aortic Atherosclerosis (ICD10-I70.0) and Emphysema (ICD10-J43.9). Electronically Signed   By: Marolyn JONETTA Jaksch M.D.   On: 11/06/2023 20:13   CT Cervical Spine Wo Contrast Result Date: 11/06/2023 CLINICAL DATA:  Trauma fall EXAM: CT HEAD AND CERVICAL SPINE WITHOUT CONTRAST CT CHEST, ABDOMEN, AND PELVIS WITH CONTRAST TECHNIQUE: Multidetector CT imaging of the head and cervical spine was performed following the standard protocol during bolus administration of intravenous contrast. Multidetector CT imaging of the chest, abdomen and pelvis was performed following the standard protocol during bolus administration of intravenous contrast. RADIATION DOSE REDUCTION: This exam was performed according to the departmental dose-optimization program which includes automated exposure control, adjustment of the mA and/or kV according to patient size and/or use of iterative reconstruction technique. CONTRAST:  75mL OMNIPAQUE  IOHEXOL  350 MG/ML  SOLN COMPARISON:  07/28/2023 FINDINGS: CT HEAD FINDINGS Brain: No evidence of acute infarction, hemorrhage, hydrocephalus, extra-axial collection or mass lesion/mass effect. Vascular: No hyperdense vessel or unexpected calcification. Skull: Normal. Negative for fracture or focal lesion. Sinuses/Orbits: No acute finding. Other: None. CT CERVICAL SPINE FINDINGS Alignment: Normal. Skull base and vertebrae: No acute fracture. No primary bone lesion or focal pathologic process. Soft tissues and spinal canal: No prevertebral fluid or swelling. No visible canal hematoma. Disc  levels: Generally mild multilevel disc space height loss and osteophytosis throughout the cervical spine with sizable anterior bridging osteophytes, particularly at C5-C6 (series 11, image 39). Upper chest: Negative. Other: None. CT CHEST FINDINGS Cardiovascular: No significant vascular findings. Cardiomegaly. Gross enlargement of the main pulmonary artery measuring up to 4.2 cm in caliber. No pericardial effusion. Mediastinum/Nodes: No enlarged mediastinal, hilar, or axillary lymph nodes. Thyroid  gland, trachea, and esophagus demonstrate no significant findings. Lungs/Pleura: No significant change in bibasilar predominant UIP pattern pulmonary fibrosis moderate underlying centrilobular and paraseptal emphysema. No pleural effusion or pneumothorax. Musculoskeletal: No chest wall abnormality. No acute osseous findings. Disc degenerative disease and bridging osteophytosis throughout the thoracic and upper lumbar spine, in keeping with DISH. Chronic fracture deformity of the sternal body. CT ABDOMEN PELVIS FINDINGS Hepatobiliary: No focal liver abnormality is seen. Status post cholecystectomy. Postoperative pneumobilia. Pancreas: Unremarkable. No pancreatic ductal dilatation or surrounding inflammatory changes. Spleen: Normal in size without significant abnormality. Adrenals/Urinary Tract: Adrenal glands are unremarkable. Kidneys are normal, without renal calculi, solid lesion, or hydronephrosis. Bladder is unremarkable. Stomach/Bowel: Stomach is within normal limits. Appendix appears normal. No evidence of bowel wall thickening, distention, or inflammatory changes. Pancolonic diverticulosis. Vascular/Lymphatic: Aortic atherosclerosis. No enlarged abdominal or pelvic lymph nodes. Reproductive: No mass or other abnormality. Other: No abdominal wall hernia or abnormality. No ascites. Musculoskeletal: No acute osseous findings. IMPRESSION: 1. No acute intracranial pathology. 2. No fracture or static subluxation of the  cervical spine. 3. No CT evidence of acute traumatic injury to the chest, abdomen, or pelvis. 4. No significant change in bibasilar predominant UIP pattern pulmonary fibrosis. Moderate underlying centrilobular and paraseptal emphysema. 5. Cardiomegaly. Gross enlargement of the main pulmonary artery, as can be seen in pulmonary hypertension. 6. Pancolonic diverticulosis without evidence of acute diverticulitis. Aortic Atherosclerosis (ICD10-I70.0) and Emphysema (ICD10-J43.9). Electronically Signed   By: Marolyn JONETTA Jaksch M.D.   On: 11/06/2023 20:13   DG Knee Complete 4 Views Left Result Date: 11/06/2023 CLINICAL DATA:  Pain after fall. EXAM: LEFT KNEE - COMPLETE 4+ VIEW COMPARISON:  Radiograph 06/10/2015 FINDINGS: No acute fracture or dislocation. Mild osteoarthritis with joint space narrowing and peripheral spurring. Chondrocalcinosis. Diminutive knee joint effusion. Mild soft tissue edema. IMPRESSION: 1. No acute fracture or dislocation. 2. Mild osteoarthritis and chondrocalcinosis. Electronically Signed   By: Andrea Gasman M.D.   On: 11/06/2023 18:11   DG HIP UNILAT WITH PELVIS 2-3 VIEWS LEFT Result Date: 11/06/2023 CLINICAL DATA:  Pain after fall. EXAM: DG HIP (WITH OR WITHOUT PELVIS) 2-3V LEFT; DG HIP (WITH OR WITHOUT PELVIS) 2-3V RIGHT COMPARISON:  None Available. FINDINGS: Bones are subjectively under mineralized. No acute fracture of the pelvis or hips. No hip dislocation. Pubic rami are intact. No pubic symphyseal or sacroiliac diastasis. Bilateral hip osteoarthritis. IMPRESSION: 1. No acute fracture of the pelvis or hips. 2. Bilateral hip osteoarthritis. Electronically Signed   By: Andrea Gasman M.D.   On: 11/06/2023 18:10   DG HIP UNILAT WITH PELVIS 2-3 VIEWS RIGHT Result Date: 11/06/2023 CLINICAL DATA:  Pain after fall.  EXAM: DG HIP (WITH OR WITHOUT PELVIS) 2-3V LEFT; DG HIP (WITH OR WITHOUT PELVIS) 2-3V RIGHT COMPARISON:  None Available. FINDINGS: Bones are subjectively under mineralized. No  acute fracture of the pelvis or hips. No hip dislocation. Pubic rami are intact. No pubic symphyseal or sacroiliac diastasis. Bilateral hip osteoarthritis. IMPRESSION: 1. No acute fracture of the pelvis or hips. 2. Bilateral hip osteoarthritis. Electronically Signed   By: Andrea Gasman M.D.   On: 11/06/2023 18:10   DG Chest Portable 1 View Result Date: 11/06/2023 CLINICAL DATA:  Pain after fall.  Patient reports left rib pain. EXAM: PORTABLE CHEST 1 VIEW COMPARISON:  Radiograph 05/20/2023.  CT 08/13/2022 FINDINGS: Cardiomegaly is stable. Unchanged mediastinal contours. Interstitial lung disease with subpleural reticulation, greatest in the left lung base. No pneumothorax, pleural effusion or focal opacity. There are remote left rib fractures. No obvious acute left rib fracture. IMPRESSION: 1. No acute findings. 2. Interstitial lung disease. 3. Remote left rib fractures. No obvious acute left rib fracture. Electronically Signed   By: Andrea Gasman M.D.   On: 11/06/2023 18:01     Procedures   Medications Ordered in the ED  morphine  (PF) 4 MG/ML injection 4 mg (4 mg Intravenous Patient Refused/Not Given 11/06/23 2012)  ondansetron  (ZOFRAN ) injection 4 mg (4 mg Intravenous Patient Refused/Not Given 11/06/23 2012)  oxyCODONE -acetaminophen  (PERCOCET/ROXICET) 5-325 MG per tablet 1 tablet (1 tablet Oral Given 11/06/23 1831)  iohexol  (OMNIPAQUE ) 350 MG/ML injection 75 mL (75 mLs Intravenous Contrast Given 11/06/23 1956)    Clinical Course as of 11/06/23 2050  Sat Nov 06, 2023  1841 I-stat chem 8, ED (not at [facility], [facility] or [facility](!) No significant abnormalities [JK]  1841 X-ray without signs of fracture [JK]  1841 No hip fracture noted [JK]  1841 Chest x-ray does not show any obvious left rib fracture [JK]  2034 CT scan does not show any signs of head or C-spine injury.  There is no evidence of rib fracture or abdominal injury or other significant injury [JK]    Clinical Course User Index [JK] Randol Simmonds, MD                                 Medical Decision Making Problems Addressed: Contusion of chest wall, unspecified laterality, initial encounter: acute illness or injury that poses a threat to life or bodily functions Fall, initial encounter: acute illness or injury that poses a threat to life or bodily functions  Amount and/or Complexity of Data Reviewed Labs: ordered. Decision-making details documented in ED Course. Radiology: ordered and independent interpretation performed.  Risk Prescription drug management.   Patient presented to the ED for evaluation of injuries sustained after a fall.  Patient primarily having pain in her chest wall.  I was concerned about the possibility of rib fracture or pneumothorax retroperitoneal hematoma renal injury.  Patient's x-rays fortunately do not show any signs of any serious injury.  No fracture.  No pneumothorax.  No intra-abdominal injury.  Patient was given a dose of oxycodone  for pain.  She is feeling better.  She is requesting a prescription of oxycodone  for her pain.  Discussed precautions of opiate pain medications will give a short prescription.  Recommend Tylenol  lidocaine  patches and reserving the oxycodone  for more severe pain.     Final diagnoses:  Fall, initial encounter  Contusion of chest wall, unspecified laterality, initial encounter    ED Discharge Orders  Ordered    oxyCODONE -acetaminophen  (PERCOCET/ROXICET) 5-325 MG tablet  Every 6 hours PRN        11/06/23 2047    lidocaine  (LIDODERM ) 5 %  Every 24 hours        11/06/23 2047               Randol Simmonds, MD 11/06/23 2050

## 2023-11-06 NOTE — Discharge Instructions (Addendum)
 The x-rays and CT scans did not show any signs of fracture or internal injury.  Do expect to be stiff and sore for the next few days.  I recommend taking Tylenol  and use lidocaine  patches for your pain.  The oxycodone  can be reserved for more severe pain.  Oxycodone  can cause confusion as well as constipation.  I would recommend taking a stool softener while taking that medication.

## 2023-11-10 ENCOUNTER — Other Ambulatory Visit: Payer: Self-pay

## 2023-11-10 ENCOUNTER — Other Ambulatory Visit: Payer: Self-pay | Admitting: Student in an Organized Health Care Education/Training Program

## 2023-11-10 DIAGNOSIS — I5022 Chronic systolic (congestive) heart failure: Secondary | ICD-10-CM

## 2023-11-10 DIAGNOSIS — K219 Gastro-esophageal reflux disease without esophagitis: Secondary | ICD-10-CM

## 2023-11-10 MED ORDER — EMPAGLIFLOZIN 10 MG PO TABS: 10.0000 mg | ORAL_TABLET | Freq: Every day | ORAL | 2 refills | Status: AC

## 2023-11-10 NOTE — Telephone Encounter (Signed)
 Medication sent to pharmacy

## 2023-11-12 ENCOUNTER — Telehealth: Payer: Self-pay | Admitting: *Deleted

## 2023-11-12 NOTE — Progress Notes (Signed)
 Complex Care Management Care Guide Note  11/12/2023 Name: Caitlyn Aguirre MRN: [id number] DOB: 07-Aug-1935  [name] is a 88 y.o. year old female who is a primary care patient of Shawn Sick, MD and is actively engaged with the care management team. I reached out to [name] by phone today to assist with re-scheduling  with the RN Case Manager.  Follow up plan: Unsuccessful telephone outreach attempt made. A HIPAA compliant phone message was left for the patient providing contact information and requesting a return call.  Thedford Franks, CMA [place]  [facility], [facility] Guide Direct Dial: [phone]  Fax: [phone] Website: [email or web address]

## 2023-11-16 NOTE — Progress Notes (Signed)
 Complex Care Management Care Guide Note  11/16/2023 Name: Caitlyn Aguirre MRN: [id number] DOB: May 03, 1935  Caitlyn Aguirre is a 88 y.o. year old female who is a primary care patient of Shawn Sick, MD and is actively engaged with the care management team. I reached out to Canda S Reif by phone today to assist with re-scheduling  with the RN Case Manager.  Follow up plan: Unsuccessful telephone outreach attempt made. A HIPAA compliant phone message was left for the patient providing contact information and requesting a return call. No further outreach attempts will be made due to inability to maintain patient contact.   Thedford Franks, CMA [place]  [facility], [facility] Guide Direct Dial: [phone]  Fax: [phone] Website: [email or web address]

## 2023-11-24 ENCOUNTER — Other Ambulatory Visit: Payer: Self-pay | Admitting: Student in an Organized Health Care Education/Training Program

## 2023-11-25 ENCOUNTER — Ambulatory Visit: Payer: Self-pay

## 2023-11-25 NOTE — Telephone Encounter (Signed)
 FYI Only or Action Required?: FYI only for provider.  Patient was last seen in primary care on 09/07/2023 by Norrine Sharper, MD.  Called Nurse Triage reporting Dizziness.  Symptoms began several months ago.  Interventions attempted: Rest, hydration, or home remedies.  Symptoms are: unchanged.  Triage Disposition: See Physician Within 24 Hours  Patient/caregiver understands and will follow disposition?: Yes Copied from CRM #[id number]. Topic: Clinical - Red Word Triage >> Nov 25, 2023  3:03 PM Cherylann RAMAN wrote: Red Word that prompted transfer to Nurse Triage: Patient called in with complaints of dizziness. Patient that states that when she gets really lightheaded. When she begins to walk around her vision gets blurry and she is really lightheaded. She states that symptoms has been getting worse over the last month or so. Reason for Disposition  [1] MODERATE dizziness (e.g., interferes with normal activities) AND [2] has NOT been evaluated by doctor (or NP/PA) for this  (Exception: Dizziness caused by heat exposure, sudden standing, or poor fluid intake.)  Answer Assessment - Initial Assessment Questions Would like to talk about a lighter oxygen  tank  1. DESCRIPTION: Describe your dizziness.     Dizzy and light headed especially on standing and if she bends over       4. SEVERITY: How bad is it?  Do you feel like you are going to faint? Can you stand and walk?     Able to walk, but sometimes gets dizzy when walking 5. ONSET:  When did the dizziness begin?     3-4 months ago 6. AGGRAVATING FACTORS: Does anything make it worse? (e.g., standing, change in head position)     Sit to stand, bending over  8. CAUSE: What do you think is causing the dizziness? (e.g., decreased fluids or food, diarrhea, emotional distress, heat exposure, new medicine, sudden standing, vomiting; unknown)     unknown 9. RECURRENT SYMPTOM: Have you had dizziness before? If Yes, ask: When was the  last time? What happened that time?     denies 10. OTHER SYMPTOMS: Do you have any other symptoms? (e.g., fever, chest pain, vomiting, diarrhea, bleeding)       Denies any increased SOB States that she passed out recently and had to go to the hospital 11. PREGNANCY: Is there any chance you are pregnant? When was your last menstrual period?       N/a  Protocols used: Dizziness - Lightheadedness-A-AH

## 2023-11-26 ENCOUNTER — Ambulatory Visit: Payer: Self-pay | Admitting: Student

## 2023-11-26 ENCOUNTER — Ambulatory Visit (INDEPENDENT_AMBULATORY_CARE_PROVIDER_SITE_OTHER): Admitting: Student

## 2023-11-26 ENCOUNTER — Encounter: Payer: Self-pay | Admitting: Student

## 2023-11-26 VITALS — BP 123/81 | HR 93 | Temp 97.6°F | Wt 175.2 lb

## 2023-11-26 DIAGNOSIS — I5022 Chronic systolic (congestive) heart failure: Secondary | ICD-10-CM | POA: Diagnosis not present

## 2023-11-26 DIAGNOSIS — Z9981 Dependence on supplemental oxygen: Secondary | ICD-10-CM | POA: Diagnosis not present

## 2023-11-26 DIAGNOSIS — Z87891 Personal history of nicotine dependence: Secondary | ICD-10-CM

## 2023-11-26 DIAGNOSIS — Z79899 Other long term (current) drug therapy: Secondary | ICD-10-CM

## 2023-11-26 DIAGNOSIS — E1169 Type 2 diabetes mellitus with other specified complication: Secondary | ICD-10-CM

## 2023-11-26 DIAGNOSIS — Z7189 Other specified counseling: Secondary | ICD-10-CM

## 2023-11-26 DIAGNOSIS — Z833 Family history of diabetes mellitus: Secondary | ICD-10-CM

## 2023-11-26 DIAGNOSIS — J849 Interstitial pulmonary disease, unspecified: Secondary | ICD-10-CM

## 2023-11-26 DIAGNOSIS — Z7984 Long term (current) use of oral hypoglycemic drugs: Secondary | ICD-10-CM

## 2023-11-26 LAB — POCT GLYCOSYLATED HEMOGLOBIN (HGB A1C): HbA1c, POC (controlled diabetic range): 6.2 % (ref 0.0–7.0)

## 2023-11-26 LAB — GLUCOSE, CAPILLARY: Glucose-Capillary: 129 mg/dL — ABNORMAL HIGH (ref 70–99)

## 2023-11-26 NOTE — Patient Instructions (Signed)
 It was a pleasure taking care of you today!    Please take lasix  40 mg for the next 7 days.Follow up with in a week. - Weigh yourself daily.     Lab Orders         Glucose, capillary         POC Hbg A1C       Follow up: 1 week fu    Should you have any questions or concerns please call the internal medicine clinic at [phone].     Missy Sandhoff, MD  [facility]

## 2023-11-26 NOTE — Assessment & Plan Note (Addendum)
 Reports increased leg swelling and dyspnea. She was previously advised to take Lasix  40 mg every other day and an extra dose for >3 lb/day or >5 lb/week weight gain but has not been weighing herself due to a broken scale. Currently taking Lasix  twice weekly.  On exam: mild JVD, bilateral edema, stable on 2 L O? (baseline). Likely acute on chronic HFrEF. - Start Lasix  40 mg PO daily  7 days - Check BMP - Reinforce daily weights once scale replaced
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
## 2023-11-28 NOTE — Assessment & Plan Note (Signed)
 Follows with Pulm, family wants to know if she will qualify for small oxygen  tank. The current o2 Follows with Pulmonology. Family inquiring if she qualifies for a smaller, portable oxygen  tank, as her current setup is too heavy to carry. - Will place a referral to DME.

## 2023-11-28 NOTE — Assessment & Plan Note (Signed)
 Ongoing discussions about goals of care, this was started by Dr. Norrine on 07/30.  Unable to follow through at this visit due to constraint of time, will follow through with the next office visit in a week.

## 2023-11-28 NOTE — Assessment & Plan Note (Signed)
 Well-controlled type 2 diabetes.  Will continue current regimen of metformin  1000 mg daily and Jardiance  10 mg daily

## 2023-11-29 ENCOUNTER — Ambulatory Visit: Payer: Self-pay

## 2023-12-02 NOTE — Progress Notes (Signed)
 Internal Medicine Clinic Attending  Case discussed with the resident at the time of the visit.  We reviewed the resident's history and exam and pertinent patient test results.  I agree with the assessment, diagnosis, and plan of care documented in the resident's note.

## 2023-12-03 ENCOUNTER — Other Ambulatory Visit: Payer: Self-pay

## 2023-12-03 ENCOUNTER — Ambulatory Visit: Admitting: Student

## 2023-12-03 VITALS — BP 128/78 | HR 93 | Temp 97.5°F | Ht 69.0 in | Wt 166.6 lb

## 2023-12-03 DIAGNOSIS — F32A Depression, unspecified: Secondary | ICD-10-CM | POA: Diagnosis not present

## 2023-12-03 DIAGNOSIS — I5022 Chronic systolic (congestive) heart failure: Secondary | ICD-10-CM | POA: Diagnosis not present

## 2023-12-03 DIAGNOSIS — Z79899 Other long term (current) drug therapy: Secondary | ICD-10-CM | POA: Diagnosis not present

## 2023-12-03 MED ORDER — METFORMIN HCL 1000 MG PO TABS: 1000.0000 mg | ORAL_TABLET | Freq: Every day | ORAL | 3 refills | Status: AC

## 2023-12-03 MED ORDER — PAROXETINE HCL 30 MG PO TABS
30.0000 mg | ORAL_TABLET | Freq: Every day | ORAL | 3 refills | Status: DC
  Filled 2023-12-03: qty 90, 90d supply, fill #0
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
## 2023-12-03 NOTE — Assessment & Plan Note (Addendum)
 Patient reports improvement in shortness of breath, and lower extremity edema.. She has been compliant with furosemide  40 mg daily, her dry weight is 165 lbs. Plan:  - Will check BMP today. - Furosemide  40 mg every other day -Take an extra 40 mg for weight gain >=3 lbs in 24 hours or >=5 lbs in one week, worsening leg swelling, or increased dyspnea. - Monitoring: Check and record daily weights; contact clinic for rapid weight gain or progressive symptoms.

## 2023-12-03 NOTE — Patient Instructions (Addendum)
 It was a pleasure taking care of you today!    1.  Please take Lasix  40 mg every other day.   2.  Continue to check your weight every day.  Please take an additional Lasix  40 mg for weight gain of 3 pounds in a day, or 5 pounds in a week.  3.  I will call you the results of your lab.  I have ordered the following labs for you:   Lab Orders         Basic metabolic panel with GFR       Follow up: 3 months   Should you have any questions or concerns please call the internal medicine clinic at [phone].     Missy Sandhoff, MD  [facility]

## 2023-12-04 LAB — BASIC METABOLIC PANEL WITH GFR
BUN/Creatinine Ratio: 15 (ref 12–28)
BUN: 26 mg/dL (ref 8–27)
CO2: 19 mmol/L — ABNORMAL LOW (ref 20–29)
Calcium: 10.4 mg/dL — ABNORMAL HIGH (ref 8.7–10.3)
Chloride: 106 mmol/L (ref 96–106)
Creatinine, Ser: 1.68 mg/dL — ABNORMAL HIGH (ref 0.57–1.00)
Glucose: 100 mg/dL — ABNORMAL HIGH (ref 70–99)
Potassium: 4.4 mmol/L (ref 3.5–5.2)
Sodium: 144 mmol/L (ref 134–144)
eGFR: 29 mL/min/1.73 — ABNORMAL LOW (ref >59)

## 2023-12-04 NOTE — Assessment & Plan Note (Signed)
 Denies depressed mood, refill for paroxetine  sent.

## 2023-12-05 ENCOUNTER — Ambulatory Visit: Payer: Self-pay | Admitting: Student

## 2023-12-05 NOTE — Progress Notes (Signed)
 BMP with normal electrolytes, mildly increased SCr(1.60>>1.68) as expected with diuresis, but stable.

## 2023-12-10 NOTE — Progress Notes (Signed)
 Internal Medicine Clinic Attending  Case discussed with the resident at the time of the visit.  We reviewed the resident's history and exam and pertinent patient test results.  I agree with the assessment, diagnosis, and plan of care documented in the resident's note.

## 2023-12-23 ENCOUNTER — Telehealth: Payer: Self-pay

## 2023-12-23 DIAGNOSIS — K219 Gastro-esophageal reflux disease without esophagitis: Secondary | ICD-10-CM

## 2023-12-23 DIAGNOSIS — I5022 Chronic systolic (congestive) heart failure: Secondary | ICD-10-CM

## 2023-12-23 DIAGNOSIS — J449 Chronic obstructive pulmonary disease, unspecified: Secondary | ICD-10-CM

## 2023-12-23 NOTE — Telephone Encounter (Signed)
 Copied from CRM [id number]. Topic: Clinical - Prescription Issue >> Dec 23, 2023  4:45 PM Caitlyn Aguirre wrote: Reason for CRM: Patient is calling in stating that she is almost out of both of her inhalers, water pills and other medications. Patient stated the doctor was suppose to order all her medications at her appointment on 10/31 through mail order and she has not received any of those medications that were ordered. Patients stated she is very worried about both inhalers running out.

## 2023-12-24 MED ORDER — FUROSEMIDE 40 MG PO TABS: 40.0000 mg | ORAL_TABLET | ORAL | 3 refills | Status: DC

## 2023-12-24 MED ORDER — OMEPRAZOLE 20 MG PO CPDR: 20.0000 mg | DELAYED_RELEASE_CAPSULE | Freq: Every day | ORAL | 3 refills | Status: AC

## 2023-12-24 MED ORDER — MAGNESIUM OXIDE -MG SUPPLEMENT 400 (240 MG) MG PO TABS: 400.0000 mg | ORAL_TABLET | Freq: Every day | ORAL | 2 refills | Status: AC

## 2023-12-24 MED ORDER — TIOTROPIUM BROMIDE 18 MCG IN CAPS: 18.0000 ug | ORAL_CAPSULE | Freq: Every day | RESPIRATORY_TRACT | 2 refills | Status: DC

## 2023-12-24 MED ORDER — ALBUTEROL SULFATE HFA 108 (90 BASE) MCG/ACT IN AERS: 1.0000 | INHALATION_SPRAY | Freq: Four times a day (QID) | RESPIRATORY_TRACT | 5 refills | Status: DC | PRN

## 2024-01-12 ENCOUNTER — Other Ambulatory Visit: Payer: Self-pay | Admitting: Student in an Organized Health Care Education/Training Program

## 2024-01-12 DIAGNOSIS — R10A1 Flank pain, right side: Secondary | ICD-10-CM

## 2024-01-17 ENCOUNTER — Other Ambulatory Visit: Payer: Self-pay

## 2024-01-17 DIAGNOSIS — M7918 Myalgia, other site: Secondary | ICD-10-CM

## 2024-01-17 DIAGNOSIS — I5022 Chronic systolic (congestive) heart failure: Secondary | ICD-10-CM

## 2024-01-17 MED ORDER — SACUBITRIL-VALSARTAN 49-51 MG PO TABS: 1.0000 | ORAL_TABLET | Freq: Two times a day (BID) | ORAL | 2 refills | Status: DC

## 2024-01-17 NOTE — Telephone Encounter (Signed)
 Medication sent to pharmacy

## 2024-01-25 ENCOUNTER — Other Ambulatory Visit: Payer: Self-pay | Admitting: Student in an Organized Health Care Education/Training Program

## 2024-01-25 DIAGNOSIS — R10A1 Flank pain, right side: Secondary | ICD-10-CM

## 2024-01-25 DIAGNOSIS — F32A Depression, unspecified: Secondary | ICD-10-CM

## 2024-01-25 MED ORDER — ATORVASTATIN CALCIUM 40 MG PO TABS: 40.0000 mg | ORAL_TABLET | Freq: Every day | ORAL | 3 refills | Status: AC

## 2024-01-25 NOTE — Addendum Note (Signed)
 Addended by: Woodie Degraffenreid C on: 01/25/2024 11:34 AM   Modules accepted: Orders

## 2024-02-06 ENCOUNTER — Other Ambulatory Visit: Payer: Self-pay

## 2024-02-06 DIAGNOSIS — J449 Chronic obstructive pulmonary disease, unspecified: Secondary | ICD-10-CM

## 2024-02-07 NOTE — Telephone Encounter (Signed)
 Medication sent to pharmacy.  Patient last seen 12/03/23 I called the patient to schedule a appointment. I was unable to reach the patient. I lvm for her to give us  a call back.

## 2024-02-08 ENCOUNTER — Telehealth: Admitting: *Deleted

## 2024-02-08 ENCOUNTER — Encounter: Payer: Self-pay | Admitting: *Deleted

## 2024-02-08 NOTE — Patient Outreach (Signed)
 Complex Care Management   Visit Note  02/08/2024  Name:  Caitlyn Aguirre MRN: [id number] DOB: 12-04-1935  Situation: RNCM spoke with pt briefly today for an assessment pt indicated she was with her family and requested a call back.  RNCM will reschedule with Rosaline Finlay, [facility] 02/14/2024 @ 2:30 PM.   Olam Ku, RN, BSN [facility], [facility] RN Care Manager Direct Dial: [phone]  Fax: [phone]

## 2024-02-09 ENCOUNTER — Telehealth: Payer: Self-pay | Admitting: *Deleted

## 2024-02-09 DIAGNOSIS — I5022 Chronic systolic (congestive) heart failure: Secondary | ICD-10-CM

## 2024-02-09 NOTE — Telephone Encounter (Signed)
 Will froward to Attending Pool to hava e doctor call and to speak with th daughter.                                             Copied from CRM #[id number]. Topic: Clinical - Medication Question >> Feb 09, 2024  2:20 PM Susanna ORN wrote: Reason for CRM: Patient's daughter, [name], called in stating that the nurse came by today & stated that the patient does not have any blood pressure medication. [name] wants to know why has patient been taken off of her blood pressure medication. Please give her a call back to advise. CB #: [phone]

## 2024-02-10 MED ORDER — SACUBITRIL-VALSARTAN 49-51 MG PO TABS: 1.0000 | ORAL_TABLET | Freq: Two times a day (BID) | ORAL | 2 refills | Status: AC

## 2024-02-10 MED ORDER — FUROSEMIDE 40 MG PO TABS: 40.0000 mg | ORAL_TABLET | ORAL | 3 refills | Status: DC

## 2024-02-10 MED ORDER — AMLODIPINE BESYLATE 5 MG PO TABS: 5.0000 mg | ORAL_TABLET | Freq: Every day | ORAL | 2 refills | Status: AC

## 2024-02-14 ENCOUNTER — Other Ambulatory Visit: Payer: Self-pay
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
## 2024-02-14 NOTE — Patient Instructions (Signed)
 Visit Information  Thank you for taking time to visit with me today. Please don't hesitate to contact me if I can be of assistance to you before our next scheduled appointment.  Your next care management appointment is no further scheduled appointments.    Closing From: Complex Care Management. Patient has met all care management goals. Care Management case will be closed. Patient has been provided contact information should new needs arise.   Please call the care guide team at [phone] if you need to cancel, schedule, or reschedule an appointment.   Please call the Suicide and Crisis Lifeline: 988 call 1-800-273-TALK (toll free, 24 hour hotline) if you are experiencing a Mental Health or Behavioral Health Crisis or need someone to talk to.  Rosaline Finlay, RN MSN [place]  [facility] Population [facility] RN Care Manager Direct Dial: [phone]  Fax: [phone]  Following is a copy of your care plan:   Goals Addressed             This Visit's Progress    COMPLETED: [facility] RN Care Plan-HF   On track    Problems:  Chronic Disease Management support and education needs related to CHF  Goal: Over the next 30 days the Patient will demonstrate Ongoing adherence to prescribed treatment plan for CHF as evidenced by patient report of stable weight and no symptoms of fluid retention, not needing extra dose of furosemide  verbalize basic understanding of CHF disease process and self health management plan as evidenced by verbal explanation of lifestyle changes and consistent medication compliance   Interventions:   Heart Failure Interventions: Provided education on low sodium diet Reviewed Heart Failure Action Plan in depth and provided written copy Assessed need for readable accurate scales in home Discussed importance of daily weight and advised patient to weigh and record daily Reviewed role of diuretics in prevention of fluid overload and management of heart  failure; Discussed the importance of keeping all appointments with provider Elevate your legs when able Continue to wear compression socks  Patient Self-Care Activities:  Attend all scheduled provider appointments Call pharmacy for medication refills 3-7 days in advance of running out of medications Call provider office for new concerns or questions  Perform all self care activities independently  Take medications as prescribed   call office if I gain more than 2 pounds in one day or 5 pounds in one week use salt in moderation watch for swelling in feet, ankles and legs every day weigh myself daily  Plan:  No further follow up required: Patient has met care plan goals             Patient verbalized understanding of Care plan and visit instructions communicated this visit

## 2024-02-15 ENCOUNTER — Ambulatory Visit: Payer: Self-pay

## 2024-02-15 VITALS — BP 128/69 | HR 85 | Temp 97.4°F | Ht 69.0 in | Wt 167.2 lb

## 2024-02-15 DIAGNOSIS — E1122 Type 2 diabetes mellitus with diabetic chronic kidney disease: Secondary | ICD-10-CM

## 2024-02-15 DIAGNOSIS — I5022 Chronic systolic (congestive) heart failure: Secondary | ICD-10-CM

## 2024-02-15 DIAGNOSIS — Z7984 Long term (current) use of oral hypoglycemic drugs: Secondary | ICD-10-CM

## 2024-02-15 DIAGNOSIS — E1169 Type 2 diabetes mellitus with other specified complication: Secondary | ICD-10-CM

## 2024-02-15 DIAGNOSIS — J449 Chronic obstructive pulmonary disease, unspecified: Secondary | ICD-10-CM | POA: Diagnosis not present

## 2024-02-15 DIAGNOSIS — N1832 Chronic kidney disease, stage 3b: Secondary | ICD-10-CM

## 2024-02-15 NOTE — Assessment & Plan Note (Signed)
 Diabetic foot exam completed today.  Her last A1c was 6.6 in 11/26/2023.  Too early to recheck at this visit, plan to recheck at 100-month follow-up. Plan A1c at next visit Continue metformin  1000 mg twice daily Continue atorvastatin  40 mg daily Diabetic eye exam due in May 2026

## 2024-02-15 NOTE — Assessment & Plan Note (Signed)
 Stable and managed with Spiriva  and albuterol  as needed.  Denies change in sputum production, exam negative for wheeze. Plan Continue current regimen Patient denied the flu vaccine

## 2024-02-15 NOTE — Assessment & Plan Note (Signed)
 Patient and her daughter querying whether or not they need to be referred to a nephrologist.  Shared decision making to wait and see the results of her BMP today before placing a referral.  GFR 1 month ago was 29 and 5 months ago was 38.  Discussed that if her GFR is below 30 and she is indeed CKD stage IV, will place referral to Dr. Marlee at [facility]. Plan Pending BMP, may need referral to nephrology

## 2024-02-15 NOTE — Patient Instructions (Addendum)
 It was wonderful seeing you today!   Please remember to...  1) Keep taking your medicine as prescribed  -Take Lasix  40 mg for weight gain >=3 lbs in 24 hours or >=5 lbs in one week, worsening leg swelling, or increased dyspnea.   2) I will let you know the results of your labs and if we need to change anything or place a referral to the kidney Doctor   If you have any questions please feel free to the call the clinic at anytime at [phone].  Have a blessed day,  Dr. Charmayne

## 2024-02-15 NOTE — Assessment & Plan Note (Signed)
 Current regimen includes empagliflozin  10, sacubitril -valsartan  49-51 and furosemide  40 mg as needed.  Per chart, her dry weight is 165 pounds.  Today she is 167.  BP 128/69, heart rate 85.  Patient feels well, denies shortness of breath.  She has some mild pitting edema and so she took her as needed Lasix  40 mg yesterday and she is going to take another dose today.  Discussed importance of adhering to her medications and eating a diet low in salt and sugar.  Encouraged patient to maintain adequate hydration despite trying to get fluid off with Lasix .  Patient has refills ordered for all of her medications. Plan Repeat BMP today Continue current regimen May need to add potassium supplementation, pending BMP Orders:   Basic metabolic panel with GFR
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
## 2024-02-16 LAB — BASIC METABOLIC PANEL WITH GFR
BUN/Creatinine Ratio: 14 (ref 12–28)
BUN: 18 mg/dL (ref 8–27)
CO2: 15 mmol/L — ABNORMAL LOW (ref 20–29)
Calcium: 10.4 mg/dL — ABNORMAL HIGH (ref 8.7–10.3)
Chloride: 107 mmol/L — ABNORMAL HIGH (ref 96–106)
Creatinine, Ser: 1.29 mg/dL — ABNORMAL HIGH (ref 0.57–1.00)
Glucose: 93 mg/dL (ref 70–99)
Potassium: 4.5 mmol/L (ref 3.5–5.2)
Sodium: 139 mmol/L (ref 134–144)
eGFR: 40 mL/min/1.73 — ABNORMAL LOW

## 2024-02-16 NOTE — Progress Notes (Signed)
 Internal Medicine Clinic Attending  Case discussed with the resident at the time of the visit.  We reviewed the resident's history and exam and pertinent patient test results.  I agree with the assessment, diagnosis, and plan of care documented in the resident's note.

## 2024-02-17 ENCOUNTER — Ambulatory Visit: Payer: Self-pay

## 2024-02-24 ENCOUNTER — Other Ambulatory Visit: Payer: Self-pay

## 2024-02-24 NOTE — Telephone Encounter (Signed)
 Medication sent to pharmacy

## 2024-03-04 ENCOUNTER — Other Ambulatory Visit: Payer: Self-pay

## 2024-03-04 DIAGNOSIS — I5022 Chronic systolic (congestive) heart failure: Secondary | ICD-10-CM

## 2024-03-07 NOTE — Telephone Encounter (Signed)
 Medication sent to pharmacy

## 2024-10-04 ENCOUNTER — Ambulatory Visit
